# Patient Record
Sex: Male | Born: 1950
Health system: Southern US, Community
[De-identification: ages and names within clinical notes are randomized; demographics above are authoritative.]

## PROBLEM LIST (undated history)

## (undated) DIAGNOSIS — C7931 Secondary malignant neoplasm of brain: Secondary | ICD-10-CM

## (undated) DIAGNOSIS — I10 Essential (primary) hypertension: Secondary | ICD-10-CM

## (undated) DIAGNOSIS — G454 Transient global amnesia: Secondary | ICD-10-CM

## (undated) DIAGNOSIS — C3490 Malignant neoplasm of unspecified part of unspecified bronchus or lung: Secondary | ICD-10-CM

## (undated) DIAGNOSIS — R030 Elevated blood-pressure reading, without diagnosis of hypertension: Secondary | ICD-10-CM

## (undated) DIAGNOSIS — N529 Male erectile dysfunction, unspecified: Secondary | ICD-10-CM

## (undated) DIAGNOSIS — H43812 Vitreous degeneration, left eye: Secondary | ICD-10-CM

## (undated) DIAGNOSIS — G43909 Migraine, unspecified, not intractable, without status migrainosus: Secondary | ICD-10-CM

## (undated) DIAGNOSIS — I82409 Acute embolism and thrombosis of unspecified deep veins of unspecified lower extremity: Secondary | ICD-10-CM

## (undated) DIAGNOSIS — Z961 Presence of intraocular lens: Secondary | ICD-10-CM

## (undated) DIAGNOSIS — E785 Hyperlipidemia, unspecified: Secondary | ICD-10-CM

## (undated) DIAGNOSIS — H27112 Subluxation of lens, left eye: Secondary | ICD-10-CM

## (undated) DIAGNOSIS — R0600 Dyspnea, unspecified: Secondary | ICD-10-CM

## (undated) HISTORY — DX: Hyperlipidemia, unspecified: E78.5

## (undated) HISTORY — DX: Vitreous degeneration, left eye: H43.812

## (undated) HISTORY — DX: Male erectile dysfunction, unspecified: N52.9

## (undated) HISTORY — PX: OTHER SURGICAL HISTORY: SHX169

## (undated) HISTORY — DX: Presence of intraocular lens: Z96.1

## (undated) HISTORY — DX: Subluxation of lens, left eye: H27.112

## (undated) HISTORY — PX: LUMBAR LAMINECTOMY: SHX95

## (undated) HISTORY — DX: Transient global amnesia: G45.4

## (undated) HISTORY — PX: CATARACT EXTRACTION, BILATERAL: SHX1313

## (undated) HISTORY — DX: Elevated blood-pressure reading, without diagnosis of hypertension: R03.0

## (undated) HISTORY — PX: HERNIA REPAIR: SHX51

## (undated) HISTORY — PX: FEMORAL HERNIA REPAIR: SHX632

## (undated) HISTORY — DX: Migraine, unspecified, not intractable, without status migrainosus: G43.909

---

## 2014-08-24 HISTORY — PX: COLONOSCOPY: SHX174

## 2015-08-25 DIAGNOSIS — G454 Transient global amnesia: Secondary | ICD-10-CM

## 2015-08-25 HISTORY — DX: Transient global amnesia: G45.4

## 2015-10-17 ENCOUNTER — Encounter: Payer: Self-pay | Admitting: Cardiology

## 2015-10-18 ENCOUNTER — Encounter: Payer: Self-pay | Admitting: Cardiology

## 2016-08-24 HISTORY — PX: OTHER SURGICAL HISTORY: SHX169

## 2016-09-15 DIAGNOSIS — Z961 Presence of intraocular lens: Secondary | ICD-10-CM | POA: Diagnosis not present

## 2016-09-15 DIAGNOSIS — H43812 Vitreous degeneration, left eye: Secondary | ICD-10-CM | POA: Diagnosis not present

## 2016-09-15 DIAGNOSIS — H04123 Dry eye syndrome of bilateral lacrimal glands: Secondary | ICD-10-CM | POA: Diagnosis not present

## 2016-10-08 ENCOUNTER — Encounter: Payer: Self-pay | Admitting: Cardiology

## 2016-10-08 DIAGNOSIS — G454 Transient global amnesia: Secondary | ICD-10-CM | POA: Diagnosis not present

## 2016-10-08 DIAGNOSIS — Z136 Encounter for screening for cardiovascular disorders: Secondary | ICD-10-CM | POA: Diagnosis not present

## 2016-10-08 DIAGNOSIS — E785 Hyperlipidemia, unspecified: Secondary | ICD-10-CM | POA: Diagnosis not present

## 2016-10-08 DIAGNOSIS — R030 Elevated blood-pressure reading, without diagnosis of hypertension: Secondary | ICD-10-CM | POA: Diagnosis not present

## 2016-10-31 ENCOUNTER — Inpatient Hospital Stay
Admit: 2016-10-31 | Discharge: 2016-10-31 | Disposition: A | Discharge status: Home / Self Care | Attending: Emergency Medicine

## 2016-10-31 DIAGNOSIS — H539 Unspecified visual disturbance: Secondary | ICD-10-CM | POA: Diagnosis not present

## 2016-10-31 DIAGNOSIS — H538 Other visual disturbances: Secondary | ICD-10-CM | POA: Diagnosis not present

## 2016-10-31 NOTE — Discharge Instructions (Signed)
-   As discussed, take an aspirin when you get home

## 2016-10-31 NOTE — ED Provider Notes (Signed)
[facility]  eMERGENCY dEPARTMENT eNCOUnter   Physician Attestation    Pt Name: Donald Bruce  MRN: [id number]  Birthdate 1951/04/30  Date of evaluation: 10/31/16        Physician Note:    I have personally performed and/or participated in the history, exam and medical decision making and agree with all pertinent clinical information.  I have also reviewed and agree with the past medical, family and social history unless otherwise noted.    I have personally performed a face to face diagnostic evaluation on this patient. I have reviewed the mid-level's findings and agree.      History: Patient presents with painless sudden impaired vision in the right eye.  Remote history of intraocular lens placement.  Also remote history of retinal detachment with surgical repair.    Physical Exam   Constitutional: He is oriented to person, place, and time. He appears well-developed and well-nourished.   HENT:   Head: Normocephalic and atraumatic.   Right Ear: External ear normal.   Left Ear: External ear normal.   Eyes: Conjunctivae are normal. Right eye exhibits no discharge. Left eye exhibits no discharge. No scleral icterus.   Patient has a mobile vacillating right iris.  He does not have a Page Spiro.  In the dilated state he does have some component of anisocoria with the left pupil being about 6 mm on the right at 4.  Visual acuity is 20/200 in the right and 20/13 in the left.  I am able to visualize the fundi.  I do not see evidence of central retinal artery occlusion or large central retinal vein occlusion.  This is done on an on dilated exam.  I also did a bedside ultrasound.  The intraocular lens appears to be intact.  I did not see any obvious signs of a retinal detachment.   Neck: Normal range of motion. No tracheal deviation present.   Pulmonary/Chest: Effort normal. No stridor. No respiratory distress.   Musculoskeletal: Normal range of motion.   Neurological: He is alert and oriented to person, place,  and time. He has normal strength. No cranial nerve deficit or sensory deficit. Coordination and gait normal. GCS eye subscore is 4. GCS verbal subscore is 5. GCS motor subscore is 6.   Skin: Skin is warm and dry. He is not diaphoretic.   Psychiatric: He has a normal mood and affect. His behavior is normal.   Nursing note and vitals reviewed.    Case discussed with Dr.Arar of ophthalmology.  He felt comfortable starting the patient on some aspirin and he will see the patient tomorrow at 12 noon.    1. Visual disturbance of one eye    2. Blurred vision, right eye          DISPOSITION/PLAN  PATIENT REFERRED TO:  Hisham Kenney Houseman, MD  [address]  [place] [address]  [phone]    Go in 1 day  Dr Lajuana Ripple will call you to arrange for an appointment tomorrow, around noon sometime.    [facility]  [address] [place] [address]  [phone]    If symptoms worsen    DISCHARGE MEDICATIONS:  There are no discharge medications for this patient.        Synetta Fail, MD          Synetta Fail, MD  10/31/16 [phone]

## 2016-10-31 NOTE — ED Provider Notes (Signed)
[facility] EMERGENCY DEPT  eMERGENCY dEPARTMENT eNCOUnter        Pt Name: Donald Bruce  MRN: [id number]  Birthdate 09-10-1950  Date of evaluation: 10/31/2016  Provider: Burman Freestone, PA  [facility]P: [facility]  ED Attending: Dr. Hazle Nordmann    CHIEF COMPLAINT       Chief Complaint   Patient presents with   . Loss of Vision     right eye x 1 hour. hx of cataracts. states it feels "like the lense slipped out". no injury.        HISTORY OF PRESENT ILLNESS   (Location/Symptom, Timing/Onset, Context/Setting, Quality, Duration, Modifying Factors, Severity)  Note limiting factors.     Donald Bruce is a 66 y.o. male who presents to the ED today with complaints of loss of vision in his right eye. He reports that it happened rather suddenly, about an hours ago. He states its not a total loss, but loss of the "sharpness". He can still see lights, shapes, colors and movement. It Does not hurt.  He states that he feels almost like his cataract lens has slipped out of place.  He denies any injuries, falls, or traumas.  He states he has had multiple procedures done to the side.  He states he normally follows with an ophthalmologist in [place], as he is here on business and does not reside in [place].  His last appointment with the ophthalmologist was about a month ago.  He has no further complaints at this time.    Nursing Notes were all reviewed and agreed with or any disagreements were addressed  in the HPI.    REVIEW OF SYSTEMS    (2-9 systems for level 4, 10 or more for level 5)     Review of Systems   Constitutional: Negative for chills and fever.   Eyes: Positive for visual disturbance (right eye, blurred). Negative for photophobia, pain, discharge and redness.   Respiratory: Negative for cough and shortness of breath.    Cardiovascular: Negative for chest pain and palpitations.   Gastrointestinal: Negative for nausea and vomiting.   Neurological: Negative for dizziness, light-headedness and headaches.       Positives  and Pertinent negatives as per HPI.  Except as noted above in the ROS, all other systems were reviewed and negative.       PAST MEDICAL HISTORY     Past Medical History:   Diagnosis Date   . Cataracts, bilateral          SURGICAL HISTORY     History reviewed. No pertinent surgical history.      CURRENT MEDICATIONS       There are no discharge medications for this patient.        ALLERGIES     Patient has no known allergies.    FAMILY HISTORY     History reviewed. No pertinent family history.       SOCIAL HISTORY       Social History     Social History   . Marital status: Married     Spouse name: N/A   . Number of children: N/A   . Years of education: N/A     Social History Main Topics   . Smoking status: Never Smoker   . Smokeless tobacco: Never Used   . Alcohol use No   . Drug use: No   . Sexual activity: Not Asked     Other Topics Concern   . None  Social History Narrative   . None       SCREENINGS             PHYSICAL EXAM    (up to 7 for level 4, 8 or more for level 5)     ED Triage Vitals [10/31/16 1337]   BP Temp Temp Source Pulse Resp SpO2 Height Weight   (!) 160/84 97.1 F (36.2 C) Oral 82 14 99 % 6\' 1"  (1.854 m) 200 lb 2.8 oz (90.8 kg)       Physical Exam   Constitutional: He is oriented to person, place, and time. He appears well-developed and well-nourished. He is cooperative.  Non-toxic appearance. He does not appear ill. No distress.   HENT:   Head: Normocephalic and atraumatic.   Right Ear: External ear normal.   Left Ear: External ear normal.   Mouth/Throat: Oropharynx is clear and moist.   Eyes: Conjunctivae and EOM are normal. Pupils are equal, round, and reactive to light. Right eye exhibits no discharge. Left eye exhibits no discharge. Right conjunctiva has no hemorrhage. Left conjunctiva has no hemorrhage. Right eye exhibits normal extraocular motion and no nystagmus. Left eye exhibits normal extraocular motion and no nystagmus.   Fundoscopic exam:       The right eye shows no hemorrhage. The  right eye shows red reflex.        The left eye shows no hemorrhage. The left eye shows red reflex.   Eye US performed by Dr. Pricilla Holm. Right eye with shimmer with movement, appears different from classic shimmer associated with cataracts.   Pulmonary/Chest: Effort normal. No respiratory distress.   Neurological: He is alert and oriented to person, place, and time. He has normal strength. No cranial nerve deficit. Coordination and gait normal. GCS eye subscore is 4. GCS verbal subscore is 5. GCS motor subscore is 6.   Skin: Skin is warm and dry. He is not diaphoretic.   Psychiatric: He has a normal mood and affect. His behavior is normal. Thought content normal.   Nursing note and vitals reviewed.      DIAGNOSTIC RESULTS   LABS:    Labs Reviewed - No data to display    All other labs were within normal range or not returned as of this dictation.    EKG: All EKG's are interpreted by the Emergency Department Physician who either signs or Co-signs this chart in the absence of a cardiologist.  Please see their note for interpretation of EKG.      RADIOLOGY:   Non-plain film images such as CT, Ultrasound and MRI are read by the radiologist. Plain radiographic images are visualized and preliminarily interpreted by the  ED Provider with the below findings:    Interpretation per the Radiologist below, if available at the time of this note:    No orders to display     No results found.      PROCEDURES   Unless otherwise noted below, none     Procedures    CRITICAL CARE TIME   N/A    CONSULTS:  IP CONSULT TO OPHTHALMOLOGY      EMERGENCY DEPARTMENT COURSE and DIFFERENTIAL DIAGNOSIS/MDM:   Vitals:    Vitals:    10/31/16 1337   BP: (!) 160/84   Pulse: 82   Resp: 14   Temp: 97.1 F (36.2 C)   TempSrc: Oral   SpO2: 99%   Weight: 200 lb 2.8 oz (90.8 kg)   Height: 6\' 1"  (1.854 m)  Patient was given the following medications:  Medications - No data to display    The patient remained stable during his stay in the emergency  department.  We have low suspicion for neurological deficit, as the patient has no focal deficits, and the right eye is blurred.  Dr. Pricilla Holm discussed the case with the on-call ophthalmologist at [facility] (Dr Lajuana Ripple), who will call the patient and arrange to see him tomorrow around noon.  He advised the patient to take an aspirin, I did offer To give him an aspirin in the emergency department, but the patient did refuse, and said that he would take the aspirin at home.  He was given very strict return precautions.  He verbalized understanding, agreed with plan, was discharged home in stable condition.     The patient tolerated their visit well.  They were seen and evaluated by the attending physician who agreed with the assessment and plan.  The patient and / or the family were informed of the results of any tests, a time was given to answer questions, a plan was proposed and they agreed with plan.        FINAL IMPRESSION      1. Visual disturbance of one eye    2. Blurred vision, right eye          DISPOSITION/PLAN   DISPOSITION Decision To Discharge 10/31/2016 03:12:49 PM      PATIENT REFERRED TO:  Cecilio Asper, MD  [address]  [place] [address]  [phone]    Go in 1 day  Dr Lajuana Ripple will call you to arrange for an appointment tomorrow, around noon sometime.    [facility]  [address] [place] [address]  [phone]    If symptoms worsen      DISCHARGE MEDICATIONS:  There are no discharge medications for this patient.      DISCONTINUED MEDICATIONS:  There are no discharge medications for this patient.             (Please note that portions of this note were completed with a voice recognition program.  Efforts were made to edit the dictations but occasionally words are mis-transcribed.)    Burman Freestone, PA (electronically signed)            Burman Freestone, [place]  11/01/16 0025

## 2016-11-01 DIAGNOSIS — H27111 Subluxation of lens, right eye: Secondary | ICD-10-CM | POA: Diagnosis not present

## 2016-11-05 DIAGNOSIS — T8522XA Displacement of intraocular lens, initial encounter: Secondary | ICD-10-CM | POA: Diagnosis not present

## 2016-11-18 DIAGNOSIS — T8522XA Displacement of intraocular lens, initial encounter: Secondary | ICD-10-CM | POA: Diagnosis not present

## 2016-11-18 DIAGNOSIS — H33011 Retinal detachment with single break, right eye: Secondary | ICD-10-CM | POA: Diagnosis not present

## 2016-11-20 DIAGNOSIS — H27131 Posterior dislocation of lens, right eye: Secondary | ICD-10-CM | POA: Diagnosis not present

## 2016-11-27 DIAGNOSIS — Z125 Encounter for screening for malignant neoplasm of prostate: Secondary | ICD-10-CM | POA: Diagnosis not present

## 2016-12-14 DIAGNOSIS — Z9889 Other specified postprocedural states: Secondary | ICD-10-CM | POA: Diagnosis not present

## 2016-12-14 DIAGNOSIS — T8522XA Displacement of intraocular lens, initial encounter: Secondary | ICD-10-CM | POA: Diagnosis not present

## 2016-12-14 DIAGNOSIS — H21531 Iridodialysis, right eye: Secondary | ICD-10-CM | POA: Diagnosis not present

## 2016-12-14 DIAGNOSIS — H27131 Posterior dislocation of lens, right eye: Secondary | ICD-10-CM | POA: Diagnosis not present

## 2016-12-14 DIAGNOSIS — H4311 Vitreous hemorrhage, right eye: Secondary | ICD-10-CM | POA: Diagnosis not present

## 2016-12-15 DIAGNOSIS — H4311 Vitreous hemorrhage, right eye: Secondary | ICD-10-CM | POA: Diagnosis not present

## 2017-02-19 DIAGNOSIS — Z6826 Body mass index (BMI) 26.0-26.9, adult: Secondary | ICD-10-CM | POA: Diagnosis not present

## 2017-02-19 DIAGNOSIS — H9211 Otorrhea, right ear: Secondary | ICD-10-CM | POA: Diagnosis not present

## 2017-03-08 DIAGNOSIS — Z203 Contact with and (suspected) exposure to rabies: Secondary | ICD-10-CM | POA: Diagnosis not present

## 2017-03-08 DIAGNOSIS — Z2914 Encounter for prophylactic rabies immune globin: Secondary | ICD-10-CM | POA: Diagnosis not present

## 2017-03-08 DIAGNOSIS — W5581XA Bitten by other mammals, initial encounter: Secondary | ICD-10-CM | POA: Diagnosis not present

## 2017-03-08 DIAGNOSIS — T148XXA Other injury of unspecified body region, initial encounter: Secondary | ICD-10-CM | POA: Diagnosis not present

## 2017-03-08 DIAGNOSIS — S61052A Open bite of left thumb without damage to nail, initial encounter: Secondary | ICD-10-CM | POA: Diagnosis not present

## 2017-03-08 DIAGNOSIS — Z6827 Body mass index (BMI) 27.0-27.9, adult: Secondary | ICD-10-CM | POA: Diagnosis not present

## 2017-03-08 DIAGNOSIS — Z139 Encounter for screening, unspecified: Secondary | ICD-10-CM | POA: Diagnosis not present

## 2017-03-11 DIAGNOSIS — Z23 Encounter for immunization: Secondary | ICD-10-CM | POA: Diagnosis not present

## 2017-03-11 DIAGNOSIS — Z203 Contact with and (suspected) exposure to rabies: Secondary | ICD-10-CM | POA: Diagnosis not present

## 2017-03-15 DIAGNOSIS — Z203 Contact with and (suspected) exposure to rabies: Secondary | ICD-10-CM | POA: Diagnosis not present

## 2017-03-15 DIAGNOSIS — Z23 Encounter for immunization: Secondary | ICD-10-CM | POA: Diagnosis not present

## 2017-03-22 DIAGNOSIS — Z23 Encounter for immunization: Secondary | ICD-10-CM | POA: Diagnosis not present

## 2017-03-22 DIAGNOSIS — Z203 Contact with and (suspected) exposure to rabies: Secondary | ICD-10-CM | POA: Diagnosis not present

## 2017-05-05 DIAGNOSIS — Z23 Encounter for immunization: Secondary | ICD-10-CM | POA: Diagnosis not present

## 2017-05-17 DIAGNOSIS — R05 Cough: Secondary | ICD-10-CM | POA: Diagnosis not present

## 2017-05-24 DIAGNOSIS — R030 Elevated blood-pressure reading, without diagnosis of hypertension: Secondary | ICD-10-CM | POA: Diagnosis not present

## 2017-05-24 DIAGNOSIS — E782 Mixed hyperlipidemia: Secondary | ICD-10-CM | POA: Diagnosis not present

## 2017-05-31 DIAGNOSIS — E782 Mixed hyperlipidemia: Secondary | ICD-10-CM | POA: Diagnosis not present

## 2017-05-31 DIAGNOSIS — G43909 Migraine, unspecified, not intractable, without status migrainosus: Secondary | ICD-10-CM | POA: Diagnosis not present

## 2017-05-31 DIAGNOSIS — Z125 Encounter for screening for malignant neoplasm of prostate: Secondary | ICD-10-CM | POA: Diagnosis not present

## 2017-05-31 DIAGNOSIS — R030 Elevated blood-pressure reading, without diagnosis of hypertension: Secondary | ICD-10-CM | POA: Diagnosis not present

## 2017-07-28 DIAGNOSIS — H33011 Retinal detachment with single break, right eye: Secondary | ICD-10-CM | POA: Diagnosis not present

## 2017-07-28 DIAGNOSIS — Z961 Presence of intraocular lens: Secondary | ICD-10-CM | POA: Diagnosis not present

## 2017-07-28 DIAGNOSIS — H35373 Puckering of macula, bilateral: Secondary | ICD-10-CM | POA: Diagnosis not present

## 2017-07-28 DIAGNOSIS — H4423 Degenerative myopia, bilateral: Secondary | ICD-10-CM | POA: Diagnosis not present

## 2017-07-28 DIAGNOSIS — H4311 Vitreous hemorrhage, right eye: Secondary | ICD-10-CM | POA: Diagnosis not present

## 2017-07-28 DIAGNOSIS — H27131 Posterior dislocation of lens, right eye: Secondary | ICD-10-CM | POA: Diagnosis not present

## 2017-09-21 DIAGNOSIS — Z961 Presence of intraocular lens: Secondary | ICD-10-CM | POA: Diagnosis not present

## 2017-09-21 DIAGNOSIS — H43812 Vitreous degeneration, left eye: Secondary | ICD-10-CM | POA: Diagnosis not present

## 2017-09-21 DIAGNOSIS — Z8669 Personal history of other diseases of the nervous system and sense organs: Secondary | ICD-10-CM | POA: Diagnosis not present

## 2017-09-21 DIAGNOSIS — H04123 Dry eye syndrome of bilateral lacrimal glands: Secondary | ICD-10-CM | POA: Diagnosis not present

## 2017-09-24 HISTORY — PX: OTHER SURGICAL HISTORY: SHX169

## 2017-10-07 ENCOUNTER — Encounter: Payer: Self-pay | Admitting: Cardiology

## 2017-10-07 DIAGNOSIS — E785 Hyperlipidemia, unspecified: Secondary | ICD-10-CM | POA: Diagnosis not present

## 2017-10-07 DIAGNOSIS — G454 Transient global amnesia: Secondary | ICD-10-CM | POA: Diagnosis not present

## 2017-10-07 DIAGNOSIS — R030 Elevated blood-pressure reading, without diagnosis of hypertension: Secondary | ICD-10-CM | POA: Diagnosis not present

## 2017-10-07 DIAGNOSIS — Z136 Encounter for screening for cardiovascular disorders: Secondary | ICD-10-CM | POA: Diagnosis not present

## 2017-11-02 ENCOUNTER — Encounter: Payer: Self-pay | Admitting: Cardiology

## 2017-11-02 ENCOUNTER — Ambulatory Visit (INDEPENDENT_AMBULATORY_CARE_PROVIDER_SITE_OTHER): Payer: Medicare Other | Admitting: Cardiology

## 2017-11-02 DIAGNOSIS — R030 Elevated blood-pressure reading, without diagnosis of hypertension: Secondary | ICD-10-CM | POA: Diagnosis not present

## 2017-11-02 NOTE — Progress Notes (Signed)
PCP: Patient, No Pcp Per  Clinic Note: Chief Complaint  Patient presents with  . New Patient (Initial Visit)    Pt states no Sx.     HPI: Ethan Brown is a 67 y.o. male who is being seen today for the establishment of Cardiology Care in [place] at the request of Ethan Brown, Ethan Hong, MD. --Ethan Brown in [place].  Ethan Brown has been followed by Ethan Brown in [place], [place] for cardiology follow-up annually after he had an episode of Transient Global Amnesia he is not really sure what all was done.  He thinks he probably had an echocardiogram and may be some other studies done but (TGA) --has not really had cardiac complaints.  Recent Hospitalizations: No hospitalizations, but he has had multiple eye procedures   Studies Personally Reviewed - (if available, images/films reviewed: From Epic Chart or Care Everywhere)  none available  Interval History: Ethan Brown presents here today overall doing fairly well.  He says that he has been very active and doing very well with exercise but over the last 2-3 years he acknowledges that he is gotten out of his routine.  He has not been watching what he should be eating and not getting out and exercising.  He now has moved down to the [place] area  (essentially retired) and is hoping to try to get back into doing exercise.  He is started doing an exercise regimen and is trying to work on his diet. He indicates he is not had a history of hyperlipidemia (although his pressure today is high.  He says at home it is usually in the 323F systolic.  He does indicate that his pressure does go up and down depending on what he is doing.  Cardiac review of symptoms: Essentially negative No chest pain or shortness of breath with rest or exertion. No PND, orthopnea or edema. No palpitations, lightheadedness, dizziness, weakness or syncope/near syncope. No TIA/amaurosis fugax symptoms. No melena, hematochezia, hematuria, or epstaxis. No claudication.  ROS:  A comprehensive was performed. Review of Systems  Constitutional: Positive for malaise/fatigue (Mostly because he is not very active). Negative for weight loss.  HENT: Negative for congestion and nosebleeds.   Respiratory: Negative for cough, shortness of breath and wheezing.   Gastrointestinal: Positive for heartburn. Negative for abdominal pain, nausea and vomiting.  Genitourinary: Negative for dysuria.  Musculoskeletal: Positive for joint pain (Mild arthritis). Negative for myalgias.  Neurological: Negative for dizziness.  Psychiatric/Behavioral: Negative for depression and memory loss. The patient is not nervous/anxious and does not have insomnia.   All other systems reviewed and are negative.    I have reviewed and (if needed) personally updated the patient's problem list, medications, allergies, past medical and surgical history, social and family history.   Past Medical History:  Diagnosis Date  . Borderline hypertension   . Migraine headache    takes PRN Imitrex  . Pseudophakia   . TGA (transient global amnesia) 2017    Past Surgical History:  Procedure Laterality Date  . Eye surgeries     , Lens attachment.  Vitrectomy  . FEMORAL HERNIA REPAIR      No outpatient medications have been marked as taking for the 11/02/17 encounter (Office Visit) with Ethan Man, MD.  --He does not take any medications.  No Known Allergies  Social History   Tobacco Use  . Smoking status: Never Smoker  . Smokeless tobacco: Never Used  Substance Use Topics  . Alcohol use: Yes  Alcohol/week: 0.6 oz    Types: 1 Glasses of wine per week  . Drug use: No   Social History   Social History Narrative   Married father of 3, grandfather 7.  Lives with his wife, [name] they recently moved to [place] after living in [place].      He is a retired Engineer, maintenance (IT)   He just restarted an exercise plan at [employer] 3 days a week for 90 minutes at a time.    family history  includes Alcohol abuse in his mother; Heart attack (age of onset: 80) in his maternal grandfather.  Wt Readings from Last 3 Encounters:  11/02/17 200 lb 3.2 oz (90.8 kg)    PHYSICAL EXAM BP (!) 149/87 (BP Location: Right Arm)   Pulse 62   Ht 6' (1.829 m)   Wt 200 lb 3.2 oz (90.8 kg)   BMI 27.15 kg/m  Physical Exam  Constitutional: He is oriented to person, place, and time. He appears well-developed and well-nourished. No distress.  HENT:  Head: Normocephalic and atraumatic.  Mouth/Throat: No oropharyngeal exudate.  Eyes: Conjunctivae and EOM are normal. Pupils are equal, round, and reactive to light.  Neck: Normal range of motion. Neck supple. No hepatojugular reflux and no JVD present. Carotid bruit is not present. No tracheal deviation present. No thyromegaly present.  Cardiovascular: Normal rate, regular rhythm, normal heart sounds and intact distal pulses.  No extrasystoles are present. PMI is not displaced. Exam reveals no gallop and no friction rub.  No murmur heard. Pulmonary/Chest: Effort normal and breath sounds normal. No respiratory distress. He has no wheezes. He has no rales.  Abdominal: Soft. Bowel sounds are normal. He exhibits no distension. There is no tenderness. There is no rebound.  Musculoskeletal: Normal range of motion. He exhibits no edema.  Neurological: He is alert and oriented to person, place, and time. No cranial nerve deficit.  Skin: Skin is warm and dry. No erythema.  Psychiatric: He has a normal mood and affect. His behavior is normal. Judgment and thought content normal.  Nursing note and vitals reviewed.    Adult ECG Report  Rate: 765 ;  Rhythm: normal sinus rhythm, premature ventricular contractions (PVC) and LAFB (-66).  Otherwise normal intervals and durations.;   Narrative Interpretation: Otherwise normal EKG.   Other studies Reviewed: Additional studies/ records that were reviewed today include:  Recent Labs: None   ASSESSMENT /  PLAN:  I am really unsure as to why Ethan Brown has been following up with a cardiologist -- I will need to get his records from Ethan Brown in order to better understand.  Depending on what his records show, I think he can probably just see him annually.  He does not seem to be having that much the way of any cardiac symptoms or cardiac risk factors. Honestly if there is no clear indication for him to follow-up with cardiology after next visit, he can probably just see his PCP for general care.  Problem List Items Addressed This Visit    Borderline hypertension    His blood pressure is quite high today, but he tells me that usually at home and is much better than this.  We can continue to follow. He is not on any medications.         Current medicines are reviewed at length with the patient today. (+/- concerns) none The following changes have been made: None  Patient Instructions  NO CHANGE WITH CURRENT MEDICATIONS   WILL  OBTAIN RECORDS FROM PREVIOUS DR [facility],    Your physician wants you to follow-up in [place].You will receive a reminder letter in the mail two months in advance. If you don't receive a letter, please call our office to schedule the follow-up appointment.       Studies Ordered:   No orders of the defined types were placed in this encounter.     Glenetta Hew, M.D., M.S. Interventional Cardiologist   Pager # [phone] Phone # [phone] [address] [place], [place] [address]   Thank you for choosing [facility] at [facility]!!

## 2017-11-02 NOTE — Patient Instructions (Addendum)
NO CHANGE WITH CURRENT MEDICATIONS   WILL OBTAIN RECORDS FROM [facility]VIOUS DR [facility],    Your physician wants you to follow-up in [place].You will receive a reminder letter in the mail two months in advance. If you don't receive a letter, please call our office to schedule the follow-up appointment.

## 2017-11-04 ENCOUNTER — Encounter: Payer: Self-pay | Admitting: Cardiology

## 2017-11-04 DIAGNOSIS — R030 Elevated blood-pressure reading, without diagnosis of hypertension: Secondary | ICD-10-CM | POA: Insufficient documentation

## 2017-11-04 NOTE — Assessment & Plan Note (Signed)
His blood pressure is quite high today, but he tells me that usually at home and is much better than this.  We can continue to follow. He is not on any medications.

## 2017-12-13 ENCOUNTER — Encounter: Payer: Self-pay | Admitting: Cardiology

## 2018-04-12 DIAGNOSIS — Z0000 Encounter for general adult medical examination without abnormal findings: Secondary | ICD-10-CM | POA: Diagnosis not present

## 2018-05-21 DIAGNOSIS — Z23 Encounter for immunization: Secondary | ICD-10-CM | POA: Diagnosis not present

## 2018-06-14 ENCOUNTER — Telehealth: Payer: Self-pay | Admitting: Family Medicine

## 2018-06-14 ENCOUNTER — Encounter: Payer: Self-pay | Admitting: Family Medicine

## 2018-06-14 ENCOUNTER — Ambulatory Visit (INDEPENDENT_AMBULATORY_CARE_PROVIDER_SITE_OTHER): Payer: Medicare Other | Admitting: Family Medicine

## 2018-06-14 VITALS — BP 140/82 | HR 70 | Temp 98.8°F | Ht 72.0 in | Wt 200.2 lb

## 2018-06-14 DIAGNOSIS — R030 Elevated blood-pressure reading, without diagnosis of hypertension: Secondary | ICD-10-CM

## 2018-06-14 DIAGNOSIS — H33302 Unspecified retinal break, left eye: Secondary | ICD-10-CM

## 2018-06-14 DIAGNOSIS — G43909 Migraine, unspecified, not intractable, without status migrainosus: Secondary | ICD-10-CM | POA: Insufficient documentation

## 2018-06-14 MED ORDER — SUMATRIPTAN SUCCINATE 50 MG PO TABS: 50.0000 mg | ORAL_TABLET | Freq: Once | ORAL | 11 refills | Status: DC

## 2018-06-14 NOTE — Telephone Encounter (Signed)
Dr. Sarajane Jews please advise of refill of the medication.  Thanks

## 2018-06-14 NOTE — Telephone Encounter (Signed)
Copied from [place] [id number]. Topic: Quick Communication - Rx Refill/Question >> Jun 14, 2018  2:10 PM Margot Ables wrote: Medication: sildenafil 20mg  tablets - pt takes 1 prn Pt had from previous doctor and forgot to ask today - what he has is expired Has the patient contacted their pharmacy? No - needs new RX - new to area Preferred Pharmacy (with phone number or street name): [id number] - [place], [place]. AT [address] [phone] (Phone) [phone] (Fax)

## 2018-06-14 NOTE — Progress Notes (Signed)
   Subjective:    Patient ID: Ethan Brown, male    DOB: October 26, 1950, 67 y.o.   MRN: [id number]  HPI Here to establish with [place] for primary care after moving to [place] from [place] earlier this year. He has established with Dr. Glenetta Hew for Cardiology care. He had an episode of transient global amnesia in 2016 and no etiology was found. He has felt fine ever since. He had a normal cardiac stress test and ECHO in 2018.  He saw Dr. Ellyn Hack in March. He does have occasional migraines and he gets prompt relief with Sumatriptan tablets. He has a hx of borderline high BP's but he has never been given medications for this. He does exercise at his gym 3 days a week. He sees his Ophthalmologist frequently for a number of eye issues. He has had vitrectomy repair and a retinal detachment repair on the left, and he has had a lens replacement (after a dislocation) on the right. He still has significant vision loss in the left eye but he seems to deal with it well. His lipid panels have been stable over the years, and he has always had normal glucose readings (despite a family hx of diabetes). His last complete well exam was in October 2018. He had a [employer] screening done on 04-12-18 which was normal except for showing some mild plaque in both carotid arteries. 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    Review of Systems  Constitutional: Negative.   Eyes: Positive for visual disturbance.  Respiratory: Negative.   Cardiovascular: Negative.   Neurological: Positive for headaches. Negative for dizziness, tremors, seizures, syncope, facial asymmetry, speech difficulty, weakness, light-headedness and numbness.       Objective:   Physical Exam  Constitutional: He is oriented to person, place, and time. He appears well-developed and well-nourished.  Neck: No thyromegaly present.  Cardiovascular: Normal rate, regular rhythm, normal heart sounds and intact distal  pulses.  Occasional ectopy   Pulmonary/Chest: Effort normal and breath sounds normal. No stridor. No respiratory distress. He has no wheezes. He has no rales.  Lymphadenopathy:    He has no cervical adenopathy.  Neurological: He is alert and oriented to person, place, and time.          Assessment & Plan:  Intro visit to establish with [place]. He seems to be doing well. He has already had both pneumococcal vaccines and a flu shot for this year. He will return in a few weeks for a well exam and fasting labs. Alysia Penna, MD

## 2018-06-15 MED ORDER — SILDENAFIL CITRATE 20 MG PO TABS: ORAL_TABLET | ORAL | 11 refills | Status: DC

## 2018-06-15 NOTE — Telephone Encounter (Signed)
rx has been sent to the pharmacy and pt is aware

## 2018-06-15 NOTE — Telephone Encounter (Signed)
Call in Sildenafil 20 mg to take prn, #10 with 11 rf

## 2018-07-04 ENCOUNTER — Telehealth: Payer: Self-pay | Admitting: *Deleted

## 2018-07-04 NOTE — Telephone Encounter (Signed)
Prior auth for Sildenafil Citrate 20mg  sent to [email or web address]-key [id number].

## 2018-07-06 NOTE — Telephone Encounter (Signed)
Fax received from [facility] stating the request was denied and this was given to Dr Barbie Banner asst.

## 2018-07-06 NOTE — Telephone Encounter (Signed)
This fax has been placed in the green folder for review.  Please advise. thanks

## 2018-07-08 NOTE — Telephone Encounter (Signed)
He will just have to pay cash for it

## 2018-07-11 NOTE — Telephone Encounter (Signed)
Called and spoke with pt and he stated that he used the good rx to cover the cost of the medication.

## 2018-08-30 ENCOUNTER — Ambulatory Visit (INDEPENDENT_AMBULATORY_CARE_PROVIDER_SITE_OTHER): Payer: Medicare Other | Admitting: Family Medicine

## 2018-08-30 ENCOUNTER — Encounter: Payer: Self-pay | Admitting: Family Medicine

## 2018-08-30 VITALS — BP 122/82 | HR 72 | Temp 97.7°F | Ht 72.0 in | Wt 202.1 lb

## 2018-08-30 DIAGNOSIS — R972 Elevated prostate specific antigen [PSA]: Secondary | ICD-10-CM | POA: Diagnosis not present

## 2018-08-30 DIAGNOSIS — G629 Polyneuropathy, unspecified: Secondary | ICD-10-CM | POA: Diagnosis not present

## 2018-08-30 DIAGNOSIS — N401 Enlarged prostate with lower urinary tract symptoms: Secondary | ICD-10-CM | POA: Diagnosis not present

## 2018-08-30 DIAGNOSIS — H539 Unspecified visual disturbance: Secondary | ICD-10-CM | POA: Diagnosis not present

## 2018-08-30 DIAGNOSIS — N138 Other obstructive and reflux uropathy: Secondary | ICD-10-CM | POA: Diagnosis not present

## 2018-08-30 DIAGNOSIS — E785 Hyperlipidemia, unspecified: Secondary | ICD-10-CM | POA: Diagnosis not present

## 2018-08-30 DIAGNOSIS — R030 Elevated blood-pressure reading, without diagnosis of hypertension: Secondary | ICD-10-CM

## 2018-08-30 DIAGNOSIS — R05 Cough: Secondary | ICD-10-CM

## 2018-08-30 DIAGNOSIS — R059 Cough, unspecified: Secondary | ICD-10-CM

## 2018-08-30 LAB — BASIC METABOLIC PANEL
BUN: 18 mg/dL (ref 6–23)
CHLORIDE: 105 meq/L (ref 96–112)
CO2: 30 meq/L (ref 19–32)
CREATININE: 1.1 mg/dL (ref 0.40–1.50)
Calcium: 9.4 mg/dL (ref 8.4–10.5)
GFR: 70.54 mL/min (ref >60.00)
Glucose, Bld: 87 mg/dL (ref 70–99)
Potassium: 5 mEq/L (ref 3.5–5.1)
Sodium: 139 mEq/L (ref 135–145)

## 2018-08-30 LAB — POC URINALSYSI DIPSTICK (AUTOMATED)
Bilirubin, UA: NEGATIVE
GLUCOSE UA: NEGATIVE
Ketones, UA: NEGATIVE
Leukocytes, UA: NEGATIVE
NITRITE UA: NEGATIVE
PH UA: 6 (ref 5.0–8.0)
Protein, UA: NEGATIVE
RBC UA: NEGATIVE
SPEC GRAV UA: 1.02 (ref 1.010–1.025)
UROBILINOGEN UA: 0.2 U/dL

## 2018-08-30 LAB — CBC WITH DIFFERENTIAL/PLATELET
BASOS ABS: 0 10*3/uL (ref 0.0–0.1)
BASOS PCT: 0.6 % (ref 0.0–3.0)
EOS ABS: 0.1 10*3/uL (ref 0.0–0.7)
Eosinophils Relative: 1.7 % (ref 0.0–5.0)
HEMATOCRIT: 45.5 % (ref 39.0–52.0)
HEMOGLOBIN: 15.7 g/dL (ref 13.0–17.0)
LYMPHS PCT: 27.9 % (ref 12.0–46.0)
Lymphs Abs: 1.3 10*3/uL (ref 0.7–4.0)
MCHC: 34.4 g/dL (ref 30.0–36.0)
MCV: 94.3 fl (ref 78.0–100.0)
MONO ABS: 0.3 10*3/uL (ref 0.1–1.0)
Monocytes Relative: 6.5 % (ref 3.0–12.0)
Neutro Abs: 3 10*3/uL (ref 1.4–7.7)
Neutrophils Relative %: 63.3 % (ref 43.0–77.0)
PLATELETS: 205 10*3/uL (ref 150.0–400.0)
RBC: 4.82 Mil/uL (ref 4.22–5.81)
RDW: 13.2 % (ref 11.5–15.5)
WBC: 4.7 10*3/uL (ref 4.0–10.5)

## 2018-08-30 LAB — HEPATIC FUNCTION PANEL
ALBUMIN: 4.3 g/dL (ref 3.5–5.2)
ALT: 20 U/L (ref 0–53)
AST: 16 U/L (ref 0–37)
Alkaline Phosphatase: 50 U/L (ref 39–117)
BILIRUBIN TOTAL: 0.5 mg/dL (ref 0.2–1.2)
Bilirubin, Direct: 0.1 mg/dL (ref 0.0–0.3)
TOTAL PROTEIN: 6.3 g/dL (ref 6.0–8.3)

## 2018-08-30 LAB — LIPID PANEL
CHOL/HDL RATIO: 4
Cholesterol: 169 mg/dL (ref 0–200)
HDL: 41.4 mg/dL (ref >39.00)
LDL Cholesterol: 104 mg/dL — ABNORMAL HIGH (ref 0–99)
NonHDL: 127.32
Triglycerides: 115 mg/dL (ref 0.0–149.0)
VLDL: 23 mg/dL (ref 0.0–40.0)

## 2018-08-30 LAB — VITAMIN B12: VITAMIN B 12: 168 pg/mL — AB (ref 211–911)

## 2018-08-30 LAB — PSA: PSA: 4.52 ng/mL — AB (ref 0.10–4.00)

## 2018-08-30 LAB — TSH: TSH: 1.12 u[IU]/mL (ref 0.35–4.50)

## 2018-08-30 MED ORDER — HYDROCODONE-HOMATROPINE 5-1.5 MG/5ML PO SYRP: 5.0000 mL | ORAL_SOLUTION | ORAL | 0 refills | Status: DC | PRN

## 2018-08-30 NOTE — Progress Notes (Signed)
Subjective:    Patient ID: Ethan Brown, male    DOB: 1951/05/15, 68 y.o.   MRN: [id number]  HPI Here to discuss several issues. He has been watching his BP and it has been stable. He mentions a dry cough that started about 6 weeks ago. He does not feel sick at all. No fever. He is using Robitussin and Nyquil and Mucinex and Xyzal with no relief. He says that he gets these long spells of coughing several times a year. His last CXR was a few years ago and was normal. He has a number of eye problems and he needs to establish with an ophthalmologist [place]. He sees Dr. Ellyn Hack regularly.    Review of Systems  Constitutional: Negative.   HENT: Negative.   Eyes: Negative.   Respiratory: Positive for cough.   Cardiovascular: Negative.   Gastrointestinal: Negative.   Genitourinary: Negative.   Musculoskeletal: Negative.   Skin: Negative.   Neurological: Negative.   Psychiatric/Behavioral: Negative.        Objective:   Physical Exam Constitutional:      General: He is not in acute distress.    Appearance: He is well-developed. He is not diaphoretic.  HENT:     Head: Normocephalic and atraumatic.     Right Ear: External ear normal.     Left Ear: External ear normal.     Nose: Nose normal.     Mouth/Throat:     Pharynx: No oropharyngeal exudate.  Eyes:     General: No scleral icterus.       Right eye: No discharge.        Left eye: No discharge.     Conjunctiva/sclera: Conjunctivae normal.     Pupils: Pupils are equal, round, and reactive to light.  Neck:     Musculoskeletal: Neck supple.     Thyroid: No thyromegaly.     Vascular: No JVD.     Trachea: No tracheal deviation.  Cardiovascular:     Rate and Rhythm: Normal rate and regular rhythm.     Heart sounds: Normal heart sounds. No murmur. No friction rub. No gallop.   Pulmonary:     Effort: Pulmonary effort is normal. No respiratory distress.     Breath sounds: Normal breath sounds. No wheezing or rales.    Chest:     Chest wall: No tenderness.  Abdominal:     General: Bowel sounds are normal. There is no distension.     Palpations: Abdomen is soft. There is no mass.     Tenderness: There is no abdominal tenderness. There is no guarding or rebound.  Genitourinary:    Penis: Normal. No tenderness.      Prostate: Normal.     Rectum: Normal. Guaiac result negative.  Musculoskeletal: Normal range of motion.        General: No tenderness.  Lymphadenopathy:     Cervical: No cervical adenopathy.  Skin:    General: Skin is warm and dry.     Coloration: Skin is not pale.     Findings: No erythema or rash.  Neurological:     Mental Status: He is alert and oriented to person, place, and time.     Cranial Nerves: No cranial nerve deficit.     Motor: No abnormal muscle tone.     Coordination: Coordination normal.     Deep Tendon Reflexes: Reflexes are normal and symmetric. Reflexes normal.  Psychiatric:        Behavior: Behavior normal.  Thought Content: Thought content normal.        Judgment: Judgment normal.           Assessment & Plan:  His BP is stable. We discussed diet and exercise and I advised him to lose a few pounds. We will refer him to Ophthalmology. Get fasting labs today. His cough may be duet to silent GERD so he will try taking Omeprazole daily for a few weeks.  Alysia Penna, MD

## 2018-09-01 NOTE — Addendum Note (Signed)
Addended by: Alysia Penna A on: 09/01/2018 01:52 PM   Modules accepted: Orders

## 2018-09-08 ENCOUNTER — Telehealth: Payer: Self-pay | Admitting: Family Medicine

## 2018-09-08 ENCOUNTER — Ambulatory Visit (INDEPENDENT_AMBULATORY_CARE_PROVIDER_SITE_OTHER): Payer: Medicare Other | Admitting: *Deleted

## 2018-09-08 DIAGNOSIS — E538 Deficiency of other specified B group vitamins: Secondary | ICD-10-CM | POA: Diagnosis not present

## 2018-09-08 MED ORDER — CYANOCOBALAMIN 1000 MCG/ML IJ SOLN
1000.0000 ug | Freq: Once | INTRAMUSCULAR | Status: AC
  Administered 2018-09-08: 1000 ug via INTRAMUSCULAR

## 2018-09-08 NOTE — Telephone Encounter (Signed)
Most of the time, a low B12 results from the intestinal tract not absorbing B12 properly. This can occur even if adequate amounts are present in the diet

## 2018-09-08 NOTE — Telephone Encounter (Addendum)
Pt came in today for B12 injections and was asking what is causing his low B12 levels. Is it something chemical within his body or is it something that he is causing by not eating properly? Pt states that he and his wife have eaten limited to no meats over the last year and have not been eating adequate portions of all the food groups -- is his B12 deficiency possibly from him not consuming the right things?  Please advise thanks.

## 2018-09-08 NOTE — Progress Notes (Signed)
Per orders of Dr. Sarajane Jews, injection of Vit B12 given by Nyoka Cowden, ASHTYN M. Patient tolerated injection well.

## 2018-09-08 NOTE — Telephone Encounter (Signed)
Called and spoke with pt and he is aware of recs  per Dr. Sarajane Jews

## 2018-09-15 ENCOUNTER — Ambulatory Visit (INDEPENDENT_AMBULATORY_CARE_PROVIDER_SITE_OTHER): Payer: Medicare Other

## 2018-09-15 DIAGNOSIS — E538 Deficiency of other specified B group vitamins: Secondary | ICD-10-CM

## 2018-09-15 MED ORDER — CYANOCOBALAMIN 1000 MCG/ML IJ SOLN
1000.0000 ug | Freq: Once | INTRAMUSCULAR | Status: AC
  Administered 2018-09-15: 1000 ug via INTRAMUSCULAR

## 2018-09-15 NOTE — Progress Notes (Signed)
Per orders of Dr. Fry, injection of B12 given by Kajol Crispen. Patient tolerated injection well.  

## 2018-09-20 DIAGNOSIS — H43392 Other vitreous opacities, left eye: Secondary | ICD-10-CM | POA: Diagnosis not present

## 2018-09-20 DIAGNOSIS — Z961 Presence of intraocular lens: Secondary | ICD-10-CM | POA: Diagnosis not present

## 2018-09-20 DIAGNOSIS — H43393 Other vitreous opacities, bilateral: Secondary | ICD-10-CM | POA: Diagnosis not present

## 2018-09-20 DIAGNOSIS — Z8669 Personal history of other diseases of the nervous system and sense organs: Secondary | ICD-10-CM | POA: Diagnosis not present

## 2018-09-22 ENCOUNTER — Ambulatory Visit (INDEPENDENT_AMBULATORY_CARE_PROVIDER_SITE_OTHER): Payer: Medicare Other | Admitting: *Deleted

## 2018-09-22 DIAGNOSIS — E538 Deficiency of other specified B group vitamins: Secondary | ICD-10-CM

## 2018-09-22 MED ORDER — CYANOCOBALAMIN 1000 MCG/ML IJ SOLN: 1000.0000 ug | Freq: Once | INTRAMUSCULAR | Status: DC

## 2018-09-22 MED ORDER — CYANOCOBALAMIN 1000 MCG/ML IJ SOLN
1000.0000 ug | Freq: Once | INTRAMUSCULAR | Status: AC
  Administered 2018-09-22: 1000 ug via INTRAMUSCULAR

## 2018-09-22 NOTE — Progress Notes (Signed)
Per orders of Dr. Sarajane Jews, injection of B 12 given by Zacarias Pontes. Patient tolerated injection well.

## 2018-09-29 ENCOUNTER — Ambulatory Visit (INDEPENDENT_AMBULATORY_CARE_PROVIDER_SITE_OTHER): Payer: Medicare Other

## 2018-09-29 DIAGNOSIS — E538 Deficiency of other specified B group vitamins: Secondary | ICD-10-CM | POA: Diagnosis not present

## 2018-09-29 MED ORDER — CYANOCOBALAMIN 1000 MCG/ML IJ SOLN
1000.0000 ug | Freq: Once | INTRAMUSCULAR | Status: AC
  Administered 2018-09-29: 1000 ug via INTRAMUSCULAR

## 2018-09-29 NOTE — Progress Notes (Signed)
Per orders of Dr. Fry, injection of B12 given by Nelly Scriven. Patient tolerated injection well.  

## 2018-10-06 ENCOUNTER — Ambulatory Visit (INDEPENDENT_AMBULATORY_CARE_PROVIDER_SITE_OTHER): Payer: Medicare Other | Admitting: *Deleted

## 2018-10-06 DIAGNOSIS — E538 Deficiency of other specified B group vitamins: Secondary | ICD-10-CM | POA: Diagnosis not present

## 2018-10-06 MED ORDER — CYANOCOBALAMIN 1000 MCG/ML IJ SOLN
1000.0000 ug | Freq: Once | INTRAMUSCULAR | Status: AC
  Administered 2018-10-06: 1000 ug via INTRAMUSCULAR

## 2018-10-06 NOTE — Progress Notes (Signed)
.  Per orders of Dr. Sarajane Jews, injection of Vit b12 given by Virl Cagey. Patient tolerated injection well.

## 2018-10-13 ENCOUNTER — Ambulatory Visit (INDEPENDENT_AMBULATORY_CARE_PROVIDER_SITE_OTHER): Payer: Medicare Other | Admitting: *Deleted

## 2018-10-13 DIAGNOSIS — E538 Deficiency of other specified B group vitamins: Secondary | ICD-10-CM | POA: Diagnosis not present

## 2018-10-13 MED ORDER — CYANOCOBALAMIN 1000 MCG/ML IJ SOLN
1000.0000 ug | Freq: Once | INTRAMUSCULAR | Status: AC
  Administered 2018-10-13: 1000 ug via INTRAMUSCULAR

## 2018-10-13 NOTE — Progress Notes (Signed)
Per orders of Dr. Sarajane Jews, injection of Vit B12 given by Nyoka Cowden, Tamitha Norell M. Patient tolerated injection well.

## 2018-10-20 ENCOUNTER — Ambulatory Visit (INDEPENDENT_AMBULATORY_CARE_PROVIDER_SITE_OTHER): Payer: Medicare Other | Admitting: *Deleted

## 2018-10-20 DIAGNOSIS — E538 Deficiency of other specified B group vitamins: Secondary | ICD-10-CM | POA: Diagnosis not present

## 2018-10-20 MED ORDER — CYANOCOBALAMIN 1000 MCG/ML IJ SOLN
1000.0000 ug | Freq: Once | INTRAMUSCULAR | Status: AC
  Administered 2018-10-20: 1000 ug via INTRAMUSCULAR

## 2018-10-20 NOTE — Progress Notes (Signed)
Per orders of Dr. Sarajane Jews, injection of Vit b12 given by Virl Cagey. Patient tolerated injection well.

## 2018-10-26 DIAGNOSIS — N401 Enlarged prostate with lower urinary tract symptoms: Secondary | ICD-10-CM | POA: Diagnosis not present

## 2018-10-26 DIAGNOSIS — R972 Elevated prostate specific antigen [PSA]: Secondary | ICD-10-CM | POA: Diagnosis not present

## 2018-10-26 DIAGNOSIS — R3912 Poor urinary stream: Secondary | ICD-10-CM | POA: Diagnosis not present

## 2018-10-27 ENCOUNTER — Ambulatory Visit (INDEPENDENT_AMBULATORY_CARE_PROVIDER_SITE_OTHER): Payer: Medicare Other | Admitting: *Deleted

## 2018-10-27 DIAGNOSIS — E538 Deficiency of other specified B group vitamins: Secondary | ICD-10-CM | POA: Diagnosis not present

## 2018-10-27 MED ORDER — CYANOCOBALAMIN 1000 MCG/ML IJ SOLN
1000.0000 ug | Freq: Once | INTRAMUSCULAR | Status: AC
  Administered 2018-10-27: 1000 ug via INTRAMUSCULAR

## 2018-10-27 NOTE — Progress Notes (Signed)
Patient here for B-12 injection. B-12 administered and tolerated well.

## 2018-11-03 ENCOUNTER — Other Ambulatory Visit: Payer: Self-pay

## 2018-11-03 ENCOUNTER — Telehealth: Payer: Self-pay | Admitting: *Deleted

## 2018-11-03 ENCOUNTER — Ambulatory Visit (INDEPENDENT_AMBULATORY_CARE_PROVIDER_SITE_OTHER): Payer: Medicare Other | Admitting: *Deleted

## 2018-11-03 DIAGNOSIS — E538 Deficiency of other specified B group vitamins: Secondary | ICD-10-CM | POA: Diagnosis not present

## 2018-11-03 MED ORDER — CYANOCOBALAMIN 1000 MCG/ML IJ SOLN
1000.0000 ug | Freq: Once | INTRAMUSCULAR | Status: AC
  Administered 2018-11-03: 1000 ug via INTRAMUSCULAR

## 2018-11-03 NOTE — Progress Notes (Signed)
Patient in for B-12 injection. Patient tolerated well.

## 2018-11-03 NOTE — Telephone Encounter (Signed)
Patient asked if he can start taking the B-12 injection at home. His wife is a Therapist, sports and is willing to give him the injection so he does not have to come into the office d/t the coronavirus outbreak.

## 2018-11-04 MED ORDER — CYANOCOBALAMIN 1000 MCG/ML IJ SOLN: 1000.0000 ug | Freq: Once | INTRAMUSCULAR | 5 refills | Status: DC

## 2018-11-04 NOTE — Telephone Encounter (Signed)
Yes please call in 90 day supplies of B12 and syringes with 3 rf

## 2018-11-10 ENCOUNTER — Ambulatory Visit: Payer: Medicare Other

## 2018-11-14 DIAGNOSIS — E538 Deficiency of other specified B group vitamins: Secondary | ICD-10-CM | POA: Diagnosis not present

## 2018-11-14 DIAGNOSIS — M5416 Radiculopathy, lumbar region: Secondary | ICD-10-CM | POA: Diagnosis not present

## 2018-11-14 DIAGNOSIS — G609 Hereditary and idiopathic neuropathy, unspecified: Secondary | ICD-10-CM | POA: Diagnosis not present

## 2018-11-17 ENCOUNTER — Ambulatory Visit: Payer: Medicare Other

## 2018-11-18 ENCOUNTER — Ambulatory Visit: Payer: Medicare Other | Admitting: Cardiology

## 2018-11-18 DIAGNOSIS — E538 Deficiency of other specified B group vitamins: Secondary | ICD-10-CM | POA: Diagnosis not present

## 2018-11-23 DIAGNOSIS — E538 Deficiency of other specified B group vitamins: Secondary | ICD-10-CM | POA: Diagnosis not present

## 2018-11-24 ENCOUNTER — Ambulatory Visit (INDEPENDENT_AMBULATORY_CARE_PROVIDER_SITE_OTHER): Payer: Medicare Other | Admitting: Family Medicine

## 2018-11-24 ENCOUNTER — Ambulatory Visit: Payer: Medicare Other

## 2018-11-24 ENCOUNTER — Encounter: Payer: Self-pay | Admitting: Family Medicine

## 2018-11-24 ENCOUNTER — Other Ambulatory Visit: Payer: Self-pay

## 2018-11-24 DIAGNOSIS — G588 Other specified mononeuropathies: Secondary | ICD-10-CM

## 2018-11-24 DIAGNOSIS — E538 Deficiency of other specified B group vitamins: Secondary | ICD-10-CM | POA: Insufficient documentation

## 2018-11-24 DIAGNOSIS — G629 Polyneuropathy, unspecified: Secondary | ICD-10-CM | POA: Insufficient documentation

## 2018-11-24 NOTE — Progress Notes (Signed)
Subjective:    Patient ID: Ethan Brown, male    DOB: [birth year], 67 y.o.   MRN: [id number]  HPI Virtual Visit via Video Note  I connected with the patient on 11/24/18 at  9:30 AM EDT by a video enabled telemedicine application and verified that I am speaking with the correct person using two identifiers.  Location patient: home Location provider:work or home office Persons participating in the virtual visit: patient, provider  I discussed the limitations of evaluation and management by telemedicine and the availability of in person appointments. The patient expressed understanding and agreed to proceed.   HPI: We are following up on a B12 deficiency. We spoke in January and at that time he was having some generalized fatigue and tingling in the hands and feet. Labs revealed a low B12 level at 168. He started taking weekly B12 shots, and he reports that he feels much better. He has more energy and the tingling has totally resolved. He had a level drawn at his brother's office (he is a neurologist in [place]) on 11-18-18 and this is up to 597.    ROS: See pertinent positives and negatives per HPI.  Past Medical History:  Diagnosis Date  . Borderline hypertension   . Borderline systolic hypertension   . Detached vitreous humor, left   . ED (erectile dysfunction) of organic origin   . Hyperlipidemia   . Lens subluxation, left   . Migraine   . Migraine headache    takes PRN Imitrex  . Pseudophakia   . TGA (transient global amnesia) 2017  . Transient global amnesia     Past Surgical History:  Procedure Laterality Date  . CAROTID DOPPLERS  09/2017   Mild non-obstructive disease  . CATARACT EXTRACTION, BILATERAL    . cataract, left  2018  . COLONOSCOPY  2016   clear, repeat in 10 yrs   . Eye surgeries     , Lens attachment.  Vitrectomy  . FEMORAL HERNIA REPAIR    . HERNIA REPAIR    . LUMBAR LAMINECTOMY    . retinal attachment, right    . sclearl buckle,  right    . victrectomy,right      Family History  Problem Relation Age of Onset  . Alcohol abuse Mother   . Diabetes Father   . Hypertension Father   . Prostate cancer Father   . Cancer Father   . Heart attack Maternal Grandfather 78     Current Outpatient Medications:  .  Ascorbic Acid (VITAMIN C) 100 MG tablet, Take by mouth., Disp: , Rfl:  .  B Complex Vitamins (B-COMPLEX/B-12) LIQD, Place under the tongue., Disp: , Rfl:  .  HYDROcodone-homatropine (HYDROMET) 5-1.5 MG/5ML syrup, Take 5 mLs by mouth every 4 (four) hours as needed., Disp: 240 mL, Rfl: 0 .  Multiple Vitamin (MULTIVITAMIN) capsule, Take by mouth., Disp: , Rfl:  .  sildenafil (REVATIO) 20 MG tablet, Take as directed, Disp: 10 tablet, Rfl: 11 .  SUMAtriptan (IMITREX) 50 MG tablet, Take 1 tablet (50 mg total) by mouth once for 1 dose., Disp: 9 tablet, Rfl: 11  EXAM:  VITALS per patient if applicable:  GENERAL: alert, oriented, appears well and in no acute distress  HEENT: atraumatic, conjunttiva clear, no obvious abnormalities on inspection of external nose and ears  NECK: normal movements of the head and neck  LUNGS: on inspection no signs of respiratory distress, breathing rate appears normal, no obvious gross SOB, gasping or wheezing  CV:  no obvious cyanosis  MS: moves all visible extremities without noticeable abnormality  PSYCH/NEURO: pleasant and cooperative, no obvious depression or anxiety, speech and thought processing grossly intact  ASSESSMENT AND PLAN: B12 deficiency, now resolved. We will decrease the B12 shots to once a month, and we will check another level in 6 months.  Alysia Penna, MD  Discussed the following assessment and plan:  No diagnosis found.     I discussed the assessment and treatment plan with the patient. The patient was provided an opportunity to ask questions and all were answered. The patient agreed with the plan and demonstrated an understanding of the instructions.    The patient was advised to call back or seek an in-person evaluation if the symptoms worsen or if the condition fails to improve as anticipated.     Review of Systems     Objective:   Physical Exam        Assessment & Plan:

## 2018-11-28 ENCOUNTER — Telehealth: Payer: Self-pay | Admitting: *Deleted

## 2018-11-28 NOTE — Telephone Encounter (Signed)
Copied from [place] [id number]. Topic: Referral - Status >> Nov 24, 2018 11:12 AM Berneta Levins wrote: Reason for CRM:   Pt called and just wanted PCP to know that he has postponed his referral to [facility], Dr. Gloriann Loan until mid-summer because of COVID concerns.   FYI

## 2018-12-01 ENCOUNTER — Ambulatory Visit: Payer: Medicare Other

## 2018-12-08 ENCOUNTER — Ambulatory Visit: Payer: Medicare Other

## 2018-12-14 HISTORY — PX: TRANSTHORACIC ECHOCARDIOGRAM: SHX275

## 2018-12-15 ENCOUNTER — Ambulatory Visit: Payer: Medicare Other

## 2018-12-22 ENCOUNTER — Ambulatory Visit: Payer: Medicare Other

## 2018-12-29 ENCOUNTER — Ambulatory Visit: Payer: Medicare Other

## 2019-01-12 DIAGNOSIS — E538 Deficiency of other specified B group vitamins: Secondary | ICD-10-CM | POA: Diagnosis not present

## 2019-01-17 ENCOUNTER — Encounter: Payer: Self-pay | Admitting: Family Medicine

## 2019-01-17 ENCOUNTER — Ambulatory Visit (INDEPENDENT_AMBULATORY_CARE_PROVIDER_SITE_OTHER): Payer: Medicare Other | Admitting: Family Medicine

## 2019-01-17 ENCOUNTER — Other Ambulatory Visit: Payer: Self-pay

## 2019-01-17 DIAGNOSIS — R05 Cough: Secondary | ICD-10-CM

## 2019-01-17 DIAGNOSIS — R053 Chronic cough: Secondary | ICD-10-CM

## 2019-01-17 MED ORDER — ALBUTEROL SULFATE HFA 108 (90 BASE) MCG/ACT IN AERS: 2.0000 | INHALATION_SPRAY | RESPIRATORY_TRACT | 2 refills | Status: DC | PRN

## 2019-01-17 NOTE — Progress Notes (Signed)
Subjective:    Patient ID: Ethan Brown, male    DOB: [birth year], 68 y.o.   MRN: [id number]  HPI Virtual Visit via Video Note  I connected with the patient on 01/18/19 at  3:30 PM EDT by a video enabled telemedicine application and verified that I am speaking with the correct person using two identifiers.  Location patient: home Location provider:work or home office Persons participating in the virtual visit: patient, provider  I discussed the limitations of evaluation and management by telemedicine and the availability of in person appointments. The patient expressed understanding and agreed to proceed.   HPI: Here to discuss a dry cough that started 4 months ago. Some days are better than others. He does feel mildly SOB at times but this has not affected any of his activities. One day (on 10-31-18) he experienced some mild sharp pains in the chest when he took deep breaths, but this quickly resolved. No chest pains with activity. He has never taken an ACE inhibitor. He very rarely has heartburn or indigestion. He is not exposed to dust or chemicals. He rarely has any hay fever symptoms like itchy eyes or runny nose or PND. No hx of asthma. He has tried Benadryl with no relief. He does have wheezing with the cough at times. Otherwise no fever, body aches, NVD, ot lack of taste or smell.    ROS: See pertinent positives and negatives per HPI.  Past Medical History:  Diagnosis Date  . Borderline hypertension   . Borderline systolic hypertension   . Detached vitreous humor, left   . ED (erectile dysfunction) of organic origin   . Hyperlipidemia   . Lens subluxation, left   . Migraine   . Migraine headache    takes PRN Imitrex  . Pseudophakia   . TGA (transient global amnesia) 2017  . Transient global amnesia     Past Surgical History:  Procedure Laterality Date  . CAROTID DOPPLERS  09/2017   Mild non-obstructive disease  . CATARACT EXTRACTION, BILATERAL    . cataract,  left  2018  . COLONOSCOPY  2016   clear, repeat in 10 yrs   . Eye surgeries     , Lens attachment.  Vitrectomy  . FEMORAL HERNIA REPAIR    . HERNIA REPAIR    . LUMBAR LAMINECTOMY    . retinal attachment, right    . sclearl buckle, right    . victrectomy,right      Family History  Problem Relation Age of Onset  . Alcohol abuse Mother   . Diabetes Father   . Hypertension Father   . Prostate cancer Father   . Cancer Father   . Heart attack Maternal Grandfather 78     Current Outpatient Medications:  .  Ascorbic Acid (VITAMIN C) 100 MG tablet, Take by mouth., Disp: , Rfl:  .  B Complex Vitamins (B-COMPLEX/B-12) LIQD, Place under the tongue., Disp: , Rfl:  .  HYDROcodone-homatropine (HYDROMET) 5-1.5 MG/5ML syrup, Take 5 mLs by mouth every 4 (four) hours as needed., Disp: 240 mL, Rfl: 0 .  Multiple Vitamin (MULTIVITAMIN) capsule, Take by mouth., Disp: , Rfl:  .  sildenafil (REVATIO) 20 MG tablet, Take as directed, Disp: 10 tablet, Rfl: 11 .  albuterol (VENTOLIN HFA) 108 (90 Base) MCG/ACT inhaler, Inhale 2 puffs into the lungs every 4 (four) hours as needed for wheezing or shortness of breath., Disp: 1 Inhaler, Rfl: 2 .  SUMAtriptan (IMITREX) 50 MG tablet, Take 1 tablet (50  mg total) by mouth once for 1 dose., Disp: 9 tablet, Rfl: 11  EXAM:  VITALS per patient if applicable:  GENERAL: alert, oriented, appears well and in no acute distress  HEENT: atraumatic, conjunttiva clear, no obvious abnormalities on inspection of external nose and ears  NECK: normal movements of the head and neck  LUNGS: on inspection no signs of respiratory distress, breathing rate appears normal, no obvious gross SOB, gasping or wheezing  CV: no obvious cyanosis  MS: moves all visible extremities without noticeable abnormality  PSYCH/NEURO: pleasant and cooperative, no obvious depression or anxiety, speech and thought processing grossly intact  ASSESSMENT AND PLAN: Chronic cough. This sounds like  reactive airway disease. He will try a Ventolin inhaler to use prn. Set up a CXR soon. Recheck in 2 weeks.  Alysia Penna, MD  Discussed the following assessment and plan:  Chronic cough - Plan: DG Chest 2 View     I discussed the assessment and treatment plan with the patient. The patient was provided an opportunity to ask questions and all were answered. The patient agreed with the plan and demonstrated an understanding of the instructions.   The patient was advised to call back or seek an in-person evaluation if the symptoms worsen or if the condition fails to improve as anticipated.     Review of Systems     Objective:   Physical Exam        Assessment & Plan:

## 2019-01-18 ENCOUNTER — Ambulatory Visit (INDEPENDENT_AMBULATORY_CARE_PROVIDER_SITE_OTHER): Payer: Medicare Other

## 2019-01-18 DIAGNOSIS — R053 Chronic cough: Secondary | ICD-10-CM

## 2019-01-18 DIAGNOSIS — R05 Cough: Secondary | ICD-10-CM

## 2019-01-20 ENCOUNTER — Telehealth: Payer: Self-pay | Admitting: *Deleted

## 2019-01-20 NOTE — Telephone Encounter (Signed)
Copied from [place] [id number]. Topic: General - Inquiry >> Jan 20, 2019  9:51 AM Richardo Priest, NT wrote: Reason for CRM: Patient is calling back and requesting a nurse or Dr.Fry call him back and go over his x-ray with him as he does not understand them. Call back is [phone].  Called pt and scheduled him for visit on Monday

## 2019-01-23 ENCOUNTER — Ambulatory Visit (INDEPENDENT_AMBULATORY_CARE_PROVIDER_SITE_OTHER): Payer: Medicare Other | Admitting: Family Medicine

## 2019-01-23 ENCOUNTER — Encounter: Payer: Self-pay | Admitting: Family Medicine

## 2019-01-23 ENCOUNTER — Other Ambulatory Visit: Payer: Self-pay

## 2019-01-23 VITALS — BP 148/80 | HR 85 | Temp 98.0°F | Wt 199.2 lb

## 2019-01-23 DIAGNOSIS — C342 Malignant neoplasm of middle lobe, bronchus or lung: Secondary | ICD-10-CM | POA: Diagnosis not present

## 2019-01-23 DIAGNOSIS — D649 Anemia, unspecified: Secondary | ICD-10-CM

## 2019-01-23 DIAGNOSIS — J90 Pleural effusion, not elsewhere classified: Secondary | ICD-10-CM | POA: Diagnosis not present

## 2019-01-23 LAB — IBC + FERRITIN
Ferritin: 36.2 ng/mL (ref 22.0–322.0)
Iron: 140 ug/dL (ref 42–165)
Saturation Ratios: 45 % (ref 20.0–50.0)
Transferrin: 222 mg/dL (ref 212.0–360.0)

## 2019-01-23 LAB — BASIC METABOLIC PANEL
BUN: 17 mg/dL (ref 6–23)
CO2: 26 mEq/L (ref 19–32)
Calcium: 8.8 mg/dL (ref 8.4–10.5)
Chloride: 104 mEq/L (ref 96–112)
Creatinine, Ser: 0.93 mg/dL (ref 0.40–1.50)
GFR: 80.47 mL/min (ref >60.00)
Glucose, Bld: 81 mg/dL (ref 70–99)
Potassium: 4.8 mEq/L (ref 3.5–5.1)
Sodium: 139 mEq/L (ref 135–145)

## 2019-01-23 NOTE — Progress Notes (Signed)
   Subjective:    Patient ID: Sheriff Rodenberg, male    DOB: [birth year], 68 y.o.   MRN: [id number]  HPI Here to follow up a 4 month hx of a non-productive cough with occasional wheezing and SOB. He had a CXR last week which showed a small right pleural effusion, and this is likely the source of the cough. He had some sharp pains in the chest with deep breathing on 10-31-18 but there has been no pain ever since. He notes that the cough and SOB are more pronounced when he lies on his right side, so he has been lying flat or on the left side when he sleeps.    Review of Systems  Constitutional: Negative.   Respiratory: Positive for cough, shortness of breath and wheezing. Negative for choking.   Cardiovascular: Negative.   Neurological: Negative.        Objective:   Physical Exam Constitutional:      General: He is not in acute distress.    Appearance: Normal appearance.  Cardiovascular:     Rate and Rhythm: Normal rate and regular rhythm.     Pulses: Normal pulses.     Heart sounds: Normal heart sounds.  Pulmonary:     Effort: Pulmonary effort is normal. No respiratory distress.     Breath sounds: No wheezing, rhonchi or rales.     Comments: BS are muffled at the right posterior base  Chest:     Chest wall: No tenderness.  Neurological:     Mental Status: He is alert.           Assessment & Plan:  Chronic cough with a right pleural effusion. We will set up a contrasted chest CT asap.  Alysia Penna, MD

## 2019-01-25 ENCOUNTER — Telehealth: Payer: Self-pay | Admitting: Family Medicine

## 2019-01-25 MED ORDER — HYDROCODONE-HOMATROPINE 5-1.5 MG/5ML PO SYRP: 5.0000 mL | ORAL_SOLUTION | ORAL | 0 refills | Status: DC | PRN

## 2019-01-25 NOTE — Telephone Encounter (Signed)
Done

## 2019-01-25 NOTE — Telephone Encounter (Signed)
Copied from [place] [id number]. Topic: Quick Communication - Rx Refill/Question >> Jan 25, 2019  1:19 PM Sheran Luz wrote: Medication:HYDROcodone-homatropine (HYDROMET) 5-1.5 MG/5ML syrup    Patient is requesting refill of this medication. Patient states his cough has been an ongoing issue and has spoken with Dr. Sarajane Jews about it. Patient was last seen on 6/1.   Preferred Pharmacy (with phone number or street name):[id number] - [place], [place]. [address] [phone] (Phone) [phone] (Fax

## 2019-01-31 ENCOUNTER — Encounter: Payer: Self-pay | Admitting: *Deleted

## 2019-02-09 ENCOUNTER — Ambulatory Visit
Admission: RE | Admit: 2019-02-09 | Discharge: 2019-02-09 | Disposition: A | Discharge status: Home / Self Care | Payer: Medicare Other | Source: Ambulatory Visit | Attending: Family Medicine | Admitting: Family Medicine

## 2019-02-09 ENCOUNTER — Other Ambulatory Visit: Payer: Self-pay

## 2019-02-09 DIAGNOSIS — J90 Pleural effusion, not elsewhere classified: Secondary | ICD-10-CM | POA: Diagnosis not present

## 2019-02-09 DIAGNOSIS — R918 Other nonspecific abnormal finding of lung field: Secondary | ICD-10-CM | POA: Diagnosis not present

## 2019-02-09 DIAGNOSIS — J9811 Atelectasis: Secondary | ICD-10-CM | POA: Diagnosis not present

## 2019-02-09 MED ORDER — IOPAMIDOL (ISOVUE-300) INJECTION 61%
75.0000 mL | Freq: Once | INTRAVENOUS | Status: AC | PRN
  Administered 2019-02-09: 13:00:00 75 mL via INTRAVENOUS

## 2019-02-10 ENCOUNTER — Telehealth: Payer: Self-pay | Admitting: *Deleted

## 2019-02-10 NOTE — Telephone Encounter (Signed)
Ethan Brown calling report- report from CT scan is in chart- verified and will notify PCP

## 2019-02-10 NOTE — Addendum Note (Signed)
Addended by: Alysia Penna A on: 02/10/2019 12:53 PM   Modules accepted: Orders

## 2019-02-10 NOTE — Telephone Encounter (Signed)
Ct scan report in the pts chart.  They called for a stat due to the results of the Ct.

## 2019-02-10 NOTE — Telephone Encounter (Signed)
I went over the result with the patient and have referred him urgently to Pulmonary

## 2019-02-14 ENCOUNTER — Telehealth: Payer: Self-pay

## 2019-02-14 NOTE — Telephone Encounter (Signed)
Virtual Visit Pre-Appointment Phone Call  Ethan Brown, I am calling you today to discuss your upcoming appointment. We are currently trying to limit exposure to the virus that causes COVID-19 by seeing patients at home rather than in the office."  1. "What is the BEST phone number to call the day of the visit?" - include this in appointment notes  2. "Do you have or have access to (through a family member/friend) a smartphone with video capability that we can use for your visit?" a. If yes - list this number in appt notes as "cell" (if different from BEST phone #) and list the appointment type as a VIDEO visit in appointment notes b. If no - list the appointment type as a PHONE visit in appointment notes  3. Confirm consent - "In the setting of the current Covid19 crisis, you are scheduled for a (phone or video) visit with your provider on (date) at (time).  Just as we do with many in-office visits, in order for you to participate in this visit, we must obtain consent.  If you'd like, I can send this to your mychart (if signed up) or email for you to review.  Otherwise, I can obtain your verbal consent now.  All virtual visits are billed to your insurance company just like a normal visit would be.  By agreeing to a virtual visit, we'd like you to understand that the technology does not allow for your provider to perform an examination, and thus may limit your provider's ability to fully assess your condition. If your provider identifies any concerns that need to be evaluated in person, we will make arrangements to do so.  Finally, though the technology is pretty good, we cannot assure that it will always work on either your or our end, and in the setting of a video visit, we may have to convert it to a phone-only visit.  In either situation, we cannot ensure that we have a secure connection.  Are you willing to proceed?" Pt provided verbal consent. 4. Advise patient to be prepared - "Two hours prior  to your appointment, go ahead and check your blood pressure, pulse, oxygen saturation, and your weight (if you have the equipment to check those) and write them all down. When your visit starts, your provider will ask you for this information. If you have an Apple Watch or Kardia device, please plan to have heart rate information ready on the day of your appointment. Please have a pen and paper handy nearby the day of the visit as well."  5. Give patient instructions for MyChart download to smartphone OR Doximity/Doxy.me as below if video visit (depending on what platform provider is using)  6. Inform patient they will receive a phone call 15 minutes prior to their appointment time (may be from unknown caller ID) so they should be prepared to answer    TELEPHONE CALL NOTE  Ethan Brown has been deemed a candidate for a follow-up tele-health visit to limit community exposure during the Covid-19 pandemic. I spoke with the patient via phone to ensure availability of phone/video source, confirm preferred email & phone number, and discuss instructions and expectations.  I reminded Ethan Brown to be prepared with any vital sign and/or heart rhythm information that could potentially be obtained via home monitoring, at the time of his visit. I reminded Ethan Brown to expect a phone call prior to his visit.  Mitzie Na, CMA 02/14/2019 1:42 PM  INSTRUCTIONS FOR DOWNLOADING THE MYCHART APP TO SMARTPHONE  - The patient must first make sure to have activated MyChart and know their login information - If Apple, go to [employer] and type in MyChart in the search bar and download the app. If Android, ask patient to go to [employer] and type in [place] in the search bar and download the app. The app is free but as with any other app downloads, their phone may require them to verify saved payment information or Apple/Android password.  - The patient will need to then log  into the app with their MyChart username and password, and select [facility] as their healthcare provider to link the account. When it is time for your visit, go to the MyChart app, find appointments, and click Begin Video Visit. Be sure to Select Allow for your device to access the Microphone and Camera for your visit. You will then be connected, and your provider will be with you shortly.  **If they have any issues connecting, or need assistance please contact MyChart service desk [phone])**  **If using a computer, in order to ensure the best quality for their visit they will need to use either of the following Internet Browsers: [employer], or Google Chrome**  IF USING DOXIMITY or DOXY.ME - The patient will receive a link just prior to their visit by text.     FULL LENGTH CONSENT FOR TELE-HEALTH VISIT   I hereby voluntarily request, consent and authorize [facility] and its employed or contracted physicians, physician assistants, nurse practitioners or other licensed health care professionals (the Practitioner), to provide me with telemedicine health care services (the "Services") as deemed necessary by the treating Practitioner. I acknowledge and consent to receive the Services by the Practitioner via telemedicine. I understand that the telemedicine visit will involve communicating with the Practitioner through live audiovisual communication technology and the disclosure of certain medical information by electronic transmission. I acknowledge that I have been given the opportunity to request an in-person assessment or other available alternative prior to the telemedicine visit and am voluntarily participating in the telemedicine visit.  I understand that I have the right to withhold or withdraw my consent to the use of telemedicine in the course of my care at any time, without affecting my right to future care or treatment, and that the Practitioner or I may terminate  the telemedicine visit at any time. I understand that I have the right to inspect all information obtained and/or recorded in the course of the telemedicine visit and may receive copies of available information for a reasonable fee.  I understand that some of the potential risks of receiving the Services via telemedicine include:  Marland Kitchen Delay or interruption in medical evaluation due to technological equipment failure or disruption; . Information transmitted may not be sufficient (e.g. poor resolution of images) to allow for appropriate medical decision making by the Practitioner; and/or  . In rare instances, security protocols could fail, causing a breach of personal health information.  Furthermore, I acknowledge that it is my responsibility to provide information about my medical history, conditions and care that is complete and accurate to the best of my ability. I acknowledge that Practitioner's advice, recommendations, and/or decision may be based on factors not within their control, such as incomplete or inaccurate data provided by me or distortions of diagnostic images or specimens that may result from electronic transmissions. I understand that the practice of medicine is not an exact science  and that Practitioner makes no warranties or guarantees regarding treatment outcomes. I acknowledge that I will receive a copy of this consent concurrently upon execution via email to the email address I last provided but may also request a printed copy by calling the office of [facility].    I understand that my insurance will be billed for this visit.   I have read or had this consent read to me. . I understand the contents of this consent, which adequately explains the benefits and risks of the Services being provided via telemedicine.  . I have been provided ample opportunity to ask questions regarding this consent and the Services and have had my questions answered to my satisfaction. . I give my  informed consent for the services to be provided through the use of telemedicine in my medical care  By participating in this telemedicine visit I agree to the above.

## 2019-02-15 NOTE — Telephone Encounter (Signed)
Called Pt to change appointment into a office visit. Communicated with the Pt that face covering is required before entering lobby, also communicated with pt that no one is allowed in the lobby area or exam room besides the patient being seen that day.

## 2019-02-16 ENCOUNTER — Ambulatory Visit (INDEPENDENT_AMBULATORY_CARE_PROVIDER_SITE_OTHER): Payer: Medicare Other | Admitting: Emergency Medicine

## 2019-02-16 ENCOUNTER — Other Ambulatory Visit: Payer: Self-pay | Admitting: Emergency Medicine

## 2019-02-16 ENCOUNTER — Other Ambulatory Visit: Payer: Self-pay

## 2019-02-16 ENCOUNTER — Encounter: Payer: Self-pay | Admitting: Emergency Medicine

## 2019-02-16 VITALS — BP 158/102 | HR 86 | Ht 71.0 in | Wt 198.0 lb

## 2019-02-16 DIAGNOSIS — R05 Cough: Secondary | ICD-10-CM | POA: Diagnosis not present

## 2019-02-16 DIAGNOSIS — R911 Solitary pulmonary nodule: Secondary | ICD-10-CM | POA: Diagnosis not present

## 2019-02-16 DIAGNOSIS — J90 Pleural effusion, not elsewhere classified: Secondary | ICD-10-CM

## 2019-02-16 DIAGNOSIS — R053 Chronic cough: Secondary | ICD-10-CM

## 2019-02-16 NOTE — Assessment & Plan Note (Signed)
Suspect that pulmonary nodule and evolving pleural effusion are the biggest contributor to his cough.  Based on discussion suspect there may also be a component of chronic rhinitis, allergies.  We will try to treat this to address this as an underlying contributor.

## 2019-02-16 NOTE — Progress Notes (Signed)
Subjective:    Patient ID: Ethan Brown, male    DOB: [birth year], 68 y.o.   MRN: [id number]   HPI 68 year old never smoker with borderline hypertension, hyperlipidemia.  He had been under evaluation for chronic cough.  Chest x-ray done 12/29/2018 revealed a small right effusion and associated volume loss.  This prompted a CT scan of his chest done 02/10/2019 which I reviewed, shows multiple enlarged mediastinal and hilar nodes, a moderate right pleural effusion with associated compressive atelectasis.  An oval mass with irregular margins and some spiculation was noted in the medial aspect of the right middle lobe extending into the upper lobe, 3.4 cm. The cough is non-productive, worsening frequency. He has had nasal drainage and mucous. He was tried empirically on albuterol, unclear whether it helps but may help him clear mucous. Hydrocodone syrup, ? Helps at night. Some evolving exertional SOB. He still exercises. He has some wheeze, chest tightness.   Of note he is scheduled to have a screening prostate biopsy this week for a PSA 4.0.    Review of Systems  Constitutional: Negative for fever and unexpected weight change.  HENT: Negative for congestion, dental problem, ear pain, nosebleeds, postnasal drip, rhinorrhea, sinus pressure, sneezing, sore throat and trouble swallowing.   Eyes: Negative for redness and itching.  Respiratory: Positive for cough. Negative for chest tightness, shortness of breath and wheezing.   Cardiovascular: Negative for palpitations and leg swelling.  Gastrointestinal: Negative for nausea and vomiting.  Genitourinary: Negative for dysuria.  Musculoskeletal: Negative for joint swelling.  Skin: Negative for rash.  Neurological: Negative for headaches.  Hematological: Does not bruise/bleed easily.  Psychiatric/Behavioral: Negative for dysphoric mood. The patient is not nervous/anxious.     Past Medical History:  Diagnosis Date  . Borderline hypertension    . Borderline systolic hypertension   . Detached vitreous humor, left   . ED (erectile dysfunction) of organic origin   . Hyperlipidemia   . Lens subluxation, left   . Migraine   . Migraine headache    takes PRN Imitrex  . Pseudophakia   . TGA (transient global amnesia) 2017  . Transient global amnesia      Family History  Problem Relation Age of Onset  . Alcohol abuse Mother   . Diabetes Father   . Hypertension Father   . Prostate cancer Father   . Cancer Father   . Heart attack Maternal Grandfather 78     Social History   Socioeconomic History  . Marital status: Married    Spouse name: [name]  . Number of children: 3  . Years of education: Not on file  . Highest education level: Master's degree (e.g., MA, MS, MEng, MEd, MSW, MBA)  Occupational History  . Occupation: retired Mining engineer  . Financial resource strain: Not on file  . Food insecurity    Worry: Not on file    Inability: Not on file  . Transportation needs    Medical: Not on file    Non-medical: Not on file  Tobacco Use  . Smoking status: Never Smoker  . Smokeless tobacco: Never Used  Substance and Sexual Activity  . Alcohol use: Yes    Alcohol/week: 1.0 standard drinks    Types: 1 Glasses of wine per week  . Drug use: No  . Sexual activity: Not on file  Lifestyle  . Physical activity    Days per week: Not on file    Minutes per session: Not on file  .  Stress: Not on file  Relationships  . Social Herbalist on phone: Not on file    Gets together: Not on file    Attends religious service: Not on file    Active member of club or organization: Not on file    Attends meetings of clubs or organizations: Not on file    Relationship status: Not on file  . Intimate partner violence    Fear of current or ex partner: Not on file    Emotionally abused: Not on file    Physically abused: Not on file    Forced sexual activity: Not on file  Other Topics Concern  . Not on file   Social History Narrative   Married father of 17, grandfather 54.  Lives with his wife, [name] they recently moved to [place] after living in [place].      He is a retired Engineer, maintenance (IT)   He just restarted an exercise plan at [employer] 3 days a week for 90 minutes at a time.  has worked as a Engineer, maintenance (IT), retired.   No Known Allergies   Outpatient Medications Prior to Visit  Medication Sig Dispense Refill  . albuterol (VENTOLIN HFA) 108 (90 Base) MCG/ACT inhaler Inhale 2 puffs into the lungs every 4 (four) hours as needed for wheezing or shortness of breath. 1 Inhaler 2  . Ascorbic Acid (VITAMIN C) 100 MG tablet Take by mouth.    . B Complex Vitamins (B-COMPLEX/B-12) LIQD Place under the tongue.    Marland Kitchen HYDROcodone-homatropine (HYDROMET) 5-1.5 MG/5ML syrup Take 5 mLs by mouth every 4 (four) hours as needed. 240 mL 0  . Multiple Vitamin (MULTIVITAMIN) capsule Take by mouth.    . sildenafil (REVATIO) 20 MG tablet Take as directed 10 tablet 11  . SUMAtriptan (IMITREX) 50 MG tablet Take 1 tablet (50 mg total) by mouth once for 1 dose. 9 tablet 11   No facility-administered medications prior to visit.         Objective:   Physical Exam  Vitals:   02/16/19 0945  BP: (!) 158/102  Pulse: 86  SpO2: 97%  Weight: 198 lb (89.8 kg)  Height: 5\' 11"  (1.803 m)   Gen: Pleasant, well-nourished, in no distress,  normal affect  ENT: No lesions,  mouth clear,  oropharynx clear, no postnasal drip  Neck: No JVD, no stridor  Lungs: No use of accessory muscles, no crackles or wheezing on normal respiration, no wheeze on forced expiration  Cardiovascular: RRR, heart sounds normal, no murmur or gallops, no peripheral edema  Musculoskeletal: No deformities, no cyanosis or clubbing  Neuro: alert, awake, non focal  Skin: Warm, no lesions or rash     Assessment & Plan:  Pulmonary nodule, right Right upper lobe nodule with associated mediastinal lymphadenopathy and a pleural effusion and a never  smoker.  Suspect that this is a large component of his cough.  Very concerning for primary lung malignancy, probably adenocarcinoma.  He needs an expedited diagnostic work-up.  First step should be thoracentesis of his right pleural effusion with cytology.  If this is negative then I will arrange for navigational bronchoscopy with endobronchial ultrasound and biopsies.  Note that he has a slightly elevated PSA and this is being worked up as well.  The findings on CT chest are not characteristic of metastatic disease but suppose that this is possible.  Chronic cough Suspect that pulmonary nodule and evolving pleural effusion are the biggest contributor to his cough.  Based  on discussion suspect there may also be a component of chronic rhinitis, allergies.  We will try to treat this to address this as an underlying contributor.  Baltazar Apo, MD, PhD 02/16/2019, 3:44 PM [facility] [phone] or if no answer [phone]

## 2019-02-16 NOTE — Patient Instructions (Addendum)
We will arrange for a right thoracentesis in interventional radiology  Depending on the results of the thoracentesis we may decide to arrange for bronchoscopy under general anesthesia.  Follow with Dr Ellyn Hack as planned.  Start loratadine 10mg  daily until next visit.  If you continue to have nasal and throat congestion on the above, please try starting Flonase 2 sprays each nostril daily  Follow with Dr Lamonte Sakai in 1 month or next available.

## 2019-02-16 NOTE — Assessment & Plan Note (Signed)
Right upper lobe nodule with associated mediastinal lymphadenopathy and a pleural effusion and a never smoker.  Suspect that this is a large component of his cough.  Very concerning for primary lung malignancy, probably adenocarcinoma.  He needs an expedited diagnostic work-up.  First step should be thoracentesis of his right pleural effusion with cytology.  If this is negative then I will arrange for navigational bronchoscopy with endobronchial ultrasound and biopsies.  Note that he has a slightly elevated PSA and this is being worked up as well.  The findings on CT chest are not characteristic of metastatic disease but suppose that this is possible.

## 2019-02-17 ENCOUNTER — Telehealth: Payer: Self-pay | Admitting: Cardiology

## 2019-02-17 DIAGNOSIS — C61 Malignant neoplasm of prostate: Secondary | ICD-10-CM | POA: Diagnosis not present

## 2019-02-17 DIAGNOSIS — R972 Elevated prostate specific antigen [PSA]: Secondary | ICD-10-CM | POA: Diagnosis not present

## 2019-02-17 DIAGNOSIS — D075 Carcinoma in situ of prostate: Secondary | ICD-10-CM | POA: Diagnosis not present

## 2019-02-17 NOTE — Telephone Encounter (Signed)
I called pt to confirm appt with Dr Ellyn Hack on 02-20-19.

## 2019-02-18 ENCOUNTER — Other Ambulatory Visit (HOSPITAL_COMMUNITY)
Admission: RE | Admit: 2019-02-18 | Discharge: 2019-02-18 | Disposition: A | Discharge status: Home / Self Care | Payer: Medicare Other | Source: Ambulatory Visit | Attending: Emergency Medicine | Admitting: Emergency Medicine

## 2019-02-18 DIAGNOSIS — Z1159 Encounter for screening for other viral diseases: Secondary | ICD-10-CM | POA: Diagnosis not present

## 2019-02-19 LAB — SARS CORONAVIRUS 2 (TAT 6-24 HRS): SARS Coronavirus 2: NEGATIVE

## 2019-02-20 ENCOUNTER — Encounter: Payer: Self-pay | Admitting: Cardiology

## 2019-02-20 ENCOUNTER — Other Ambulatory Visit: Payer: Self-pay

## 2019-02-20 ENCOUNTER — Ambulatory Visit (INDEPENDENT_AMBULATORY_CARE_PROVIDER_SITE_OTHER): Payer: Medicare Other | Admitting: Cardiology

## 2019-02-20 VITALS — BP 145/93 | HR 77 | Ht 71.0 in | Wt 198.4 lb

## 2019-02-20 DIAGNOSIS — Z01810 Encounter for preprocedural cardiovascular examination: Secondary | ICD-10-CM

## 2019-02-20 DIAGNOSIS — R030 Elevated blood-pressure reading, without diagnosis of hypertension: Secondary | ICD-10-CM

## 2019-02-20 DIAGNOSIS — I10 Essential (primary) hypertension: Secondary | ICD-10-CM

## 2019-02-20 DIAGNOSIS — J90 Pleural effusion, not elsewhere classified: Secondary | ICD-10-CM | POA: Diagnosis not present

## 2019-02-20 DIAGNOSIS — R9431 Abnormal electrocardiogram [ECG] [EKG]: Secondary | ICD-10-CM | POA: Diagnosis not present

## 2019-02-20 NOTE — Patient Instructions (Addendum)
Medication Instructions:  Not needed  If you need a refill on your cardiac medications before your next appointment, please call your pharmacy.   Lab work: Not needed   Testing/Procedures: Will be schedule at [facility] has requested that you have an echocardiogram. Echocardiography is a painless test that uses sound waves to create images of your heart. It provides your doctor with information about the size and shape of your heart and how well your heart's chambers and valves are working. This procedure takes approximately one hour. There are no restrictions for this procedure.    Follow-Up: At [facility], you and your health needs are our priority.  As part of our continuing mission to provide you with exceptional heart care, we have created designated Provider Care Teams.  These Care Teams include your primary Cardiologist (physician) and Advanced Practice Providers (APPs -  Physician Assistants and Nurse Practitioners) who all work together to provide you with the care you need, when you need it. . You will need a follow up appointment in  12 months June 2021- ( unless echo is abnormal) .  Please call our office 2 months in advance to schedule this appointment.  You may see Glenetta Hew, MD or one of the following Advanced Practice Providers on your designated Care Team:   . Rosaria Ferries, PA-C . Jory Sims, DNP, ANP  Any Other Special Instructions Will Be Listed Below (If Applicable).

## 2019-02-20 NOTE — Progress Notes (Signed)
PCP: Laurey Morale, MD  Clinic Note: Chief Complaint  Patient presents with  . Follow-up  . Pre-op Exam    HPI: Ethan Brown is a 68 y.o. male with a PMH (notable for Transient Global Amnesia) & new Dx of Lung Ca ho presents today for delayed 1 yr f/u.  Ethan Brown was last seen in March 2019 - doing well , no C/O  Recent Hospitalizations: none  Studies Personally Reviewed - (if available, images/films reviewed: From Epic Chart or Care Everywhere)  none  Interval History: Ethan Brown is presenting here today really for preoperative for likely bronchoscopy millimeters further potential surgical procedures for his most likely diagnosis of lung cancer/adenocarcinoma.  He had a recent chest x-ray evaluation for cough that then led to a CT scan showing pleural effusion and a mass.  Plan for now is a thoracentesis this week on Wednesday 7/1.  Depending on the results of that, he may very well undergo bronchoscopy. Besides the cough he really is not noticing shortness of breath.  He still is able to exercise 30 to 40 minutes at a time riding his bike relatively vigorously.  He then also is able to walk a couple miles at a good pace without any issues.  Usually 6 days a week he walks.   He says occasionally he may feel some palpitations, but nothing prolonged.  There was reported history of mitral prolapse and unfortunate I do not have the previous echo.  He denies any PND orthopnea or edema.  No chest pain or pressure with rest or exertion.  No further transient amnesia, TIA or emesis fugax symptoms.  No syncope or near syncope.  No claudication.  He has also had a recent screening biopsy of his prostate for elevated PSA.  ROS: A comprehensive was performed. Review of Systems  Constitutional: Negative for chills, fever, malaise/fatigue and weight loss.  HENT: Negative for nosebleeds.   Respiratory: Positive for cough. Negative for hemoptysis and shortness of breath.      Not real orthopnea, but he does have dyspnea if he lays on his side.  Cardiovascular: Negative.   Gastrointestinal: Negative for blood in stool, heartburn, melena, nausea and vomiting.  Genitourinary: Negative for hematuria.  Musculoskeletal: Negative for back pain and joint pain.  Neurological: Negative for dizziness and headaches.  Psychiatric/Behavioral: Negative.   All other systems reviewed and are negative.  I have reviewed and (if needed) personally updated the patient's problem list, medications, allergies, past medical and surgical history, social and family history.   Past Medical History:  Diagnosis Date  . Borderline hypertension   . Borderline systolic hypertension   . Detached vitreous humor, left   . ED (erectile dysfunction) of organic origin   . Hyperlipidemia   . Lens subluxation, left   . Migraine   . Migraine headache    takes PRN Imitrex  . Pseudophakia   . TGA (transient global amnesia) 2017  . Transient global amnesia     Past Surgical History:  Procedure Laterality Date  . CAROTID DOPPLERS  09/2017   Mild non-obstructive disease  . CATARACT EXTRACTION, BILATERAL    . cataract, left  2018  . COLONOSCOPY  2016   clear, repeat in 10 yrs   . Eye surgeries     , Lens attachment.  Vitrectomy  . FEMORAL HERNIA REPAIR    . HERNIA REPAIR    . LUMBAR LAMINECTOMY    . retinal attachment, right    . sclearl  buckle, right    . victrectomy,right      Current Meds  Medication Sig  . albuterol (VENTOLIN HFA) 108 (90 Base) MCG/ACT inhaler Inhale 2 puffs into the lungs every 4 (four) hours as needed for wheezing or shortness of breath.  . Ascorbic Acid (VITAMIN C) 100 MG tablet Take by mouth.  . B Complex Vitamins (B-COMPLEX/B-12) LIQD Place under the tongue.  Marland Kitchen HYDROcodone-homatropine (HYDROMET) 5-1.5 MG/5ML syrup Take 5 mLs by mouth every 4 (four) hours as needed.  . Multiple Vitamin (MULTIVITAMIN) capsule Take by mouth.  . sildenafil (REVATIO) 20 MG  tablet Take as directed    No Known Allergies  Social History   Tobacco Use  . Smoking status: Never Smoker  . Smokeless tobacco: Never Used  Substance Use Topics  . Alcohol use: Yes    Alcohol/week: 1.0 standard drinks    Types: 1 Glasses of wine per week  . Drug use: No   Social History   Social History Narrative   Married father of 3, grandfather 3.  Lives with his wife, Ethan Brown they recently moved to [place] after living in [place].      He is a retired Engineer, maintenance (IT)   Was previously on an exercise regimen at [employer] 3 days a week for 90 minutes at a time --> he is now hoping to get back into that plan, but is currently now riding his bike at least 30 to 40 minutes a day for 5 days a week and then doing longer walks about 6 days a week.    family history includes Alcohol abuse in his mother; Cancer in his father; Diabetes in his father; Heart attack (age of onset: 31) in his maternal grandfather; Hypertension in his father; Prostate cancer in his father.  Wt Readings from Last 3 Encounters:  02/20/19 198 lb 6.4 oz (90 kg)  02/16/19 198 lb (89.8 kg)  01/23/19 199 lb 4 oz (90.4 kg)    PHYSICAL EXAM BP (!) 145/93   Pulse 77   Ht 5\' 11"  (1.803 m)   Wt 198 lb 6.4 oz (90 kg)   BMI 27.67 kg/m  Physical Exam  Constitutional: He is oriented to person, place, and time. He appears well-developed and well-nourished. No distress.  Healthy-appearing.  Well-groomed.  HENT:  Head: Normocephalic and atraumatic.  Eyes: EOM are normal.  Neck: Normal range of motion. Neck supple. No hepatojugular reflux and no JVD present. Carotid bruit is not present.  Cardiovascular: Normal rate, regular rhythm, S1 normal, S2 normal and intact distal pulses.  No extrasystoles are present. PMI is not displaced. Exam reveals distant heart sounds. Exam reveals no gallop and no friction rub.  No murmur heard. Pulmonary/Chest: Effort normal. No respiratory distress. He has no wheezes. He has  no rales.  Diminished breath sounds in the lower one third of the right base with dullness to percussion.  Abdominal: Soft. Bowel sounds are normal. He exhibits no distension. There is no abdominal tenderness. There is no rebound.  Musculoskeletal: Normal range of motion.        General: No edema (Trivial).  Neurological: He is alert and oriented to person, place, and time.  Skin: Skin is warm and dry.  Psychiatric: He has a normal mood and affect. His behavior is normal. Judgment and thought content normal.     Adult ECG Report  Rate: 77 ;  Rhythm: normal sinus rhythm and LAFB (axis -61);, IVCD.;   Narrative Interpretation: Stable EKG   Other studies  Reviewed: Additional studies/ records that were reviewed today include:  Recent Labs:   Lab Results  Component Value Date   CREATININE 0.93 01/23/2019   BUN 17 01/23/2019   NA 139 01/23/2019   K 4.8 01/23/2019   CL 104 01/23/2019   CO2 26 01/23/2019   Lab Results  Component Value Date   CHOL 169 08/30/2018   HDL 41.40 08/30/2018   LDLCALC 104 (H) 08/30/2018   TRIG 115.0 08/30/2018   CHOLHDL 4 08/30/2018    ASSESSMENT / PLAN: Problem List Items Addressed This Visit    Preop cardiovascular exam    Mr. Usman is able to vigorously exercise both on the bike and with walking with no active heart failure or angina type symptoms.  He has normal renal function,, and is nondiabetic.  There is no history of a stroke.  Based on the [place], HE WILL BE CONSIDERED TO BE A LOW RISK PATIENT FOR LOW RISK PROCEDURE.  I am checking a 2D echocardiogram simply because of concern for possible occult pericardial effusion and the borderline EKG.  However I would not recommend any further cardiac evaluation unless the echocardiogram is grossly abnormal.       Relevant Orders   ECHOCARDIOGRAM COMPLETE   Pleural effusion    With concern for this being a malignant effusion, need to exclude the possibility of pericardial  effusion. Plan: Check 2D echocardiogram.      Relevant Orders   ECHOCARDIOGRAM COMPLETE   Nonspecific abnormal electrocardiogram (ECG) (EKG)    He clearly is not having any cardiac symptoms, but does have interventricular conduction delay and other minor abnormalities in his EKG perfect extubation.  I would like to exclude any significant structural or wall motion abnormality. Plan: Check 2D echo.      Relevant Orders   ECHOCARDIOGRAM COMPLETE   Borderline hypertension (Chronic)    His blood pressures been a little bit higher of late, partly because of the stress from his new diagnosis.  He is not on a blood pressure medication and with relatively stable blood pressures, I would not treat based on this currently today.  Monitor.      Relevant Orders   ECHOCARDIOGRAM COMPLETE    Other Visit Diagnoses    Essential hypertension    -  Primary   Relevant Orders   EKG 12-Lead (Completed)       I spent a total of 68minutes with the patient and chart review. >  50% of the time was spent in direct patient consultation.   Current medicines are reviewed at length with the patient today.  (+/- concerns) none The following changes have been made:  None  Patient Instructions  Medication Instructions:  Not needed  If you need a refill on your cardiac medications before your next appointment, please call your pharmacy.   Lab work: Not needed   Testing/Procedures: Will be schedule at [place] has requested that you have an echocardiogram. Echocardiography is a painless test that uses sound waves to create images of your heart. It provides your doctor with information about the size and shape of your heart and how well your heart's chambers and valves are working. This procedure takes approximately one hour. There are no restrictions for this procedure.    Follow-Up: At [facility], you and your health needs are our priority.  As part of our  continuing mission to provide you with exceptional heart care, we have created designated  Provider Care Teams.  These Care Teams include your primary Cardiologist (physician) and Advanced Practice Providers (APPs -  Physician Assistants and Nurse Practitioners) who all work together to provide you with the care you need, when you need it. . You will need a follow up appointment in  12 months June 2021- ( unless echo is abnormal) .  Please call our office 2 months in advance to schedule this appointment.  You may see Glenetta Hew, MD or one of the following Advanced Practice Providers on your designated Care Team:   . Rosaria Ferries, PA-C . Jory Sims, DNP, ANP  Any Other Special Instructions Will Be Listed Below (If Applicable).    Studies Ordered:   Orders Placed This Encounter  Procedures  . EKG 12-Lead  . ECHOCARDIOGRAM COMPLETE      Glenetta Hew, M.D., M.S. Interventional Cardiologist   Pager # [phone] Phone # [phone] [address] [place], [place] [address]   Thank you for choosing Heart[facility] at [facility]!!

## 2019-02-21 ENCOUNTER — Encounter: Payer: Self-pay | Admitting: Cardiology

## 2019-02-21 NOTE — Assessment & Plan Note (Signed)
He clearly is not having any cardiac symptoms, but does have interventricular conduction delay and other minor abnormalities in his EKG perfect extubation.  I would like to exclude any significant structural or wall motion abnormality. Plan: Check 2D echo.

## 2019-02-21 NOTE — Assessment & Plan Note (Signed)
With concern for this being a malignant effusion, need to exclude the possibility of pericardial effusion. Plan: Check 2D echocardiogram.

## 2019-02-21 NOTE — Assessment & Plan Note (Signed)
Mr. Perine is able to vigorously exercise both on the bike and with walking with no active heart failure or angina type symptoms.  He has normal renal function,, and is nondiabetic.  There is no history of a stroke.  Based on the [place], HE WILL BE CONSIDERED TO BE A LOW RISK PATIENT FOR LOW RISK PROCEDURE.  I am checking a 2D echocardiogram simply because of concern for possible occult pericardial effusion and the borderline EKG.  However I would not recommend any further cardiac evaluation unless the echocardiogram is grossly abnormal.

## 2019-02-21 NOTE — Assessment & Plan Note (Signed)
His blood pressures been a little bit higher of late, partly because of the stress from his new diagnosis.  He is not on a blood pressure medication and with relatively stable blood pressures, I would not treat based on this currently today.  Monitor.

## 2019-02-22 ENCOUNTER — Other Ambulatory Visit (HOSPITAL_COMMUNITY): Payer: Self-pay | Admitting: Student

## 2019-02-22 ENCOUNTER — Ambulatory Visit (HOSPITAL_COMMUNITY)
Admission: RE | Admit: 2019-02-22 | Discharge: 2019-02-22 | Disposition: A | Discharge status: Home / Self Care | Payer: Medicare Other | Source: Ambulatory Visit | Attending: Emergency Medicine | Admitting: Emergency Medicine

## 2019-02-22 ENCOUNTER — Other Ambulatory Visit: Payer: Self-pay

## 2019-02-22 ENCOUNTER — Ambulatory Visit (HOSPITAL_COMMUNITY)
Admission: RE | Admit: 2019-02-22 | Discharge: 2019-02-22 | Disposition: A | Discharge status: Home / Self Care | Payer: Medicare Other | Source: Ambulatory Visit | Attending: Student | Admitting: Student

## 2019-02-22 ENCOUNTER — Encounter (HOSPITAL_COMMUNITY): Payer: Self-pay | Admitting: Student

## 2019-02-22 DIAGNOSIS — J90 Pleural effusion, not elsewhere classified: Secondary | ICD-10-CM

## 2019-02-22 DIAGNOSIS — J910 Malignant pleural effusion: Secondary | ICD-10-CM | POA: Insufficient documentation

## 2019-02-22 DIAGNOSIS — C384 Malignant neoplasm of pleura: Secondary | ICD-10-CM | POA: Insufficient documentation

## 2019-02-22 HISTORY — PX: IR THORACENTESIS ASP PLEURAL SPACE W/IMG GUIDE: IMG5380

## 2019-02-22 LAB — PROTEIN, PLEURAL OR PERITONEAL FLUID: Total protein, fluid: 4.8 g/dL

## 2019-02-22 LAB — LACTATE DEHYDROGENASE, PLEURAL OR PERITONEAL FLUID: LD, Fluid: 360 U/L — ABNORMAL HIGH (ref 3–23)

## 2019-02-22 MED ORDER — LIDOCAINE HCL 1 % IJ SOLN
INTRAMUSCULAR | Status: AC
  Filled 2019-02-22: qty 20

## 2019-02-22 NOTE — Procedures (Signed)
PROCEDURE SUMMARY:  Patient presented for thoracentesis today.  He underwent pre-procedure testing and was Covid negative on 6/27.  Remains asymptomatic and and afebrile today.  Mild cough due to right pleural effusion.  Successful US guided diagnostic and therapeutic right thoracentesis. Yielded 65 mL of yellow fluid. Pt became lightheaded and stated 'I'm going to pass out' after withdrawal of 1 syringe of fluid.  He was unable to proceed with further removal of fluid.  Procedure stopped.  BP remained appropriate SBP ranged from 111-145.  No immediate complications.  Specimen was sent for labs. CXR ordered.  EBL < 5 mL  Docia Barrier PA-C 02/22/2019 10:47 AM

## 2019-03-01 ENCOUNTER — Other Ambulatory Visit: Payer: Self-pay

## 2019-03-01 ENCOUNTER — Ambulatory Visit (HOSPITAL_COMMUNITY): Payer: Medicare Other | Attending: Cardiovascular Disease

## 2019-03-01 DIAGNOSIS — R030 Elevated blood-pressure reading, without diagnosis of hypertension: Secondary | ICD-10-CM

## 2019-03-01 DIAGNOSIS — Z01810 Encounter for preprocedural cardiovascular examination: Secondary | ICD-10-CM | POA: Diagnosis not present

## 2019-03-01 DIAGNOSIS — J90 Pleural effusion, not elsewhere classified: Secondary | ICD-10-CM

## 2019-03-01 DIAGNOSIS — R9431 Abnormal electrocardiogram [ECG] [EKG]: Secondary | ICD-10-CM

## 2019-03-02 ENCOUNTER — Other Ambulatory Visit: Payer: Self-pay | Admitting: *Deleted

## 2019-03-02 NOTE — Progress Notes (Signed)
The proposed treatment discussed at cancer conference on 03/02/2019 is for discussion purpose only and is not a binding recommendation.  Patient was not physically examined nor present for their treatment options.  Therefore, final treatment plans cannot be decided.   Dr. Lamonte Sakai updated on cancer conference discussion.

## 2019-03-06 DIAGNOSIS — C3491 Malignant neoplasm of unspecified part of right bronchus or lung: Secondary | ICD-10-CM

## 2019-03-06 DIAGNOSIS — J90 Pleural effusion, not elsewhere classified: Secondary | ICD-10-CM

## 2019-03-06 NOTE — Telephone Encounter (Signed)
Patient sent 3 consecutive emails today:  On July 1 I underwent a thoracentesis to remove fluid buildup from a pleural effusion. About 60 ml was extracted, adequate for chemical and pathological testing, I was told.    Will I see the pathology results on MyChart, and if so when can I expect to see it?     Or this information to be held until my Aug 3 scheduled appointment with Dr Lamonte Sakai for discussion at that point?    Please advise.    Ethan Brown ---------------------------------------- Thank you for the pathology report.     Is there yet any indication as to the stage of this cancer?   Is it known if the cancer has spread?   Is there yet a determination of how the cancer should be treated?     Thanks   Ethan Brown    E-mail routed to Dr Lamonte Sakai with patient pathology/staging questions

## 2019-03-06 NOTE — Telephone Encounter (Signed)
See other today 03/06/19 e-mail encounter (patient sent 3)

## 2019-03-07 NOTE — Telephone Encounter (Signed)
Cytology results are now available, I will call the patient

## 2019-03-07 NOTE — Telephone Encounter (Signed)
Pathology available - I will call the patient

## 2019-03-07 NOTE — Telephone Encounter (Signed)
I discussed the pathology results with the patient.  Discussed the staging, stage IV adenocarcinoma based on positive pleural fluid cytology.  He would like to be referred to the [facility] at [facility] and I will arrange.  He also is interested in a repeat therapeutic thoracentesis.  He had a syncopal episode at his first procedure and they only were able to draw out 60 cc.  I will order this as well.

## 2019-03-08 ENCOUNTER — Other Ambulatory Visit: Payer: Self-pay | Admitting: Emergency Medicine

## 2019-03-08 ENCOUNTER — Encounter: Payer: Self-pay | Admitting: *Deleted

## 2019-03-08 ENCOUNTER — Telehealth: Payer: Self-pay | Admitting: *Deleted

## 2019-03-08 DIAGNOSIS — R3912 Poor urinary stream: Secondary | ICD-10-CM | POA: Diagnosis not present

## 2019-03-08 DIAGNOSIS — N401 Enlarged prostate with lower urinary tract symptoms: Secondary | ICD-10-CM | POA: Diagnosis not present

## 2019-03-08 DIAGNOSIS — C61 Malignant neoplasm of prostate: Secondary | ICD-10-CM | POA: Diagnosis not present

## 2019-03-08 DIAGNOSIS — J90 Pleural effusion, not elsewhere classified: Secondary | ICD-10-CM

## 2019-03-08 NOTE — Telephone Encounter (Signed)
Oncology Nurse Navigator Documentation  Oncology Nurse Navigator Flowsheets 03/08/2019  Navigator Location [facility]  Referral Date to RadOnc/MedOnc 03/08/2019  Navigator Encounter Type Letter/Fax/Email;Telephone/I received referred on Mr. [place]ig[place] today.  I updated Dr. Julien Nordmann on referral.  I called patient and updated on appt time and place.   Telephone Outgoing Call  Treatment Phase Pre-Tx/Tx Discussion  Barriers/Navigation Needs Coordination of Care;Education  Education Other  Interventions Coordination of Care;Education  Coordination of Care Appts  Education Method Verbal  Acuity Level 3  Time Spent with Patient 30

## 2019-03-09 ENCOUNTER — Other Ambulatory Visit: Payer: Self-pay | Admitting: Family Medicine

## 2019-03-10 ENCOUNTER — Other Ambulatory Visit (HOSPITAL_COMMUNITY)
Admission: RE | Admit: 2019-03-10 | Discharge: 2019-03-10 | Disposition: A | Discharge status: Home / Self Care | Payer: Medicare Other | Source: Ambulatory Visit | Attending: Emergency Medicine | Admitting: Emergency Medicine

## 2019-03-10 DIAGNOSIS — Z1159 Encounter for screening for other viral diseases: Secondary | ICD-10-CM | POA: Insufficient documentation

## 2019-03-10 LAB — SARS CORONAVIRUS 2 (TAT 6-24 HRS): SARS Coronavirus 2: NEGATIVE

## 2019-03-13 ENCOUNTER — Other Ambulatory Visit: Payer: Self-pay

## 2019-03-13 ENCOUNTER — Ambulatory Visit (HOSPITAL_COMMUNITY)
Admission: RE | Admit: 2019-03-13 | Discharge: 2019-03-13 | Disposition: A | Discharge status: Home / Self Care | Payer: Medicare Other | Source: Ambulatory Visit | Attending: Emergency Medicine | Admitting: Emergency Medicine

## 2019-03-13 ENCOUNTER — Other Ambulatory Visit (HOSPITAL_COMMUNITY): Payer: Self-pay | Admitting: Student

## 2019-03-13 ENCOUNTER — Ambulatory Visit (HOSPITAL_COMMUNITY)
Admission: RE | Admit: 2019-03-13 | Discharge: 2019-03-13 | Disposition: A | Discharge status: Home / Self Care | Payer: Medicare Other | Source: Ambulatory Visit | Attending: Student | Admitting: Student

## 2019-03-13 ENCOUNTER — Encounter (HOSPITAL_COMMUNITY): Payer: Self-pay | Admitting: Student

## 2019-03-13 DIAGNOSIS — C3491 Malignant neoplasm of unspecified part of right bronchus or lung: Secondary | ICD-10-CM | POA: Diagnosis not present

## 2019-03-13 DIAGNOSIS — J90 Pleural effusion, not elsewhere classified: Secondary | ICD-10-CM

## 2019-03-13 DIAGNOSIS — J910 Malignant pleural effusion: Secondary | ICD-10-CM | POA: Diagnosis not present

## 2019-03-13 HISTORY — PX: IR THORACENTESIS ASP PLEURAL SPACE W/IMG GUIDE: IMG5380

## 2019-03-13 MED ORDER — LIDOCAINE HCL 1 % IJ SOLN
INTRAMUSCULAR | Status: AC
  Filled 2019-03-13: qty 20

## 2019-03-13 MED ORDER — LIDOCAINE HCL (PF) 1 % IJ SOLN
INTRAMUSCULAR | Status: DC | PRN
  Administered 2019-03-13: 10 mL

## 2019-03-13 NOTE — Procedures (Signed)
PROCEDURE SUMMARY:  Successful US guided therapeutic right thoracentesis. Yielded 1.9 liters of amber fluid. Pt tolerated procedure well. No immediate complications.  Specimen was not sent for labs. CXR ordered.  EBL < 5 mL  Docia Barrier PA-C 03/13/2019 10:53 AM

## 2019-03-15 ENCOUNTER — Encounter: Payer: Self-pay | Admitting: Internal Medicine

## 2019-03-15 ENCOUNTER — Inpatient Hospital Stay (HOSPITAL_BASED_OUTPATIENT_CLINIC_OR_DEPARTMENT_OTHER): Payer: Medicare Other | Admitting: Internal Medicine

## 2019-03-15 ENCOUNTER — Other Ambulatory Visit: Payer: Self-pay | Admitting: Medical Oncology

## 2019-03-15 ENCOUNTER — Inpatient Hospital Stay: Payer: Medicare Other | Attending: Internal Medicine

## 2019-03-15 ENCOUNTER — Other Ambulatory Visit: Payer: Self-pay

## 2019-03-15 VITALS — BP 139/92 | HR 88 | Temp 98.2°F | Resp 18 | Ht 71.0 in | Wt 194.2 lb

## 2019-03-15 DIAGNOSIS — J910 Malignant pleural effusion: Secondary | ICD-10-CM | POA: Diagnosis not present

## 2019-03-15 DIAGNOSIS — C3491 Malignant neoplasm of unspecified part of right bronchus or lung: Secondary | ICD-10-CM | POA: Insufficient documentation

## 2019-03-15 DIAGNOSIS — Z5111 Encounter for antineoplastic chemotherapy: Secondary | ICD-10-CM | POA: Insufficient documentation

## 2019-03-15 DIAGNOSIS — K769 Liver disease, unspecified: Secondary | ICD-10-CM

## 2019-03-15 DIAGNOSIS — C342 Malignant neoplasm of middle lobe, bronchus or lung: Secondary | ICD-10-CM | POA: Insufficient documentation

## 2019-03-15 DIAGNOSIS — J90 Pleural effusion, not elsewhere classified: Secondary | ICD-10-CM

## 2019-03-15 DIAGNOSIS — R590 Localized enlarged lymph nodes: Secondary | ICD-10-CM

## 2019-03-15 DIAGNOSIS — C3490 Malignant neoplasm of unspecified part of unspecified bronchus or lung: Secondary | ICD-10-CM

## 2019-03-15 DIAGNOSIS — Z5112 Encounter for antineoplastic immunotherapy: Secondary | ICD-10-CM | POA: Insufficient documentation

## 2019-03-15 DIAGNOSIS — Z7189 Other specified counseling: Secondary | ICD-10-CM | POA: Insufficient documentation

## 2019-03-15 LAB — CMP (CANCER CENTER ONLY)
ALT: 14 U/L (ref 0–44)
AST: 16 U/L (ref 15–41)
Albumin: 3.2 g/dL — ABNORMAL LOW (ref 3.5–5.0)
Alkaline Phosphatase: 67 U/L (ref 38–126)
Anion gap: 7 (ref 5–15)
BUN: 19 mg/dL (ref 8–23)
CO2: 25 mmol/L (ref 22–32)
Calcium: 8.7 mg/dL — ABNORMAL LOW (ref 8.9–10.3)
Chloride: 109 mmol/L (ref 98–111)
Creatinine: 1.1 mg/dL (ref 0.61–1.24)
GFR, Est AFR Am: >60 mL/min (ref >60)
GFR, Estimated: >60 mL/min (ref >60)
Glucose, Bld: 113 mg/dL — ABNORMAL HIGH (ref 70–99)
Potassium: 4.3 mmol/L (ref 3.5–5.1)
Sodium: 141 mmol/L (ref 135–145)
Total Bilirubin: 0.3 mg/dL (ref 0.3–1.2)
Total Protein: 6 g/dL — ABNORMAL LOW (ref 6.5–8.1)

## 2019-03-15 LAB — CBC WITH DIFFERENTIAL (CANCER CENTER ONLY)
Abs Immature Granulocytes: 0.02 10*3/uL (ref 0.00–0.07)
Basophils Absolute: 0 10*3/uL (ref 0.0–0.1)
Basophils Relative: 1 %
Eosinophils Absolute: 0.1 10*3/uL (ref 0.0–0.5)
Eosinophils Relative: 2 %
HCT: 41.9 % (ref 39.0–52.0)
Hemoglobin: 13.9 g/dL (ref 13.0–17.0)
Immature Granulocytes: 0 %
Lymphocytes Relative: 21 %
Lymphs Abs: 1.1 10*3/uL (ref 0.7–4.0)
MCH: 31.4 pg (ref 26.0–34.0)
MCHC: 33.2 g/dL (ref 30.0–36.0)
MCV: 94.8 fL (ref 80.0–100.0)
Monocytes Absolute: 0.4 10*3/uL (ref 0.1–1.0)
Monocytes Relative: 8 %
Neutro Abs: 3.6 10*3/uL (ref 1.7–7.7)
Neutrophils Relative %: 68 %
Platelet Count: 201 10*3/uL (ref 150–400)
RBC: 4.42 MIL/uL (ref 4.22–5.81)
RDW: 12.2 % (ref 11.5–15.5)
WBC Count: 5.3 10*3/uL (ref 4.0–10.5)
nRBC: 0 % (ref 0.0–0.2)

## 2019-03-15 NOTE — Progress Notes (Signed)
[place] [phone]   [phone]) [phone]  CONSULT NOTE  REFERRING PHYSICIAN: Dr. Alysia Brown  REASON FOR CONSULTATION:  68 years old white male recently diagnosed with lung cancer.  HPI Ethan Brown is a 67 y.o. male a never smoker with past medical history significant for hypertension, migraine, hernia repair as well as lumbar laminectomy and right retinal detachment.  The patient mentions that he has been complaining of cough since January 2020.  He has similar episodes every 2 years since his childhood.  He was seen for routine annual examination in January 2020 and was complaining of the cough episodes.  His cough episode was getting worse and was not resolved as before.  He was seen again by his primary care physician Dr. Sarajane Brown and chest x-ray on 01/18/2019 showed small right pleural effusion.  This was followed by CT scan of the chest on 02/09/2019 and that showed 3.4 x 2.4 cm mass in the medial aspect of the right middle lobe and extending into the right upper lobe compatible with a primary lung carcinoma.  There was also multiple pleural-based nodules in the right lung compatible with pleural metastatic disease.  The scan also showed multiple enlarged mediastinal and right hilar lymph nodes including a precarinal lymph node with short axis of 1.9 cm and a right hilar lymph node with short axis of 1.6 cm.  There was confluent soft tissue thickening in the medial aspect of the right lower lobe abutting the pericardium and in the adjacent right lower lobe parenchyma suspicious for metastatic disease.  The scan also showed moderate size right pleural effusion that increased in size in addition to right lower lobe compressive atelectasis and a small number of tiny left pleural-based nodules.  There was also a 1.2 cm right lobe liver mass suspicious for metastasis.  He underwent ultrasound-guided right thoracentesis on 02/22/2019.  The final pathology ([id number])  showed malignant cells with features favoring adenocarcinoma. Immunohistochemistry on the cell block shows positivity with cytokeratin 7, MOC-31, Napsin A, and thyroid transcription factor-1. The tumor cells are negative with CDX2, cytokeratin 20, cytokeratin 5/6, prostein, prostate specific antigen, PAX-8, S100 and WT-1. The immunophenotype is consistent with primary lung adenocarcinoma.  He was getting more short of breath and the patient had another ultrasound-guided thoracentesis on 03/13/2019 with drainage of 1.9 L of pleural fluid. The patient was referred to me today for evaluation and recommendation regarding treatment of his condition. When seen today he is feeling fine except for dry cough and shortness of breath with exertion but this improved significantly after the second thoracentesis.  He denied having any chest pain or hemoptysis.  He denied having any nausea, vomiting, diarrhea or constipation.  He denied having any headache or visual changes.  He lost around 7 pounds since January 2020. Family history significant for mother died from alcoholic complications.  Father had prostate cancer, high blood pressure and diabetes mellitus.  No family history of other malignancy. The patient is married and has 3 children.  Is currently retired and used to work as a Engineer, maintenance (IT).  He was accompanied by his wife Ethan Brown on the phone during the visit.  He has no history of smoking and drinks 1 to 2 glasses of wine every week.  He has no history of drug abuse.  HPI  Past Medical History:  Diagnosis Date  . Borderline hypertension   . Borderline systolic hypertension   . Detached vitreous humor, left   . ED (erectile  dysfunction) of organic origin   . Hyperlipidemia   . Lens subluxation, left   . Migraine   . Migraine headache    takes PRN Imitrex  . Pseudophakia   . TGA (transient global amnesia) 2017  . Transient global amnesia     Past Surgical History:  Procedure Laterality Date  . CAROTID  DOPPLERS  09/2017   Mild non-obstructive disease  . CATARACT EXTRACTION, BILATERAL    . cataract, left  2018  . COLONOSCOPY  2016   clear, repeat in 10 yrs   . Eye surgeries     , Lens attachment.  Vitrectomy  . FEMORAL HERNIA REPAIR    . HERNIA REPAIR    . IR THORACENTESIS ASP PLEURAL SPACE W/IMG GUIDE  02/22/2019  . IR THORACENTESIS ASP PLEURAL SPACE W/IMG GUIDE  03/13/2019  . LUMBAR LAMINECTOMY    . retinal attachment, right    . sclearl buckle, right    . victrectomy,right      Family History  Problem Relation Age of Onset  . Alcohol abuse Mother   . Diabetes Father   . Hypertension Father   . Prostate cancer Father   . Cancer Father   . Heart attack Maternal Grandfather 78    Social History Social History   Tobacco Use  . Smoking status: Never Smoker  . Smokeless tobacco: Never Used  Substance Use Topics  . Alcohol use: Yes    Alcohol/week: 1.0 standard drinks    Types: 1 Glasses of wine per week  . Drug use: No    No Known Allergies  Current Outpatient Medications  Medication Sig Dispense Refill  . albuterol (VENTOLIN HFA) 108 (90 Base) MCG/ACT inhaler Inhale 2 puffs into the lungs every 4 (four) hours as needed for wheezing or shortness of breath. 1 Inhaler 2  . Ascorbic Acid (VITAMIN C) 100 MG tablet Take by mouth.    . B Complex Vitamins (B-COMPLEX/B-12) LIQD Place under the tongue.    Marland Kitchen HYDROcodone-homatropine (HYDROMET) 5-1.5 MG/5ML syrup Take 5 mLs by mouth every 4 (four) hours as needed. 240 mL 0  . Multiple Vitamin (MULTIVITAMIN) capsule Take by mouth.    . sildenafil (REVATIO) 20 MG tablet Take as directed 10 tablet 11  . SUMAtriptan (IMITREX) 50 MG tablet Take 1 tablet by mouth once.     No current facility-administered medications for this visit.     Review of Systems  Constitutional: negative Eyes: negative Ears, nose, mouth, throat, and face: negative Respiratory: positive for cough and dyspnea on exertion Cardiovascular:  negative Gastrointestinal: negative Genitourinary:negative Integument/breast: negative Hematologic/lymphatic: negative Musculoskeletal:negative Neurological: negative Behavioral/Psych: negative Endocrine: negative Allergic/Immunologic: negative  Physical Exam  UEA:VWUJW, healthy, no distress, well nourished and well developed SKIN: skin color, texture, turgor are normal, no rashes or significant lesions HEAD: Normocephalic, No masses, lesions, tenderness or abnormalities EYES: normal, PERRLA, Conjunctiva are pink and non-injected EARS: External ears normal, Canals clear OROPHARYNX:no exudate, no erythema and lips, buccal mucosa, and tongue normal  NECK: supple, no adenopathy, no JVD LYMPH:  no palpable lymphadenopathy, no hepatosplenomegaly LUNGS: decreased breath sounds HEART: regular rate & rhythm, no murmurs and no gallops ABDOMEN:abdomen soft, non-tender, normal bowel sounds and no masses or organomegaly BACK: No CVA tenderness, Range of motion is normal EXTREMITIES:no joint deformities, effusion, or inflammation, no edema  NEURO: alert & oriented x 3 with fluent speech, no focal motor/sensory deficits  PERFORMANCE STATUS: ECOG 1  LABORATORY DATA: Lab Results  Component Value Date   WBC 5.3  03/15/2019   HGB 13.9 03/15/2019   HCT 41.9 03/15/2019   MCV 94.8 03/15/2019   PLT 201 03/15/2019      Chemistry      Component Value Date/Time   NA 139 01/23/2019 1030   K 4.8 01/23/2019 1030   CL 104 01/23/2019 1030   CO2 26 01/23/2019 1030   BUN 17 01/23/2019 1030   CREATININE 0.93 01/23/2019 1030      Component Value Date/Time   CALCIUM 8.8 01/23/2019 1030   ALKPHOS 50 08/30/2018 1049   AST 16 08/30/2018 1049   ALT 20 08/30/2018 1049   BILITOT 0.5 08/30/2018 1049       RADIOGRAPHIC STUDIES: Dg Chest 1 View  Result Date: 03/13/2019 CLINICAL DATA:  Post right thoracentesis EXAM: CHEST  1 VIEW COMPARISON:  02/22/2019 FINDINGS: Decreasing right effusion and right  base atelectasis. No pneumothorax following thoracentesis. Left lung clear. Heart is normal size. No acute bony abnormality. IMPRESSION: Small right effusion and right base atelectasis, both improving since prior study. No pneumothorax following thoracentesis. Electronically Signed   By: Rolm Baptise M.D.   On: 03/13/2019 11:06   Dg Chest 1 View  Result Date: 02/22/2019 CLINICAL DATA:  Status post right-sided thoracentesis. EXAM: CHEST  1 VIEW COMPARISON:  Chest CT 02/09/2019 FINDINGS: Interval decrease in size of the right pleural effusion with moderate overlying right lower lobe atelectasis. No postprocedural pneumothorax is identified. The left lung remains clear. IMPRESSION: Interval decrease in size of the right pleural effusion. Moderate overlying atelectasis. The left lung remains clear. Electronically Signed   By: Marijo Sanes M.D.   On: 02/22/2019 11:05   Ir Thoracentesis Asp Pleural Space W/img Guide  Result Date: 03/13/2019 INDICATION: Patient with history of lung adenocarcinoma, pleural effusion. Request is made for therapeutic right thoracentesis. EXAM: ULTRASOUND GUIDED THERAPEUTIC RIGHT THORACENTESIS MEDICATIONS: 10 mL 1% lidocaine COMPLICATIONS: None immediate. PROCEDURE: An ultrasound guided thoracentesis was thoroughly discussed with the patient and questions answered. The benefits, risks, alternatives and complications were also discussed. The patient understands and wishes to proceed with the procedure. Written consent was obtained. Ultrasound was performed to localize and mark an adequate pocket of fluid in the right chest. The area was then prepped and draped in the normal sterile fashion. 1% Lidocaine was used for local anesthesia. Under ultrasound guidance a 6 Fr Safe-T-Centesis catheter was introduced. Thoracentesis was performed. The catheter was removed and a dressing applied. FINDINGS: A total of approximately 1.9 liters of amber fluid was removed. Samples were sent to the  laboratory as requested by the clinical team. IMPRESSION: Successful ultrasound guided therapeutic right thoracentesis yielding 1.9 L of pleural fluid. Read by: Brynda Greathouse PA-C Electronically Signed   By: Aletta Edouard M.D.   On: 03/13/2019 11:10   Ir Thoracentesis Asp Pleural Space W/img Guide  Result Date: 02/22/2019 INDICATION: Patient with right pleural effusion. Request is made for right diagnostic and therapeutic thoracentesis. EXAM: ULTRASOUND GUIDED DIAGNOSTIC AND THERAPEUTIC RIGHT THORACENTESIS MEDICATIONS: 10 mL 1% lidocaine COMPLICATIONS: None immediate. PROCEDURE: An ultrasound guided thoracentesis was thoroughly discussed with the patient and questions answered. The benefits, risks, alternatives and complications were also discussed. The patient understands and wishes to proceed with the procedure. Written consent was obtained. Ultrasound was performed to localize and mark an adequate pocket of fluid in the right chest. The area was then prepped and draped in the normal sterile fashion. 1% Lidocaine was used for local anesthesia. Under ultrasound guidance a 6 Fr Safe-T-Centesis catheter was introduced. Thoracentesis  was performed. The catheter was removed and a dressing applied. FINDINGS: A total of approximately 60 mL of amber fluid was removed. Samples were sent to the laboratory as requested by the clinical team. IMPRESSION: Successful ultrasound guided diagnostic and therapeutic right thoracentesis yielding 60 mL of pleural fluid. Read by: Brynda Greathouse PA-C No pneumothorax on follow-up radiograph. Electronically Signed   By: Lucrezia Europe M.D.   On: 02/22/2019 11:16    ASSESSMENT: This is a very pleasant non-smoker 68 years old white male recently diagnosed with a stage IV (T2b, N2, M1c) presented with right middle lobe lung mass in addition to mediastinal lymphadenopathy as well as malignant right pleural effusion with pleural-based nodules and suspicious right hepatic lesion diagnosed  in July 2020.   PLAN: I had a lengthy discussion with the patient and his wife today about his current disease stage, prognosis and treatment options. I personally and independently reviewed the scan images and discussed the result and showed the images to the patient and his wife. I recommended for the patient to complete the staging work-up by ordering a PET scan as well as MRI of the brain to rule out any other metastatic disease. I will send the patient to the lab today for Guardant 360 molecular study by blood. I will also refer the patient to Dr. Roxan Hockey for consideration of right Pleurx catheter placement and drainage of pleural fluid and also to have enough tissue for molecular studies if needed by tissue. I discussed with the patient his treatment options and he understand that he has incurable condition and all the treatment will be of palliative nature. I discussed with him and his wife the prognosis with the different options.  He was given the option of palliative care versus palliative systemic chemotherapy with carboplatin, Alimta in addition to immunotherapy with Keytruda every 3 weeks if the molecular studies are negative for molecular aberrations versus treatment with targeted therapy if the patient has a positive molecular study which is likely in this patient with never smoking history but need to be confirmed. I will arrange for the patient to come back for follow-up visit in less than 2 weeks for evaluation and more detailed discussion of his treatment options based on the staging work-up and molecular studies. He was also advised to call if he has any concerning symptoms in the interval. The patient voices understanding of current disease status and treatment options and is in agreement with the current care plan.  All questions were answered. The patient knows to call the clinic with any problems, questions or concerns. We can certainly see the patient much sooner if  necessary.  Thank you so much for allowing me to participate in the care of Ethan Brown. I will continue to follow up the patient with you and assist in his care.  I spent 55 minutes counseling the patient face to face. The total time spent in the appointment was 80 minutes.  Disclaimer: This note was dictated with voice recognition software. Similar sounding words can inadvertently be transcribed and may not be corrected upon review.   Eilleen Kempf March 15, 2019, 1:44 PM

## 2019-03-16 ENCOUNTER — Encounter: Payer: Self-pay | Admitting: *Deleted

## 2019-03-16 NOTE — Progress Notes (Signed)
Oncology Nurse Navigator Documentation  Oncology Nurse Navigator Flowsheets 03/16/2019  Navigator Location [facility]  Referral Date to RadOnc/MedOnc -  Navigator Encounter Type Other/I followed up on Dr. Worthy Flank orders for Ethan Brown.  Patient needs to see Dr. Roxan Hockey at [facility] for pleur x cath placement.  I updated his office on referral.   Telephone -  Treatment Phase Pre-Tx/Tx Discussion  Barriers/Navigation Needs Coordination of Care  Education -  Interventions Coordination of Care  Coordination of Care Other  Education Method -  Acuity Level 1  Time Spent with Patient 15

## 2019-03-17 ENCOUNTER — Encounter: Payer: Self-pay | Admitting: *Deleted

## 2019-03-17 DIAGNOSIS — C3491 Malignant neoplasm of unspecified part of right bronchus or lung: Secondary | ICD-10-CM

## 2019-03-20 ENCOUNTER — Other Ambulatory Visit: Payer: Self-pay | Admitting: Thoracic Surgery (Cardiothoracic Vascular Surgery)

## 2019-03-20 ENCOUNTER — Encounter: Payer: Self-pay | Admitting: *Deleted

## 2019-03-20 DIAGNOSIS — C3491 Malignant neoplasm of unspecified part of right bronchus or lung: Secondary | ICD-10-CM

## 2019-03-20 NOTE — Progress Notes (Signed)
Oncology Nurse Navigator Documentation  Oncology Nurse Navigator Flowsheets 03/20/2019  Diagnosis Status Treatment Based on Clinical Diagnosis  Navigator Follow Up Date: 03/22/2019  Navigator Follow Up Reason: Review Note/I followed up with [facility] on if they have received any material for testing, they have not.  I contacted pathology and there is not enough material to send from thoracentesis on 7/1.  I was also updated that most recent thoracentesis which drained 1.9 L was discarded and not analysed.  I updated Dr. Julien Nordmann.  He would like Dr. Roxan Hockey to obtain more tissue.  I did contact Dr. Roxan Hockey with an update on Dr. Worthy Flank request.   Navigator Location [facility]  Referral Date to RadOnc/MedOnc -  Navigator Encounter Type Other  Telephone -  Abnormal Finding Date 01/18/2019  Confirmed Diagnosis Date 02/22/2019  Treatment Phase Pre-Tx/Tx Discussion  Barriers/Navigation Needs Coordination of Care  Education -  Interventions Coordination of Care  Coordination of Care Other  Education Method -  Acuity Level 3  Time Spent with Patient 35

## 2019-03-21 ENCOUNTER — Other Ambulatory Visit: Payer: Self-pay

## 2019-03-21 ENCOUNTER — Institutional Professional Consult (permissible substitution) (INDEPENDENT_AMBULATORY_CARE_PROVIDER_SITE_OTHER): Payer: Medicare Other | Admitting: Thoracic Surgery (Cardiothoracic Vascular Surgery)

## 2019-03-21 ENCOUNTER — Encounter: Payer: Self-pay | Admitting: Thoracic Surgery (Cardiothoracic Vascular Surgery)

## 2019-03-21 ENCOUNTER — Telehealth: Payer: Self-pay | Admitting: Medical Oncology

## 2019-03-21 ENCOUNTER — Ambulatory Visit
Admission: RE | Admit: 2019-03-21 | Discharge: 2019-03-21 | Disposition: A | Discharge status: Home / Self Care | Payer: Medicare Other | Source: Ambulatory Visit | Attending: Thoracic Surgery (Cardiothoracic Vascular Surgery) | Admitting: Thoracic Surgery (Cardiothoracic Vascular Surgery)

## 2019-03-21 VITALS — BP 141/94 | HR 87 | Temp 97.9°F | Resp 16 | Ht 71.0 in | Wt 194.0 lb

## 2019-03-21 DIAGNOSIS — J90 Pleural effusion, not elsewhere classified: Secondary | ICD-10-CM | POA: Diagnosis not present

## 2019-03-21 DIAGNOSIS — J910 Malignant pleural effusion: Secondary | ICD-10-CM

## 2019-03-21 DIAGNOSIS — C3491 Malignant neoplasm of unspecified part of right bronchus or lung: Secondary | ICD-10-CM | POA: Diagnosis not present

## 2019-03-21 DIAGNOSIS — R0602 Shortness of breath: Secondary | ICD-10-CM | POA: Diagnosis not present

## 2019-03-21 NOTE — Telephone Encounter (Signed)
I provided missing information for form submitted recently .

## 2019-03-21 NOTE — Progress Notes (Signed)
PCP is Laurey Morale, MD Referring Provider is Curt Bears, MD  Chief Complaint  Patient presents with  . Pleural Effusion    R Malignant...eval for R pleurX.Marland KitchenMarland KitchenRMLobe mass/Media/Hilar Adenopathy, thoracentesis 7/1, 03/13/19    HPI: Ethan Brown is sent for consultation regarding a malignant right pleural effusion.  Ethan Brown is a 68 year old non-smoker with a past medical history significant for borderline hypertension, hyperlipidemia, migraines, and transient global amnesia.  He has smoked an occasional cigar (3-4 times a year) but never has smoked cigarettes.  He was in his usual state of health until this past winter.  He developed a cough, which in itself was not unusual.  In March he had an episode of pleuritic chest pain but did not think much of it.  His cough persisted and he finally sought medical attention in June.  He had a chest x-ray which led to a CT of the chest.  CT of the chest showed a 3.4 x 2.4 cm right middle lobe mass extending into the right upper lobe.  There were multiple pleural densities and a moderate right pleural effusion.  He had a thoracentesis.  He passed out during the procedure and only 60 mL of fluid was drained.  Cytology was positive for metastatic adenocarcinoma, but there was not sufficient tissue for molecular testing.  He had a second thoracentesis on 03/13/2019 and 1.9 L of pleural fluid was drained.  Unfortunately that was not sent for cytology.  He saw Dr. Julien Nordmann.  Blood tests were done for molecular studies.  He felt better for couple of days after the thoracentesis.  However, he is starting to feel like the fluid may be building back up.   Past Medical History:  Diagnosis Date  . Borderline hypertension   . Borderline systolic hypertension   . Detached vitreous humor, left   . ED (erectile dysfunction) of organic origin   . Hyperlipidemia   . Lens subluxation, left   . Migraine   . Migraine headache    takes PRN Imitrex  .  Pseudophakia   . TGA (transient global amnesia) 2017  . Transient global amnesia     Past Surgical History:  Procedure Laterality Date  . CAROTID DOPPLERS  09/2017   Mild non-obstructive disease  . CATARACT EXTRACTION, BILATERAL    . cataract, left  2018  . COLONOSCOPY  2016   clear, repeat in 10 yrs   . Eye surgeries     , Lens attachment.  Vitrectomy  . FEMORAL HERNIA REPAIR    . HERNIA REPAIR    . IR THORACENTESIS ASP PLEURAL SPACE W/IMG GUIDE  02/22/2019  . IR THORACENTESIS ASP PLEURAL SPACE W/IMG GUIDE  03/13/2019  . LUMBAR LAMINECTOMY    . retinal attachment, right    . sclearl buckle, right    . victrectomy,right      Family History  Problem Relation Age of Onset  . Alcohol abuse Mother   . Diabetes Father   . Hypertension Father   . Prostate cancer Father   . Cancer Father   . Heart attack Maternal Grandfather 78    Social History Social History   Tobacco Use  . Smoking status: Never Smoker  . Smokeless tobacco: Never Used  Substance Use Topics  . Alcohol use: Yes    Alcohol/week: 1.0 standard drinks    Types: 1 Glasses of wine per week  . Drug use: No    Current Outpatient Medications  Medication Sig Dispense Refill  . Ascorbic  Acid (VITAMIN C) 100 MG tablet Take by mouth.    . B Complex Vitamins (B-COMPLEX/B-12) LIQD Place under the tongue.    . Multiple Vitamin (MULTIVITAMIN) capsule Take by mouth.    . sildenafil (REVATIO) 20 MG tablet Take as directed 10 tablet 11  . SUMAtriptan (IMITREX) 50 MG tablet Take 1 tablet by mouth once.    Marland Kitchen albuterol (VENTOLIN HFA) 108 (90 Base) MCG/ACT inhaler Inhale 2 puffs into the lungs every 4 (four) hours as needed for wheezing or shortness of breath. (Patient not taking: Reported on 03/15/2019) 1 Inhaler 2  . HYDROcodone-homatropine (HYDROMET) 5-1.5 MG/5ML syrup Take 5 mLs by mouth every 4 (four) hours as needed. (Patient not taking: Reported on 03/15/2019) 240 mL 0   No current facility-administered medications for  this visit.     No Known Allergies  Review of Systems  Constitutional: Positive for fatigue. Negative for activity change, appetite change and unexpected weight change.  HENT: Negative for trouble swallowing and voice change.   Respiratory: Positive for cough and shortness of breath.   Cardiovascular: Positive for chest pain (Right parasternal). Negative for leg swelling.  Gastrointestinal: Positive for abdominal pain (Reflux).  Genitourinary: Negative for difficulty urinating and dysuria.  Musculoskeletal: Negative for arthralgias and back pain.  Neurological: Negative for seizures and syncope.  Hematological: Negative for adenopathy. Does not bruise/bleed easily.  All other systems reviewed and are negative.   BP (!) 141/94 (BP Location: Right Arm, Patient Position: Sitting, Cuff Size: Normal)   Pulse 87   Temp 97.9 F (36.6 C) Comment: thermal  Resp 16   Ht 5\' 11"  (1.803 m)   Wt 194 lb (88 kg)   SpO2 95% Comment: RA  BMI 27.06 kg/m  Physical Exam Vitals signs reviewed.  Constitutional:      General: He is not in acute distress.    Appearance: Normal appearance.  HENT:     Head: Normocephalic and atraumatic.  Eyes:     General: No scleral icterus.    Extraocular Movements: Extraocular movements intact.     Conjunctiva/sclera: Conjunctivae normal.  Neck:     Musculoskeletal: Neck supple.  Cardiovascular:     Rate and Rhythm: Normal rate and regular rhythm.     Heart sounds: No murmur. No friction rub. No gallop.   Pulmonary:     Effort: Pulmonary effort is normal. No respiratory distress.     Breath sounds: No wheezing or rales.     Comments: Decreased breath sounds right base Abdominal:     General: There is no distension.     Palpations: Abdomen is soft.     Tenderness: There is no abdominal tenderness.  Musculoskeletal:        General: No swelling.  Lymphadenopathy:     Cervical: No cervical adenopathy.  Skin:    General: Skin is warm and dry.   Neurological:     General: No focal deficit present.     Mental Status: He is alert and oriented to person, place, and time.     Cranial Nerves: No cranial nerve deficit.     Motor: No weakness.     Coordination: Coordination normal.     Gait: Gait normal.    Diagnostic Tests: CT CHEST WITH CONTRAST  TECHNIQUE: Multidetector CT imaging of the chest was performed during intravenous contrast administration.  CONTRAST:  50mL ISOVUE-300 IOPAMIDOL (ISOVUE-300) INJECTION 61%  COMPARISON:  Chest radiographs dated 01/18/2019.  FINDINGS: Cardiovascular: No significant vascular findings. Normal heart size. No pericardial  effusion.  Mediastinum/Nodes: Multiple enlarged mediastinal and right hilar lymph nodes. These include a precarinal node with a short axis diameter of 19 mm on image number 64 series 2 and a right hilar node with a short axis diameter of 16 mm on image number 70 series 2. No enlarged axillary or left hilar nodes are seen. A 3 mm left lobe thyroid nodule is also noted.  Lungs/Pleura: A moderate sized right pleural effusion is demonstrated, increased. There is associated compressive atelectasis in the right lower lobe. An oval mass with mildly irregular margins and some spiculations is demonstrated in the medial aspect of the right middle lobe and extending into the upper lobe, measuring 3.4 x 2.4 cm on image number 78 of series 3. There are multiple pleural-based nodules in the right lung, including multiple nodules along the fissures measuring up to 8 mm in maximum diameter. There is also confluent soft tissue thickening in the medial aspect of the right lower lobe, abutting the pericardium, measuring 4.2 x 1.7 cm on image number 103 series 2 with similar changes extending into the adjacent lung. There are a small number of tiny pleural based nodules on the left. No left pleural fluid is seen.  Upper Abdomen: 1.2 cm oval, low density mass in the periphery  of the right lobe of the liver, measuring 40 Hounsfield units in density on image number 145 series 2. No other masses are seen in the included portions of the liver. Normal appearing adrenal glands. The remainder of the upper abdomen is unremarkable.  Musculoskeletal: Thoracic and lower cervical spine degenerative changes. No evidence of bony metastatic disease.  IMPRESSION: 1. 3.4 x 2.4 cm mass in the medial aspect of the right middle lobe and extending into the right upper lobe, compatible with a primary lung carcinoma. 2. Multiple pleural-based nodules in the right lung, compatible with pleural metastatic disease. 3. Confluent soft tissue thickening in the medial aspect of the right lower lobe, abutting the pericardium and in the adjacent right lower lobe parenchyma, suspicious for metastatic disease. 4. Moderate-sized right pleural effusion, increased. 5. Right lower lobe compressive atelectasis. 6. Small number of tiny left pleural based nodules. 7. 1.2 cm right lobe liver mass. This could represent a metastasis, primary liver neoplasm or complicated cyst.  These results will be called to the ordering clinician or representative by the Radiologist Assistant, and communication documented in the PACS or zVision Dashboard.   Electronically Signed   By: Claudie Revering M.D.   On: 02/10/2019 08:26 CHEST - 2 VIEW  COMPARISON:  March 13, 2019  FINDINGS: There is persistent pleural effusion on the right which may be slightly increased compared to recent study. There is patchy atelectasis in the right middle and lower lobe regions. The lungs elsewhere are clear. Heart size and pulmonary vascularity are normal. No adenopathy. No bone lesions. No pneumothorax.  IMPRESSION: Persistent right pleural effusion, marginally larger than on recent study. Atelectatic change in the right middle and lower lobes inferiorly. Lungs elsewhere clear. Stable cardiac  silhouette.   Electronically Signed   By: Lowella Grip III M.D.   On: 03/21/2019 15:47 I personally reviewed the CT and chest x-ray images and concur with the findings noted above  Impression: Ethan Brown is a 68 year old man with minimal history of tobacco abuse who was recently diagnosed with stage IV adenocarcinoma of the lung with a malignant right pleural effusion.  He has had persistent cough and shortness of breath for several months now.  He  had a thoracentesis about 8 days ago which drained 1.9 L of fluid.  His chest x-ray now shows some reaccumulation of that effusion.  There are 2 issues at hand currently.  One is to have adequate tissue for molecular testing.  Unfortunately there was not adequate tissue from his initial diagnostic specimen.  "Liquid biopsies" have been sent but Dr. Julien Nordmann would like additional tissue for further testing.  There are 2 ways to do that one would be to send additional pleural fluid cell block, but a more reliable way would be to do pleural biopsies to ensure there is adequate tissue for all testing that needs to be done.  Second issue is dealing with the malignant pleural effusion.  He has had some significant reaccumulation in 8days since his thoracentesis.  He did have symptomatic relief with a thoracentesis.  This is likely to continue to recur.  That certainly could be dealt with with repeated thoracenteses but usually that needs to be done about every 2 to 3 weeks.  Talk pleurodesis would be an option and also a pleural catheter would be an option.  Of those I favor the pleural catheter.  I recommended that we do a right VATS to drain his pleural effusion, do pleural biopsies, and place a pleural drainage catheter.  We would do this in the operating room under general anesthesia.  I would plan to keep him overnight afterwards.  I informed him of the general nature of the procedure including the need for general anesthesia, the incision to be  used, the use of an indwelling catheter for long-term management of the effusion, the expected overnight stay, and the overall recovery.  I informed him of the indications, risk, benefits, and alternatives.  He understands the risk include those associated with general anesthesia.  He understands the risk include, but not limited to death, MI, blood clots, infection, catheter occlusion, bleeding, as well as the possibility of other unforeseeable complications.  An alternative would be to place a pleural catheter under local anesthesia and send the fluid for additional cytology.  That can be done on an outpatient basis.  He wishes to talk to his wife who is an Therapist, sports before making any decisions.  Plan: Ethan Brown will call tomorrow after he has talked to his wife and decides how he would like to proceed.  [name], MD [facility] [phone]

## 2019-03-21 NOTE — H&P (View-Only) (Signed)
PCP is Laurey Morale, MD Referring Provider is Curt Bears, MD  Chief Complaint  Patient presents with  . Pleural Effusion    R Malignant...eval for R pleurX.Marland KitchenMarland KitchenRMLobe mass/Media/Hilar Adenopathy, thoracentesis 7/1, 03/13/19    HPI: [name] is sent for consultation regarding a malignant right pleural effusion.  Ethan Brown is a 68 year old non-smoker with a past medical history significant for borderline hypertension, hyperlipidemia, migraines, and transient global amnesia.  He has smoked an occasional cigar (3-4 times a year) but never has smoked cigarettes.  He was in his usual state of health until this past winter.  He developed a cough, which in itself was not unusual.  In March he had an episode of pleuritic chest pain but did not think much of it.  His cough persisted and he finally sought medical attention in June.  He had a chest x-ray which led to a CT of the chest.  CT of the chest showed a 3.4 x 2.4 cm right middle lobe mass extending into the right upper lobe.  There were multiple pleural densities and a moderate right pleural effusion.  He had a thoracentesis.  He passed out during the procedure and only 60 mL of fluid was drained.  Cytology was positive for metastatic adenocarcinoma, but there was not sufficient tissue for molecular testing.  He had a second thoracentesis on 03/13/2019 and 1.9 L of pleural fluid was drained.  Unfortunately that was not sent for cytology.  He saw Dr. Julien Nordmann.  Blood tests were done for molecular studies.  He felt better for couple of days after the thoracentesis.  However, he is starting to feel like the fluid may be building back up.   Past Medical History:  Diagnosis Date  . Borderline hypertension   . Borderline systolic hypertension   . Detached vitreous humor, left   . ED (erectile dysfunction) of organic origin   . Hyperlipidemia   . Lens subluxation, left   . Migraine   . Migraine headache    takes PRN Imitrex  .  Pseudophakia   . TGA (transient global amnesia) 2017  . Transient global amnesia     Past Surgical History:  Procedure Laterality Date  . CAROTID DOPPLERS  09/2017   Mild non-obstructive disease  . CATARACT EXTRACTION, BILATERAL    . cataract, left  2018  . COLONOSCOPY  2016   clear, repeat in 10 yrs   . Eye surgeries     , Lens attachment.  Vitrectomy  . FEMORAL HERNIA REPAIR    . HERNIA REPAIR    . IR THORACENTESIS ASP PLEURAL SPACE W/IMG GUIDE  02/22/2019  . IR THORACENTESIS ASP PLEURAL SPACE W/IMG GUIDE  03/13/2019  . LUMBAR LAMINECTOMY    . retinal attachment, right    . sclearl buckle, right    . victrectomy,right      Family History  Problem Relation Age of Onset  . Alcohol abuse Mother   . Diabetes Father   . Hypertension Father   . Prostate cancer Father   . Cancer Father   . Heart attack Maternal Grandfather 78    Social History Social History   Tobacco Use  . Smoking status: Never Smoker  . Smokeless tobacco: Never Used  Substance Use Topics  . Alcohol use: Yes    Alcohol/week: 1.0 standard drinks    Types: 1 Glasses of wine per week  . Drug use: No    Current Outpatient Medications  Medication Sig Dispense Refill  . Ascorbic  Acid (VITAMIN C) 100 MG tablet Take by mouth.    . B Complex Vitamins (B-COMPLEX/B-12) LIQD Place under the tongue.    . Multiple Vitamin (MULTIVITAMIN) capsule Take by mouth.    . sildenafil (REVATIO) 20 MG tablet Take as directed 10 tablet 11  . SUMAtriptan (IMITREX) 50 MG tablet Take 1 tablet by mouth once.    Marland Kitchen albuterol (VENTOLIN HFA) 108 (90 Base) MCG/ACT inhaler Inhale 2 puffs into the lungs every 4 (four) hours as needed for wheezing or shortness of breath. (Patient not taking: Reported on 03/15/2019) 1 Inhaler 2  . HYDROcodone-homatropine (HYDROMET) 5-1.5 MG/5ML syrup Take 5 mLs by mouth every 4 (four) hours as needed. (Patient not taking: Reported on 03/15/2019) 240 mL 0   No current facility-administered medications for  this visit.     No Known Allergies  Review of Systems  Constitutional: Positive for fatigue. Negative for activity change, appetite change and unexpected weight change.  HENT: Negative for trouble swallowing and voice change.   Respiratory: Positive for cough and shortness of breath.   Cardiovascular: Positive for chest pain (Right parasternal). Negative for leg swelling.  Gastrointestinal: Positive for abdominal pain (Reflux).  Genitourinary: Negative for difficulty urinating and dysuria.  Musculoskeletal: Negative for arthralgias and back pain.  Neurological: Negative for seizures and syncope.  Hematological: Negative for adenopathy. Does not bruise/bleed easily.  All other systems reviewed and are negative.   BP (!) 141/94 (BP Location: Right Arm, Patient Position: Sitting, Cuff Size: Normal)   Pulse 87   Temp 97.9 F (36.6 C) Comment: thermal  Resp 16   Ht 5\' 11"  (1.803 m)   Wt 194 lb (88 kg)   SpO2 95% Comment: RA  BMI 27.06 kg/m  Physical Exam Vitals signs reviewed.  Constitutional:      General: He is not in acute distress.    Appearance: Normal appearance.  HENT:     Head: Normocephalic and atraumatic.  Eyes:     General: No scleral icterus.    Extraocular Movements: Extraocular movements intact.     Conjunctiva/sclera: Conjunctivae normal.  Neck:     Musculoskeletal: Neck supple.  Cardiovascular:     Rate and Rhythm: Normal rate and regular rhythm.     Heart sounds: No murmur. No friction rub. No gallop.   Pulmonary:     Effort: Pulmonary effort is normal. No respiratory distress.     Breath sounds: No wheezing or rales.     Comments: Decreased breath sounds right base Abdominal:     General: There is no distension.     Palpations: Abdomen is soft.     Tenderness: There is no abdominal tenderness.  Musculoskeletal:        General: No swelling.  Lymphadenopathy:     Cervical: No cervical adenopathy.  Skin:    General: Skin is warm and dry.   Neurological:     General: No focal deficit present.     Mental Status: He is alert and oriented to person, place, and time.     Cranial Nerves: No cranial nerve deficit.     Motor: No weakness.     Coordination: Coordination normal.     Gait: Gait normal.    Diagnostic Tests: CT CHEST WITH CONTRAST  TECHNIQUE: Multidetector CT imaging of the chest was performed during intravenous contrast administration.  CONTRAST:  74mL ISOVUE-300 IOPAMIDOL (ISOVUE-300) INJECTION 61%  COMPARISON:  Chest radiographs dated 01/18/2019.  FINDINGS: Cardiovascular: No significant vascular findings. Normal heart size. No pericardial  effusion.  Mediastinum/Nodes: Multiple enlarged mediastinal and right hilar lymph nodes. These include a precarinal node with a short axis diameter of 19 mm on image number 64 series 2 and a right hilar node with a short axis diameter of 16 mm on image number 70 series 2. No enlarged axillary or left hilar nodes are seen. A 3 mm left lobe thyroid nodule is also noted.  Lungs/Pleura: A moderate sized right pleural effusion is demonstrated, increased. There is associated compressive atelectasis in the right lower lobe. An oval mass with mildly irregular margins and some spiculations is demonstrated in the medial aspect of the right middle lobe and extending into the upper lobe, measuring 3.4 x 2.4 cm on image number 78 of series 3. There are multiple pleural-based nodules in the right lung, including multiple nodules along the fissures measuring up to 8 mm in maximum diameter. There is also confluent soft tissue thickening in the medial aspect of the right lower lobe, abutting the pericardium, measuring 4.2 x 1.7 cm on image number 103 series 2 with similar changes extending into the adjacent lung. There are a small number of tiny pleural based nodules on the left. No left pleural fluid is seen.  Upper Abdomen: 1.2 cm oval, low density mass in the periphery  of the right lobe of the liver, measuring 40 Hounsfield units in density on image number 145 series 2. No other masses are seen in the included portions of the liver. Normal appearing adrenal glands. The remainder of the upper abdomen is unremarkable.  Musculoskeletal: Thoracic and lower cervical spine degenerative changes. No evidence of bony metastatic disease.  IMPRESSION: 1. 3.4 x 2.4 cm mass in the medial aspect of the right middle lobe and extending into the right upper lobe, compatible with a primary lung carcinoma. 2. Multiple pleural-based nodules in the right lung, compatible with pleural metastatic disease. 3. Confluent soft tissue thickening in the medial aspect of the right lower lobe, abutting the pericardium and in the adjacent right lower lobe parenchyma, suspicious for metastatic disease. 4. Moderate-sized right pleural effusion, increased. 5. Right lower lobe compressive atelectasis. 6. Small number of tiny left pleural based nodules. 7. 1.2 cm right lobe liver mass. This could represent a metastasis, primary liver neoplasm or complicated cyst.  These results will be called to the ordering clinician or representative by the Radiologist Assistant, and communication documented in the PACS or zVision Dashboard.   Electronically Signed   By: Claudie Revering M.D.   On: 02/10/2019 08:26 CHEST - 2 VIEW  COMPARISON:  March 13, 2019  FINDINGS: There is persistent pleural effusion on the right which may be slightly increased compared to recent study. There is patchy atelectasis in the right middle and lower lobe regions. The lungs elsewhere are clear. Heart size and pulmonary vascularity are normal. No adenopathy. No bone lesions. No pneumothorax.  IMPRESSION: Persistent right pleural effusion, marginally larger than on recent study. Atelectatic change in the right middle and lower lobes inferiorly. Lungs elsewhere clear. Stable cardiac  silhouette.   Electronically Signed   By: Lowella Grip III M.D.   On: 03/21/2019 15:47 I personally reviewed the CT and chest x-ray images and concur with the findings noted above  Impression: Ethan Brown is a 68 year old man with minimal history of tobacco abuse who was recently diagnosed with stage IV adenocarcinoma of the lung with a malignant right pleural effusion.  He has had persistent cough and shortness of breath for several months now.  He  had a thoracentesis about 8 days ago which drained 1.9 L of fluid.  His chest x-ray now shows some reaccumulation of that effusion.  There are 2 issues at hand currently.  One is to have adequate tissue for molecular testing.  Unfortunately there was not adequate tissue from his initial diagnostic specimen.  "Liquid biopsies" have been sent but Dr. Julien Nordmann would like additional tissue for further testing.  There are 2 ways to do that one would be to send additional pleural fluid cell block, but a more reliable way would be to do pleural biopsies to ensure there is adequate tissue for all testing that needs to be done.  Second issue is dealing with the malignant pleural effusion.  He has had some significant reaccumulation in 8days since his thoracentesis.  He did have symptomatic relief with a thoracentesis.  This is likely to continue to recur.  That certainly could be dealt with with repeated thoracenteses but usually that needs to be done about every 2 to 3 weeks.  Talk pleurodesis would be an option and also a pleural catheter would be an option.  Of those I favor the pleural catheter.  I recommended that we do a right VATS to drain his pleural effusion, do pleural biopsies, and place a pleural drainage catheter.  We would do this in the operating room under general anesthesia.  I would plan to keep him overnight afterwards.  I informed him of the general nature of the procedure including the need for general anesthesia, the incision to be  used, the use of an indwelling catheter for long-term management of the effusion, the expected overnight stay, and the overall recovery.  I informed him of the indications, risk, benefits, and alternatives.  He understands the risk include those associated with general anesthesia.  He understands the risk include, but not limited to death, MI, blood clots, infection, catheter occlusion, bleeding, as well as the possibility of other unforeseeable complications.  An alternative would be to place a pleural catheter under local anesthesia and send the fluid for additional cytology.  That can be done on an outpatient basis.  He wishes to talk to his wife who is an Therapist, sports before making any decisions.  Plan: Mr. Griep will call tomorrow after he has talked to his wife and decides how he would like to proceed.  [name], MD [facility] [phone]

## 2019-03-22 ENCOUNTER — Other Ambulatory Visit: Payer: Self-pay | Admitting: *Deleted

## 2019-03-22 ENCOUNTER — Encounter: Payer: Self-pay | Admitting: *Deleted

## 2019-03-22 DIAGNOSIS — J90 Pleural effusion, not elsewhere classified: Secondary | ICD-10-CM

## 2019-03-22 NOTE — Progress Notes (Signed)
Oncology Nurse Navigator Documentation  Oncology Nurse Navigator Flowsheets 03/22/2019  Diagnosis Status -  Navigator Follow Up Date: -  Navigator Follow Up Reason: -  Navigator Location [facility]  Referral Date to RadOnc/MedOnc -  Navigator Encounter Type Other/I contacted [facility] to get an estimated time for results.  I was told completed results should be completed by 7/31.  Patient is set up to see Dr. Julien Nordmann on 8/4 with MRI brain and PET on 8/3.  I did update Dr. Julien Nordmann on result time.  No new orders at this time.   Telephone -  Abnormal Finding Date -  Confirmed Diagnosis Date -  Treatment Phase Pre-Tx/Tx Discussion  Barriers/Navigation Needs Coordination of Care  Education -  Interventions Coordination of Care  Coordination of Care Other  Education Method -  Acuity Level 2  Time Spent with Patient 30

## 2019-03-27 ENCOUNTER — Other Ambulatory Visit (HOSPITAL_COMMUNITY)
Admission: RE | Admit: 2019-03-27 | Discharge: 2019-03-27 | Disposition: A | Discharge status: Home / Self Care | Payer: Medicare Other | Source: Ambulatory Visit | Attending: Thoracic Surgery (Cardiothoracic Vascular Surgery) | Admitting: Thoracic Surgery (Cardiothoracic Vascular Surgery)

## 2019-03-27 ENCOUNTER — Other Ambulatory Visit: Payer: Self-pay

## 2019-03-27 ENCOUNTER — Ambulatory Visit: Payer: Medicare Other | Admitting: Emergency Medicine

## 2019-03-27 ENCOUNTER — Encounter: Payer: Self-pay | Admitting: *Deleted

## 2019-03-27 ENCOUNTER — Inpatient Hospital Stay (HOSPITAL_COMMUNITY)
Admission: RE | Admit: 2019-03-27 | Discharge: 2019-03-27 | Disposition: A | Discharge status: Home / Self Care | Payer: Medicare Other | Source: Ambulatory Visit | Attending: Physician Assistant | Admitting: Physician Assistant

## 2019-03-27 DIAGNOSIS — C782 Secondary malignant neoplasm of pleura: Secondary | ICD-10-CM | POA: Diagnosis not present

## 2019-03-27 DIAGNOSIS — C3490 Malignant neoplasm of unspecified part of unspecified bronchus or lung: Secondary | ICD-10-CM

## 2019-03-27 DIAGNOSIS — C779 Secondary and unspecified malignant neoplasm of lymph node, unspecified: Secondary | ICD-10-CM | POA: Diagnosis not present

## 2019-03-27 DIAGNOSIS — C713 Malignant neoplasm of parietal lobe: Secondary | ICD-10-CM | POA: Diagnosis not present

## 2019-03-27 DIAGNOSIS — Z20822 Contact with and (suspected) exposure to covid-19: Secondary | ICD-10-CM

## 2019-03-27 LAB — GLUCOSE, CAPILLARY: Glucose-Capillary: 98 mg/dL (ref 70–99)

## 2019-03-27 LAB — SARS CORONAVIRUS 2 (TAT 6-24 HRS): SARS Coronavirus 2: NEGATIVE

## 2019-03-27 MED ORDER — FLUDEOXYGLUCOSE F - 18 (FDG) INJECTION
9.5700 | Freq: Once | INTRAVENOUS | Status: AC | PRN
  Administered 2019-03-27: 9.57 via INTRAVENOUS

## 2019-03-27 MED ORDER — GADOBUTROL 1 MMOL/ML IV SOLN
10.0000 mL | Freq: Once | INTRAVENOUS | Status: AC | PRN
  Administered 2019-03-27: 11:00:00 7.5 mL via INTRAVENOUS

## 2019-03-27 NOTE — Progress Notes (Signed)
[id number] - [place], [place] - [address] AT [address] [place] [address] Phone: [phone] Fax: [phone]      Your procedure is scheduled on August 5th.  Report to [facility] Main Entrance "A" at 10:45 A.M., and check in at the Admitting office.  Call this number if you have problems the morning of surgery:  [phone]  Call [phone] if you have any questions prior to your surgery date Monday-Friday 8am-4pm    Remember:  Do not eat or drink after midnight the night before your surgery     Take these medicines the morning of surgery with A SIP OF WATER   Albuterol Inhaler - if needed  7 days prior to surgery STOP taking any Aspirin (unless otherwise instructed by your surgeon), Aleve, Naproxen, Ibuprofen, Motrin, Advil, Goody's, BC's, all herbal medications, fish oil, and all vitamins.    The Morning of Surgery  Do not wear jewelry.  Do not wear lotions, powders, colognes, or deodorant  Men may shave face and neck.  Do not bring valuables to the hospital.  [facility] is not responsible for any belongings or valuables.  If you are a smoker, DO NOT Smoke 24 hours prior to surgery IF you wear a CPAP at night please bring your mask, tubing, and machine the morning of surgery   Remember that you must have someone to transport you home after your surgery, and remain with you for 24 hours if you are discharged the same day.   Contacts, glasses, hearing aids, dentures or bridgework may not be worn into surgery.    Leave your suitcase in the car.  After surgery it may be brought to your room.  For patients admitted to the hospital, discharge time will be determined by your treatment team.  Patients discharged the day of surgery will not be allowed to drive home.    Special instructions:   Agenda- Preparing For Surgery  Before surgery, you can play an important role. Because skin is not  sterile, your skin needs to be as free of germs as possible. You can reduce the number of germs on your skin by washing with CHG (chlorahexidine gluconate) Soap before surgery.  CHG is an antiseptic cleaner which kills germs and bonds with the skin to continue killing germs even after washing.    Oral Hygiene is also important to reduce your risk of infection.  Remember - BRUSH YOUR TEETH THE MORNING OF SURGERY WITH YOUR REGULAR TOOTHPASTE  Please do not use if you have an allergy to CHG or antibacterial soaps. If your skin becomes reddened/irritated stop using the CHG.  Do not shave (including legs and underarms) for at least 48 hours prior to first CHG shower. It is OK to shave your face.  Please follow these instructions carefully.   1. Shower the NIGHT BEFORE SURGERY and the MORNING OF SURGERY with CHG Soap.   2. If you chose to wash your hair, wash your hair first as usual with your normal shampoo.  3. After you shampoo, rinse your hair and body thoroughly to remove the shampoo.  4. Use CHG as you would any other liquid soap. You can apply CHG directly to the skin and wash gently with a scrungie or a clean washcloth.   5. Apply the CHG Soap to your body ONLY FROM THE NECK DOWN.  Do not use on open wounds or open sores. Avoid contact with your eyes, ears,  mouth and genitals (private parts). Wash Face and genitals (private parts)  with your normal soap.   6. Wash thoroughly, paying special attention to the area where your surgery will be performed.  7. Thoroughly rinse your body with warm water from the neck down.  8. DO NOT shower/wash with your normal soap after using and rinsing off the CHG Soap.  9. Pat yourself dry with a CLEAN TOWEL.  10. Wear CLEAN PAJAMAS to bed the night before surgery, wear comfortable clothes the morning of surgery  11. Place CLEAN SHEETS on your bed the night of your first shower and DO NOT SLEEP WITH PETS.    Day of Surgery:  Do not apply any  deodorants/lotions. Please shower the morning of surgery with the CHG soap  Please wear clean clothes to the hospital/surgery center.   Remember to brush your teeth WITH YOUR REGULAR TOOTHPASTE.   Please read over the following fact sheets that you were given.

## 2019-03-27 NOTE — Progress Notes (Signed)
Oncology Nurse Navigator Documentation  Oncology Nurse Navigator Flowsheets 03/27/2019  Diagnosis Status -  Navigator Follow Up Date: -  Navigator Follow Up Reason: -  Navigator Location [facility]  Referral Date to RadOnc/MedOnc -  Navigator Encounter Type Other/I updated Dr. Julien Nordmann on [facility] results. Patient has a follow up with Dr. Julien Nordmann tomorrow.   Telephone -  Abnormal Finding Date -  Confirmed Diagnosis Date -  Treatment Phase Pre-Tx/Tx Discussion  Barriers/Navigation Needs Coordination of Care  Education -  Interventions Coordination of Care  Coordination of Care Other  Education Method -  Acuity Level 2  Time Spent with Patient 30

## 2019-03-28 ENCOUNTER — Other Ambulatory Visit: Payer: Self-pay | Admitting: Radiation Therapy

## 2019-03-28 ENCOUNTER — Encounter: Payer: Self-pay | Admitting: *Deleted

## 2019-03-28 ENCOUNTER — Other Ambulatory Visit: Payer: Self-pay | Admitting: *Deleted

## 2019-03-28 ENCOUNTER — Other Ambulatory Visit: Payer: Self-pay

## 2019-03-28 ENCOUNTER — Encounter: Payer: Self-pay | Admitting: Radiation Oncology

## 2019-03-28 ENCOUNTER — Inpatient Hospital Stay: Payer: Medicare Other | Attending: Internal Medicine | Admitting: Internal Medicine

## 2019-03-28 ENCOUNTER — Telehealth: Payer: Self-pay | Admitting: Internal Medicine

## 2019-03-28 ENCOUNTER — Encounter (HOSPITAL_COMMUNITY)
Admission: RE | Admit: 2019-03-28 | Discharge: 2019-03-28 | Disposition: A | Discharge status: Home / Self Care | Payer: Medicare Other | Source: Ambulatory Visit | Attending: Thoracic Surgery (Cardiothoracic Vascular Surgery) | Admitting: Thoracic Surgery (Cardiothoracic Vascular Surgery)

## 2019-03-28 ENCOUNTER — Ambulatory Visit
Admission: RE | Admit: 2019-03-28 | Discharge: 2019-03-28 | Disposition: A | Discharge status: Home / Self Care | Payer: Medicare Other | Source: Ambulatory Visit | Attending: Radiation Oncology | Admitting: Radiation Oncology

## 2019-03-28 ENCOUNTER — Encounter (HOSPITAL_COMMUNITY): Payer: Self-pay

## 2019-03-28 VITALS — Ht 71.0 in | Wt 196.0 lb

## 2019-03-28 VITALS — BP 128/89 | HR 87 | Temp 98.3°F | Resp 18 | Ht 71.0 in | Wt 197.7 lb

## 2019-03-28 DIAGNOSIS — Z79899 Other long term (current) drug therapy: Secondary | ICD-10-CM | POA: Insufficient documentation

## 2019-03-28 DIAGNOSIS — C3491 Malignant neoplasm of unspecified part of right bronchus or lung: Secondary | ICD-10-CM

## 2019-03-28 DIAGNOSIS — J910 Malignant pleural effusion: Secondary | ICD-10-CM | POA: Diagnosis not present

## 2019-03-28 DIAGNOSIS — Z7189 Other specified counseling: Secondary | ICD-10-CM | POA: Diagnosis not present

## 2019-03-28 DIAGNOSIS — C7931 Secondary malignant neoplasm of brain: Secondary | ICD-10-CM | POA: Insufficient documentation

## 2019-03-28 DIAGNOSIS — C3481 Malignant neoplasm of overlapping sites of right bronchus and lung: Secondary | ICD-10-CM | POA: Diagnosis not present

## 2019-03-28 DIAGNOSIS — C782 Secondary malignant neoplasm of pleura: Secondary | ICD-10-CM | POA: Insufficient documentation

## 2019-03-28 DIAGNOSIS — C3490 Malignant neoplasm of unspecified part of unspecified bronchus or lung: Secondary | ICD-10-CM | POA: Insufficient documentation

## 2019-03-28 DIAGNOSIS — Z5111 Encounter for antineoplastic chemotherapy: Secondary | ICD-10-CM | POA: Diagnosis not present

## 2019-03-28 DIAGNOSIS — C799 Secondary malignant neoplasm of unspecified site: Secondary | ICD-10-CM | POA: Diagnosis not present

## 2019-03-28 DIAGNOSIS — J90 Pleural effusion, not elsewhere classified: Secondary | ICD-10-CM

## 2019-03-28 DIAGNOSIS — C7949 Secondary malignant neoplasm of other parts of nervous system: Secondary | ICD-10-CM

## 2019-03-28 DIAGNOSIS — C3411 Malignant neoplasm of upper lobe, right bronchus or lung: Secondary | ICD-10-CM | POA: Insufficient documentation

## 2019-03-28 DIAGNOSIS — G43909 Migraine, unspecified, not intractable, without status migrainosus: Secondary | ICD-10-CM | POA: Diagnosis not present

## 2019-03-28 DIAGNOSIS — C787 Secondary malignant neoplasm of liver and intrahepatic bile duct: Secondary | ICD-10-CM | POA: Insufficient documentation

## 2019-03-28 DIAGNOSIS — Z20828 Contact with and (suspected) exposure to other viral communicable diseases: Secondary | ICD-10-CM | POA: Diagnosis not present

## 2019-03-28 DIAGNOSIS — Z5112 Encounter for antineoplastic immunotherapy: Secondary | ICD-10-CM

## 2019-03-28 DIAGNOSIS — Z8042 Family history of malignant neoplasm of prostate: Secondary | ICD-10-CM | POA: Diagnosis not present

## 2019-03-28 DIAGNOSIS — R5382 Chronic fatigue, unspecified: Secondary | ICD-10-CM | POA: Diagnosis not present

## 2019-03-28 DIAGNOSIS — Z720 Tobacco use: Secondary | ICD-10-CM | POA: Diagnosis not present

## 2019-03-28 DIAGNOSIS — N528 Other male erectile dysfunction: Secondary | ICD-10-CM | POA: Diagnosis not present

## 2019-03-28 HISTORY — DX: Malignant neoplasm of unspecified part of unspecified bronchus or lung: C34.90

## 2019-03-28 HISTORY — DX: Dyspnea, unspecified: R06.00

## 2019-03-28 LAB — COMPREHENSIVE METABOLIC PANEL
ALT: 21 U/L (ref 0–44)
AST: 25 U/L (ref 15–41)
Albumin: 3.6 g/dL (ref 3.5–5.0)
Alkaline Phosphatase: 55 U/L (ref 38–126)
Anion gap: 11 (ref 5–15)
BUN: 20 mg/dL (ref 8–23)
CO2: 18 mmol/L — ABNORMAL LOW (ref 22–32)
Calcium: 9 mg/dL (ref 8.9–10.3)
Chloride: 110 mmol/L (ref 98–111)
Creatinine, Ser: 0.9 mg/dL (ref 0.61–1.24)
GFR calc Af Amer: >60 mL/min (ref >60)
GFR calc non Af Amer: >60 mL/min (ref >60)
Glucose, Bld: 110 mg/dL — ABNORMAL HIGH (ref 70–99)
Potassium: 4.5 mmol/L (ref 3.5–5.1)
Sodium: 139 mmol/L (ref 135–145)
Total Bilirubin: 0.5 mg/dL (ref 0.3–1.2)
Total Protein: 6.5 g/dL (ref 6.5–8.1)

## 2019-03-28 LAB — PROTIME-INR
INR: 1 (ref 0.8–1.2)
Prothrombin Time: 13.5 seconds (ref 11.4–15.2)

## 2019-03-28 LAB — URINALYSIS, ROUTINE W REFLEX MICROSCOPIC
Bacteria, UA: NONE SEEN
Bilirubin Urine: NEGATIVE
Glucose, UA: NEGATIVE mg/dL
Ketones, ur: NEGATIVE mg/dL
Leukocytes,Ua: NEGATIVE
Nitrite: NEGATIVE
Protein, ur: NEGATIVE mg/dL
Specific Gravity, Urine: 1.028 (ref 1.005–1.030)
pH: 5 (ref 5.0–8.0)

## 2019-03-28 LAB — TYPE AND SCREEN
ABO/RH(D): A POS
Antibody Screen: NEGATIVE

## 2019-03-28 LAB — CBC
HCT: 48.3 % (ref 39.0–52.0)
Hemoglobin: 16.3 g/dL (ref 13.0–17.0)
MCH: 31.7 pg (ref 26.0–34.0)
MCHC: 33.7 g/dL (ref 30.0–36.0)
MCV: 93.8 fL (ref 80.0–100.0)
Platelets: 264 10*3/uL (ref 150–400)
RBC: 5.15 MIL/uL (ref 4.22–5.81)
RDW: 12 % (ref 11.5–15.5)
WBC: 7.5 10*3/uL (ref 4.0–10.5)
nRBC: 0 % (ref 0.0–0.2)

## 2019-03-28 LAB — APTT: aPTT: 28 seconds (ref 24–36)

## 2019-03-28 LAB — BLOOD GAS, ARTERIAL
Acid-base deficit: 1.7 mmol/L (ref 0.0–2.0)
Bicarbonate: 22.3 mmol/L (ref 20.0–28.0)
FIO2: 21
O2 Saturation: 96.7 %
Patient temperature: 98.6
pCO2 arterial: 35.8 mmHg (ref 32.0–48.0)
pH, Arterial: 7.411 (ref 7.350–7.450)
pO2, Arterial: 89.5 mmHg (ref 83.0–108.0)

## 2019-03-28 LAB — ABO/RH: ABO/RH(D): A POS

## 2019-03-28 LAB — SURGICAL PCR SCREEN
MRSA, PCR: NEGATIVE
Staphylococcus aureus: NEGATIVE

## 2019-03-28 MED ORDER — FOLIC ACID 1 MG PO TABS: 1.0000 mg | ORAL_TABLET | Freq: Every day | ORAL | 4 refills | Status: DC

## 2019-03-28 MED ORDER — LIDOCAINE-PRILOCAINE 2.5-2.5 % EX CREA: TOPICAL_CREAM | CUTANEOUS | 0 refills | Status: DC

## 2019-03-28 MED ORDER — PROCHLORPERAZINE MALEATE 10 MG PO TABS: 10.0000 mg | ORAL_TABLET | Freq: Four times a day (QID) | ORAL | 0 refills | Status: DC | PRN

## 2019-03-28 NOTE — Progress Notes (Signed)
[place] [phone]   [phone]  OFFICE PROGRESS NOTE  Laurey Morale, MD [place] [address]  DIAGNOSIS: Stage IV (T2b, N2, M1c) presented with right middle lobe lung mass in addition to mediastinal lymphadenopathy as well as malignant right pleural effusion with pleural-based nodules and suspicious right hepatic lesion and the brain metastasis diagnosed in July 2020.  Molecular Biomarkers: [id number] (Exon 20 insertion) 0.2% Dacomitinib,Neratinib,Osimertinib  [id number] 0.1% None    PRIOR THERAPY: None  CURRENT THERAPY: Systemic chemotherapy with carboplatin for AUC of 5, Alimta 500 mg/M2 and Keytruda 200 mg IV every 3 weeks.  First dose April 04, 2019.  INTERVAL HISTORY: Ethan Brown 68 y.o. male returns to the clinic today for follow-up visit.  The patient is feeling fine today with no concerning complaints except for mild shortness of breath with exertion and mild cough.  He denied having any current chest pain or hemoptysis.  He denied having any recent weight loss or night sweats.  He has no nausea, vomiting, diarrhea or constipation.  He denied having any headache or visual changes.  He has no fever or chills.  He is expected to have Pleurx catheter placement tomorrow by Dr. Roxan Hockey.  The patient had several studies performed recently including a PET scan as well as MRI of the brain as well as molecular studies by [employer].  He is here today for evaluation and discussion of his treatment options based on the recent staging work-up and molecular studies.  MEDICAL HISTORY: Past Medical History:  Diagnosis Date  . Borderline hypertension   . Borderline systolic hypertension   . Detached vitreous humor, left   . ED (erectile dysfunction) of organic origin   . Hyperlipidemia   . Lens subluxation, left   . Migraine   . Migraine headache    takes PRN Imitrex  . Pseudophakia   . TGA  (transient global amnesia) 2017  . Transient global amnesia     ALLERGIES:  has No Known Allergies.  MEDICATIONS:  Current Outpatient Medications  Medication Sig Dispense Refill  . albuterol (VENTOLIN HFA) 108 (90 Base) MCG/ACT inhaler Inhale 2 puffs into the lungs every 4 (four) hours as needed for wheezing or shortness of breath. 1 Inhaler 2  . Cyanocobalamin (B-12) 1000 MCG/ML KIT Inject 1,000 mcg as directed every 30 (thirty) days.    . SUMAtriptan (IMITREX) 50 MG tablet Take 50 mg by mouth every 2 (two) hours as needed for migraine.      No current facility-administered medications for this visit.     SURGICAL HISTORY:  Past Surgical History:  Procedure Laterality Date  . CAROTID DOPPLERS  09/2017   Mild non-obstructive disease  . CATARACT EXTRACTION, BILATERAL    . cataract, left  2018  . COLONOSCOPY  2016   clear, repeat in 10 yrs   . Eye surgeries     , Lens attachment.  Vitrectomy  . FEMORAL HERNIA REPAIR    . HERNIA REPAIR    . IR THORACENTESIS ASP PLEURAL SPACE W/IMG GUIDE  02/22/2019  . IR THORACENTESIS ASP PLEURAL SPACE W/IMG GUIDE  03/13/2019  . LUMBAR LAMINECTOMY    . retinal attachment, right    . sclearl buckle, right    . victrectomy,right      REVIEW OF SYSTEMS:  Constitutional: positive for fatigue Eyes: negative Ears, nose, mouth, throat, and face: negative Respiratory: positive for cough and dyspnea on exertion Cardiovascular: negative Gastrointestinal:  negative Genitourinary:negative Integument/breast: negative Hematologic/lymphatic: negative Musculoskeletal:negative Neurological: negative Behavioral/Psych: negative Endocrine: negative Allergic/Immunologic: negative   PHYSICAL EXAMINATION: General appearance: alert, cooperative, fatigued and no distress Head: Normocephalic, without obvious abnormality, atraumatic Neck: no adenopathy, no JVD, supple, symmetrical, trachea midline and thyroid not enlarged, symmetric, no  tenderness/mass/nodules Lymph nodes: Cervical, supraclavicular, and axillary nodes normal. Resp: clear to auscultation bilaterally Back: symmetric, no curvature. ROM normal. No CVA tenderness. Cardio: regular rate and rhythm, S1, S2 normal, no murmur, click, rub or gallop GI: soft, non-tender; bowel sounds normal; no masses,  no organomegaly Extremities: extremities normal, atraumatic, no cyanosis or edema Neurologic: Alert and oriented X 3, normal strength and tone. Normal symmetric reflexes. Normal coordination and gait  ECOG PERFORMANCE STATUS: 1 - Symptomatic but completely ambulatory  Blood pressure 128/89, pulse 87, temperature 98.3 F (36.8 C), temperature source Oral, resp. rate 18, height '5\' 11"'  (1.803 m), weight 197 lb 11.2 oz (89.7 kg).  LABORATORY DATA: Lab Results  Component Value Date   WBC 5.3 03/15/2019   HGB 13.9 03/15/2019   HCT 41.9 03/15/2019   MCV 94.8 03/15/2019   PLT 201 03/15/2019      Chemistry      Component Value Date/Time   NA 141 03/15/2019 1327   K 4.3 03/15/2019 1327   CL 109 03/15/2019 1327   CO2 25 03/15/2019 1327   BUN 19 03/15/2019 1327   CREATININE 1.10 03/15/2019 1327      Component Value Date/Time   CALCIUM 8.7 (L) 03/15/2019 1327   ALKPHOS 67 03/15/2019 1327   AST 16 03/15/2019 1327   ALT 14 03/15/2019 1327   BILITOT 0.3 03/15/2019 1327       RADIOGRAPHIC STUDIES: Dg Chest 1 View  Result Date: 03/13/2019 CLINICAL DATA:  Post right thoracentesis EXAM: CHEST  1 VIEW COMPARISON:  02/22/2019 FINDINGS: Decreasing right effusion and right base atelectasis. No pneumothorax following thoracentesis. Left lung clear. Heart is normal size. No acute bony abnormality. IMPRESSION: Small right effusion and right base atelectasis, both improving since prior study. No pneumothorax following thoracentesis. Electronically Signed   By: Rolm Baptise M.D.   On: 03/13/2019 11:06   Dg Chest 2 View  Result Date: 03/21/2019 CLINICAL DATA:  Shortness of  breath and pleural effusion. History of lung carcinoma EXAM: CHEST - 2 VIEW COMPARISON:  March 13, 2019 FINDINGS: There is persistent pleural effusion on the right which may be slightly increased compared to recent study. There is patchy atelectasis in the right middle and lower lobe regions. The lungs elsewhere are clear. Heart size and pulmonary vascularity are normal. No adenopathy. No bone lesions. No pneumothorax. IMPRESSION: Persistent right pleural effusion, marginally larger than on recent study. Atelectatic change in the right middle and lower lobes inferiorly. Lungs elsewhere clear. Stable cardiac silhouette. Electronically Signed   By: Lowella Grip III M.D.   On: 03/21/2019 15:47   Mr Jeri Cos WU Contrast  Result Date: 03/27/2019 CLINICAL DATA:  68 year old male initial staging, lung cancer with malignant pleural effusion. EXAM: MRI HEAD WITHOUT AND WITH CONTRAST TECHNIQUE: Multiplanar, multiecho pulse sequences of the brain and surrounding structures were obtained without and with intravenous contrast. CONTRAST:  7.5 milliliters Gadavist COMPARISON:  PET-CT today reported separately. FINDINGS: Brain: There is a round 6-7 millimeter enhancing lesion at the right sensory strip on series 11, image 77 and series 13, image 6. No associated edema or mass effect. There is faint hemosiderin within the lesion on series 7, image 24. No other abnormal enhancement  identified.  No dural thickening. No restricted diffusion to suggest acute infarction. No midline shift, mass effect, ventriculomegaly, extra-axial collection or acute intracranial hemorrhage. Cervicomedullary junction and pituitary are within normal limits. Underlying largely normal for age gray and white matter signal throughout the brain. No cortical encephalomalacia, although there is a chronic microhemorrhage in the left parietal lobe on series 7, image 21. No other chronic blood products identified. Vascular: Major intracranial vascular flow  voids are preserved. The major dural venous sinuses are enhancing and appear to be patent. Skull and upper cervical spine: Negative visible cervical spine. Negative visible spinal cord. Visualized bone marrow signal is within normal limits. Sinuses/Orbits: Postoperative changes to both globes, otherwise negative orbits. Trace paranasal sinus mucosal thickening. Other: Mastoids are clear. Visible internal auditory structures appear normal. Scalp and face soft tissues appear within normal limits. IMPRESSION: 1. Positive for solitary right parietal lobe brain metastasis, 6-7 mm and located at the sensory strip. Faint hemosiderin within the lesion, but no associated edema or mass effect. 2. Negative for age MRI appearance of the brain otherwise; tiny nonspecific chronic microhemorrhage in the left parietal lobe. Electronically Signed   By: Genevie Ann M.D.   On: 03/27/2019 11:59   Nm Pet Image Initial (pi) Skull Base To Thigh  Result Date: 03/27/2019 CLINICAL DATA:  Initial treatment strategy for lung cancer. Malignant pleural effusion. EXAM: NUCLEAR MEDICINE PET SKULL BASE TO THIGH TECHNIQUE: 9.57 mCi F-18 FDG was injected intravenously. Full-ring PET imaging was performed from the skull base to thigh after the radiotracer. CT data was obtained and used for attenuation correction and anatomic localization. Fasting blood glucose: 98 mg/dl COMPARISON:  Chest CT 02/09/2019 FINDINGS: Mediastinal blood pool activity: SUV max 2.58 Liver activity: SUV max NA NECK: No hypermetabolic lymph nodes in the neck. Incidental CT findings: none CHEST: Area of marked hypermetabolism and a somewhat rounded masslike lesion in the medial aspect of the right upper lobe possibly reflecting the patient's primary lung neoplasm. SUV max is 22.12. This lesion measures approximately 2.5 cm on image number 78. Second area of fairly significant hypermetabolism noted in the right lower lobe and possibly partly in the right middle lobe. This has an  SUV max of 15.18. Extensive hypermetabolic pleural metastasis involving the right hemithorax. Pleural lesion anteriorly on image number 80 measures 10 mm and SUV max is 17.2. There are also numerous small pleural nodules along the major and minor fissures. No obvious pulmonary nodules. Extensive mediastinal and hilar lymphadenopathy. 18 mm pretracheal nodal lesion on image number 81 has an SUV max of 18.74. Subcarinal lesion measures 11 mm on image number 89 and has an SUV max of 18.68. Incidental CT findings: Persistent large right pleural effusion. ABDOMEN/PELVIS: There is a vague low-attenuation lesion in the liver measuring 2 cm. This is in segment 7 and is markedly hypermetabolic. The SUV max is 19.14. I am not sure if this is an intraparenchymal metastasis or a peritoneal surface metastasis. No other liver lesions are identified. Right-sided retroperitoneal lymph node just posterior to the IVC on image number 379 is hypermetabolic with SUV max of 02.40. Small lymph node anterior to the IVC on image number 156 measures 5 mm and SUV max is 3.43. No adrenal gland lesions. Incidental CT findings: Small left-sided bladder diverticulum. Enlarged prostate gland with median lobe hypertrophy impressing on the base of the bladder. SKELETON: No findings to suggest osseous metastatic disease. Incidental CT findings: none IMPRESSION: 1. Probable right upper lobe and right lower lobe/middle lobe  lung cancer with extensive metastatic pleural disease and mediastinal and hilar disease. There is also a large malignant right pleural effusion. 2. Evidence of disease below the diaphragm with a right hepatic lobe lesion and metastatic adenopathy in the abdomen. 3. No findings suspicious for osseous metastatic disease or pulmonary metastatic disease. Electronically Signed   By: Marijo Sanes M.D.   On: 03/27/2019 10:09   Ir Thoracentesis Asp Pleural Space W/img Guide  Result Date: 03/13/2019 INDICATION: Patient with history of  lung adenocarcinoma, pleural effusion. Request is made for therapeutic right thoracentesis. EXAM: ULTRASOUND GUIDED THERAPEUTIC RIGHT THORACENTESIS MEDICATIONS: 10 mL 1% lidocaine COMPLICATIONS: None immediate. PROCEDURE: An ultrasound guided thoracentesis was thoroughly discussed with the patient and questions answered. The benefits, risks, alternatives and complications were also discussed. The patient understands and wishes to proceed with the procedure. Written consent was obtained. Ultrasound was performed to localize and mark an adequate pocket of fluid in the right chest. The area was then prepped and draped in the normal sterile fashion. 1% Lidocaine was used for local anesthesia. Under ultrasound guidance a 6 Fr Safe-T-Centesis catheter was introduced. Thoracentesis was performed. The catheter was removed and a dressing applied. FINDINGS: A total of approximately 1.9 liters of amber fluid was removed. Samples were sent to the laboratory as requested by the clinical team. IMPRESSION: Successful ultrasound guided therapeutic right thoracentesis yielding 1.9 L of pleural fluid. Read by: Brynda Greathouse PA-C Electronically Signed   By: Aletta Edouard M.D.   On: 03/13/2019 11:10    ASSESSMENT AND PLAN: This is a very pleasant 68 years old white male recently diagnosed with a stage IV (T2b, and 2, M1C) non-small cell lung cancer, adenocarcinoma with positive EGFR mutation in exon 20 (resistant mutation) diagnosed in July 2020 and presented with extensive disease involving the right upper lobe as well as the right middle and lower lobe with extensive pleural based metastasis as well as mediastinal and hilar disease with malignant pleural effusion as well as liver and brain metastasis. Unfortunately the patient has no actionable mutations based on the molecular studies by [employer]ant [employer].  He is scheduled to have Pleurx catheter placement tomorrow and we will send the tissue block for molecular studies by  foundation 1 as well as PDL 1 expression. I discussed the scan results including the PET scan and MRI brain with the patient today. I will refer the patient to radiation oncology for palliative radiotherapy to the solitary brain metastasis. I discussed with the patient his treatment options and he understands that he has incurable condition and all the treatment will be of palliative nature. I gave the patient the option of palliative care versus palliative systemic chemotherapy with carboplatin for AUC of 5, Alimta 500 mg/M2 and Keytruda 200 mg IV every 3 weeks. The patient is interested in treatment.  I discussed with him the adverse effect of this treatment including but not limited to alopecia, myelosuppression, nausea and vomiting, peripheral neuropathy, liver or renal dysfunction as well as immunotherapy adverse effects. He is expected to start the first cycle of this treatment next week. He is on monthly vitamin B12 injection by his primary care physician.  He will not receive the vitamin B12 injection in the clinic. I will call his pharmacy with prescription for folic acid 1 mg p.o. daily in addition to Compazine 10 mg p.o. every 6 hours as needed for nausea and EMLA cream to be applied to the Port-A-Cath site before treatment. I will discuss with Dr. Roxan Hockey the  possibility of placing a Port-A-Cath for the patient during the procedure tomorrow but if not done tomorrow may proceed with IV chemo through peripheral IV for the first cycle. I will see the patient back for follow-up visit in 2 weeks for reevaluation and management of any adverse effect of his treatment. He was advised to call immediately if he has any other concerning symptoms in the interval. The patient voices understanding of current disease status and treatment options and is in agreement with the current care plan.  All questions were answered. The patient knows to call the clinic with any problems, questions or concerns.  We can certainly see the patient much sooner if necessary.  I spent 20 minutes counseling the patient face to face. The total time spent in the appointment was 30 minutes.  Disclaimer: This note was dictated with voice recognition software. Similar sounding words can inadvertently be transcribed and may not be corrected upon review.

## 2019-03-28 NOTE — Progress Notes (Signed)
See progress note under physician encounter. 

## 2019-03-28 NOTE — Progress Notes (Signed)
Oncology Nurse Navigator Documentation  Oncology Nurse Navigator Flowsheets 03/28/2019  Diagnosis Status Confirmed Diagnosis Complete  Planned Course of Treatment Chemotherapy  Navigator Follow Up Date: 04/04/2019  Navigator Follow Up Reason: Review Note  Navigator Location [facility]  Referral Date to RadOnc/MedOnc -  Navigator Encounter Type Follow-up Appt/Patient had follow up with Dr. Julien Nordmann today.  His work up for lung cancer is completed and treatment plan was discussed with patient.  I gave and explained information on diagnosis, treatment, and next steps.   Per Dr. Julien Nordmann, I contacted Dr. Leonarda Salon office to update them on need for port placement.   I contacted financial advocate, Stefanie Libel regarding patient's concerns on how to pay for treatment.    Telephone -  Abnormal Finding Date -  Confirmed Diagnosis Date -  Patient Visit Type MedOnc  Treatment Phase Pre-Tx/Tx Discussion  Barriers/Navigation Needs Coordination of Care;Education  Education Newly Diagnosed Cancer Education;Other  Interventions Coordination of Care;Education  Coordination of Care Other  Education Method Verbal;Written  Support Groups/Services Other  Acuity Level 3  Time Spent with Patient 60

## 2019-03-28 NOTE — Progress Notes (Signed)
Radiation Oncology         [phone]) [phone] ________________________________  Initial outpatient Consultation  Name: Ethan Brown MRN: [id number]  Date of Service: 03/28/2019 DOB: [birth year]  CC:Fry, Ishmael Holter, MD  Curt Bears, MD   REFERRING PHYSICIAN: Curt Bears, MD  DIAGNOSIS: 68 y/o male with newly diagnosed Stage IV (T2b, N2, M1c) NSCLC, adenocarcinoma of the right lung with a solitary brain metastasis.     ICD-10-CM   1. Secondary malignant neoplasm of brain and spinal cord (HCC)  C79.31    C79.49   2. Primary malignant neoplasm of lung with metastasis to brain [facility])  C34.90    C79.31     HISTORY OF PRESENT ILLNESS: Ethan Brown is a 68 y.o. male seen at the request of Dr. Julien Nordmann.  He reports having a chronic, intermittent dry cough on and off for many years but this had become more persistent since January 2020.  He recently relocated to [facility] from [place] approximately 1-1/2 years ago.  More recently, his coughing episodes were getting worse and not resolving as they had previously prompting him to present to his PCP for further evaluation.  He had a chest x-ray on 01/18/2019 which showed a small right pleural effusion.  This was further evaluated with CT chest on 02/09/2019 which showed a 3.4 x 2.4 cm mass in the medial right middle lobe, extending into the right upper lobe with associated moderate size right pleural effusion, compatible with a primary lung carcinoma.  There were also multiple pleural-based nodules in the right lung as well as a confluent soft tissue thickening in the medial aspect of the right lower lobe and multiple enlarged mediastinal and right hilar lymph nodes, suspicious for metastatic disease.  Additionally noted was an indeterminate  1.2 cm right lobe liver mass.  He underwent ultrasound-guided right thoracentesis on 02/22/2019 for both therapeutic and diagnostic purposes.  The final pathology revealed malignant cells  with features favoring adenocarcinoma.  Due to progressive shortness of breath, he required a repeat ultrasound-guided thoracentesis on 03/13/2019 with drainage of approximately 2 L of pleural fluid.  A follow-up chest x-ray on 03/21/2019 revealed rapid reaccumulation of the right pleural effusion, marginally larger than on prior study from 03/13/2019.  He is scheduled for Pleurx catheter placement with Dr. Gorden Harms on 03/29/2019.  He underwent PET imaging and brain MRI on 03/27/2019 to complete his disease staging.  The PET scan confirms hypermetabolic lesions in the right upper lobe and right lower lobe/middle lobe lung with extensive metastatic pleural disease and mediastinal and hilar disease as well as a large malignant right pleural effusion.  There was also evidence of disease below the diaphragm with a hypermetabolic right hepatic lobe lesion and metastatic adenopathy in the abdomen but no suspicious findings for osseous metastatic disease.  MRI of the brain revealed a solitary, 6-7  mm right parietal lobe brain metastasis located at the sensory strip with faint hemosiderin within the lesion but no associated edema or mass-effect.  He denies any related neurologic symptoms such as headache, decrease in visual or auditory acuity, tinnitus, imbalance, dizziness, difficulty with speech, focal weakness or seizure activity.  PREVIOUS RADIATION THERAPY: No  PAST MEDICAL HISTORY:  Past Medical History:  Diagnosis Date  . Borderline hypertension   . Borderline systolic hypertension   . Detached vitreous humor, left   . Dyspnea   . ED (erectile dysfunction) of organic origin   . Hyperlipidemia   . Lens subluxation, left   .  Lung cancer ([place])   . Migraine   . Migraine headache    takes PRN Imitrex  . Pseudophakia   . TGA (transient global amnesia) 2017  . Transient global amnesia       PAST SURGICAL HISTORY: Past Surgical History:  Procedure Laterality Date  . CAROTID DOPPLERS  09/2017   Mild  non-obstructive disease  . CATARACT EXTRACTION, BILATERAL    . cataract, left  2018  . COLONOSCOPY  2016   clear, repeat in 10 yrs   . Eye surgeries     , Lens attachment.  Vitrectomy  . FEMORAL HERNIA REPAIR    . HERNIA REPAIR    . IR THORACENTESIS ASP PLEURAL SPACE W/IMG GUIDE  02/22/2019  . IR THORACENTESIS ASP PLEURAL SPACE W/IMG GUIDE  03/13/2019  . LUMBAR LAMINECTOMY    . retinal attachment, right    . sclearl buckle, right    . victrectomy,right      FAMILY HISTORY:  Family History  Problem Relation Age of Onset  . Alcohol abuse Mother   . Diabetes Father   . Hypertension Father   . Prostate cancer Father   . Cancer Father   . Heart attack Maternal Grandfather 78    SOCIAL HISTORY:  Social History   Socioeconomic History  . Marital status: Married    Spouse name: [name]  . Number of children: 3  . Years of education: Not on file  . Highest education level: Master's degree (e.g., MA, MS, MEng, MEd, MSW, MBA)  Occupational History  . Occupation: retired Mining engineer  . Financial resource strain: Not on file  . Food insecurity    Worry: Not on file    Inability: Not on file  . Transportation needs    Medical: Not on file    Non-medical: Not on file  Tobacco Use  . Smoking status: Never Smoker  . Smokeless tobacco: Never Used  Substance and Sexual Activity  . Alcohol use: Yes    Alcohol/week: 1.0 standard drinks    Types: 1 Glasses of wine per week  . Drug use: No  . Sexual activity: Not Currently  Lifestyle  . Physical activity    Days per week: Not on file    Minutes per session: Not on file  . Stress: Not on file  Relationships  . Social Herbalist on phone: Not on file    Gets together: Not on file    Attends religious service: Not on file    Active member of club or organization: Not on file    Attends meetings of clubs or organizations: Not on file    Relationship status: Not on file  . Intimate partner violence     Fear of current or ex partner: Not on file    Emotionally abused: Not on file    Physically abused: Not on file    Forced sexual activity: Not on file  Other Topics Concern  . Not on file  Social History Narrative   Married father of 84, grandfather 50.  Lives with his wife, [name] they recently moved to [place] after living in [place].      He is a retired Engineer, maintenance (IT)   Was previously on an exercise regimen at [employer] 3 days a week for 90 minutes at a time --> he is now hoping to get back into that plan, but is currently now riding his bike at least 30 to 40 minutes a day for  5 days a week and then doing longer walks about 6 days a week.    ALLERGIES: Patient has no known allergies.  MEDICATIONS:  No current facility-administered medications for this encounter.    No current outpatient medications on file.   Facility-Administered Medications Ordered in Other Encounters  Medication Dose Route Frequency Provider Last Rate Last Dose  . ceFAZolin (ANCEF) 2-4 GM/100ML-% IVPB           . ceFAZolin (ANCEF) IVPB 2g/100 mL premix  2 g Intravenous 30 min Pre-Op [name], MD      . lactated ringers infusion   Intravenous Continuous Belinda Block, MD 10 mL/hr at 03/29/19 1124      REVIEW OF SYSTEMS:  On review of systems, the patient reports that he is doing well overall.  He denies any chest pain, increased shortness of breath, hemoptysis, fevers, chills, night sweats, or unintended weight changes.  He continues with a persistent, hacking, dry cough.  He denies any bowel or bladder disturbances, and denies abdominal pain, nausea or vomiting.  He denies any new musculoskeletal or joint aches or pains. A complete review of systems is obtained and is otherwise negative.  PHYSICAL EXAM:  Wt Readings from Last 3 Encounters:  03/29/19 196 lb (88.9 kg)  03/28/19 196 lb (88.9 kg)  03/28/19 196 lb 11.2 oz (89.2 kg)   Temp Readings from Last 3 Encounters:  03/29/19 98.5 F  (36.9 C) (Oral)  03/28/19 98.6 F (37 C)  03/28/19 98.3 F (36.8 C) (Oral)   BP Readings from Last 3 Encounters:  03/29/19 132/70  03/28/19 125/85  03/28/19 128/89   Pulse Readings from Last 3 Encounters:  03/29/19 80  03/28/19 88  03/28/19 87   Pain Assessment Pain Score: 0-No pain/10  In general this is a well appearing Caucasian male in no acute distress.  He's alert and oriented x4 and appropriate throughout the examination. Cardiopulmonary assessment is negative for acute distress and he exhibits normal effort.   KPS = 90  100 - Normal; no complaints; no evidence of disease. 90   - Able to carry on normal activity; minor signs or symptoms of disease. 80   - Normal activity with effort; some signs or symptoms of disease. 40   - Cares for self; unable to carry on normal activity or to do active work. 60   - Requires occasional assistance, but is able to care for most of his personal needs. 50   - Requires considerable assistance and frequent medical care. 63   - Disabled; requires special care and assistance. 72   - Severely disabled; hospital admission is indicated although death not imminent. 74   - Very sick; hospital admission necessary; active supportive treatment necessary. 10   - Moribund; fatal processes progressing rapidly. 0     - Dead  Karnofsky DA, Abelmann Geraldine, Craver LS and Burchenal [facility] [phone]) The use of the nitrogen mustards in the palliative treatment of carcinoma: with particular reference to bronchogenic carcinoma Cancer 1 634-56  LABORATORY DATA:  Lab Results  Component Value Date   WBC 7.5 03/28/2019   HGB 16.3 03/28/2019   HCT 48.3 03/28/2019   MCV 93.8 03/28/2019   PLT 264 03/28/2019   Lab Results  Component Value Date   NA 139 03/28/2019   K 4.5 03/28/2019   CL 110 03/28/2019   CO2 18 (L) 03/28/2019   Lab Results  Component Value Date   ALT 21 03/28/2019   AST 25 03/28/2019  ALKPHOS 55 03/28/2019   BILITOT 0.5 03/28/2019      RADIOGRAPHY: Dg Chest 1 View  Result Date: 03/13/2019 CLINICAL DATA:  Post right thoracentesis EXAM: CHEST  1 VIEW COMPARISON:  02/22/2019 FINDINGS: Decreasing right effusion and right base atelectasis. No pneumothorax following thoracentesis. Left lung clear. Heart is normal size. No acute bony abnormality. IMPRESSION: Small right effusion and right base atelectasis, both improving since prior study. No pneumothorax following thoracentesis. Electronically Signed   By: Rolm Baptise M.D.   On: 03/13/2019 11:06   Dg Chest 2 View  Result Date: 03/28/2019 CLINICAL DATA:  Pre-admission for right VATS and pleural biopsy EXAM: CHEST - 2 VIEW COMPARISON:  None. FINDINGS: Masslike widening of the right paratracheal stripe and nodular opacities in the right hilum and right lung compatible with masses seen on prior CT. Persistent right pleural effusion with fluid tracking in the minor fissure. Left lung is largely clear. Remaining aerated portions of the right lung are clear. No acute osseous or soft tissue abnormality. Right heart border is largely obscured by the right effusion. The left heart border is unremarkable. IMPRESSION: 1. Redemonstrated nodular masses in the right lung, mediastinum and hilum 2. Persistent right pleural effusion with fluid tracking in the minor fissure. Electronically Signed   By: Lovena Le M.D.   On: 03/28/2019 15:52   Dg Chest 2 View  Result Date: 03/21/2019 CLINICAL DATA:  Shortness of breath and pleural effusion. History of lung carcinoma EXAM: CHEST - 2 VIEW COMPARISON:  March 13, 2019 FINDINGS: There is persistent pleural effusion on the right which may be slightly increased compared to recent study. There is patchy atelectasis in the right middle and lower lobe regions. The lungs elsewhere are clear. Heart size and pulmonary vascularity are normal. No adenopathy. No bone lesions. No pneumothorax. IMPRESSION: Persistent right pleural effusion, marginally larger than on recent  study. Atelectatic change in the right middle and lower lobes inferiorly. Lungs elsewhere clear. Stable cardiac silhouette. Electronically Signed   By: Lowella Grip III M.D.   On: 03/21/2019 15:47   Mr Jeri Cos KP Contrast  Result Date: 03/27/2019 CLINICAL DATA:  68 year old male initial staging, lung cancer with malignant pleural effusion. EXAM: MRI HEAD WITHOUT AND WITH CONTRAST TECHNIQUE: Multiplanar, multiecho pulse sequences of the brain and surrounding structures were obtained without and with intravenous contrast. CONTRAST:  7.5 milliliters Gadavist COMPARISON:  PET-CT today reported separately. FINDINGS: Brain: There is a round 6-7 millimeter enhancing lesion at the right sensory strip on series 11, image 77 and series 13, image 6. No associated edema or mass effect. There is faint hemosiderin within the lesion on series 7, image 24. No other abnormal enhancement identified.  No dural thickening. No restricted diffusion to suggest acute infarction. No midline shift, mass effect, ventriculomegaly, extra-axial collection or acute intracranial hemorrhage. Cervicomedullary junction and pituitary are within normal limits. Underlying largely normal for age gray and white matter signal throughout the brain. No cortical encephalomalacia, although there is a chronic microhemorrhage in the left parietal lobe on series 7, image 21. No other chronic blood products identified. Vascular: Major intracranial vascular flow voids are preserved. The major dural venous sinuses are enhancing and appear to be patent. Skull and upper cervical spine: Negative visible cervical spine. Negative visible spinal cord. Visualized bone marrow signal is within normal limits. Sinuses/Orbits: Postoperative changes to both globes, otherwise negative orbits. Trace paranasal sinus mucosal thickening. Other: Mastoids are clear. Visible internal auditory structures appear normal. Scalp and face soft  tissues appear within normal limits.  IMPRESSION: 1. Positive for solitary right parietal lobe brain metastasis, 6-7 mm and located at the sensory strip. Faint hemosiderin within the lesion, but no associated edema or mass effect. 2. Negative for age MRI appearance of the brain otherwise; tiny nonspecific chronic microhemorrhage in the left parietal lobe. Electronically Signed   By: Genevie Ann M.D.   On: 03/27/2019 11:59   Nm Pet Image Initial (pi) Skull Base To Thigh  Result Date: 03/27/2019 CLINICAL DATA:  Initial treatment strategy for lung cancer. Malignant pleural effusion. EXAM: NUCLEAR MEDICINE PET SKULL BASE TO THIGH TECHNIQUE: 9.57 mCi F-18 FDG was injected intravenously. Full-ring PET imaging was performed from the skull base to thigh after the radiotracer. CT data was obtained and used for attenuation correction and anatomic localization. Fasting blood glucose: 98 mg/dl COMPARISON:  Chest CT 02/09/2019 FINDINGS: Mediastinal blood pool activity: SUV max 2.58 Liver activity: SUV max NA NECK: No hypermetabolic lymph nodes in the neck. Incidental CT findings: none CHEST: Area of marked hypermetabolism and a somewhat rounded masslike lesion in the medial aspect of the right upper lobe possibly reflecting the patient's primary lung neoplasm. SUV max is 22.12. This lesion measures approximately 2.5 cm on image number 78. Second area of fairly significant hypermetabolism noted in the right lower lobe and possibly partly in the right middle lobe. This has an SUV max of 15.18. Extensive hypermetabolic pleural metastasis involving the right hemithorax. Pleural lesion anteriorly on image number 80 measures 10 mm and SUV max is 17.2. There are also numerous small pleural nodules along the major and minor fissures. No obvious pulmonary nodules. Extensive mediastinal and hilar lymphadenopathy. 18 mm pretracheal nodal lesion on image number 81 has an SUV max of 18.74. Subcarinal lesion measures 11 mm on image number 89 and has an SUV max of 18.68. Incidental  CT findings: Persistent large right pleural effusion. ABDOMEN/PELVIS: There is a vague low-attenuation lesion in the liver measuring 2 cm. This is in segment 7 and is markedly hypermetabolic. The SUV max is 19.14. I am not sure if this is an intraparenchymal metastasis or a peritoneal surface metastasis. No other liver lesions are identified. Right-sided retroperitoneal lymph node just posterior to the IVC on image number 130 is hypermetabolic with SUV max of 86.57. Small lymph node anterior to the IVC on image number 156 measures 5 mm and SUV max is 3.43. No adrenal gland lesions. Incidental CT findings: Small left-sided bladder diverticulum. Enlarged prostate gland with median lobe hypertrophy impressing on the base of the bladder. SKELETON: No findings to suggest osseous metastatic disease. Incidental CT findings: none IMPRESSION: 1. Probable right upper lobe and right lower lobe/middle lobe lung cancer with extensive metastatic pleural disease and mediastinal and hilar disease. There is also a large malignant right pleural effusion. 2. Evidence of disease below the diaphragm with a right hepatic lobe lesion and metastatic adenopathy in the abdomen. 3. No findings suspicious for osseous metastatic disease or pulmonary metastatic disease. Electronically Signed   By: Marijo Sanes M.D.   On: 03/27/2019 10:09   Ir Thoracentesis Asp Pleural Space W/img Guide  Result Date: 03/13/2019 INDICATION: Patient with history of lung adenocarcinoma, pleural effusion. Request is made for therapeutic right thoracentesis. EXAM: ULTRASOUND GUIDED THERAPEUTIC RIGHT THORACENTESIS MEDICATIONS: 10 mL 1% lidocaine COMPLICATIONS: None immediate. PROCEDURE: An ultrasound guided thoracentesis was thoroughly discussed with the patient and questions answered. The benefits, risks, alternatives and complications were also discussed. The patient understands and wishes to proceed  with the procedure. Written consent was obtained. Ultrasound  was performed to localize and mark an adequate pocket of fluid in the right chest. The area was then prepped and draped in the normal sterile fashion. 1% Lidocaine was used for local anesthesia. Under ultrasound guidance a 6 Fr Safe-T-Centesis catheter was introduced. Thoracentesis was performed. The catheter was removed and a dressing applied. FINDINGS: A total of approximately 1.9 liters of amber fluid was removed. Samples were sent to the laboratory as requested by the clinical team. IMPRESSION: Successful ultrasound guided therapeutic right thoracentesis yielding 1.9 L of pleural fluid. Read by: Brynda Greathouse PA-C Electronically Signed   By: Aletta Edouard M.D.   On: 03/13/2019 11:10      IMPRESSION: 1. 68 y.o. with newly diagnosed Stage IV (T2b, N2, M1c) NSCLC, adenocarcinoma of the right lung with a solitary brain metastasis. Today, we talked to the patient about the findings and workup thus far. We discussed the natural history of metastatic lung cancer and general treatment, highlighting the role of radiotherapy in the management of metastatic disease to the brain. The options include whole brain irradiation versus stereotactic radiosurgery. There are pros and cons associated with each of these potential treatment options. Whole brain radiotherapy would treat the known metastatic deposits and help provide some reduction of risk for future brain metastases. However, whole brain radiotherapy carries potential risks including hair loss, subacute somnolence, and neurocognitive changes including a possible reduction in short-term memory. Whole brain radiotherapy also may carry a lower likelihood of tumor control at the treatment sites because of the low-dose used. Stereotactic radiosurgery carries a higher likelihood for local tumor control at the targeted sites with lower associated risk for neurocognitive changes such as memory loss. However, the use of stereotactic radiosurgery in this setting may leave  the patient at increased risk for new brain metastases elsewhere in the brain as high as 50-60%. Accordingly, patients who receive stereotactic radiosurgery in this setting should undergo ongoing surveillance imaging with brain MRI more frequently in order to identify and treat new small brain metastases before they become symptomatic. Stereotactic radiosurgery does carry some different risks, including a risk of radionecrosis. The patient was encouraged to ask questions that were answered to his stated satisfaction.  PLAN: We discussed the dilemma regarding whole brain radiotherapy versus stereotactic radiosurgery. We discussed the pros and cons of each. We also discussed the logistics and delivery of each. We reviewed the results associated with each of the treatments described above. The patient seems to understand the treatment options and would like to proceed with stereotactic radiosurgery.  He has given verbal consent to proceed and will sign formal written consent at the time of his CT Simulation which is scheduled for Monday 04/03/19 at 8am.  He is scheduled for a 3T SRS protocol MRI brain for treatment planning on 03/31/19 and his treatment will be coordinated with neurosurgery, under the care and direction of Dr. Kary Kos whom he will meet in consultation on 04/06/19.  He is tentatively scheduled for a single fraction SRS  treatment on 04/07/19.  He is tentatively scheduled to start palliative systemic chemotherapy with carboplatin/Alimta/Keytruda on 04/04/2019 under the care and direction of Dr. Julien Nordmann.  He appears to have a good understanding of his disease and our treatment recommendations and is comfortable with and in agreement with the stated plan.  We spent 60 minutes minutes face to face with the patient and more than 50% of that time was spent in counseling and/or coordination  of care.     Nicholos Johns, PA-C    Tyler Pita, MD  [facility] Direct Dial:  [phone]  Fax: [phone] [email or web address]  Skype  [email or web address]

## 2019-03-28 NOTE — Progress Notes (Signed)
Location/Histology of Brain Tumor: Stage IV (T2b, N2, M1c)presented with right middle lobe lung mass in addition to mediastinal lymphadenopathy as well as malignant right pleural effusion with pleural-based nodules and suspicious right hepatic lesion and the brain metastasis diagnosed in July 2020.  Patient presented with symptoms of:  Patient in usual state of health until this past winter when he developed a cough. In March he had an episode of pleuritic pain. Cough persisted and he sought medical attention in June. Chest xray lead to CT. Work up to in PET and brain MRI revealed solitary right parietal brain met.   Past or anticipated interventions, if any, per neurosurgery: no  Past or anticipated interventions, if any, per medical oncology: Systemic chemotherapy with carboplatin for AUC of 5, Alimta 500 mg/M2 and Keytruda 200 mg IV every 3 weeks.  First dose April 04, 2019.   Scheduled for pleurx catheter and porta cath placement tomorrow with Dr. Roxan Hockey.   Dose of Decadron, if applicable: no  Recent neurologic symptoms, if any:   Seizures: no  Headaches: Prone to headaches in cycles. Denies any change in intensity or nature.   Nausea: no  Dizziness/ataxia: no  Difficulty with hand coordination: no  Focal numbness/weakness: no  Visual deficits/changes: no  Confusion/Memory deficits: no  Painful bone metastases at present, if any: no  SAFETY ISSUES:  Prior radiation? no  Pacemaker/ICD? no  Possible current pregnancy? no, male patient  Is the patient on methotrexate? no  Additional Complaints / other details: 68 year old male. 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   Reports SOB with exertion. Reports a mild cough. Denies chest pain or hemoptysis. Denies weight loss or night sweats.   Next B12 injection due August 13th. Chemotherapy education scheduled:04/03/2019.

## 2019-03-28 NOTE — Progress Notes (Signed)
Dr. Leonarda Salon office updated me that they will get the port scheduled for Mr. Mobley.

## 2019-03-28 NOTE — Anesthesia Preprocedure Evaluation (Addendum)
Anesthesia Evaluation  Patient identified by MRN, date of birth, ID band Patient awake    Reviewed: Allergy & Precautions, NPO status , Patient's Chart, lab work & pertinent test results  Airway Mallampati: II  TM Distance: >3 FB     Dental   Pulmonary    breath sounds clear to auscultation       Cardiovascular hypertension,  Rhythm:Regular Rate:Normal     Neuro/Psych    GI/Hepatic negative GI ROS, Neg liver ROS,   Endo/Other    Renal/GU negative Renal ROS     Musculoskeletal   Abdominal   Peds  Hematology negative hematology ROS (+)   Anesthesia Other Findings   Reproductive/Obstetrics                            Anesthesia Physical Anesthesia Plan  ASA: III  Anesthesia Plan: General   Post-op Pain Management:    Induction: Intravenous  PONV Risk Score and Plan: Ondansetron, Dexamethasone and Midazolam  Airway Management Planned: Oral ETT  Additional Equipment:   Intra-op Plan:   Post-operative Plan: Possible Post-op intubation/ventilation  Informed Consent: I have reviewed the patients History and Physical, chart, labs and discussed the procedure including the risks, benefits and alternatives for the proposed anesthesia with the patient or authorized representative who has indicated his/her understanding and acceptance.     Dental advisory given  Plan Discussed with: CRNA and Anesthesiologist  Anesthesia Plan Comments: (Preop cardiovascular exam by Dr. Ellyn Hack 02/20/19 "Mr. Innis is able to vigorously exercise both on the bike and with walking with no active heart failure or angina type symptoms.  He has normal renal function,, and is nondiabetic.  There is no history of a stroke. Based on the [place], HE WILL BE CONSIDERED TO BE A LOW RISK PATIENT FOR LOW RISK PROCEDURE. I am checking a 2D echocardiogram simply because of concern for possible occult  pericardial effusion and the borderline EKG.  However I would not recommend any further cardiac evaluation unless the echocardiogram is grossly abnormal."  TTE 03/01/19:  1. The left ventricle has normal systolic function, with an ejection fraction of 55-60%. The cavity size was normal. Left ventricular diastolic Doppler parameters are consistent with impaired relaxation. No evidence of left ventricular regional wall  motion abnormalities.  2. The right ventricle has normal systolic function. The cavity was normal. There is no increase in right ventricular wall thickness. Right ventricular systolic pressure could not be assessed.     )       Anesthesia Quick Evaluation

## 2019-03-28 NOTE — Telephone Encounter (Signed)
Gave avs and calendar ° °

## 2019-03-28 NOTE — Progress Notes (Signed)
Rescheduled MRI to be done at GI because they could get him in sooner.

## 2019-03-28 NOTE — Progress Notes (Signed)
START ON PATHWAY REGIMEN - Non-Small Cell Lung     A cycle is every 21 days:     Pembrolizumab      Pemetrexed      Carboplatin   **Always confirm dose/schedule in your pharmacy ordering system**  Patient Characteristics: Stage IV Metastatic, Nonsquamous, Initial Chemotherapy/Immunotherapy, PS = 0, 1, ALK Rearrangement Negative and EGFR Mutation Negative/Non-Sensitizing, PD-L1 Expression Positive 1-49% (TPS) / Negative / Not Tested / Awaiting Test Results and  Immunotherapy Candidate AJCC T Category: T2b Current Disease Status: Distant Metastases AJCC N Category: N2 AJCC M Category: M1c AJCC 8 Stage Grouping: IVB Histology: Nonsquamous Cell ROS1 Rearrangement Status: Negative T790M Mutation Status: Not Applicable - EGFR Mutation Negative/Unknown Other Mutations/Biomarkers: No Other Actionable Mutations NTRK Gene Fusion Status: Negative PD-L1 Expression Status: Quantity Not Sufficient Chemotherapy/Immunotherapy LOT: Initial Chemotherapy/Immunotherapy Molecular Targeted Therapy: Not Appropriate ALK Rearrangement Status: Negative EGFR Mutation Status: Negative/Wild Type BRAF V600E Mutation Status: Negative ECOG Performance Status: 1 Immunotherapy Candidate Status: Candidate for Immunotherapy Intent of Therapy: Non-Curative / Palliative Intent, Discussed with Patient 

## 2019-03-28 NOTE — Progress Notes (Addendum)
PCP - Alysia Penna Cardiologist - [place]  Chest x-ray - 03-28-19 EKG - 03-28-19  ECHO - 03-01-19   Anesthesia review: yes, EKG  Patient denies fever, and chest pain at PAT appointment Pt has SOB & cough - not a new finding, due to diagnosis of lung cancer   Patient verbalized understanding of instructions that were given to them at the PAT appointment. Patient was also instructed that they will need to review over the PAT instructions again at home before surgery.

## 2019-03-29 ENCOUNTER — Inpatient Hospital Stay (HOSPITAL_COMMUNITY): Payer: Medicare Other

## 2019-03-29 ENCOUNTER — Inpatient Hospital Stay (HOSPITAL_COMMUNITY)
Admission: RE | Admit: 2019-03-29 | Discharge: 2019-03-30 | DRG: 167 | Disposition: A | Discharge status: Home Health | Payer: Medicare Other | Attending: Thoracic Surgery (Cardiothoracic Vascular Surgery) | Admitting: Thoracic Surgery (Cardiothoracic Vascular Surgery)

## 2019-03-29 ENCOUNTER — Encounter (HOSPITAL_COMMUNITY): Payer: Self-pay

## 2019-03-29 ENCOUNTER — Other Ambulatory Visit: Payer: Self-pay

## 2019-03-29 ENCOUNTER — Inpatient Hospital Stay (HOSPITAL_COMMUNITY): Payer: Medicare Other | Admitting: Physician Assistant

## 2019-03-29 ENCOUNTER — Encounter (HOSPITAL_COMMUNITY)
Admission: RE | Disposition: A | Discharge status: Home / Self Care | Payer: Self-pay | Source: Home / Self Care | Attending: Thoracic Surgery (Cardiothoracic Vascular Surgery)

## 2019-03-29 DIAGNOSIS — I10 Essential (primary) hypertension: Secondary | ICD-10-CM | POA: Diagnosis not present

## 2019-03-29 DIAGNOSIS — J939 Pneumothorax, unspecified: Secondary | ICD-10-CM

## 2019-03-29 DIAGNOSIS — J910 Malignant pleural effusion: Secondary | ICD-10-CM | POA: Diagnosis present

## 2019-03-29 DIAGNOSIS — G43909 Migraine, unspecified, not intractable, without status migrainosus: Secondary | ICD-10-CM | POA: Diagnosis present

## 2019-03-29 DIAGNOSIS — N528 Other male erectile dysfunction: Secondary | ICD-10-CM | POA: Diagnosis present

## 2019-03-29 DIAGNOSIS — C3491 Malignant neoplasm of unspecified part of right bronchus or lung: Secondary | ICD-10-CM | POA: Diagnosis present

## 2019-03-29 DIAGNOSIS — Z720 Tobacco use: Secondary | ICD-10-CM

## 2019-03-29 DIAGNOSIS — Z9911 Dependence on respirator [ventilator] status: Secondary | ICD-10-CM | POA: Diagnosis not present

## 2019-03-29 DIAGNOSIS — J189 Pneumonia, unspecified organism: Secondary | ICD-10-CM | POA: Diagnosis not present

## 2019-03-29 DIAGNOSIS — J90 Pleural effusion, not elsewhere classified: Secondary | ICD-10-CM | POA: Diagnosis not present

## 2019-03-29 DIAGNOSIS — Z8042 Family history of malignant neoplasm of prostate: Secondary | ICD-10-CM | POA: Diagnosis not present

## 2019-03-29 DIAGNOSIS — C799 Secondary malignant neoplasm of unspecified site: Secondary | ICD-10-CM | POA: Diagnosis present

## 2019-03-29 DIAGNOSIS — Z20828 Contact with and (suspected) exposure to other viral communicable diseases: Secondary | ICD-10-CM | POA: Diagnosis present

## 2019-03-29 DIAGNOSIS — C384 Malignant neoplasm of pleura: Secondary | ICD-10-CM | POA: Diagnosis not present

## 2019-03-29 DIAGNOSIS — C3490 Malignant neoplasm of unspecified part of unspecified bronchus or lung: Secondary | ICD-10-CM | POA: Diagnosis not present

## 2019-03-29 DIAGNOSIS — C7931 Secondary malignant neoplasm of brain: Secondary | ICD-10-CM | POA: Diagnosis not present

## 2019-03-29 DIAGNOSIS — Z419 Encounter for procedure for purposes other than remedying health state, unspecified: Secondary | ICD-10-CM

## 2019-03-29 DIAGNOSIS — Z79899 Other long term (current) drug therapy: Secondary | ICD-10-CM

## 2019-03-29 HISTORY — PX: CHEST TUBE INSERTION: SHX231

## 2019-03-29 HISTORY — PX: PORTACATH PLACEMENT: SHX2246

## 2019-03-29 HISTORY — PX: VIDEO ASSISTED THORACOSCOPY: SHX5073

## 2019-03-29 HISTORY — PX: PLEURAL BIOPSY: SHX5082

## 2019-03-29 LAB — GLUCOSE, CAPILLARY
Glucose-Capillary: 98 mg/dL (ref 70–99)
Glucose-Capillary: 104 mg/dL — ABNORMAL HIGH (ref 70–99)

## 2019-03-29 SURGERY — VIDEO ASSISTED THORACOSCOPY
Anesthesia: General | Site: Chest | Laterality: Right

## 2019-03-29 MED ORDER — HEPARIN SODIUM (PORCINE) 1000 UNIT/ML IJ SOLN
INTRAMUSCULAR | Status: AC
  Filled 2019-03-29: qty 1

## 2019-03-29 MED ORDER — HEPARIN SODIUM (PORCINE) 1000 UNIT/ML IJ SOLN
INTRAMUSCULAR | Status: DC | PRN
  Administered 2019-03-29: 2200 [IU]

## 2019-03-29 MED ORDER — ONDANSETRON HCL 4 MG/2ML IJ SOLN
INTRAMUSCULAR | Status: DC | PRN
  Administered 2019-03-29: 4 mg via INTRAVENOUS

## 2019-03-29 MED ORDER — SODIUM CHLORIDE 0.9 % IV SOLN
INTRAVENOUS | Status: DC | PRN
  Administered 2019-03-29: 60 ug/min via INTRAVENOUS

## 2019-03-29 MED ORDER — CEFAZOLIN SODIUM-DEXTROSE 2-4 GM/100ML-% IV SOLN
INTRAVENOUS | Status: AC
  Filled 2019-03-29: qty 100

## 2019-03-29 MED ORDER — BUPIVACAINE LIPOSOME 1.3 % IJ SUSP: INTRAMUSCULAR | Status: DC | PRN

## 2019-03-29 MED ORDER — DEXAMETHASONE SODIUM PHOSPHATE 10 MG/ML IJ SOLN
INTRAMUSCULAR | Status: DC | PRN
  Administered 2019-03-29: 10 mg via INTRAVENOUS

## 2019-03-29 MED ORDER — ACETAMINOPHEN 160 MG/5ML PO SOLN: 1000.0000 mg | Freq: Four times a day (QID) | ORAL | Status: DC

## 2019-03-29 MED ORDER — BUPIVACAINE LIPOSOME 1.3 % IJ SUSP
20.0000 mL | Freq: Once | INTRAMUSCULAR | Status: DC
  Filled 2019-03-29: qty 20

## 2019-03-29 MED ORDER — FENTANYL CITRATE (PF) 100 MCG/2ML IJ SOLN: 25.0000 ug | INTRAMUSCULAR | Status: DC | PRN

## 2019-03-29 MED ORDER — ROCURONIUM BROMIDE 50 MG/5ML IV SOSY
PREFILLED_SYRINGE | INTRAVENOUS | Status: DC | PRN
  Administered 2019-03-29: 50 mg via INTRAVENOUS
  Administered 2019-03-29: 20 mg via INTRAVENOUS

## 2019-03-29 MED ORDER — LIDOCAINE 2% (20 MG/ML) 5 ML SYRINGE
INTRAMUSCULAR | Status: AC
  Filled 2019-03-29: qty 5

## 2019-03-29 MED ORDER — ACETAMINOPHEN 160 MG/5ML PO SOLN: 325.0000 mg | Freq: Once | ORAL | Status: DC | PRN

## 2019-03-29 MED ORDER — FOLIC ACID 1 MG PO TABS
1.0000 mg | ORAL_TABLET | Freq: Every day | ORAL | Status: DC
  Administered 2019-03-30: 1 mg via ORAL
  Filled 2019-03-29: qty 1

## 2019-03-29 MED ORDER — MIDAZOLAM HCL 2 MG/2ML IJ SOLN
INTRAMUSCULAR | Status: AC
  Filled 2019-03-29: qty 2

## 2019-03-29 MED ORDER — TRAMADOL HCL 50 MG PO TABS
50.0000 mg | ORAL_TABLET | Freq: Four times a day (QID) | ORAL | Status: DC | PRN
  Administered 2019-03-29: 50 mg via ORAL
  Filled 2019-03-29: qty 1

## 2019-03-29 MED ORDER — ACETAMINOPHEN 10 MG/ML IV SOLN: 1000.0000 mg | Freq: Once | INTRAVENOUS | Status: DC

## 2019-03-29 MED ORDER — ACETAMINOPHEN 10 MG/ML IV SOLN
1000.0000 mg | Freq: Once | INTRAVENOUS | Status: DC | PRN
  Administered 2019-03-29: 1000 mg via INTRAVENOUS

## 2019-03-29 MED ORDER — SODIUM CHLORIDE 0.9 % IV SOLN
INTRAVENOUS | Status: AC
  Filled 2019-03-29: qty 1.2

## 2019-03-29 MED ORDER — ONDANSETRON HCL 4 MG/2ML IJ SOLN
INTRAMUSCULAR | Status: AC
  Filled 2019-03-29: qty 2

## 2019-03-29 MED ORDER — BUPIVACAINE HCL (PF) 0.5 % IJ SOLN
INTRAMUSCULAR | Status: AC
  Filled 2019-03-29: qty 30

## 2019-03-29 MED ORDER — FENTANYL CITRATE (PF) 100 MCG/2ML IJ SOLN
INTRAMUSCULAR | Status: AC
  Administered 2019-03-29: 75 ug via INTRAVENOUS
  Filled 2019-03-29: qty 2

## 2019-03-29 MED ORDER — ACETAMINOPHEN 325 MG PO TABS: 325.0000 mg | ORAL_TABLET | Freq: Once | ORAL | Status: DC | PRN

## 2019-03-29 MED ORDER — INSULIN ASPART 100 UNIT/ML ~~LOC~~ SOLN: 0.0000 [IU] | Freq: Four times a day (QID) | SUBCUTANEOUS | Status: DC

## 2019-03-29 MED ORDER — SUGAMMADEX SODIUM 200 MG/2ML IV SOLN
INTRAVENOUS | Status: DC | PRN
  Administered 2019-03-29: 200 mg via INTRAVENOUS

## 2019-03-29 MED ORDER — SODIUM CHLORIDE (PF) 0.9 % IJ SOLN: INTRAMUSCULAR | Status: DC | PRN

## 2019-03-29 MED ORDER — CEFAZOLIN SODIUM-DEXTROSE 2-4 GM/100ML-% IV SOLN
2.0000 g | Freq: Three times a day (TID) | INTRAVENOUS | Status: AC
  Administered 2019-03-30 (×2): 2 g via INTRAVENOUS
  Filled 2019-03-29 (×2): qty 100

## 2019-03-29 MED ORDER — ACETAMINOPHEN 10 MG/ML IV SOLN
INTRAVENOUS | Status: AC
  Filled 2019-03-29: qty 100

## 2019-03-29 MED ORDER — SUFENTANIL CITRATE 50 MCG/ML IV SOLN
INTRAVENOUS | Status: DC | PRN
  Administered 2019-03-29 (×2): 5 ug via INTRAVENOUS
  Administered 2019-03-29: 25 ug via INTRAVENOUS

## 2019-03-29 MED ORDER — LABETALOL HCL 5 MG/ML IV SOLN
INTRAVENOUS | Status: DC | PRN
  Administered 2019-03-29 (×2): 10 mg via INTRAVENOUS

## 2019-03-29 MED ORDER — MIDAZOLAM HCL 2 MG/2ML IJ SOLN
INTRAMUSCULAR | Status: AC
  Administered 2019-03-29: 2 mg via INTRAVENOUS
  Filled 2019-03-29: qty 2

## 2019-03-29 MED ORDER — SUCCINYLCHOLINE CHLORIDE 200 MG/10ML IV SOSY
PREFILLED_SYRINGE | INTRAVENOUS | Status: AC
  Filled 2019-03-29: qty 10

## 2019-03-29 MED ORDER — MEPERIDINE HCL 25 MG/ML IJ SOLN: 6.2500 mg | INTRAMUSCULAR | Status: DC | PRN

## 2019-03-29 MED ORDER — PROPOFOL 10 MG/ML IV BOLUS
INTRAVENOUS | Status: DC | PRN
  Administered 2019-03-29: 150 mg via INTRAVENOUS

## 2019-03-29 MED ORDER — PROMETHAZINE HCL 25 MG/ML IJ SOLN: 6.2500 mg | INTRAMUSCULAR | Status: DC | PRN

## 2019-03-29 MED ORDER — LIDOCAINE 2% (20 MG/ML) 5 ML SYRINGE
INTRAMUSCULAR | Status: DC | PRN
  Administered 2019-03-29: 30 mg via INTRAVENOUS
  Administered 2019-03-29: 60 mg via INTRAVENOUS

## 2019-03-29 MED ORDER — 0.9 % SODIUM CHLORIDE (POUR BTL) OPTIME
TOPICAL | Status: DC | PRN
  Administered 2019-03-29: 2000 mL

## 2019-03-29 MED ORDER — FENTANYL CITRATE (PF) 100 MCG/2ML IJ SOLN
75.0000 ug | Freq: Once | INTRAMUSCULAR | Status: AC
  Administered 2019-03-29: 13:00:00 75 ug via INTRAVENOUS

## 2019-03-29 MED ORDER — SENNOSIDES-DOCUSATE SODIUM 8.6-50 MG PO TABS
1.0000 | ORAL_TABLET | Freq: Every day | ORAL | Status: DC
  Filled 2019-03-29: qty 1

## 2019-03-29 MED ORDER — SODIUM CHLORIDE 0.9 % IV SOLN
INTRAVENOUS | Status: DC | PRN
  Administered 2019-03-29: 500 mL

## 2019-03-29 MED ORDER — ROCURONIUM BROMIDE 10 MG/ML (PF) SYRINGE
PREFILLED_SYRINGE | INTRAVENOUS | Status: AC
  Filled 2019-03-29: qty 10

## 2019-03-29 MED ORDER — SUCCINYLCHOLINE CHLORIDE 200 MG/10ML IV SOSY
PREFILLED_SYRINGE | INTRAVENOUS | Status: DC | PRN
  Administered 2019-03-29: 120 mg via INTRAVENOUS

## 2019-03-29 MED ORDER — ACETAMINOPHEN 500 MG PO TABS
1000.0000 mg | ORAL_TABLET | Freq: Four times a day (QID) | ORAL | Status: DC
  Administered 2019-03-29 – 2019-03-30 (×3): 1000 mg via ORAL
  Filled 2019-03-29 (×3): qty 2

## 2019-03-29 MED ORDER — SUFENTANIL CITRATE 50 MCG/ML IV SOLN
INTRAVENOUS | Status: AC
  Filled 2019-03-29: qty 1

## 2019-03-29 MED ORDER — STERILE WATER FOR IRRIGATION IR SOLN
Status: DC | PRN
  Administered 2019-03-29: 2000 mL

## 2019-03-29 MED ORDER — LACTATED RINGERS IV SOLN
INTRAVENOUS | Status: DC | PRN
  Administered 2019-03-29: 15:00:00 via INTRAVENOUS

## 2019-03-29 MED ORDER — PHENYLEPHRINE 40 MCG/ML (10ML) SYRINGE FOR IV PUSH (FOR BLOOD PRESSURE SUPPORT)
PREFILLED_SYRINGE | INTRAVENOUS | Status: AC
  Filled 2019-03-29: qty 10

## 2019-03-29 MED ORDER — LACTATED RINGERS IV SOLN: INTRAVENOUS | Status: DC

## 2019-03-29 MED ORDER — ONDANSETRON HCL 4 MG/2ML IJ SOLN: 4.0000 mg | Freq: Four times a day (QID) | INTRAMUSCULAR | Status: DC | PRN

## 2019-03-29 MED ORDER — PROPOFOL 10 MG/ML IV BOLUS
INTRAVENOUS | Status: AC
  Filled 2019-03-29: qty 20

## 2019-03-29 MED ORDER — DEXTROSE-NACL 5-0.9 % IV SOLN
INTRAVENOUS | Status: DC
  Administered 2019-03-29: 23:00:00 via INTRAVENOUS

## 2019-03-29 MED ORDER — MIDAZOLAM HCL 2 MG/2ML IJ SOLN
2.0000 mg | Freq: Once | INTRAMUSCULAR | Status: AC
  Administered 2019-03-29: 2 mg via INTRAVENOUS

## 2019-03-29 MED ORDER — BUPIVACAINE HCL (PF) 0.5 % IJ SOLN
INTRAMUSCULAR | Status: DC | PRN
  Administered 2019-03-29: 30 mL

## 2019-03-29 MED ORDER — CEFAZOLIN SODIUM-DEXTROSE 2-4 GM/100ML-% IV SOLN
2.0000 g | INTRAVENOUS | Status: AC
  Administered 2019-03-29: 2 g via INTRAVENOUS

## 2019-03-29 MED ORDER — PHENYLEPHRINE 40 MCG/ML (10ML) SYRINGE FOR IV PUSH (FOR BLOOD PRESSURE SUPPORT)
PREFILLED_SYRINGE | INTRAVENOUS | Status: DC | PRN
  Administered 2019-03-29: 80 ug via INTRAVENOUS
  Administered 2019-03-29: 120 ug via INTRAVENOUS

## 2019-03-29 MED ORDER — LACTATED RINGERS IV SOLN
INTRAVENOUS | Status: DC
  Administered 2019-03-29: 11:00:00 via INTRAVENOUS

## 2019-03-29 MED ORDER — MIDAZOLAM HCL 5 MG/5ML IJ SOLN
INTRAMUSCULAR | Status: DC | PRN
  Administered 2019-03-29: 2 mg via INTRAVENOUS

## 2019-03-29 MED ORDER — DEXAMETHASONE SODIUM PHOSPHATE 10 MG/ML IJ SOLN
INTRAMUSCULAR | Status: AC
  Filled 2019-03-29: qty 1

## 2019-03-29 SURGICAL SUPPLY — 102 items
APPLICATOR COTTON TIP 6 STRL (MISCELLANEOUS) IMPLANT
APPLICATOR COTTON TIP 6IN STRL (MISCELLANEOUS)
APPLIER CLIP ROT 10 11.4 M/L (STAPLE)
BAG DECANTER FOR FLEXI CONT (MISCELLANEOUS) ×8 IMPLANT
CANISTER SUCT 3000ML PPV (MISCELLANEOUS) ×8 IMPLANT
CATH THORACIC 28FR RT ANG (CATHETERS) IMPLANT
CATH THORACIC 36FR (CATHETERS) IMPLANT
CATH THORACIC 36FR RT ANG (CATHETERS) IMPLANT
CLIP APPLIE ROT 10 11.4 M/L (STAPLE) IMPLANT
CLIP VESOCCLUDE MED 6/CT (CLIP) IMPLANT
CONN Y 3/8X3/8X3/8  BEN (MISCELLANEOUS)
CONN Y 3/8X3/8X3/8 BEN (MISCELLANEOUS) IMPLANT
CONT SPEC 4OZ CLIKSEAL STRL BL (MISCELLANEOUS) ×12 IMPLANT
COVER SURGICAL LIGHT HANDLE (MISCELLANEOUS) ×4 IMPLANT
COVER WAND RF STERILE (DRAPES) IMPLANT
DERMABOND ADVANCED (GAUZE/BANDAGES/DRESSINGS) ×2
DERMABOND ADVANCED .7 DNX12 (GAUZE/BANDAGES/DRESSINGS) ×6 IMPLANT
DRAIN CHANNEL 28F RND 3/8 FF (WOUND CARE) IMPLANT
DRAIN CHANNEL 32F RND 10.7 FF (WOUND CARE) IMPLANT
DRAPE C-ARM 42X72 X-RAY (DRAPES) ×4 IMPLANT
DRAPE CHEST BREAST 15X10 FENES (DRAPES) ×4 IMPLANT
DRAPE LAPAROSCOPIC ABDOMINAL (DRAPES) ×4 IMPLANT
DRAPE SLUSH/WARMER DISC (DRAPES) ×4 IMPLANT
ELECT REM PT RETURN 9FT ADLT (ELECTROSURGICAL) ×8
ELECTRODE REM PT RTRN 9FT ADLT (ELECTROSURGICAL) ×6 IMPLANT
GAUZE SPONGE 4X4 12PLY STRL (GAUZE/BANDAGES/DRESSINGS) ×4 IMPLANT
GAUZE SPONGE 4X4 12PLY STRL LF (GAUZE/BANDAGES/DRESSINGS) ×4 IMPLANT
GLOVE BIO SURGEON STRL SZ 6 (GLOVE) ×4 IMPLANT
GLOVE BIO SURGEON STRL SZ 6.5 (GLOVE) ×12 IMPLANT
GLOVE BIOGEL PI IND STRL 6.5 (GLOVE) ×3 IMPLANT
GLOVE BIOGEL PI INDICATOR 6.5 (GLOVE) ×1
GLOVE SURG SIGNA 7.5 PF LTX (GLOVE) ×8 IMPLANT
GOWN STRL REUS W/ TWL LRG LVL3 (GOWN DISPOSABLE) ×12 IMPLANT
GOWN STRL REUS W/ TWL XL LVL3 (GOWN DISPOSABLE) ×6 IMPLANT
GOWN STRL REUS W/TWL LRG LVL3 (GOWN DISPOSABLE) ×4
GOWN STRL REUS W/TWL XL LVL3 (GOWN DISPOSABLE) ×2
GUIDEWIRE UNCOATED ST S 7038 (WIRE) IMPLANT
HEMOSTAT SURGICEL 2X14 (HEMOSTASIS) IMPLANT
INTRODUCER 13FR (INTRODUCER) IMPLANT
INTRODUCER COOK 11FR (CATHETERS) IMPLANT
IV CATH 18G X1.75 CATHLON (IV SOLUTION) ×4 IMPLANT
IV CATH 22GX1 FEP (IV SOLUTION) IMPLANT
KIT BASIN OR (CUSTOM PROCEDURE TRAY) ×4 IMPLANT
KIT PLEURX DRAIN CATH 1000ML (MISCELLANEOUS) IMPLANT
KIT PLEURX DRAIN CATH 15.5FR (DRAIN) ×4 IMPLANT
KIT PORT POWER 9.6FR MRI PREA (Stent) ×4 IMPLANT
KIT SUCTION CATH 14FR (SUCTIONS) ×4 IMPLANT
KIT TURNOVER KIT B (KITS) ×4 IMPLANT
NEEDLE SPNL 18GX3.5 QUINCKE PK (NEEDLE) ×4 IMPLANT
NS IRRIG 1000ML POUR BTL (IV SOLUTION) ×8 IMPLANT
PACK CHEST (CUSTOM PROCEDURE TRAY) ×4 IMPLANT
PACK GENERAL/GYN (CUSTOM PROCEDURE TRAY) ×4 IMPLANT
PAD ARMBOARD 7.5X6 YLW CONV (MISCELLANEOUS) ×8 IMPLANT
PENCIL BUTTON HOLSTER BLD 10FT (ELECTRODE) ×4 IMPLANT
POUCH ENDO CATCH II 15MM (MISCELLANEOUS) ×4 IMPLANT
POUCH SPECIMEN RETRIEVAL 10MM (ENDOMECHANICALS) IMPLANT
SEALANT PROGEL (MISCELLANEOUS) IMPLANT
SEALANT SURG COSEAL 4ML (VASCULAR PRODUCTS) IMPLANT
SEALANT SURG COSEAL 8ML (VASCULAR PRODUCTS) IMPLANT
SET DRAINAGE LINE (MISCELLANEOUS) IMPLANT
SET SHEATH INTRODUCER 10FR (MISCELLANEOUS) IMPLANT
SOLUTION ANTI FOG 6CC (MISCELLANEOUS) ×4 IMPLANT
SPECIMEN JAR MEDIUM (MISCELLANEOUS) ×4 IMPLANT
SPONGE INTESTINAL PEANUT (DISPOSABLE) ×20 IMPLANT
SPONGE TONSIL TAPE 1 RFD (DISPOSABLE) ×4 IMPLANT
SUT ETHILON 3 0 FSL (SUTURE) ×8 IMPLANT
SUT MNCRL AB 4-0 PS2 18 (SUTURE) IMPLANT
SUT PROLENE 4 0 RB 1 (SUTURE)
SUT PROLENE 4-0 RB1 .5 CRCL 36 (SUTURE) IMPLANT
SUT SILK  1 MH (SUTURE) ×2
SUT SILK 1 MH (SUTURE) ×6 IMPLANT
SUT SILK 2 0SH CR/8 30 (SUTURE) ×4 IMPLANT
SUT SILK 3 0SH CR/8 30 (SUTURE) IMPLANT
SUT VIC AB 1 CTX 36 (SUTURE)
SUT VIC AB 1 CTX36XBRD ANBCTR (SUTURE) IMPLANT
SUT VIC AB 2-0 CT1 27 (SUTURE) ×1
SUT VIC AB 2-0 CT1 TAPERPNT 27 (SUTURE) ×3 IMPLANT
SUT VIC AB 2-0 CTX 36 (SUTURE) IMPLANT
SUT VIC AB 2-0 UR6 27 (SUTURE) IMPLANT
SUT VIC AB 3-0 MH 27 (SUTURE) IMPLANT
SUT VIC AB 3-0 SH 27 (SUTURE) ×2
SUT VIC AB 3-0 SH 27X BRD (SUTURE) ×6 IMPLANT
SUT VIC AB 3-0 X1 27 (SUTURE) ×8 IMPLANT
SUT VIC AB 4-0 PS2 27 (SUTURE) ×4 IMPLANT
SUT VICRYL 2 TP 1 (SUTURE) IMPLANT
SUT VICRYL 4-0 PS2 18IN ABS (SUTURE) ×4 IMPLANT
SYR 10ML LL (SYRINGE) ×4 IMPLANT
SYR 20ML LL LF (SYRINGE) ×4 IMPLANT
SYR 5ML LL (SYRINGE) ×4 IMPLANT
SYR CONTROL 10ML LL (SYRINGE) IMPLANT
SYSTEM SAHARA CHEST DRAIN ATS (WOUND CARE) ×4 IMPLANT
TAPE CLOTH 4X10 WHT NS (GAUZE/BANDAGES/DRESSINGS) ×4 IMPLANT
TAPE CLOTH SURG 4X10 WHT LF (GAUZE/BANDAGES/DRESSINGS) ×4 IMPLANT
TIP APPLICATOR SPRAY EXTEND 16 (VASCULAR PRODUCTS) IMPLANT
TOWEL GREEN STERILE (TOWEL DISPOSABLE) ×4 IMPLANT
TOWEL GREEN STERILE FF (TOWEL DISPOSABLE) ×8 IMPLANT
TRAY FOLEY MTR SLVR 16FR STAT (SET/KITS/TRAYS/PACK) IMPLANT
TROCAR XCEL BLADELESS 5X75MML (TROCAR) ×4 IMPLANT
TROCAR XCEL NON-BLD 5MMX100MML (ENDOMECHANICALS) IMPLANT
VALVE REPLACEMENT CAP (MISCELLANEOUS) IMPLANT
WATER STERILE IRR 1000ML POUR (IV SOLUTION) ×8 IMPLANT
WIRE EMERALD 3MM-J .035X150CM (WIRE) IMPLANT

## 2019-03-29 NOTE — Interval H&P Note (Signed)
History and Physical Interval Note:  Asked by Dr. Julien Nordmann to place portacath at time of pleural biopsy. Informed patient of risks and benefits and he agrees to placement  03/29/2019 1:57 PM  Ethan Brown  has presented today for surgery, with the diagnosis of MALIGNANT RIGHT PLEURAL EFFUSION.  The various methods of treatment have been discussed with the patient and family. After consideration of risks, benefits and other options for treatment, the patient has consented to  Procedure(s): VIDEO ASSISTED THORACOSCOPY (Right) PLEURAL BIOPSY (Right) INSERTION PLEURAL DRAINAGE CATHETER (Right) INSERTION PORT-A-CATH (N/A) as a surgical intervention.  The patient's history has been reviewed, patient examined, no change in status, stable for surgery.  I have reviewed the patient's chart and labs.  Questions were answered to the patient's satisfaction.     [name]

## 2019-03-29 NOTE — Anesthesia Procedure Notes (Signed)
Arterial Line Insertion Start/End8/12/2018 11:50 AM, 03/29/2019 12:00 PM Performed by: Belinda Block, MD, Eligha Bridegroom, CRNA, anesthesiologist  Preanesthetic checklist: patient identified, IV checked, site marked, risks and benefits discussed, surgical consent, monitors and equipment checked and pre-op evaluation Lidocaine 1% used for infiltration radial was placed Catheter size: 20 G Hand hygiene performed , maximum sterile barriers used  and Seldinger technique used Allen's test indicative of satisfactory collateral circulation Attempts: 1 Procedure performed without using ultrasound guided technique. Following insertion, dressing applied and Biopatch. Post procedure assessment: normal  Patient tolerated the procedure well with no immediate complications.

## 2019-03-29 NOTE — Transfer of Care (Signed)
Immediate Anesthesia Transfer of Care Note  Patient: Ethan Brown  Procedure(s) Performed: VIDEO ASSISTED THORACOSCOPY (Right Chest) PLEURAL BIOPSY (Right ) INSERTION PLEURAL DRAINAGE CATHETER (Right ) INSERTION PORT-A-CATH (N/A )  Patient Location: PACU  Anesthesia Type:General  Level of Consciousness: awake and alert   Airway & Oxygen Therapy: Patient Spontanous Breathing and Patient connected to nasal cannula oxygen  Post-op Assessment: Report given to RN and Post -op Vital signs reviewed and stable  Post vital signs: Reviewed and stable  Last Vitals:  Vitals Value Taken Time  BP    Temp    Pulse 89 03/29/19 1709  Resp    SpO2 92 % 03/29/19 1709  Vitals shown include unvalidated device data.  Last Pain:  Vitals:   03/29/19 1310  TempSrc:   PainSc: 0-No pain      Patients Stated Pain Goal: 0 (41/74/08 1448)  Complications: No apparent anesthesia complications

## 2019-03-29 NOTE — Anesthesia Procedure Notes (Signed)
Procedure Name: Intubation Date/Time: 03/29/2019 3:03 PM Performed by: Eligha Bridegroom, CRNA Pre-anesthesia Checklist: Patient identified, Emergency Drugs available, Suction available and Patient being monitored Patient Re-evaluated:Patient Re-evaluated prior to induction Oxygen Delivery Method: Circle System Utilized Preoxygenation: Pre-oxygenation with 100% oxygen Induction Type: IV induction Ventilation: Mask ventilation without difficulty and Oral airway inserted - appropriate to patient size Laryngoscope Size: Mac and 3 Grade View: Grade I Endobronchial tube: Left and Double lumen EBT and 39 Fr Number of attempts: 1 Airway Equipment and Method: Stylet and Oral airway Placement Confirmation: ETT inserted through vocal cords under direct vision,  positive ETCO2 and breath sounds checked- equal and bilateral Secured at: 31 cm Tube secured with: Tape Dental Injury: Teeth and Oropharynx as per pre-operative assessment

## 2019-03-29 NOTE — Anesthesia Procedure Notes (Signed)
Central Venous Catheter Insertion Performed by: Belinda Block, MD, anesthesiologist Start/End8/12/2018 1:15 PM, 03/29/2019 1:30 PM Patient location: Pre-op. Preanesthetic checklist: patient identified, IV checked, site marked, risks and benefits discussed, surgical consent, monitors and equipment checked, pre-op evaluation and timeout performed Position: Trendelenburg Lidocaine 1% used for infiltration and patient sedated Hand hygiene performed , maximum sterile barriers used  and Seldinger technique used Central line was placed.Double lumen Procedure performed using ultrasound guided technique. Ultrasound Notes:anatomy identified and image(s) printed for medical record Attempts: 1 Following insertion, line sutured, dressing applied and Biopatch. Post procedure assessment: blood return through all ports  Patient tolerated the procedure well with no immediate complications.

## 2019-03-29 NOTE — Brief Op Note (Addendum)
03/29/2019  4:49 PM  PATIENT:  Ethan Brown  68 y.o. male  PRE-OPERATIVE DIAGNOSIS:  1. STAGE IV ADENOCARCINOMA 2.MALIGNANT RIGHT PLEURAL EFFUSION  POST-OPERATIVE DIAGNOSIS:  1. STAGE IV ADENOCARCINOMA 2.MALIGNANT RIGHT PLEURAL EFFUSION  PROCEDURE:   INSERTION PORT-A-CATH,  RIGHT VIDEO ASSISTED THORACOSCOPY,  PLEURAL BIOPSY, INSERTION PLEURAL DRAINAGE CATHETER   SURGEON:  Surgeon(s) and Role:    [name], MD - Primary  PHYSICIAN ASSISTANT: Lars Pinks PA-C  ANESTHESIA:   general  EBL:  10 mL  2300 of right pleural fluid removed  BLOOD ADMINISTERED:none  DRAINS: Right Pleur X catheter in the right pleural space   SPECIMEN:  Source of Specimen:  Right pleural biopsies and right pleural fluid DISPOSITION OF SPECIMEN:  PATHOLOGY  COUNTS CORRECT:  YES  DICTATION: done  PLAN OF CARE: Admit for overnight observation  PATIENT DISPOSITION:  PACU - hemodynamically stable.   Delay start of Pharmacological VTE agent (>24hrs) due to surgical blood loss or risk of bleeding: yes

## 2019-03-30 ENCOUNTER — Inpatient Hospital Stay (HOSPITAL_COMMUNITY): Payer: Medicare Other

## 2019-03-30 LAB — GLUCOSE, CAPILLARY
Glucose-Capillary: 116 mg/dL — ABNORMAL HIGH (ref 70–99)
Glucose-Capillary: 119 mg/dL — ABNORMAL HIGH (ref 70–99)
Glucose-Capillary: 142 mg/dL — ABNORMAL HIGH (ref 70–99)

## 2019-03-30 MED ORDER — TRAMADOL HCL 50 MG PO TABS: 50.0000 mg | ORAL_TABLET | Freq: Four times a day (QID) | ORAL | 0 refills | Status: DC | PRN

## 2019-03-30 NOTE — TOC Transition Note (Addendum)
Transition of Care [facility]) - CM/SW Discharge Note Marvetta Gibbons RN, BSN Transitions of Care Unit 4E- RN Case Manager [phone]   Patient Details  Name: Ethan Brown MRN: [id number] Date of Birth: 1950/11/12  Transition of Care [facility]) CM/SW Contact:  Dawayne Patricia, RN Phone Number: 03/30/2019, 1:29 PM   Clinical Narrative:    Pt stable for transition home, orders placed for [facility] and pleurx cath needs- have requested box of 10 drainage kits for home to be sent to room. Cm also spoke with pt at bedside regarding [place] needs- per pt his wife is a Marine scientist and will be able to assist with drain- list provided Per CMS guidelines from medicare.gov website with star ratings (copy placed in shadow chart)- per pt he is not familiar with any of the [facility] and asks CM to "find one that will be good for the drain" call made to [facility] however they are unable to accept referral- call made to [facility]- and per [facility] referral has been accepted for [facility] pleurX cath needs ok.  PleurX forms have been signed by MD and filled out by CM- forms faxed to [facility] for further needs- pt provided copy of phone # for [facility] to be mailed.    Final next level of care: [place] Barriers to Discharge: No Barriers Identified   Patient Goals and CMS Choice Patient states their goals for this hospitalization and ongoing recovery are:: go home CMS Medicare.gov Compare Post Acute Care list provided to:: Patient Choice offered to / list presented to : Patient  Discharge Placement  Home with [facility]                     Discharge Plan and Services   Discharge Planning Services: CM Consult Post Acute Care Choice: Home Health, Durable Medical Equipment          DME Arranged: Other see comment DME Agency: ([facility]- faxed order)       HH Arranged: RN [place] Agency: [place] Date [facility] Agency Contacted: 03/30/19 Time [place]: 66 Representative spoke with at  [place]: [place] Interventions     Readmission Risk Interventions Readmission Risk Prevention Plan 03/30/2019  Post Dischage Appt Complete  Medication Screening Complete  Transportation Screening Complete  Some recent data might be hidden

## 2019-03-30 NOTE — Discharge Instructions (Signed)
Daily Pleurix drainage to start 03/31/2019: If <150 ml /drainage session x3 consecutive occasions - QOD drainage If <150 ml /drainage session x3 consecutive occasions on  QOD drainage schedule- call [facility] office  [phone]) for evaluation and possible removal Please keep a record of drainage and bring to office appointment  Implanted Port Insertion Implanted port insertion is a procedure to put in a port and catheter. The port is a device with an injectable disk that can be accessed by your health care provider. The port is connected to a vein in the chest or neck by a small flexible tube (catheter). There are different types of ports. The implanted port may be used as a long-term IV access for:  Medicines, such as chemotherapy.  Fluids.  Liquid nutrition, such as total parenteral nutrition (TPN). When you have a port, this means that your health care provider will not need to use the veins in your arms for these procedures. Tell a health care provider about:  Any allergies you have.  All medicines you are taking, especially blood thinners, as well as any vitamins, herbs, eye drops, creams, over-the-counter medicines, and steroids.  Any problems you or family members have had with anesthetic medicines.  Any blood disorders you have.  Any surgeries you have had.  Any medical conditions you have or have had, including diabetes or kidney problems.  Whether you are pregnant or may be pregnant. What are the risks? Generally, this is a safe procedure. However, problems may occur, including:  Allergic reactions to medicines or dyes.  Damage to other structures or organs.  Infection.  Damage to the blood vessel, bruising, or bleeding at the puncture site.  Blood clot.  Breakdown of the skin over the port.  A collection of air in the chest that can cause one of the lungs to collapse (pneumothorax). This is rare. What happens before the procedure? Medicines  Ask your health  care provider about: ? Changing or stopping your regular medicines. This is especially important if you are taking diabetes medicines or blood thinners. ? Taking medicines such as aspirin and ibuprofen. These medicines can thin your blood. Do not take these medicines unless your health care provider tells you to take them. ? Taking over-the-counter medicines, vitamins, herbs, and supplements. Staying hydrated Follow instructions from your health care provider about hydration, which may include:  Up to 2 hours before the procedure - you may continue to drink clear liquids, such as water, clear fruit juice, black coffee, and plain tea.  Eating and drinking restrictions  Follow instructions from your health care provider about eating and drinking, which may include: ? 8 hours before the procedure - stop eating heavy meals or foods, such as meat, fried foods, or fatty foods. ? 6 hours before the procedure - stop eating light meals or foods, such as toast or cereal. ? 6 hours before the procedure - stop drinking milk or drinks that contain milk. ? 2 hours before the procedure - stop drinking clear liquids. General instructions  Plan to have someone take you home from the hospital or clinic.  If you will be going home right after the procedure, plan to have someone with you for 24 hours.  You may have blood tests.  Do not use any products that contain nicotine or tobacco for at least 4-6 weeks before the procedure. These products include cigarettes, e-cigarettes, and chewing tobacco. If you need help quitting, ask your health care provider.  Ask your health care  provider what steps will be taken to help prevent infection. These may include: ? Removing hair at the surgery site. ? Washing skin with a germ-killing soap. ? Taking antibiotic medicine. What happens during the procedure?   An IV will be inserted into one of your veins.  You will be given one or more of the following: ? A  medicine to help you relax (sedative). ? A medicine to numb the area (local anesthetic).  Two small incisions will be made to insert the port. ? One smaller incision will be made in your neck to get access to the vein where the catheter will lie. ? The other incision will be made in the upper chest. This is where the port will lie.  The procedure may be done using continuous X-ray (fluoroscopy) or other imaging tools for guidance.  The port and catheter will be placed. There may be a small, raised area where the port is.  The port will be flushed with a salt solution (saline), and blood will be drawn to make sure that it is working correctly.  The incisions will be closed.  Bandages (dressings) may be placed over the incisions. The procedure may vary among health care providers and hospitals. What happens after the procedure?  Your blood pressure, heart rate, breathing rate, and blood oxygen level will be monitored until you leave the hospital or clinic.  Do not drive for 24 hours if you were given a sedative during your procedure.  You will be given a manufacturer's information card for the type of port that you have. Keep this with you.  Your port will need to be flushed and checked as told by your health care provider, usually every few weeks.  A chest X-ray will be done to: ? Check the placement of the port. ? Make sure there is no injury to your lung. Summary  Implanted port insertion is a procedure to put in a port and catheter.  The implanted port is used as a long-term IV access.  The port will need to be flushed and checked as told by your health care provider, usually every few weeks.  Keep your manufacturer's information card with you at all times. This information is not intended to replace advice given to you by your health care provider. Make sure you discuss any questions you have with your health care provider. Document Released: 05/31/2013 Document Revised:  12/02/2018 Document Reviewed: 03/08/2018 Elsevier Patient Education  [place].

## 2019-03-30 NOTE — Anesthesia Postprocedure Evaluation (Signed)
Anesthesia Post Note  Patient: Ethan Brown  Procedure(s) Performed: VIDEO ASSISTED THORACOSCOPY (Right Chest) PLEURAL BIOPSY (Right ) INSERTION PLEURAL DRAINAGE CATHETER (Right ) INSERTION PORT-A-CATH (N/A )     Patient location during evaluation: PACU Anesthesia Type: General Level of consciousness: awake and alert Pain management: pain level controlled Vital Signs Assessment: post-procedure vital signs reviewed and stable Respiratory status: spontaneous breathing, nonlabored ventilation, respiratory function stable and patient connected to nasal cannula oxygen Cardiovascular status: blood pressure returned to baseline and stable Postop Assessment: no apparent nausea or vomiting Anesthetic complications: no    Last Vitals:  Vitals:   03/30/19 0017 03/30/19 0403  BP: 119/78 114/69  Pulse: 88 79  Resp: 20 (!) 23  Temp: 37.2 C (!) 36.4 C  SpO2: 96% 97%    Last Pain:  Vitals:   03/30/19 0403  TempSrc: Oral  PainSc:                  Effie Berkshire

## 2019-03-30 NOTE — Op Note (Signed)
NAME: Ethan Brown, Ethan Brown MEDICAL RECORD UY:[id number] ACCOUNT [id number] DATE OF BIRTH:1951-02-28 FACILITY: [facility] LOCATION: [facility] PHYSICIAN:Ethan Beauregard Chaya Jan, MD  OPERATIVE REPORT  DATE OF PROCEDURE:  03/29/2019  PREOPERATIVE DIAGNOSIS:  Stage IV lung cancer with a malignant right pleural effusion.  POSTOPERATIVE DIAGNOSIS:  Stage IV lung cancer with a malignant right pleural effusion.  PROCEDURES:   1. Port-A-Cath placement left subclavian vein, 2. Right video-assisted thoracoscopy, drainage of pleural effusion, pleural biopsy, pleural catheter placement.  SURGEON:  Ethan Charon, MD  ASSISTANT:  Ethan Pinks, PA-C  ANESTHESIA:  General.  FINDINGS:  2.3 liters of serous fluid evacuated from the right chest.  Extensive tumor studding on the pleural surface.  CLINICAL NOTE:  [name] is a 68 year old nonsmoker, recently diagnosed with stage IV lung cancer with a malignant right pleural effusion.  There was not sufficient tissue for molecular testing from his original diagnostic specimen and he was sent for  biopsy to obtain tissue for that, as well as management of his pleural effusion and to establish intravenous access for chemotherapy.  The indications, risks, benefits, and alternatives were discussed in detail with the patient.  He understood and  accepted the risks and agreed to proceed.  DESCRIPTION OF PROCEDURE:  The patient was brought to the operating room on 03/29/2019.  He had induction of general anesthesia and was intubated with a double lumen endotracheal tube.  Intravenous antibiotics were administered.  Sequential compression  devices were placed on the calves for DVT prophylaxis with the patient in supine position, the chest and neck were prepped and draped in the usual sterile fashion.  A timeout was performed confirming correct site, procedure, and patient and appropriate  timing of antibiotics.  The patient was placed in Trendelenburg  position.  The right subclavian vein was accessed using the Seldinger technique.  The wire advanced easily.  Fluoroscopy confirmed position of the wire within the right atrium.  A 9.5 [place]  PowerPort catheter was cut to length at 15 cm and pre-flushed with heparinized saline.  An incision was made incorporating the wire and a pocket was created.  The Peel-Away sheath introducer was advanced over the wire.  The inner cannula was removed as  was the wire.  The Port-A-Cath then was advanced through the Peel-Away sheath introducer, which was removed.  Fluoroscopy confirmed the tip of the catheter in the superior vena cava just above the right atrium.  There was excellent return through the  port.  The port was flushed with heparinized saline.  The port was tacked to the pectoralis fascia with 3-0 Vicryl sutures.  The port then was flushed with 2.4 mL of concentrated heparin.  Meticulous hemostasis was achieved.  The incision was closed with  a running 3-0 Vicryl subcutaneous suture and a 4-0 Vicryl subcuticular suture.  Dermabond was applied.    A roll then was placed under the patient's right side, elevating his right chest, and the right chest and abdomen were prepped and draped in the usual sterile fashion.  The timeout was repeated.  An incision was made in the 8th interspace in the  anterior axillary line.  The chest was bluntly entered using a hemostat.  A sucker was advanced into the chest and 2.3 liters of serous fluid was evacuated.  The fluid was sent for cytology.  A port was inserted and the 5 mm thoracoscope was advanced  into the chest.  The pleural effusion was nearly completely resolved.  There were extensive pleural implants on the parietal  pleural surface.  A small incision was made in the 6th interspace laterally and a grasping instrument was used to obtain multiple  biopsies of the parietal pleura.  These were all sent for permanent pathology and with plans to send to [facility] for  molecular testing.  There was some minor bleeding with the pleural biopsies.  The scope then was repositioned to the 6th interspace  incision.  A small stab incision was made on the anterior abdominal wall for the exit site for the pleural catheter.  The wire for the pleural catheter was advanced through the original incision in the 8th interspace.  The catheter was tunneled from the exit  site to the posterior incision.  A Peel-Away sheath introducer was advanced over the wire.  The catheter then was advanced through the introducer, which was removed.  The cuff of the catheter was left just under the skin at the exit site.  Visualization  with the scope showed good positioning of the pleural catheter.  The catheter was secured at the exit site with a 3-0 nylon suture.  The scope was removed.  The 2 remaining port incisions were closed with 3-0 Vicryl subcuticular sutures.  Dermabond was  applied.  The pleural catheter was placed to straight drainage on waterseal.   The patient was extubated in the operating room and taken to the [facility] in good condition.  AN/NUANCE  D:03/29/2019 T:03/30/2019 JOB:[id number]

## 2019-03-30 NOTE — Addendum Note (Signed)
Addendum  created 03/30/19 1545 by Eligha Bridegroom, CRNA   Intraprocedure Event edited

## 2019-03-30 NOTE — Progress Notes (Addendum)
      [address]       [place] [address]             [phone]       1 Day Post-Op Procedure(s) (LRB): VIDEO ASSISTED THORACOSCOPY (Right) PLEURAL BIOPSY (Right) INSERTION PLEURAL DRAINAGE CATHETER (Right) INSERTION PORT-A-CATH (N/A)  Subjective: Patient without complaints this am. He hopes go go home.  Objective: Vital signs in last 24 hours: Temp:  [97.3 F (36.3 C)-99 F (37.2 C)] 97.5 F (36.4 C) (08/06 0403) Pulse Rate:  [68-89] 79 (08/06 0403) Cardiac Rhythm: Normal sinus rhythm (08/05 2000) Resp:  [0-23] 23 (08/06 0403) BP: (114-159)/(69-92) 114/69 (08/06 0403) SpO2:  [92 %-100 %] 97 % (08/06 0403) Arterial Line BP: (130-157)/(66-79) 138/69 (08/05 1810) Weight:  [88.9 kg] 88.9 kg (08/05 1114)     Intake/Output from previous day: 08/05 0701 - 08/06 0700 In: 3446.7 [P.O.:280; I.V.:2200; IV Piggyback:966.7] Out: 3550 [Urine:780; Blood:10; Chest Tube:460]   Physical Exam:  Cardiovascular: RRR Pulmonary: Clear to auscultation on the left and slightly diminished right base. Wounds: Port  A cath wound is clean and dry.  No erythema or signs of infection. Right pleur X dressing is clean and dry Right pleur X catheter is to Pleura Vac, sero sanguinous drainage  Lab Results: CBC: Recent Labs    03/28/19 1134  WBC 7.5  HGB 16.3  HCT 48.3  PLT 264   BMET:  Recent Labs    03/28/19 1134  NA 139  K 4.5  CL 110  CO2 18*  GLUCOSE 110*  BUN 20  CREATININE 0.90  CALCIUM 9.0    PT/INR:  Recent Labs    03/28/19 1134  LABPROT 13.5  INR 1.0   ABG:  INR: Will add last result for INR, ABG once components are confirmed Will add last 4 CBG results once components are confirmed  Assessment/Plan:  1. CV - SR in the 70-80's. 2.  Pulmonary - On room air . Right pleur X catheter with 460 of output. CXR this am appears stable. 3. HH will be arranged to drain Pleur X catheter. Patient will be discharged once CXR officially  interpreted  Konrad Penta 03/30/2019,7:43 AM [phone] Patient seen and examined, agree with above Home today Will arrange [facility] for pleural catheter drainage- will start draining every day initially  Remo Lipps C. Roxan Hockey, MD [facility] [phone]

## 2019-03-30 NOTE — Discharge Summary (Signed)
Physician Discharge Summary       [address]       [place] [address]             [phone]    Patient ID: Ethan Brown MRN: [id number] DOB/AGE: [birth year] 68 y.o.  Admit date: 03/29/2019 Discharge date: 03/30/2019  Admission Diagnoses: 1. Stage IV lung cancer 2. Malignant right pleural effusion  Discharge Diagnoses:  1. S/p infusaport, right Pleur X catheter, right pleural biopsies 2. History of borderline hypertension 3. History of Hyperlipidemia 4. History of TGA (transient global amnesia) 5. History of detached vitreous humor, left 6. History of migraine headache 7. History of ED (erectile dysfunction) of organic origin   Procedure (s):  Port-A-Cath placement, left subclavian vein, right video-assisted thoracoscopy, drainage of pleural effusion, pleural biopsy, pleural catheter placement by Dr.Hendrickson on 03/29/2019.  History of Presenting Illness: Durrel Mcnee is a 68 year old non-smoker with a past medical history significant for borderline hypertension, hyperlipidemia, migraines, and transient global amnesia.  He has smoked an occasional cigar (3-4 times a year) but never has smoked cigarettes.  He was in his usual state of health until this past winter.  He developed a cough, which in itself was not unusual.  In March he had an episode of pleuritic chest pain but did not think much of it.  His cough persisted and he finally sought medical attention in June.  He had a chest x-ray which led to a CT of the chest.  CT of the chest showed a 3.4 x 2.4 cm right middle lobe mass extending into the right upper lobe.  There were multiple pleural densities and a moderate right pleural effusion.  He had a thoracentesis.  He passed out during the procedure and only 60 mL of fluid was drained.  Cytology was positive for metastatic adenocarcinoma, but there was not sufficient tissue for molecular testing.  He had a second thoracentesis on 03/13/2019 and  1.9 L of pleural fluid was drained.  Unfortunately that was not sent for cytology.  He saw Dr. Julien Nordmann.  Blood tests were done for molecular studies.  He felt better for couple of days after the thoracentesis.  However, he is starting to feel like the fluid may be building back up.  Dr. Roxan Hockey recommended that we do a right VATS to drain his pleural effusion, do pleural biopsies, and place a pleural drainage catheter.  We would do this in the operating room under general anesthesia.  Dr. Roxan Hockey would plan to keep him overnight afterwards.  Dr. Roxan Hockey informed him of the general nature of the procedure including the need for general anesthesia, the incision to be used, the use of an indwelling catheter for long-term management of the effusion, the expected overnight stay, and the overall recovery.  Patient presented to [facility] in order to undergo an infusaport, right Pleur x catheter, and right pleural biopsies.  Brief Hospital Course:  Patient was observed over night. He is afebrile, vital signs are stable. He is tolerating a diet. Right pleur X cathter is to Pleura Vac, has 460 cc sero sanguinous output since surgery. Chest x ray this am is stable. HH to be arranged to drain Pleur X catheter. Office will contact patient with follow up appointment. As discussed with Dr. Roxan Hockey, patient is stable for discharge.    Latest Vital Signs: Blood pressure 114/69, pulse 79, temperature (!) 97.5 F (36.4 C), temperature source Oral, resp. rate (!) 23, height 5\' 11"  (1.803 m),  weight 88.9 kg, SpO2 97 %.  Physical Exam: Cardiovascular: RRR Pulmonary: Clear to auscultation on the left and slightly diminished right base. Wounds: Port  A cath wound is clean and dry.  No erythema or signs of infection. Right pleur X dressing is clean and dry Right pleur X catheter is to Pleura Vac, sero sanguinous drainage   Discharge Condition:Stable and discharged to home.  Recent  laboratory studies:  Lab Results  Component Value Date   WBC 7.5 03/28/2019   HGB 16.3 03/28/2019   HCT 48.3 03/28/2019   MCV 93.8 03/28/2019   PLT 264 03/28/2019   Lab Results  Component Value Date   NA 139 03/28/2019   K 4.5 03/28/2019   CL 110 03/28/2019   CO2 18 (L) 03/28/2019   CREATININE 0.90 03/28/2019   GLUCOSE 110 (H) 03/28/2019      Diagnostic Studies: Dg Chest 1 View  Result Date: 03/13/2019 CLINICAL DATA:  Post right thoracentesis EXAM: CHEST  1 VIEW COMPARISON:  02/22/2019 FINDINGS: Decreasing right effusion and right base atelectasis. No pneumothorax following thoracentesis. Left lung clear. Heart is normal size. No acute bony abnormality. IMPRESSION: Small right effusion and right base atelectasis, both improving since prior study. No pneumothorax following thoracentesis. Electronically Signed   By: Rolm Baptise M.D.   On: 03/13/2019 11:06   Dg Chest 2 View  Result Date: 03/28/2019 CLINICAL DATA:  Pre-admission for right VATS and pleural biopsy EXAM: CHEST - 2 VIEW COMPARISON:  None. FINDINGS: Masslike widening of the right paratracheal stripe and nodular opacities in the right hilum and right lung compatible with masses seen on prior CT. Persistent right pleural effusion with fluid tracking in the minor fissure. Left lung is largely clear. Remaining aerated portions of the right lung are clear. No acute osseous or soft tissue abnormality. Right heart border is largely obscured by the right effusion. The left heart border is unremarkable. IMPRESSION: 1. Redemonstrated nodular masses in the right lung, mediastinum and hilum 2. Persistent right pleural effusion with fluid tracking in the minor fissure. Electronically Signed   By: Lovena Le M.D.   On: 03/28/2019 15:52   Dg Chest 2 View  Result Date: 03/21/2019 CLINICAL DATA:  Shortness of breath and pleural effusion. History of lung carcinoma EXAM: CHEST - 2 VIEW COMPARISON:  March 13, 2019 FINDINGS: There is persistent  pleural effusion on the right which may be slightly increased compared to recent study. There is patchy atelectasis in the right middle and lower lobe regions. The lungs elsewhere are clear. Heart size and pulmonary vascularity are normal. No adenopathy. No bone lesions. No pneumothorax. IMPRESSION: Persistent right pleural effusion, marginally larger than on recent study. Atelectatic change in the right middle and lower lobes inferiorly. Lungs elsewhere clear. Stable cardiac silhouette. Electronically Signed   By: Lowella Grip III M.D.   On: 03/21/2019 15:47   Mr Jeri Cos SL Contrast  Result Date: 03/27/2019 CLINICAL DATA:  68 year old male initial staging, lung cancer with malignant pleural effusion. EXAM: MRI HEAD WITHOUT AND WITH CONTRAST TECHNIQUE: Multiplanar, multiecho pulse sequences of the brain and surrounding structures were obtained without and with intravenous contrast. CONTRAST:  7.5 milliliters Gadavist COMPARISON:  PET-CT today reported separately. FINDINGS: Brain: There is a round 6-7 millimeter enhancing lesion at the right sensory strip on series 11, image 77 and series 13, image 6. No associated edema or mass effect. There is faint hemosiderin within the lesion on series 7, image 24. No other abnormal enhancement identified.  No dural thickening. No restricted diffusion to suggest acute infarction. No midline shift, mass effect, ventriculomegaly, extra-axial collection or acute intracranial hemorrhage. Cervicomedullary junction and pituitary are within normal limits. Underlying largely normal for age gray and white matter signal throughout the brain. No cortical encephalomalacia, although there is a chronic microhemorrhage in the left parietal lobe on series 7, image 21. No other chronic blood products identified. Vascular: Major intracranial vascular flow voids are preserved. The major dural venous sinuses are enhancing and appear to be patent. Skull and upper cervical spine: Negative  visible cervical spine. Negative visible spinal cord. Visualized bone marrow signal is within normal limits. Sinuses/Orbits: Postoperative changes to both globes, otherwise negative orbits. Trace paranasal sinus mucosal thickening. Other: Mastoids are clear. Visible internal auditory structures appear normal. Scalp and face soft tissues appear within normal limits. IMPRESSION: 1. Positive for solitary right parietal lobe brain metastasis, 6-7 mm and located at the sensory strip. Faint hemosiderin within the lesion, but no associated edema or mass effect. 2. Negative for age MRI appearance of the brain otherwise; tiny nonspecific chronic microhemorrhage in the left parietal lobe. Electronically Signed   By: Genevie Ann M.D.   On: 03/27/2019 11:59   Nm Pet Image Initial (pi) Skull Base To Thigh  Result Date: 03/27/2019 CLINICAL DATA:  Initial treatment strategy for lung cancer. Malignant pleural effusion. EXAM: NUCLEAR MEDICINE PET SKULL BASE TO THIGH TECHNIQUE: 9.57 mCi F-18 FDG was injected intravenously. Full-ring PET imaging was performed from the skull base to thigh after the radiotracer. CT data was obtained and used for attenuation correction and anatomic localization. Fasting blood glucose: 98 mg/dl COMPARISON:  Chest CT 02/09/2019 FINDINGS: Mediastinal blood pool activity: SUV max 2.58 Liver activity: SUV max NA NECK: No hypermetabolic lymph nodes in the neck. Incidental CT findings: none CHEST: Area of marked hypermetabolism and a somewhat rounded masslike lesion in the medial aspect of the right upper lobe possibly reflecting the patient's primary lung neoplasm. SUV max is 22.12. This lesion measures approximately 2.5 cm on image number 78. Second area of fairly significant hypermetabolism noted in the right lower lobe and possibly partly in the right middle lobe. This has an SUV max of 15.18. Extensive hypermetabolic pleural metastasis involving the right hemithorax. Pleural lesion anteriorly on image  number 80 measures 10 mm and SUV max is 17.2. There are also numerous small pleural nodules along the major and minor fissures. No obvious pulmonary nodules. Extensive mediastinal and hilar lymphadenopathy. 18 mm pretracheal nodal lesion on image number 81 has an SUV max of 18.74. Subcarinal lesion measures 11 mm on image number 89 and has an SUV max of 18.68. Incidental CT findings: Persistent large right pleural effusion. ABDOMEN/PELVIS: There is a vague low-attenuation lesion in the liver measuring 2 cm. This is in segment 7 and is markedly hypermetabolic. The SUV max is 19.14. I am not sure if this is an intraparenchymal metastasis or a peritoneal surface metastasis. No other liver lesions are identified. Right-sided retroperitoneal lymph node just posterior to the IVC on image number 324 is hypermetabolic with SUV max of 40.10. Small lymph node anterior to the IVC on image number 156 measures 5 mm and SUV max is 3.43. No adrenal gland lesions. Incidental CT findings: Small left-sided bladder diverticulum. Enlarged prostate gland with median lobe hypertrophy impressing on the base of the bladder. SKELETON: No findings to suggest osseous metastatic disease. Incidental CT findings: none IMPRESSION: 1. Probable right upper lobe and right lower lobe/middle lobe lung cancer  with extensive metastatic pleural disease and mediastinal and hilar disease. There is also a large malignant right pleural effusion. 2. Evidence of disease below the diaphragm with a right hepatic lobe lesion and metastatic adenopathy in the abdomen. 3. No findings suspicious for osseous metastatic disease or pulmonary metastatic disease. Electronically Signed   By: Marijo Sanes M.D.   On: 03/27/2019 10:09   Dg Chest Port 1 View  Result Date: 03/30/2019 CLINICAL DATA:  68 year old male lung cancer with malignant pleural effusion. Postoperative day 1 right video-assisted thoracoscopy. Port-A-Cath placement, pleural biopsy and pleural drainage.  EXAM: PORTABLE CHEST 1 VIEW COMPARISON:  03/29/2019 and earlier. FINDINGS: Portable AP upright view at 0632 hours. Stable right IJ central line. Stable right subclavian Port-A-Cath. Right chest tube remains in place. No pneumothorax. Stable lung volumes with mildly improved right lung base ventilation. Patchy residual infrahilar opacity. Stable cardiac size and mediastinal contours. Allowing for portable technique the left lung remains clear. Negative visible bowel gas pattern. IMPRESSION: 1. Stable lines and tubes including right chest tube. No pneumothorax. 2. Mildly improved right lung base ventilation. Patchy residual infrahilar opacity. 3. No new cardiopulmonary abnormality. Electronically Signed   By: Genevie Ann M.D.   On: 03/30/2019 09:08   Dg Chest Port 1 View  Result Date: 03/29/2019 CLINICAL DATA:  Status post VATS. Status post porta catheter placement. EXAM: PORTABLE CHEST 1 VIEW COMPARISON:  03/28/2019. FINDINGS: Interval right subclavian porta catheter with its tip in the proximal superior vena cava. Interval right jugular catheter with its tip also in the proximal superior vena cava. No pneumothorax. Normal sized heart. Decreased right pleural fluid and right basilar atelectasis. Interval mild patchy density in the right lower lung zone. Clear left lung. Unremarkable bones. IMPRESSION: 1. Right jugular catheter tip in the proximal superior vena cava without pneumothorax. 2. Right subclavian porta catheter tip in the proximal superior vena cava. 3. Decreased right pleural fluid and right basilar atelectasis. 4. Interval mild patchy atelectasis or pneumonia in the right lower lung zone. Electronically Signed   By: Claudie Revering M.D.   On: 03/29/2019 19:17   Dg Fluoro Guide Cv Line-no Report  Result Date: 03/29/2019 Fluoroscopy was utilized by the requesting physician.  No radiographic interpretation.   Ir Thoracentesis Asp Pleural Space W/img Guide  Result Date: 03/13/2019 INDICATION: Patient with  history of lung adenocarcinoma, pleural effusion. Request is made for therapeutic right thoracentesis. EXAM: ULTRASOUND GUIDED THERAPEUTIC RIGHT THORACENTESIS MEDICATIONS: 10 mL 1% lidocaine COMPLICATIONS: None immediate. PROCEDURE: An ultrasound guided thoracentesis was thoroughly discussed with the patient and questions answered. The benefits, risks, alternatives and complications were also discussed. The patient understands and wishes to proceed with the procedure. Written consent was obtained. Ultrasound was performed to localize and mark an adequate pocket of fluid in the right chest. The area was then prepped and draped in the normal sterile fashion. 1% Lidocaine was used for local anesthesia. Under ultrasound guidance a 6 Fr Safe-T-Centesis catheter was introduced. Thoracentesis was performed. The catheter was removed and a dressing applied. FINDINGS: A total of approximately 1.9 liters of amber fluid was removed. Samples were sent to the laboratory as requested by the clinical team. IMPRESSION: Successful ultrasound guided therapeutic right thoracentesis yielding 1.9 L of pleural fluid. Read by: Brynda Greathouse PA-C Electronically Signed   By: Aletta Edouard M.D.   On: 03/13/2019 11:10         Discharge Medications: Allergies as of 03/30/2019   No Known Allergies     Medication  List    TAKE these medications   cyanocobalamin 1000 MCG/ML injection Commonly known as: (VITAMIN B-12) Wife is a Marine scientist and administers injection. Next injection due 03/18/2034.   folic acid 1 MG tablet Commonly known as: FOLVITE Take 1 tablet (1 mg total) by mouth daily.   lidocaine-prilocaine cream Commonly known as: EMLA Apply to the Port-A-Cath site 30-60 minutes before treatment   prochlorperazine 10 MG tablet Commonly known as: COMPAZINE Take 1 tablet (10 mg total) by mouth every 6 (six) hours as needed for nausea or vomiting.   SUMAtriptan 50 MG tablet Commonly known as: IMITREX Take 50 mg by  mouth every 2 (two) hours as needed for migraine.   traMADol 50 MG tablet Commonly known as: ULTRAM Take 1 tablet (50 mg total) by mouth every 6 (six) hours as needed for severe pain.       Follow Up Appointments: Follow-up Information    [name], MD Follow up.   Specialty: Cardiothoracic Surgery Why: PA/LAT CXR to be taken (at [facility] which is in the same building as Dr. Leonarda Salon office) one hour prior to office appointment. Office will call with appointment date and time Contact information: [place] [address] [place] Follow up.   Why: Please drain right Pleur X catheter daily and record output; If <150 ml /drainage session x3 consecutive occasions - QOD drainage If <150 ml /drainage session x3 consecutive occasions on  QOD drainage schedule- call [facility] office  [phone]) for evaluati          Signed: Leeam Cedrone M ZimmermanPA-C 03/30/2019, 9:20 AM

## 2019-03-30 NOTE — Progress Notes (Signed)
Has armband been applied?  Yes.    Does patient have an allergy to IV contrast dye?: No.   Has patient ever received premedication for IV contrast dye?: No.   Does patient take metformin?: No.  If patient does take metformin when was the last dose: n/a  Date of lab work: 03/28/2019 BUN: 20 CR: 0.90  IV site: subclavian right, condition no redness  Has IV site been added to flowsheet?  Yes.    BP (!) 141/89 (Patient Position: Sitting)   Pulse 96   Temp 97.8 F (36.6 C) (Oral)   Resp 20   Ht 5\' 11"  (1.803 m)   Wt 192 lb 6.4 oz (87.3 kg)   SpO2 99%   BMI 26.83 kg/m

## 2019-03-31 ENCOUNTER — Encounter (HOSPITAL_COMMUNITY): Payer: Self-pay | Admitting: Thoracic Surgery (Cardiothoracic Vascular Surgery)

## 2019-03-31 ENCOUNTER — Ambulatory Visit
Admission: RE | Admit: 2019-03-31 | Discharge: 2019-03-31 | Disposition: A | Discharge status: Home / Self Care | Payer: Medicare Other | Source: Ambulatory Visit | Attending: Radiation Oncology | Admitting: Radiation Oncology

## 2019-03-31 ENCOUNTER — Telehealth: Payer: Self-pay | Admitting: Family Medicine

## 2019-03-31 DIAGNOSIS — C7931 Secondary malignant neoplasm of brain: Secondary | ICD-10-CM

## 2019-03-31 DIAGNOSIS — R030 Elevated blood-pressure reading, without diagnosis of hypertension: Secondary | ICD-10-CM | POA: Diagnosis not present

## 2019-03-31 DIAGNOSIS — G43909 Migraine, unspecified, not intractable, without status migrainosus: Secondary | ICD-10-CM | POA: Diagnosis not present

## 2019-03-31 DIAGNOSIS — I619 Nontraumatic intracerebral hemorrhage, unspecified: Secondary | ICD-10-CM | POA: Diagnosis not present

## 2019-03-31 DIAGNOSIS — C3411 Malignant neoplasm of upper lobe, right bronchus or lung: Secondary | ICD-10-CM | POA: Diagnosis not present

## 2019-03-31 DIAGNOSIS — Z1159 Encounter for screening for other viral diseases: Secondary | ICD-10-CM | POA: Diagnosis not present

## 2019-03-31 DIAGNOSIS — H33302 Unspecified retinal break, left eye: Secondary | ICD-10-CM | POA: Diagnosis not present

## 2019-03-31 DIAGNOSIS — Z79891 Long term (current) use of opiate analgesic: Secondary | ICD-10-CM | POA: Diagnosis not present

## 2019-03-31 DIAGNOSIS — Z48813 Encounter for surgical aftercare following surgery on the respiratory system: Secondary | ICD-10-CM | POA: Diagnosis not present

## 2019-03-31 DIAGNOSIS — J910 Malignant pleural effusion: Secondary | ICD-10-CM | POA: Diagnosis not present

## 2019-03-31 DIAGNOSIS — N323 Diverticulum of bladder: Secondary | ICD-10-CM | POA: Diagnosis not present

## 2019-03-31 DIAGNOSIS — G454 Transient global amnesia: Secondary | ICD-10-CM | POA: Diagnosis not present

## 2019-03-31 DIAGNOSIS — Z9181 History of falling: Secondary | ICD-10-CM | POA: Diagnosis not present

## 2019-03-31 DIAGNOSIS — H43812 Vitreous degeneration, left eye: Secondary | ICD-10-CM | POA: Diagnosis not present

## 2019-03-31 DIAGNOSIS — E538 Deficiency of other specified B group vitamins: Secondary | ICD-10-CM | POA: Diagnosis not present

## 2019-03-31 DIAGNOSIS — G629 Polyneuropathy, unspecified: Secondary | ICD-10-CM | POA: Diagnosis not present

## 2019-03-31 DIAGNOSIS — N529 Male erectile dysfunction, unspecified: Secondary | ICD-10-CM | POA: Diagnosis not present

## 2019-03-31 DIAGNOSIS — N400 Enlarged prostate without lower urinary tract symptoms: Secondary | ICD-10-CM | POA: Diagnosis not present

## 2019-03-31 DIAGNOSIS — E785 Hyperlipidemia, unspecified: Secondary | ICD-10-CM | POA: Diagnosis not present

## 2019-03-31 DIAGNOSIS — F17210 Nicotine dependence, cigarettes, uncomplicated: Secondary | ICD-10-CM | POA: Diagnosis not present

## 2019-03-31 DIAGNOSIS — C7949 Secondary malignant neoplasm of other parts of nervous system: Secondary | ICD-10-CM

## 2019-03-31 DIAGNOSIS — J9811 Atelectasis: Secondary | ICD-10-CM | POA: Diagnosis not present

## 2019-03-31 MED ORDER — GADOBENATE DIMEGLUMINE 529 MG/ML IV SOLN
18.0000 mL | Freq: Once | INTRAVENOUS | Status: AC | PRN
  Administered 2019-03-31: 18 mL via INTRAVENOUS

## 2019-03-31 NOTE — Telephone Encounter (Signed)
Yes that is okay

## 2019-03-31 NOTE — Telephone Encounter (Signed)
See note

## 2019-03-31 NOTE — Telephone Encounter (Signed)
Caller- Ethan Brown with Ethan Brown  Requesting Social Work  Freq: eval  Requesting nursing  Freq: Draining every day or every other day- [facility]

## 2019-03-31 NOTE — Telephone Encounter (Signed)
Ok for orders? 

## 2019-03-31 NOTE — Telephone Encounter (Signed)
Denise from [facility] called  Would like to know if orders are ok for her to see pt on Saturday - needs drainage   [phone]

## 2019-03-31 NOTE — Telephone Encounter (Signed)
Orders have been given

## 2019-03-31 NOTE — Telephone Encounter (Signed)
Message has been sent to Dr. Sarajane Jews. Awaiting a response.

## 2019-04-01 NOTE — Progress Notes (Signed)
  Radiation Oncology         [phone] ________________________________  Name: Ethan Brown MRN: [id number]  Date: 04/03/2019  DOB: 11-18-1950  SIMULATION AND TREATMENT PLANNING NOTE    ICD-10-CM   1. Brain metastasis ([place])  C79.31     DIAGNOSIS:  68 yo man with a solitary 6.2 mm right parietal brain metastasis from Stage IV (T2b, N2, M1c) adenocarcinoma of the right middle lobe of the lung.  NARRATIVE:  The patient was brought to the [facility].  Identity was confirmed.  All relevant records and images related to the planned course of therapy were reviewed.  The patient freely provided informed written consent to proceed with treatment after reviewing the details related to the planned course of therapy. The consent form was witnessed and verified by the simulation staff. Intravenous access was established for contrast administration. Then, the patient was set-up in a stable reproducible supine position for radiation therapy.  A relocatable thermoplastic stereotactic head frame was fabricated for precise immobilization.  CT images were obtained.  Surface markings were placed.  The CT images were loaded into the planning software and fused with the patient's targeting MRI scan.  Then the target and avoidance structures were contoured.  Treatment planning then occurred.  The radiation prescription was entered and confirmed.  I have requested 3D planning  I have requested a DVH of the following structures: Brain stem, brain, left eye, right eye, lenses, optic chiasm, target volumes, uninvolved brain, and normal tissue.    SPECIAL TREATMENT PROCEDURE:  The planned course of therapy using radiation constitutes a special treatment procedure. Special care is required in the management of this patient for the following reasons. This treatment constitutes a Special Treatment Procedure for the following reason: High dose per fraction requiring special monitoring for increased  toxicities of treatment including daily imaging.  The special nature of the planned course of radiotherapy will require increased physician supervision and oversight to ensure patient's safety with optimal treatment outcomes.  PLAN:  The patient will receive 20 Gy in 1 fraction.  ________________________________  Sheral Apley Tammi Klippel, M.D.

## 2019-04-02 ENCOUNTER — Telehealth: Payer: Self-pay

## 2019-04-02 ENCOUNTER — Encounter (HOSPITAL_COMMUNITY): Payer: Self-pay | Admitting: Thoracic Surgery (Cardiothoracic Vascular Surgery)

## 2019-04-02 DIAGNOSIS — C7931 Secondary malignant neoplasm of brain: Secondary | ICD-10-CM | POA: Diagnosis not present

## 2019-04-02 DIAGNOSIS — J910 Malignant pleural effusion: Secondary | ICD-10-CM | POA: Diagnosis not present

## 2019-04-02 DIAGNOSIS — C3411 Malignant neoplasm of upper lobe, right bronchus or lung: Secondary | ICD-10-CM | POA: Diagnosis not present

## 2019-04-02 DIAGNOSIS — N400 Enlarged prostate without lower urinary tract symptoms: Secondary | ICD-10-CM | POA: Diagnosis not present

## 2019-04-02 DIAGNOSIS — G43909 Migraine, unspecified, not intractable, without status migrainosus: Secondary | ICD-10-CM | POA: Diagnosis not present

## 2019-04-02 DIAGNOSIS — Z48813 Encounter for surgical aftercare following surgery on the respiratory system: Secondary | ICD-10-CM | POA: Diagnosis not present

## 2019-04-02 NOTE — Telephone Encounter (Signed)
Instructed pt during encounter with the [facility], their may have been exposure to an unknown GI illness. Informed that it is suspected to be a food borne illness that can be transmitted person to person, but informed we are still investigating. Pt advised if they develop nausea, vomiting, or diarrhea in the next day or two to call [phone] from 7 am to 7 pm. Pt advised if they develop severe symptoms, please seek medical attention. Pt advised they are not at high risk of getting this illness and informed pt that testing is not recommended for asymptomatic patients. Pt is asymptomatic. 

## 2019-04-03 ENCOUNTER — Inpatient Hospital Stay: Payer: Medicare Other

## 2019-04-03 ENCOUNTER — Ambulatory Visit
Admission: RE | Admit: 2019-04-03 | Discharge: 2019-04-03 | Disposition: A | Discharge status: Home / Self Care | Payer: Medicare Other | Source: Ambulatory Visit | Attending: Radiation Oncology | Admitting: Radiation Oncology

## 2019-04-03 ENCOUNTER — Other Ambulatory Visit: Payer: Self-pay

## 2019-04-03 ENCOUNTER — Encounter (HOSPITAL_COMMUNITY): Payer: Self-pay | Admitting: Thoracic Surgery (Cardiothoracic Vascular Surgery)

## 2019-04-03 VITALS — BP 141/89 | HR 96 | Temp 97.8°F | Resp 20 | Ht 71.0 in | Wt 192.4 lb

## 2019-04-03 VITALS — BP 114/89 | HR 96 | Temp 97.8°F | Resp 20 | Ht 71.0 in | Wt 192.2 lb

## 2019-04-03 DIAGNOSIS — R0602 Shortness of breath: Secondary | ICD-10-CM | POA: Insufficient documentation

## 2019-04-03 DIAGNOSIS — Z510 Encounter for antineoplastic radiation therapy: Secondary | ICD-10-CM | POA: Insufficient documentation

## 2019-04-03 DIAGNOSIS — G454 Transient global amnesia: Secondary | ICD-10-CM | POA: Diagnosis not present

## 2019-04-03 DIAGNOSIS — C7949 Secondary malignant neoplasm of other parts of nervous system: Secondary | ICD-10-CM

## 2019-04-03 DIAGNOSIS — J910 Malignant pleural effusion: Secondary | ICD-10-CM | POA: Diagnosis not present

## 2019-04-03 DIAGNOSIS — C3490 Malignant neoplasm of unspecified part of unspecified bronchus or lung: Secondary | ICD-10-CM | POA: Insufficient documentation

## 2019-04-03 DIAGNOSIS — G43909 Migraine, unspecified, not intractable, without status migrainosus: Secondary | ICD-10-CM | POA: Diagnosis not present

## 2019-04-03 DIAGNOSIS — C3411 Malignant neoplasm of upper lobe, right bronchus or lung: Secondary | ICD-10-CM | POA: Diagnosis not present

## 2019-04-03 DIAGNOSIS — N529 Male erectile dysfunction, unspecified: Secondary | ICD-10-CM | POA: Diagnosis not present

## 2019-04-03 DIAGNOSIS — Z8042 Family history of malignant neoplasm of prostate: Secondary | ICD-10-CM | POA: Diagnosis not present

## 2019-04-03 DIAGNOSIS — C7931 Secondary malignant neoplasm of brain: Secondary | ICD-10-CM | POA: Insufficient documentation

## 2019-04-03 DIAGNOSIS — N400 Enlarged prostate without lower urinary tract symptoms: Secondary | ICD-10-CM | POA: Diagnosis not present

## 2019-04-03 DIAGNOSIS — Z48813 Encounter for surgical aftercare following surgery on the respiratory system: Secondary | ICD-10-CM | POA: Diagnosis not present

## 2019-04-03 MED ORDER — SODIUM CHLORIDE 0.9% FLUSH
10.0000 mL | INTRAVENOUS | Status: AC
  Administered 2019-04-03: 09:00:00 10 mL via INTRAVENOUS

## 2019-04-03 MED ORDER — HEPARIN SOD (PORK) LOCK FLUSH 100 UNIT/ML IV SOLN
500.0000 [IU] | Freq: Once | INTRAVENOUS | Status: AC
  Administered 2019-04-03: 09:00:00 500 [IU] via INTRAVENOUS

## 2019-04-03 NOTE — Progress Notes (Signed)
CT simulation complete. Flushed port per protocol. Removed access needle. Needle intact upon removal. Applied a bandaid to old access site. Patient tolerated this well.

## 2019-04-04 ENCOUNTER — Inpatient Hospital Stay (HOSPITAL_BASED_OUTPATIENT_CLINIC_OR_DEPARTMENT_OTHER): Payer: Medicare Other | Admitting: Medical

## 2019-04-04 ENCOUNTER — Inpatient Hospital Stay: Payer: Medicare Other

## 2019-04-04 ENCOUNTER — Other Ambulatory Visit: Payer: Self-pay

## 2019-04-04 VITALS — BP 122/74 | HR 79 | Temp 97.8°F | Resp 18

## 2019-04-04 DIAGNOSIS — R0602 Shortness of breath: Secondary | ICD-10-CM | POA: Diagnosis not present

## 2019-04-04 DIAGNOSIS — Z95828 Presence of other vascular implants and grafts: Secondary | ICD-10-CM

## 2019-04-04 DIAGNOSIS — J90 Pleural effusion, not elsewhere classified: Secondary | ICD-10-CM

## 2019-04-04 DIAGNOSIS — C3491 Malignant neoplasm of unspecified part of right bronchus or lung: Secondary | ICD-10-CM

## 2019-04-04 DIAGNOSIS — J910 Malignant pleural effusion: Secondary | ICD-10-CM | POA: Diagnosis not present

## 2019-04-04 DIAGNOSIS — Z5111 Encounter for antineoplastic chemotherapy: Secondary | ICD-10-CM | POA: Diagnosis not present

## 2019-04-04 DIAGNOSIS — C782 Secondary malignant neoplasm of pleura: Secondary | ICD-10-CM | POA: Diagnosis not present

## 2019-04-04 DIAGNOSIS — C3411 Malignant neoplasm of upper lobe, right bronchus or lung: Secondary | ICD-10-CM | POA: Diagnosis not present

## 2019-04-04 DIAGNOSIS — C3490 Malignant neoplasm of unspecified part of unspecified bronchus or lung: Secondary | ICD-10-CM | POA: Diagnosis not present

## 2019-04-04 DIAGNOSIS — R5382 Chronic fatigue, unspecified: Secondary | ICD-10-CM

## 2019-04-04 DIAGNOSIS — N400 Enlarged prostate without lower urinary tract symptoms: Secondary | ICD-10-CM | POA: Diagnosis not present

## 2019-04-04 DIAGNOSIS — Z79899 Other long term (current) drug therapy: Secondary | ICD-10-CM | POA: Diagnosis not present

## 2019-04-04 DIAGNOSIS — C7931 Secondary malignant neoplasm of brain: Secondary | ICD-10-CM | POA: Diagnosis not present

## 2019-04-04 DIAGNOSIS — C787 Secondary malignant neoplasm of liver and intrahepatic bile duct: Secondary | ICD-10-CM | POA: Diagnosis not present

## 2019-04-04 DIAGNOSIS — G43909 Migraine, unspecified, not intractable, without status migrainosus: Secondary | ICD-10-CM | POA: Diagnosis not present

## 2019-04-04 DIAGNOSIS — Z510 Encounter for antineoplastic radiation therapy: Secondary | ICD-10-CM | POA: Diagnosis not present

## 2019-04-04 DIAGNOSIS — N529 Male erectile dysfunction, unspecified: Secondary | ICD-10-CM | POA: Diagnosis not present

## 2019-04-04 DIAGNOSIS — Z48813 Encounter for surgical aftercare following surgery on the respiratory system: Secondary | ICD-10-CM | POA: Diagnosis not present

## 2019-04-04 DIAGNOSIS — G454 Transient global amnesia: Secondary | ICD-10-CM | POA: Diagnosis not present

## 2019-04-04 LAB — CMP (CANCER CENTER ONLY)
ALT: 19 U/L (ref 0–44)
AST: 17 U/L (ref 15–41)
Albumin: 3.1 g/dL — ABNORMAL LOW (ref 3.5–5.0)
Alkaline Phosphatase: 54 U/L (ref 38–126)
Anion gap: 8 (ref 5–15)
BUN: 23 mg/dL (ref 8–23)
CO2: 26 mmol/L (ref 22–32)
Calcium: 8.6 mg/dL — ABNORMAL LOW (ref 8.9–10.3)
Chloride: 106 mmol/L (ref 98–111)
Creatinine: 0.95 mg/dL (ref 0.61–1.24)
GFR, Est AFR Am: >60 mL/min (ref >60)
GFR, Estimated: >60 mL/min (ref >60)
Glucose, Bld: 98 mg/dL (ref 70–99)
Potassium: 4 mmol/L (ref 3.5–5.1)
Sodium: 140 mmol/L (ref 135–145)
Total Bilirubin: 0.6 mg/dL (ref 0.3–1.2)
Total Protein: 6.6 g/dL (ref 6.5–8.1)

## 2019-04-04 LAB — CBC WITH DIFFERENTIAL (CANCER CENTER ONLY)
Abs Immature Granulocytes: 0.02 10*3/uL (ref 0.00–0.07)
Basophils Absolute: 0 10*3/uL (ref 0.0–0.1)
Basophils Relative: 1 %
Eosinophils Absolute: 0.1 10*3/uL (ref 0.0–0.5)
Eosinophils Relative: 2 %
HCT: 42.8 % (ref 39.0–52.0)
Hemoglobin: 14.3 g/dL (ref 13.0–17.0)
Immature Granulocytes: 0 %
Lymphocytes Relative: 16 %
Lymphs Abs: 1 10*3/uL (ref 0.7–4.0)
MCH: 31.5 pg (ref 26.0–34.0)
MCHC: 33.4 g/dL (ref 30.0–36.0)
MCV: 94.3 fL (ref 80.0–100.0)
Monocytes Absolute: 0.4 10*3/uL (ref 0.1–1.0)
Monocytes Relative: 7 %
Neutro Abs: 4.4 10*3/uL (ref 1.7–7.7)
Neutrophils Relative %: 74 %
Platelet Count: 239 10*3/uL (ref 150–400)
RBC: 4.54 MIL/uL (ref 4.22–5.81)
RDW: 12.1 % (ref 11.5–15.5)
WBC Count: 5.9 10*3/uL (ref 4.0–10.5)
nRBC: 0 % (ref 0.0–0.2)

## 2019-04-04 LAB — TSH: TSH: 0.873 u[IU]/mL (ref 0.320–4.118)

## 2019-04-04 MED ORDER — SODIUM CHLORIDE 0.9% FLUSH
10.0000 mL | INTRAVENOUS | Status: DC | PRN
  Administered 2019-04-04: 10 mL
  Filled 2019-04-04: qty 10

## 2019-04-04 MED ORDER — SODIUM CHLORIDE 0.9 % IV SOLN
475.0000 mg/m2 | Freq: Once | INTRAVENOUS | Status: AC
  Administered 2019-04-04: 1000 mg via INTRAVENOUS
  Filled 2019-04-04: qty 40

## 2019-04-04 MED ORDER — SODIUM CHLORIDE 0.9% FLUSH
10.0000 mL | INTRAVENOUS | Status: DC | PRN
  Administered 2019-04-04: 10 mL via INTRAVENOUS
  Filled 2019-04-04: qty 10

## 2019-04-04 MED ORDER — SODIUM CHLORIDE 0.9 % IV SOLN
Freq: Once | INTRAVENOUS | Status: AC
  Administered 2019-04-04: 10:00:00 via INTRAVENOUS
  Filled 2019-04-04: qty 250

## 2019-04-04 MED ORDER — SODIUM CHLORIDE 0.9 % IV SOLN
527.0000 mg | Freq: Once | INTRAVENOUS | Status: AC
  Administered 2019-04-04: 530 mg via INTRAVENOUS
  Filled 2019-04-04: qty 53

## 2019-04-04 MED ORDER — HEPARIN SOD (PORK) LOCK FLUSH 100 UNIT/ML IV SOLN
500.0000 [IU] | Freq: Once | INTRAVENOUS | Status: AC | PRN
  Administered 2019-04-04: 500 [IU]
  Filled 2019-04-04: qty 5

## 2019-04-04 MED ORDER — PALONOSETRON HCL INJECTION 0.25 MG/5ML
0.2500 mg | Freq: Once | INTRAVENOUS | Status: AC
  Administered 2019-04-04: 0.25 mg via INTRAVENOUS

## 2019-04-04 MED ORDER — PALONOSETRON HCL INJECTION 0.25 MG/5ML
INTRAVENOUS | Status: AC
  Filled 2019-04-04: qty 5

## 2019-04-04 MED ORDER — SODIUM CHLORIDE 0.9 % IV SOLN
Freq: Once | INTRAVENOUS | Status: AC
  Administered 2019-04-04: 10:00:00 via INTRAVENOUS
  Filled 2019-04-04: qty 5

## 2019-04-04 MED ORDER — SODIUM CHLORIDE 0.9 % IV SOLN
200.0000 mg | Freq: Once | INTRAVENOUS | Status: AC
  Administered 2019-04-04: 200 mg via INTRAVENOUS
  Filled 2019-04-04: qty 8

## 2019-04-04 NOTE — Patient Instructions (Signed)

## 2019-04-04 NOTE — Progress Notes (Signed)
The patient was seen in infusion today as he was receiving his first cycle of Keytruda, Alimta, and carboplatin.  He reports having some episodic pain at site of his Pleurx catheter.  He reports that he has had pain at that site which is usually been around the time that he has drained his pleural effusion.  He denies any other issues of concern today.  He simply stated that he wanted to bring this to someone's attention.  No intervention was indicated.  Sandi Mealy, MHS, PA-C Physician Assistant

## 2019-04-04 NOTE — Patient Instructions (Signed)
[facility] Discharge Instructions for Patients Receiving Chemotherapy  Today you received the following chemotherapy agents Pembrolizumab (KEYTRUDA), Pemetrexed (ALIMTA) & Carboplatin (PARAPLATIN).  To help prevent nausea and vomiting after your treatment, we encourage you to take your nausea medication as prescribed.   If you develop nausea and vomiting that is not controlled by your nausea medication, call the clinic.   BELOW ARE SYMPTOMS THAT SHOULD BE REPORTED IMMEDIATELY:  *FEVER GREATER THAN 100.5 F  *CHILLS WITH OR WITHOUT FEVER  NAUSEA AND VOMITING THAT IS NOT CONTROLLED WITH YOUR NAUSEA MEDICATION  *UNUSUAL SHORTNESS OF BREATH  *UNUSUAL BRUISING OR BLEEDING  TENDERNESS IN MOUTH AND THROAT WITH OR WITHOUT PRESENCE OF ULCERS  *URINARY PROBLEMS  *BOWEL PROBLEMS  UNUSUAL RASH Items with * indicate a potential emergency and should be followed up as soon as possible.  Feel free to call the clinic should you have any questions or concerns. The clinic phone number is [phone].  Please show the [place] at check-in to the Emergency Department and triage nurse.  Pembrolizumab injection What is this medicine? PEMBROLIZUMAB (pem broe liz ue mab) is a monoclonal antibody. It is used to treat bladder cancer, cervical cancer, endometrial cancer, esophageal cancer, head and neck cancer, hepatocellular cancer, Hodgkin lymphoma, kidney cancer, lymphoma, melanoma, Merkel cell carcinoma, lung cancer, stomach cancer, urothelial cancer, and cancers that have a certain genetic condition. This medicine may be used for other purposes; ask your health care provider or pharmacist if you have questions. COMMON BRAND NAME(S): Keytruda What should I tell my health care provider before I take this medicine? They need to know if you have any of these conditions:  diabetes  immune system problems  inflammatory bowel disease  liver disease  lung or breathing  disease  lupus  received or scheduled to receive an organ transplant or a stem-cell transplant that uses donor stem cells  an unusual or allergic reaction to pembrolizumab, other medicines, foods, dyes, or preservatives  pregnant or trying to get pregnant  breast-feeding How should I use this medicine? This medicine is for infusion into a vein. It is given by a health care professional in a hospital or clinic setting. A special MedGuide will be given to you before each treatment. Be sure to read this information carefully each time. Talk to your pediatrician regarding the use of this medicine in children. While this drug may be prescribed for selected conditions, precautions do apply. Overdosage: If you think you have taken too much of this medicine contact a poison control center or emergency room at once. NOTE: This medicine is only for you. Do not share this medicine with others. What if I miss a dose? It is important not to miss your dose. Call your doctor or health care professional if you are unable to keep an appointment. What may interact with this medicine? Interactions have not been studied. Give your health care provider a list of all the medicines, herbs, non-prescription drugs, or dietary supplements you use. Also tell them if you smoke, drink alcohol, or use illegal drugs. Some items may interact with your medicine. This list may not describe all possible interactions. Give your health care provider a list of all the medicines, herbs, non-prescription drugs, or dietary supplements you use. Also tell them if you smoke, drink alcohol, or use illegal drugs. Some items may interact with your medicine. What should I watch for while using this medicine? Your condition will be monitored carefully while you are  receiving this medicine. You may need blood work done while you are taking this medicine. Do not become pregnant while taking this medicine or for 4 months after stopping it.  Women should inform their doctor if they wish to become pregnant or think they might be pregnant. There is a potential for serious side effects to an unborn child. Talk to your health care professional or pharmacist for more information. Do not breast-feed an infant while taking this medicine or for 4 months after the last dose. What side effects may I notice from receiving this medicine? Side effects that you should report to your doctor or health care professional as soon as possible:  allergic reactions like skin rash, itching or hives, swelling of the face, lips, or tongue  bloody or black, tarry  breathing problems  changes in vision  chest pain  chills  confusion  constipation  cough  diarrhea  dizziness or feeling faint or lightheaded  fast or irregular heartbeat  fever  flushing  hair loss  joint pain  low blood counts - this medicine may decrease the number of white blood cells, red blood cells and platelets. You may be at increased risk for infections and bleeding.  muscle pain  muscle weakness  persistent headache  redness, blistering, peeling or loosening of the skin, including inside the mouth  signs and symptoms of high blood sugar such as dizziness; dry mouth; dry skin; fruity breath; nausea; stomach pain; increased hunger or thirst; increased urination  signs and symptoms of kidney injury like trouble passing urine or change in the amount of urine  signs and symptoms of liver injury like dark urine, light-colored stools, loss of appetite, nausea, right upper belly pain, yellowing of the eyes or skin  sweating  swollen lymph nodes  weight loss Side effects that usually do not require medical attention (report to your doctor or health care professional if they continue or are bothersome):  decreased appetite  muscle pain  tiredness This list may not describe all possible side effects. Call your doctor for medical advice about side effects.  You may report side effects to FDA at 1-800-FDA-1088. Where should I keep my medicine? This drug is given in a hospital or clinic and will not be stored at home. NOTE: This sheet is a summary. It may not cover all possible information. If you have questions about this medicine, talk to your doctor, pharmacist, or health care provider.  2020 Elsevier/Gold Standard (2018-09-06 13:46:58)  Pemetrexed injection What is this medicine? PEMETREXED (PEM e TREX ed) is a chemotherapy drug used to treat lung cancers like non-small cell lung cancer and mesothelioma. It may also be used to treat other cancers. This medicine may be used for other purposes; ask your health care provider or pharmacist if you have questions. COMMON BRAND NAME(S): Alimta What should I tell my health care provider before I take this medicine? They need to know if you have any of these conditions:  infection (especially a virus infection such as chickenpox, cold sores, or herpes)  kidney disease  low blood counts, like low white cell, platelet, or red cell counts  lung or breathing disease, like asthma  radiation therapy  an unusual or allergic reaction to pemetrexed, other medicines, foods, dyes, or preservative  pregnant or trying to get pregnant  breast-feeding How should I use this medicine? This drug is given as an infusion into a vein. It is administered in a hospital or clinic by a specially trained health  care professional. Talk to your pediatrician regarding the use of this medicine in children. Special care may be needed. Overdosage: If you think you have taken too much of this medicine contact a poison control center or emergency room at once. NOTE: This medicine is only for you. Do not share this medicine with others. What if I miss a dose? It is important not to miss your dose. Call your doctor or health care professional if you are unable to keep an appointment. What may interact with this  medicine? This medicine may interact with the following medications:  Ibuprofen This list may not describe all possible interactions. Give your health care provider a list of all the medicines, herbs, non-prescription drugs, or dietary supplements you use. Also tell them if you smoke, drink alcohol, or use illegal drugs. Some items may interact with your medicine. What should I watch for while using this medicine? Visit your doctor for checks on your progress. This drug may make you feel generally unwell. This is not uncommon, as chemotherapy can affect healthy cells as well as cancer cells. Report any side effects. Continue your course of treatment even though you feel ill unless your doctor tells you to stop. In some cases, you may be given additional medicines to help with side effects. Follow all directions for their use. Call your doctor or health care professional for advice if you get a fever, chills or sore throat, or other symptoms of a cold or flu. Do not treat yourself. This drug decreases your body's ability to fight infections. Try to avoid being around people who are sick. This medicine may increase your risk to bruise or bleed. Call your doctor or health care professional if you notice any unusual bleeding. Be careful brushing and flossing your teeth or using a toothpick because you may get an infection or bleed more easily. If you have any dental work done, tell your dentist you are receiving this medicine. Avoid taking products that contain aspirin, acetaminophen, ibuprofen, naproxen, or ketoprofen unless instructed by your doctor. These medicines may hide a fever. Call your doctor or health care professional if you get diarrhea or mouth sores. Do not treat yourself. To protect your kidneys, drink water or other fluids as directed while you are taking this medicine. Do not become pregnant while taking this medicine or for 6 months after stopping it. Women should inform their doctor if  they wish to become pregnant or think they might be pregnant. Men should not father a child while taking this medicine and for 3 months after stopping it. This may interfere with the ability to father a child. You should talk to your doctor or health care professional if you are concerned about your fertility. There is a potential for serious side effects to an unborn child. Talk to your health care professional or pharmacist for more information. Do not breast-feed an infant while taking this medicine or for 1 week after stopping it. What side effects may I notice from receiving this medicine? Side effects that you should report to your doctor or health care professional as soon as possible:  allergic reactions like skin rash, itching or hives, swelling of the face, lips, or tongue  breathing problems  redness, blistering, peeling or loosening of the skin, including inside the mouth  signs and symptoms of bleeding such as bloody or black, tarry stools; red or dark-brown urine; spitting up blood or brown material that looks like coffee grounds; red spots on the skin;  unusual bruising or bleeding from the eye, gums, or nose  signs and symptoms of infection like fever or chills; cough; sore throat; pain or trouble passing urine  signs and symptoms of kidney injury like trouble passing urine or change in the amount of urine  signs and symptoms of liver injury like dark yellow or brown urine; general ill feeling or flu-like symptoms; light-colored stools; loss of appetite; nausea; right upper belly pain; unusually weak or tired; yellowing of the eyes or skin Side effects that usually do not require medical attention (report to your doctor or health care professional if they continue or are bothersome):  constipation  mouth sores  nausea, vomiting  unusually weak or tired This list may not describe all possible side effects. Call your doctor for medical advice about side effects. You may report  side effects to FDA at 1-800-FDA-1088. Where should I keep my medicine? This drug is given in a hospital or clinic and will not be stored at home. NOTE: This sheet is a summary. It may not cover all possible information. If you have questions about this medicine, talk to your doctor, pharmacist, or health care provider.  2020 Elsevier/Gold Standard (2017-09-29 16:11:33)  Carboplatin injection What is this medicine? CARBOPLATIN (KAR boe pla tin) is a chemotherapy drug. It targets fast dividing cells, like cancer cells, and causes these cells to die. This medicine is used to treat ovarian cancer and many other cancers. This medicine may be used for other purposes; ask your health care provider or pharmacist if you have questions. COMMON BRAND NAME(S): Paraplatin What should I tell my health care provider before I take this medicine? They need to know if you have any of these conditions:  blood disorders  hearing problems  kidney disease  recent or ongoing radiation therapy  an unusual or allergic reaction to carboplatin, cisplatin, other chemotherapy, other medicines, foods, dyes, or preservatives  pregnant or trying to get pregnant  breast-feeding How should I use this medicine? This drug is usually given as an infusion into a vein. It is administered in a hospital or clinic by a specially trained health care professional. Talk to your pediatrician regarding the use of this medicine in children. Special care may be needed. Overdosage: If you think you have taken too much of this medicine contact a poison control center or emergency room at once. NOTE: This medicine is only for you. Do not share this medicine with others. What if I miss a dose? It is important not to miss a dose. Call your doctor or health care professional if you are unable to keep an appointment. What may interact with this medicine?  medicines for seizures  medicines to increase blood counts like filgrastim,  pegfilgrastim, sargramostim  some antibiotics like amikacin, gentamicin, neomycin, streptomycin, tobramycin  vaccines Talk to your doctor or health care professional before taking any of these medicines:  acetaminophen  aspirin  ibuprofen  ketoprofen  naproxen This list may not describe all possible interactions. Give your health care provider a list of all the medicines, herbs, non-prescription drugs, or dietary supplements you use. Also tell them if you smoke, drink alcohol, or use illegal drugs. Some items may interact with your medicine. What should I watch for while using this medicine? Your condition will be monitored carefully while you are receiving this medicine. You will need important blood work done while you are taking this medicine. This drug may make you feel generally unwell. This is not uncommon, as chemotherapy  can affect healthy cells as well as cancer cells. Report any side effects. Continue your course of treatment even though you feel ill unless your doctor tells you to stop. In some cases, you may be given additional medicines to help with side effects. Follow all directions for their use. Call your doctor or health care professional for advice if you get a fever, chills or sore throat, or other symptoms of a cold or flu. Do not treat yourself. This drug decreases your body's ability to fight infections. Try to avoid being around people who are sick. This medicine may increase your risk to bruise or bleed. Call your doctor or health care professional if you notice any unusual bleeding. Be careful brushing and flossing your teeth or using a toothpick because you may get an infection or bleed more easily. If you have any dental work done, tell your dentist you are receiving this medicine. Avoid taking products that contain aspirin, acetaminophen, ibuprofen, naproxen, or ketoprofen unless instructed by your doctor. These medicines may hide a fever. Do not become pregnant  while taking this medicine. Women should inform their doctor if they wish to become pregnant or think they might be pregnant. There is a potential for serious side effects to an unborn child. Talk to your health care professional or pharmacist for more information. Do not breast-feed an infant while taking this medicine. What side effects may I notice from receiving this medicine? Side effects that you should report to your doctor or health care professional as soon as possible:  allergic reactions like skin rash, itching or hives, swelling of the face, lips, or tongue  signs of infection - fever or chills, cough, sore throat, pain or difficulty passing urine  signs of decreased platelets or bleeding - bruising, pinpoint red spots on the skin, black, tarry stools, nosebleeds  signs of decreased red blood cells - unusually weak or tired, fainting spells, lightheadedness  breathing problems  changes in hearing  changes in vision  chest pain  high blood pressure  low blood counts - This drug may decrease the number of white blood cells, red blood cells and platelets. You may be at increased risk for infections and bleeding.  nausea and vomiting  pain, swelling, redness or irritation at the injection site  pain, tingling, numbness in the hands or feet  problems with balance, talking, walking  trouble passing urine or change in the amount of urine Side effects that usually do not require medical attention (report to your doctor or health care professional if they continue or are bothersome):  hair loss  loss of appetite  metallic taste in the mouth or changes in taste This list may not describe all possible side effects. Call your doctor for medical advice about side effects. You may report side effects to FDA at 1-800-FDA-1088. Where should I keep my medicine? This drug is given in a hospital or clinic and will not be stored at home. NOTE: This sheet is a summary. It may not  cover all possible information. If you have questions about this medicine, talk to your doctor, pharmacist, or health care provider.  2020 Elsevier/Gold Standard (2007-11-15 14:38:05)  Coronavirus (COVID-19) Are you at risk?  Are you at risk for the Coronavirus (COVID-19)?  To be considered HIGH RISK for Coronavirus (COVID-19), you have to meet the following criteria:  . Traveled to [place], [place], [place], [place] or [place]; or in the [place] to [place], [place], [place], or [place];  and have fever, cough, and shortness of breath within the last 2 weeks of travel OR . Been in close contact with a person diagnosed with COVID-19 within the last 2 weeks and have fever, cough, and shortness of breath . IF YOU DO NOT MEET THESE CRITERIA, YOU ARE CONSIDERED LOW RISK FOR COVID-19.  What to do if you are HIGH RISK for COVID-19?  Marland Kitchen If you are having a medical emergency, call 911. . Seek medical care right away. Before you go to a doctor's office, urgent care or emergency department, call ahead and tell them about your recent travel, contact with someone diagnosed with COVID-19, and your symptoms. You should receive instructions from your physician's office regarding next steps of care.  . When you arrive at healthcare provider, tell the healthcare staff immediately you have returned from visiting [place], [place], [place], [place] or [place]; or traveled in the [place] to [place], [place], [place], or [place]; in the last two weeks or you have been in close contact with a person diagnosed with COVID-19 in the last 2 weeks.   . Tell the health care staff about your symptoms: fever, cough and shortness of breath. . After you have been seen by a medical provider, you will be either: o Tested for (COVID-19) and discharged home on quarantine except to seek medical care if symptoms worsen, and asked to  - Stay home and avoid contact with others until you get your results (4-5 days)   - Avoid travel on public transportation if possible (such as bus, train, or airplane) or o Sent to the Emergency Department by EMS for evaluation, COVID-19 testing, and possible admission depending on your condition and test results.  What to do if you are LOW RISK for COVID-19?  Reduce your risk of any infection by using the same precautions used for avoiding the common cold or flu:  Marland Kitchen Wash your hands often with soap and warm water for at least 20 seconds.  If soap and water are not readily available, use an alcohol-based hand sanitizer with at least 60% alcohol.  . If coughing or sneezing, cover your mouth and nose by coughing or sneezing into the elbow areas of your shirt or coat, into a tissue or into your sleeve (not your hands). . Avoid shaking hands with others and consider head nods or verbal greetings only. . Avoid touching your eyes, nose, or mouth with unwashed hands.  . Avoid close contact with people who are sick. . Avoid places or events with large numbers of people in one location, like concerts or sporting events. . Carefully consider travel plans you have or are making. . If you are planning any travel outside or inside the [place], visit the CDC's Travelers' Health webpage for the latest health notices. . If you have some symptoms but not all symptoms, continue to monitor at home and seek medical attention if your symptoms worsen. . If you are having a medical emergency, call 911.   [place] / e-Visit: [email or web address]         [facility]: [facility]: [phone]                   [facility]: [phone]

## 2019-04-05 ENCOUNTER — Telehealth: Payer: Self-pay | Admitting: *Deleted

## 2019-04-05 ENCOUNTER — Encounter: Payer: Self-pay | Admitting: Internal Medicine

## 2019-04-05 NOTE — Progress Notes (Signed)
Called patient to introduce myself as Arboriculturist and to offer available resources. He was given my card at his recent visit and had planning on contacting me later.  Discussed the one-time $36 [place] and qualifications to assist with personal expenses while going through treatment. Based on guidelines, he is over income.  His main concern was OOP amount for treatment. Advised him with having two insurances, there is nothing available until his insurance have paid their portions. Advised if there is an amount remaining for him to pay and he has concerns, to reach out to me at that time to discuss what may be available.He verbalized understanding and was very appreciative of the call.   He has my card for any additional financial questions or concerns.

## 2019-04-06 DIAGNOSIS — Z48813 Encounter for surgical aftercare following surgery on the respiratory system: Secondary | ICD-10-CM | POA: Diagnosis not present

## 2019-04-06 DIAGNOSIS — N400 Enlarged prostate without lower urinary tract symptoms: Secondary | ICD-10-CM | POA: Diagnosis not present

## 2019-04-06 DIAGNOSIS — C7931 Secondary malignant neoplasm of brain: Secondary | ICD-10-CM | POA: Diagnosis not present

## 2019-04-06 DIAGNOSIS — J910 Malignant pleural effusion: Secondary | ICD-10-CM | POA: Diagnosis not present

## 2019-04-06 DIAGNOSIS — G43909 Migraine, unspecified, not intractable, without status migrainosus: Secondary | ICD-10-CM | POA: Diagnosis not present

## 2019-04-06 DIAGNOSIS — R030 Elevated blood-pressure reading, without diagnosis of hypertension: Secondary | ICD-10-CM | POA: Diagnosis not present

## 2019-04-06 DIAGNOSIS — Z6827 Body mass index (BMI) 27.0-27.9, adult: Secondary | ICD-10-CM | POA: Diagnosis not present

## 2019-04-06 DIAGNOSIS — C3411 Malignant neoplasm of upper lobe, right bronchus or lung: Secondary | ICD-10-CM | POA: Diagnosis not present

## 2019-04-07 ENCOUNTER — Ambulatory Visit
Admission: RE | Admit: 2019-04-07 | Discharge: 2019-04-07 | Disposition: A | Discharge status: Home / Self Care | Payer: Medicare Other | Source: Ambulatory Visit | Attending: Radiation Oncology | Admitting: Radiation Oncology

## 2019-04-07 ENCOUNTER — Other Ambulatory Visit: Payer: Self-pay

## 2019-04-07 ENCOUNTER — Encounter: Payer: Self-pay | Admitting: Radiation Oncology

## 2019-04-07 VITALS — BP 121/81 | HR 83 | Temp 98.5°F | Resp 18

## 2019-04-07 DIAGNOSIS — C7931 Secondary malignant neoplasm of brain: Secondary | ICD-10-CM | POA: Insufficient documentation

## 2019-04-07 DIAGNOSIS — G454 Transient global amnesia: Secondary | ICD-10-CM | POA: Diagnosis not present

## 2019-04-07 DIAGNOSIS — R0602 Shortness of breath: Secondary | ICD-10-CM | POA: Diagnosis not present

## 2019-04-07 DIAGNOSIS — C3490 Malignant neoplasm of unspecified part of unspecified bronchus or lung: Secondary | ICD-10-CM | POA: Diagnosis not present

## 2019-04-07 DIAGNOSIS — Z510 Encounter for antineoplastic radiation therapy: Secondary | ICD-10-CM | POA: Diagnosis not present

## 2019-04-07 DIAGNOSIS — N529 Male erectile dysfunction, unspecified: Secondary | ICD-10-CM | POA: Diagnosis not present

## 2019-04-07 NOTE — Addendum Note (Signed)
Addended by: Pincus Large on: 04/07/2019 11:29 AM   Modules accepted: Orders

## 2019-04-07 NOTE — Progress Notes (Signed)
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 Patient alert and oriented x 3. Patient denies pain. Patient denies any new neurologic symptoms. Instructed patient to avoid strenuous activity for the next 24 hours. Instructed patient to phone [phone] and request the radiation oncologist on call with any needs related to today's treatment. Patient verbalized understanding of all reviewed. Patient discharged home and walked to lobby at 1425. Steady gait noted. No distress noted at discharge.   Temperature: 98.5 Pulse: 83 Respirations: 18 Blood pressure: 121/81 Oxygen saturation: 99%

## 2019-04-08 ENCOUNTER — Ambulatory Visit (HOSPITAL_COMMUNITY): Payer: Medicare Other

## 2019-04-09 NOTE — Progress Notes (Signed)
  Radiation Oncology         (336) [phone] ________________________________  Stereotactic Treatment Procedure Note  Name: Ethan Brown MRN: [id number]  Date: 04/07/2019  DOB: Jun 28, 1951  SPECIAL TREATMENT PROCEDURE    ICD-10-CM   1. Brain metastasis ([place])  C79.31     3D TREATMENT PLANNING AND DOSIMETRY:  The patient's radiation plan was reviewed and approved by neurosurgery and radiation oncology prior to treatment.  It showed 3-dimensional radiation distributions overlaid onto the planning CT/MRI image set.  The [facility] for the target structures as well as the organs at risk were reviewed. The documentation of the 3D plan and dosimetry are filed in the radiation oncology EMR.  NARRATIVE:  Ethan Brown was brought to the TrueBeam stereotactic radiation treatment machine and placed supine on the CT couch. The head frame was applied, and the patient was set up for stereotactic radiosurgery.  Neurosurgery was present for the set-up and delivery  SIMULATION VERIFICATION:  In the couch zero-angle position, the patient underwent Exactrac imaging using the Brainlab system with orthogonal KV images.  These were carefully aligned and repeated to confirm treatment position for each of the isocenters.  The Exactrac snap film verification was repeated at each couch angle.  PROCEDURE: Ethan Brown received stereotactic radiosurgery to the following targets: Right parietal 6 mm target was treated using 3 Dynamic Conformal Arcs to a prescription dose of 20 Gy.  ExacTrac registration was performed for each couch angle.  The 100% isodose line was prescribed.  6 MV X-rays were delivered in the flattening filter free beam mode.   STEREOTACTIC TREATMENT MANAGEMENT:  Following delivery, the patient was transported to nursing in stable condition and monitored for possible acute effects.  Vital signs were recorded BP 121/81   Pulse 83   Temp 98.5 F (36.9 C)   Resp 18 . The patient  tolerated treatment without significant acute effects, and was discharged to home in stable condition.    PLAN: Follow-up in one month.  ________________________________  Sheral Apley. Tammi Klippel, M.D.

## 2019-04-10 DIAGNOSIS — C3411 Malignant neoplasm of upper lobe, right bronchus or lung: Secondary | ICD-10-CM | POA: Diagnosis not present

## 2019-04-10 DIAGNOSIS — G43909 Migraine, unspecified, not intractable, without status migrainosus: Secondary | ICD-10-CM | POA: Diagnosis not present

## 2019-04-10 DIAGNOSIS — C7931 Secondary malignant neoplasm of brain: Secondary | ICD-10-CM | POA: Diagnosis not present

## 2019-04-10 DIAGNOSIS — J910 Malignant pleural effusion: Secondary | ICD-10-CM | POA: Diagnosis not present

## 2019-04-10 DIAGNOSIS — N400 Enlarged prostate without lower urinary tract symptoms: Secondary | ICD-10-CM | POA: Diagnosis not present

## 2019-04-10 DIAGNOSIS — Z48813 Encounter for surgical aftercare following surgery on the respiratory system: Secondary | ICD-10-CM | POA: Diagnosis not present

## 2019-04-11 ENCOUNTER — Inpatient Hospital Stay: Payer: Medicare Other

## 2019-04-11 ENCOUNTER — Other Ambulatory Visit: Payer: Self-pay

## 2019-04-11 ENCOUNTER — Encounter: Payer: Self-pay | Admitting: *Deleted

## 2019-04-11 ENCOUNTER — Telehealth: Payer: Self-pay | Admitting: Physician Assistant

## 2019-04-11 ENCOUNTER — Encounter: Payer: Self-pay | Admitting: Physician Assistant

## 2019-04-11 ENCOUNTER — Inpatient Hospital Stay (HOSPITAL_BASED_OUTPATIENT_CLINIC_OR_DEPARTMENT_OTHER): Payer: Medicare Other | Admitting: Physician Assistant

## 2019-04-11 VITALS — BP 114/96 | HR 88 | Temp 98.5°F | Resp 18 | Ht 71.0 in | Wt 192.9 lb

## 2019-04-11 DIAGNOSIS — J910 Malignant pleural effusion: Secondary | ICD-10-CM | POA: Diagnosis not present

## 2019-04-11 DIAGNOSIS — C3491 Malignant neoplasm of unspecified part of right bronchus or lung: Secondary | ICD-10-CM

## 2019-04-11 DIAGNOSIS — C7931 Secondary malignant neoplasm of brain: Secondary | ICD-10-CM | POA: Diagnosis not present

## 2019-04-11 DIAGNOSIS — Z5111 Encounter for antineoplastic chemotherapy: Secondary | ICD-10-CM

## 2019-04-11 DIAGNOSIS — C782 Secondary malignant neoplasm of pleura: Secondary | ICD-10-CM | POA: Diagnosis not present

## 2019-04-11 DIAGNOSIS — Z5112 Encounter for antineoplastic immunotherapy: Secondary | ICD-10-CM

## 2019-04-11 DIAGNOSIS — C787 Secondary malignant neoplasm of liver and intrahepatic bile duct: Secondary | ICD-10-CM | POA: Diagnosis not present

## 2019-04-11 DIAGNOSIS — C3411 Malignant neoplasm of upper lobe, right bronchus or lung: Secondary | ICD-10-CM | POA: Diagnosis not present

## 2019-04-11 LAB — CBC WITH DIFFERENTIAL (CANCER CENTER ONLY)
Abs Immature Granulocytes: 0 10*3/uL (ref 0.00–0.07)
Basophils Absolute: 0 10*3/uL (ref 0.0–0.1)
Basophils Relative: 1 %
Eosinophils Absolute: 0.1 10*3/uL (ref 0.0–0.5)
Eosinophils Relative: 2 %
HCT: 41.7 % (ref 39.0–52.0)
Hemoglobin: 14 g/dL (ref 13.0–17.0)
Immature Granulocytes: 0 %
Lymphocytes Relative: 29 %
Lymphs Abs: 0.8 10*3/uL (ref 0.7–4.0)
MCH: 31.7 pg (ref 26.0–34.0)
MCHC: 33.6 g/dL (ref 30.0–36.0)
MCV: 94.3 fL (ref 80.0–100.0)
Monocytes Absolute: 0.4 10*3/uL (ref 0.1–1.0)
Monocytes Relative: 12 %
Neutro Abs: 1.6 10*3/uL — ABNORMAL LOW (ref 1.7–7.7)
Neutrophils Relative %: 56 %
Platelet Count: 210 10*3/uL (ref 150–400)
RBC: 4.42 MIL/uL (ref 4.22–5.81)
RDW: 11.9 % (ref 11.5–15.5)
WBC Count: 2.9 10*3/uL — ABNORMAL LOW (ref 4.0–10.5)
nRBC: 0 % (ref 0.0–0.2)

## 2019-04-11 LAB — CMP (CANCER CENTER ONLY)
ALT: 21 U/L (ref 0–44)
AST: 18 U/L (ref 15–41)
Albumin: 3 g/dL — ABNORMAL LOW (ref 3.5–5.0)
Alkaline Phosphatase: 61 U/L (ref 38–126)
Anion gap: 10 (ref 5–15)
BUN: 28 mg/dL — ABNORMAL HIGH (ref 8–23)
CO2: 26 mmol/L (ref 22–32)
Calcium: 8.9 mg/dL (ref 8.9–10.3)
Chloride: 105 mmol/L (ref 98–111)
Creatinine: 0.98 mg/dL (ref 0.61–1.24)
GFR, Est AFR Am: >60 mL/min (ref >60)
GFR, Estimated: >60 mL/min (ref >60)
Glucose, Bld: 100 mg/dL — ABNORMAL HIGH (ref 70–99)
Potassium: 5 mmol/L (ref 3.5–5.1)
Sodium: 141 mmol/L (ref 135–145)
Total Bilirubin: <0.2 mg/dL — ABNORMAL LOW (ref 0.3–1.2)
Total Protein: 6.6 g/dL (ref 6.5–8.1)

## 2019-04-11 NOTE — Progress Notes (Signed)
Oncology Nurse Navigator Documentation  Oncology Nurse Navigator Flowsheets 04/11/2019  Diagnosis Status -  Planned Course of Treatment -  Navigator Follow Up Date: -  Navigator Follow Up Reason: -  Navigator Location [facility]  Referral Date to RadOnc/MedOnc -  Navigator Encounter Type Other/per Dr. Julien Nordmann, I updated pathology dept on his request for foundation one and PDL 1 on recent cytology 04/04/2019.    Telephone -  Abnormal Finding Date -  Confirmed Diagnosis Date -  Treatment Initiated Date 04/03/2019  Patient Visit Type -  Treatment Phase Treatment  Barriers/Navigation Needs Coordination of Care  Education -  Interventions Coordination of Care  Coordination of Care Other  Education Method -  Support Groups/Services -  Acuity Level 2  Time Spent with Patient 15

## 2019-04-11 NOTE — Telephone Encounter (Signed)
Scheduled appt per 8/18 los - pt is aware of appts added for labs

## 2019-04-11 NOTE — Progress Notes (Signed)
Cytology sent Foundation one and PDL 1 form 03/29/2019.

## 2019-04-11 NOTE — Progress Notes (Signed)
[facility] OFFICE PROGRESS NOTE  Laurey Morale, MD [place] [address]  DIAGNOSIS: Stage IV (T2b, N2, M1c)presented with right middle lobe lung mass in addition to mediastinal lymphadenopathy as well as malignant right pleural effusion with pleural-based nodules and suspicious right hepatic lesion and the brain metastasis diagnosed in July 2020.  Molecular Biomarkers: [id number] (Exon 20 insertion) 0.2% Dacomitinib,Neratinib,Osimertinib  [id number] 0.1% None      PRIOR THERAPY: SRS treatment to the metastatic disease to the brain under the care of Dr. Tammi Klippel. Completed on 04/07/2019.  CURRENT THERAPY: Systemic chemotherapy with carboplatin for AUC of 5, Alimta 500 mg/M2 and Keytruda 200 mg IV every 3 weeks.  First dose April 04, 2019. Status post 1 cycle.   INTERVAL HISTORY: Kye Hedden 68 y.o. male returns to the clinic for a follow-up visit. The patient is feeling well today without any concerning complaints. The patient recently completed his [facility] treatment to the metastatic disease to the brain under the care of Dr. Tammi Klippel.  He tolerated the treatment well without any adverse side effects.  He also had a pleurx catheter placed under the care of Dr. Roxan Hockey for his recurrent malignant pleural effusions. The patient states that he is draining about 350 mL every other day.  He has a follow-up appointment with Dr. Roxan Hockey on 04/21/2019.  Additionally, the patient mentions that he had some pain and shortness of breath with draining the fluid in the lung. He has tried to modify how much fluid he drains at a given time. He has been trying to drain less fluid and states that he no longer is experiencing pain and shortness of breath.   The patient is being seen today for a one-week follow-up status post his first cycle of systemic chemotherapy.  The patient tolerated it well without any concerning adverse side effects.  He denies any  fever, chills, night sweats, or weight loss.  He denies any shortness of breath, cough, or hemoptysis.  He denies any nausea, vomiting, diarrhea, or constipation but endorses one episode of stomach "uneasiness". Denies any headache or visual changes.  He denies any rashes or skin changes.  The patient states that he has been feeling fairly well and is planning to start exercising again.  For exercise, the patient states that he walks and does yoga. He is here today for a one-week follow-up and to manage any adverse side effects of treatment.   MEDICAL HISTORY: Past Medical History:  Diagnosis Date  . Borderline hypertension   . Borderline systolic hypertension   . Detached vitreous humor, left   . Dyspnea   . ED (erectile dysfunction) of organic origin   . Hyperlipidemia   . Lens subluxation, left   . Lung cancer ([facility])   . Migraine   . Migraine headache    takes PRN Imitrex  . Pseudophakia   . TGA (transient global amnesia) 2017  . Transient global amnesia     ALLERGIES:  has No Known Allergies.  MEDICATIONS:  Current Outpatient Medications  Medication Sig Dispense Refill  . cyanocobalamin (,VITAMIN B-12,) 1000 MCG/ML injection Wife is a Marine scientist and administers injection. Next injection due 04/06/2019.    . folic acid (FOLVITE) 1 MG tablet Take 1 tablet (1 mg total) by mouth daily. 30 tablet 4  . prochlorperazine (COMPAZINE) 10 MG tablet Take 1 tablet (10 mg total) by mouth every 6 (six) hours as needed for nausea or vomiting. 30 tablet 0  . lidocaine-prilocaine (  EMLA) cream Apply to the Port-A-Cath site 30-60 minutes before treatment (Patient not taking: Reported on 04/11/2019) 30 g 0  . SUMAtriptan (IMITREX) 50 MG tablet Take 50 mg by mouth every 2 (two) hours as needed for migraine.     . traMADol (ULTRAM) 50 MG tablet Take 1 tablet (50 mg total) by mouth every 6 (six) hours as needed for severe pain. (Patient not taking: Reported on 04/11/2019) 20 tablet 0   No current  facility-administered medications for this visit.     SURGICAL HISTORY:  Past Surgical History:  Procedure Laterality Date  . CAROTID DOPPLERS  09/2017   Mild non-obstructive disease  . CATARACT EXTRACTION, BILATERAL    . cataract, left  2018  . CHEST TUBE INSERTION Right 03/29/2019   Procedure: INSERTION PLEURAL DRAINAGE CATHETER;  Surgeon: [name], MD;  Location: [facility];  Service: Thoracic;  Laterality: Right;  . COLONOSCOPY  2016   clear, repeat in 10 yrs   . Eye surgeries     , Lens attachment.  Vitrectomy  . FEMORAL HERNIA REPAIR    . HERNIA REPAIR    . IR THORACENTESIS ASP PLEURAL SPACE W/IMG GUIDE  02/22/2019  . IR THORACENTESIS ASP PLEURAL SPACE W/IMG GUIDE  03/13/2019  . LUMBAR LAMINECTOMY    . PLEURAL BIOPSY Right 03/29/2019   Procedure: PLEURAL BIOPSY;  Surgeon: [name], MD;  Location: [facility];  Service: Thoracic;  Laterality: Right;  . PORTACATH PLACEMENT N/A 03/29/2019   Procedure: INSERTION PORT-A-CATH;  Surgeon: [name], MD;  Location: [facility];  Service: Thoracic;  Laterality: N/A;  . retinal attachment, right    . sclearl buckle, right    . victrectomy,right    . VIDEO ASSISTED THORACOSCOPY Right 03/29/2019   Procedure: VIDEO ASSISTED THORACOSCOPY;  Surgeon: [name], MD;  Location: [facility] OR;  Service: Thoracic;  Laterality: Right;    REVIEW OF SYSTEMS:   Review of Systems  Constitutional: Negative for appetite change, chills, fatigue, fever and unexpected weight change.  HENT: Negative for mouth sores, nosebleeds, sore throat and trouble swallowing.   Eyes: Negative for eye problems and icterus.  Respiratory: Positive for shortness of breath with drainage of his pleural effusion (improved from prior). Negative for cough, hemoptysis, and wheezing.   Cardiovascular: Positive for chest pain with drainage of pleural fluid (improved from prior). Negative for leg swelling.  Gastrointestinal: Negative for abdominal pain,  constipation, diarrhea, nausea and vomiting.  Genitourinary: Negative for bladder incontinence, difficulty urinating, dysuria, frequency and hematuria.   Musculoskeletal: Negative for back pain, gait problem, neck pain and neck stiffness.  Skin: Negative for itching and rash.  Neurological: Negative for dizziness, extremity weakness, gait problem, headaches, light-headedness and seizures.  Hematological: Negative for adenopathy. Does not bruise/bleed easily.  Psychiatric/Behavioral: Negative for confusion, depression and sleep disturbance. The patient is not nervous/anxious.     PHYSICAL EXAMINATION:  Blood pressure (!) 114/96, pulse 88, temperature 98.5 F (36.9 C), temperature source Oral, resp. rate 18, height 5' 11" (1.803 m), weight 192 lb 14.4 oz (87.5 kg), SpO2 98 %.  ECOG PERFORMANCE STATUS: 1 - Symptomatic but completely ambulatory  Physical Exam  Constitutional: Oriented to person, place, and time and well-developed, well-nourished, and in no distress. Marland Kitchen  HENT:  Head: Normocephalic and atraumatic.  Mouth/Throat: Oropharynx is clear and moist. No oropharyngeal exudate.  Eyes: Conjunctivae are normal. Right eye exhibits no discharge. Left eye exhibits no discharge. No scleral icterus.  Neck: Normal range of motion. Neck  supple.  Cardiovascular: Normal rate, regular rhythm, normal heart sounds and intact distal pulses.   Pulmonary/Chest: Effort normal. Decreased breath sounds right lower lung field. No respiratory distress. No wheezes. No rales.  Abdominal: Soft. Bowel sounds are normal. Exhibits no distension and no mass. There is no tenderness.  Musculoskeletal: Normal range of motion. Exhibits no edema.  Lymphadenopathy:    No cervical adenopathy.  Neurological: Alert and oriented to person, place, and time. Exhibits normal muscle tone. Gait normal. Coordination normal.  Skin: Skin is warm and dry. No rash noted. Not diaphoretic. No erythema. No pallor.  Psychiatric: Mood,  memory and judgment normal.  Vitals reviewed.  LABORATORY DATA: Lab Results  Component Value Date   WBC 2.9 (L) 04/11/2019   HGB 14.0 04/11/2019   HCT 41.7 04/11/2019   MCV 94.3 04/11/2019   PLT 210 04/11/2019      Chemistry      Component Value Date/Time   NA 140 04/04/2019 0830   K 4.0 04/04/2019 0830   CL 106 04/04/2019 0830   CO2 26 04/04/2019 0830   BUN 23 04/04/2019 0830   CREATININE 0.95 04/04/2019 0830      Component Value Date/Time   CALCIUM 8.6 (L) 04/04/2019 0830   ALKPHOS 54 04/04/2019 0830   AST 17 04/04/2019 0830   ALT 19 04/04/2019 0830   BILITOT 0.6 04/04/2019 0830       RADIOGRAPHIC STUDIES:  Dg Chest 1 View  Result Date: 03/13/2019 CLINICAL DATA:  Post right thoracentesis EXAM: CHEST  1 VIEW COMPARISON:  02/22/2019 FINDINGS: Decreasing right effusion and right base atelectasis. No pneumothorax following thoracentesis. Left lung clear. Heart is normal size. No acute bony abnormality. IMPRESSION: Small right effusion and right base atelectasis, both improving since prior study. No pneumothorax following thoracentesis. Electronically Signed   By: Rolm Baptise M.D.   On: 03/13/2019 11:06   Dg Chest 2 View  Result Date: 03/28/2019 CLINICAL DATA:  Pre-admission for right VATS and pleural biopsy EXAM: CHEST - 2 VIEW COMPARISON:  None. FINDINGS: Masslike widening of the right paratracheal stripe and nodular opacities in the right hilum and right lung compatible with masses seen on prior CT. Persistent right pleural effusion with fluid tracking in the minor fissure. Left lung is largely clear. Remaining aerated portions of the right lung are clear. No acute osseous or soft tissue abnormality. Right heart border is largely obscured by the right effusion. The left heart border is unremarkable. IMPRESSION: 1. Redemonstrated nodular masses in the right lung, mediastinum and hilum 2. Persistent right pleural effusion with fluid tracking in the minor fissure. Electronically  Signed   By: Lovena Le M.D.   On: 03/28/2019 15:52   Dg Chest 2 View  Result Date: 03/21/2019 CLINICAL DATA:  Shortness of breath and pleural effusion. History of lung carcinoma EXAM: CHEST - 2 VIEW COMPARISON:  March 13, 2019 FINDINGS: There is persistent pleural effusion on the right which may be slightly increased compared to recent study. There is patchy atelectasis in the right middle and lower lobe regions. The lungs elsewhere are clear. Heart size and pulmonary vascularity are normal. No adenopathy. No bone lesions. No pneumothorax. IMPRESSION: Persistent right pleural effusion, marginally larger than on recent study. Atelectatic change in the right middle and lower lobes inferiorly. Lungs elsewhere clear. Stable cardiac silhouette. Electronically Signed   By: Lowella Grip III M.D.   On: 03/21/2019 15:47   Mr Jeri Cos HY Contrast  Result Date: 03/31/2019 CLINICAL DATA:  Metastatic  lung cancer. Stereotactic radio surgery targeting EXAM: MRI HEAD WITHOUT AND WITH CONTRAST TECHNIQUE: Multiplanar, multiecho pulse sequences of the brain and surrounding structures were obtained without and with intravenous contrast. CONTRAST:  18m MULTIHANCE GADOBENATE DIMEGLUMINE 529 MG/ML IV SOLN COMPARISON:  MRI brain 03/27/2019 FINDINGS: Brain: SRS protocol using 3 Tesla MRI. Solitary metastatic deposit right parietal lobe measuring 6.2 mm appears unchanged from the prior study. Mild amount of internal hemorrhage in the lesion. No surrounding edema. No other enhancing lesions identified. Negative for acute infarct. Several small white matter hyperintensities bilaterally most compatible with chronic microvascular ischemia. Ventricle size and cerebral volume normal. No midline shift. Vascular: Normal arterial flow voids. Skull and upper cervical spine: Negative Sinuses/Orbits: Paranasal sinuses clear.  Bilateral cataract surgery Other: None IMPRESSION: 6.2 mm solitary metastatic deposit right parietal lobe with  prior hemorrhage. No significant edema. No second lesion identified. Electronically Signed   By: CFranchot GalloM.D.   On: 03/31/2019 14:41   Mr BJeri CosWDDContrast  Result Date: 03/27/2019 CLINICAL DATA:  68year old male initial staging, lung cancer with malignant pleural effusion. EXAM: MRI HEAD WITHOUT AND WITH CONTRAST TECHNIQUE: Multiplanar, multiecho pulse sequences of the brain and surrounding structures were obtained without and with intravenous contrast. CONTRAST:  7.5 milliliters Gadavist COMPARISON:  PET-CT today reported separately. FINDINGS: Brain: There is a round 6-7 millimeter enhancing lesion at the right sensory strip on series 11, image 77 and series 13, image 6. No associated edema or mass effect. There is faint hemosiderin within the lesion on series 7, image 24. No other abnormal enhancement identified.  No dural thickening. No restricted diffusion to suggest acute infarction. No midline shift, mass effect, ventriculomegaly, extra-axial collection or acute intracranial hemorrhage. Cervicomedullary junction and pituitary are within normal limits. Underlying largely normal for age gray and white matter signal throughout the brain. No cortical encephalomalacia, although there is a chronic microhemorrhage in the left parietal lobe on series 7, image 21. No other chronic blood products identified. Vascular: Major intracranial vascular flow voids are preserved. The major dural venous sinuses are enhancing and appear to be patent. Skull and upper cervical spine: Negative visible cervical spine. Negative visible spinal cord. Visualized bone marrow signal is within normal limits. Sinuses/Orbits: Postoperative changes to both globes, otherwise negative orbits. Trace paranasal sinus mucosal thickening. Other: Mastoids are clear. Visible internal auditory structures appear normal. Scalp and face soft tissues appear within normal limits. IMPRESSION: 1. Positive for solitary right parietal lobe brain  metastasis, 6-7 mm and located at the sensory strip. Faint hemosiderin within the lesion, but no associated edema or mass effect. 2. Negative for age MRI appearance of the brain otherwise; tiny nonspecific chronic microhemorrhage in the left parietal lobe. Electronically Signed   By: HGenevie AnnM.D.   On: 03/27/2019 11:59   Nm Pet Image Initial (pi) Skull Base To Thigh  Result Date: 03/27/2019 CLINICAL DATA:  Initial treatment strategy for lung cancer. Malignant pleural effusion. EXAM: NUCLEAR MEDICINE PET SKULL BASE TO THIGH TECHNIQUE: 9.57 mCi F-18 FDG was injected intravenously. Full-ring PET imaging was performed from the skull base to thigh after the radiotracer. CT data was obtained and used for attenuation correction and anatomic localization. Fasting blood glucose: 98 mg/dl COMPARISON:  Chest CT 02/09/2019 FINDINGS: Mediastinal blood pool activity: SUV max 2.58 Liver activity: SUV max NA NECK: No hypermetabolic lymph nodes in the neck. Incidental CT findings: none CHEST: Area of marked hypermetabolism and a somewhat rounded masslike lesion in the medial aspect of  the right upper lobe possibly reflecting the patient's primary lung neoplasm. SUV max is 22.12. This lesion measures approximately 2.5 cm on image number 78. Second area of fairly significant hypermetabolism noted in the right lower lobe and possibly partly in the right middle lobe. This has an SUV max of 15.18. Extensive hypermetabolic pleural metastasis involving the right hemithorax. Pleural lesion anteriorly on image number 80 measures 10 mm and SUV max is 17.2. There are also numerous small pleural nodules along the major and minor fissures. No obvious pulmonary nodules. Extensive mediastinal and hilar lymphadenopathy. 18 mm pretracheal nodal lesion on image number 81 has an SUV max of 18.74. Subcarinal lesion measures 11 mm on image number 89 and has an SUV max of 18.68. Incidental CT findings: Persistent large right pleural effusion.  ABDOMEN/PELVIS: There is a vague low-attenuation lesion in the liver measuring 2 cm. This is in segment 7 and is markedly hypermetabolic. The SUV max is 19.14. I am not sure if this is an intraparenchymal metastasis or a peritoneal surface metastasis. No other liver lesions are identified. Right-sided retroperitoneal lymph node just posterior to the IVC on image number 159 is hypermetabolic with SUV max of 45.85. Small lymph node anterior to the IVC on image number 156 measures 5 mm and SUV max is 3.43. No adrenal gland lesions. Incidental CT findings: Small left-sided bladder diverticulum. Enlarged prostate gland with median lobe hypertrophy impressing on the base of the bladder. SKELETON: No findings to suggest osseous metastatic disease. Incidental CT findings: none IMPRESSION: 1. Probable right upper lobe and right lower lobe/middle lobe lung cancer with extensive metastatic pleural disease and mediastinal and hilar disease. There is also a large malignant right pleural effusion. 2. Evidence of disease below the diaphragm with a right hepatic lobe lesion and metastatic adenopathy in the abdomen. 3. No findings suspicious for osseous metastatic disease or pulmonary metastatic disease. Electronically Signed   By: Marijo Sanes M.D.   On: 03/27/2019 10:09   Dg Chest Port 1 View  Result Date: 03/30/2019 CLINICAL DATA:  68 year old male lung cancer with malignant pleural effusion. Postoperative day 1 right video-assisted thoracoscopy. Port-A-Cath placement, pleural biopsy and pleural drainage. EXAM: PORTABLE CHEST 1 VIEW COMPARISON:  03/29/2019 and earlier. FINDINGS: Portable AP upright view at 0632 hours. Stable right IJ central line. Stable right subclavian Port-A-Cath. Right chest tube remains in place. No pneumothorax. Stable lung volumes with mildly improved right lung base ventilation. Patchy residual infrahilar opacity. Stable cardiac size and mediastinal contours. Allowing for portable technique the left  lung remains clear. Negative visible bowel gas pattern. IMPRESSION: 1. Stable lines and tubes including right chest tube. No pneumothorax. 2. Mildly improved right lung base ventilation. Patchy residual infrahilar opacity. 3. No new cardiopulmonary abnormality. Electronically Signed   By: Genevie Ann M.D.   On: 03/30/2019 09:08   Dg Chest Port 1 View  Result Date: 03/29/2019 CLINICAL DATA:  Status post VATS. Status post porta catheter placement. EXAM: PORTABLE CHEST 1 VIEW COMPARISON:  03/28/2019. FINDINGS: Interval right subclavian porta catheter with its tip in the proximal superior vena cava. Interval right jugular catheter with its tip also in the proximal superior vena cava. No pneumothorax. Normal sized heart. Decreased right pleural fluid and right basilar atelectasis. Interval mild patchy density in the right lower lung zone. Clear left lung. Unremarkable bones. IMPRESSION: 1. Right jugular catheter tip in the proximal superior vena cava without pneumothorax. 2. Right subclavian porta catheter tip in the proximal superior vena cava. 3. Decreased  right pleural fluid and right basilar atelectasis. 4. Interval mild patchy atelectasis or pneumonia in the right lower lung zone. Electronically Signed   By: Claudie Revering M.D.   On: 03/29/2019 19:17   Dg Fluoro Guide Cv Line-no Report  Result Date: 03/29/2019 Fluoroscopy was utilized by the requesting physician.  No radiographic interpretation.   Ir Thoracentesis Asp Pleural Space W/img Guide  Result Date: 03/13/2019 INDICATION: Patient with history of lung adenocarcinoma, pleural effusion. Request is made for therapeutic right thoracentesis. EXAM: ULTRASOUND GUIDED THERAPEUTIC RIGHT THORACENTESIS MEDICATIONS: 10 mL 1% lidocaine COMPLICATIONS: None immediate. PROCEDURE: An ultrasound guided thoracentesis was thoroughly discussed with the patient and questions answered. The benefits, risks, alternatives and complications were also discussed. The patient  understands and wishes to proceed with the procedure. Written consent was obtained. Ultrasound was performed to localize and mark an adequate pocket of fluid in the right chest. The area was then prepped and draped in the normal sterile fashion. 1% Lidocaine was used for local anesthesia. Under ultrasound guidance a 6 Fr Safe-T-Centesis catheter was introduced. Thoracentesis was performed. The catheter was removed and a dressing applied. FINDINGS: A total of approximately 1.9 liters of amber fluid was removed. Samples were sent to the laboratory as requested by the clinical team. IMPRESSION: Successful ultrasound guided therapeutic right thoracentesis yielding 1.9 L of pleural fluid. Read by: Brynda Greathouse PA-C Electronically Signed   By: Aletta Edouard M.D.   On: 03/13/2019 11:10     ASSESSMENT/PLAN:  This is a very pleasant 68 year old Caucasian male who was recently diagnosed with stage IV non-small cell lung cancer, adenocarcinoma with a positive EGFR resistant mutation in exon 20.  He presented with extensive disease involving the right upper lobe, right middle lobe, and right lower lobe with extensive pleural-based metastases.  He also has mediastinal and hilar adenopathy and a right malignant pleural effusion.  He also has metastatic disease to the brain and a suspicious liver lesion.  He recently completed SRS treatment to the metastatic disease to the brain under care of Dr. Tammi Klippel.  He also recently had a Pleurx catheter placed by Dr. Roxan Hockey.  He has a follow up appointment with him on 04-21-2019.  He is currently undergoing systemic chemotherapy with carboplatin AUC of 5, Alimta 500 mg/m, and Keytruda 200 mg IV every 3 weeks.  He is status post his first cycle of treatment and he tolerated it well without any adverse side effects.  The patient was seen with Dr. Julien Nordmann today.  Labs were reviewed with the patient.    We will see him back for follow-up visit in 2 weeks for evaluation  before starting cycle #2.  He will continue taking Compazine 10 mg p.o. every 6 hours as needed for nausea and vomiting.  He will continue to take 1 mg p.o. daily folic acid.  Our office will reach out to pathology to see if patient's fluid from his thoracentesis can be sent off for foundation 1 testing.   The patient was advised to call immediately if he has any concerning symptoms in the interval. The patient voices understanding of current disease status and treatment options and is in agreement with the current care plan. All questions were answered. The patient knows to call the clinic with any problems, questions or concerns. We can certainly see the patient much sooner if necessary.   No orders of the defined types were placed in this encounter.    Cassandra L Heilingoetter, PA-C 04/11/19  ADDENDUM: Hematology/Oncology Attending:  I had a face-to-face encounter with the patient today.  I recommended his care plan.  This is a very pleasant 68 years old white male with stage IV non-small cell lung cancer, adenocarcinoma with positive EGFR mutation but in exon 20 (resistant mutation).  The patient is currently undergoing systemic chemotherapy with carboplatin, Alimta and Keytruda status post 1 cycle started last week. He tolerated the first week of his treatment fairly well.  The patient was also seen recently by Dr. Roxan Hockey for right Pleurx catheter placement as well as Port-A-Cath placement.  We will send the tissue block to foundation 1 for molecular studies and PDL 1 expression. I recommended for the patient to continue his current systemic chemotherapy for now. He will come back for follow-up visit in 2 weeks for evaluation before the next cycle of his treatment. He was advised to call immediately if he has any concerning symptoms in the interval.  Disclaimer: This note was dictated with voice recognition software. Similar sounding words can inadvertently be transcribed and may be  missed upon review. Eilleen Kempf, MD 04/11/19

## 2019-04-14 DIAGNOSIS — N400 Enlarged prostate without lower urinary tract symptoms: Secondary | ICD-10-CM | POA: Diagnosis not present

## 2019-04-14 DIAGNOSIS — Z48813 Encounter for surgical aftercare following surgery on the respiratory system: Secondary | ICD-10-CM | POA: Diagnosis not present

## 2019-04-14 DIAGNOSIS — C7931 Secondary malignant neoplasm of brain: Secondary | ICD-10-CM | POA: Diagnosis not present

## 2019-04-14 DIAGNOSIS — J910 Malignant pleural effusion: Secondary | ICD-10-CM | POA: Diagnosis not present

## 2019-04-14 DIAGNOSIS — C3411 Malignant neoplasm of upper lobe, right bronchus or lung: Secondary | ICD-10-CM | POA: Diagnosis not present

## 2019-04-14 DIAGNOSIS — G43909 Migraine, unspecified, not intractable, without status migrainosus: Secondary | ICD-10-CM | POA: Diagnosis not present

## 2019-04-16 NOTE — Progress Notes (Signed)
  Radiation Oncology         [phone]) [phone] ________________________________  Name: Ethan Brown MRN: [id number]  Date: 04/07/2019  DOB: [birth year]  End of Treatment Note  Diagnosis:   68 yo man with a solitary 6.2 mm right parietal brain metastasis from Stage IV (T2b, N2, M1c) adenocarcinoma of the right middle lobe of the lung.     Indication for treatment:  Palliation       Radiation treatment dates:   04/07/19  Site/dose/beams/energy:   Ethan Brown received stereotactic radiosurgery to the following targets: Right parietal 6 mm target was treated using 3 Dynamic Conformal Arcs to a prescription dose of 20 Gy.  ExacTrac registration was performed for each couch angle.  The 100% isodose line was prescribed.  6 MV X-rays were delivered in the flattening filter free beam mode.  Narrative: The patient tolerated radiation treatment relatively well.   No acute effects were noted.  Plan: The patient has completed radiation treatment. The patient will return to radiation oncology clinic for routine followup in one month. I advised him to call or return sooner if he has any questions or concerns related to his recovery or treatment. ________________________________  Sheral Apley. Tammi Klippel, M.D.

## 2019-04-17 DIAGNOSIS — G454 Transient global amnesia: Secondary | ICD-10-CM

## 2019-04-17 DIAGNOSIS — Z48813 Encounter for surgical aftercare following surgery on the respiratory system: Secondary | ICD-10-CM | POA: Diagnosis not present

## 2019-04-17 DIAGNOSIS — N323 Diverticulum of bladder: Secondary | ICD-10-CM

## 2019-04-17 DIAGNOSIS — E538 Deficiency of other specified B group vitamins: Secondary | ICD-10-CM

## 2019-04-17 DIAGNOSIS — R030 Elevated blood-pressure reading, without diagnosis of hypertension: Secondary | ICD-10-CM

## 2019-04-17 DIAGNOSIS — Z1159 Encounter for screening for other viral diseases: Secondary | ICD-10-CM

## 2019-04-17 DIAGNOSIS — J9811 Atelectasis: Secondary | ICD-10-CM

## 2019-04-17 DIAGNOSIS — F17210 Nicotine dependence, cigarettes, uncomplicated: Secondary | ICD-10-CM

## 2019-04-17 DIAGNOSIS — C3411 Malignant neoplasm of upper lobe, right bronchus or lung: Secondary | ICD-10-CM | POA: Diagnosis not present

## 2019-04-17 DIAGNOSIS — C7931 Secondary malignant neoplasm of brain: Secondary | ICD-10-CM | POA: Diagnosis not present

## 2019-04-17 DIAGNOSIS — H33302 Unspecified retinal break, left eye: Secondary | ICD-10-CM

## 2019-04-17 DIAGNOSIS — N400 Enlarged prostate without lower urinary tract symptoms: Secondary | ICD-10-CM

## 2019-04-17 DIAGNOSIS — Z9181 History of falling: Secondary | ICD-10-CM

## 2019-04-17 DIAGNOSIS — H43812 Vitreous degeneration, left eye: Secondary | ICD-10-CM

## 2019-04-17 DIAGNOSIS — G43909 Migraine, unspecified, not intractable, without status migrainosus: Secondary | ICD-10-CM

## 2019-04-17 DIAGNOSIS — G629 Polyneuropathy, unspecified: Secondary | ICD-10-CM

## 2019-04-17 DIAGNOSIS — E785 Hyperlipidemia, unspecified: Secondary | ICD-10-CM

## 2019-04-17 DIAGNOSIS — J910 Malignant pleural effusion: Secondary | ICD-10-CM | POA: Diagnosis not present

## 2019-04-17 DIAGNOSIS — N529 Male erectile dysfunction, unspecified: Secondary | ICD-10-CM

## 2019-04-17 DIAGNOSIS — Z79891 Long term (current) use of opiate analgesic: Secondary | ICD-10-CM

## 2019-04-18 ENCOUNTER — Encounter: Payer: Self-pay | Admitting: *Deleted

## 2019-04-18 ENCOUNTER — Inpatient Hospital Stay: Payer: Medicare Other

## 2019-04-18 ENCOUNTER — Encounter (HOSPITAL_COMMUNITY): Payer: Self-pay | Admitting: Internal Medicine

## 2019-04-18 ENCOUNTER — Other Ambulatory Visit: Payer: Self-pay

## 2019-04-18 DIAGNOSIS — C3411 Malignant neoplasm of upper lobe, right bronchus or lung: Secondary | ICD-10-CM | POA: Diagnosis not present

## 2019-04-18 DIAGNOSIS — N400 Enlarged prostate without lower urinary tract symptoms: Secondary | ICD-10-CM | POA: Diagnosis not present

## 2019-04-18 DIAGNOSIS — C782 Secondary malignant neoplasm of pleura: Secondary | ICD-10-CM | POA: Diagnosis not present

## 2019-04-18 DIAGNOSIS — J910 Malignant pleural effusion: Secondary | ICD-10-CM | POA: Diagnosis not present

## 2019-04-18 DIAGNOSIS — C3491 Malignant neoplasm of unspecified part of right bronchus or lung: Secondary | ICD-10-CM

## 2019-04-18 DIAGNOSIS — C7931 Secondary malignant neoplasm of brain: Secondary | ICD-10-CM | POA: Diagnosis not present

## 2019-04-18 DIAGNOSIS — G43909 Migraine, unspecified, not intractable, without status migrainosus: Secondary | ICD-10-CM | POA: Diagnosis not present

## 2019-04-18 DIAGNOSIS — Z48813 Encounter for surgical aftercare following surgery on the respiratory system: Secondary | ICD-10-CM | POA: Diagnosis not present

## 2019-04-18 DIAGNOSIS — Z5111 Encounter for antineoplastic chemotherapy: Secondary | ICD-10-CM | POA: Diagnosis not present

## 2019-04-18 DIAGNOSIS — C787 Secondary malignant neoplasm of liver and intrahepatic bile duct: Secondary | ICD-10-CM | POA: Diagnosis not present

## 2019-04-18 LAB — CBC WITH DIFFERENTIAL (CANCER CENTER ONLY)
Abs Immature Granulocytes: 0.02 10*3/uL (ref 0.00–0.07)
Basophils Absolute: 0 10*3/uL (ref 0.0–0.1)
Basophils Relative: 0 %
Eosinophils Absolute: 0.1 10*3/uL (ref 0.0–0.5)
Eosinophils Relative: 2 %
HCT: 39 % (ref 39.0–52.0)
Hemoglobin: 13 g/dL (ref 13.0–17.0)
Immature Granulocytes: 0 %
Lymphocytes Relative: 17 %
Lymphs Abs: 0.8 10*3/uL (ref 0.7–4.0)
MCH: 31.6 pg (ref 26.0–34.0)
MCHC: 33.3 g/dL (ref 30.0–36.0)
MCV: 94.9 fL (ref 80.0–100.0)
Monocytes Absolute: 0.4 10*3/uL (ref 0.1–1.0)
Monocytes Relative: 8 %
Neutro Abs: 3.4 10*3/uL (ref 1.7–7.7)
Neutrophils Relative %: 73 %
Platelet Count: 166 10*3/uL (ref 150–400)
RBC: 4.11 MIL/uL — ABNORMAL LOW (ref 4.22–5.81)
RDW: 12.3 % (ref 11.5–15.5)
WBC Count: 4.8 10*3/uL (ref 4.0–10.5)
nRBC: 0 % (ref 0.0–0.2)

## 2019-04-18 LAB — CMP (CANCER CENTER ONLY)
ALT: 24 U/L (ref 0–44)
AST: 18 U/L (ref 15–41)
Albumin: 2.8 g/dL — ABNORMAL LOW (ref 3.5–5.0)
Alkaline Phosphatase: 70 U/L (ref 38–126)
Anion gap: 6 (ref 5–15)
BUN: 26 mg/dL — ABNORMAL HIGH (ref 8–23)
CO2: 26 mmol/L (ref 22–32)
Calcium: 8.5 mg/dL — ABNORMAL LOW (ref 8.9–10.3)
Chloride: 107 mmol/L (ref 98–111)
Creatinine: 1.12 mg/dL (ref 0.61–1.24)
GFR, Est AFR Am: >60 mL/min (ref >60)
GFR, Estimated: >60 mL/min (ref >60)
Glucose, Bld: 106 mg/dL — ABNORMAL HIGH (ref 70–99)
Potassium: 4.2 mmol/L (ref 3.5–5.1)
Sodium: 139 mmol/L (ref 135–145)
Total Bilirubin: <0.2 mg/dL — ABNORMAL LOW (ref 0.3–1.2)
Total Protein: 5.9 g/dL — ABNORMAL LOW (ref 6.5–8.1)

## 2019-04-18 NOTE — Progress Notes (Signed)
Oncology Nurse Navigator Documentation  Oncology Nurse Navigator Flowsheets 04/18/2019  Diagnosis Status -  Planned Course of Treatment -  Navigator Follow Up Date: -  Navigator Follow Up Reason: -  Navigator Location [facility]  Referral Date to RadOnc/MedOnc -  Navigator Encounter Type Other/I followed up on [facility] and noticed there is a hold on tissue being tested.  I called [facility] and was told the test is on hold due to possible suboptimal tissue sample.  I updated Dr. Julien Nordmann and he asked for them to complete the test. I updated [facility] on this.  They will proceed with test.   Telephone -  Abnormal Finding Date -  Confirmed Diagnosis Date -  Treatment Initiated Date -  Patient Visit Type -  Treatment Phase Treatment  Barriers/Navigation Needs Coordination of Care  Education -  Interventions Coordination of Care  Coordination of Care Other  Education Method -  Support Groups/Services -  Acuity Level 2  Time Spent with Patient 30

## 2019-04-20 ENCOUNTER — Other Ambulatory Visit: Payer: Self-pay | Admitting: Thoracic Surgery (Cardiothoracic Vascular Surgery)

## 2019-04-20 DIAGNOSIS — C3411 Malignant neoplasm of upper lobe, right bronchus or lung: Secondary | ICD-10-CM | POA: Diagnosis not present

## 2019-04-20 DIAGNOSIS — J910 Malignant pleural effusion: Secondary | ICD-10-CM | POA: Diagnosis not present

## 2019-04-20 DIAGNOSIS — J90 Pleural effusion, not elsewhere classified: Secondary | ICD-10-CM

## 2019-04-20 DIAGNOSIS — N400 Enlarged prostate without lower urinary tract symptoms: Secondary | ICD-10-CM | POA: Diagnosis not present

## 2019-04-20 DIAGNOSIS — Z48813 Encounter for surgical aftercare following surgery on the respiratory system: Secondary | ICD-10-CM | POA: Diagnosis not present

## 2019-04-20 DIAGNOSIS — C7931 Secondary malignant neoplasm of brain: Secondary | ICD-10-CM | POA: Diagnosis not present

## 2019-04-20 DIAGNOSIS — G43909 Migraine, unspecified, not intractable, without status migrainosus: Secondary | ICD-10-CM | POA: Diagnosis not present

## 2019-04-20 NOTE — Progress Notes (Signed)
xcxr

## 2019-04-21 ENCOUNTER — Other Ambulatory Visit: Payer: Self-pay

## 2019-04-21 ENCOUNTER — Encounter: Payer: Self-pay | Admitting: Thoracic Surgery (Cardiothoracic Vascular Surgery)

## 2019-04-21 ENCOUNTER — Ambulatory Visit
Admission: RE | Admit: 2019-04-21 | Discharge: 2019-04-21 | Disposition: A | Discharge status: Home / Self Care | Payer: Medicare Other | Source: Ambulatory Visit | Attending: Thoracic Surgery (Cardiothoracic Vascular Surgery) | Admitting: Thoracic Surgery (Cardiothoracic Vascular Surgery)

## 2019-04-21 ENCOUNTER — Ambulatory Visit (INDEPENDENT_AMBULATORY_CARE_PROVIDER_SITE_OTHER): Payer: Self-pay | Admitting: Thoracic Surgery (Cardiothoracic Vascular Surgery)

## 2019-04-21 VITALS — BP 130/87 | HR 81 | Temp 97.7°F | Resp 18 | Ht 71.0 in | Wt 198.0 lb

## 2019-04-21 DIAGNOSIS — J90 Pleural effusion, not elsewhere classified: Secondary | ICD-10-CM | POA: Diagnosis not present

## 2019-04-21 DIAGNOSIS — C3491 Malignant neoplasm of unspecified part of right bronchus or lung: Secondary | ICD-10-CM

## 2019-04-21 NOTE — Progress Notes (Signed)
[address]       [place], [address]             [phone]       HPI: Mr. Mott returns for a scheduled follow-up visit   Ayron Fillinger is a 68 year old man who is a lifelong non-smoker who presented initially with a cough and pleuritic chest pain.  He ultimately was found to have stage IV adenocarcinoma of the lung with malignant pleural effusion.  Thoracentesis was diagnostic but there was not sufficient tissue for molecular testing.  I did a right VATS for pleural biopsy and placement of a pleural catheter on 03/30/2019.  He did well postoperatively and went home the following day.  He had a lot of pain initially but that is improved significantly.  He drained 1 time about 600 mL and had pain for 24 hours afterwards.  He is currently draining every other day and drained about 450 mL yesterday.  He stopped the drainage when he started having some discomfort.  His respiratory status is stable.  He has completed radiation and has now started chemotherapy with carboplatin, Alimta, and Keytruda.  Past Medical History:  Diagnosis Date  . Borderline hypertension   . Borderline systolic hypertension   . Detached vitreous humor, left   . Dyspnea   . ED (erectile dysfunction) of organic origin   . Hyperlipidemia   . Lens subluxation, left   . Lung cancer ([place])   . Migraine   . Migraine headache    takes PRN Imitrex  . Pseudophakia   . TGA (transient global amnesia) 2017  . Transient global amnesia     Current Outpatient Medications  Medication Sig Dispense Refill  . cyanocobalamin (,VITAMIN B-12,) 1000 MCG/ML injection Wife is a Marine scientist and administers injection. Next injection due 04/06/2019.    . folic acid (FOLVITE) 1 MG tablet Take 1 tablet (1 mg total) by mouth daily. 30 tablet 4  . lidocaine-prilocaine (EMLA) cream Apply to the Port-A-Cath site 30-60 minutes before treatment 30 g 0  . prochlorperazine (COMPAZINE) 10 MG tablet Take 1 tablet (10 mg total)  by mouth every 6 (six) hours as needed for nausea or vomiting. 30 tablet 0  . SUMAtriptan (IMITREX) 50 MG tablet Take 50 mg by mouth every 2 (two) hours as needed for migraine.     . traMADol (ULTRAM) 50 MG tablet Take 1 tablet (50 mg total) by mouth every 6 (six) hours as needed for severe pain. 20 tablet 0   No current facility-administered medications for this visit.     Physical Exam BP 130/87 (BP Location: Right Arm, Patient Position: Sitting, Cuff Size: Normal)   Pulse 81   Temp 97.7 F (36.5 C)   Resp 18   Ht 5\' 11"  (1.803 m)   Wt 198 lb (89.8 kg)   SpO2 96% Comment: RA  BMI 27.65 kg/m  68 year old man in no acute distress Alert and oriented x3 with no focal deficits Incisions well-healed Catheter dressed Equal breath sounds bilaterally  Diagnostic Tests: CHEST - 2 VIEW  COMPARISON:  Radiographs of March 30, 2019.  FINDINGS: The heart size and mediastinal contours are within normal limits. Right subclavian Port-A-Cath is unchanged in position. No pneumothorax is noted. Left lung is clear. Right-sided chest tube is unchanged in position. Increased right basilar opacity is noted concerning for worsening atelectasis and associated pleural effusion. Stable right midlung scarring or subsegmental atelectasis is noted. The visualized skeletal structures are unremarkable.  IMPRESSION: Stable position of right-sided chest tube without pneumothorax. Increased right basilar opacity is noted concerning for worsening atelectasis and associated mild pleural effusion.   Electronically Signed   By: Marijo Conception M.D.   On: 04/21/2019 10:56 I personally reviewed the chest x-ray images.  There is a small right effusion.  Significantly improved from early August.  Impression: Mr. Butler is a 68 year old gentleman with stage IV lung cancer with a malignant right pleural effusion.  He had a pleural biopsy and pleural catheter placement on 04/03/2019.  He did well  postoperatively.  He has completed radiation and is currently undergoing chemotherapy with Dr. Julien Nordmann.  He is currently draining his pleural catheter every other day.  He got about 450 mL out yesterday.  There is some variation from day-to-day.  I recommended that he continue with every other day drainage.  If he has less than 150 mL 2 consecutive times he will decrease the drainage frequency.  Initially we will go to every third day then once weekly.  He should avoid submerging in water such as a swimming pool or hot tub.  Other than that there are no restrictions on his activities from my standpoint.  Plan: Return in 3 weeks with PA and lateral chest x-ray  [name], MD [facility] [phone]

## 2019-04-24 ENCOUNTER — Encounter (HOSPITAL_COMMUNITY): Payer: Self-pay | Admitting: Internal Medicine

## 2019-04-25 ENCOUNTER — Inpatient Hospital Stay: Payer: Medicare Other

## 2019-04-25 ENCOUNTER — Inpatient Hospital Stay: Payer: Medicare Other | Attending: Internal Medicine | Admitting: Internal Medicine

## 2019-04-25 ENCOUNTER — Encounter: Payer: Self-pay | Admitting: Internal Medicine

## 2019-04-25 ENCOUNTER — Other Ambulatory Visit: Payer: Self-pay

## 2019-04-25 VITALS — BP 127/78 | HR 75 | Temp 98.7°F | Resp 18 | Ht 71.0 in | Wt 199.2 lb

## 2019-04-25 DIAGNOSIS — C3491 Malignant neoplasm of unspecified part of right bronchus or lung: Secondary | ICD-10-CM

## 2019-04-25 DIAGNOSIS — Z95828 Presence of other vascular implants and grafts: Secondary | ICD-10-CM | POA: Insufficient documentation

## 2019-04-25 DIAGNOSIS — C787 Secondary malignant neoplasm of liver and intrahepatic bile duct: Secondary | ICD-10-CM | POA: Diagnosis not present

## 2019-04-25 DIAGNOSIS — Z5112 Encounter for antineoplastic immunotherapy: Secondary | ICD-10-CM

## 2019-04-25 DIAGNOSIS — J910 Malignant pleural effusion: Secondary | ICD-10-CM | POA: Diagnosis not present

## 2019-04-25 DIAGNOSIS — R5382 Chronic fatigue, unspecified: Secondary | ICD-10-CM

## 2019-04-25 DIAGNOSIS — Z5111 Encounter for antineoplastic chemotherapy: Secondary | ICD-10-CM | POA: Diagnosis not present

## 2019-04-25 DIAGNOSIS — C7931 Secondary malignant neoplasm of brain: Secondary | ICD-10-CM

## 2019-04-25 DIAGNOSIS — C782 Secondary malignant neoplasm of pleura: Secondary | ICD-10-CM | POA: Diagnosis not present

## 2019-04-25 DIAGNOSIS — C3411 Malignant neoplasm of upper lobe, right bronchus or lung: Secondary | ICD-10-CM | POA: Diagnosis not present

## 2019-04-25 DIAGNOSIS — Z79899 Other long term (current) drug therapy: Secondary | ICD-10-CM | POA: Insufficient documentation

## 2019-04-25 LAB — CBC WITH DIFFERENTIAL (CANCER CENTER ONLY)
Abs Immature Granulocytes: 0.02 10*3/uL (ref 0.00–0.07)
Basophils Absolute: 0 10*3/uL (ref 0.0–0.1)
Basophils Relative: 1 %
Eosinophils Absolute: 0.1 10*3/uL (ref 0.0–0.5)
Eosinophils Relative: 1 %
HCT: 38.1 % — ABNORMAL LOW (ref 39.0–52.0)
Hemoglobin: 12.9 g/dL — ABNORMAL LOW (ref 13.0–17.0)
Immature Granulocytes: 1 %
Lymphocytes Relative: 20 %
Lymphs Abs: 0.9 10*3/uL (ref 0.7–4.0)
MCH: 31.7 pg (ref 26.0–34.0)
MCHC: 33.9 g/dL (ref 30.0–36.0)
MCV: 93.6 fL (ref 80.0–100.0)
Monocytes Absolute: 0.4 10*3/uL (ref 0.1–1.0)
Monocytes Relative: 9 %
Neutro Abs: 3 10*3/uL (ref 1.7–7.7)
Neutrophils Relative %: 68 %
Platelet Count: 233 10*3/uL (ref 150–400)
RBC: 4.07 MIL/uL — ABNORMAL LOW (ref 4.22–5.81)
RDW: 12.5 % (ref 11.5–15.5)
WBC Count: 4.4 10*3/uL (ref 4.0–10.5)
nRBC: 0 % (ref 0.0–0.2)

## 2019-04-25 LAB — CMP (CANCER CENTER ONLY)
ALT: 23 U/L (ref 0–44)
AST: 17 U/L (ref 15–41)
Albumin: 2.8 g/dL — ABNORMAL LOW (ref 3.5–5.0)
Alkaline Phosphatase: 76 U/L (ref 38–126)
Anion gap: 8 (ref 5–15)
BUN: 21 mg/dL (ref 8–23)
CO2: 25 mmol/L (ref 22–32)
Calcium: 8.4 mg/dL — ABNORMAL LOW (ref 8.9–10.3)
Chloride: 108 mmol/L (ref 98–111)
Creatinine: 1.03 mg/dL (ref 0.61–1.24)
GFR, Est AFR Am: >60 mL/min (ref >60)
GFR, Estimated: >60 mL/min (ref >60)
Glucose, Bld: 95 mg/dL (ref 70–99)
Potassium: 4.2 mmol/L (ref 3.5–5.1)
Sodium: 141 mmol/L (ref 135–145)
Total Bilirubin: 0.3 mg/dL (ref 0.3–1.2)
Total Protein: 6 g/dL — ABNORMAL LOW (ref 6.5–8.1)

## 2019-04-25 LAB — TSH: TSH: 0.914 u[IU]/mL (ref 0.320–4.118)

## 2019-04-25 MED ORDER — PALONOSETRON HCL INJECTION 0.25 MG/5ML
0.2500 mg | Freq: Once | INTRAVENOUS | Status: AC
  Administered 2019-04-25: 0.25 mg via INTRAVENOUS

## 2019-04-25 MED ORDER — SODIUM CHLORIDE 0.9 % IV SOLN
Freq: Once | INTRAVENOUS | Status: AC
  Administered 2019-04-25: 09:00:00 via INTRAVENOUS
  Filled 2019-04-25: qty 250

## 2019-04-25 MED ORDER — PALONOSETRON HCL INJECTION 0.25 MG/5ML
INTRAVENOUS | Status: AC
  Filled 2019-04-25: qty 5

## 2019-04-25 MED ORDER — SODIUM CHLORIDE 0.9 % IV SOLN
200.0000 mg | Freq: Once | INTRAVENOUS | Status: AC
  Administered 2019-04-25: 200 mg via INTRAVENOUS
  Filled 2019-04-25: qty 8

## 2019-04-25 MED ORDER — SODIUM CHLORIDE 0.9% FLUSH
10.0000 mL | INTRAVENOUS | Status: DC | PRN
  Administered 2019-04-25: 10 mL
  Filled 2019-04-25: qty 10

## 2019-04-25 MED ORDER — SODIUM CHLORIDE 0.9 % IV SOLN
Freq: Once | INTRAVENOUS | Status: AC
  Administered 2019-04-25: 10:00:00 via INTRAVENOUS
  Filled 2019-04-25: qty 5

## 2019-04-25 MED ORDER — SODIUM CHLORIDE 0.9 % IV SOLN
475.0000 mg/m2 | Freq: Once | INTRAVENOUS | Status: AC
  Administered 2019-04-25: 11:00:00 1000 mg via INTRAVENOUS
  Filled 2019-04-25: qty 40

## 2019-04-25 MED ORDER — HEPARIN SOD (PORK) LOCK FLUSH 100 UNIT/ML IV SOLN
500.0000 [IU] | Freq: Once | INTRAVENOUS | Status: AC | PRN
  Administered 2019-04-25: 12:00:00 500 [IU]
  Filled 2019-04-25: qty 5

## 2019-04-25 MED ORDER — SODIUM CHLORIDE 0.9 % IV SOLN
520.0000 mg | Freq: Once | INTRAVENOUS | Status: AC
  Administered 2019-04-25: 12:00:00 520 mg via INTRAVENOUS
  Filled 2019-04-25: qty 52

## 2019-04-25 NOTE — Progress Notes (Signed)
[facility] [phone]   [phone]  OFFICE PROGRESS NOTE  Laurey Morale, MD [place] [address]  DIAGNOSIS: Stage IV (T2b, N2, M1c) presented with right middle lobe lung mass in addition to mediastinal lymphadenopathy as well as malignant right pleural effusion with pleural-based nodules and suspicious right hepatic lesion and the brain metastasis diagnosed in July 2020.  Molecular Biomarkers: [id number] (Exon 20 insertion) 0.2% Dacomitinib,Neratinib,Osimertinib  [id number] 0.1% None    PRIOR THERAPY: None  CURRENT THERAPY: Systemic chemotherapy with carboplatin for AUC of 5, Alimta 500 mg/M2 and Keytruda 200 mg IV every 3 weeks.  First dose April 04, 2019.  Status post 1 cycle.  INTERVAL HISTORY: Ethan Brown 68 y.o. male returns to the clinic today for follow-up visit.  The patient is feeling fine today with no concerning complaints.  He tolerated the first cycle of his treatment fairly well with no concerning adverse effects.  He denied having any nausea, vomiting, diarrhea or constipation.  He denied having any fever or chills.  He has no weight loss or night sweats.  He has no chest pain, shortness of breath but continues to have intermittent cough with no hemoptysis.  He continues to drain around 300-400 ml of pleural fluid every other day.  He is here today for evaluation before starting cycle #2.  MEDICAL HISTORY: Past Medical History:  Diagnosis Date   Borderline hypertension    Borderline systolic hypertension    Detached vitreous humor, left    Dyspnea    ED (erectile dysfunction) of organic origin    Hyperlipidemia    Lens subluxation, left    Lung cancer (HCC)    Migraine    Migraine headache    takes PRN Imitrex   Pseudophakia    TGA (transient global amnesia) 2017   Transient global amnesia     ALLERGIES:  has No Known Allergies.  MEDICATIONS:  Current  Outpatient Medications  Medication Sig Dispense Refill   cyanocobalamin (,VITAMIN B-12,) 1000 MCG/ML injection Wife is a Marine scientist and administers injection. Next injection due 7/51/0258.     folic acid (FOLVITE) 1 MG tablet Take 1 tablet (1 mg total) by mouth daily. 30 tablet 4   lidocaine-prilocaine (EMLA) cream Apply to the Port-A-Cath site 30-60 minutes before treatment 30 g 0   prochlorperazine (COMPAZINE) 10 MG tablet Take 1 tablet (10 mg total) by mouth every 6 (six) hours as needed for nausea or vomiting. 30 tablet 0   SUMAtriptan (IMITREX) 50 MG tablet Take 50 mg by mouth every 2 (two) hours as needed for migraine.      traMADol (ULTRAM) 50 MG tablet Take 1 tablet (50 mg total) by mouth every 6 (six) hours as needed for severe pain. 20 tablet 0   No current facility-administered medications for this visit.    Facility-Administered Medications Ordered in Other Visits  Medication Dose Route Frequency Provider Last Rate Last Dose   sodium chloride flush (NS) 0.9 % injection 10 mL  10 mL Intracatheter PRN Curt Bears, MD   10 mL at 04/25/19 0817    SURGICAL HISTORY:  Past Surgical History:  Procedure Laterality Date   CAROTID DOPPLERS  09/2017   Mild non-obstructive disease   CATARACT EXTRACTION, BILATERAL     cataract, left  2018   CHEST TUBE INSERTION Right 03/29/2019   Procedure: INSERTION PLEURAL DRAINAGE CATHETER;  Surgeon: [name], MD;  Location: [facility];  Service:  Thoracic;  Laterality: Right;   COLONOSCOPY  2016   clear, repeat in 10 yrs    Eye surgeries     , Lens attachment.  Vitrectomy   FEMORAL HERNIA REPAIR     HERNIA REPAIR     IR THORACENTESIS ASP PLEURAL SPACE W/IMG GUIDE  02/22/2019   IR THORACENTESIS ASP PLEURAL SPACE W/IMG GUIDE  03/13/2019   LUMBAR LAMINECTOMY     PLEURAL BIOPSY Right 03/29/2019   Procedure: PLEURAL BIOPSY;  Surgeon: [name], MD;  Location: [facility];  Service: Thoracic;  Laterality: Right;   PORTACATH  PLACEMENT N/A 03/29/2019   Procedure: INSERTION PORT-A-CATH;  Surgeon: [name], MD;  Location: [facility];  Service: Thoracic;  Laterality: N/A;   retinal attachment, right     sclearl buckle, right     victrectomy,right     VIDEO ASSISTED THORACOSCOPY Right 03/29/2019   Procedure: VIDEO ASSISTED THORACOSCOPY;  Surgeon: [name], MD;  Location: [facility];  Service: Thoracic;  Laterality: Right;    REVIEW OF SYSTEMS:  A comprehensive review of systems was negative except for: Respiratory: positive for cough   PHYSICAL EXAMINATION: General appearance: alert, cooperative and no distress Head: Normocephalic, without obvious abnormality, atraumatic Neck: no adenopathy, no JVD, supple, symmetrical, trachea midline and thyroid not enlarged, symmetric, no tenderness/mass/nodules Lymph nodes: Cervical, supraclavicular, and axillary nodes normal. Resp: clear to auscultation bilaterally Back: symmetric, no curvature. ROM normal. No CVA tenderness. Cardio: regular rate and rhythm, S1, S2 normal, no murmur, click, rub or gallop GI: soft, non-tender; bowel sounds normal; no masses,  no organomegaly Extremities: extremities normal, atraumatic, no cyanosis or edema  ECOG PERFORMANCE STATUS: 1 - Symptomatic but completely ambulatory  Blood pressure 127/78, pulse 75, temperature 98.7 F (37.1 C), temperature source Oral, resp. rate 18, height 5' 11" (1.803 m), weight 199 lb 3.2 oz (90.4 kg), SpO2 98 %.  LABORATORY DATA: Lab Results  Component Value Date   WBC 4.8 04/18/2019   HGB 13.0 04/18/2019   HCT 39.0 04/18/2019   MCV 94.9 04/18/2019   PLT 166 04/18/2019      Chemistry      Component Value Date/Time   NA 139 04/18/2019 1500   K 4.2 04/18/2019 1500   CL 107 04/18/2019 1500   CO2 26 04/18/2019 1500   BUN 26 (H) 04/18/2019 1500   CREATININE 1.12 04/18/2019 1500      Component Value Date/Time   CALCIUM 8.5 (L) 04/18/2019 1500   ALKPHOS 70 04/18/2019 1500   AST 18  04/18/2019 1500   ALT 24 04/18/2019 1500   BILITOT <0.2 (L) 04/18/2019 1500       RADIOGRAPHIC STUDIES: Dg Chest 2 View  Result Date: 04/21/2019 CLINICAL DATA:  Pleural effusion.  History of lung cancer. EXAM: CHEST - 2 VIEW COMPARISON:  Radiographs of March 30, 2019. FINDINGS: The heart size and mediastinal contours are within normal limits. Right subclavian Port-A-Cath is unchanged in position. No pneumothorax is noted. Left lung is clear. Right-sided chest tube is unchanged in position. Increased right basilar opacity is noted concerning for worsening atelectasis and associated pleural effusion. Stable right midlung scarring or subsegmental atelectasis is noted. The visualized skeletal structures are unremarkable. IMPRESSION: Stable position of right-sided chest tube without pneumothorax. Increased right basilar opacity is noted concerning for worsening atelectasis and associated mild pleural effusion. Electronically Signed   By: Marijo Conception M.D.   On: 04/21/2019 10:56   Dg Chest 2 View  Result Date: 03/28/2019 CLINICAL  DATA:  Pre-admission for right VATS and pleural biopsy EXAM: CHEST - 2 VIEW COMPARISON:  None. FINDINGS: Masslike widening of the right paratracheal stripe and nodular opacities in the right hilum and right lung compatible with masses seen on prior CT. Persistent right pleural effusion with fluid tracking in the minor fissure. Left lung is largely clear. Remaining aerated portions of the right lung are clear. No acute osseous or soft tissue abnormality. Right heart border is largely obscured by the right effusion. The left heart border is unremarkable. IMPRESSION: 1. Redemonstrated nodular masses in the right lung, mediastinum and hilum 2. Persistent right pleural effusion with fluid tracking in the minor fissure. Electronically Signed   By: Lovena Le M.D.   On: 03/28/2019 15:52   Mr Jeri Cos BP Contrast  Result Date: 03/31/2019 CLINICAL DATA:  Metastatic lung cancer.  Stereotactic radio surgery targeting EXAM: MRI HEAD WITHOUT AND WITH CONTRAST TECHNIQUE: Multiplanar, multiecho pulse sequences of the brain and surrounding structures were obtained without and with intravenous contrast. CONTRAST:  67m MULTIHANCE GADOBENATE DIMEGLUMINE 529 MG/ML IV SOLN COMPARISON:  MRI brain 03/27/2019 FINDINGS: Brain: SRS protocol using 3 Tesla MRI. Solitary metastatic deposit right parietal lobe measuring 6.2 mm appears unchanged from the prior study. Mild amount of internal hemorrhage in the lesion. No surrounding edema. No other enhancing lesions identified. Negative for acute infarct. Several small white matter hyperintensities bilaterally most compatible with chronic microvascular ischemia. Ventricle size and cerebral volume normal. No midline shift. Vascular: Normal arterial flow voids. Skull and upper cervical spine: Negative Sinuses/Orbits: Paranasal sinuses clear.  Bilateral cataract surgery Other: None IMPRESSION: 6.2 mm solitary metastatic deposit right parietal lobe with prior hemorrhage. No significant edema. No second lesion identified. Electronically Signed   By: CFranchot GalloM.D.   On: 03/31/2019 14:41   Mr BJeri CosWZWContrast  Result Date: 03/27/2019 CLINICAL DATA:  68year old male initial staging, lung cancer with malignant pleural effusion. EXAM: MRI HEAD WITHOUT AND WITH CONTRAST TECHNIQUE: Multiplanar, multiecho pulse sequences of the brain and surrounding structures were obtained without and with intravenous contrast. CONTRAST:  7.5 milliliters Gadavist COMPARISON:  PET-CT today reported separately. FINDINGS: Brain: There is a round 6-7 millimeter enhancing lesion at the right sensory strip on series 11, image 77 and series 13, image 6. No associated edema or mass effect. There is faint hemosiderin within the lesion on series 7, image 24. No other abnormal enhancement identified.  No dural thickening. No restricted diffusion to suggest acute infarction. No midline  shift, mass effect, ventriculomegaly, extra-axial collection or acute intracranial hemorrhage. Cervicomedullary junction and pituitary are within normal limits. Underlying largely normal for age gray and white matter signal throughout the brain. No cortical encephalomalacia, although there is a chronic microhemorrhage in the left parietal lobe on series 7, image 21. No other chronic blood products identified. Vascular: Major intracranial vascular flow voids are preserved. The major dural venous sinuses are enhancing and appear to be patent. Skull and upper cervical spine: Negative visible cervical spine. Negative visible spinal cord. Visualized bone marrow signal is within normal limits. Sinuses/Orbits: Postoperative changes to both globes, otherwise negative orbits. Trace paranasal sinus mucosal thickening. Other: Mastoids are clear. Visible internal auditory structures appear normal. Scalp and face soft tissues appear within normal limits. IMPRESSION: 1. Positive for solitary right parietal lobe brain metastasis, 6-7 mm and located at the sensory strip. Faint hemosiderin within the lesion, but no associated edema or mass effect. 2. Negative for age MRI appearance of the brain otherwise;  tiny nonspecific chronic microhemorrhage in the left parietal lobe. Electronically Signed   By: Genevie Ann M.D.   On: 03/27/2019 11:59   Nm Pet Image Initial (pi) Skull Base To Thigh  Result Date: 03/27/2019 CLINICAL DATA:  Initial treatment strategy for lung cancer. Malignant pleural effusion. EXAM: NUCLEAR MEDICINE PET SKULL BASE TO THIGH TECHNIQUE: 9.57 mCi F-18 FDG was injected intravenously. Full-ring PET imaging was performed from the skull base to thigh after the radiotracer. CT data was obtained and used for attenuation correction and anatomic localization. Fasting blood glucose: 98 mg/dl COMPARISON:  Chest CT 02/09/2019 FINDINGS: Mediastinal blood pool activity: SUV max 2.58 Liver activity: SUV max NA NECK: No  hypermetabolic lymph nodes in the neck. Incidental CT findings: none CHEST: Area of marked hypermetabolism and a somewhat rounded masslike lesion in the medial aspect of the right upper lobe possibly reflecting the patient's primary lung neoplasm. SUV max is 22.12. This lesion measures approximately 2.5 cm on image number 78. Second area of fairly significant hypermetabolism noted in the right lower lobe and possibly partly in the right middle lobe. This has an SUV max of 15.18. Extensive hypermetabolic pleural metastasis involving the right hemithorax. Pleural lesion anteriorly on image number 80 measures 10 mm and SUV max is 17.2. There are also numerous small pleural nodules along the major and minor fissures. No obvious pulmonary nodules. Extensive mediastinal and hilar lymphadenopathy. 18 mm pretracheal nodal lesion on image number 81 has an SUV max of 18.74. Subcarinal lesion measures 11 mm on image number 89 and has an SUV max of 18.68. Incidental CT findings: Persistent large right pleural effusion. ABDOMEN/PELVIS: There is a vague low-attenuation lesion in the liver measuring 2 cm. This is in segment 7 and is markedly hypermetabolic. The SUV max is 19.14. I am not sure if this is an intraparenchymal metastasis or a peritoneal surface metastasis. No other liver lesions are identified. Right-sided retroperitoneal lymph node just posterior to the IVC on image number 233 is hypermetabolic with SUV max of 00.76. Small lymph node anterior to the IVC on image number 156 measures 5 mm and SUV max is 3.43. No adrenal gland lesions. Incidental CT findings: Small left-sided bladder diverticulum. Enlarged prostate gland with median lobe hypertrophy impressing on the base of the bladder. SKELETON: No findings to suggest osseous metastatic disease. Incidental CT findings: none IMPRESSION: 1. Probable right upper lobe and right lower lobe/middle lobe lung cancer with extensive metastatic pleural disease and mediastinal  and hilar disease. There is also a large malignant right pleural effusion. 2. Evidence of disease below the diaphragm with a right hepatic lobe lesion and metastatic adenopathy in the abdomen. 3. No findings suspicious for osseous metastatic disease or pulmonary metastatic disease. Electronically Signed   By: Marijo Sanes M.D.   On: 03/27/2019 10:09   Dg Chest Port 1 View  Result Date: 03/30/2019 CLINICAL DATA:  68 year old male lung cancer with malignant pleural effusion. Postoperative day 1 right video-assisted thoracoscopy. Port-A-Cath placement, pleural biopsy and pleural drainage. EXAM: PORTABLE CHEST 1 VIEW COMPARISON:  03/29/2019 and earlier. FINDINGS: Portable AP upright view at 0632 hours. Stable right IJ central line. Stable right subclavian Port-A-Cath. Right chest tube remains in place. No pneumothorax. Stable lung volumes with mildly improved right lung base ventilation. Patchy residual infrahilar opacity. Stable cardiac size and mediastinal contours. Allowing for portable technique the left lung remains clear. Negative visible bowel gas pattern. IMPRESSION: 1. Stable lines and tubes including right chest tube. No pneumothorax. 2. Mildly  improved right lung base ventilation. Patchy residual infrahilar opacity. 3. No new cardiopulmonary abnormality. Electronically Signed   By: Genevie Ann M.D.   On: 03/30/2019 09:08   Dg Chest Port 1 View  Result Date: 03/29/2019 CLINICAL DATA:  Status post VATS. Status post porta catheter placement. EXAM: PORTABLE CHEST 1 VIEW COMPARISON:  03/28/2019. FINDINGS: Interval right subclavian porta catheter with its tip in the proximal superior vena cava. Interval right jugular catheter with its tip also in the proximal superior vena cava. No pneumothorax. Normal sized heart. Decreased right pleural fluid and right basilar atelectasis. Interval mild patchy density in the right lower lung zone. Clear left lung. Unremarkable bones. IMPRESSION: 1. Right jugular catheter tip  in the proximal superior vena cava without pneumothorax. 2. Right subclavian porta catheter tip in the proximal superior vena cava. 3. Decreased right pleural fluid and right basilar atelectasis. 4. Interval mild patchy atelectasis or pneumonia in the right lower lung zone. Electronically Signed   By: Claudie Revering M.D.   On: 03/29/2019 19:17   Dg Fluoro Guide Cv Line-no Report  Result Date: 03/29/2019 Fluoroscopy was utilized by the requesting physician.  No radiographic interpretation.    ASSESSMENT AND PLAN: This is a very pleasant 68 years old white male recently diagnosed with a stage IV (T2b, and 2, M1C) non-small cell lung cancer, adenocarcinoma with positive EGFR mutation in exon 20 (resistant mutation) diagnosed in July 2020 and presented with extensive disease involving the right upper lobe as well as the right middle and lower lobe with extensive pleural based metastasis as well as mediastinal and hilar disease with malignant pleural effusion as well as liver and brain metastasis. Unfortunately the patient has no actionable mutations based on the molecular studies by [employer].   The patient is currently undergoing systemic chemotherapy with carboplatin for AUC of 5, Alimta 500 mg/M2 and Keytruda 200 mg IV every 3 weeks status post 1 cycle. He tolerated the first cycle of his treatment well with no concerning adverse effects. I recommended for the patient to proceed with cycle #2 today as planned. He will come back for follow-up visit in 3 weeks for evaluation before starting cycle #3. For the recurrent right pleural effusion, the patient will continue with the drainage of the pleural fluid via the Pleurx catheter. He was advised to call immediately if he has any concerning symptoms in the interval. The patient voices understanding of current disease status and treatment options and is in agreement with the current care plan. All questions were answered. The patient knows to call the  clinic with any problems, questions or concerns. We can certainly see the patient much sooner if necessary.  I spent 10 minutes counseling the patient face to face. The total time spent in the appointment was 15 minutes.  Disclaimer: This note was dictated with voice recognition software. Similar sounding words can inadvertently be transcribed and may not be corrected upon review.

## 2019-04-25 NOTE — Patient Instructions (Signed)
[facility] Discharge Instructions for Patients Receiving Chemotherapy  Today you received the following chemotherapy agents: Keytruda, Alimta, Carboplatin   To help prevent nausea and vomiting after your treatment, we encourage you to take your nausea medication as directed.    If you develop nausea and vomiting that is not controlled by your nausea medication, call the clinic.   BELOW ARE SYMPTOMS THAT SHOULD BE REPORTED IMMEDIATELY:  *FEVER GREATER THAN 100.5 F  *CHILLS WITH OR WITHOUT FEVER  NAUSEA AND VOMITING THAT IS NOT CONTROLLED WITH YOUR NAUSEA MEDICATION  *UNUSUAL SHORTNESS OF BREATH  *UNUSUAL BRUISING OR BLEEDING  TENDERNESS IN MOUTH AND THROAT WITH OR WITHOUT PRESENCE OF ULCERS  *URINARY PROBLEMS  *BOWEL PROBLEMS  UNUSUAL RASH Items with * indicate a potential emergency and should be followed up as soon as possible.  Feel free to call the clinic should you have any questions or concerns. The clinic phone number is [phone].  Please show the [facility] at check-in to the Emergency Department and triage nurse.

## 2019-04-26 DIAGNOSIS — Z48813 Encounter for surgical aftercare following surgery on the respiratory system: Secondary | ICD-10-CM | POA: Diagnosis not present

## 2019-04-26 DIAGNOSIS — N400 Enlarged prostate without lower urinary tract symptoms: Secondary | ICD-10-CM | POA: Diagnosis not present

## 2019-04-26 DIAGNOSIS — G43909 Migraine, unspecified, not intractable, without status migrainosus: Secondary | ICD-10-CM | POA: Diagnosis not present

## 2019-04-26 DIAGNOSIS — J910 Malignant pleural effusion: Secondary | ICD-10-CM | POA: Diagnosis not present

## 2019-04-26 DIAGNOSIS — C7931 Secondary malignant neoplasm of brain: Secondary | ICD-10-CM | POA: Diagnosis not present

## 2019-04-26 DIAGNOSIS — C3411 Malignant neoplasm of upper lobe, right bronchus or lung: Secondary | ICD-10-CM | POA: Diagnosis not present

## 2019-04-27 ENCOUNTER — Telehealth: Payer: Self-pay | Admitting: Internal Medicine

## 2019-04-27 NOTE — Telephone Encounter (Signed)
Scheduled appt per 9/1 los - pt to get an updated schedule next visit. - appts added to cycles already on schedule.

## 2019-04-28 NOTE — Addendum Note (Signed)
Encounter addended by: Kary Kos, MD on: 04/28/2019 2:09 PM  Actions taken: Clinical Note Signed

## 2019-04-28 NOTE — Procedures (Signed)
  Name: Daron Stutz  MRN: [id number]  Date: 04/07/2019   DOB: [birth year]  Stereotactic Radiosurgery Operative Note  PRE-OPERATIVE DIAGNOSIS:  Solitary Brain Metastasis  POST-OPERATIVE DIAGNOSIS:  Solitary Brain Metastasis  PROCEDURE:  Stereotactic Radiosurgery  SURGEON:  Ziyanna Tolin P, MD  NARRATIVE: The patient underwent a radiation treatment planning session in the radiation oncology simulation suite under the care of the radiation oncology physician and physicist.  I participated closely in the radiation treatment planning afterwards. The patient underwent planning CT which was fused to 3T high resolution MRI with 1 mm axial slices.  These images were fused on the planning system.  We contoured the gross target volumes and subsequently expanded this to yield the Planning Target Volume. I actively participated in the planning process.  I helped to define and review the target contours and also the contours of the optic pathway, eyes, brainstem and selected nearby organs at risk.  All the dose constraints for critical structures were reviewed and compared to AAPM Task Group 101.  The prescription dose conformity was reviewed.  I approved the plan electronically.    Accordingly, [name] was brought to the TrueBeam stereotactic radiation treatment linac and placed in the custom immobilization mask.  The patient was aligned according to the IR fiducial markers with BrainLab Exactrac, then orthogonal x-rays were used in ExacTrac with the 6DOF robotic table and the shifts were made to align the patient  [name] received stereotactic radiosurgery uneventfully.    The detailed description of the procedure is recorded in the radiation oncology procedure note.  I was present for the duration of the procedure.  DISPOSITION:  Following delivery, the patient was transported to nursing in stable condition and monitored for possible acute effects to be discharged to home in  stable condition with follow-up in one month.  Irean Kendricks P, MD 04/28/2019 2:08 PM

## 2019-04-30 DIAGNOSIS — H43812 Vitreous degeneration, left eye: Secondary | ICD-10-CM | POA: Diagnosis not present

## 2019-04-30 DIAGNOSIS — N400 Enlarged prostate without lower urinary tract symptoms: Secondary | ICD-10-CM | POA: Diagnosis not present

## 2019-04-30 DIAGNOSIS — G454 Transient global amnesia: Secondary | ICD-10-CM | POA: Diagnosis not present

## 2019-04-30 DIAGNOSIS — C7931 Secondary malignant neoplasm of brain: Secondary | ICD-10-CM | POA: Diagnosis not present

## 2019-04-30 DIAGNOSIS — E538 Deficiency of other specified B group vitamins: Secondary | ICD-10-CM | POA: Diagnosis not present

## 2019-04-30 DIAGNOSIS — G629 Polyneuropathy, unspecified: Secondary | ICD-10-CM | POA: Diagnosis not present

## 2019-04-30 DIAGNOSIS — E785 Hyperlipidemia, unspecified: Secondary | ICD-10-CM | POA: Diagnosis not present

## 2019-04-30 DIAGNOSIS — G43909 Migraine, unspecified, not intractable, without status migrainosus: Secondary | ICD-10-CM | POA: Diagnosis not present

## 2019-04-30 DIAGNOSIS — J910 Malignant pleural effusion: Secondary | ICD-10-CM | POA: Diagnosis not present

## 2019-04-30 DIAGNOSIS — Z9181 History of falling: Secondary | ICD-10-CM | POA: Diagnosis not present

## 2019-04-30 DIAGNOSIS — N529 Male erectile dysfunction, unspecified: Secondary | ICD-10-CM | POA: Diagnosis not present

## 2019-04-30 DIAGNOSIS — Z79891 Long term (current) use of opiate analgesic: Secondary | ICD-10-CM | POA: Diagnosis not present

## 2019-04-30 DIAGNOSIS — Z1159 Encounter for screening for other viral diseases: Secondary | ICD-10-CM | POA: Diagnosis not present

## 2019-04-30 DIAGNOSIS — J9811 Atelectasis: Secondary | ICD-10-CM | POA: Diagnosis not present

## 2019-04-30 DIAGNOSIS — Z48813 Encounter for surgical aftercare following surgery on the respiratory system: Secondary | ICD-10-CM | POA: Diagnosis not present

## 2019-04-30 DIAGNOSIS — H33302 Unspecified retinal break, left eye: Secondary | ICD-10-CM | POA: Diagnosis not present

## 2019-04-30 DIAGNOSIS — R030 Elevated blood-pressure reading, without diagnosis of hypertension: Secondary | ICD-10-CM | POA: Diagnosis not present

## 2019-04-30 DIAGNOSIS — C3411 Malignant neoplasm of upper lobe, right bronchus or lung: Secondary | ICD-10-CM | POA: Diagnosis not present

## 2019-04-30 DIAGNOSIS — N323 Diverticulum of bladder: Secondary | ICD-10-CM | POA: Diagnosis not present

## 2019-04-30 DIAGNOSIS — F17210 Nicotine dependence, cigarettes, uncomplicated: Secondary | ICD-10-CM | POA: Diagnosis not present

## 2019-05-02 ENCOUNTER — Other Ambulatory Visit: Payer: Self-pay

## 2019-05-02 ENCOUNTER — Inpatient Hospital Stay: Payer: Medicare Other

## 2019-05-02 DIAGNOSIS — Z5111 Encounter for antineoplastic chemotherapy: Secondary | ICD-10-CM | POA: Diagnosis not present

## 2019-05-02 DIAGNOSIS — C787 Secondary malignant neoplasm of liver and intrahepatic bile duct: Secondary | ICD-10-CM | POA: Diagnosis not present

## 2019-05-02 DIAGNOSIS — C3491 Malignant neoplasm of unspecified part of right bronchus or lung: Secondary | ICD-10-CM

## 2019-05-02 DIAGNOSIS — C3411 Malignant neoplasm of upper lobe, right bronchus or lung: Secondary | ICD-10-CM | POA: Diagnosis not present

## 2019-05-02 DIAGNOSIS — C7931 Secondary malignant neoplasm of brain: Secondary | ICD-10-CM | POA: Diagnosis not present

## 2019-05-02 DIAGNOSIS — J910 Malignant pleural effusion: Secondary | ICD-10-CM | POA: Diagnosis not present

## 2019-05-02 DIAGNOSIS — C782 Secondary malignant neoplasm of pleura: Secondary | ICD-10-CM | POA: Diagnosis not present

## 2019-05-02 LAB — CMP (CANCER CENTER ONLY)
ALT: 38 U/L (ref 0–44)
AST: 26 U/L (ref 15–41)
Albumin: 3 g/dL — ABNORMAL LOW (ref 3.5–5.0)
Alkaline Phosphatase: 80 U/L (ref 38–126)
Anion gap: 7 (ref 5–15)
BUN: 23 mg/dL (ref 8–23)
CO2: 27 mmol/L (ref 22–32)
Calcium: 8.9 mg/dL (ref 8.9–10.3)
Chloride: 105 mmol/L (ref 98–111)
Creatinine: 1.13 mg/dL (ref 0.61–1.24)
GFR, Est AFR Am: >60 mL/min (ref >60)
GFR, Estimated: >60 mL/min (ref >60)
Glucose, Bld: 92 mg/dL (ref 70–99)
Potassium: 5.2 mmol/L — ABNORMAL HIGH (ref 3.5–5.1)
Sodium: 139 mmol/L (ref 135–145)
Total Bilirubin: 0.2 mg/dL — ABNORMAL LOW (ref 0.3–1.2)
Total Protein: 6.3 g/dL — ABNORMAL LOW (ref 6.5–8.1)

## 2019-05-02 LAB — CBC WITH DIFFERENTIAL (CANCER CENTER ONLY)
Abs Immature Granulocytes: 0.01 10*3/uL (ref 0.00–0.07)
Basophils Absolute: 0 10*3/uL (ref 0.0–0.1)
Basophils Relative: 2 %
Eosinophils Absolute: 0 10*3/uL (ref 0.0–0.5)
Eosinophils Relative: 2 %
HCT: 37.7 % — ABNORMAL LOW (ref 39.0–52.0)
Hemoglobin: 12.6 g/dL — ABNORMAL LOW (ref 13.0–17.0)
Immature Granulocytes: 1 %
Lymphocytes Relative: 37 %
Lymphs Abs: 0.7 10*3/uL (ref 0.7–4.0)
MCH: 31.7 pg (ref 26.0–34.0)
MCHC: 33.4 g/dL (ref 30.0–36.0)
MCV: 94.7 fL (ref 80.0–100.0)
Monocytes Absolute: 0.3 10*3/uL (ref 0.1–1.0)
Monocytes Relative: 17 %
Neutro Abs: 0.7 10*3/uL — ABNORMAL LOW (ref 1.7–7.7)
Neutrophils Relative %: 41 %
Platelet Count: 163 10*3/uL (ref 150–400)
RBC: 3.98 MIL/uL — ABNORMAL LOW (ref 4.22–5.81)
RDW: 12.5 % (ref 11.5–15.5)
WBC Count: 1.8 10*3/uL — ABNORMAL LOW (ref 4.0–10.5)
nRBC: 0 % (ref 0.0–0.2)

## 2019-05-04 DIAGNOSIS — C3411 Malignant neoplasm of upper lobe, right bronchus or lung: Secondary | ICD-10-CM | POA: Diagnosis not present

## 2019-05-04 DIAGNOSIS — Z23 Encounter for immunization: Secondary | ICD-10-CM | POA: Diagnosis not present

## 2019-05-04 DIAGNOSIS — N400 Enlarged prostate without lower urinary tract symptoms: Secondary | ICD-10-CM | POA: Diagnosis not present

## 2019-05-04 DIAGNOSIS — J910 Malignant pleural effusion: Secondary | ICD-10-CM | POA: Diagnosis not present

## 2019-05-04 DIAGNOSIS — C7931 Secondary malignant neoplasm of brain: Secondary | ICD-10-CM | POA: Diagnosis not present

## 2019-05-04 DIAGNOSIS — G43909 Migraine, unspecified, not intractable, without status migrainosus: Secondary | ICD-10-CM | POA: Diagnosis not present

## 2019-05-04 DIAGNOSIS — Z48813 Encounter for surgical aftercare following surgery on the respiratory system: Secondary | ICD-10-CM | POA: Diagnosis not present

## 2019-05-09 ENCOUNTER — Other Ambulatory Visit: Payer: Self-pay

## 2019-05-09 ENCOUNTER — Encounter: Payer: Self-pay | Admitting: Internal Medicine

## 2019-05-09 ENCOUNTER — Inpatient Hospital Stay: Payer: Medicare Other

## 2019-05-09 DIAGNOSIS — C787 Secondary malignant neoplasm of liver and intrahepatic bile duct: Secondary | ICD-10-CM | POA: Diagnosis not present

## 2019-05-09 DIAGNOSIS — C782 Secondary malignant neoplasm of pleura: Secondary | ICD-10-CM | POA: Diagnosis not present

## 2019-05-09 DIAGNOSIS — C3491 Malignant neoplasm of unspecified part of right bronchus or lung: Secondary | ICD-10-CM

## 2019-05-09 DIAGNOSIS — C7931 Secondary malignant neoplasm of brain: Secondary | ICD-10-CM | POA: Diagnosis not present

## 2019-05-09 DIAGNOSIS — C3411 Malignant neoplasm of upper lobe, right bronchus or lung: Secondary | ICD-10-CM | POA: Diagnosis not present

## 2019-05-09 DIAGNOSIS — J910 Malignant pleural effusion: Secondary | ICD-10-CM | POA: Diagnosis not present

## 2019-05-09 DIAGNOSIS — Z5111 Encounter for antineoplastic chemotherapy: Secondary | ICD-10-CM | POA: Diagnosis not present

## 2019-05-09 LAB — CBC WITH DIFFERENTIAL (CANCER CENTER ONLY)
Abs Immature Granulocytes: 0.01 10*3/uL (ref 0.00–0.07)
Basophils Absolute: 0 10*3/uL (ref 0.0–0.1)
Basophils Relative: 0 %
Eosinophils Absolute: 0.1 10*3/uL (ref 0.0–0.5)
Eosinophils Relative: 1 %
HCT: 36.6 % — ABNORMAL LOW (ref 39.0–52.0)
Hemoglobin: 12.1 g/dL — ABNORMAL LOW (ref 13.0–17.0)
Immature Granulocytes: 0 %
Lymphocytes Relative: 20 %
Lymphs Abs: 0.7 10*3/uL (ref 0.7–4.0)
MCH: 31.6 pg (ref 26.0–34.0)
MCHC: 33.1 g/dL (ref 30.0–36.0)
MCV: 95.6 fL (ref 80.0–100.0)
Monocytes Absolute: 0.5 10*3/uL (ref 0.1–1.0)
Monocytes Relative: 12 %
Neutro Abs: 2.4 10*3/uL (ref 1.7–7.7)
Neutrophils Relative %: 67 %
Platelet Count: 160 10*3/uL (ref 150–400)
RBC: 3.83 MIL/uL — ABNORMAL LOW (ref 4.22–5.81)
RDW: 13.1 % (ref 11.5–15.5)
WBC Count: 3.7 10*3/uL — ABNORMAL LOW (ref 4.0–10.5)
nRBC: 0 % (ref 0.0–0.2)

## 2019-05-09 LAB — CMP (CANCER CENTER ONLY)
ALT: 27 U/L (ref 0–44)
AST: 21 U/L (ref 15–41)
Albumin: 3.1 g/dL — ABNORMAL LOW (ref 3.5–5.0)
Alkaline Phosphatase: 82 U/L (ref 38–126)
Anion gap: 6 (ref 5–15)
BUN: 20 mg/dL (ref 8–23)
CO2: 29 mmol/L (ref 22–32)
Calcium: 8.6 mg/dL — ABNORMAL LOW (ref 8.9–10.3)
Chloride: 106 mmol/L (ref 98–111)
Creatinine: 1.14 mg/dL (ref 0.61–1.24)
GFR, Est AFR Am: >60 mL/min (ref >60)
GFR, Estimated: >60 mL/min (ref >60)
Glucose, Bld: 92 mg/dL (ref 70–99)
Potassium: 4.3 mmol/L (ref 3.5–5.1)
Sodium: 141 mmol/L (ref 135–145)
Total Bilirubin: <0.2 mg/dL — ABNORMAL LOW (ref 0.3–1.2)
Total Protein: 6.3 g/dL — ABNORMAL LOW (ref 6.5–8.1)

## 2019-05-10 DIAGNOSIS — C7931 Secondary malignant neoplasm of brain: Secondary | ICD-10-CM | POA: Diagnosis not present

## 2019-05-10 DIAGNOSIS — Z48813 Encounter for surgical aftercare following surgery on the respiratory system: Secondary | ICD-10-CM | POA: Diagnosis not present

## 2019-05-10 DIAGNOSIS — G43909 Migraine, unspecified, not intractable, without status migrainosus: Secondary | ICD-10-CM | POA: Diagnosis not present

## 2019-05-10 DIAGNOSIS — J910 Malignant pleural effusion: Secondary | ICD-10-CM | POA: Diagnosis not present

## 2019-05-10 DIAGNOSIS — C3411 Malignant neoplasm of upper lobe, right bronchus or lung: Secondary | ICD-10-CM | POA: Diagnosis not present

## 2019-05-10 DIAGNOSIS — N400 Enlarged prostate without lower urinary tract symptoms: Secondary | ICD-10-CM | POA: Diagnosis not present

## 2019-05-11 ENCOUNTER — Encounter (HOSPITAL_COMMUNITY): Payer: Self-pay | Admitting: Internal Medicine

## 2019-05-16 ENCOUNTER — Inpatient Hospital Stay: Payer: Medicare Other

## 2019-05-16 ENCOUNTER — Other Ambulatory Visit: Payer: Self-pay

## 2019-05-16 ENCOUNTER — Other Ambulatory Visit: Payer: Self-pay | Admitting: Internal Medicine

## 2019-05-16 ENCOUNTER — Inpatient Hospital Stay (HOSPITAL_BASED_OUTPATIENT_CLINIC_OR_DEPARTMENT_OTHER): Payer: Medicare Other | Admitting: Internal Medicine

## 2019-05-16 ENCOUNTER — Encounter: Payer: Self-pay | Admitting: Internal Medicine

## 2019-05-16 VITALS — BP 147/89 | HR 87 | Temp 98.3°F | Resp 16 | Ht 71.0 in | Wt 201.5 lb

## 2019-05-16 DIAGNOSIS — C7931 Secondary malignant neoplasm of brain: Secondary | ICD-10-CM

## 2019-05-16 DIAGNOSIS — C3491 Malignant neoplasm of unspecified part of right bronchus or lung: Secondary | ICD-10-CM

## 2019-05-16 DIAGNOSIS — Z95828 Presence of other vascular implants and grafts: Secondary | ICD-10-CM

## 2019-05-16 DIAGNOSIS — Z5111 Encounter for antineoplastic chemotherapy: Secondary | ICD-10-CM

## 2019-05-16 DIAGNOSIS — C782 Secondary malignant neoplasm of pleura: Secondary | ICD-10-CM | POA: Diagnosis not present

## 2019-05-16 DIAGNOSIS — C787 Secondary malignant neoplasm of liver and intrahepatic bile duct: Secondary | ICD-10-CM | POA: Diagnosis not present

## 2019-05-16 DIAGNOSIS — R5382 Chronic fatigue, unspecified: Secondary | ICD-10-CM

## 2019-05-16 DIAGNOSIS — J910 Malignant pleural effusion: Secondary | ICD-10-CM | POA: Diagnosis not present

## 2019-05-16 DIAGNOSIS — Z5112 Encounter for antineoplastic immunotherapy: Secondary | ICD-10-CM | POA: Diagnosis not present

## 2019-05-16 DIAGNOSIS — C3411 Malignant neoplasm of upper lobe, right bronchus or lung: Secondary | ICD-10-CM | POA: Diagnosis not present

## 2019-05-16 DIAGNOSIS — C3490 Malignant neoplasm of unspecified part of unspecified bronchus or lung: Secondary | ICD-10-CM | POA: Diagnosis not present

## 2019-05-16 LAB — CBC WITH DIFFERENTIAL (CANCER CENTER ONLY)
Abs Immature Granulocytes: 0.01 10*3/uL (ref 0.00–0.07)
Basophils Absolute: 0 10*3/uL (ref 0.0–0.1)
Basophils Relative: 1 %
Eosinophils Absolute: 0 10*3/uL (ref 0.0–0.5)
Eosinophils Relative: 1 %
HCT: 34.9 % — ABNORMAL LOW (ref 39.0–52.0)
Hemoglobin: 11.8 g/dL — ABNORMAL LOW (ref 13.0–17.0)
Immature Granulocytes: 0 %
Lymphocytes Relative: 19 %
Lymphs Abs: 0.7 10*3/uL (ref 0.7–4.0)
MCH: 31.9 pg (ref 26.0–34.0)
MCHC: 33.8 g/dL (ref 30.0–36.0)
MCV: 94.3 fL (ref 80.0–100.0)
Monocytes Absolute: 0.4 10*3/uL (ref 0.1–1.0)
Monocytes Relative: 10 %
Neutro Abs: 2.7 10*3/uL (ref 1.7–7.7)
Neutrophils Relative %: 69 %
Platelet Count: 269 10*3/uL (ref 150–400)
RBC: 3.7 MIL/uL — ABNORMAL LOW (ref 4.22–5.81)
RDW: 14 % (ref 11.5–15.5)
WBC Count: 3.9 10*3/uL — ABNORMAL LOW (ref 4.0–10.5)
nRBC: 0 % (ref 0.0–0.2)

## 2019-05-16 LAB — CMP (CANCER CENTER ONLY)
ALT: 24 U/L (ref 0–44)
AST: 26 U/L (ref 15–41)
Albumin: 3.1 g/dL — ABNORMAL LOW (ref 3.5–5.0)
Alkaline Phosphatase: 81 U/L (ref 38–126)
Anion gap: 7 (ref 5–15)
BUN: 15 mg/dL (ref 8–23)
CO2: 26 mmol/L (ref 22–32)
Calcium: 8.7 mg/dL — ABNORMAL LOW (ref 8.9–10.3)
Chloride: 107 mmol/L (ref 98–111)
Creatinine: 0.93 mg/dL (ref 0.61–1.24)
GFR, Est AFR Am: >60 mL/min (ref >60)
GFR, Estimated: >60 mL/min (ref >60)
Glucose, Bld: 98 mg/dL (ref 70–99)
Potassium: 4.2 mmol/L (ref 3.5–5.1)
Sodium: 140 mmol/L (ref 135–145)
Total Bilirubin: 0.3 mg/dL (ref 0.3–1.2)
Total Protein: 6.3 g/dL — ABNORMAL LOW (ref 6.5–8.1)

## 2019-05-16 LAB — TSH: TSH: 0.627 u[IU]/mL (ref 0.320–4.118)

## 2019-05-16 MED ORDER — SODIUM CHLORIDE 0.9 % IV SOLN
Freq: Once | INTRAVENOUS | Status: AC
  Administered 2019-05-16: 11:00:00 via INTRAVENOUS
  Filled 2019-05-16: qty 250

## 2019-05-16 MED ORDER — BENZONATATE 200 MG PO CAPS: 200.0000 mg | ORAL_CAPSULE | Freq: Three times a day (TID) | ORAL | 0 refills | Status: DC | PRN

## 2019-05-16 MED ORDER — SODIUM CHLORIDE 0.9 % IV SOLN
200.0000 mg | Freq: Once | INTRAVENOUS | Status: AC
  Administered 2019-05-16: 200 mg via INTRAVENOUS
  Filled 2019-05-16: qty 8

## 2019-05-16 MED ORDER — SODIUM CHLORIDE 0.9 % IV SOLN
567.5000 mg | Freq: Once | INTRAVENOUS | Status: AC
  Administered 2019-05-16: 570 mg via INTRAVENOUS
  Filled 2019-05-16: qty 57

## 2019-05-16 MED ORDER — PALONOSETRON HCL INJECTION 0.25 MG/5ML
INTRAVENOUS | Status: AC
  Filled 2019-05-16: qty 5

## 2019-05-16 MED ORDER — HEPARIN SOD (PORK) LOCK FLUSH 100 UNIT/ML IV SOLN
500.0000 [IU] | Freq: Once | INTRAVENOUS | Status: AC | PRN
  Administered 2019-05-16: 500 [IU]
  Filled 2019-05-16: qty 5

## 2019-05-16 MED ORDER — SODIUM CHLORIDE 0.9 % IV SOLN
475.0000 mg/m2 | Freq: Once | INTRAVENOUS | Status: AC
  Administered 2019-05-16: 1000 mg via INTRAVENOUS
  Filled 2019-05-16: qty 40

## 2019-05-16 MED ORDER — SODIUM CHLORIDE 0.9 % IV SOLN
Freq: Once | INTRAVENOUS | Status: AC
  Administered 2019-05-16: 12:00:00 via INTRAVENOUS
  Filled 2019-05-16: qty 5

## 2019-05-16 MED ORDER — PALONOSETRON HCL INJECTION 0.25 MG/5ML
0.2500 mg | Freq: Once | INTRAVENOUS | Status: AC
  Administered 2019-05-16: 12:00:00 0.25 mg via INTRAVENOUS

## 2019-05-16 MED ORDER — SODIUM CHLORIDE 0.9% FLUSH
10.0000 mL | INTRAVENOUS | Status: DC | PRN
  Administered 2019-05-16: 10 mL
  Filled 2019-05-16: qty 10

## 2019-05-16 MED ORDER — SODIUM CHLORIDE 0.9% FLUSH
10.0000 mL | INTRAVENOUS | Status: DC | PRN
  Administered 2019-05-16: 10:00:00 10 mL
  Filled 2019-05-16: qty 10

## 2019-05-16 NOTE — Patient Instructions (Signed)
[facility] Discharge Instructions for Patients Receiving Chemotherapy  Today you received the following chemotherapy agents:  Pembrolizumab, pemetrexed, carboplatin  To help prevent nausea and vomiting after your treatment, we encourage you to take your nausea medication as prescribed.   If you develop nausea and vomiting that is not controlled by your nausea medication, call the clinic.   BELOW ARE SYMPTOMS THAT SHOULD BE REPORTED IMMEDIATELY:  *FEVER GREATER THAN 100.5 F  *CHILLS WITH OR WITHOUT FEVER  NAUSEA AND VOMITING THAT IS NOT CONTROLLED WITH YOUR NAUSEA MEDICATION  *UNUSUAL SHORTNESS OF BREATH  *UNUSUAL BRUISING OR BLEEDING  TENDERNESS IN MOUTH AND THROAT WITH OR WITHOUT PRESENCE OF ULCERS  *URINARY PROBLEMS  *BOWEL PROBLEMS  UNUSUAL RASH Items with * indicate a potential emergency and should be followed up as soon as possible.  Feel free to call the clinic should you have any questions or concerns. The clinic phone number is [phone].  Please show the [facility] at check-in to the Emergency Department and triage nurse.

## 2019-05-16 NOTE — Progress Notes (Signed)
[place] [phone]   [phone]  OFFICE PROGRESS NOTE  Laurey Morale, MD [place] [address]  DIAGNOSIS: Stage IV (T2b, N2, M1c) presented with right middle lobe lung mass in addition to mediastinal lymphadenopathy as well as malignant right pleural effusion with pleural-based nodules and suspicious right hepatic lesion and the brain metastasis diagnosed in July 2020.  Molecular Biomarkers: [id number] (Exon 20 insertion) 0.2% Dacomitinib,Neratinib,Osimertinib  [id number] 0.1% None    PRIOR THERAPY: None  CURRENT THERAPY: Systemic chemotherapy with carboplatin for AUC of 5, Alimta 500 mg/M2 and Keytruda 200 mg IV every 3 weeks.  First dose April 04, 2019.  Status post 2 cycles .  INTERVAL HISTORY: Ethan Brown 68 y.o. male returns to the clinic today for follow-up visit.  The patient is feeling fine today with no concerning complaints except for the persistent cough.  He continues to have drainage from the Pleurx catheter around 250 mL every 2 days.  It is bloody at times.  He denied having any current chest pain, shortness of breath or hemoptysis.  He denied having any fever or chills.  He has no nausea, vomiting, diarrhea or constipation.  He continues to tolerate his treatment fairly well.  He is here today for evaluation before starting cycle #3.  MEDICAL HISTORY: Past Medical History:  Diagnosis Date  . Borderline hypertension   . Borderline systolic hypertension   . Detached vitreous humor, left   . Dyspnea   . ED (erectile dysfunction) of organic origin   . Hyperlipidemia   . Lens subluxation, left   . Lung cancer ([place])   . Migraine   . Migraine headache    takes PRN Imitrex  . Pseudophakia   . TGA (transient global amnesia) 2017  . Transient global amnesia     ALLERGIES:  has No Known Allergies.  MEDICATIONS:  Current Outpatient Medications  Medication Sig Dispense Refill  .  cyanocobalamin (,VITAMIN B-12,) 1000 MCG/ML injection Wife is a Marine scientist and administers injection. Next injection due 04/06/2019.    . folic acid (FOLVITE) 1 MG tablet Take 1 tablet (1 mg total) by mouth daily. 30 tablet 4  . lidocaine-prilocaine (EMLA) cream Apply to the Port-A-Cath site 30-60 minutes before treatment 30 g 0  . prochlorperazine (COMPAZINE) 10 MG tablet Take 1 tablet (10 mg total) by mouth every 6 (six) hours as needed for nausea or vomiting. 30 tablet 0  . SUMAtriptan (IMITREX) 50 MG tablet Take 50 mg by mouth every 2 (two) hours as needed for migraine.     . traMADol (ULTRAM) 50 MG tablet Take 1 tablet (50 mg total) by mouth every 6 (six) hours as needed for severe pain. 20 tablet 0   No current facility-administered medications for this visit.    Facility-Administered Medications Ordered in Other Visits  Medication Dose Route Frequency Provider Last Rate Last Dose  . sodium chloride flush (NS) 0.9 % injection 10 mL  10 mL Intracatheter PRN Curt Bears, MD   10 mL at 05/16/19 1017    SURGICAL HISTORY:  Past Surgical History:  Procedure Laterality Date  . CAROTID DOPPLERS  09/2017   Mild non-obstructive disease  . CATARACT EXTRACTION, BILATERAL    . cataract, left  2018  . CHEST TUBE INSERTION Right 03/29/2019   Procedure: INSERTION PLEURAL DRAINAGE CATHETER;  Surgeon: [name], MD;  Location: [facility];  Service: Thoracic;  Laterality: Right;  . COLONOSCOPY  2016  clear, repeat in 10 yrs   . Eye surgeries     , Lens attachment.  Vitrectomy  . FEMORAL HERNIA REPAIR    . HERNIA REPAIR    . IR THORACENTESIS ASP PLEURAL SPACE W/IMG GUIDE  02/22/2019  . IR THORACENTESIS ASP PLEURAL SPACE W/IMG GUIDE  03/13/2019  . LUMBAR LAMINECTOMY    . PLEURAL BIOPSY Right 03/29/2019   Procedure: PLEURAL BIOPSY;  Surgeon: [name], MD;  Location: [facility];  Service: Thoracic;  Laterality: Right;  . PORTACATH PLACEMENT N/A 03/29/2019   Procedure: INSERTION PORT-A-CATH;   Surgeon: [name], MD;  Location: [facility];  Service: Thoracic;  Laterality: N/A;  . retinal attachment, right    . sclearl buckle, right    . victrectomy,right    . VIDEO ASSISTED THORACOSCOPY Right 03/29/2019   Procedure: VIDEO ASSISTED THORACOSCOPY;  Surgeon: [name], MD;  Location: [facility] OR;  Service: Thoracic;  Laterality: Right;    REVIEW OF SYSTEMS:  A comprehensive review of systems was negative except for: Respiratory: positive for cough   PHYSICAL EXAMINATION: General appearance: alert, cooperative and no distress Head: Normocephalic, without obvious abnormality, atraumatic Neck: no adenopathy, no JVD, supple, symmetrical, trachea midline and thyroid not enlarged, symmetric, no tenderness/mass/nodules Lymph nodes: Cervical, supraclavicular, and axillary nodes normal. Resp: clear to auscultation bilaterally Back: symmetric, no curvature. ROM normal. No CVA tenderness. Cardio: regular rate and rhythm, S1, S2 normal, no murmur, click, rub or gallop GI: soft, non-tender; bowel sounds normal; no masses,  no organomegaly Extremities: extremities normal, atraumatic, no cyanosis or edema  ECOG PERFORMANCE STATUS: 1 - Symptomatic but completely ambulatory  Blood pressure (!) 147/89, pulse 87, temperature 98.3 F (36.8 C), temperature source Temporal, resp. rate 16, height _0  (1.803 m), weight 201 lb 8 oz (91.4 kg), SpO2 98 %.  LABORATORY DATA: Lab Results  Component Value Date   WBC 3.7 (L) 05/09/2019   HGB 12.1 (L) 05/09/2019   HCT 36.6 (L) 05/09/2019   MCV 95.6 05/09/2019   PLT 160 05/09/2019      Chemistry      Component Value Date/Time   NA 141 05/09/2019 1522   K 4.3 05/09/2019 1522   CL 106 05/09/2019 1522   CO2 29 05/09/2019 1522   BUN 20 05/09/2019 1522   CREATININE 1.14 05/09/2019 1522      Component Value Date/Time   CALCIUM 8.6 (L) 05/09/2019 1522   ALKPHOS 82 05/09/2019 1522   AST 21 05/09/2019 1522   ALT 27 05/09/2019 1522    BILITOT <0.2 (L) 05/09/2019 1522       RADIOGRAPHIC STUDIES: Dg Chest 2 View  Result Date: 04/21/2019 CLINICAL DATA:  Pleural effusion.  History of lung cancer. EXAM: CHEST - 2 VIEW COMPARISON:  Radiographs of March 30, 2019. FINDINGS: The heart size and mediastinal contours are within normal limits. Right subclavian Port-A-Cath is unchanged in position. No pneumothorax is noted. Left lung is clear. Right-sided chest tube is unchanged in position. Increased right basilar opacity is noted concerning for worsening atelectasis and associated pleural effusion. Stable right midlung scarring or subsegmental atelectasis is noted. The visualized skeletal structures are unremarkable. IMPRESSION: Stable position of right-sided chest tube without pneumothorax. Increased right basilar opacity is noted concerning for worsening atelectasis and associated mild pleural effusion. Electronically Signed   By: Marijo Conception M.D.   On: 04/21/2019 10:56    ASSESSMENT AND PLAN: This is a very pleasant 68 years old white male recently diagnosed with  a stage IV (T2b, and 2, M1C) non-small cell lung cancer, adenocarcinoma with positive EGFR mutation in exon 20 (resistant mutation) diagnosed in July 2020 and presented with extensive disease involving the right upper lobe as well as the right middle and lower lobe with extensive pleural based metastasis as well as mediastinal and hilar disease with malignant pleural effusion as well as liver and brain metastasis. Unfortunately the patient has no actionable mutations based on the molecular studies by [facility] [employer].   The patient is currently undergoing systemic chemotherapy with carboplatin for AUC of 5, Alimta 500 mg/M2 and Keytruda 200 mg IV every 3 weeks status post 2 cycles. He continues to tolerate this treatment well with no concerning adverse effects. I recommended for the patient to proceed with cycle #3 today as planned. I will see him back for follow-up visit in 3  weeks for evaluation with repeat CT scan of the chest, abdomen pelvis for restaging of his disease. For the recurrent right pleural effusion, the patient will continue with the drainage of the pleural fluid via the Pleurx catheter. For the dry cough, I will give the patient prescription for Tessalon capsules to be used 3 times a day as needed. He was advised to call immediately if he has any concerning symptoms in the interval. The patient voices understanding of current disease status and treatment options and is in agreement with the current care plan. All questions were answered. The patient knows to call the clinic with any problems, questions or concerns. We can certainly see the patient much sooner if necessary.  I spent 10 minutes counseling the patient face to face. The total time spent in the appointment was 15 minutes.  Disclaimer: This note was dictated with voice recognition software. Similar sounding words can inadvertently be transcribed and may not be corrected upon review.

## 2019-05-17 ENCOUNTER — Telehealth: Payer: Self-pay | Admitting: Urology

## 2019-05-17 NOTE — Telephone Encounter (Signed)
Radiation Oncology         [phone] ________________________________  Name: Ethan Brown MRN: [id number]  Date: 05/17/2019  DOB: 06/10/1951  Post Treatment Note  CC: Laurey Morale, MD  No ref. provider found  Diagnosis:   68 yo man with a solitary 6.2 mm right parietal brain metastasis from Stage IV (T2b, N2, M1c) adenocarcinoma of the right middle lobe of the lung.     Interval Since Last Radiation:  6 weeks   04/07/19: SRS to a 6 mm  Right parietal target treated to a prescription dose of 20 Gy in a single fraction.    Narrative:  I spoke with the patient to conduct his routine scheduled 1 month follow up visit via telephone to spare the patient unnecessary potential exposure in the healthcare setting during the current COVID-19 pandemic.  The patient was notified in advance and gave permission to proceed with this visit format.   He tolerated radiation treatment relatively well without any adverse side effects.                          On review of systems, the patient states that he is doing very well overall.  He is currently without complaints and specifically denies headaches, changes in auditory or visual acuity, nausea, vomiting, dizziness, imbalance, tremors or seizure activity.  He has not had recent fevers, chills or night sweats.  ALLERGIES:  has No Known Allergies.  Meds: Current Outpatient Medications  Medication Sig Dispense Refill  . benzonatate (TESSALON) 200 MG capsule Take 1 capsule (200 mg total) by mouth 3 (three) times daily as needed for cough. 30 capsule 0  . cyanocobalamin (,VITAMIN B-12,) 1000 MCG/ML injection Wife is a Marine scientist and administers injection. Next injection due 04/06/2019.    . folic acid (FOLVITE) 1 MG tablet Take 1 tablet (1 mg total) by mouth daily. 30 tablet 4  . lidocaine-prilocaine (EMLA) cream Apply to the Port-A-Cath site 30-60 minutes before treatment 30 g 0  . prochlorperazine (COMPAZINE) 10 MG tablet Take 1 tablet (10 mg total)  by mouth every 6 (six) hours as needed for nausea or vomiting. 30 tablet 0  . SUMAtriptan (IMITREX) 50 MG tablet Take 50 mg by mouth every 2 (two) hours as needed for migraine.     . traMADol (ULTRAM) 50 MG tablet Take 1 tablet (50 mg total) by mouth every 6 (six) hours as needed for severe pain. 20 tablet 0   No current facility-administered medications for this visit.     Physical Findings: Unable to assess due to telephone follow-up visit format.  Lab Findings: Lab Results  Component Value Date   WBC 3.9 (L) 05/16/2019   HGB 11.8 (L) 05/16/2019   HCT 34.9 (L) 05/16/2019   MCV 94.3 05/16/2019   PLT 269 05/16/2019     Radiographic Findings: Dg Chest 2 View  Result Date: 04/21/2019 CLINICAL DATA:  Pleural effusion.  History of lung cancer. EXAM: CHEST - 2 VIEW COMPARISON:  Radiographs of March 30, 2019. FINDINGS: The heart size and mediastinal contours are within normal limits. Right subclavian Port-A-Cath is unchanged in position. No pneumothorax is noted. Left lung is clear. Right-sided chest tube is unchanged in position. Increased right basilar opacity is noted concerning for worsening atelectasis and associated pleural effusion. Stable right midlung scarring or subsegmental atelectasis is noted. The visualized skeletal structures are unremarkable. IMPRESSION: Stable position of right-sided chest tube without pneumothorax. Increased right basilar opacity  is noted concerning for worsening atelectasis and associated mild pleural effusion. Electronically Signed   By: Marijo Conception M.D.   On: 04/21/2019 10:56    Impression/Plan: 1. 68 yo man with a solitary 6.2 mm right parietal brain metastasis from Stage IV (T2b, N2, M1c) adenocarcinoma of the right middle lobe of the lung.    The patient appears to be recovering well from the effects of his recent [facility] treatment.  He is currently without complaints.  We discussed the plan to obtain a follow-up MRI brain scan in approximately 2 months  to assess his response to treatment.  His images will be reviewed in the multidisciplinary brain and spine conference and we will plan to follow-up by phone thereafter to review results and recommendations.  If this scan is stable, we will proceed with serial MRI brain scans every 3 months going forward for the first 2 years and then on a six-month basis thereafter.  He appears to have a good understanding and is comfortable and in agreement with the stated plan.  He knows to call at anytime in the interim with any questions or concerns related to his previous radiotherapy.    Nicholos Johns, PA-C

## 2019-05-22 ENCOUNTER — Other Ambulatory Visit: Payer: Self-pay | Admitting: Thoracic Surgery (Cardiothoracic Vascular Surgery)

## 2019-05-22 DIAGNOSIS — J90 Pleural effusion, not elsewhere classified: Secondary | ICD-10-CM

## 2019-05-23 ENCOUNTER — Ambulatory Visit (INDEPENDENT_AMBULATORY_CARE_PROVIDER_SITE_OTHER): Payer: Medicare Other | Admitting: Thoracic Surgery (Cardiothoracic Vascular Surgery)

## 2019-05-23 ENCOUNTER — Other Ambulatory Visit: Payer: Self-pay | Admitting: Family Medicine

## 2019-05-23 ENCOUNTER — Inpatient Hospital Stay: Payer: Medicare Other

## 2019-05-23 ENCOUNTER — Telehealth: Payer: Self-pay | Admitting: Internal Medicine

## 2019-05-23 ENCOUNTER — Encounter: Payer: Self-pay | Admitting: Thoracic Surgery (Cardiothoracic Vascular Surgery)

## 2019-05-23 ENCOUNTER — Ambulatory Visit
Admission: RE | Admit: 2019-05-23 | Discharge: 2019-05-23 | Disposition: A | Discharge status: Home / Self Care | Payer: Medicare Other | Source: Ambulatory Visit | Attending: Thoracic Surgery (Cardiothoracic Vascular Surgery) | Admitting: Thoracic Surgery (Cardiothoracic Vascular Surgery)

## 2019-05-23 ENCOUNTER — Other Ambulatory Visit: Payer: Self-pay

## 2019-05-23 VITALS — BP 160/88 | HR 89 | Temp 97.8°F | Resp 20 | Ht 71.0 in | Wt 197.0 lb

## 2019-05-23 DIAGNOSIS — J90 Pleural effusion, not elsewhere classified: Secondary | ICD-10-CM | POA: Diagnosis not present

## 2019-05-23 DIAGNOSIS — C3491 Malignant neoplasm of unspecified part of right bronchus or lung: Secondary | ICD-10-CM | POA: Diagnosis not present

## 2019-05-23 NOTE — Telephone Encounter (Signed)
Returned patient's phone call regarding rescheduling, missed 09/29 appointment, per patient's request appointment has moved to 10/01.

## 2019-05-23 NOTE — Progress Notes (Signed)
[facility]       [place] [address]             [phone]     HPI: Ethan Brown returns for follow-up of his right pleural effusion  Ethan Brown is a 68 year old gentleman who is a lifelong non-smoker who was diagnosed with stage IV adenocarcinoma of the lung with a malignant right pleural effusion.  I did right VATS for pleural biopsy and placement of a pleural catheter on 03/30/2019.  He started out draining around 450 to 600 mL of fluid with each drainage.  I saw him back in the office on 04/21/2019.  We continued with every other day drainage.  He is on chemotherapy with carboplatin, Alimta, and Keytruda.  Past Medical History:  Diagnosis Date  . Borderline hypertension   . Borderline systolic hypertension   . Detached vitreous humor, left   . Dyspnea   . ED (erectile dysfunction) of organic origin   . Hyperlipidemia   . Lens subluxation, left   . Lung cancer (Ethan Brown)   . Migraine   . Migraine headache    takes PRN Imitrex  . Pseudophakia   . TGA (transient global amnesia) 2017  . Transient global amnesia     Current Outpatient Medications  Medication Sig Dispense Refill  . benzonatate (TESSALON) 200 MG capsule Take 1 capsule (200 mg total) by mouth 3 (three) times daily as needed for cough. 30 capsule 0  . cyanocobalamin (,VITAMIN B-12,) 1000 MCG/ML injection Wife is a Marine scientist and administers injection. Next injection due 04/06/2019.    . folic acid (FOLVITE) 1 MG tablet Take 1 tablet (1 mg total) by mouth daily. 30 tablet 4  . lidocaine-prilocaine (EMLA) cream Apply to the Port-A-Cath site 30-60 minutes before treatment 30 g 0  . prochlorperazine (COMPAZINE) 10 MG tablet Take 1 tablet (10 mg total) by mouth every 6 (six) hours as needed for nausea or vomiting. 30 tablet 0  . SUMAtriptan (IMITREX) 50 MG tablet Take 50 mg by mouth every 2 (two) hours as needed for migraine.     . traMADol (ULTRAM) 50 MG tablet Take 1 tablet (50 mg total) by mouth  every 6 (six) hours as needed for severe pain. 20 tablet 0   No current facility-administered medications for this visit.     Physical Exam BP (!) 160/88   Pulse 89   Temp 97.8 F (36.6 C) (Skin)   Resp 20   Ht 5\' 11"  (1.803 m)   Wt 197 lb (89.4 kg)   SpO2 95% Comment: RA  BMI 27.57 kg/m  68 year old man in no acute distress Alert and oriented x3 with no focal deficits Lungs diminished at right base, otherwise clear Cardiac regular rate and rhythm No peripheral edema  Diagnostic Tests: CHEST - 2 VIEW  COMPARISON:  Chest x-ray 04/21/2019.  FINDINGS: Right-sided chest tube in position located laterally with tip in the upper right hemithorax, similar to the prior study. No appreciable pneumothorax. Moderate right pleural effusion appears unchanged. Opacities in the right mid to lower lung, favored to reflect areas of atelectasis and/or consolidation. Left lung is clear. No left pleural effusion. No evidence of pulmonary edema. Heart size is normal. Upper mediastinal contours are within normal limits. Right subclavian single-lumen porta cath with tip terminating in the mid superior vena cava.  IMPRESSION: 1. Support apparatus, as above. 2. Moderate right pleural effusion with areas of atelectasis and/or consolidation throughout the right mid to lower  lung, similar to the prior examination.   Electronically Signed   By: Ethan Brown M.D.   On: 05/23/2019 09:50 I personally reviewed the chest x-ray images and concur with the findings noted above  Impression: Ethan Brown is a 68 year old non-smoker who has a malignant right pleural effusion secondary to stage IV adenocarcinoma of the lung.  I did a pleural biopsy and placement of a pleural catheter on 03/30/2019.  He has been draining his catheter every other day.  Initially he was draining between 450 and 600 mL with each drainage.  Over the past 2 weeks that drainage has tapered off and the maximum he has had  is about 250 and he has had less than 200 mL on a couple of occasions.  His chest x-ray shows stable small residual right pleural effusion.  At this point I think he can go to draining the catheter every third day.  If he notes 500 mL of drainage she will go back to every other day.  If he has 150 or less 3 times in a row he will go to once weekly.  Plan: Drain catheter every third day.  Algorithm for increasing or decreasing drainage discussed with patient.  Return in 3 weeks with PA and lateral chest x-ray.  Ethan Nakayama, MD [facility] [phone]

## 2019-05-24 DIAGNOSIS — C3411 Malignant neoplasm of upper lobe, right bronchus or lung: Secondary | ICD-10-CM | POA: Diagnosis not present

## 2019-05-24 DIAGNOSIS — C7931 Secondary malignant neoplasm of brain: Secondary | ICD-10-CM | POA: Diagnosis not present

## 2019-05-24 DIAGNOSIS — Z48813 Encounter for surgical aftercare following surgery on the respiratory system: Secondary | ICD-10-CM | POA: Diagnosis not present

## 2019-05-24 DIAGNOSIS — N400 Enlarged prostate without lower urinary tract symptoms: Secondary | ICD-10-CM | POA: Diagnosis not present

## 2019-05-24 DIAGNOSIS — G43909 Migraine, unspecified, not intractable, without status migrainosus: Secondary | ICD-10-CM | POA: Diagnosis not present

## 2019-05-24 DIAGNOSIS — J910 Malignant pleural effusion: Secondary | ICD-10-CM | POA: Diagnosis not present

## 2019-05-25 ENCOUNTER — Inpatient Hospital Stay: Payer: Medicare Other | Attending: Internal Medicine

## 2019-05-25 ENCOUNTER — Other Ambulatory Visit: Payer: Self-pay

## 2019-05-25 DIAGNOSIS — C7931 Secondary malignant neoplasm of brain: Secondary | ICD-10-CM | POA: Diagnosis not present

## 2019-05-25 DIAGNOSIS — Z79899 Other long term (current) drug therapy: Secondary | ICD-10-CM | POA: Insufficient documentation

## 2019-05-25 DIAGNOSIS — J910 Malignant pleural effusion: Secondary | ICD-10-CM | POA: Insufficient documentation

## 2019-05-25 DIAGNOSIS — Z5111 Encounter for antineoplastic chemotherapy: Secondary | ICD-10-CM | POA: Insufficient documentation

## 2019-05-25 DIAGNOSIS — C3411 Malignant neoplasm of upper lobe, right bronchus or lung: Secondary | ICD-10-CM | POA: Insufficient documentation

## 2019-05-25 DIAGNOSIS — R05 Cough: Secondary | ICD-10-CM | POA: Insufficient documentation

## 2019-05-25 DIAGNOSIS — C787 Secondary malignant neoplasm of liver and intrahepatic bile duct: Secondary | ICD-10-CM | POA: Insufficient documentation

## 2019-05-25 DIAGNOSIS — C3491 Malignant neoplasm of unspecified part of right bronchus or lung: Secondary | ICD-10-CM

## 2019-05-25 LAB — CMP (CANCER CENTER ONLY)
ALT: 36 U/L (ref 0–44)
AST: 32 U/L (ref 15–41)
Albumin: 3.2 g/dL — ABNORMAL LOW (ref 3.5–5.0)
Alkaline Phosphatase: 82 U/L (ref 38–126)
Anion gap: 8 (ref 5–15)
BUN: 26 mg/dL — ABNORMAL HIGH (ref 8–23)
CO2: 26 mmol/L (ref 22–32)
Calcium: 8.9 mg/dL (ref 8.9–10.3)
Chloride: 104 mmol/L (ref 98–111)
Creatinine: 0.93 mg/dL (ref 0.61–1.24)
GFR, Est AFR Am: >60 mL/min (ref >60)
GFR, Estimated: >60 mL/min (ref >60)
Glucose, Bld: 100 mg/dL — ABNORMAL HIGH (ref 70–99)
Potassium: 4.2 mmol/L (ref 3.5–5.1)
Sodium: 138 mmol/L (ref 135–145)
Total Bilirubin: 0.3 mg/dL (ref 0.3–1.2)
Total Protein: 6.7 g/dL (ref 6.5–8.1)

## 2019-05-25 LAB — CBC WITH DIFFERENTIAL (CANCER CENTER ONLY)
Abs Immature Granulocytes: 0 10*3/uL (ref 0.00–0.07)
Basophils Absolute: 0 10*3/uL (ref 0.0–0.1)
Basophils Relative: 1 %
Eosinophils Absolute: 0 10*3/uL (ref 0.0–0.5)
Eosinophils Relative: 1 %
HCT: 34.5 % — ABNORMAL LOW (ref 39.0–52.0)
Hemoglobin: 11.8 g/dL — ABNORMAL LOW (ref 13.0–17.0)
Immature Granulocytes: 0 %
Lymphocytes Relative: 28 %
Lymphs Abs: 0.6 10*3/uL — ABNORMAL LOW (ref 0.7–4.0)
MCH: 32.3 pg (ref 26.0–34.0)
MCHC: 34.2 g/dL (ref 30.0–36.0)
MCV: 94.5 fL (ref 80.0–100.0)
Monocytes Absolute: 0.4 10*3/uL (ref 0.1–1.0)
Monocytes Relative: 18 %
Neutro Abs: 1.2 10*3/uL — ABNORMAL LOW (ref 1.7–7.7)
Neutrophils Relative %: 52 %
Platelet Count: 156 10*3/uL (ref 150–400)
RBC: 3.65 MIL/uL — ABNORMAL LOW (ref 4.22–5.81)
RDW: 13.3 % (ref 11.5–15.5)
WBC Count: 2.2 10*3/uL — ABNORMAL LOW (ref 4.0–10.5)
nRBC: 0 % (ref 0.0–0.2)

## 2019-05-30 ENCOUNTER — Other Ambulatory Visit: Payer: Self-pay

## 2019-05-30 ENCOUNTER — Inpatient Hospital Stay: Payer: Medicare Other

## 2019-05-30 DIAGNOSIS — R05 Cough: Secondary | ICD-10-CM | POA: Diagnosis not present

## 2019-05-30 DIAGNOSIS — Z5111 Encounter for antineoplastic chemotherapy: Secondary | ICD-10-CM | POA: Diagnosis not present

## 2019-05-30 DIAGNOSIS — C3411 Malignant neoplasm of upper lobe, right bronchus or lung: Secondary | ICD-10-CM | POA: Diagnosis not present

## 2019-05-30 DIAGNOSIS — C787 Secondary malignant neoplasm of liver and intrahepatic bile duct: Secondary | ICD-10-CM | POA: Diagnosis not present

## 2019-05-30 DIAGNOSIS — C7931 Secondary malignant neoplasm of brain: Secondary | ICD-10-CM | POA: Diagnosis not present

## 2019-05-30 DIAGNOSIS — J910 Malignant pleural effusion: Secondary | ICD-10-CM | POA: Diagnosis not present

## 2019-05-30 DIAGNOSIS — C3491 Malignant neoplasm of unspecified part of right bronchus or lung: Secondary | ICD-10-CM

## 2019-05-30 LAB — CBC WITH DIFFERENTIAL (CANCER CENTER ONLY)
Abs Immature Granulocytes: 0 10*3/uL (ref 0.00–0.07)
Basophils Absolute: 0 10*3/uL (ref 0.0–0.1)
Basophils Relative: 0 %
Eosinophils Absolute: 0 10*3/uL (ref 0.0–0.5)
Eosinophils Relative: 1 %
HCT: 32.4 % — ABNORMAL LOW (ref 39.0–52.0)
Hemoglobin: 10.9 g/dL — ABNORMAL LOW (ref 13.0–17.0)
Immature Granulocytes: 0 %
Lymphocytes Relative: 24 %
Lymphs Abs: 0.7 10*3/uL (ref 0.7–4.0)
MCH: 32 pg (ref 26.0–34.0)
MCHC: 33.6 g/dL (ref 30.0–36.0)
MCV: 95 fL (ref 80.0–100.0)
Monocytes Absolute: 0.4 10*3/uL (ref 0.1–1.0)
Monocytes Relative: 13 %
Neutro Abs: 1.8 10*3/uL (ref 1.7–7.7)
Neutrophils Relative %: 62 %
Platelet Count: 139 10*3/uL — ABNORMAL LOW (ref 150–400)
RBC: 3.41 MIL/uL — ABNORMAL LOW (ref 4.22–5.81)
RDW: 14.6 % (ref 11.5–15.5)
WBC Count: 2.9 10*3/uL — ABNORMAL LOW (ref 4.0–10.5)
nRBC: 0 % (ref 0.0–0.2)

## 2019-05-30 LAB — CMP (CANCER CENTER ONLY)
ALT: 32 U/L (ref 0–44)
AST: 26 U/L (ref 15–41)
Albumin: 3.1 g/dL — ABNORMAL LOW (ref 3.5–5.0)
Alkaline Phosphatase: 90 U/L (ref 38–126)
Anion gap: 8 (ref 5–15)
BUN: 20 mg/dL (ref 8–23)
CO2: 26 mmol/L (ref 22–32)
Calcium: 8.7 mg/dL — ABNORMAL LOW (ref 8.9–10.3)
Chloride: 106 mmol/L (ref 98–111)
Creatinine: 1.02 mg/dL (ref 0.61–1.24)
GFR, Est AFR Am: >60 mL/min (ref >60)
GFR, Estimated: >60 mL/min (ref >60)
Glucose, Bld: 100 mg/dL — ABNORMAL HIGH (ref 70–99)
Potassium: 4.3 mmol/L (ref 3.5–5.1)
Sodium: 140 mmol/L (ref 135–145)
Total Bilirubin: <0.2 mg/dL — ABNORMAL LOW (ref 0.3–1.2)
Total Protein: 6.4 g/dL — ABNORMAL LOW (ref 6.5–8.1)

## 2019-06-02 LAB — GUARDANT 360

## 2019-06-04 ENCOUNTER — Other Ambulatory Visit: Payer: Self-pay | Admitting: Internal Medicine

## 2019-06-05 ENCOUNTER — Ambulatory Visit (HOSPITAL_COMMUNITY)
Admission: RE | Admit: 2019-06-05 | Discharge: 2019-06-05 | Disposition: A | Discharge status: Home / Self Care | Payer: Medicare Other | Source: Ambulatory Visit | Attending: Internal Medicine | Admitting: Internal Medicine

## 2019-06-05 ENCOUNTER — Other Ambulatory Visit: Payer: Self-pay

## 2019-06-05 ENCOUNTER — Encounter (HOSPITAL_COMMUNITY): Payer: Self-pay

## 2019-06-05 DIAGNOSIS — C3490 Malignant neoplasm of unspecified part of unspecified bronchus or lung: Secondary | ICD-10-CM | POA: Diagnosis not present

## 2019-06-05 HISTORY — DX: Essential (primary) hypertension: I10

## 2019-06-05 HISTORY — DX: Secondary malignant neoplasm of brain: C79.31

## 2019-06-05 MED ORDER — IOHEXOL 300 MG/ML  SOLN
100.0000 mL | Freq: Once | INTRAMUSCULAR | Status: AC | PRN
  Administered 2019-06-05: 100 mL via INTRAVENOUS

## 2019-06-05 MED ORDER — SODIUM CHLORIDE (PF) 0.9 % IJ SOLN
INTRAMUSCULAR | Status: AC
  Filled 2019-06-05: qty 50

## 2019-06-05 MED ORDER — HEPARIN SOD (PORK) LOCK FLUSH 100 UNIT/ML IV SOLN
INTRAVENOUS | Status: AC
  Filled 2019-06-05: qty 5

## 2019-06-06 ENCOUNTER — Other Ambulatory Visit: Payer: Self-pay | Admitting: Internal Medicine

## 2019-06-06 ENCOUNTER — Encounter: Payer: Self-pay | Admitting: Internal Medicine

## 2019-06-06 ENCOUNTER — Inpatient Hospital Stay: Payer: Medicare Other

## 2019-06-06 ENCOUNTER — Other Ambulatory Visit: Payer: Self-pay

## 2019-06-06 ENCOUNTER — Inpatient Hospital Stay (HOSPITAL_BASED_OUTPATIENT_CLINIC_OR_DEPARTMENT_OTHER): Payer: Medicare Other | Admitting: Internal Medicine

## 2019-06-06 VITALS — BP 139/87 | HR 78 | Temp 98.3°F | Resp 18 | Ht 71.0 in | Wt 201.6 lb

## 2019-06-06 DIAGNOSIS — R5382 Chronic fatigue, unspecified: Secondary | ICD-10-CM

## 2019-06-06 DIAGNOSIS — J90 Pleural effusion, not elsewhere classified: Secondary | ICD-10-CM

## 2019-06-06 DIAGNOSIS — Z5111 Encounter for antineoplastic chemotherapy: Secondary | ICD-10-CM | POA: Diagnosis not present

## 2019-06-06 DIAGNOSIS — J910 Malignant pleural effusion: Secondary | ICD-10-CM | POA: Diagnosis not present

## 2019-06-06 DIAGNOSIS — R05 Cough: Secondary | ICD-10-CM | POA: Diagnosis not present

## 2019-06-06 DIAGNOSIS — Z5112 Encounter for antineoplastic immunotherapy: Secondary | ICD-10-CM | POA: Diagnosis not present

## 2019-06-06 DIAGNOSIS — C7931 Secondary malignant neoplasm of brain: Secondary | ICD-10-CM | POA: Diagnosis not present

## 2019-06-06 DIAGNOSIS — C3491 Malignant neoplasm of unspecified part of right bronchus or lung: Secondary | ICD-10-CM | POA: Diagnosis not present

## 2019-06-06 DIAGNOSIS — C3411 Malignant neoplasm of upper lobe, right bronchus or lung: Secondary | ICD-10-CM | POA: Diagnosis not present

## 2019-06-06 DIAGNOSIS — C787 Secondary malignant neoplasm of liver and intrahepatic bile duct: Secondary | ICD-10-CM | POA: Diagnosis not present

## 2019-06-06 DIAGNOSIS — C3490 Malignant neoplasm of unspecified part of unspecified bronchus or lung: Secondary | ICD-10-CM | POA: Diagnosis not present

## 2019-06-06 DIAGNOSIS — Z95828 Presence of other vascular implants and grafts: Secondary | ICD-10-CM

## 2019-06-06 LAB — CMP (CANCER CENTER ONLY)
ALT: 27 U/L (ref 0–44)
AST: 23 U/L (ref 15–41)
Albumin: 3.1 g/dL — ABNORMAL LOW (ref 3.5–5.0)
Alkaline Phosphatase: 91 U/L (ref 38–126)
Anion gap: 7 (ref 5–15)
BUN: 14 mg/dL (ref 8–23)
CO2: 26 mmol/L (ref 22–32)
Calcium: 8.6 mg/dL — ABNORMAL LOW (ref 8.9–10.3)
Chloride: 108 mmol/L (ref 98–111)
Creatinine: 1.01 mg/dL (ref 0.61–1.24)
GFR, Est AFR Am: >60 mL/min (ref >60)
GFR, Estimated: >60 mL/min (ref >60)
Glucose, Bld: 103 mg/dL — ABNORMAL HIGH (ref 70–99)
Potassium: 4.3 mmol/L (ref 3.5–5.1)
Sodium: 141 mmol/L (ref 135–145)
Total Bilirubin: <0.2 mg/dL — ABNORMAL LOW (ref 0.3–1.2)
Total Protein: 6.5 g/dL (ref 6.5–8.1)

## 2019-06-06 LAB — CBC WITH DIFFERENTIAL (CANCER CENTER ONLY)
Abs Immature Granulocytes: 0.02 10*3/uL (ref 0.00–0.07)
Basophils Absolute: 0 10*3/uL (ref 0.0–0.1)
Basophils Relative: 1 %
Eosinophils Absolute: 0 10*3/uL (ref 0.0–0.5)
Eosinophils Relative: 1 %
HCT: 33.7 % — ABNORMAL LOW (ref 39.0–52.0)
Hemoglobin: 11.3 g/dL — ABNORMAL LOW (ref 13.0–17.0)
Immature Granulocytes: 1 %
Lymphocytes Relative: 22 %
Lymphs Abs: 0.8 10*3/uL (ref 0.7–4.0)
MCH: 32.2 pg (ref 26.0–34.0)
MCHC: 33.5 g/dL (ref 30.0–36.0)
MCV: 96 fL (ref 80.0–100.0)
Monocytes Absolute: 0.4 10*3/uL (ref 0.1–1.0)
Monocytes Relative: 12 %
Neutro Abs: 2.3 10*3/uL (ref 1.7–7.7)
Neutrophils Relative %: 63 %
Platelet Count: 267 10*3/uL (ref 150–400)
RBC: 3.51 MIL/uL — ABNORMAL LOW (ref 4.22–5.81)
RDW: 15.9 % — ABNORMAL HIGH (ref 11.5–15.5)
WBC Count: 3.5 10*3/uL — ABNORMAL LOW (ref 4.0–10.5)
nRBC: 0 % (ref 0.0–0.2)

## 2019-06-06 LAB — TSH: TSH: 1.517 u[IU]/mL (ref 0.320–4.118)

## 2019-06-06 MED ORDER — SODIUM CHLORIDE 0.9 % IV SOLN
563.0000 mg | Freq: Once | INTRAVENOUS | Status: AC
  Administered 2019-06-06: 560 mg via INTRAVENOUS
  Filled 2019-06-06: qty 56

## 2019-06-06 MED ORDER — SODIUM CHLORIDE 0.9 % IV SOLN
Freq: Once | INTRAVENOUS | Status: AC
  Administered 2019-06-06: 11:00:00 via INTRAVENOUS
  Filled 2019-06-06: qty 250

## 2019-06-06 MED ORDER — SODIUM CHLORIDE 0.9 % IV SOLN
200.0000 mg | Freq: Once | INTRAVENOUS | Status: AC
  Administered 2019-06-06: 200 mg via INTRAVENOUS
  Filled 2019-06-06: qty 8

## 2019-06-06 MED ORDER — PALONOSETRON HCL INJECTION 0.25 MG/5ML
0.2500 mg | Freq: Once | INTRAVENOUS | Status: AC
  Administered 2019-06-06: 0.25 mg via INTRAVENOUS

## 2019-06-06 MED ORDER — HEPARIN SOD (PORK) LOCK FLUSH 100 UNIT/ML IV SOLN
500.0000 [IU] | Freq: Once | INTRAVENOUS | Status: DC | PRN
  Filled 2019-06-06: qty 5

## 2019-06-06 MED ORDER — PALONOSETRON HCL INJECTION 0.25 MG/5ML
INTRAVENOUS | Status: AC
  Filled 2019-06-06: qty 5

## 2019-06-06 MED ORDER — BENZONATATE 200 MG PO CAPS: 200.0000 mg | ORAL_CAPSULE | Freq: Three times a day (TID) | ORAL | 0 refills | Status: DC | PRN

## 2019-06-06 MED ORDER — SODIUM CHLORIDE 0.9 % IV SOLN
475.0000 mg/m2 | Freq: Once | INTRAVENOUS | Status: AC
  Administered 2019-06-06: 1000 mg via INTRAVENOUS
  Filled 2019-06-06: qty 40

## 2019-06-06 MED ORDER — SODIUM CHLORIDE 0.9 % IV SOLN
Freq: Once | INTRAVENOUS | Status: AC
  Administered 2019-06-06: 12:00:00 via INTRAVENOUS
  Filled 2019-06-06: qty 5

## 2019-06-06 MED ORDER — SODIUM CHLORIDE 0.9% FLUSH
10.0000 mL | INTRAVENOUS | Status: DC | PRN
  Administered 2019-06-06: 10:00:00 10 mL
  Filled 2019-06-06: qty 10

## 2019-06-06 MED ORDER — SODIUM CHLORIDE 0.9% FLUSH
10.0000 mL | INTRAVENOUS | Status: DC | PRN
  Filled 2019-06-06: qty 10

## 2019-06-06 NOTE — Telephone Encounter (Signed)
Refill request

## 2019-06-06 NOTE — Progress Notes (Signed)
[facility] Telephone:([phone]) [phone]   [phone]  OFFICE PROGRESS NOTE  Laurey Morale, MD [place] [address]  DIAGNOSIS: Stage IV (T2b, N2, M1c) presented with right middle lobe lung mass in addition to mediastinal lymphadenopathy as well as malignant right pleural effusion with pleural-based nodules and suspicious right hepatic lesion and the brain metastasis diagnosed in July 2020.  Molecular Biomarkers: [id number] (Exon 20 insertion) 0.2% Dacomitinib,Neratinib,Osimertinib  [id number] 0.1% None    PRIOR THERAPY: None  CURRENT THERAPY: Systemic chemotherapy with carboplatin for AUC of 5, Alimta 500 mg/M2 and Keytruda 200 mg IV every 3 weeks.  First dose April 04, 2019.  Status post 3 cycles .  INTERVAL HISTORY: Ethan Brown 68 y.o. male returns to the clinic today for follow-up visit.  The patient is feeling fine today with no concerning complaints except for mild cough.  He is requesting refill of Tessalon Perles.  He denied having any current chest pain, shortness of breath or hemoptysis.  He denied having any recent weight loss or night sweats.  He has no nausea, vomiting, diarrhea or constipation.  He has no significant weight loss or night sweats.  He has no headache or visual changes.  He continues to tolerate his treatment with carboplatin, Alimta and Keytruda fairly well.  He has repeat CT scan of the chest, abdomen pelvis performed recently and is here for evaluation and discussion of his discuss results.  MEDICAL HISTORY: Past Medical History:  Diagnosis Date   Borderline hypertension    Borderline systolic hypertension    Brain metastasis ([place]) dx'd 01/2019   Detached vitreous humor, left    Dyspnea    ED (erectile dysfunction) of organic origin    Hyperlipidemia    Hypertension    Lens subluxation, left    Lung cancer ([place]) dx'd 01/2019   Migraine    Migraine headache    takes PRN  Imitrex   Pseudophakia    TGA (transient global amnesia) 2017   Transient global amnesia     ALLERGIES:  has No Known Allergies.  MEDICATIONS:  Current Outpatient Medications  Medication Sig Dispense Refill   benzonatate (TESSALON) 200 MG capsule Take 1 capsule (200 mg total) by mouth 3 (three) times daily as needed for cough. 30 capsule 0   cyanocobalamin (,VITAMIN B-12,) 1000 MCG/ML injection INJECT 1 ML (1,000 MCG TOTAL) INTO THE MUSCLE ONCE FOR 1 DOSE. 5 mL 1   folic acid (FOLVITE) 1 MG tablet Take 1 tablet (1 mg total) by mouth daily. 30 tablet 4   lidocaine-prilocaine (EMLA) cream Apply to the Port-A-Cath site 30-60 minutes before treatment 30 g 0   prochlorperazine (COMPAZINE) 10 MG tablet Take 1 tablet (10 mg total) by mouth every 6 (six) hours as needed for nausea or vomiting. 30 tablet 0   SUMAtriptan (IMITREX) 50 MG tablet Take 50 mg by mouth every 2 (two) hours as needed for migraine.      traMADol (ULTRAM) 50 MG tablet Take 1 tablet (50 mg total) by mouth every 6 (six) hours as needed for severe pain. 20 tablet 0   No current facility-administered medications for this visit.     SURGICAL HISTORY:  Past Surgical History:  Procedure Laterality Date   CAROTID DOPPLERS  09/2017   Mild non-obstructive disease   CATARACT EXTRACTION, BILATERAL     cataract, left  2018   CHEST TUBE INSERTION Right 03/29/2019   Procedure: INSERTION PLEURAL DRAINAGE  CATHETER;  Surgeon: [name], MD;  Location: [facility] OR;  Service: Thoracic;  Laterality: Right;   COLONOSCOPY  2016   clear, repeat in 10 yrs    Eye surgeries     , Lens attachment.  Vitrectomy   FEMORAL HERNIA REPAIR     HERNIA REPAIR     IR THORACENTESIS ASP PLEURAL SPACE W/IMG GUIDE  02/22/2019   IR THORACENTESIS ASP PLEURAL SPACE W/IMG GUIDE  03/13/2019   LUMBAR LAMINECTOMY     PLEURAL BIOPSY Right 03/29/2019   Procedure: PLEURAL BIOPSY;  Surgeon: [name], MD;  Location: [facility] OR;   Service: Thoracic;  Laterality: Right;   PORTACATH PLACEMENT N/A 03/29/2019   Procedure: INSERTION PORT-A-CATH;  Surgeon: [name], MD;  Location: [facility];  Service: Thoracic;  Laterality: N/A;   retinal attachment, right     sclearl buckle, right     victrectomy,right     VIDEO ASSISTED THORACOSCOPY Right 03/29/2019   Procedure: VIDEO ASSISTED THORACOSCOPY;  Surgeon: [name], MD;  Location: [facility];  Service: Thoracic;  Laterality: Right;    REVIEW OF SYSTEMS:  Constitutional: negative Eyes: negative Ears, nose, mouth, throat, and face: negative Respiratory: positive for cough Cardiovascular: negative Gastrointestinal: negative Genitourinary:negative Integument/breast: negative Hematologic/lymphatic: negative Musculoskeletal:negative Neurological: negative Behavioral/Psych: negative Endocrine: negative Allergic/Immunologic: negative   PHYSICAL EXAMINATION: General appearance: alert, cooperative and no distress Head: Normocephalic, without obvious abnormality, atraumatic Neck: no adenopathy, no JVD, supple, symmetrical, trachea midline and thyroid not enlarged, symmetric, no tenderness/mass/nodules Lymph nodes: Cervical, supraclavicular, and axillary nodes normal. Resp: clear to auscultation bilaterally Back: symmetric, no curvature. ROM normal. No CVA tenderness. Cardio: regular rate and rhythm, S1, S2 normal, no murmur, click, rub or gallop GI: soft, non-tender; bowel sounds normal; no masses,  no organomegaly Extremities: extremities normal, atraumatic, no cyanosis or edema Neurologic: Alert and oriented X 3, normal strength and tone. Normal symmetric reflexes. Normal coordination and gait  ECOG PERFORMANCE STATUS: 1 - Symptomatic but completely ambulatory  Blood pressure 139/87, pulse 78, temperature 98.3 F (36.8 C), temperature source Temporal, resp. rate 18, height 5' 11" (1.803 m), weight 201 lb 9.6 oz (91.4 kg), SpO2 98 %.  LABORATORY  DATA: Lab Results  Component Value Date   WBC 3.5 (L) 06/06/2019   HGB 11.3 (L) 06/06/2019   HCT 33.7 (L) 06/06/2019   MCV 96.0 06/06/2019   PLT 267 06/06/2019      Chemistry      Component Value Date/Time   NA 140 05/30/2019 1525   K 4.3 05/30/2019 1525   CL 106 05/30/2019 1525   CO2 26 05/30/2019 1525   BUN 20 05/30/2019 1525   CREATININE 1.02 05/30/2019 1525      Component Value Date/Time   CALCIUM 8.7 (L) 05/30/2019 1525   ALKPHOS 90 05/30/2019 1525   AST 26 05/30/2019 1525   ALT 32 05/30/2019 1525   BILITOT <0.2 (L) 05/30/2019 1525       RADIOGRAPHIC STUDIES: Dg Chest 2 View  Result Date: 05/23/2019 CLINICAL DATA:  68 year old male with history of lung cancer status post right VATS procedure 03/29/2019. EXAM: CHEST - 2 VIEW COMPARISON:  Chest x-ray 04/21/2019. FINDINGS: Right-sided chest tube in position located laterally with tip in the upper right hemithorax, similar to the prior study. No appreciable pneumothorax. Moderate right pleural effusion appears unchanged. Opacities in the right mid to lower lung, favored to reflect areas of atelectasis and/or consolidation. Left lung is clear. No left pleural effusion. No evidence of  pulmonary edema. Heart size is normal. Upper mediastinal contours are within normal limits. Right subclavian single-lumen porta cath with tip terminating in the mid superior vena cava. IMPRESSION: 1. Support apparatus, as above. 2. Moderate right pleural effusion with areas of atelectasis and/or consolidation throughout the right mid to lower lung, similar to the prior examination. Electronically Signed   By: Vinnie Langton M.D.   On: 05/23/2019 09:50   Ct Chest W Contrast  Result Date: 06/05/2019 CLINICAL DATA:  Non-small-cell lung cancer.  Restaging. EXAM: CT CHEST, ABDOMEN, AND PELVIS WITH CONTRAST TECHNIQUE: Multidetector CT imaging of the chest, abdomen and pelvis was performed following the standard protocol during bolus administration of  intravenous contrast. CONTRAST:  156m OMNIPAQUE IOHEXOL 300 MG/ML  SOLN COMPARISON:  PET-CT 03/27/2019 FINDINGS: CT CHEST FINDINGS Cardiovascular: The heart size is normal. No substantial pericardial effusion. No thoracic aortic aneurysm. Right Port-A-Cath tip is positioned in the mid SVC. Mediastinum/Nodes: 15 mm short axis precarinal node was 18 mm previously. 7 mm short axis para-aortic node (31/2) was 9 mm previously. Lymphoid tissue noted in both hilar regions without lymphadenopathy. There is no axillary lymphadenopathy. Lungs/Pleura: Large right pleural effusion seen previously has decreased substantially in the interval with some residual loculated fluid noted in the right major fissure. Nodularity is seen in the major and minor fissures of the right hemithorax. Right middle lobe mass measures 1.8 x 2.3 cm today decreased from 2.5 x 3.0 cm when I remeasure on the 02/09/2019 exam. This lesion was partially obscured on the PET-CT of 03/27/2019. There is some residual collapse/consolidative change in the right lower lobe. Tiny subpleural nodule left lower lobe (132/6) stable since 02/09/2019. Similar tiny left lower lobe subpleural nodule on 01/20 2/6 is also stable. Right pleural drain visualized in situ. Areas of pleural nodularity are seen in the right hemithorax. Musculoskeletal: No worrisome lytic or sclerotic osseous abnormality. CT ABDOMEN PELVIS FINDINGS Hepatobiliary: The segment VII lesion seen on PET-CT and measuring 2 cm is decreased in the interval measuring 9 mm today (image 59/2). The 5 mm retrocaval node seen on previous PET-CT is stable on 74/2 today. There is no evidence for gallstones, gallbladder wall thickening, or pericholecystic fluid. No intrahepatic or extrahepatic biliary dilation. Pancreas: No focal mass lesion. No dilatation of the main duct. No intraparenchymal cyst. No peripancreatic edema. Spleen: No splenomegaly. No focal mass lesion. Adrenals/Urinary Tract: No adrenal nodule or  mass. Kidneys unremarkable. No evidence for hydroureter. The urinary bladder appears normal for the degree of distention. Stomach/Bowel: Stomach is unremarkable. No gastric wall thickening. No evidence of outlet obstruction. Duodenum is normally positioned as is the ligament of Treitz. No small bowel wall thickening. No small bowel dilatation. The terminal ileum is normal. The appendix is normal. No gross colonic mass. No colonic wall thickening. Diverticular changes are noted in the left colon without evidence of diverticulitis. Vascular/Lymphatic: No abdominal aortic aneurysm. No abdominal aortic atherosclerotic calcification. There is no gastrohepatic or hepatoduodenal ligament lymphadenopathy. No intraperitoneal or retroperitoneal lymphadenopathy. No pelvic sidewall lymphadenopathy. Reproductive: Prostate gland is enlarged. Other: No intraperitoneal free fluid. Musculoskeletal: No worrisome lytic or sclerotic osseous abnormality. IMPRESSION: 1. Interval decrease in right pleural effusion with mild decrease in mediastinal and right hilar lymphadenopathy. 2. Right middle lobe pulmonary lesion has decreased in size since 02/09/2019 and was partially obscured on the intervening PET-CT. 3. Residual nodularity is seen in the major and minor fissures of the right lung with associated pleural nodularity, consistent with metastatic disease. 4. The hypermetabolic lesion  seen in segment VII of the liver on previous PET-CT has decreased substantially in size in the interval, now measuring 9 mm. The 5 mm hypermetabolic retrocaval lymph node seen on PET-CT is stable in size. No new or progressive findings in the abdomen/pelvis. Electronically Signed   By: Misty Stanley M.D.   On: 06/05/2019 11:53   Ct Abdomen Pelvis W Contrast  Result Date: 06/05/2019 CLINICAL DATA:  Non-small-cell lung cancer.  Restaging. EXAM: CT CHEST, ABDOMEN, AND PELVIS WITH CONTRAST TECHNIQUE: Multidetector CT imaging of the chest, abdomen and  pelvis was performed following the standard protocol during bolus administration of intravenous contrast. CONTRAST:  177m OMNIPAQUE IOHEXOL 300 MG/ML  SOLN COMPARISON:  PET-CT 03/27/2019 FINDINGS: CT CHEST FINDINGS Cardiovascular: The heart size is normal. No substantial pericardial effusion. No thoracic aortic aneurysm. Right Port-A-Cath tip is positioned in the mid SVC. Mediastinum/Nodes: 15 mm short axis precarinal node was 18 mm previously. 7 mm short axis para-aortic node (31/2) was 9 mm previously. Lymphoid tissue noted in both hilar regions without lymphadenopathy. There is no axillary lymphadenopathy. Lungs/Pleura: Large right pleural effusion seen previously has decreased substantially in the interval with some residual loculated fluid noted in the right major fissure. Nodularity is seen in the major and minor fissures of the right hemithorax. Right middle lobe mass measures 1.8 x 2.3 cm today decreased from 2.5 x 3.0 cm when I remeasure on the 02/09/2019 exam. This lesion was partially obscured on the PET-CT of 03/27/2019. There is some residual collapse/consolidative change in the right lower lobe. Tiny subpleural nodule left lower lobe (132/6) stable since 02/09/2019. Similar tiny left lower lobe subpleural nodule on 01/20 2/6 is also stable. Right pleural drain visualized in situ. Areas of pleural nodularity are seen in the right hemithorax. Musculoskeletal: No worrisome lytic or sclerotic osseous abnormality. CT ABDOMEN PELVIS FINDINGS Hepatobiliary: The segment VII lesion seen on PET-CT and measuring 2 cm is decreased in the interval measuring 9 mm today (image 59/2). The 5 mm retrocaval node seen on previous PET-CT is stable on 74/2 today. There is no evidence for gallstones, gallbladder wall thickening, or pericholecystic fluid. No intrahepatic or extrahepatic biliary dilation. Pancreas: No focal mass lesion. No dilatation of the main duct. No intraparenchymal cyst. No peripancreatic edema. Spleen:  No splenomegaly. No focal mass lesion. Adrenals/Urinary Tract: No adrenal nodule or mass. Kidneys unremarkable. No evidence for hydroureter. The urinary bladder appears normal for the degree of distention. Stomach/Bowel: Stomach is unremarkable. No gastric wall thickening. No evidence of outlet obstruction. Duodenum is normally positioned as is the ligament of Treitz. No small bowel wall thickening. No small bowel dilatation. The terminal ileum is normal. The appendix is normal. No gross colonic mass. No colonic wall thickening. Diverticular changes are noted in the left colon without evidence of diverticulitis. Vascular/Lymphatic: No abdominal aortic aneurysm. No abdominal aortic atherosclerotic calcification. There is no gastrohepatic or hepatoduodenal ligament lymphadenopathy. No intraperitoneal or retroperitoneal lymphadenopathy. No pelvic sidewall lymphadenopathy. Reproductive: Prostate gland is enlarged. Other: No intraperitoneal free fluid. Musculoskeletal: No worrisome lytic or sclerotic osseous abnormality. IMPRESSION: 1. Interval decrease in right pleural effusion with mild decrease in mediastinal and right hilar lymphadenopathy. 2. Right middle lobe pulmonary lesion has decreased in size since 02/09/2019 and was partially obscured on the intervening PET-CT. 3. Residual nodularity is seen in the major and minor fissures of the right lung with associated pleural nodularity, consistent with metastatic disease. 4. The hypermetabolic lesion seen in segment VII of the liver on previous PET-CT has  decreased substantially in size in the interval, now measuring 9 mm. The 5 mm hypermetabolic retrocaval lymph node seen on PET-CT is stable in size. No new or progressive findings in the abdomen/pelvis. Electronically Signed   By: Misty Stanley M.D.   On: 06/05/2019 11:53    ASSESSMENT AND PLAN: This is a very pleasant 68 years old white male recently diagnosed with a stage IV (T2b, N2, M1C) non-small cell lung  cancer, adenocarcinoma with positive EGFR mutation in exon 20 (resistant mutation) diagnosed in July 2020 and presented with extensive disease involving the right upper lobe as well as the right middle and lower lobe with extensive pleural based metastasis as well as mediastinal and hilar disease with malignant pleural effusion as well as liver and brain metastasis. Unfortunately the patient has no actionable mutations based on the molecular studies by [facility].   The patient is currently undergoing systemic chemotherapy with carboplatin for AUC of 5, Alimta 500 mg/M2 and Keytruda 200 mg IV every 3 weeks status post 3 cycles. The patient has been tolerating this treatment well with no concerning adverse effects. He had repeat CT scan of the chest, abdomen pelvis performed recently.  I personally and independently reviewed the scans and discussed the results with the patient today. His a scan showed improvement of his disease. I recommended for the patient to continue his current treatment with carboplatin, Alimta and Keytruda for cycle #4.  Starting from cycle #5 he will be treated with maintenance treatment with Alimta and Keytruda every 3 weeks. For the recurrent right pleural effusion, the patient will continue with the drainage of the pleural fluid via the Pleurx catheter. For the dry cough, he will continue on Tessalon capsules to be used 3 times a day as needed.  I will give him refill of his medication today. The patient will come back for follow-up visit in 3 weeks for evaluation before the next cycle of his treatment. He was advised to call immediately if he has any concerning symptoms in the interval. The patient voices understanding of current disease status and treatment options and is in agreement with the current care plan. All questions were answered. The patient knows to call the clinic with any problems, questions or concerns. We can certainly see the patient much sooner if  necessary.  Disclaimer: This note was dictated with voice recognition software. Similar sounding words can inadvertently be transcribed and may not be corrected upon review.

## 2019-06-06 NOTE — Progress Notes (Signed)
Pt. States he had a B12 shot 06/01/19.

## 2019-06-06 NOTE — Patient Instructions (Signed)
[facility] Discharge Instructions for Patients Receiving Chemotherapy  Today you received the following chemotherapy agents:  Alimta, Carboplatin and Immunotherapy: Pembrolizumab.  To help prevent nausea and vomiting after your treatment, we encourage you to take your nausea medication as directed by your MD.   If you develop nausea and vomiting that is not controlled by your nausea medication, call the clinic.   BELOW ARE SYMPTOMS THAT SHOULD BE REPORTED IMMEDIATELY:  *FEVER GREATER THAN 100.5 F  *CHILLS WITH OR WITHOUT FEVER  NAUSEA AND VOMITING THAT IS NOT CONTROLLED WITH YOUR NAUSEA MEDICATION  *UNUSUAL SHORTNESS OF BREATH  *UNUSUAL BRUISING OR BLEEDING  TENDERNESS IN MOUTH AND THROAT WITH OR WITHOUT PRESENCE OF ULCERS  *URINARY PROBLEMS  *BOWEL PROBLEMS  UNUSUAL RASH Items with * indicate a potential emergency and should be followed up as soon as possible.  Feel free to call the clinic should you have any questions or concerns. The clinic phone number is [phone].  Please show the [facility] at check-in to the Emergency Department and triage nurse. Coronavirus (COVID-19) Are you at risk?  Are you at risk for the Coronavirus (COVID-19)?  To be considered HIGH RISK for Coronavirus (COVID-19), you have to meet the following criteria:  . Traveled to [place], [place], [place], [place] or [place]; or in the [place] to [place], [place], [place], or [place]; and have fever, cough, and shortness of breath within the last 2 weeks of travel OR . Been in close contact with a person diagnosed with COVID-19 within the last 2 weeks and have fever, cough, and shortness of breath . IF YOU DO NOT MEET THESE CRITERIA, YOU ARE CONSIDERED LOW RISK FOR COVID-19.  What to do if you are HIGH RISK for COVID-19?  Marland Kitchen If you are having a medical emergency, call 911. . Seek medical care right away. Before you go to a doctor's office, urgent care or  emergency department, call ahead and tell them about your recent travel, contact with someone diagnosed with COVID-19, and your symptoms. You should receive instructions from your physician's office regarding next steps of care.  . When you arrive at healthcare provider, tell the healthcare staff immediately you have returned from visiting [place], [place], [place], [place] or [place]; or traveled in the [place] to [place], [place], [place], or [place]; in the last two weeks or you have been in close contact with a person diagnosed with COVID-19 in the last 2 weeks.   . Tell the health care staff about your symptoms: fever, cough and shortness of breath. . After you have been seen by a medical provider, you will be either: o Tested for (COVID-19) and discharged home on quarantine except to seek medical care if symptoms worsen, and asked to  - Stay home and avoid contact with others until you get your results (4-5 days)  - Avoid travel on public transportation if possible (such as bus, train, or airplane) or o Sent to the Emergency Department by EMS for evaluation, COVID-19 testing, and possible admission depending on your condition and test results.  What to do if you are LOW RISK for COVID-19?  Reduce your risk of any infection by using the same precautions used for avoiding the common cold or flu:  Marland Kitchen Wash your hands often with soap and warm water for at least 20 seconds.  If soap and water are not readily available, use an alcohol-based hand sanitizer with at least 60%  alcohol.  . If coughing or sneezing, cover your mouth and nose by coughing or sneezing into the elbow areas of your shirt or coat, into a tissue or into your sleeve (not your hands). . Avoid shaking hands with others and consider head nods or verbal greetings only. . Avoid touching your eyes, nose, or mouth with unwashed hands.  . Avoid close contact with people who are sick. . Avoid places or events with large numbers  of people in one location, like concerts or sporting events. . Carefully consider travel plans you have or are making. . If you are planning any travel outside or inside the [place], visit the CDC's Travelers' Health webpage for the latest health notices. . If you have some symptoms but not all symptoms, continue to monitor at home and seek medical attention if your symptoms worsen. . If you are having a medical emergency, call 911.   [facility] / e-Visit: [email or web address]         [facility]: [facility]: [phone]                   [facility]: [phone]

## 2019-06-07 ENCOUNTER — Telehealth: Payer: Self-pay | Admitting: Internal Medicine

## 2019-06-07 NOTE — Telephone Encounter (Signed)
Scheduled appt per 10/13 los - pt to get an updated schedule next visit.

## 2019-06-12 ENCOUNTER — Other Ambulatory Visit: Payer: Self-pay | Admitting: Internal Medicine

## 2019-06-13 ENCOUNTER — Inpatient Hospital Stay: Payer: Medicare Other

## 2019-06-13 ENCOUNTER — Ambulatory Visit: Payer: Medicare Other | Admitting: Thoracic Surgery (Cardiothoracic Vascular Surgery)

## 2019-06-13 ENCOUNTER — Other Ambulatory Visit: Payer: Self-pay

## 2019-06-13 DIAGNOSIS — R05 Cough: Secondary | ICD-10-CM | POA: Diagnosis not present

## 2019-06-13 DIAGNOSIS — C3491 Malignant neoplasm of unspecified part of right bronchus or lung: Secondary | ICD-10-CM

## 2019-06-13 DIAGNOSIS — J910 Malignant pleural effusion: Secondary | ICD-10-CM | POA: Diagnosis not present

## 2019-06-13 DIAGNOSIS — C3411 Malignant neoplasm of upper lobe, right bronchus or lung: Secondary | ICD-10-CM | POA: Diagnosis not present

## 2019-06-13 DIAGNOSIS — C787 Secondary malignant neoplasm of liver and intrahepatic bile duct: Secondary | ICD-10-CM | POA: Diagnosis not present

## 2019-06-13 DIAGNOSIS — C7931 Secondary malignant neoplasm of brain: Secondary | ICD-10-CM | POA: Diagnosis not present

## 2019-06-13 DIAGNOSIS — Z5111 Encounter for antineoplastic chemotherapy: Secondary | ICD-10-CM | POA: Diagnosis not present

## 2019-06-13 LAB — CBC WITH DIFFERENTIAL (CANCER CENTER ONLY)
Abs Immature Granulocytes: 0.01 10*3/uL (ref 0.00–0.07)
Basophils Absolute: 0 10*3/uL (ref 0.0–0.1)
Basophils Relative: 1 %
Eosinophils Absolute: 0 10*3/uL (ref 0.0–0.5)
Eosinophils Relative: 1 %
HCT: 31.7 % — ABNORMAL LOW (ref 39.0–52.0)
Hemoglobin: 10.7 g/dL — ABNORMAL LOW (ref 13.0–17.0)
Immature Granulocytes: 1 %
Lymphocytes Relative: 36 %
Lymphs Abs: 0.6 10*3/uL — ABNORMAL LOW (ref 0.7–4.0)
MCH: 31.8 pg (ref 26.0–34.0)
MCHC: 33.8 g/dL (ref 30.0–36.0)
MCV: 94.1 fL (ref 80.0–100.0)
Monocytes Absolute: 0.4 10*3/uL (ref 0.1–1.0)
Monocytes Relative: 24 %
Neutro Abs: 0.6 10*3/uL — ABNORMAL LOW (ref 1.7–7.7)
Neutrophils Relative %: 37 %
Platelet Count: 210 10*3/uL (ref 150–400)
RBC: 3.37 MIL/uL — ABNORMAL LOW (ref 4.22–5.81)
RDW: 15 % (ref 11.5–15.5)
WBC Count: 1.7 10*3/uL — ABNORMAL LOW (ref 4.0–10.5)
nRBC: 0 % (ref 0.0–0.2)

## 2019-06-13 LAB — CMP (CANCER CENTER ONLY)
ALT: 31 U/L (ref 0–44)
AST: 27 U/L (ref 15–41)
Albumin: 3.3 g/dL — ABNORMAL LOW (ref 3.5–5.0)
Alkaline Phosphatase: 94 U/L (ref 38–126)
Anion gap: 11 (ref 5–15)
BUN: 17 mg/dL (ref 8–23)
CO2: 24 mmol/L (ref 22–32)
Calcium: 8.8 mg/dL — ABNORMAL LOW (ref 8.9–10.3)
Chloride: 105 mmol/L (ref 98–111)
Creatinine: 0.83 mg/dL (ref 0.61–1.24)
GFR, Est AFR Am: >60 mL/min (ref >60)
GFR, Estimated: >60 mL/min (ref >60)
Glucose, Bld: 85 mg/dL (ref 70–99)
Potassium: 4.2 mmol/L (ref 3.5–5.1)
Sodium: 140 mmol/L (ref 135–145)
Total Bilirubin: 0.3 mg/dL (ref 0.3–1.2)
Total Protein: 6.5 g/dL (ref 6.5–8.1)

## 2019-06-15 ENCOUNTER — Other Ambulatory Visit: Payer: Self-pay | Admitting: Radiation Therapy

## 2019-06-15 DIAGNOSIS — C7931 Secondary malignant neoplasm of brain: Secondary | ICD-10-CM

## 2019-06-15 DIAGNOSIS — C7949 Secondary malignant neoplasm of other parts of nervous system: Secondary | ICD-10-CM

## 2019-06-19 ENCOUNTER — Telehealth: Payer: Self-pay | Admitting: Radiation Therapy

## 2019-06-19 ENCOUNTER — Other Ambulatory Visit: Payer: Self-pay | Admitting: Internal Medicine

## 2019-06-19 ENCOUNTER — Other Ambulatory Visit: Payer: Self-pay | Admitting: Radiation Therapy

## 2019-06-19 ENCOUNTER — Other Ambulatory Visit: Payer: Self-pay | Admitting: Thoracic Surgery (Cardiothoracic Vascular Surgery)

## 2019-06-19 DIAGNOSIS — C3491 Malignant neoplasm of unspecified part of right bronchus or lung: Secondary | ICD-10-CM

## 2019-06-19 MED ORDER — PROCHLORPERAZINE MALEATE 10 MG PO TABS: 10.0000 mg | ORAL_TABLET | Freq: Four times a day (QID) | ORAL | 0 refills | Status: DC | PRN

## 2019-06-19 NOTE — Telephone Encounter (Signed)
Spoke with pt about his upcoming brain MRI and virtual follow-up with Ashlyn to review the results.   Mont Dutton R.T.(R)(T)

## 2019-06-20 ENCOUNTER — Encounter: Payer: Self-pay | Admitting: Thoracic Surgery (Cardiothoracic Vascular Surgery)

## 2019-06-20 ENCOUNTER — Ambulatory Visit
Admission: RE | Admit: 2019-06-20 | Discharge: 2019-06-20 | Disposition: A | Discharge status: Home / Self Care | Payer: Medicare Other | Source: Ambulatory Visit | Attending: Thoracic Surgery (Cardiothoracic Vascular Surgery) | Admitting: Thoracic Surgery (Cardiothoracic Vascular Surgery)

## 2019-06-20 ENCOUNTER — Ambulatory Visit (INDEPENDENT_AMBULATORY_CARE_PROVIDER_SITE_OTHER): Payer: Medicare Other | Admitting: Thoracic Surgery (Cardiothoracic Vascular Surgery)

## 2019-06-20 ENCOUNTER — Other Ambulatory Visit: Payer: Self-pay

## 2019-06-20 ENCOUNTER — Inpatient Hospital Stay: Payer: Medicare Other

## 2019-06-20 VITALS — BP 150/94 | HR 85 | Temp 97.9°F | Resp 16 | Ht 71.0 in | Wt 201.6 lb

## 2019-06-20 DIAGNOSIS — C3491 Malignant neoplasm of unspecified part of right bronchus or lung: Secondary | ICD-10-CM

## 2019-06-20 DIAGNOSIS — J90 Pleural effusion, not elsewhere classified: Secondary | ICD-10-CM | POA: Diagnosis not present

## 2019-06-20 DIAGNOSIS — J910 Malignant pleural effusion: Secondary | ICD-10-CM | POA: Diagnosis not present

## 2019-06-20 DIAGNOSIS — C787 Secondary malignant neoplasm of liver and intrahepatic bile duct: Secondary | ICD-10-CM | POA: Diagnosis not present

## 2019-06-20 DIAGNOSIS — C3411 Malignant neoplasm of upper lobe, right bronchus or lung: Secondary | ICD-10-CM | POA: Diagnosis not present

## 2019-06-20 DIAGNOSIS — Z9689 Presence of other specified functional implants: Secondary | ICD-10-CM | POA: Diagnosis not present

## 2019-06-20 DIAGNOSIS — R05 Cough: Secondary | ICD-10-CM | POA: Diagnosis not present

## 2019-06-20 DIAGNOSIS — C3490 Malignant neoplasm of unspecified part of unspecified bronchus or lung: Secondary | ICD-10-CM | POA: Diagnosis not present

## 2019-06-20 DIAGNOSIS — C7931 Secondary malignant neoplasm of brain: Secondary | ICD-10-CM | POA: Diagnosis not present

## 2019-06-20 DIAGNOSIS — Z5111 Encounter for antineoplastic chemotherapy: Secondary | ICD-10-CM | POA: Diagnosis not present

## 2019-06-20 LAB — CMP (CANCER CENTER ONLY)
ALT: 24 U/L (ref 0–44)
AST: 23 U/L (ref 15–41)
Albumin: 3.2 g/dL — ABNORMAL LOW (ref 3.5–5.0)
Alkaline Phosphatase: 101 U/L (ref 38–126)
Anion gap: 11 (ref 5–15)
BUN: 23 mg/dL (ref 8–23)
CO2: 24 mmol/L (ref 22–32)
Calcium: 8.9 mg/dL (ref 8.9–10.3)
Chloride: 106 mmol/L (ref 98–111)
Creatinine: 1.12 mg/dL (ref 0.61–1.24)
GFR, Est AFR Am: >60 mL/min (ref >60)
GFR, Estimated: >60 mL/min (ref >60)
Glucose, Bld: 110 mg/dL — ABNORMAL HIGH (ref 70–99)
Potassium: 4 mmol/L (ref 3.5–5.1)
Sodium: 141 mmol/L (ref 135–145)
Total Bilirubin: 0.2 mg/dL — ABNORMAL LOW (ref 0.3–1.2)
Total Protein: 6.7 g/dL (ref 6.5–8.1)

## 2019-06-20 LAB — CBC WITH DIFFERENTIAL (CANCER CENTER ONLY)
Abs Immature Granulocytes: 0.01 10*3/uL (ref 0.00–0.07)
Basophils Absolute: 0 10*3/uL (ref 0.0–0.1)
Basophils Relative: 0 %
Eosinophils Absolute: 0 10*3/uL (ref 0.0–0.5)
Eosinophils Relative: 1 %
HCT: 31.7 % — ABNORMAL LOW (ref 39.0–52.0)
Hemoglobin: 10.8 g/dL — ABNORMAL LOW (ref 13.0–17.0)
Immature Granulocytes: 0 %
Lymphocytes Relative: 18 %
Lymphs Abs: 0.7 10*3/uL (ref 0.7–4.0)
MCH: 32.8 pg (ref 26.0–34.0)
MCHC: 34.1 g/dL (ref 30.0–36.0)
MCV: 96.4 fL (ref 80.0–100.0)
Monocytes Absolute: 0.4 10*3/uL (ref 0.1–1.0)
Monocytes Relative: 11 %
Neutro Abs: 2.6 10*3/uL (ref 1.7–7.7)
Neutrophils Relative %: 70 %
Platelet Count: 120 10*3/uL — ABNORMAL LOW (ref 150–400)
RBC: 3.29 MIL/uL — ABNORMAL LOW (ref 4.22–5.81)
RDW: 15.5 % (ref 11.5–15.5)
WBC Count: 3.7 10*3/uL — ABNORMAL LOW (ref 4.0–10.5)
nRBC: 0 % (ref 0.0–0.2)

## 2019-06-20 NOTE — Progress Notes (Signed)
Ethan Brown       Owosso,Ethan Brown             [phone]     HPI: Ethan Brown returns for follow-up regarding him malignant pleural effusion  Ethan Brown is a 68 year old man who was diagnosed with stage IV adenocarcinoma of the lung with a malignant right pleural effusion this summer.  He had right VATS for pleural biopsy and placement of a pleural catheter on 03/30/2019.  Molecular testing was positive for an EGFR mutation.  Unfortunately he did not have an actionable mutation.  He was treated with carboplatin, Alimta, and Keytruda.  He is now finished with the carboplatin but will continue with Alimta and Keytruda.  Initially after placement of the pleural cath, he was draining from 300 to 600 mL a day.  When I saw him on 05/23/2019 is drainage was dropping off and we went to every third day.  He since transition to once a week and has had minimal output with the last 4 drainages.  He says that he gets short of breath with exertion.  He can walk up stairs without problem but if he carries something up the stairs he gets very short of breath.  Past Medical History:  Diagnosis Date  . Borderline hypertension   . Borderline systolic hypertension   . Brain metastasis ([place]) dx'd 01/2019  . Detached vitreous humor, left   . Dyspnea   . ED (erectile dysfunction) of organic origin   . Hyperlipidemia   . Hypertension   . Lens subluxation, left   . Lung cancer ([place]) dx'd 01/2019  . Migraine   . Migraine headache    takes PRN Imitrex  . Pseudophakia   . TGA (transient global amnesia) 2017  . Transient global amnesia     Current Outpatient Medications  Medication Sig Dispense Refill  . benzonatate (TESSALON) 200 MG capsule Take 1 capsule (200 mg total) by mouth 3 (three) times daily as needed for cough. 30 capsule 0  . cyanocobalamin (,VITAMIN B-12,) 1000 MCG/ML injection INJECT 1 ML (1,000 MCG TOTAL) INTO THE MUSCLE ONCE FOR 1 DOSE. 5 mL 1  . folic acid  (FOLVITE) 1 MG tablet TAKE 1 TABLET BY MOUTH EVERY DAY 90 tablet 1  . lidocaine-prilocaine (EMLA) cream Apply to the Port-A-Cath site 30-60 minutes before treatment 30 g 0  . prochlorperazine (COMPAZINE) 10 MG tablet Take 1 tablet (10 mg total) by mouth every 6 (six) hours as needed for nausea or vomiting. 30 tablet 0  . SUMAtriptan (IMITREX) 50 MG tablet Take 50 mg by mouth every 2 (two) hours as needed for migraine.     . traMADol (ULTRAM) 50 MG tablet Take 1 tablet (50 mg total) by mouth every 6 (six) hours as needed for severe pain. 20 tablet 0   No current facility-administered medications for this visit.     Physical Exam BP (!) 150/94 (BP Location: Left Arm, Patient Position: Sitting, Cuff Size: Normal)   Pulse 85   Temp 97.9 F (36.6 C)   Resp 16   Ht 5' 11" (1.803 m)   Wt 201 lb 9.6 oz (91.4 kg)   SpO2 95% Comment: RA  BMI 28.15 kg/m  68 year old man in no acute distress Alert and oriented x3 with no focal deficits Lungs slightly diminished at right base but otherwise clear Cardiac regular rate and rhythm  Diagnostic Tests: CHEST - 2 VIEW  COMPARISON:  CT 06/05/2019.  FINDINGS:  PowerPort noted with tip over superior vena cava. Right pleural catheter drainage catheter again noted in stable position heart size normal. Right base atelectasis/infiltrate with small right pleural effusion. No pneumothorax.  IMPRESSION: 1. PowerPort and right pleural drainage catheter in stable position. No pneumothorax.  2. Persistent mild right base atelectasis/infiltrate and small right pleural effusion.   Electronically Signed   By: Thomas  Register   On: 06/20/2019 12:54 I personally reviewed the CT images and concur with the findings noted above  Impression: Ethan Brown is a 68-year-old non-smoker with stage IV adenocarcinoma of the lung with a malignant pleural effusion.  He is undergoing systemic therapy with Dr. Mohamed.  He is now finished with cisplatin but  continues on Alimta and Keytruda.  His pleural catheters had minimal drainage over the past 3 weeks.  I recommended that we cath the catheter for 3 weeks.  He will return to the office with a chest x-ray and will drain the catheter.  If there is no drainage at that time and no change in his chest x-ray will remove the catheter.  If he develops worsening shortness of breath he may try draining the catheter.  If there is no fluid then he will need to have further evaluation.  Plan: Return in 3 weeks with PA and lateral chest x-ray   C , MD [facility] [phone]    

## 2019-06-26 ENCOUNTER — Other Ambulatory Visit: Payer: Self-pay | Admitting: *Deleted

## 2019-06-26 DIAGNOSIS — C3491 Malignant neoplasm of unspecified part of right bronchus or lung: Secondary | ICD-10-CM

## 2019-06-27 ENCOUNTER — Inpatient Hospital Stay: Payer: Medicare Other

## 2019-06-27 ENCOUNTER — Inpatient Hospital Stay (HOSPITAL_BASED_OUTPATIENT_CLINIC_OR_DEPARTMENT_OTHER): Payer: Medicare Other | Admitting: Internal Medicine

## 2019-06-27 ENCOUNTER — Inpatient Hospital Stay: Payer: Medicare Other | Attending: Internal Medicine

## 2019-06-27 ENCOUNTER — Encounter: Payer: Self-pay | Admitting: Internal Medicine

## 2019-06-27 ENCOUNTER — Other Ambulatory Visit: Payer: Self-pay

## 2019-06-27 VITALS — BP 157/84 | HR 82 | Temp 98.2°F | Resp 18 | Ht 71.0 in | Wt 201.0 lb

## 2019-06-27 DIAGNOSIS — J984 Other disorders of lung: Secondary | ICD-10-CM | POA: Insufficient documentation

## 2019-06-27 DIAGNOSIS — C787 Secondary malignant neoplasm of liver and intrahepatic bile duct: Secondary | ICD-10-CM | POA: Insufficient documentation

## 2019-06-27 DIAGNOSIS — Z5111 Encounter for antineoplastic chemotherapy: Secondary | ICD-10-CM | POA: Insufficient documentation

## 2019-06-27 DIAGNOSIS — Z95828 Presence of other vascular implants and grafts: Secondary | ICD-10-CM

## 2019-06-27 DIAGNOSIS — R5382 Chronic fatigue, unspecified: Secondary | ICD-10-CM

## 2019-06-27 DIAGNOSIS — I10 Essential (primary) hypertension: Secondary | ICD-10-CM | POA: Insufficient documentation

## 2019-06-27 DIAGNOSIS — R05 Cough: Secondary | ICD-10-CM | POA: Insufficient documentation

## 2019-06-27 DIAGNOSIS — Z79899 Other long term (current) drug therapy: Secondary | ICD-10-CM | POA: Diagnosis not present

## 2019-06-27 DIAGNOSIS — C7931 Secondary malignant neoplasm of brain: Secondary | ICD-10-CM | POA: Insufficient documentation

## 2019-06-27 DIAGNOSIS — C3411 Malignant neoplasm of upper lobe, right bronchus or lung: Secondary | ICD-10-CM | POA: Diagnosis not present

## 2019-06-27 DIAGNOSIS — C3491 Malignant neoplasm of unspecified part of right bronchus or lung: Secondary | ICD-10-CM | POA: Diagnosis not present

## 2019-06-27 DIAGNOSIS — J910 Malignant pleural effusion: Secondary | ICD-10-CM | POA: Insufficient documentation

## 2019-06-27 DIAGNOSIS — E785 Hyperlipidemia, unspecified: Secondary | ICD-10-CM | POA: Insufficient documentation

## 2019-06-27 DIAGNOSIS — J90 Pleural effusion, not elsewhere classified: Secondary | ICD-10-CM | POA: Diagnosis not present

## 2019-06-27 DIAGNOSIS — Z5112 Encounter for antineoplastic immunotherapy: Secondary | ICD-10-CM | POA: Diagnosis not present

## 2019-06-27 LAB — CMP (CANCER CENTER ONLY)
ALT: 22 U/L (ref 0–44)
AST: 24 U/L (ref 15–41)
Albumin: 3.2 g/dL — ABNORMAL LOW (ref 3.5–5.0)
Alkaline Phosphatase: 92 U/L (ref 38–126)
Anion gap: 9 (ref 5–15)
BUN: 18 mg/dL (ref 8–23)
CO2: 25 mmol/L (ref 22–32)
Calcium: 8.9 mg/dL (ref 8.9–10.3)
Chloride: 106 mmol/L (ref 98–111)
Creatinine: 1.04 mg/dL (ref 0.61–1.24)
GFR, Est AFR Am: >60 mL/min (ref >60)
GFR, Estimated: >60 mL/min (ref >60)
Glucose, Bld: 90 mg/dL (ref 70–99)
Potassium: 4.2 mmol/L (ref 3.5–5.1)
Sodium: 140 mmol/L (ref 135–145)
Total Bilirubin: 0.3 mg/dL (ref 0.3–1.2)
Total Protein: 6.7 g/dL (ref 6.5–8.1)

## 2019-06-27 LAB — CBC WITH DIFFERENTIAL (CANCER CENTER ONLY)
Abs Immature Granulocytes: 0.01 10*3/uL (ref 0.00–0.07)
Basophils Absolute: 0 10*3/uL (ref 0.0–0.1)
Basophils Relative: 0 %
Eosinophils Absolute: 0 10*3/uL (ref 0.0–0.5)
Eosinophils Relative: 1 %
HCT: 32.6 % — ABNORMAL LOW (ref 39.0–52.0)
Hemoglobin: 11 g/dL — ABNORMAL LOW (ref 13.0–17.0)
Immature Granulocytes: 0 %
Lymphocytes Relative: 21 %
Lymphs Abs: 0.8 10*3/uL (ref 0.7–4.0)
MCH: 32.8 pg (ref 26.0–34.0)
MCHC: 33.7 g/dL (ref 30.0–36.0)
MCV: 97.3 fL (ref 80.0–100.0)
Monocytes Absolute: 0.4 10*3/uL (ref 0.1–1.0)
Monocytes Relative: 12 %
Neutro Abs: 2.4 10*3/uL (ref 1.7–7.7)
Neutrophils Relative %: 66 %
Platelet Count: 232 10*3/uL (ref 150–400)
RBC: 3.35 MIL/uL — ABNORMAL LOW (ref 4.22–5.81)
RDW: 16 % — ABNORMAL HIGH (ref 11.5–15.5)
WBC Count: 3.6 10*3/uL — ABNORMAL LOW (ref 4.0–10.5)
nRBC: 0 % (ref 0.0–0.2)

## 2019-06-27 LAB — TSH: TSH: 0.618 u[IU]/mL (ref 0.320–4.118)

## 2019-06-27 MED ORDER — SODIUM CHLORIDE 0.9 % IV SOLN
475.0000 mg/m2 | Freq: Once | INTRAVENOUS | Status: AC
  Administered 2019-06-27: 1000 mg via INTRAVENOUS
  Filled 2019-06-27: qty 40

## 2019-06-27 MED ORDER — SODIUM CHLORIDE 0.9% FLUSH
10.0000 mL | INTRAVENOUS | Status: DC | PRN
  Administered 2019-06-27: 10 mL
  Filled 2019-06-27: qty 10

## 2019-06-27 MED ORDER — SODIUM CHLORIDE 0.9 % IV SOLN
200.0000 mg | Freq: Once | INTRAVENOUS | Status: AC
  Administered 2019-06-27: 200 mg via INTRAVENOUS
  Filled 2019-06-27: qty 8

## 2019-06-27 MED ORDER — HEPARIN SOD (PORK) LOCK FLUSH 100 UNIT/ML IV SOLN
500.0000 [IU] | Freq: Once | INTRAVENOUS | Status: AC | PRN
  Administered 2019-06-27: 500 [IU]
  Filled 2019-06-27: qty 5

## 2019-06-27 MED ORDER — PROCHLORPERAZINE MALEATE 10 MG PO TABS
ORAL_TABLET | ORAL | Status: AC
  Filled 2019-06-27: qty 1

## 2019-06-27 MED ORDER — PROCHLORPERAZINE MALEATE 10 MG PO TABS
10.0000 mg | ORAL_TABLET | Freq: Once | ORAL | Status: AC
  Administered 2019-06-27: 10 mg via ORAL

## 2019-06-27 MED ORDER — SODIUM CHLORIDE 0.9 % IV SOLN
Freq: Once | INTRAVENOUS | Status: AC
  Administered 2019-06-27: 12:00:00 via INTRAVENOUS
  Filled 2019-06-27: qty 250

## 2019-06-27 NOTE — Progress Notes (Signed)
[facility] Telephone:(336) [phone]   Fax:(336) [phone]  OFFICE PROGRESS NOTE  Laurey Morale, MD [place] [address]  DIAGNOSIS: Stage IV (T2b, N2, M1c) presented with right middle lobe lung mass in addition to mediastinal lymphadenopathy as well as malignant right pleural effusion with pleural-based nodules and suspicious right hepatic lesion and the brain metastasis diagnosed in July 2020.  Molecular Biomarkers: [id number] (Exon 20 insertion) 0.2% Dacomitinib,Neratinib,Osimertinib  [id number] 0.1% None    PRIOR THERAPY: None  CURRENT THERAPY: Systemic chemotherapy with carboplatin for AUC of 5, Alimta 500 mg/M2 and Keytruda 200 mg IV every 3 weeks.  First dose April 04, 2019.  Status post 4 cycles .  INTERVAL HISTORY: Ethan Brown 68 y.o. male returns to the clinic today for follow-up visit.  The patient is feeling fine today with no concerning complaints except for fatigue and occasional shortness of breath with exertion.  He will try to exercise at regular basis.  He denied having any current chest pain, cough or hemoptysis.  He denied having any fever or chills.  He has no nausea, vomiting, diarrhea or constipation.  He denied having any headache or visual changes.  He continues to tolerate his treatment fairly well.  He is here today for evaluation before starting cycle #5 of his treatment.  MEDICAL HISTORY: Past Medical History:  Diagnosis Date   Borderline hypertension    Borderline systolic hypertension    Brain metastasis ([place]) dx'd 01/2019   Detached vitreous humor, left    Dyspnea    ED (erectile dysfunction) of organic origin    Hyperlipidemia    Hypertension    Lens subluxation, left    Lung cancer ([place]) dx'd 01/2019   Migraine    Migraine headache    takes PRN Imitrex   Pseudophakia    TGA (transient global amnesia) 2017   Transient global amnesia     ALLERGIES:  has No Known  Allergies.  MEDICATIONS:  Current Outpatient Medications  Medication Sig Dispense Refill   benzonatate (TESSALON) 200 MG capsule Take 1 capsule (200 mg total) by mouth 3 (three) times daily as needed for cough. 30 capsule 0   cyanocobalamin (,VITAMIN B-12,) 1000 MCG/ML injection INJECT 1 ML (1,000 MCG TOTAL) INTO THE MUSCLE ONCE FOR 1 DOSE. 5 mL 1   folic acid (FOLVITE) 1 MG tablet TAKE 1 TABLET BY MOUTH EVERY DAY 90 tablet 1   lidocaine-prilocaine (EMLA) cream Apply to the Port-A-Cath site 30-60 minutes before treatment 30 g 0   prochlorperazine (COMPAZINE) 10 MG tablet Take 1 tablet (10 mg total) by mouth every 6 (six) hours as needed for nausea or vomiting. 30 tablet 0   SUMAtriptan (IMITREX) 50 MG tablet Take 50 mg by mouth every 2 (two) hours as needed for migraine.      traMADol (ULTRAM) 50 MG tablet Take 1 tablet (50 mg total) by mouth every 6 (six) hours as needed for severe pain. 20 tablet 0   No current facility-administered medications for this visit.     SURGICAL HISTORY:  Past Surgical History:  Procedure Laterality Date   CAROTID DOPPLERS  09/2017   Mild non-obstructive disease   CATARACT EXTRACTION, BILATERAL     cataract, left  2018   CHEST TUBE INSERTION Right 03/29/2019   Procedure: INSERTION PLEURAL DRAINAGE CATHETER;  Surgeon: [name], MD;  Location: [facility];  Service: Thoracic;  Laterality: Right;   COLONOSCOPY  2016   clear,  repeat in 10 yrs    Eye surgeries     , Lens attachment.  Vitrectomy   FEMORAL HERNIA REPAIR     HERNIA REPAIR     IR THORACENTESIS ASP PLEURAL SPACE W/IMG GUIDE  02/22/2019   IR THORACENTESIS ASP PLEURAL SPACE W/IMG GUIDE  03/13/2019   LUMBAR LAMINECTOMY     PLEURAL BIOPSY Right 03/29/2019   Procedure: PLEURAL BIOPSY;  Surgeon: [name], MD;  Location: [facility];  Service: Thoracic;  Laterality: Right;   PORTACATH PLACEMENT N/A 03/29/2019   Procedure: INSERTION PORT-A-CATH;  Surgeon: [name], MD;  Location: [facility];  Service: Thoracic;  Laterality: N/A;   retinal attachment, right     sclearl buckle, right     victrectomy,right     VIDEO ASSISTED THORACOSCOPY Right 03/29/2019   Procedure: VIDEO ASSISTED THORACOSCOPY;  Surgeon: [name], MD;  Location: [facility];  Service: Thoracic;  Laterality: Right;    REVIEW OF SYSTEMS:  Constitutional: positive for fatigue Eyes: negative Ears, nose, mouth, throat, and face: negative Respiratory: positive for cough Cardiovascular: negative Gastrointestinal: negative Genitourinary:negative Integument/breast: negative Hematologic/lymphatic: negative Musculoskeletal:negative Neurological: negative Behavioral/Psych: negative Endocrine: negative Allergic/Immunologic: negative   PHYSICAL EXAMINATION: General appearance: alert, cooperative, fatigued and no distress Head: Normocephalic, without obvious abnormality, atraumatic Neck: no adenopathy, no JVD, supple, symmetrical, trachea midline and thyroid not enlarged, symmetric, no tenderness/mass/nodules Lymph nodes: Cervical, supraclavicular, and axillary nodes normal. Resp: clear to auscultation bilaterally Back: symmetric, no curvature. ROM normal. No CVA tenderness. Cardio: regular rate and rhythm, S1, S2 normal, no murmur, click, rub or gallop GI: soft, non-tender; bowel sounds normal; no masses,  no organomegaly Extremities: extremities normal, atraumatic, no cyanosis or edema Neurologic: Alert and oriented X 3, normal strength and tone. Normal symmetric reflexes. Normal coordination and gait  ECOG PERFORMANCE STATUS: 1 - Symptomatic but completely ambulatory  Blood pressure (!) 157/84, pulse 82, temperature 98.2 F (36.8 C), temperature source Temporal, resp. rate 18, height '5\' 11"'  (1.803 m), weight 201 lb (91.2 kg), SpO2 99 %.  LABORATORY DATA: Lab Results  Component Value Date   WBC 3.6 (L) 06/27/2019   HGB 11.0 (L) 06/27/2019   HCT 32.6 (L) 06/27/2019   MCV  97.3 06/27/2019   PLT 232 06/27/2019      Chemistry      Component Value Date/Time   NA 141 06/20/2019 1531   K 4.0 06/20/2019 1531   CL 106 06/20/2019 1531   CO2 24 06/20/2019 1531   BUN 23 06/20/2019 1531   CREATININE 1.12 06/20/2019 1531      Component Value Date/Time   CALCIUM 8.9 06/20/2019 1531   ALKPHOS 101 06/20/2019 1531   AST 23 06/20/2019 1531   ALT 24 06/20/2019 1531   BILITOT 0.2 (L) 06/20/2019 1531       RADIOGRAPHIC STUDIES: Dg Chest 2 View  Result Date: 06/20/2019 CLINICAL DATA:  Lung cancer. EXAM: CHEST - 2 VIEW COMPARISON:  CT 06/05/2019. FINDINGS: PowerPort noted with tip over superior vena cava. Right pleural catheter drainage catheter again noted in stable position heart size normal. Right base atelectasis/infiltrate with small right pleural effusion. No pneumothorax. IMPRESSION: 1. PowerPort and right pleural drainage catheter in stable position. No pneumothorax. 2. Persistent mild right base atelectasis/infiltrate and small right pleural effusion. Electronically Signed   By: Marcello Moores  Register   On: 06/20/2019 12:54   Ct Chest W Contrast  Result Date: 06/05/2019 CLINICAL DATA:  Non-small-cell lung cancer.  Restaging. EXAM: CT CHEST, ABDOMEN, AND  PELVIS WITH CONTRAST TECHNIQUE: Multidetector CT imaging of the chest, abdomen and pelvis was performed following the standard protocol during bolus administration of intravenous contrast. CONTRAST:  185m OMNIPAQUE IOHEXOL 300 MG/ML  SOLN COMPARISON:  PET-CT 03/27/2019 FINDINGS: CT CHEST FINDINGS Cardiovascular: The heart size is normal. No substantial pericardial effusion. No thoracic aortic aneurysm. Right Port-A-Cath tip is positioned in the mid SVC. Mediastinum/Nodes: 15 mm short axis precarinal node was 18 mm previously. 7 mm short axis para-aortic node (31/2) was 9 mm previously. Lymphoid tissue noted in both hilar regions without lymphadenopathy. There is no axillary lymphadenopathy. Lungs/Pleura: Large right  pleural effusion seen previously has decreased substantially in the interval with some residual loculated fluid noted in the right major fissure. Nodularity is seen in the major and minor fissures of the right hemithorax. Right middle lobe mass measures 1.8 x 2.3 cm today decreased from 2.5 x 3.0 cm when I remeasure on the 02/09/2019 exam. This lesion was partially obscured on the PET-CT of 03/27/2019. There is some residual collapse/consolidative change in the right lower lobe. Tiny subpleural nodule left lower lobe (132/6) stable since 02/09/2019. Similar tiny left lower lobe subpleural nodule on 01/20 2/6 is also stable. Right pleural drain visualized in situ. Areas of pleural nodularity are seen in the right hemithorax. Musculoskeletal: No worrisome lytic or sclerotic osseous abnormality. CT ABDOMEN PELVIS FINDINGS Hepatobiliary: The segment VII lesion seen on PET-CT and measuring 2 cm is decreased in the interval measuring 9 mm today (image 59/2). The 5 mm retrocaval node seen on previous PET-CT is stable on 74/2 today. There is no evidence for gallstones, gallbladder wall thickening, or pericholecystic fluid. No intrahepatic or extrahepatic biliary dilation. Pancreas: No focal mass lesion. No dilatation of the main duct. No intraparenchymal cyst. No peripancreatic edema. Spleen: No splenomegaly. No focal mass lesion. Adrenals/Urinary Tract: No adrenal nodule or mass. Kidneys unremarkable. No evidence for hydroureter. The urinary bladder appears normal for the degree of distention. Stomach/Bowel: Stomach is unremarkable. No gastric wall thickening. No evidence of outlet obstruction. Duodenum is normally positioned as is the ligament of Treitz. No small bowel wall thickening. No small bowel dilatation. The terminal ileum is normal. The appendix is normal. No gross colonic mass. No colonic wall thickening. Diverticular changes are noted in the left colon without evidence of diverticulitis. Vascular/Lymphatic: No  abdominal aortic aneurysm. No abdominal aortic atherosclerotic calcification. There is no gastrohepatic or hepatoduodenal ligament lymphadenopathy. No intraperitoneal or retroperitoneal lymphadenopathy. No pelvic sidewall lymphadenopathy. Reproductive: Prostate gland is enlarged. Other: No intraperitoneal free fluid. Musculoskeletal: No worrisome lytic or sclerotic osseous abnormality. IMPRESSION: 1. Interval decrease in right pleural effusion with mild decrease in mediastinal and right hilar lymphadenopathy. 2. Right middle lobe pulmonary lesion has decreased in size since 02/09/2019 and was partially obscured on the intervening PET-CT. 3. Residual nodularity is seen in the major and minor fissures of the right lung with associated pleural nodularity, consistent with metastatic disease. 4. The hypermetabolic lesion seen in segment VII of the liver on previous PET-CT has decreased substantially in size in the interval, now measuring 9 mm. The 5 mm hypermetabolic retrocaval lymph node seen on PET-CT is stable in size. No new or progressive findings in the abdomen/pelvis. Electronically Signed   By: EMisty StanleyM.D.   On: 06/05/2019 11:53   Ct Abdomen Pelvis W Contrast  Result Date: 06/05/2019 CLINICAL DATA:  Non-small-cell lung cancer.  Restaging. EXAM: CT CHEST, ABDOMEN, AND PELVIS WITH CONTRAST TECHNIQUE: Multidetector CT imaging of the chest, abdomen  and pelvis was performed following the standard protocol during bolus administration of intravenous contrast. CONTRAST:  167m OMNIPAQUE IOHEXOL 300 MG/ML  SOLN COMPARISON:  PET-CT 03/27/2019 FINDINGS: CT CHEST FINDINGS Cardiovascular: The heart size is normal. No substantial pericardial effusion. No thoracic aortic aneurysm. Right Port-A-Cath tip is positioned in the mid SVC. Mediastinum/Nodes: 15 mm short axis precarinal node was 18 mm previously. 7 mm short axis para-aortic node (31/2) was 9 mm previously. Lymphoid tissue noted in both hilar regions without  lymphadenopathy. There is no axillary lymphadenopathy. Lungs/Pleura: Large right pleural effusion seen previously has decreased substantially in the interval with some residual loculated fluid noted in the right major fissure. Nodularity is seen in the major and minor fissures of the right hemithorax. Right middle lobe mass measures 1.8 x 2.3 cm today decreased from 2.5 x 3.0 cm when I remeasure on the 02/09/2019 exam. This lesion was partially obscured on the PET-CT of 03/27/2019. There is some residual collapse/consolidative change in the right lower lobe. Tiny subpleural nodule left lower lobe (132/6) stable since 02/09/2019. Similar tiny left lower lobe subpleural nodule on 01/20 2/6 is also stable. Right pleural drain visualized in situ. Areas of pleural nodularity are seen in the right hemithorax. Musculoskeletal: No worrisome lytic or sclerotic osseous abnormality. CT ABDOMEN PELVIS FINDINGS Hepatobiliary: The segment VII lesion seen on PET-CT and measuring 2 cm is decreased in the interval measuring 9 mm today (image 59/2). The 5 mm retrocaval node seen on previous PET-CT is stable on 74/2 today. There is no evidence for gallstones, gallbladder wall thickening, or pericholecystic fluid. No intrahepatic or extrahepatic biliary dilation. Pancreas: No focal mass lesion. No dilatation of the main duct. No intraparenchymal cyst. No peripancreatic edema. Spleen: No splenomegaly. No focal mass lesion. Adrenals/Urinary Tract: No adrenal nodule or mass. Kidneys unremarkable. No evidence for hydroureter. The urinary bladder appears normal for the degree of distention. Stomach/Bowel: Stomach is unremarkable. No gastric wall thickening. No evidence of outlet obstruction. Duodenum is normally positioned as is the ligament of Treitz. No small bowel wall thickening. No small bowel dilatation. The terminal ileum is normal. The appendix is normal. No gross colonic mass. No colonic wall thickening. Diverticular changes are  noted in the left colon without evidence of diverticulitis. Vascular/Lymphatic: No abdominal aortic aneurysm. No abdominal aortic atherosclerotic calcification. There is no gastrohepatic or hepatoduodenal ligament lymphadenopathy. No intraperitoneal or retroperitoneal lymphadenopathy. No pelvic sidewall lymphadenopathy. Reproductive: Prostate gland is enlarged. Other: No intraperitoneal free fluid. Musculoskeletal: No worrisome lytic or sclerotic osseous abnormality. IMPRESSION: 1. Interval decrease in right pleural effusion with mild decrease in mediastinal and right hilar lymphadenopathy. 2. Right middle lobe pulmonary lesion has decreased in size since 02/09/2019 and was partially obscured on the intervening PET-CT. 3. Residual nodularity is seen in the major and minor fissures of the right lung with associated pleural nodularity, consistent with metastatic disease. 4. The hypermetabolic lesion seen in segment VII of the liver on previous PET-CT has decreased substantially in size in the interval, now measuring 9 mm. The 5 mm hypermetabolic retrocaval lymph node seen on PET-CT is stable in size. No new or progressive findings in the abdomen/pelvis. Electronically Signed   By: EMisty StanleyM.D.   On: 06/05/2019 11:53    ASSESSMENT AND PLAN: This is a very pleasant 68years old white male recently diagnosed with a stage IV (T2b, N2, M1C) non-small cell lung cancer, adenocarcinoma with positive EGFR mutation in exon 20 (resistant mutation) diagnosed in July 2020 and presented with  extensive disease involving the right upper lobe as well as the right middle and lower lobe with extensive pleural based metastasis as well as mediastinal and hilar disease with malignant pleural effusion as well as liver and brain metastasis. Unfortunately the patient has no actionable mutations based on the molecular studies by [facility] [employer].   The patient is currently undergoing systemic chemotherapy with carboplatin for AUC of 5,  Alimta 500 mg/M2 and Keytruda 200 mg IV every 3 weeks status post 4 cycles. Starting from cycle #5 the patient will be on treatment with maintenance Alimta and Keytruda. He tolerated the last cycle of his treatment fairly well with no concerning adverse effects. I recommended for him to proceed with cycle #5 today as planned. The patient had several questions again about his prognosis as well as exercise and sexual activity and I answered them completely to his satisfaction. For the dry cough, he will continue on Tessalon capsules to be used 3 times a day as needed.  I will give him refill of his medication today. He will come back for follow-up visit in 3 weeks for evaluation before the next cycle of his treatment. The patient was advised to call immediately if he has any concerning symptoms in the interval. The patient voices understanding of current disease status and treatment options and is in agreement with the current care plan. All questions were answered. The patient knows to call the clinic with any problems, questions or concerns. We can certainly see the patient much sooner if necessary. I spent 15 minutes counseling the patient face to face. The total time spent in the appointment was 25 minutes.  Disclaimer: This note was dictated with voice recognition software. Similar sounding words can inadvertently be transcribed and may not be corrected upon review.

## 2019-06-27 NOTE — Progress Notes (Signed)
Confirmed that pt still gets B12 inj at his PCP office. B12 orders removed from tx plan.  Kennith Center, Pharm.D., CPP 06/27/2019@12 :09 PM

## 2019-06-27 NOTE — Patient Instructions (Signed)
[facility] Discharge Instructions for Patients Receiving Chemotherapy  Today you received the following chemotherapy agents Keytruda, Alimta  To help prevent nausea and vomiting after your treatment, we encourage you to take your nausea medication.   If you develop nausea and vomiting that is not controlled by your nausea medication, call the clinic.   BELOW ARE SYMPTOMS THAT SHOULD BE REPORTED IMMEDIATELY:  *FEVER GREATER THAN 100.5 F  *CHILLS WITH OR WITHOUT FEVER  NAUSEA AND VOMITING THAT IS NOT CONTROLLED WITH YOUR NAUSEA MEDICATION  *UNUSUAL SHORTNESS OF BREATH  *UNUSUAL BRUISING OR BLEEDING  TENDERNESS IN MOUTH AND THROAT WITH OR WITHOUT PRESENCE OF ULCERS  *URINARY PROBLEMS  *BOWEL PROBLEMS  UNUSUAL RASH Items with * indicate a potential emergency and should be followed up as soon as possible.  Feel free to call the clinic should you have any questions or concerns. The clinic phone number is [phone].  Please show the [facility] at check-in to the Emergency Department and triage nurse.

## 2019-06-28 ENCOUNTER — Telehealth: Payer: Self-pay | Admitting: Internal Medicine

## 2019-06-28 NOTE — Telephone Encounter (Signed)
Scheduled per los. Called and left msg. Mailed printout  °

## 2019-07-06 ENCOUNTER — Other Ambulatory Visit: Payer: Self-pay

## 2019-07-06 ENCOUNTER — Ambulatory Visit
Admission: RE | Admit: 2019-07-06 | Discharge: 2019-07-06 | Disposition: A | Discharge status: Home / Self Care | Payer: Medicare Other | Source: Ambulatory Visit | Attending: Radiation Oncology | Admitting: Radiation Oncology

## 2019-07-06 DIAGNOSIS — C7931 Secondary malignant neoplasm of brain: Secondary | ICD-10-CM

## 2019-07-06 DIAGNOSIS — C7800 Secondary malignant neoplasm of unspecified lung: Secondary | ICD-10-CM | POA: Diagnosis not present

## 2019-07-06 DIAGNOSIS — C7949 Secondary malignant neoplasm of other parts of nervous system: Secondary | ICD-10-CM

## 2019-07-06 DIAGNOSIS — C801 Malignant (primary) neoplasm, unspecified: Secondary | ICD-10-CM | POA: Diagnosis not present

## 2019-07-06 MED ORDER — GADOBENATE DIMEGLUMINE 529 MG/ML IV SOLN
19.0000 mL | Freq: Once | INTRAVENOUS | Status: AC | PRN
  Administered 2019-07-06: 10:00:00 19 mL via INTRAVENOUS

## 2019-07-07 ENCOUNTER — Other Ambulatory Visit: Payer: Self-pay | Admitting: Thoracic Surgery (Cardiothoracic Vascular Surgery)

## 2019-07-07 DIAGNOSIS — J90 Pleural effusion, not elsewhere classified: Secondary | ICD-10-CM

## 2019-07-11 ENCOUNTER — Other Ambulatory Visit: Payer: Self-pay

## 2019-07-11 ENCOUNTER — Ambulatory Visit
Admission: RE | Admit: 2019-07-11 | Discharge: 2019-07-11 | Disposition: A | Discharge status: Home / Self Care | Payer: Medicare Other | Source: Ambulatory Visit | Attending: Thoracic Surgery (Cardiothoracic Vascular Surgery) | Admitting: Thoracic Surgery (Cardiothoracic Vascular Surgery)

## 2019-07-11 ENCOUNTER — Encounter: Payer: Self-pay | Admitting: Thoracic Surgery (Cardiothoracic Vascular Surgery)

## 2019-07-11 ENCOUNTER — Ambulatory Visit (INDEPENDENT_AMBULATORY_CARE_PROVIDER_SITE_OTHER): Payer: Medicare Other | Admitting: Thoracic Surgery (Cardiothoracic Vascular Surgery)

## 2019-07-11 VITALS — BP 157/92 | HR 75 | Temp 97.5°F | Resp 16 | Ht 71.0 in | Wt 198.2 lb

## 2019-07-11 DIAGNOSIS — C3491 Malignant neoplasm of unspecified part of right bronchus or lung: Secondary | ICD-10-CM | POA: Diagnosis not present

## 2019-07-11 DIAGNOSIS — Z9689 Presence of other specified functional implants: Secondary | ICD-10-CM | POA: Diagnosis not present

## 2019-07-11 DIAGNOSIS — J910 Malignant pleural effusion: Secondary | ICD-10-CM

## 2019-07-11 DIAGNOSIS — C3490 Malignant neoplasm of unspecified part of unspecified bronchus or lung: Secondary | ICD-10-CM | POA: Diagnosis not present

## 2019-07-11 DIAGNOSIS — J90 Pleural effusion, not elsewhere classified: Secondary | ICD-10-CM

## 2019-07-11 NOTE — Progress Notes (Signed)
[address]       [place] [address]             [phone]       HPI: Mr. Ishmael returns for a scheduled follow-up visit  Usman Millett is a 68 year old man diagnosed with stage IV adenocarcinoma of the right lung with a malignant right pleural effusion this past summer.  He had a right VATS with pleural biopsy and placement of a pleural catheter in August.  Molecular testing was positive for EGFR.  He was treated initially with carboplatin, Alimta and Keytruda.  He currently is on Alimta and Keytruda.  Early on after placing the pleural catheter he was draining between 300 and 600 mL a day.  By late September the drainage had dropped off significantly.  I saw him 3 weeks ago and he was having minimal drainage.  He has not drain the catheter since then.  His respiratory status has remained unchanged.  Past Medical History:  Diagnosis Date  . Borderline hypertension   . Borderline systolic hypertension   . Brain metastasis ([place]) dx'd 01/2019  . Detached vitreous humor, left   . Dyspnea   . ED (erectile dysfunction) of organic origin   . Hyperlipidemia   . Hypertension   . Lens subluxation, left   . Lung cancer ([place]) dx'd 01/2019  . Migraine   . Migraine headache    takes PRN Imitrex  . Pseudophakia   . TGA (transient global amnesia) 2017  . Transient global amnesia     Current Outpatient Medications  Medication Sig Dispense Refill  . benzonatate (TESSALON) 200 MG capsule Take 1 capsule (200 mg total) by mouth 3 (three) times daily as needed for cough. 30 capsule 0  . cyanocobalamin (,VITAMIN B-12,) 1000 MCG/ML injection INJECT 1 ML (1,000 MCG TOTAL) INTO THE MUSCLE ONCE FOR 1 DOSE. 5 mL 1  . folic acid (FOLVITE) 1 MG tablet TAKE 1 TABLET BY MOUTH EVERY DAY 90 tablet 1  . lidocaine-prilocaine (EMLA) cream Apply to the Port-A-Cath site 30-60 minutes before treatment 30 g 0  . prochlorperazine (COMPAZINE) 10 MG tablet Take 1 tablet (10 mg total) by mouth  every 6 (six) hours as needed for nausea or vomiting. 30 tablet 0  . SUMAtriptan (IMITREX) 50 MG tablet Take 50 mg by mouth every 2 (two) hours as needed for migraine.     . traMADol (ULTRAM) 50 MG tablet Take 1 tablet (50 mg total) by mouth every 6 (six) hours as needed for severe pain. 20 tablet 0   No current facility-administered medications for this visit.     Physical Exam BP (!) 157/92 (BP Location: Right Arm, Patient Position: Sitting, Cuff Size: Normal)   Pulse 75   Temp (!) 97.5 F (36.4 C)   Resp 16   Ht 5' 11" (1.803 m)   Wt 198 lb 3.2 oz (89.9 kg)   SpO2 98% Comment: RA  BMI 27.49 kg/m  68 year old man in no acute distress Alert and oriented x3 with no focal deficits Lungs slightly diminished at right base Pleural catheter site clean dry and intact  Diagnostic Tests: CHEST - 2 VIEW  COMPARISON:  06/20/2019  FINDINGS: Small right pleural effusion unchanged. PleurX catheter on the right unchanged. No pneumothorax. Right lower lobe airspace disease unchanged.  Port-A-Cath tip SVC. Left lung remains clear. Heart size and vascularity normal.  IMPRESSION: No interval change in small right effusion and PleurX catheter. Right lower lobe airspace  disease unchanged.   Electronically Signed   By: Franchot Gallo M.D.   On: 07/11/2019 10:29 I personally reviewed the chest x-ray images and concur with the findings noted above  Impression: Reino Lybbert is a 68 year old gentleman with stage IV adenocarcinoma of the lung with a malignant right pleural effusion.  He has had a pleural catheter in for about 3 months now.  He has had essentially no drainage over the past 6 weeks and his chest x-ray has remained unchanged.  We will remove the catheter today.  Procedure Sterile technique utilized.  Local anesthesia with 1% lidocaine.  Pleural catheter removed intact without difficulty.  Patient tolerated well.  Plan: Return in 1 month with PA and lateral chest x-ray   [name], MD [facility] [phone]

## 2019-07-12 ENCOUNTER — Encounter: Payer: Self-pay | Admitting: Urology

## 2019-07-12 ENCOUNTER — Ambulatory Visit
Admission: RE | Admit: 2019-07-12 | Discharge: 2019-07-12 | Disposition: A | Discharge status: Home / Self Care | Payer: Medicare Other | Source: Ambulatory Visit | Attending: Urology | Admitting: Urology

## 2019-07-12 ENCOUNTER — Other Ambulatory Visit: Payer: Self-pay

## 2019-07-12 DIAGNOSIS — Z08 Encounter for follow-up examination after completed treatment for malignant neoplasm: Secondary | ICD-10-CM | POA: Diagnosis not present

## 2019-07-12 DIAGNOSIS — C7931 Secondary malignant neoplasm of brain: Secondary | ICD-10-CM | POA: Diagnosis not present

## 2019-07-12 DIAGNOSIS — C342 Malignant neoplasm of middle lobe, bronchus or lung: Secondary | ICD-10-CM | POA: Diagnosis not present

## 2019-07-12 DIAGNOSIS — C3490 Malignant neoplasm of unspecified part of unspecified bronchus or lung: Secondary | ICD-10-CM

## 2019-07-12 NOTE — Progress Notes (Addendum)
Radiation Oncology         [phone] ________________________________  Name: Ethan Brown MRN: [id number]  Date: 07/12/2019  DOB: Jan 25, 1951  Post Treatment Note  CC: Laurey Morale, MD  Laurey Morale, MD  Diagnosis:   68 yo man with a solitary 6.2 mm right parietal brain metastasis from Stage IV (T2b, N2, M1c) adenocarcinoma of the right middle lobe of the lung.     Interval Since Last Radiation:  3 months   04/07/19: SRS to a 6 mm  right parietal target treated to a prescription dose of 20 Gy in a single fraction.    Narrative:  I spoke with the patient to conduct his routine scheduled 3 month follow up visit via MyChart video enabled format to spare the patient unnecessary potential exposure in the healthcare setting during the current COVID-19 pandemic.  The patient was notified in advance and gave permission to proceed with this visit format.   He tolerated radiation treatment relatively well without any adverse side effects and remains without complaints.  His visit today is to discuss the results from his follow-up MRI brain scan performed on 07/06/2019 which shows decrease in size of the treated right parietal lesion and no evidence of new metastases.  He has been on systemic chemotherapy with carboplatin, Alimta and Keytruda every 3 weeks under the care and direction of Dr. Earlie Server and completed 4 cycles.  His recent follow-up systemic imaging from 06/05/2019 showed a good response to treatment with a decrease in the right pleural effusion, decreased mediastinal and right hilar lymphadenopathy, and improvement in the liver lesion and right middle lobe pulmonary lesion.  He was recently started on maintenance therapy with Alimta and Keytruda, beginning with cycle 5 of his treatment on 06/27/2019 which he tolerates well.                 On review of systems, the patient states that he is doing very well overall.  He is currently without complaints and specifically denies  headaches, changes in auditory or visual acuity, nausea, vomiting, dizziness, imbalance, tremors or seizure activity.  He has not had recent fevers, chills or night sweats.  He is tolerating his systemic therapy quite well and is overall very pleased with his progress to date.  ALLERGIES:  has No Known Allergies.  Meds: Current Outpatient Medications  Medication Sig Dispense Refill  . cyanocobalamin (,VITAMIN B-12,) 1000 MCG/ML injection INJECT 1 ML (1,000 MCG TOTAL) INTO THE MUSCLE ONCE FOR 1 DOSE. 5 mL 1  . folic acid (FOLVITE) 1 MG tablet TAKE 1 TABLET BY MOUTH EVERY DAY 90 tablet 1  . lidocaine-prilocaine (EMLA) cream Apply to the Port-A-Cath site 30-60 minutes before treatment 30 g 0  . prochlorperazine (COMPAZINE) 10 MG tablet Take 1 tablet (10 mg total) by mouth every 6 (six) hours as needed for nausea or vomiting. 30 tablet 0  . benzonatate (TESSALON) 200 MG capsule Take 1 capsule (200 mg total) by mouth 3 (three) times daily as needed for cough. (Patient not taking: Reported on 07/12/2019) 30 capsule 0  . SUMAtriptan (IMITREX) 50 MG tablet Take 50 mg by mouth every 2 (two) hours as needed for migraine.     . traMADol (ULTRAM) 50 MG tablet Take 1 tablet (50 mg total) by mouth every 6 (six) hours as needed for severe pain. (Patient not taking: Reported on 07/12/2019) 20 tablet 0   No current facility-administered medications for this encounter.     Physical Findings:  Unable to assess due to telephone follow-up visit format.  Lab Findings: Lab Results  Component Value Date   WBC 3.6 (L) 06/27/2019   HGB 11.0 (L) 06/27/2019   HCT 32.6 (L) 06/27/2019   MCV 97.3 06/27/2019   PLT 232 06/27/2019     Radiographic Findings: Dg Chest 2 View  Result Date: 07/11/2019 CLINICAL DATA:  Lung cancer.  Follow-up pleural effusion. EXAM: CHEST - 2 VIEW COMPARISON:  06/20/2019 FINDINGS: Small right pleural effusion unchanged. PleurX catheter on the right unchanged. No pneumothorax. Right lower  lobe airspace disease unchanged. Port-A-Cath tip SVC. Left lung remains clear. Heart size and vascularity normal. IMPRESSION: No interval change in small right effusion and PleurX catheter. Right lower lobe airspace disease unchanged. Electronically Signed   By: Franchot Gallo M.D.   On: 07/11/2019 10:29   Dg Chest 2 View  Result Date: 06/20/2019 CLINICAL DATA:  Lung cancer. EXAM: CHEST - 2 VIEW COMPARISON:  CT 06/05/2019. FINDINGS: PowerPort noted with tip over superior vena cava. Right pleural catheter drainage catheter again noted in stable position heart size normal. Right base atelectasis/infiltrate with small right pleural effusion. No pneumothorax. IMPRESSION: 1. PowerPort and right pleural drainage catheter in stable position. No pneumothorax. 2. Persistent mild right base atelectasis/infiltrate and small right pleural effusion. Electronically Signed   By: Marcello Moores  Register   On: 06/20/2019 12:54   Mr Brain W Wo Contrast  Result Date: 07/06/2019 CLINICAL DATA:  Metastatic lung cancer post SRS, follow-up EXAM: MRI HEAD WITHOUT AND WITH CONTRAST TECHNIQUE: Multiplanar, multiecho pulse sequences of the brain and surrounding structures were obtained without and with intravenous contrast. CONTRAST:  35mL MULTIHANCE GADOBENATE DIMEGLUMINE 529 MG/ML IV SOLN COMPARISON:  March 31, 2019 FINDINGS: Brain: Decrease in size of right postcentral gyrus lesion now measuring 3 mm (previously 6 mm). Adjacent T2 FLAIR hyperintensity has resolved. As before, there is associated susceptibility likely reflecting chronic blood products. There is no new mass or abnormal enhancement. There is no acute infarction. Two small foci of susceptibility are again identified in the left parietal lobe compatible with chronic microhemorrhages or mineralization. Patchy small foci of T2 hyperintensity in the supratentorial white matter are nonspecific but may reflect minor chronic microvascular ischemic changes. There is no  hydrocephalus or extra-axial fluid collection. Vascular: Major vessel flow voids at the skull base are preserved. Skull and upper cervical spine: Normal marrow signal is preserved. Sinuses/Orbits: Minor paranasal sinus mucosal thickening. Orbits are unremarkable. Other: Sella is unremarkable.  Mastoid air cells are clear. IMPRESSION: Decrease in size of treated right parietal lesion. No new metastasis. Electronically Signed   By: Macy Mis M.D.   On: 07/06/2019 11:22    Impression/Plan: 58. 68 yo man with a solitary 6.2 mm right parietal brain metastasis from Stage IV (T2b, N2, M1c) adenocarcinoma of the right middle lobe of the lung.    The patient appears to have recovered well from the effects of his recent [facility] treatment.  He is currently without complaints.  We discussed the plan to proceed with serial MRI brain scans every 3 months going forward for the first 2 years and then on a six-month basis thereafter.  His case and imaging will be reviewed in the multidisciplinary brain tumor board conferences and we will follow-up thereafter to review results and recommendations.  He appears to have a good understanding and is comfortable and in agreement with the stated plan.  He knows to call at anytime in the interim with any questions or concerns related  to his previous radiotherapy.  Given current concerns for patient exposure during the COVID-19 pandemic, this encounter was conducted via [place] virtual meeting to allow for face to face communication. The patient was notified in advance and has given verbal consent for this type of encounter. The time spent during this encounter was 20 minutes with 50% of that time spent in counseling and/or coordination of care. The attendants for this meeting include Nicol Herbig PA-C and patient, Anthone Prieur. During the encounter, Naol Ontiveros PA-C, was located at [facility] Radiation Oncology Department.  Patient, Ethan Brown was  located at home.   Nicholos Johns, PA-C

## 2019-07-17 ENCOUNTER — Other Ambulatory Visit: Payer: Self-pay | Admitting: *Deleted

## 2019-07-17 DIAGNOSIS — C3491 Malignant neoplasm of unspecified part of right bronchus or lung: Secondary | ICD-10-CM

## 2019-07-18 ENCOUNTER — Other Ambulatory Visit: Payer: Self-pay

## 2019-07-18 ENCOUNTER — Inpatient Hospital Stay: Payer: Medicare Other

## 2019-07-18 ENCOUNTER — Inpatient Hospital Stay (HOSPITAL_BASED_OUTPATIENT_CLINIC_OR_DEPARTMENT_OTHER): Payer: Medicare Other | Admitting: Internal Medicine

## 2019-07-18 ENCOUNTER — Encounter: Payer: Self-pay | Admitting: Internal Medicine

## 2019-07-18 VITALS — BP 136/84 | HR 76 | Temp 98.3°F | Resp 18 | Ht 71.0 in | Wt 196.7 lb

## 2019-07-18 DIAGNOSIS — C3490 Malignant neoplasm of unspecified part of unspecified bronchus or lung: Secondary | ICD-10-CM

## 2019-07-18 DIAGNOSIS — C3491 Malignant neoplasm of unspecified part of right bronchus or lung: Secondary | ICD-10-CM

## 2019-07-18 DIAGNOSIS — C7931 Secondary malignant neoplasm of brain: Secondary | ICD-10-CM

## 2019-07-18 DIAGNOSIS — Z95828 Presence of other vascular implants and grafts: Secondary | ICD-10-CM

## 2019-07-18 DIAGNOSIS — C787 Secondary malignant neoplasm of liver and intrahepatic bile duct: Secondary | ICD-10-CM | POA: Diagnosis not present

## 2019-07-18 DIAGNOSIS — J984 Other disorders of lung: Secondary | ICD-10-CM | POA: Diagnosis not present

## 2019-07-18 DIAGNOSIS — C3411 Malignant neoplasm of upper lobe, right bronchus or lung: Secondary | ICD-10-CM | POA: Diagnosis not present

## 2019-07-18 DIAGNOSIS — Z5112 Encounter for antineoplastic immunotherapy: Secondary | ICD-10-CM | POA: Diagnosis not present

## 2019-07-18 DIAGNOSIS — R5382 Chronic fatigue, unspecified: Secondary | ICD-10-CM

## 2019-07-18 DIAGNOSIS — Z5111 Encounter for antineoplastic chemotherapy: Secondary | ICD-10-CM

## 2019-07-18 DIAGNOSIS — J910 Malignant pleural effusion: Secondary | ICD-10-CM | POA: Diagnosis not present

## 2019-07-18 LAB — CBC WITH DIFFERENTIAL (CANCER CENTER ONLY)
Abs Immature Granulocytes: 0.01 10*3/uL (ref 0.00–0.07)
Basophils Absolute: 0 10*3/uL (ref 0.0–0.1)
Basophils Relative: 1 %
Eosinophils Absolute: 0 10*3/uL (ref 0.0–0.5)
Eosinophils Relative: 1 %
HCT: 35.1 % — ABNORMAL LOW (ref 39.0–52.0)
Hemoglobin: 11.5 g/dL — ABNORMAL LOW (ref 13.0–17.0)
Immature Granulocytes: 0 %
Lymphocytes Relative: 17 %
Lymphs Abs: 0.8 10*3/uL (ref 0.7–4.0)
MCH: 32.7 pg (ref 26.0–34.0)
MCHC: 32.8 g/dL (ref 30.0–36.0)
MCV: 99.7 fL (ref 80.0–100.0)
Monocytes Absolute: 0.4 10*3/uL (ref 0.1–1.0)
Monocytes Relative: 9 %
Neutro Abs: 3.2 10*3/uL (ref 1.7–7.7)
Neutrophils Relative %: 72 %
Platelet Count: 302 10*3/uL (ref 150–400)
RBC: 3.52 MIL/uL — ABNORMAL LOW (ref 4.22–5.81)
RDW: 14.8 % (ref 11.5–15.5)
WBC Count: 4.5 10*3/uL (ref 4.0–10.5)
nRBC: 0 % (ref 0.0–0.2)

## 2019-07-18 LAB — CMP (CANCER CENTER ONLY)
ALT: 23 U/L (ref 0–44)
AST: 28 U/L (ref 15–41)
Albumin: 3.4 g/dL — ABNORMAL LOW (ref 3.5–5.0)
Alkaline Phosphatase: 92 U/L (ref 38–126)
Anion gap: 8 (ref 5–15)
BUN: 16 mg/dL (ref 8–23)
CO2: 26 mmol/L (ref 22–32)
Calcium: 8.8 mg/dL — ABNORMAL LOW (ref 8.9–10.3)
Chloride: 106 mmol/L (ref 98–111)
Creatinine: 0.98 mg/dL (ref 0.61–1.24)
GFR, Est AFR Am: >60 mL/min (ref >60)
GFR, Estimated: >60 mL/min (ref >60)
Glucose, Bld: 95 mg/dL (ref 70–99)
Potassium: 4.2 mmol/L (ref 3.5–5.1)
Sodium: 140 mmol/L (ref 135–145)
Total Bilirubin: 0.3 mg/dL (ref 0.3–1.2)
Total Protein: 6.7 g/dL (ref 6.5–8.1)

## 2019-07-18 LAB — TSH: TSH: 1.015 u[IU]/mL (ref 0.320–4.118)

## 2019-07-18 MED ORDER — SODIUM CHLORIDE 0.9 % IV SOLN
200.0000 mg | Freq: Once | INTRAVENOUS | Status: AC
  Administered 2019-07-18: 200 mg via INTRAVENOUS
  Filled 2019-07-18: qty 8

## 2019-07-18 MED ORDER — HEPARIN SOD (PORK) LOCK FLUSH 100 UNIT/ML IV SOLN
500.0000 [IU] | Freq: Once | INTRAVENOUS | Status: AC | PRN
  Administered 2019-07-18: 500 [IU]
  Filled 2019-07-18: qty 5

## 2019-07-18 MED ORDER — PROCHLORPERAZINE MALEATE 10 MG PO TABS
ORAL_TABLET | ORAL | Status: AC
  Filled 2019-07-18: qty 1

## 2019-07-18 MED ORDER — SODIUM CHLORIDE 0.9% FLUSH
10.0000 mL | INTRAVENOUS | Status: DC | PRN
  Administered 2019-07-18: 10 mL
  Filled 2019-07-18: qty 10

## 2019-07-18 MED ORDER — PROCHLORPERAZINE MALEATE 10 MG PO TABS: 10.0000 mg | ORAL_TABLET | Freq: Once | ORAL | Status: DC

## 2019-07-18 MED ORDER — SODIUM CHLORIDE 0.9 % IV SOLN
Freq: Once | INTRAVENOUS | Status: AC
  Administered 2019-07-18: 13:00:00 via INTRAVENOUS
  Filled 2019-07-18: qty 250

## 2019-07-18 MED ORDER — SODIUM CHLORIDE 0.9 % IV SOLN
475.0000 mg/m2 | Freq: Once | INTRAVENOUS | Status: AC
  Administered 2019-07-18: 1000 mg via INTRAVENOUS
  Filled 2019-07-18: qty 40

## 2019-07-18 NOTE — Patient Instructions (Signed)
[facility] Discharge Instructions for Patients Receiving Chemotherapy  Today you received the following chemotherapy agents Keytruda, Alimta  To help prevent nausea and vomiting after your treatment, we encourage you to take your nausea medication.   If you develop nausea and vomiting that is not controlled by your nausea medication, call the clinic.   BELOW ARE SYMPTOMS THAT SHOULD BE REPORTED IMMEDIATELY:  *FEVER GREATER THAN 100.5 F  *CHILLS WITH OR WITHOUT FEVER  NAUSEA AND VOMITING THAT IS NOT CONTROLLED WITH YOUR NAUSEA MEDICATION  *UNUSUAL SHORTNESS OF BREATH  *UNUSUAL BRUISING OR BLEEDING  TENDERNESS IN MOUTH AND THROAT WITH OR WITHOUT PRESENCE OF ULCERS  *URINARY PROBLEMS  *BOWEL PROBLEMS  UNUSUAL RASH Items with * indicate a potential emergency and should be followed up as soon as possible.  Feel free to call the clinic should you have any questions or concerns. The clinic phone number is [phone].  Please show the [facility] at check-in to the Emergency Department and triage nurse.

## 2019-07-18 NOTE — Patient Instructions (Signed)

## 2019-07-18 NOTE — Progress Notes (Signed)
[facility] Telephone:([phone]   [phone]  OFFICE PROGRESS NOTE  Laurey Morale, MD [place] [address]  DIAGNOSIS: Stage IV (T2b, N2, M1c) presented with right middle lobe lung mass in addition to mediastinal lymphadenopathy as well as malignant right pleural effusion with pleural-based nodules and suspicious right hepatic lesion and the brain metastasis diagnosed in July 2020.  Molecular Biomarkers: [id number] (Exon 20 insertion) 0.2% Dacomitinib,Neratinib,Osimertinib  [id number] 0.1% None    PRIOR THERAPY: None  CURRENT THERAPY: Systemic chemotherapy with carboplatin for AUC of 5, Alimta 500 mg/M2 and Keytruda 200 mg IV every 3 weeks.  First dose April 04, 2019.  Status post 5 cycles .  Starting from cycle #5 the patient is on maintenance treatment with Alimta and Keytruda every 3 weeks.  INTERVAL HISTORY: Ethan Brown 68 y.o. male returns to the clinic today for follow-up visit.  The patient is feeling fine today with no concerning complaints except for very mild fatigue.  He denied having any current chest pain, shortness of breath, cough or hemoptysis.  He denied having any fever or chills.  He has no nausea, vomiting, diarrhea or constipation.  He is tolerating the maintenance therapy with Alimta and Keytruda fairly well.  He is here today for evaluation before starting cycle #6.  MEDICAL HISTORY: Past Medical History:  Diagnosis Date  . Borderline hypertension   . Borderline systolic hypertension   . Brain metastasis ([place]) dx'd 01/2019  . Detached vitreous humor, left   . Dyspnea   . ED (erectile dysfunction) of organic origin   . Hyperlipidemia   . Hypertension   . Lens subluxation, left   . Lung cancer ([place]) dx'd 01/2019  . Migraine   . Migraine headache    takes PRN Imitrex  . Pseudophakia   . TGA (transient global amnesia) 2017  . Transient global amnesia     ALLERGIES:  has No Known  Allergies.  MEDICATIONS:  Current Outpatient Medications  Medication Sig Dispense Refill  . benzonatate (TESSALON) 200 MG capsule Take 1 capsule (200 mg total) by mouth 3 (three) times daily as needed for cough. (Patient not taking: Reported on 07/12/2019) 30 capsule 0  . cyanocobalamin (,VITAMIN B-12,) 1000 MCG/ML injection INJECT 1 ML (1,000 MCG TOTAL) INTO THE MUSCLE ONCE FOR 1 DOSE. 5 mL 1  . folic acid (FOLVITE) 1 MG tablet TAKE 1 TABLET BY MOUTH EVERY DAY 90 tablet 1  . lidocaine-prilocaine (EMLA) cream Apply to the Port-A-Cath site 30-60 minutes before treatment 30 g 0  . prochlorperazine (COMPAZINE) 10 MG tablet Take 1 tablet (10 mg total) by mouth every 6 (six) hours as needed for nausea or vomiting. 30 tablet 0  . SUMAtriptan (IMITREX) 50 MG tablet Take 50 mg by mouth every 2 (two) hours as needed for migraine.     . traMADol (ULTRAM) 50 MG tablet Take 1 tablet (50 mg total) by mouth every 6 (six) hours as needed for severe pain. (Patient not taking: Reported on 07/12/2019) 20 tablet 0   No current facility-administered medications for this visit.     SURGICAL HISTORY:  Past Surgical History:  Procedure Laterality Date  . CAROTID DOPPLERS  09/2017   Mild non-obstructive disease  . CATARACT EXTRACTION, BILATERAL    . cataract, left  2018  . CHEST TUBE INSERTION Right 03/29/2019   Procedure: INSERTION PLEURAL DRAINAGE CATHETER;  Surgeon: [name], MD;  Location: [facility];  Service: Thoracic;  Laterality: Right;  . COLONOSCOPY  2016   clear, repeat in 10 yrs   . Eye surgeries     , Lens attachment.  Vitrectomy  . FEMORAL HERNIA REPAIR    . HERNIA REPAIR    . IR THORACENTESIS ASP PLEURAL SPACE W/IMG GUIDE  02/22/2019  . IR THORACENTESIS ASP PLEURAL SPACE W/IMG GUIDE  03/13/2019  . LUMBAR LAMINECTOMY    . PLEURAL BIOPSY Right 03/29/2019   Procedure: PLEURAL BIOPSY;  Surgeon: [name], MD;  Location: [facility];  Service: Thoracic;  Laterality: Right;  . PORTACATH  PLACEMENT N/A 03/29/2019   Procedure: INSERTION PORT-A-CATH;  Surgeon: [name], MD;  Location: [facility];  Service: Thoracic;  Laterality: N/A;  . retinal attachment, right    . sclearl buckle, right    . victrectomy,right    . VIDEO ASSISTED THORACOSCOPY Right 03/29/2019   Procedure: VIDEO ASSISTED THORACOSCOPY;  Surgeon: [name], MD;  Location: [facility] OR;  Service: Thoracic;  Laterality: Right;    REVIEW OF SYSTEMS:  A comprehensive review of systems was negative except for: Constitutional: positive for fatigue   PHYSICAL EXAMINATION: General appearance: alert, cooperative, fatigued and no distress Head: Normocephalic, without obvious abnormality, atraumatic Neck: no adenopathy, no JVD, supple, symmetrical, trachea midline and thyroid not enlarged, symmetric, no tenderness/mass/nodules Lymph nodes: Cervical, supraclavicular, and axillary nodes normal. Resp: clear to auscultation bilaterally Back: symmetric, no curvature. ROM normal. No CVA tenderness. Cardio: regular rate and rhythm, S1, S2 normal, no murmur, click, rub or gallop GI: soft, non-tender; bowel sounds normal; no masses,  no organomegaly Extremities: extremities normal, atraumatic, no cyanosis or edema  ECOG PERFORMANCE STATUS: 1 - Symptomatic but completely ambulatory  Blood pressure 136/84, pulse 76, temperature 98.3 F (36.8 C), temperature source Temporal, resp. rate 18, height '5\' 11"'  (1.803 m), weight 196 lb 11.2 oz (89.2 kg), SpO2 100 %.  LABORATORY DATA: Lab Results  Component Value Date   WBC 4.5 07/18/2019   HGB 11.5 (L) 07/18/2019   HCT 35.1 (L) 07/18/2019   MCV 99.7 07/18/2019   PLT 302 07/18/2019      Chemistry      Component Value Date/Time   NA 140 06/27/2019 1049   K 4.2 06/27/2019 1049   CL 106 06/27/2019 1049   CO2 25 06/27/2019 1049   BUN 18 06/27/2019 1049   CREATININE 1.04 06/27/2019 1049      Component Value Date/Time   CALCIUM 8.9 06/27/2019 1049   ALKPHOS 92  06/27/2019 1049   AST 24 06/27/2019 1049   ALT 22 06/27/2019 1049   BILITOT 0.3 06/27/2019 1049       RADIOGRAPHIC STUDIES: Dg Chest 2 View  Result Date: 07/11/2019 CLINICAL DATA:  Lung cancer.  Follow-up pleural effusion. EXAM: CHEST - 2 VIEW COMPARISON:  06/20/2019 FINDINGS: Small right pleural effusion unchanged. PleurX catheter on the right unchanged. No pneumothorax. Right lower lobe airspace disease unchanged. Port-A-Cath tip SVC. Left lung remains clear. Heart size and vascularity normal. IMPRESSION: No interval change in small right effusion and PleurX catheter. Right lower lobe airspace disease unchanged. Electronically Signed   By: Franchot Gallo M.D.   On: 07/11/2019 10:29   Dg Chest 2 View  Result Date: 06/20/2019 CLINICAL DATA:  Lung cancer. EXAM: CHEST - 2 VIEW COMPARISON:  CT 06/05/2019. FINDINGS: PowerPort noted with tip over superior vena cava. Right pleural catheter drainage catheter again noted in stable position heart size normal. Right base atelectasis/infiltrate with small right pleural effusion. No pneumothorax.  IMPRESSION: 1. PowerPort and right pleural drainage catheter in stable position. No pneumothorax. 2. Persistent mild right base atelectasis/infiltrate and small right pleural effusion. Electronically Signed   By: Marcello Moores  Register   On: 06/20/2019 12:54   Mr Brain W Wo Contrast  Result Date: 07/06/2019 CLINICAL DATA:  Metastatic lung cancer post SRS, follow-up EXAM: MRI HEAD WITHOUT AND WITH CONTRAST TECHNIQUE: Multiplanar, multiecho pulse sequences of the brain and surrounding structures were obtained without and with intravenous contrast. CONTRAST:  68m MULTIHANCE GADOBENATE DIMEGLUMINE 529 MG/ML IV SOLN COMPARISON:  March 31, 2019 FINDINGS: Brain: Decrease in size of right postcentral gyrus lesion now measuring 3 mm (previously 6 mm). Adjacent T2 FLAIR hyperintensity has resolved. As before, there is associated susceptibility likely reflecting chronic blood  products. There is no new mass or abnormal enhancement. There is no acute infarction. Two small foci of susceptibility are again identified in the left parietal lobe compatible with chronic microhemorrhages or mineralization. Patchy small foci of T2 hyperintensity in the supratentorial white matter are nonspecific but may reflect minor chronic microvascular ischemic changes. There is no hydrocephalus or extra-axial fluid collection. Vascular: Major vessel flow voids at the skull base are preserved. Skull and upper cervical spine: Normal marrow signal is preserved. Sinuses/Orbits: Minor paranasal sinus mucosal thickening. Orbits are unremarkable. Other: Sella is unremarkable.  Mastoid air cells are clear. IMPRESSION: Decrease in size of treated right parietal lesion. No new metastasis. Electronically Signed   By: PMacy MisM.D.   On: 07/06/2019 11:22    ASSESSMENT AND PLAN: This is a very pleasant 68years old white male recently diagnosed with a stage IV (T2b, N2, M1C) non-small cell lung cancer, adenocarcinoma with positive EGFR mutation in exon 20 (resistant mutation) diagnosed in July 2020 and presented with extensive disease involving the right upper lobe as well as the right middle and lower lobe with extensive pleural based metastasis as well as mediastinal and hilar disease with malignant pleural effusion as well as liver and brain metastasis. Unfortunately the patient has no actionable mutations based on the molecular studies by [employer].   The patient is currently undergoing systemic chemotherapy with carboplatin for AUC of 5, Alimta 500 mg/M2 and Keytruda 200 mg IV every 3 weeks status post 5 cycles. Starting from cycle #5 the patient will be on treatment with maintenance Alimta and Keytruda. He continues to tolerate this treatment well with no concerning adverse effects. I recommended for him to proceed with cycle #6 today as planned. I will see him back for follow-up visit in 3 weeks  for evaluation before the next cycle of his treatment with repeat CT scan of the chest, abdomen pelvis for restaging of his disease. The patient was advised to call immediately if he has any concerning symptoms in the interval. The patient voices understanding of current disease status and treatment options and is in agreement with the current care plan. All questions were answered. The patient knows to call the clinic with any problems, questions or concerns. We can certainly see the patient much sooner if necessary. I spent 10 minutes counseling the patient face to face. The total time spent in the appointment was 15 minutes.  Disclaimer: This note was dictated with voice recognition software. Similar sounding words can inadvertently be transcribed and may not be corrected upon review.

## 2019-08-02 ENCOUNTER — Encounter: Payer: Self-pay | Admitting: *Deleted

## 2019-08-07 ENCOUNTER — Other Ambulatory Visit: Payer: Self-pay

## 2019-08-07 ENCOUNTER — Ambulatory Visit (HOSPITAL_COMMUNITY)
Admission: RE | Admit: 2019-08-07 | Discharge: 2019-08-07 | Disposition: A | Discharge status: Home / Self Care | Payer: Medicare Other | Source: Ambulatory Visit | Attending: Internal Medicine | Admitting: Internal Medicine

## 2019-08-07 DIAGNOSIS — E079 Disorder of thyroid, unspecified: Secondary | ICD-10-CM

## 2019-08-07 DIAGNOSIS — C3490 Malignant neoplasm of unspecified part of unspecified bronchus or lung: Secondary | ICD-10-CM | POA: Diagnosis present

## 2019-08-07 DIAGNOSIS — C3491 Malignant neoplasm of unspecified part of right bronchus or lung: Secondary | ICD-10-CM

## 2019-08-07 MED ORDER — IOHEXOL 300 MG/ML  SOLN
100.0000 mL | Freq: Once | INTRAMUSCULAR | Status: AC | PRN
  Administered 2019-08-07: 09:00:00 100 mL via INTRAVENOUS

## 2019-08-07 MED ORDER — SODIUM CHLORIDE (PF) 0.9 % IJ SOLN
INTRAMUSCULAR | Status: AC
  Filled 2019-08-07: qty 50

## 2019-08-08 ENCOUNTER — Inpatient Hospital Stay: Payer: Medicare Other

## 2019-08-08 ENCOUNTER — Other Ambulatory Visit: Payer: Self-pay

## 2019-08-08 ENCOUNTER — Encounter: Payer: Self-pay | Admitting: Internal Medicine

## 2019-08-08 ENCOUNTER — Encounter: Payer: Self-pay | Admitting: *Deleted

## 2019-08-08 ENCOUNTER — Inpatient Hospital Stay: Payer: Medicare Other | Attending: Internal Medicine | Admitting: Internal Medicine

## 2019-08-08 VITALS — BP 138/82 | HR 67 | Temp 98.3°F | Resp 18 | Ht 71.0 in | Wt 197.9 lb

## 2019-08-08 DIAGNOSIS — Z5112 Encounter for antineoplastic immunotherapy: Secondary | ICD-10-CM

## 2019-08-08 DIAGNOSIS — C61 Malignant neoplasm of prostate: Secondary | ICD-10-CM | POA: Insufficient documentation

## 2019-08-08 DIAGNOSIS — C3491 Malignant neoplasm of unspecified part of right bronchus or lung: Secondary | ICD-10-CM

## 2019-08-08 DIAGNOSIS — Z95828 Presence of other vascular implants and grafts: Secondary | ICD-10-CM

## 2019-08-08 DIAGNOSIS — Z79899 Other long term (current) drug therapy: Secondary | ICD-10-CM | POA: Insufficient documentation

## 2019-08-08 DIAGNOSIS — C7931 Secondary malignant neoplasm of brain: Secondary | ICD-10-CM | POA: Insufficient documentation

## 2019-08-08 DIAGNOSIS — J910 Malignant pleural effusion: Secondary | ICD-10-CM | POA: Diagnosis not present

## 2019-08-08 DIAGNOSIS — C3411 Malignant neoplasm of upper lobe, right bronchus or lung: Secondary | ICD-10-CM | POA: Diagnosis present

## 2019-08-08 DIAGNOSIS — J984 Other disorders of lung: Secondary | ICD-10-CM | POA: Insufficient documentation

## 2019-08-08 DIAGNOSIS — C787 Secondary malignant neoplasm of liver and intrahepatic bile duct: Secondary | ICD-10-CM | POA: Diagnosis not present

## 2019-08-08 DIAGNOSIS — Z5111 Encounter for antineoplastic chemotherapy: Secondary | ICD-10-CM | POA: Diagnosis present

## 2019-08-08 DIAGNOSIS — E079 Disorder of thyroid, unspecified: Secondary | ICD-10-CM

## 2019-08-08 LAB — CBC WITH DIFFERENTIAL (CANCER CENTER ONLY)
Abs Immature Granulocytes: 0.01 10*3/uL (ref 0.00–0.07)
Basophils Absolute: 0 10*3/uL (ref 0.0–0.1)
Basophils Relative: 0 %
Eosinophils Absolute: 0.1 10*3/uL (ref 0.0–0.5)
Eosinophils Relative: 2 %
HCT: 37.2 % — ABNORMAL LOW (ref 39.0–52.0)
Hemoglobin: 12.3 g/dL — ABNORMAL LOW (ref 13.0–17.0)
Immature Granulocytes: 0 %
Lymphocytes Relative: 16 %
Lymphs Abs: 0.8 10*3/uL (ref 0.7–4.0)
MCH: 32.8 pg (ref 26.0–34.0)
MCHC: 33.1 g/dL (ref 30.0–36.0)
MCV: 99.2 fL (ref 80.0–100.0)
Monocytes Absolute: 0.4 10*3/uL (ref 0.1–1.0)
Monocytes Relative: 9 %
Neutro Abs: 3.5 10*3/uL (ref 1.7–7.7)
Neutrophils Relative %: 73 %
Platelet Count: 235 10*3/uL (ref 150–400)
RBC: 3.75 MIL/uL — ABNORMAL LOW (ref 4.22–5.81)
RDW: 13.5 % (ref 11.5–15.5)
WBC Count: 4.8 10*3/uL (ref 4.0–10.5)
nRBC: 0 % (ref 0.0–0.2)

## 2019-08-08 LAB — CMP (CANCER CENTER ONLY)
ALT: 38 U/L (ref 0–44)
AST: 37 U/L (ref 15–41)
Albumin: 3.5 g/dL (ref 3.5–5.0)
Alkaline Phosphatase: 85 U/L (ref 38–126)
Anion gap: 9 (ref 5–15)
BUN: 15 mg/dL (ref 8–23)
CO2: 26 mmol/L (ref 22–32)
Calcium: 8.9 mg/dL (ref 8.9–10.3)
Chloride: 105 mmol/L (ref 98–111)
Creatinine: 0.99 mg/dL (ref 0.61–1.24)
GFR, Est AFR Am: >60 mL/min (ref >60)
GFR, Estimated: >60 mL/min (ref >60)
Glucose, Bld: 91 mg/dL (ref 70–99)
Potassium: 4.5 mmol/L (ref 3.5–5.1)
Sodium: 140 mmol/L (ref 135–145)
Total Bilirubin: 0.3 mg/dL (ref 0.3–1.2)
Total Protein: 6.8 g/dL (ref 6.5–8.1)

## 2019-08-08 LAB — TSH: TSH: 1.315 u[IU]/mL (ref 0.320–4.118)

## 2019-08-08 MED ORDER — SODIUM CHLORIDE 0.9 % IV SOLN
200.0000 mg | Freq: Once | INTRAVENOUS | Status: AC
  Administered 2019-08-08: 200 mg via INTRAVENOUS
  Filled 2019-08-08: qty 8

## 2019-08-08 MED ORDER — SODIUM CHLORIDE 0.9% FLUSH
10.0000 mL | INTRAVENOUS | Status: DC | PRN
  Administered 2019-08-08: 10 mL
  Filled 2019-08-08: qty 10

## 2019-08-08 MED ORDER — SODIUM CHLORIDE 0.9 % IV SOLN
Freq: Once | INTRAVENOUS | Status: AC
  Filled 2019-08-08: qty 250

## 2019-08-08 MED ORDER — SODIUM CHLORIDE 0.9 % IV SOLN
475.0000 mg/m2 | Freq: Once | INTRAVENOUS | Status: AC
  Administered 2019-08-08: 1000 mg via INTRAVENOUS
  Filled 2019-08-08: qty 40

## 2019-08-08 MED ORDER — HEPARIN SOD (PORK) LOCK FLUSH 100 UNIT/ML IV SOLN
500.0000 [IU] | Freq: Once | INTRAVENOUS | Status: AC | PRN
  Administered 2019-08-08: 500 [IU]
  Filled 2019-08-08: qty 5

## 2019-08-08 MED ORDER — PROCHLORPERAZINE MALEATE 10 MG PO TABS: 10.0000 mg | ORAL_TABLET | Freq: Once | ORAL | Status: DC

## 2019-08-08 NOTE — Progress Notes (Signed)
Oncology Nurse Navigator Documentation  Oncology Nurse Navigator Flowsheets 08/08/2019  Abnormal Finding Date -  Confirmed Diagnosis Date -  Diagnosis Status -  Navigator Follow Up Date: -  Navigator Follow Up Reason: -  Navigation Complete Date: -  Reason Not Navigating Patient: -  Navigator Location [facility]  Referral Date to RadOnc/MedOnc -  Navigator Encounter Type Clinic/MDC/I spoke with patient today with Mr. Morrical at clinic.  He is doing well without complaints.  I offered support and encouragement with his ongoing treatment. Mr. Lassen is a very nice man and I enjoy joking with him.  No barriers identified at this time.   Telephone -  Treatment Initiated Date -  Patient Visit Type -  Treatment Phase Treatment  Barriers/Navigation Needs -  Education -  Interventions Psycho-Social Support  Acuity Level 1-No Barriers  Coordination of Care -  Education Method -  Support Groups/Services -  Time Spent with Patient 15

## 2019-08-08 NOTE — Progress Notes (Signed)
Ethan [phone]   [phone]  OFFICE PROGRESS NOTE  Laurey Morale, MD [place] [address]  DIAGNOSIS: Stage IV (T2b, N2, M1c) presented with right middle lobe lung mass in addition to mediastinal lymphadenopathy as well as malignant right pleural effusion with pleural-based nodules and suspicious right hepatic lesion and the brain metastasis diagnosed in July 2020.  Molecular Biomarkers: [id number] (Exon 20 insertion) 0.2% Dacomitinib,Neratinib,Osimertinib  [id number] 0.1% None    PRIOR THERAPY: None  CURRENT THERAPY: Systemic chemotherapy with carboplatin for AUC of 5, Alimta 500 mg/M2 and Keytruda 200 mg IV every 3 weeks.  First dose April 04, 2019.  Status post 5 cycles  Starting from cycle #5 the patient is on maintenance treatment with Alimta and Keytruda every 3 weeks.  INTERVAL HISTORY: Ethan Brown 68 y.o. male returns to the clinic today for follow-up visit.  The patient is feeling fine today with no concerning complaints.  He has been tolerating his treatment with maintenance Alimta and Keytruda fairly well.  He denied having any current chest pain, shortness of breath, cough or hemoptysis.  He denied having any fever or chills.  He has no nausea, vomiting, diarrhea or constipation.  He has no headache or visual changes.  He had repeat CT scan of the chest, abdomen pelvis performed recently and is here for evaluation and discussion of his discuss results.  MEDICAL HISTORY: Past Medical History:  Diagnosis Date  . Borderline hypertension   . Borderline systolic hypertension   . Brain metastasis ([place]) dx'd 01/2019  . Detached vitreous humor, left   . Dyspnea   . ED (erectile dysfunction) of organic origin   . Hyperlipidemia   . Hypertension   . Lens subluxation, left   . Lung cancer ([place]) dx'd 01/2019  . Migraine   . Migraine headache    takes PRN Imitrex  . Pseudophakia   . TGA  (transient global amnesia) 2017  . Transient global amnesia     ALLERGIES:  has No Known Allergies.  MEDICATIONS:  Current Outpatient Medications  Medication Sig Dispense Refill  . cyanocobalamin (,VITAMIN B-12,) 1000 MCG/ML injection INJECT 1 ML (1,000 MCG TOTAL) INTO THE MUSCLE ONCE FOR 1 DOSE. 5 mL 1  . folic acid (FOLVITE) 1 MG tablet TAKE 1 TABLET BY MOUTH EVERY DAY 90 tablet 1  . lidocaine-prilocaine (EMLA) cream Apply to the Port-A-Cath site 30-60 minutes before treatment 30 g 0  . prochlorperazine (COMPAZINE) 10 MG tablet Take 1 tablet (10 mg total) by mouth every 6 (six) hours as needed for nausea or vomiting. 30 tablet 0  . SUMAtriptan (IMITREX) 50 MG tablet Take 50 mg by mouth every 2 (two) hours as needed for migraine.     . benzonatate (TESSALON) 200 MG capsule Take 1 capsule (200 mg total) by mouth 3 (three) times daily as needed for cough. (Patient not taking: Reported on 07/12/2019) 30 capsule 0  . traMADol (ULTRAM) 50 MG tablet Take 1 tablet (50 mg total) by mouth every 6 (six) hours as needed for severe pain. (Patient not taking: Reported on 07/12/2019) 20 tablet 0   No current facility-administered medications for this visit.    SURGICAL HISTORY:  Past Surgical History:  Procedure Laterality Date  . CAROTID DOPPLERS  09/2017   Mild non-obstructive disease  . CATARACT EXTRACTION, BILATERAL    . cataract, left  2018  . CHEST TUBE INSERTION Right 03/29/2019   Procedure: INSERTION PLEURAL  DRAINAGE CATHETER;  Surgeon: [name], MD;  Location: [facility];  Service: Thoracic;  Laterality: Right;  . COLONOSCOPY  2016   clear, repeat in 10 yrs   . Eye surgeries     , Lens attachment.  Vitrectomy  . FEMORAL HERNIA REPAIR    . HERNIA REPAIR    . IR THORACENTESIS ASP PLEURAL SPACE W/IMG GUIDE  02/22/2019  . IR THORACENTESIS ASP PLEURAL SPACE W/IMG GUIDE  03/13/2019  . LUMBAR LAMINECTOMY    . PLEURAL BIOPSY Right 03/29/2019   Procedure: PLEURAL BIOPSY;  Surgeon:  [name], MD;  Location: [facility];  Service: Thoracic;  Laterality: Right;  . PORTACATH PLACEMENT N/A 03/29/2019   Procedure: INSERTION PORT-A-CATH;  Surgeon: [name], MD;  Location: [facility];  Service: Thoracic;  Laterality: N/A;  . retinal attachment, right    . sclearl buckle, right    . victrectomy,right    . VIDEO ASSISTED THORACOSCOPY Right 03/29/2019   Procedure: VIDEO ASSISTED THORACOSCOPY;  Surgeon: [name], MD;  Location: [facility];  Service: Thoracic;  Laterality: Right;    REVIEW OF SYSTEMS:  Constitutional: negative Eyes: negative Ears, nose, mouth, throat, and face: negative Respiratory: negative Cardiovascular: negative Gastrointestinal: negative Genitourinary:negative Integument/breast: negative Hematologic/lymphatic: negative Musculoskeletal:negative Neurological: negative Behavioral/Psych: negative Endocrine: negative Allergic/Immunologic: negative   PHYSICAL EXAMINATION: General appearance: alert, cooperative, fatigued and no distress Head: Normocephalic, without obvious abnormality, atraumatic Neck: no adenopathy, no JVD, supple, symmetrical, trachea midline and thyroid not enlarged, symmetric, no tenderness/mass/nodules Lymph nodes: Cervical, supraclavicular, and axillary nodes normal. Resp: clear to auscultation bilaterally Back: symmetric, no curvature. ROM normal. No CVA tenderness. Cardio: regular rate and rhythm, S1, S2 normal, no murmur, click, rub or gallop GI: soft, non-tender; bowel sounds normal; no masses,  no organomegaly Extremities: extremities normal, atraumatic, no cyanosis or edema Neurologic: Alert and oriented X 3, normal strength and tone. Normal symmetric reflexes. Normal coordination and gait  ECOG PERFORMANCE STATUS: 1 - Symptomatic but completely ambulatory  Blood pressure 138/82, pulse 67, temperature 98.3 F (36.8 C), temperature source Temporal, resp. rate 18, height 5' 11" (1.803 m), weight 197 lb 14.4  oz (89.8 kg), SpO2 99 %.  LABORATORY DATA: Lab Results  Component Value Date   WBC 4.8 08/08/2019   HGB 12.3 (L) 08/08/2019   HCT 37.2 (L) 08/08/2019   MCV 99.2 08/08/2019   PLT 235 08/08/2019      Chemistry      Component Value Date/Time   NA 140 07/18/2019 1114   K 4.2 07/18/2019 1114   CL 106 07/18/2019 1114   CO2 26 07/18/2019 1114   BUN 16 07/18/2019 1114   CREATININE 0.98 07/18/2019 1114      Component Value Date/Time   CALCIUM 8.8 (L) 07/18/2019 1114   ALKPHOS 92 07/18/2019 1114   AST 28 07/18/2019 1114   ALT 23 07/18/2019 1114   BILITOT 0.3 07/18/2019 1114       RADIOGRAPHIC STUDIES: DG Chest 2 View  Result Date: 07/11/2019 CLINICAL DATA:  Lung cancer.  Follow-up pleural effusion. EXAM: CHEST - 2 VIEW COMPARISON:  06/20/2019 FINDINGS: Small right pleural effusion unchanged. PleurX catheter on the right unchanged. No pneumothorax. Right lower lobe airspace disease unchanged. Port-A-Cath tip SVC. Left lung remains clear. Heart size and vascularity normal. IMPRESSION: No interval change in small right effusion and PleurX catheter. Right lower lobe airspace disease unchanged. Electronically Signed   By: Franchot Gallo M.D.   On: 07/11/2019 10:29   CT Chest  W Contrast  Addendum Date: 08/07/2019   ADDENDUM REPORT: 08/07/2019 11:00 ADDENDUM: Note that the abnormality seen in the right hemi liver on the previous exam along the margin of the right hepatic lobe is no longer visible. Also small retrocaval lymph node referenced on previous exam and on a previous PET evaluation is stable to slightly smaller at approximately 5 mm. Electronically Signed   By: Zetta Bills M.D.   On: 08/07/2019 11:00   Result Date: 08/07/2019 CLINICAL DATA:  Non-small cell lung cancer staging. EXAM: CT CHEST, ABDOMEN, AND PELVIS WITH CONTRAST TECHNIQUE: Multidetector CT imaging of the chest, abdomen and pelvis was performed following the standard protocol during bolus administration of  intravenous contrast. CONTRAST:  131m OMNIPAQUE IOHEXOL 300 MG/ML  SOLN COMPARISON:  08/03/2019 FINDINGS: CT CHEST FINDINGS Cardiovascular: Heart size is stable. Aortic caliber is normal. Right-sided Port-A-Cath terminates in the distal superior vena cava. Mediastinum/Nodes: Right paratracheal/precarinal lymph node (image 27, series 2 13 mm short axis dimension as compared to 16 mm. Mild fullness of right hilar nodal tissue without frank adenopathy or mass. No signs of left hilar adenopathy. No signs of supraclavicular adenopathy. No axillary adenopathy. Lungs/Pleura: Signs of pleural nodularity in the right chest with similar appearance. Right juxta hilar mass seen along the minor fissure, in the right middle lobe (image 79, series 4) 1.7 by select 1.7 x 1.7 x 1.4 cm previously approximately 1.9 by 1.6 cm when measured in a similar fashion. Pleural nodularity is similar to the previous exam. Stable appearance of right-sided pleural effusion with pleural thickening. Anterior right middle lobe nodule measuring 1.3 cm in greatest axial dimension (image 118, series 4) previously 1.5 cm. Musculoskeletal: No sign of chest wall mass or destructive bone process. CT ABDOMEN PELVIS FINDINGS Hepatobiliary: No signs of focal, suspicious hepatic lesion. Biliary tree is normal. Pancreas: Unremarkable. No pancreatic ductal dilatation or surrounding inflammatory changes. Spleen: Normal in size without focal abnormality. Adrenals/Urinary Tract: The adrenal glands are normal. The kidneys enhance symmetrically without focal, suspicious lesion or hydronephrosis. Urinary bladder is unremarkable. Stomach/Bowel: No signs of acute gastrointestinal process appendix seen in right lower quadrant and is normal. Vascular/Lymphatic: No signs of acute vascular process. No signs of upper abdominal or retroperitoneal adenopathy. No signs of pelvic lymphadenopathy. Reproductive: Heterogeneous prostate. No sign abnormality on CT. Other: No  abdominal wall hernia or abnormality. No abdominopelvic ascites. Musculoskeletal: No acute or destructive bone process. IMPRESSION: 1. Signs of pleural nodularity in the right chest with similar appearance to the previous exam. 2. Slight interval decrease in size of dominant right lung nodules and mediastinal adenopathy. 3. Stable right-sided pleural effusion with pleural thickening. 4. No evidence for metastatic disease within the abdomen or pelvis. 5. Stable heterogeneous prostate gland. 6. Aortic atherosclerosis. Aortic Atherosclerosis (ICD10-I70.0). Electronically Signed: By: GZetta BillsM.D. On: 08/07/2019 09:43   CT Abdomen Pelvis W Contrast  Addendum Date: 08/07/2019   ADDENDUM REPORT: 08/07/2019 11:00 ADDENDUM: Note that the abnormality seen in the right hemi liver on the previous exam along the margin of the right hepatic lobe is no longer visible. Also small retrocaval lymph node referenced on previous exam and on a previous PET evaluation is stable to slightly smaller at approximately 5 mm. Electronically Signed   By: GZetta BillsM.D.   On: 08/07/2019 11:00   Result Date: 08/07/2019 CLINICAL DATA:  Non-small cell lung cancer staging. EXAM: CT CHEST, ABDOMEN, AND PELVIS WITH CONTRAST TECHNIQUE: Multidetector CT imaging of the chest, abdomen and pelvis was performed  following the standard protocol during bolus administration of intravenous contrast. CONTRAST:  126m OMNIPAQUE IOHEXOL 300 MG/ML  SOLN COMPARISON:  08/03/2019 FINDINGS: CT CHEST FINDINGS Cardiovascular: Heart size is stable. Aortic caliber is normal. Right-sided Port-A-Cath terminates in the distal superior vena cava. Mediastinum/Nodes: Right paratracheal/precarinal lymph node (image 27, series 2 13 mm short axis dimension as compared to 16 mm. Mild fullness of right hilar nodal tissue without frank adenopathy or mass. No signs of left hilar adenopathy. No signs of supraclavicular adenopathy. No axillary adenopathy. Lungs/Pleura:  Signs of pleural nodularity in the right chest with similar appearance. Right juxta hilar mass seen along the minor fissure, in the right middle lobe (image 79, series 4) 1.7 by select 1.7 x 1.7 x 1.4 cm previously approximately 1.9 by 1.6 cm when measured in a similar fashion. Pleural nodularity is similar to the previous exam. Stable appearance of right-sided pleural effusion with pleural thickening. Anterior right middle lobe nodule measuring 1.3 cm in greatest axial dimension (image 118, series 4) previously 1.5 cm. Musculoskeletal: No sign of chest wall mass or destructive bone process. CT ABDOMEN PELVIS FINDINGS Hepatobiliary: No signs of focal, suspicious hepatic lesion. Biliary tree is normal. Pancreas: Unremarkable. No pancreatic ductal dilatation or surrounding inflammatory changes. Spleen: Normal in size without focal abnormality. Adrenals/Urinary Tract: The adrenal glands are normal. The kidneys enhance symmetrically without focal, suspicious lesion or hydronephrosis. Urinary bladder is unremarkable. Stomach/Bowel: No signs of acute gastrointestinal process appendix seen in right lower quadrant and is normal. Vascular/Lymphatic: No signs of acute vascular process. No signs of upper abdominal or retroperitoneal adenopathy. No signs of pelvic lymphadenopathy. Reproductive: Heterogeneous prostate. No sign abnormality on CT. Other: No abdominal wall hernia or abnormality. No abdominopelvic ascites. Musculoskeletal: No acute or destructive bone process. IMPRESSION: 1. Signs of pleural nodularity in the right chest with similar appearance to the previous exam. 2. Slight interval decrease in size of dominant right lung nodules and mediastinal adenopathy. 3. Stable right-sided pleural effusion with pleural thickening. 4. No evidence for metastatic disease within the abdomen or pelvis. 5. Stable heterogeneous prostate gland. 6. Aortic atherosclerosis. Aortic Atherosclerosis (ICD10-I70.0). Electronically Signed:  By: GZetta BillsM.D. On: 08/07/2019 09:43    ASSESSMENT AND PLAN: This is a very pleasant 68years old white male recently diagnosed with a stage IV (T2b, N2, M1C) non-small cell lung cancer, adenocarcinoma with positive EGFR mutation in exon 20 (resistant mutation) diagnosed in July 2020 and presented with extensive disease involving the right upper lobe as well as the right middle and lower lobe with extensive pleural based metastasis as well as mediastinal and hilar disease with malignant pleural effusion as well as liver and brain metastasis. Unfortunately the patient has no actionable mutations based on the molecular studies by [employer].   The patient is currently undergoing systemic chemotherapy with carboplatin for AUC of 5, Alimta 500 mg/M2 and Keytruda 200 mg IV every 3 weeks status post 6 cycles. Starting from cycle #5 the patient will be on treatment with maintenance Alimta and Keytruda. The patient has been tolerating this treatment well with no concerning adverse effects. He had repeat CT scan of the chest, abdomen pelvis performed recently.  I personally and independently reviewed the scans and discussed the results with the patient today. Has a scan showed no concerning findings for disease progression. I recommended for him to continue his current treatment with maintenance Alimta and Keytruda. I will see him back for follow-up visit in 3 weeks for evaluation before the  next cycle of his treatment. For the prostate cancer, the patient will continue his routine follow-up visit by his urologist. He was advised to call immediately if he has any concerning symptoms in the interval. The patient voices understanding of current disease status and treatment options and is in agreement with the current care plan. All questions were answered. The patient knows to call the clinic with any problems, questions or concerns. We can certainly see the patient much sooner if necessary.   Disclaimer: This note was dictated with voice recognition software. Similar sounding words can inadvertently be transcribed and may not be corrected upon review.

## 2019-08-08 NOTE — Patient Instructions (Signed)
[facility] Discharge Instructions for Patients Receiving Chemotherapy  Today you received the following chemotherapy agents Keytruda, Alimta  To help prevent nausea and vomiting after your treatment, we encourage you to take your nausea medication.   If you develop nausea and vomiting that is not controlled by your nausea medication, call the clinic.   BELOW ARE SYMPTOMS THAT SHOULD BE REPORTED IMMEDIATELY:  *FEVER GREATER THAN 100.5 F  *CHILLS WITH OR WITHOUT FEVER  NAUSEA AND VOMITING THAT IS NOT CONTROLLED WITH YOUR NAUSEA MEDICATION  *UNUSUAL SHORTNESS OF BREATH  *UNUSUAL BRUISING OR BLEEDING  TENDERNESS IN MOUTH AND THROAT WITH OR WITHOUT PRESENCE OF ULCERS  *URINARY PROBLEMS  *BOWEL PROBLEMS  UNUSUAL RASH Items with * indicate a potential emergency and should be followed up as soon as possible.  Feel free to call the clinic should you have any questions or concerns. The clinic phone number is [phone].  Please show the [place] at check-in to the Emergency Department and triage nurse.

## 2019-08-09 ENCOUNTER — Other Ambulatory Visit: Payer: Self-pay | Admitting: Thoracic Surgery (Cardiothoracic Vascular Surgery)

## 2019-08-09 ENCOUNTER — Telehealth: Payer: Self-pay | Admitting: Internal Medicine

## 2019-08-09 DIAGNOSIS — C3491 Malignant neoplasm of unspecified part of right bronchus or lung: Secondary | ICD-10-CM

## 2019-08-09 DIAGNOSIS — J90 Pleural effusion, not elsewhere classified: Secondary | ICD-10-CM

## 2019-08-09 NOTE — Telephone Encounter (Signed)
Scheduled per los. Called and left msg. Mailed printout  °

## 2019-08-12 ENCOUNTER — Other Ambulatory Visit: Payer: Self-pay | Admitting: Family Medicine

## 2019-08-15 ENCOUNTER — Ambulatory Visit: Payer: Medicare Other | Admitting: Thoracic Surgery (Cardiothoracic Vascular Surgery)

## 2019-08-15 ENCOUNTER — Other Ambulatory Visit: Payer: Self-pay | Admitting: Family Medicine

## 2019-08-15 NOTE — Telephone Encounter (Signed)
Please advise Rx is not on current med list.

## 2019-08-16 ENCOUNTER — Ambulatory Visit
Admission: RE | Admit: 2019-08-16 | Discharge: 2019-08-16 | Disposition: A | Discharge status: Home / Self Care | Payer: Medicare Other | Source: Ambulatory Visit | Attending: Thoracic Surgery (Cardiothoracic Vascular Surgery) | Admitting: Thoracic Surgery (Cardiothoracic Vascular Surgery)

## 2019-08-16 ENCOUNTER — Ambulatory Visit (INDEPENDENT_AMBULATORY_CARE_PROVIDER_SITE_OTHER): Payer: Medicare Other | Admitting: Thoracic Surgery (Cardiothoracic Vascular Surgery)

## 2019-08-16 ENCOUNTER — Other Ambulatory Visit: Payer: Self-pay

## 2019-08-16 VITALS — BP 161/79 | HR 77 | Temp 97.9°F | Resp 20 | Wt 198.0 lb

## 2019-08-16 DIAGNOSIS — C3491 Malignant neoplasm of unspecified part of right bronchus or lung: Secondary | ICD-10-CM

## 2019-08-16 DIAGNOSIS — C7801 Secondary malignant neoplasm of right lung: Secondary | ICD-10-CM

## 2019-08-16 DIAGNOSIS — J910 Malignant pleural effusion: Secondary | ICD-10-CM | POA: Diagnosis not present

## 2019-08-16 NOTE — Progress Notes (Signed)
[address]       [place] [address]             [phone]     HPI: [name] returns for a scheduled follow-up visit  Ethan Brown is a 68 year old gentleman diagnosed earlier this year with stage IV lung cancer with a malignant right pleural effusion.  I did a pleural biopsy and placement of a pleural catheter in August 2020.  He currently is being treated with Alimta and Keytruda.  I removed his pleural catheter's office visit and November.  He now returns for 1 month follow-up.  He feels well.  He is tolerating his treatment well.  He is not having any shortness of breath.  He is walking several miles a day.  He does not have any residual pain at the insertion site.  Past Medical History:  Diagnosis Date  . Borderline hypertension   . Borderline systolic hypertension   . Brain metastasis ([place]) dx'd 01/2019  . Detached vitreous humor, left   . Dyspnea   . ED (erectile dysfunction) of organic origin   . Hyperlipidemia   . Hypertension   . Lens subluxation, left   . Lung cancer ([place]) dx'd 01/2019  . Migraine   . Migraine headache    takes PRN Imitrex  . Pseudophakia   . TGA (transient global amnesia) 2017  . Transient global amnesia     Current Outpatient Medications  Medication Sig Dispense Refill  . benzonatate (TESSALON) 200 MG capsule Take 1 capsule (200 mg total) by mouth 3 (three) times daily as needed for cough. 30 capsule 0  . cyanocobalamin (,VITAMIN B-12,) 1000 MCG/ML injection INJECT 1 ML (1,000 MCG TOTAL) INTO THE MUSCLE ONCE FOR 1 DOSE. 5 mL 1  . folic acid (FOLVITE) 1 MG tablet TAKE 1 TABLET BY MOUTH EVERY DAY 90 tablet 1  . lidocaine-prilocaine (EMLA) cream Apply to the Port-A-Cath site 30-60 minutes before treatment 30 g 0  . prochlorperazine (COMPAZINE) 10 MG tablet Take 1 tablet (10 mg total) by mouth every 6 (six) hours as needed for nausea or vomiting. 30 tablet 0  . sildenafil (REVATIO) 20 MG tablet TAKE AS DIRECTED 10 tablet  11  . SUMAtriptan (IMITREX) 50 MG tablet Take 50 mg by mouth every 2 (two) hours as needed for migraine.     . traMADol (ULTRAM) 50 MG tablet Take 1 tablet (50 mg total) by mouth every 6 (six) hours as needed for severe pain. 20 tablet 0   No current facility-administered medications for this visit.    Physical Exam BP (!) 161/79 (BP Location: Right Arm)   Pulse 77   Temp 97.9 F (36.6 C) (Skin)   Resp 20   Wt 198 lb (89.8 kg)   SpO2 96% Comment: RA  BMI 27.44 kg/m  68 year old man in no acute distress Alert and oriented x3 with no focal deficits Lungs slightly diminished at right base but otherwise clear Port incision and site well-healed  Diagnostic Tests: CHEST - 2 VIEW  COMPARISON:  07/11/2019.  FINDINGS: Interval removal of right PleurX catheter. Ethan Brown catheter in stable position. Mediastinum and hilar structures normal. Heart size normal. Mild right base infiltrate and right-sided pleural effusion unchanged. No pneumothorax no acute bony abnormality.  IMPRESSION: 1. Interval removal of right PleurX catheter. Port-A-Cath in stable position.  2. Stable mild right base infiltrate and right-sided pleural effusion.   Electronically Signed   By: Marcello Moores  Register   On:  08/16/2019 15:14 I personally reviewed the chest x-ray images and concur with the findings noted above  Impression: Ethan Brown is a 68 year old man with stage IV lung cancer had a malignant right pleural effusion.  I placed a pleural catheter back in August.  He had high outputs initially but it tapered off rapidly.  We remove the catheter about a month ago.  He has had no recurrence of his effusion.  Symptomatically he is doing well.  He is tolerating his treatments very well also.  Plan: Follow-up as scheduled with Dr. Julien Nordmann I will be happy to see Mr. Lassen back anytime in the future if I can be of any further assistance with his care  [name], MD [facility] [phone]

## 2019-08-23 ENCOUNTER — Other Ambulatory Visit: Payer: Self-pay | Admitting: *Deleted

## 2019-08-23 ENCOUNTER — Encounter: Payer: Self-pay | Admitting: *Deleted

## 2019-08-23 DIAGNOSIS — C7931 Secondary malignant neoplasm of brain: Secondary | ICD-10-CM

## 2019-08-28 ENCOUNTER — Encounter: Payer: Self-pay | Admitting: *Deleted

## 2019-08-28 ENCOUNTER — Other Ambulatory Visit: Payer: Self-pay | Admitting: Internal Medicine

## 2019-08-29 ENCOUNTER — Encounter: Payer: Self-pay | Admitting: Internal Medicine

## 2019-08-29 ENCOUNTER — Inpatient Hospital Stay: Payer: Medicare Other | Attending: Internal Medicine | Admitting: Internal Medicine

## 2019-08-29 ENCOUNTER — Other Ambulatory Visit: Payer: Self-pay

## 2019-08-29 ENCOUNTER — Inpatient Hospital Stay: Payer: Medicare Other

## 2019-08-29 VITALS — BP 128/79 | HR 73 | Temp 97.1°F | Resp 18 | Ht 71.0 in | Wt 199.8 lb

## 2019-08-29 DIAGNOSIS — Z5112 Encounter for antineoplastic immunotherapy: Secondary | ICD-10-CM

## 2019-08-29 DIAGNOSIS — C61 Malignant neoplasm of prostate: Secondary | ICD-10-CM | POA: Diagnosis not present

## 2019-08-29 DIAGNOSIS — C3491 Malignant neoplasm of unspecified part of right bronchus or lung: Secondary | ICD-10-CM | POA: Diagnosis not present

## 2019-08-29 DIAGNOSIS — E079 Disorder of thyroid, unspecified: Secondary | ICD-10-CM

## 2019-08-29 DIAGNOSIS — Z79899 Other long term (current) drug therapy: Secondary | ICD-10-CM | POA: Diagnosis not present

## 2019-08-29 DIAGNOSIS — C7931 Secondary malignant neoplasm of brain: Secondary | ICD-10-CM | POA: Diagnosis not present

## 2019-08-29 DIAGNOSIS — C3411 Malignant neoplasm of upper lobe, right bronchus or lung: Secondary | ICD-10-CM | POA: Insufficient documentation

## 2019-08-29 DIAGNOSIS — J984 Other disorders of lung: Secondary | ICD-10-CM | POA: Diagnosis not present

## 2019-08-29 DIAGNOSIS — J910 Malignant pleural effusion: Secondary | ICD-10-CM | POA: Diagnosis not present

## 2019-08-29 DIAGNOSIS — C787 Secondary malignant neoplasm of liver and intrahepatic bile duct: Secondary | ICD-10-CM | POA: Insufficient documentation

## 2019-08-29 DIAGNOSIS — Z95828 Presence of other vascular implants and grafts: Secondary | ICD-10-CM

## 2019-08-29 DIAGNOSIS — Z5111 Encounter for antineoplastic chemotherapy: Secondary | ICD-10-CM | POA: Diagnosis not present

## 2019-08-29 LAB — CBC WITH DIFFERENTIAL (CANCER CENTER ONLY)
Abs Immature Granulocytes: 0.02 10*3/uL (ref 0.00–0.07)
Basophils Absolute: 0 10*3/uL (ref 0.0–0.1)
Basophils Relative: 1 %
Eosinophils Absolute: 0.1 10*3/uL (ref 0.0–0.5)
Eosinophils Relative: 1 %
HCT: 38.4 % — ABNORMAL LOW (ref 39.0–52.0)
Hemoglobin: 12.9 g/dL — ABNORMAL LOW (ref 13.0–17.0)
Immature Granulocytes: 0 %
Lymphocytes Relative: 16 %
Lymphs Abs: 0.8 10*3/uL (ref 0.7–4.0)
MCH: 33.3 pg (ref 26.0–34.0)
MCHC: 33.6 g/dL (ref 30.0–36.0)
MCV: 99.2 fL (ref 80.0–100.0)
Monocytes Absolute: 0.4 10*3/uL (ref 0.1–1.0)
Monocytes Relative: 7 %
Neutro Abs: 4 10*3/uL (ref 1.7–7.7)
Neutrophils Relative %: 75 %
Platelet Count: 232 10*3/uL (ref 150–400)
RBC: 3.87 MIL/uL — ABNORMAL LOW (ref 4.22–5.81)
RDW: 13.1 % (ref 11.5–15.5)
WBC Count: 5.3 10*3/uL (ref 4.0–10.5)
nRBC: 0 % (ref 0.0–0.2)

## 2019-08-29 LAB — CMP (CANCER CENTER ONLY)
ALT: 28 U/L (ref 0–44)
AST: 21 U/L (ref 15–41)
Albumin: 3.1 g/dL — ABNORMAL LOW (ref 3.5–5.0)
Alkaline Phosphatase: 65 U/L (ref 38–126)
Anion gap: 6 (ref 5–15)
BUN: 28 mg/dL — ABNORMAL HIGH (ref 8–23)
CO2: 26 mmol/L (ref 22–32)
Calcium: 9.2 mg/dL (ref 8.9–10.3)
Chloride: 109 mmol/L (ref 98–111)
Creatinine: 0.91 mg/dL (ref 0.61–1.24)
GFR, Est AFR Am: >60 mL/min (ref >60)
GFR, Estimated: >60 mL/min (ref >60)
Glucose, Bld: 102 mg/dL — ABNORMAL HIGH (ref 70–99)
Potassium: 4.1 mmol/L (ref 3.5–5.1)
Sodium: 141 mmol/L (ref 135–145)
Total Bilirubin: 0.5 mg/dL (ref 0.3–1.2)
Total Protein: 6.5 g/dL (ref 6.5–8.1)

## 2019-08-29 LAB — TSH: TSH: 1.052 u[IU]/mL (ref 0.320–4.118)

## 2019-08-29 MED ORDER — SODIUM CHLORIDE 0.9% FLUSH
10.0000 mL | INTRAVENOUS | Status: DC | PRN
  Administered 2019-08-29: 10 mL
  Filled 2019-08-29: qty 10

## 2019-08-29 MED ORDER — SODIUM CHLORIDE 0.9 % IV SOLN
200.0000 mg | Freq: Once | INTRAVENOUS | Status: AC
  Administered 2019-08-29: 200 mg via INTRAVENOUS
  Filled 2019-08-29: qty 8

## 2019-08-29 MED ORDER — HEPARIN SOD (PORK) LOCK FLUSH 100 UNIT/ML IV SOLN
500.0000 [IU] | Freq: Once | INTRAVENOUS | Status: AC | PRN
  Administered 2019-08-29: 500 [IU]
  Filled 2019-08-29: qty 5

## 2019-08-29 MED ORDER — SODIUM CHLORIDE 0.9 % IV SOLN
Freq: Once | INTRAVENOUS | Status: DC
  Filled 2019-08-29: qty 250

## 2019-08-29 MED ORDER — SODIUM CHLORIDE 0.9 % IV SOLN
Freq: Once | INTRAVENOUS | Status: AC
  Filled 2019-08-29: qty 250

## 2019-08-29 MED ORDER — PROCHLORPERAZINE MALEATE 10 MG PO TABS: 10.0000 mg | ORAL_TABLET | Freq: Once | ORAL | Status: DC

## 2019-08-29 MED ORDER — SODIUM CHLORIDE 0.9 % IV SOLN
475.0000 mg/m2 | Freq: Once | INTRAVENOUS | Status: AC
  Administered 2019-08-29: 1000 mg via INTRAVENOUS
  Filled 2019-08-29: qty 40

## 2019-08-29 MED ORDER — PROCHLORPERAZINE MALEATE 10 MG PO TABS: 10.0000 mg | ORAL_TABLET | Freq: Four times a day (QID) | ORAL | 0 refills | Status: DC | PRN

## 2019-08-29 NOTE — Patient Instructions (Signed)
[facility] Discharge Instructions for Patients Receiving Chemotherapy  Today you received the following chemotherapy agents Keytruda, Alimta  To help prevent nausea and vomiting after your treatment, we encourage you to take your nausea medication.   If you develop nausea and vomiting that is not controlled by your nausea medication, call the clinic.   BELOW ARE SYMPTOMS THAT SHOULD BE REPORTED IMMEDIATELY:  *FEVER GREATER THAN 100.5 F  *CHILLS WITH OR WITHOUT FEVER  NAUSEA AND VOMITING THAT IS NOT CONTROLLED WITH YOUR NAUSEA MEDICATION  *UNUSUAL SHORTNESS OF BREATH  *UNUSUAL BRUISING OR BLEEDING  TENDERNESS IN MOUTH AND THROAT WITH OR WITHOUT PRESENCE OF ULCERS  *URINARY PROBLEMS  *BOWEL PROBLEMS  UNUSUAL RASH Items with * indicate a potential emergency and should be followed up as soon as possible.  Feel free to call the clinic should you have any questions or concerns. The clinic phone number is [phone].  Please show the [place] at check-in to the Emergency Department and triage nurse.

## 2019-08-29 NOTE — Progress Notes (Signed)
[place] [phone]   [phone]  OFFICE PROGRESS NOTE  Laurey Morale, MD [place] [address]  DIAGNOSIS: Stage IV (T2b, N2, M1c) presented with right middle lobe lung mass in addition to mediastinal lymphadenopathy as well as malignant right pleural effusion with pleural-based nodules and suspicious right hepatic lesion and the brain metastasis diagnosed in July 2020.  Molecular Biomarkers: [id number] (Exon 20 insertion) 0.2% Dacomitinib,Neratinib,Osimertinib  [id number] 0.1% None    PRIOR THERAPY: None  CURRENT THERAPY: Systemic chemotherapy with carboplatin for AUC of 5, Alimta 500 mg/M2 and Keytruda 200 mg IV every 3 weeks.  First dose April 04, 2019.  Status post 7 cycles  Starting from cycle #5 the patient is on maintenance treatment with Alimta and Keytruda every 3 weeks.  INTERVAL HISTORY: Kaedin Hicklin 69 y.o. male returns to the clinic today for follow-up visit.  The patient is feeling fine today with no concerning complaints.  He denied having any current chest pain, shortness of breath, cough or hemoptysis.  He denied having any fever or chills.  He has no nausea, vomiting, diarrhea or constipation.  He has no headache or visual changes.  He continues to tolerate his maintenance treatment with Alimta and Keytruda fairly well.  He is here today for evaluation before starting cycle #8.   MEDICAL HISTORY: Past Medical History:  Diagnosis Date  . Borderline hypertension   . Borderline systolic hypertension   . Brain metastasis ([facility]) dx'd 01/2019  . Detached vitreous humor, left   . Dyspnea   . ED (erectile dysfunction) of organic origin   . Hyperlipidemia   . Hypertension   . Lens subluxation, left   . Lung cancer ([facility]) dx'd 01/2019  . Migraine   . Migraine headache    takes PRN Imitrex  . Pseudophakia   . TGA (transient global amnesia) 2017  . Transient global amnesia     ALLERGIES:   has No Known Allergies.  MEDICATIONS:  Current Outpatient Medications  Medication Sig Dispense Refill  . benzonatate (TESSALON) 200 MG capsule Take 1 capsule (200 mg total) by mouth 3 (three) times daily as needed for cough. 30 capsule 0  . cyanocobalamin (,VITAMIN B-12,) 1000 MCG/ML injection INJECT 1 ML (1,000 MCG TOTAL) INTO THE MUSCLE ONCE FOR 1 DOSE. 5 mL 1  . folic acid (FOLVITE) 1 MG tablet TAKE 1 TABLET BY MOUTH EVERY DAY 90 tablet 1  . lidocaine-prilocaine (EMLA) cream Apply to the Port-A-Cath site 30-60 minutes before treatment 30 g 0  . prochlorperazine (COMPAZINE) 10 MG tablet Take 1 tablet (10 mg total) by mouth every 6 (six) hours as needed for nausea or vomiting. 30 tablet 0  . sildenafil (REVATIO) 20 MG tablet TAKE AS DIRECTED 10 tablet 11  . SUMAtriptan (IMITREX) 50 MG tablet Take 50 mg by mouth every 2 (two) hours as needed for migraine.     . traMADol (ULTRAM) 50 MG tablet Take 1 tablet (50 mg total) by mouth every 6 (six) hours as needed for severe pain. 20 tablet 0   No current facility-administered medications for this visit.    SURGICAL HISTORY:  Past Surgical History:  Procedure Laterality Date  . CAROTID DOPPLERS  09/2017   Mild non-obstructive disease  . CATARACT EXTRACTION, BILATERAL    . cataract, left  2018  . CHEST TUBE INSERTION Right 03/29/2019   Procedure: INSERTION PLEURAL DRAINAGE CATHETER;  Surgeon: [name], MD;  [facility]  OR;  Service: Thoracic;  Laterality: Right;  . COLONOSCOPY  2016   clear, repeat in 10 yrs   . Eye surgeries     , Lens attachment.  Vitrectomy  . FEMORAL HERNIA REPAIR    . HERNIA REPAIR    . IR THORACENTESIS ASP PLEURAL SPACE W/IMG GUIDE  02/22/2019  . IR THORACENTESIS ASP PLEURAL SPACE W/IMG GUIDE  03/13/2019  . LUMBAR LAMINECTOMY    . PLEURAL BIOPSY Right 03/29/2019   Procedure: PLEURAL BIOPSY;  Surgeon: [name], MD;  Location: [place];  Service: Thoracic;  Laterality: Right;  . PORTACATH PLACEMENT  N/A 03/29/2019   Procedure: INSERTION PORT-A-CATH;  Surgeon: [name], MD;  Location: [place];  Service: Thoracic;  Laterality: N/A;  . retinal attachment, right    . sclearl buckle, right    . victrectomy,right    . VIDEO ASSISTED THORACOSCOPY Right 03/29/2019   Procedure: VIDEO ASSISTED THORACOSCOPY;  Surgeon: [name], MD;  Location: [facility] OR;  Service: Thoracic;  Laterality: Right;    REVIEW OF SYSTEMS:  A comprehensive review of systems was negative.   PHYSICAL EXAMINATION: General appearance: alert, cooperative and no distress Head: Normocephalic, without obvious abnormality, atraumatic Neck: no adenopathy, no JVD, supple, symmetrical, trachea midline and thyroid not enlarged, symmetric, no tenderness/mass/nodules Lymph nodes: Cervical, supraclavicular, and axillary nodes normal. Resp: clear to auscultation bilaterally Back: symmetric, no curvature. ROM normal. No CVA tenderness. Cardio: regular rate and rhythm, S1, S2 normal, no murmur, click, rub or gallop GI: soft, non-tender; bowel sounds normal; no masses,  no organomegaly Extremities: extremities normal, atraumatic, no cyanosis or edema  ECOG PERFORMANCE STATUS: 1 - Symptomatic but completely ambulatory  Blood pressure 128/79, pulse 73, temperature (!) 97.1 F (36.2 C), temperature source Temporal, resp. rate 18, height '5\' 11"'$  (1.803 m), weight 199 lb 12.8 oz (90.6 kg), SpO2 99 %.  LABORATORY DATA: Lab Results  Component Value Date   WBC 5.3 08/29/2019   HGB 12.9 (L) 08/29/2019   HCT 38.4 (L) 08/29/2019   MCV 99.2 08/29/2019   PLT 232 08/29/2019      Chemistry      Component Value Date/Time   NA 140 08/08/2019 1058   K 4.5 08/08/2019 1058   CL 105 08/08/2019 1058   CO2 26 08/08/2019 1058   BUN 15 08/08/2019 1058   CREATININE 0.99 08/08/2019 1058      Component Value Date/Time   CALCIUM 8.9 08/08/2019 1058   ALKPHOS 85 08/08/2019 1058   AST 37 08/08/2019 1058   ALT 38 08/08/2019 1058    BILITOT 0.3 08/08/2019 1058       RADIOGRAPHIC STUDIES: DG Chest 2 View  Result Date: 08/16/2019 CLINICAL DATA:  Lung cancer. EXAM: CHEST - 2 VIEW COMPARISON:  07/11/2019. FINDINGS: Interval removal of right PleurX catheter. PowerPort catheter in stable position. Mediastinum and hilar structures normal. Heart size normal. Mild right base infiltrate and right-sided pleural effusion unchanged. No pneumothorax no acute bony abnormality. IMPRESSION: 1. Interval removal of right PleurX catheter. Port-A-Cath in stable position. 2. Stable mild right base infiltrate and right-sided pleural effusion. Electronically Signed   By: Marcello Moores  Register   On: 08/16/2019 15:14   CT Chest W Contrast  Addendum Date: 08/07/2019   ADDENDUM REPORT: 08/07/2019 11:00 ADDENDUM: Note that the abnormality seen in the right hemi liver on the previous exam along the margin of the right hepatic lobe is no longer visible. Also small retrocaval lymph node referenced on previous exam and on  a previous PET evaluation is stable to slightly smaller at approximately 5 mm. Electronically Signed   By: Zetta Bills M.D.   On: 08/07/2019 11:00   Result Date: 08/07/2019 CLINICAL DATA:  Non-small cell lung cancer staging. EXAM: CT CHEST, ABDOMEN, AND PELVIS WITH CONTRAST TECHNIQUE: Multidetector CT imaging of the chest, abdomen and pelvis was performed following the standard protocol during bolus administration of intravenous contrast. CONTRAST:  144m OMNIPAQUE IOHEXOL 300 MG/ML  SOLN COMPARISON:  08/03/2019 FINDINGS: CT CHEST FINDINGS Cardiovascular: Heart size is stable. Aortic caliber is normal. Right-sided Port-A-Cath terminates in the distal superior vena cava. Mediastinum/Nodes: Right paratracheal/precarinal lymph node (image 27, series 2 13 mm short axis dimension as compared to 16 mm. Mild fullness of right hilar nodal tissue without frank adenopathy or mass. No signs of left hilar adenopathy. No signs of supraclavicular adenopathy.  No axillary adenopathy. Lungs/Pleura: Signs of pleural nodularity in the right chest with similar appearance. Right juxta hilar mass seen along the minor fissure, in the right middle lobe (image 79, series 4) 1.7 by select 1.7 x 1.7 x 1.4 cm previously approximately 1.9 by 1.6 cm when measured in a similar fashion. Pleural nodularity is similar to the previous exam. Stable appearance of right-sided pleural effusion with pleural thickening. Anterior right middle lobe nodule measuring 1.3 cm in greatest axial dimension (image 118, series 4) previously 1.5 cm. Musculoskeletal: No sign of chest wall mass or destructive bone process. CT ABDOMEN PELVIS FINDINGS Hepatobiliary: No signs of focal, suspicious hepatic lesion. Biliary tree is normal. Pancreas: Unremarkable. No pancreatic ductal dilatation or surrounding inflammatory changes. Spleen: Normal in size without focal abnormality. Adrenals/Urinary Tract: The adrenal glands are normal. The kidneys enhance symmetrically without focal, suspicious lesion or hydronephrosis. Urinary bladder is unremarkable. Stomach/Bowel: No signs of acute gastrointestinal process appendix seen in right lower quadrant and is normal. Vascular/Lymphatic: No signs of acute vascular process. No signs of upper abdominal or retroperitoneal adenopathy. No signs of pelvic lymphadenopathy. Reproductive: Heterogeneous prostate. No sign abnormality on CT. Other: No abdominal wall hernia or abnormality. No abdominopelvic ascites. Musculoskeletal: No acute or destructive bone process. IMPRESSION: 1. Signs of pleural nodularity in the right chest with similar appearance to the previous exam. 2. Slight interval decrease in size of dominant right lung nodules and mediastinal adenopathy. 3. Stable right-sided pleural effusion with pleural thickening. 4. No evidence for metastatic disease within the abdomen or pelvis. 5. Stable heterogeneous prostate gland. 6. Aortic atherosclerosis. Aortic Atherosclerosis  (ICD10-I70.0). Electronically Signed: By: GZetta BillsM.D. On: 08/07/2019 09:43   CT Abdomen Pelvis W Contrast  Addendum Date: 08/07/2019   ADDENDUM REPORT: 08/07/2019 11:00 ADDENDUM: Note that the abnormality seen in the right hemi liver on the previous exam along the margin of the right hepatic lobe is no longer visible. Also small retrocaval lymph node referenced on previous exam and on a previous PET evaluation is stable to slightly smaller at approximately 5 mm. Electronically Signed   By: GZetta BillsM.D.   On: 08/07/2019 11:00   Result Date: 08/07/2019 CLINICAL DATA:  Non-small cell lung cancer staging. EXAM: CT CHEST, ABDOMEN, AND PELVIS WITH CONTRAST TECHNIQUE: Multidetector CT imaging of the chest, abdomen and pelvis was performed following the standard protocol during bolus administration of intravenous contrast. CONTRAST:  1074mOMNIPAQUE IOHEXOL 300 MG/ML  SOLN COMPARISON:  08/03/2019 FINDINGS: CT CHEST FINDINGS Cardiovascular: Heart size is stable. Aortic caliber is normal. Right-sided Port-A-Cath terminates in the distal superior vena cava. Mediastinum/Nodes: Right paratracheal/precarinal lymph node (image  27, series 2 13 mm short axis dimension as compared to 16 mm. Mild fullness of right hilar nodal tissue without frank adenopathy or mass. No signs of left hilar adenopathy. No signs of supraclavicular adenopathy. No axillary adenopathy. Lungs/Pleura: Signs of pleural nodularity in the right chest with similar appearance. Right juxta hilar mass seen along the minor fissure, in the right middle lobe (image 79, series 4) 1.7 by select 1.7 x 1.7 x 1.4 cm previously approximately 1.9 by 1.6 cm when measured in a similar fashion. Pleural nodularity is similar to the previous exam. Stable appearance of right-sided pleural effusion with pleural thickening. Anterior right middle lobe nodule measuring 1.3 cm in greatest axial dimension (image 118, series 4) previously 1.5 cm. Musculoskeletal: No  sign of chest wall mass or destructive bone process. CT ABDOMEN PELVIS FINDINGS Hepatobiliary: No signs of focal, suspicious hepatic lesion. Biliary tree is normal. Pancreas: Unremarkable. No pancreatic ductal dilatation or surrounding inflammatory changes. Spleen: Normal in size without focal abnormality. Adrenals/Urinary Tract: The adrenal glands are normal. The kidneys enhance symmetrically without focal, suspicious lesion or hydronephrosis. Urinary bladder is unremarkable. Stomach/Bowel: No signs of acute gastrointestinal process appendix seen in right lower quadrant and is normal. Vascular/Lymphatic: No signs of acute vascular process. No signs of upper abdominal or retroperitoneal adenopathy. No signs of pelvic lymphadenopathy. Reproductive: Heterogeneous prostate. No sign abnormality on CT. Other: No abdominal wall hernia or abnormality. No abdominopelvic ascites. Musculoskeletal: No acute or destructive bone process. IMPRESSION: 1. Signs of pleural nodularity in the right chest with similar appearance to the previous exam. 2. Slight interval decrease in size of dominant right lung nodules and mediastinal adenopathy. 3. Stable right-sided pleural effusion with pleural thickening. 4. No evidence for metastatic disease within the abdomen or pelvis. 5. Stable heterogeneous prostate gland. 6. Aortic atherosclerosis. Aortic Atherosclerosis (ICD10-I70.0). Electronically Signed: By: Zetta Bills M.D. On: 08/07/2019 09:43    ASSESSMENT AND PLAN: This is a very pleasant 69 years old white male recently diagnosed with a stage IV (T2b, N2, M1C) non-small cell lung cancer, adenocarcinoma with positive EGFR mutation in exon 20 (resistant mutation) diagnosed in July 2020 and presented with extensive disease involving the right upper lobe as well as the right middle and lower lobe with extensive pleural based metastasis as well as mediastinal and hilar disease with malignant pleural effusion as well as liver and brain  metastasis. Unfortunately the patient has no actionable mutations based on the molecular studies by [employer].   The patient is currently undergoing systemic chemotherapy with carboplatin for AUC of 5, Alimta 500 mg/M2 and Keytruda 200 mg IV every 3 weeks status post 7 cycles. The patient continues to tolerate his treatment well with no concerning adverse effects. I recommended for him to proceed with cycle #8 today as planned. He will come back for follow-up visit in 3 weeks for evaluation before starting cycle #9. I give him refill of Compazine today. For the prostate cancer, the patient will continue his routine follow-up visit by his urologist. He was advised to call immediately if he has any concerning symptoms in the interval. The patient voices understanding of current disease status and treatment options and is in agreement with the current care plan. All questions were answered. The patient knows to call the clinic with any problems, questions or concerns. We can certainly see the patient much sooner if necessary.  Disclaimer: This note was dictated with voice recognition software. Similar sounding words can inadvertently be transcribed and may not  be corrected upon review.

## 2019-08-29 NOTE — Patient Instructions (Signed)

## 2019-09-06 ENCOUNTER — Other Ambulatory Visit: Payer: Self-pay | Admitting: Radiation Therapy

## 2019-09-08 ENCOUNTER — Encounter: Payer: Self-pay | Admitting: *Deleted

## 2019-09-11 ENCOUNTER — Encounter: Payer: Self-pay | Admitting: *Deleted

## 2019-09-18 NOTE — Progress Notes (Signed)
[facility] OFFICE PROGRESS NOTE  Laurey Morale, MD [place] [address]  DIAGNOSIS: Stage IV (T2b, N2, M1c)presented with right middle lobe lung mass in addition to mediastinal lymphadenopathy as well as malignant right pleural effusion with pleural-based nodules and suspicious right hepatic lesionand the brain metastasisdiagnosed in July 2020.  Molecular Biomarkers: [id number] (Exon 20 insertion) 0.2% Dacomitinib,Neratinib,Osimertinib  [id number] 0.1% None  PRIOR THERAPY: SRS treatment to the metastatic disease to the brain under the care of Dr. Tammi Klippel. Completed on 04/07/2019.  CURRENT THERAPY: Systemic chemotherapy with carboplatin for AUC of 5, Alimta 500 mg/M2 and Keytruda 200 mg IV every 3 weeks. First dose April 04, 2019. Status post 8 cycles. Starting from cycle #5 he has been on maintenance Alimta and Keytruda every 3 weeks.   INTERVAL HISTORY: Torrance Stockley 69 y.o. male returns to the clinic for a follow up visit. The patient is feeling well today without any concerning complaints. The patient has been feeling well lately and has been physically active, including walking several days a week and doing yoga stretches.The patient continues to tolerate treatment with Alimta and Keytruda well without any concerning adverse side effects. Denies any fever, chills, night sweats, or weight loss. Denies any chest pain, shortness of breath, cough, or hemoptysis. Denies any vomiting, diarrhea, or constipation but occasionally experiences a queesy feeling in his stomach. Denies any visual changes. He occasionally has a headache. He is scheduled for a repeat brain MRI next month for his history of metastatic disease to the brain. Denies any rashes or skin changes. The patient is here today for evaluation prior to starting cycle #9  MEDICAL HISTORY: Past Medical History:  Diagnosis Date  . Borderline hypertension   . Borderline  systolic hypertension   . Brain metastasis ([place]) dx'd 01/2019  . Detached vitreous humor, left   . Dyspnea   . ED (erectile dysfunction) of organic origin   . Hyperlipidemia   . Hypertension   . Lens subluxation, left   . Lung cancer ([facility]) dx'd 01/2019  . Migraine   . Migraine headache    takes PRN Imitrex  . Pseudophakia   . TGA (transient global amnesia) 2017  . Transient global amnesia     ALLERGIES:  has No Known Allergies.  MEDICATIONS:  Current Outpatient Medications  Medication Sig Dispense Refill  . benzonatate (TESSALON) 200 MG capsule Take 1 capsule (200 mg total) by mouth 3 (three) times daily as needed for cough. 30 capsule 0  . cyanocobalamin (,VITAMIN B-12,) 1000 MCG/ML injection INJECT 1 ML (1,000 MCG TOTAL) INTO THE MUSCLE ONCE FOR 1 DOSE. 5 mL 1  . folic acid (FOLVITE) 1 MG tablet TAKE 1 TABLET BY MOUTH EVERY DAY 90 tablet 1  . lidocaine-prilocaine (EMLA) cream Apply to the Port-A-Cath site 30-60 minutes before treatment 30 g 0  . prochlorperazine (COMPAZINE) 10 MG tablet Take 1 tablet (10 mg total) by mouth every 6 (six) hours as needed for nausea or vomiting. 30 tablet 0  . sildenafil (REVATIO) 20 MG tablet TAKE AS DIRECTED 10 tablet 11  . SUMAtriptan (IMITREX) 50 MG tablet Take 50 mg by mouth every 2 (two) hours as needed for migraine.     . traMADol (ULTRAM) 50 MG tablet Take 1 tablet (50 mg total) by mouth every 6 (six) hours as needed for severe pain. 20 tablet 0   No current facility-administered medications for this visit.   Facility-Administered Medications Ordered in Other Visits  Medication  Dose Route Frequency Provider Last Rate Last Admin  . heparin lock flush 100 unit/mL  500 Units Intracatheter Once PRN Curt Bears, MD      . pembrolizumab [facility]) 200 mg in sodium chloride 0.9 % 50 mL chemo infusion  200 mg Intravenous Once Curt Bears, MD      . PEMEtrexed (ALIMTA) 1,000 mg in sodium chloride 0.9 % 100 mL chemo infusion  475 mg/m2  (Treatment Plan Recorded) Intravenous Once Curt Bears, MD      . prochlorperazine (COMPAZINE) tablet 10 mg  10 mg Oral Once Curt Bears, MD      . sodium chloride flush (NS) 0.9 % injection 10 mL  10 mL Intracatheter PRN Curt Bears, MD        SURGICAL HISTORY:  Past Surgical History:  Procedure Laterality Date  . CAROTID DOPPLERS  09/2017   Mild non-obstructive disease  . CATARACT EXTRACTION, BILATERAL    . cataract, left  2018  . CHEST TUBE INSERTION Right 03/29/2019   Procedure: INSERTION PLEURAL DRAINAGE CATHETER;  Surgeon: [name], MD;  Location: [facility];  Service: Thoracic;  Laterality: Right;  . COLONOSCOPY  2016   clear, repeat in 10 yrs   . Eye surgeries     , Lens attachment.  Vitrectomy  . FEMORAL HERNIA REPAIR    . HERNIA REPAIR    . IR THORACENTESIS ASP PLEURAL SPACE W/IMG GUIDE  02/22/2019  . IR THORACENTESIS ASP PLEURAL SPACE W/IMG GUIDE  03/13/2019  . LUMBAR LAMINECTOMY    . PLEURAL BIOPSY Right 03/29/2019   Procedure: PLEURAL BIOPSY;  Surgeon: [name], MD;  Location: [facility];  Service: Thoracic;  Laterality: Right;  . PORTACATH PLACEMENT N/A 03/29/2019   Procedure: INSERTION PORT-A-CATH;  Surgeon: [name], MD;  Location: [facility];  Service: Thoracic;  Laterality: N/A;  . retinal attachment, right    . sclearl buckle, right    . victrectomy,right    . VIDEO ASSISTED THORACOSCOPY Right 03/29/2019   Procedure: VIDEO ASSISTED THORACOSCOPY;  Surgeon: [name], MD;  Location[facility];  Service: Thoracic;  Laterality: Right;    REVIEW OF SYSTEMS:   Review of Systems  Constitutional: Negative for appetite change, chills, fatigue, fever and unexpected weight change.  HENT:   Negative for mouth sores, nosebleeds, sore throat and trouble swallowing.   Eyes: Negative for eye problems and icterus.  Respiratory: Negative for cough, hemoptysis, shortness of breath and wheezing.  Cardiovascular: Negative for chest pain and  leg swelling.  Gastrointestinal: Positive for mild uneasy feeling/queesiness. Negative for abdominal pain, constipation, diarrhea, and vomiting.  Genitourinary: Negative for bladder incontinence, difficulty urinating, dysuria, frequency and hematuria.   Musculoskeletal: Negative for back pain, gait problem, neck pain and neck stiffness.  Skin: Negative for itching and rash.  Neurological: Negative for dizziness, extremity weakness, gait problem, headaches, light-headedness and seizures.  Hematological: Negative for adenopathy. Does not bruise/bleed easily.  Psychiatric/Behavioral: Negative for confusion, depression and sleep disturbance. The patient is not nervous/anxious.     PHYSICAL EXAMINATION:  Blood pressure 136/81, pulse 73, temperature 98 F (36.7 C), temperature source Temporal, resp. rate 18, height '5\' 11"'  (1.803 m), weight 203 lb 1.6 oz (92.1 kg), SpO2 99 %.  ECOG PERFORMANCE STATUS: 1 - Symptomatic but completely ambulatory  Physical Exam  Constitutional: Oriented to person, place, and time and well-developed, well-nourished, and in no distress.  HENT:  Head: Normocephalic and atraumatic.  Mouth/Throat: Oropharynx is clear and moist. No oropharyngeal exudate.  Eyes: Conjunctivae are normal. Right eye exhibits no discharge. Left eye exhibits no discharge. No scleral icterus.  Neck: Normal range of motion. Neck supple.  Cardiovascular: Normal rate, regular rhythm, normal heart sounds and intact distal pulses.   Pulmonary/Chest: Effort normal and breath sounds normal. Decrease in breath sounds at base of right lung. No respiratory distress. No wheezes. No rales.  Abdominal: Soft. Bowel sounds are normal. Exhibits no distension and no mass. There is no tenderness.  Musculoskeletal: Normal range of motion. Exhibits no edema.  Lymphadenopathy:    No cervical adenopathy.  Neurological: Alert and oriented to person, place, and time. Exhibits normal muscle tone. Gait normal.  Coordination normal.  Skin: Skin is warm and dry. No rash noted. Not diaphoretic. No erythema. No pallor.  Psychiatric: Mood, memory and judgment normal.  Vitals reviewed.  LABORATORY DATA: Lab Results  Component Value Date   WBC 3.9 (L) 09/19/2019   HGB 13.1 09/19/2019   HCT 39.3 09/19/2019   MCV 97.8 09/19/2019   PLT 231 09/19/2019      Chemistry      Component Value Date/Time   NA 140 09/19/2019 0944   K 4.0 09/19/2019 0944   CL 107 09/19/2019 0944   CO2 26 09/19/2019 0944   BUN 23 09/19/2019 0944   CREATININE 1.01 09/19/2019 0944      Component Value Date/Time   CALCIUM 8.7 (L) 09/19/2019 0944   ALKPHOS 73 09/19/2019 0944   AST 24 09/19/2019 0944   ALT 24 09/19/2019 0944   BILITOT 0.3 09/19/2019 0944       RADIOGRAPHIC STUDIES:  No results found.   ASSESSMENT/PLAN:  This is a very pleasant 69 year old Caucasian male who was recently diagnosed with stage IV non-small cell lung cancer, adenocarcinoma with a positive EGFR resistant mutation in exon 20.  He presented with extensive disease involving the right upper lobe, right middle lobe, and right lower lobe with extensive pleural-based metastases.  He also has mediastinal and hilar adenopathy and a right malignant pleural effusion.  He also has metastatic disease to the brain and a suspicious liver lesion.  He completed SRS treatment to the metastatic disease to the brain under care of Dr. Tammi Klippel.   He had a pleurex catheter placed under the care of Dr. Roxan Hockey for his malignant pleural effusion. This was removed 07/11/2019  He is currently undergoing systemic chemotherapy with carboplatin AUC of 5, Alimta 500 mg/m, and Keytruda 200 mg IV every 3 weeks.  He is status post 8 cycles. Starting from cycle #5, he has been on maintenance Alimta 500 mg/m2 and Keytruda 200 mg IV every 3 weeks.    Labs were reviewed. Recommend that he proceed with cycle #9 today as scheduled.   I will arrange for a restaging CT  scan of his chest, abdomen, and pelvis to be performed prior to his next visit.   I will see him back for a follow up visit in 3 weeks for evaluation and to review his scan results before starting cycle #10.   The patient was advised to call immediately if he has any concerning symptoms in the interval. The patient voices understanding of current disease status and treatment options and is in agreement with the current care plan. All questions were answered. The patient knows to call the clinic with any problems, questions or concerns. We can certainly see the patient much sooner if necessary.     Orders Placed This Encounter  Procedures  . CT Chest W Contrast  Standing Status:   Future    Standing Expiration Date:   09/18/2020    Order Specific Question:   ** REASON FOR EXAM (FREE TEXT)    Answer:   Restaging Lung Cancer    Order Specific Question:   If indicated for the ordered procedure, I authorize the administration of contrast media per Radiology protocol    Answer:   Yes    Order Specific Question:   Preferred imaging location?    Answer:   [facility]    Order Specific Question:   Radiology Contrast Protocol - do NOT remove file path    Answer:   \\[email or web address]  . CT Abdomen Pelvis W Contrast    Standing Status:   Future    Standing Expiration Date:   09/18/2020    Order Specific Question:   ** REASON FOR EXAM (FREE TEXT)    Answer:   restaging lung cancer    Order Specific Question:   If indicated for the ordered procedure, I authorize the administration of contrast media per Radiology protocol    Answer:   Yes    Order Specific Question:   Preferred imaging location?    Answer:   [facility]    Order Specific Question:   Is Oral Contrast requested for this exam?    Answer:   Yes, Per Radiology protocol    Order Specific Question:   Radiology Contrast Protocol - do NOT remove file path    Answer:    \\[email or web address]     [place], PA-C 09/19/19

## 2019-09-19 ENCOUNTER — Inpatient Hospital Stay: Payer: Medicare Other

## 2019-09-19 ENCOUNTER — Inpatient Hospital Stay (HOSPITAL_BASED_OUTPATIENT_CLINIC_OR_DEPARTMENT_OTHER): Payer: Medicare Other | Admitting: Physician Assistant

## 2019-09-19 ENCOUNTER — Encounter: Payer: Self-pay | Admitting: Physician Assistant

## 2019-09-19 ENCOUNTER — Other Ambulatory Visit: Payer: Self-pay

## 2019-09-19 VITALS — BP 136/81 | HR 73 | Temp 98.0°F | Resp 18 | Ht 71.0 in | Wt 203.1 lb

## 2019-09-19 DIAGNOSIS — C61 Malignant neoplasm of prostate: Secondary | ICD-10-CM | POA: Diagnosis not present

## 2019-09-19 DIAGNOSIS — E079 Disorder of thyroid, unspecified: Secondary | ICD-10-CM

## 2019-09-19 DIAGNOSIS — C7931 Secondary malignant neoplasm of brain: Secondary | ICD-10-CM | POA: Diagnosis not present

## 2019-09-19 DIAGNOSIS — C3411 Malignant neoplasm of upper lobe, right bronchus or lung: Secondary | ICD-10-CM | POA: Diagnosis not present

## 2019-09-19 DIAGNOSIS — C3491 Malignant neoplasm of unspecified part of right bronchus or lung: Secondary | ICD-10-CM | POA: Diagnosis not present

## 2019-09-19 DIAGNOSIS — Z5112 Encounter for antineoplastic immunotherapy: Secondary | ICD-10-CM | POA: Diagnosis not present

## 2019-09-19 DIAGNOSIS — Z5111 Encounter for antineoplastic chemotherapy: Secondary | ICD-10-CM | POA: Diagnosis not present

## 2019-09-19 DIAGNOSIS — J984 Other disorders of lung: Secondary | ICD-10-CM | POA: Diagnosis not present

## 2019-09-19 DIAGNOSIS — C787 Secondary malignant neoplasm of liver and intrahepatic bile duct: Secondary | ICD-10-CM | POA: Diagnosis not present

## 2019-09-19 LAB — CMP (CANCER CENTER ONLY)
ALT: 24 U/L (ref 0–44)
AST: 24 U/L (ref 15–41)
Albumin: 3.4 g/dL — ABNORMAL LOW (ref 3.5–5.0)
Alkaline Phosphatase: 73 U/L (ref 38–126)
Anion gap: 7 (ref 5–15)
BUN: 23 mg/dL (ref 8–23)
CO2: 26 mmol/L (ref 22–32)
Calcium: 8.7 mg/dL — ABNORMAL LOW (ref 8.9–10.3)
Chloride: 107 mmol/L (ref 98–111)
Creatinine: 1.01 mg/dL (ref 0.61–1.24)
GFR, Est AFR Am: >60 mL/min (ref >60)
GFR, Estimated: >60 mL/min (ref >60)
Glucose, Bld: 96 mg/dL (ref 70–99)
Potassium: 4 mmol/L (ref 3.5–5.1)
Sodium: 140 mmol/L (ref 135–145)
Total Bilirubin: 0.3 mg/dL (ref 0.3–1.2)
Total Protein: 6.5 g/dL (ref 6.5–8.1)

## 2019-09-19 LAB — CBC WITH DIFFERENTIAL (CANCER CENTER ONLY)
Abs Immature Granulocytes: 0.01 10*3/uL (ref 0.00–0.07)
Basophils Absolute: 0 10*3/uL (ref 0.0–0.1)
Basophils Relative: 1 %
Eosinophils Absolute: 0.1 10*3/uL (ref 0.0–0.5)
Eosinophils Relative: 1 %
HCT: 39.3 % (ref 39.0–52.0)
Hemoglobin: 13.1 g/dL (ref 13.0–17.0)
Immature Granulocytes: 0 %
Lymphocytes Relative: 22 %
Lymphs Abs: 0.8 10*3/uL (ref 0.7–4.0)
MCH: 32.6 pg (ref 26.0–34.0)
MCHC: 33.3 g/dL (ref 30.0–36.0)
MCV: 97.8 fL (ref 80.0–100.0)
Monocytes Absolute: 0.3 10*3/uL (ref 0.1–1.0)
Monocytes Relative: 9 %
Neutro Abs: 2.6 10*3/uL (ref 1.7–7.7)
Neutrophils Relative %: 67 %
Platelet Count: 231 10*3/uL (ref 150–400)
RBC: 4.02 MIL/uL — ABNORMAL LOW (ref 4.22–5.81)
RDW: 12.7 % (ref 11.5–15.5)
WBC Count: 3.9 10*3/uL — ABNORMAL LOW (ref 4.0–10.5)
nRBC: 0 % (ref 0.0–0.2)

## 2019-09-19 LAB — TSH: TSH: 1.01 u[IU]/mL (ref 0.320–4.118)

## 2019-09-19 MED ORDER — SODIUM CHLORIDE 0.9 % IV SOLN
200.0000 mg | Freq: Once | INTRAVENOUS | Status: AC
  Administered 2019-09-19: 200 mg via INTRAVENOUS
  Filled 2019-09-19: qty 8

## 2019-09-19 MED ORDER — PROCHLORPERAZINE MALEATE 10 MG PO TABS: 10.0000 mg | ORAL_TABLET | Freq: Once | ORAL | Status: DC

## 2019-09-19 MED ORDER — HEPARIN SOD (PORK) LOCK FLUSH 100 UNIT/ML IV SOLN
500.0000 [IU] | Freq: Once | INTRAVENOUS | Status: AC | PRN
  Administered 2019-09-19: 500 [IU]
  Filled 2019-09-19: qty 5

## 2019-09-19 MED ORDER — SODIUM CHLORIDE 0.9 % IV SOLN
Freq: Once | INTRAVENOUS | Status: AC
  Filled 2019-09-19: qty 250

## 2019-09-19 MED ORDER — SODIUM CHLORIDE 0.9 % IV SOLN
475.0000 mg/m2 | Freq: Once | INTRAVENOUS | Status: AC
  Administered 2019-09-19: 1000 mg via INTRAVENOUS
  Filled 2019-09-19: qty 40

## 2019-09-19 MED ORDER — SODIUM CHLORIDE 0.9% FLUSH
10.0000 mL | INTRAVENOUS | Status: DC | PRN
  Administered 2019-09-19: 10 mL
  Filled 2019-09-19: qty 10

## 2019-09-19 NOTE — Patient Instructions (Signed)
[facility] Discharge Instructions for Patients Receiving Chemotherapy  Today you received the following chemotherapy agents Keytruda, Alimta  To help prevent nausea and vomiting after your treatment, we encourage you to take your nausea medication.   If you develop nausea and vomiting that is not controlled by your nausea medication, call the clinic.   BELOW ARE SYMPTOMS THAT SHOULD BE REPORTED IMMEDIATELY:  *FEVER GREATER THAN 100.5 F  *CHILLS WITH OR WITHOUT FEVER  NAUSEA AND VOMITING THAT IS NOT CONTROLLED WITH YOUR NAUSEA MEDICATION  *UNUSUAL SHORTNESS OF BREATH  *UNUSUAL BRUISING OR BLEEDING  TENDERNESS IN MOUTH AND THROAT WITH OR WITHOUT PRESENCE OF ULCERS  *URINARY PROBLEMS  *BOWEL PROBLEMS  UNUSUAL RASH Items with * indicate a potential emergency and should be followed up as soon as possible.  Feel free to call the clinic should you have any questions or concerns. The clinic phone number is [phone].  Please show the [facility] at check-in to the Emergency Department and triage nurse.

## 2019-09-20 ENCOUNTER — Encounter: Payer: Self-pay | Admitting: *Deleted

## 2019-09-20 ENCOUNTER — Telehealth: Payer: Self-pay | Admitting: Internal Medicine

## 2019-09-20 NOTE — Telephone Encounter (Signed)
Scheduled per los. Called and left msg. Mailed printout  °

## 2019-09-21 ENCOUNTER — Ambulatory Visit: Payer: Medicare Other

## 2019-09-21 ENCOUNTER — Other Ambulatory Visit: Payer: Self-pay | Admitting: Medical Oncology

## 2019-09-21 DIAGNOSIS — Z5111 Encounter for antineoplastic chemotherapy: Secondary | ICD-10-CM

## 2019-09-21 MED ORDER — FOLIC ACID 1 MG PO TABS: 1.0000 mg | ORAL_TABLET | Freq: Every day | ORAL | 1 refills | Status: DC

## 2019-09-26 ENCOUNTER — Ambulatory Visit: Payer: Medicare Other

## 2019-09-29 ENCOUNTER — Ambulatory Visit: Payer: Medicare Other | Attending: Internal Medicine

## 2019-09-29 DIAGNOSIS — Z23 Encounter for immunization: Secondary | ICD-10-CM | POA: Insufficient documentation

## 2019-09-29 NOTE — Progress Notes (Signed)
   Covid-19 Vaccination Clinic  Name:  Ethan Brown    MRN: [id number] DOB: July 16, 1951  09/29/2019  Ethan Brown was observed post Covid-19 immunization for 15 minutes without incidence. He was provided with Vaccine Information Sheet and instruction to access the V-Safe system.   Ethan Brown was instructed to call 911 with any severe reactions post vaccine: Marland Kitchen Difficulty breathing  . Swelling of your face and throat  . A fast heartbeat  . A bad rash all over your body  . Dizziness and weakness    Immunizations Administered    Name Date Dose VIS Date Route   Pfizer COVID-19 Vaccine 09/29/2019  4:52 PM 0.3 mL 08/04/2019 Intramuscular   Manufacturer: [employer]   Lot: [id number]   [place]: [id number]

## 2019-10-05 ENCOUNTER — Ambulatory Visit
Admission: RE | Admit: 2019-10-05 | Discharge: 2019-10-05 | Disposition: A | Discharge status: Home / Self Care | Payer: Medicare Other | Source: Ambulatory Visit | Attending: Radiation Oncology | Admitting: Radiation Oncology

## 2019-10-05 DIAGNOSIS — C7931 Secondary malignant neoplasm of brain: Secondary | ICD-10-CM | POA: Diagnosis not present

## 2019-10-05 MED ORDER — GADOBENATE DIMEGLUMINE 529 MG/ML IV SOLN
18.0000 mL | Freq: Once | INTRAVENOUS | Status: AC | PRN
  Administered 2019-10-05: 18 mL via INTRAVENOUS

## 2019-10-06 ENCOUNTER — Other Ambulatory Visit: Payer: Self-pay

## 2019-10-06 ENCOUNTER — Ambulatory Visit (HOSPITAL_COMMUNITY)
Admission: RE | Admit: 2019-10-06 | Discharge: 2019-10-06 | Disposition: A | Discharge status: Home / Self Care | Payer: Medicare Other | Source: Ambulatory Visit | Attending: Physician Assistant | Admitting: Physician Assistant

## 2019-10-06 DIAGNOSIS — C3491 Malignant neoplasm of unspecified part of right bronchus or lung: Secondary | ICD-10-CM | POA: Diagnosis not present

## 2019-10-06 DIAGNOSIS — C3490 Malignant neoplasm of unspecified part of unspecified bronchus or lung: Secondary | ICD-10-CM | POA: Diagnosis not present

## 2019-10-06 MED ORDER — SODIUM CHLORIDE (PF) 0.9 % IJ SOLN
INTRAMUSCULAR | Status: AC
  Filled 2019-10-06: qty 50

## 2019-10-06 MED ORDER — IOHEXOL 300 MG/ML  SOLN
100.0000 mL | Freq: Once | INTRAMUSCULAR | Status: AC | PRN
  Administered 2019-10-06: 100 mL via INTRAVENOUS

## 2019-10-10 ENCOUNTER — Inpatient Hospital Stay: Payer: Medicare Other

## 2019-10-10 ENCOUNTER — Encounter: Payer: Self-pay | Admitting: Internal Medicine

## 2019-10-10 ENCOUNTER — Inpatient Hospital Stay: Payer: Medicare Other | Attending: Internal Medicine | Admitting: Internal Medicine

## 2019-10-10 ENCOUNTER — Other Ambulatory Visit: Payer: Self-pay | Admitting: Internal Medicine

## 2019-10-10 ENCOUNTER — Other Ambulatory Visit: Payer: Self-pay

## 2019-10-10 VITALS — BP 125/84 | HR 74 | Temp 98.2°F | Resp 17 | Ht 71.0 in | Wt 203.7 lb

## 2019-10-10 DIAGNOSIS — J90 Pleural effusion, not elsewhere classified: Secondary | ICD-10-CM | POA: Diagnosis not present

## 2019-10-10 DIAGNOSIS — C3491 Malignant neoplasm of unspecified part of right bronchus or lung: Secondary | ICD-10-CM

## 2019-10-10 DIAGNOSIS — C787 Secondary malignant neoplasm of liver and intrahepatic bile duct: Secondary | ICD-10-CM | POA: Diagnosis not present

## 2019-10-10 DIAGNOSIS — Z95828 Presence of other vascular implants and grafts: Secondary | ICD-10-CM

## 2019-10-10 DIAGNOSIS — Z5112 Encounter for antineoplastic immunotherapy: Secondary | ICD-10-CM | POA: Diagnosis not present

## 2019-10-10 DIAGNOSIS — C3411 Malignant neoplasm of upper lobe, right bronchus or lung: Secondary | ICD-10-CM | POA: Insufficient documentation

## 2019-10-10 DIAGNOSIS — Z79899 Other long term (current) drug therapy: Secondary | ICD-10-CM | POA: Insufficient documentation

## 2019-10-10 DIAGNOSIS — Z5111 Encounter for antineoplastic chemotherapy: Secondary | ICD-10-CM | POA: Diagnosis not present

## 2019-10-10 DIAGNOSIS — C7931 Secondary malignant neoplasm of brain: Secondary | ICD-10-CM | POA: Insufficient documentation

## 2019-10-10 DIAGNOSIS — J910 Malignant pleural effusion: Secondary | ICD-10-CM | POA: Diagnosis not present

## 2019-10-10 DIAGNOSIS — E079 Disorder of thyroid, unspecified: Secondary | ICD-10-CM

## 2019-10-10 DIAGNOSIS — C61 Malignant neoplasm of prostate: Secondary | ICD-10-CM | POA: Diagnosis not present

## 2019-10-10 LAB — CBC WITH DIFFERENTIAL (CANCER CENTER ONLY)
Abs Immature Granulocytes: 0.01 10*3/uL (ref 0.00–0.07)
Basophils Absolute: 0 10*3/uL (ref 0.0–0.1)
Basophils Relative: 1 %
Eosinophils Absolute: 0 10*3/uL (ref 0.0–0.5)
Eosinophils Relative: 1 %
HCT: 39.8 % (ref 39.0–52.0)
Hemoglobin: 13.4 g/dL (ref 13.0–17.0)
Immature Granulocytes: 0 %
Lymphocytes Relative: 16 %
Lymphs Abs: 0.7 10*3/uL (ref 0.7–4.0)
MCH: 32.1 pg (ref 26.0–34.0)
MCHC: 33.7 g/dL (ref 30.0–36.0)
MCV: 95.2 fL (ref 80.0–100.0)
Monocytes Absolute: 0.4 10*3/uL (ref 0.1–1.0)
Monocytes Relative: 8 %
Neutro Abs: 3.5 10*3/uL (ref 1.7–7.7)
Neutrophils Relative %: 74 %
Platelet Count: 228 10*3/uL (ref 150–400)
RBC: 4.18 MIL/uL — ABNORMAL LOW (ref 4.22–5.81)
RDW: 13.1 % (ref 11.5–15.5)
WBC Count: 4.6 10*3/uL (ref 4.0–10.5)
nRBC: 0 % (ref 0.0–0.2)

## 2019-10-10 LAB — CMP (CANCER CENTER ONLY)
ALT: 28 U/L (ref 0–44)
AST: 27 U/L (ref 15–41)
Albumin: 3.4 g/dL — ABNORMAL LOW (ref 3.5–5.0)
Alkaline Phosphatase: 73 U/L (ref 38–126)
Anion gap: 7 (ref 5–15)
BUN: 19 mg/dL (ref 8–23)
CO2: 26 mmol/L (ref 22–32)
Calcium: 8.8 mg/dL — ABNORMAL LOW (ref 8.9–10.3)
Chloride: 109 mmol/L (ref 98–111)
Creatinine: 1.04 mg/dL (ref 0.61–1.24)
GFR, Est AFR Am: >60 mL/min (ref >60)
GFR, Estimated: >60 mL/min (ref >60)
Glucose, Bld: 93 mg/dL (ref 70–99)
Potassium: 4.3 mmol/L (ref 3.5–5.1)
Sodium: 142 mmol/L (ref 135–145)
Total Bilirubin: 0.3 mg/dL (ref 0.3–1.2)
Total Protein: 6.5 g/dL (ref 6.5–8.1)

## 2019-10-10 LAB — TSH: TSH: 2.059 u[IU]/mL (ref 0.320–4.118)

## 2019-10-10 MED ORDER — SODIUM CHLORIDE 0.9% FLUSH
10.0000 mL | INTRAVENOUS | Status: DC | PRN
  Administered 2019-10-10: 10 mL
  Filled 2019-10-10: qty 10

## 2019-10-10 MED ORDER — SODIUM CHLORIDE 0.9 % IV SOLN
475.0000 mg/m2 | Freq: Once | INTRAVENOUS | Status: AC
  Administered 2019-10-10: 1000 mg via INTRAVENOUS
  Filled 2019-10-10: qty 40

## 2019-10-10 MED ORDER — SODIUM CHLORIDE 0.9 % IV SOLN
Freq: Once | INTRAVENOUS | Status: AC
  Filled 2019-10-10: qty 250

## 2019-10-10 MED ORDER — PROCHLORPERAZINE MALEATE 10 MG PO TABS: 10.0000 mg | ORAL_TABLET | Freq: Once | ORAL | Status: DC

## 2019-10-10 MED ORDER — SODIUM CHLORIDE 0.9 % IV SOLN
200.0000 mg | Freq: Once | INTRAVENOUS | Status: AC
  Administered 2019-10-10: 200 mg via INTRAVENOUS
  Filled 2019-10-10: qty 8

## 2019-10-10 MED ORDER — HEPARIN SOD (PORK) LOCK FLUSH 100 UNIT/ML IV SOLN
500.0000 [IU] | Freq: Once | INTRAVENOUS | Status: AC | PRN
  Administered 2019-10-10: 500 [IU]
  Filled 2019-10-10: qty 5

## 2019-10-10 NOTE — Progress Notes (Signed)
[facility] [phone]) [phone]   [phone]  OFFICE PROGRESS NOTE  Laurey Morale, MD [place] [address]  DIAGNOSIS: Stage IV (T2b, N2, M1c) presented with right middle lobe lung mass in addition to mediastinal lymphadenopathy as well as malignant right pleural effusion with pleural-based nodules and suspicious right hepatic lesion and the brain metastasis diagnosed in July 2020.  Molecular Biomarkers: [id number] (Exon 20 insertion) 0.2% Dacomitinib,Neratinib,Osimertinib  [id number] 0.1% None    PRIOR THERAPY: None  CURRENT THERAPY: Systemic chemotherapy with carboplatin for AUC of 5, Alimta 500 mg/M2 and Keytruda 200 mg IV every 3 weeks.  First dose April 04, 2019.  Status post 9 cycles  Starting from cycle #5 the patient is on maintenance treatment with Alimta and Keytruda every 3 weeks.  INTERVAL HISTORY: Ethan Brown 69 y.o. male returns to the clinic today for follow-up visit.  The patient is feeling fine today with no concerning complaints.  The patient denied having any current chest pain, shortness of breath, cough or hemoptysis.  He denied having any nausea, vomiting, diarrhea or constipation.  He has no headache or visual changes.  He denied having any recent weight loss or night sweats.  He had MRI of the brain performed recently that showed no evidence for progression in the brain.  The patient also has repeat CT scan of the chest, abdomen pelvis performed recently and he is here for evaluation and discussion of his discuss results.   MEDICAL HISTORY: Past Medical History:  Diagnosis Date  . Borderline hypertension   . Borderline systolic hypertension   . Brain metastasis ([facility]) dx'd 01/2019  . Detached vitreous humor, left   . Dyspnea   . ED (erectile dysfunction) of organic origin   . Hyperlipidemia   . Hypertension   . Lens subluxation, left   . Lung cancer ([facility]) dx'd 01/2019  . Migraine   .  Migraine headache    takes PRN Imitrex  . Pseudophakia   . TGA (transient global amnesia) 2017  . Transient global amnesia     ALLERGIES:  has No Known Allergies.  MEDICATIONS:  Current Outpatient Medications  Medication Sig Dispense Refill  . benzonatate (TESSALON) 200 MG capsule Take 1 capsule (200 mg total) by mouth 3 (three) times daily as needed for cough. 30 capsule 0  . cyanocobalamin (,VITAMIN B-12,) 1000 MCG/ML injection INJECT 1 ML (1,000 MCG TOTAL) INTO THE MUSCLE ONCE FOR 1 DOSE. 5 mL 1  . folic acid (FOLVITE) 1 MG tablet Take 1 tablet (1 mg total) by mouth daily. 90 tablet 1  . lidocaine-prilocaine (EMLA) cream Apply to the Port-A-Cath site 30-60 minutes before treatment 30 g 0  . prochlorperazine (COMPAZINE) 10 MG tablet Take 1 tablet (10 mg total) by mouth every 6 (six) hours as needed for nausea or vomiting. 30 tablet 0  . sildenafil (REVATIO) 20 MG tablet TAKE AS DIRECTED 10 tablet 11  . SUMAtriptan (IMITREX) 50 MG tablet Take 50 mg by mouth every 2 (two) hours as needed for migraine.     . traMADol (ULTRAM) 50 MG tablet Take 1 tablet (50 mg total) by mouth every 6 (six) hours as needed for severe pain. 20 tablet 0   No current facility-administered medications for this visit.    SURGICAL HISTORY:  Past Surgical History:  Procedure Laterality Date  . CAROTID DOPPLERS  09/2017   Mild non-obstructive disease  . CATARACT EXTRACTION, BILATERAL    .  cataract, left  2018  . CHEST TUBE INSERTION Right 03/29/2019   Procedure: INSERTION PLEURAL DRAINAGE CATHETER;  Surgeon: [name], MD;  Location: [facility];  Service: Thoracic;  Laterality: Right;  . COLONOSCOPY  2016   clear, repeat in 10 yrs   . Eye surgeries     , Lens attachment.  Vitrectomy  . FEMORAL HERNIA REPAIR    . HERNIA REPAIR    . IR THORACENTESIS ASP PLEURAL SPACE W/IMG GUIDE  02/22/2019  . IR THORACENTESIS ASP PLEURAL SPACE W/IMG GUIDE  03/13/2019  . LUMBAR LAMINECTOMY    . PLEURAL BIOPSY Right  03/29/2019   Procedure: PLEURAL BIOPSY;  Surgeon: [name], MD;  Location: [facility];  Service: Thoracic;  Laterality: Right;  . PORTACATH PLACEMENT N/A 03/29/2019   Procedure: INSERTION PORT-A-CATH;  Surgeon: [name], MD;  Location: [facility];  Service: Thoracic;  Laterality: N/A;  . retinal attachment, right    . sclearl buckle, right    . victrectomy,right    . VIDEO ASSISTED THORACOSCOPY Right 03/29/2019   Procedure: VIDEO ASSISTED THORACOSCOPY;  Surgeon: [name], MD;  Location: [facility];  Service: Thoracic;  Laterality: Right;    REVIEW OF SYSTEMS:  Constitutional: negative Eyes: negative Ears, nose, mouth, throat, and face: negative Respiratory: negative Cardiovascular: negative Gastrointestinal: negative Genitourinary:negative Integument/breast: negative Hematologic/lymphatic: negative Musculoskeletal:negative Neurological: negative Behavioral/Psych: negative Endocrine: negative Allergic/Immunologic: negative   PHYSICAL EXAMINATION: General appearance: alert, cooperative and no distress Head: Normocephalic, without obvious abnormality, atraumatic Neck: no adenopathy, no JVD, supple, symmetrical, trachea midline and thyroid not enlarged, symmetric, no tenderness/mass/nodules Lymph nodes: Cervical, supraclavicular, and axillary nodes normal. Resp: clear to auscultation bilaterally Back: symmetric, no curvature. ROM normal. No CVA tenderness. Cardio: regular rate and rhythm, S1, S2 normal, no murmur, click, rub or gallop GI: soft, non-tender; bowel sounds normal; no masses,  no organomegaly Extremities: extremities normal, atraumatic, no cyanosis or edema Neurologic: Alert and oriented X 3, normal strength and tone. Normal symmetric reflexes. Normal coordination and gait  ECOG PERFORMANCE STATUS: 1 - Symptomatic but completely ambulatory  Blood pressure 125/84, pulse 74, temperature 98.2 F (36.8 C), temperature source Temporal, resp. rate 17,  height '5\' 11"'  (1.803 m), weight 203 lb 11.2 oz (92.4 kg), SpO2 99 %.  LABORATORY DATA: Lab Results  Component Value Date   WBC 3.9 (L) 09/19/2019   HGB 13.1 09/19/2019   HCT 39.3 09/19/2019   MCV 97.8 09/19/2019   PLT 231 09/19/2019      Chemistry      Component Value Date/Time   NA 140 09/19/2019 0944   K 4.0 09/19/2019 0944   CL 107 09/19/2019 0944   CO2 26 09/19/2019 0944   BUN 23 09/19/2019 0944   CREATININE 1.01 09/19/2019 0944      Component Value Date/Time   CALCIUM 8.7 (L) 09/19/2019 0944   ALKPHOS 73 09/19/2019 0944   AST 24 09/19/2019 0944   ALT 24 09/19/2019 0944   BILITOT 0.3 09/19/2019 0944       RADIOGRAPHIC STUDIES: CT Chest W Contrast  Result Date: 10/06/2019 CLINICAL DATA:  Lung cancer, for restaging EXAM: CT CHEST, ABDOMEN, AND PELVIS WITH CONTRAST TECHNIQUE: Multidetector CT imaging of the chest, abdomen and pelvis was performed following the standard protocol during bolus administration of intravenous contrast. CONTRAST:  153m OMNIPAQUE IOHEXOL 300 MG/ML  SOLN COMPARISON:  08/07/2019 FINDINGS: CT CHEST FINDINGS Cardiovascular: Heart is normal in size. No pericardial effusion. No evidence of thoracic aortic aneurysm. Right chest  port terminates in the mid SVC. Mediastinum/Nodes: Small mediastinal lymph nodes, including a 10 mm short axis low right paratracheal node (series 2/image 28), previously 13 mm. No suspicious axillary lymphadenopathy. Visualized thyroid is unremarkable. Lungs/Pleura: Mild biapical pleural-parenchymal scarring. Left lung is otherwise clear. Small right pleural effusion, mildly irregular with pleural enhancement, grossly unchanged. 1.8 x 1.2 x 1.5 cm central right middle lobe nodule (series 4/image 81), grossly unchanged. Pleural studding along the right minor fissure (series 4/image 80), unchanged. Subpleural nodularity in the anterior right middle lobe measuring up to 5 mm (series 4/image 93), unchanged. 10 mm subpleural nodule in the  medial right middle lobe (series 4/image 119), previously 12 mm when measured in a similar fashion, grossly unchanged. Posterior right lower lobe nodular opacity (series 4/image 134), favoring rounded atelectasis, grossly unchanged. Musculoskeletal: Mild degenerative changes of the thoracic spine. CT ABDOMEN PELVIS FINDINGS Hepatobiliary: Liver is within normal limits. No suspicious/enhancing hepatic lesions. Gallbladder is unremarkable. No intrahepatic or extrahepatic ductal dilatation. Pancreas: Within normal limits. Spleen: Within normal limits. Adrenals/Urinary Tract: Adrenal glands are within normal limits. Kidneys are within normal limits. No hydronephrosis. Bladder is within normal limits. Stomach/Bowel: Stomach is within normal limits. No evidence of bowel obstruction. Normal appendix (series 2/image 97). Very mild left colonic diverticulosis, without evidence of diverticulitis. Vascular/Lymphatic: No evidence of abdominal aortic aneurysm. No suspicious abdominopelvic lymphadenopathy. Faint lymphatic stranding with small nodes in the jejunal mesentery measuring up to 5 mm short axis (series 2/image 88), unchanged. Reproductive: Prostatomegaly, with enlargement of the central gland, indenting the base of the bladder. Other: No abdominopelvic ascites. Postsurgical changes in the left inguinal region. Musculoskeletal: Mild degenerative changes of the lumbar spine. IMPRESSION: 1.8 cm central right middle lobe nodule, corresponding to the patient's known primary bronchogenic neoplasm, grossly unchanged. Associated pleural/subpleural metastases in the right hemithorax with associated small complex right pleural effusion, grossly unchanged. Small mediastinal lymph nodes, mildly improved. No evidence of metastatic disease in the abdomen/pelvis. Additional stable ancillary findings as above. Electronically Signed   By: Julian Hy M.D.   On: 10/06/2019 09:50   MR Brain W Wo Contrast  Result Date:  10/05/2019 CLINICAL DATA:  Brain metastasis. Follow-up from treatment; brain/CNS neoplasm, surveillance. EXAM: MRI HEAD WITHOUT AND WITH CONTRAST TECHNIQUE: Multiplanar, multiecho pulse sequences of the brain and surrounding structures were obtained without and with intravenous contrast. CONTRAST:  40m MULTIHANCE GADOBENATE DIMEGLUMINE 529 MG/ML IV SOLN COMPARISON:  Brain MRI 07/06/2019 FINDINGS: Brain: Continued subtle interval decrease in size and conspicuity of an enhancing metastasis within the right parietal lobe postcentral gyrus, now measuring 2 mm (previously 3 mm) (series 11, image 109) (series 12, image 17). Unchanged punctate SWI signal loss at this site. No new enhancing intracranial metastases are identified. There is no evidence of acute infarct. No midline shift or extra-axial fluid collection. Redemonstrated are 2 punctate foci of SWI signal loss within the left parietal lobe which may reflect chronic microhemorrhages or mineralization. Stable and minimal scattered T2/FLAIR hyperintensity within the cerebral white matter is nonspecific, but consistent with chronic small vessel ischemic disease. Cerebral volume is normal for age. Vascular: Flow voids maintained within the proximal large arterial vessels. Expected enhancement within the proximal large arterial vessels and dural venous sinuses. Skull and upper cervical spine: No focal marrow lesion. Sinuses/Orbits: Visualized orbits demonstrate no acute abnormality. Mild paranasal sinus mucosal thickening greatest within bilateral ethmoid air cells. No significant mastoid effusion. IMPRESSION: Continued subtle interval decrease in size and conspicuity of an enhancing metastasis within the  right parietal lobe, now 2 mm. No new intracranial metastases are identified. Electronically Signed   By: Kellie Simmering DO   On: 10/05/2019 14:20   CT Abdomen Pelvis W Contrast  Result Date: 10/06/2019 CLINICAL DATA:  Lung cancer, for restaging EXAM: CT CHEST,  ABDOMEN, AND PELVIS WITH CONTRAST TECHNIQUE: Multidetector CT imaging of the chest, abdomen and pelvis was performed following the standard protocol during bolus administration of intravenous contrast. CONTRAST:  173m OMNIPAQUE IOHEXOL 300 MG/ML  SOLN COMPARISON:  08/07/2019 FINDINGS: CT CHEST FINDINGS Cardiovascular: Heart is normal in size. No pericardial effusion. No evidence of thoracic aortic aneurysm. Right chest port terminates in the mid SVC. Mediastinum/Nodes: Small mediastinal lymph nodes, including a 10 mm short axis low right paratracheal node (series 2/image 28), previously 13 mm. No suspicious axillary lymphadenopathy. Visualized thyroid is unremarkable. Lungs/Pleura: Mild biapical pleural-parenchymal scarring. Left lung is otherwise clear. Small right pleural effusion, mildly irregular with pleural enhancement, grossly unchanged. 1.8 x 1.2 x 1.5 cm central right middle lobe nodule (series 4/image 81), grossly unchanged. Pleural studding along the right minor fissure (series 4/image 80), unchanged. Subpleural nodularity in the anterior right middle lobe measuring up to 5 mm (series 4/image 93), unchanged. 10 mm subpleural nodule in the medial right middle lobe (series 4/image 119), previously 12 mm when measured in a similar fashion, grossly unchanged. Posterior right lower lobe nodular opacity (series 4/image 134), favoring rounded atelectasis, grossly unchanged. Musculoskeletal: Mild degenerative changes of the thoracic spine. CT ABDOMEN PELVIS FINDINGS Hepatobiliary: Liver is within normal limits. No suspicious/enhancing hepatic lesions. Gallbladder is unremarkable. No intrahepatic or extrahepatic ductal dilatation. Pancreas: Within normal limits. Spleen: Within normal limits. Adrenals/Urinary Tract: Adrenal glands are within normal limits. Kidneys are within normal limits. No hydronephrosis. Bladder is within normal limits. Stomach/Bowel: Stomach is within normal limits. No evidence of bowel  obstruction. Normal appendix (series 2/image 97). Very mild left colonic diverticulosis, without evidence of diverticulitis. Vascular/Lymphatic: No evidence of abdominal aortic aneurysm. No suspicious abdominopelvic lymphadenopathy. Faint lymphatic stranding with small nodes in the jejunal mesentery measuring up to 5 mm short axis (series 2/image 88), unchanged. Reproductive: Prostatomegaly, with enlargement of the central gland, indenting the base of the bladder. Other: No abdominopelvic ascites. Postsurgical changes in the left inguinal region. Musculoskeletal: Mild degenerative changes of the lumbar spine. IMPRESSION: 1.8 cm central right middle lobe nodule, corresponding to the patient's known primary bronchogenic neoplasm, grossly unchanged. Associated pleural/subpleural metastases in the right hemithorax with associated small complex right pleural effusion, grossly unchanged. Small mediastinal lymph nodes, mildly improved. No evidence of metastatic disease in the abdomen/pelvis. Additional stable ancillary findings as above. Electronically Signed   By: SJulian HyM.D.   On: 10/06/2019 09:50    ASSESSMENT AND PLAN: This is a very pleasant 69years old white male recently diagnosed with a stage IV (T2b, N2, M1C) non-small cell lung cancer, adenocarcinoma with positive EGFR mutation in exon 20 (resistant mutation) diagnosed in July 2020 and presented with extensive disease involving the right upper lobe as well as the right middle and lower lobe with extensive pleural based metastasis as well as mediastinal and hilar disease with malignant pleural effusion as well as liver and brain metastasis. Unfortunately the patient has no actionable mutations based on the molecular studies by [employer].   The patient is currently undergoing systemic chemotherapy with carboplatin for AUC of 5, Alimta 500 mg/M2 and Keytruda 200 mg IV every 3 weeks status post 9 cycles.  Starting from cycle #5 the  patient is on  maintenance treatment with Alimta and Keytruda. He is tolerating this treatment well with no concerning adverse effects. The patient had repeat CT scan of the chest, abdomen pelvis performed recently.  I personally and independently reviewed the scans and discussed the results with the patient today. His scan showed no concerning findings for disease progression. I recommended for the patient to continue his current maintenance treatment with Alimta and Keytruda and he will proceed with cycle #10 today. For the prostate cancer, the patient will continue his routine follow-up visit by his urologist. The patient will come back for follow-up visit in 3 weeks for evaluation before the next cycle of his treatment. For the coronary atherosclerosis, he will continue his routine follow-up visit and evaluation by cardiology. He was advised to call immediately if he has any concerning symptoms in the interval. The patient voices understanding of current disease status and treatment options and is in agreement with the current care plan. All questions were answered. The patient knows to call the clinic with any problems, questions or concerns. We can certainly see the patient much sooner if necessary.  Disclaimer: This note was dictated with voice recognition software. Similar sounding words can inadvertently be transcribed and may not be corrected upon review.

## 2019-10-10 NOTE — Patient Instructions (Signed)
[facility] Discharge Instructions for Patients Receiving Chemotherapy  Today you received the following chemotherapy agents Pembrolizumab (KEYTRUDA) & Pemetrexed (ALIMTA).  To help prevent nausea and vomiting after your treatment, we encourage you to take your nausea medication as prescribed.   If you develop nausea and vomiting that is not controlled by your nausea medication, call the clinic.   BELOW ARE SYMPTOMS THAT SHOULD BE REPORTED IMMEDIATELY:  *FEVER GREATER THAN 100.5 F  *CHILLS WITH OR WITHOUT FEVER  NAUSEA AND VOMITING THAT IS NOT CONTROLLED WITH YOUR NAUSEA MEDICATION  *UNUSUAL SHORTNESS OF BREATH  *UNUSUAL BRUISING OR BLEEDING  TENDERNESS IN MOUTH AND THROAT WITH OR WITHOUT PRESENCE OF ULCERS  *URINARY PROBLEMS  *BOWEL PROBLEMS  UNUSUAL RASH Items with * indicate a potential emergency and should be followed up as soon as possible.  Feel free to call the clinic should you have any questions or concerns. The clinic phone number is [phone].  Please show the [facility] at check-in to the Emergency Department and triage nurse.  Coronavirus (COVID-19) Are you at risk?  Are you at risk for the Coronavirus (COVID-19)?  To be considered HIGH RISK for Coronavirus (COVID-19), you have to meet the following criteria:  . Traveled to [place], [place], [place], [place] or [place]; or in the [place] to [place], [place], [place], or [place]; and have fever, cough, and shortness of breath within the last 2 weeks of travel OR . Been in close contact with a person diagnosed with COVID-19 within the last 2 weeks and have fever, cough, and shortness of breath . IF YOU DO NOT MEET THESE CRITERIA, YOU ARE CONSIDERED LOW RISK FOR COVID-19.  What to do if you are HIGH RISK for COVID-19?  Marland Kitchen If you are having a medical emergency, call 911. . Seek medical care right away. Before you go to a doctor's office, urgent care or emergency department,  call ahead and tell them about your recent travel, contact with someone diagnosed with COVID-19, and your symptoms. You should receive instructions from your physician's office regarding next steps of care.  . When you arrive at healthcare provider, tell the healthcare staff immediately you have returned from visiting [place], [place], [place], [place] or [place]; or traveled in the [place] to [place], [place], [place], or [place]; in the last two weeks or you have been in close contact with a person diagnosed with COVID-19 in the last 2 weeks.   . Tell the health care staff about your symptoms: fever, cough and shortness of breath. . After you have been seen by a medical provider, you will be either: o Tested for (COVID-19) and discharged home on quarantine except to seek medical care if symptoms worsen, and asked to  - Stay home and avoid contact with others until you get your results (4-5 days)  - Avoid travel on public transportation if possible (such as bus, train, or airplane) or o Sent to the Emergency Department by EMS for evaluation, COVID-19 testing, and possible admission depending on your condition and test results.  What to do if you are LOW RISK for COVID-19?  Reduce your risk of any infection by using the same precautions used for avoiding the common cold or flu:  Marland Kitchen Wash your hands often with soap and warm water for at least 20 seconds.  If soap and water are not readily available, use an alcohol-based hand sanitizer with at least 60% alcohol.  Marland Kitchen  If coughing or sneezing, cover your mouth and nose by coughing or sneezing into the elbow areas of your shirt or coat, into a tissue or into your sleeve (not your hands). . Avoid shaking hands with others and consider head nods or verbal greetings only. . Avoid touching your eyes, nose, or mouth with unwashed hands.  . Avoid close contact with people who are sick. . Avoid places or events with large numbers of people in one  location, like concerts or sporting events. . Carefully consider travel plans you have or are making. . If you are planning any travel outside or inside the [place], visit the CDC's Travelers' Health webpage for the latest health notices. . If you have some symptoms but not all symptoms, continue to monitor at home and seek medical attention if your symptoms worsen. . If you are having a medical emergency, call 911.   [place] / e-Visit: [email or web address]         [facility]: [facility]: [phone]                   [facility]: [phone]

## 2019-10-11 ENCOUNTER — Ambulatory Visit
Admission: RE | Admit: 2019-10-11 | Discharge: 2019-10-11 | Disposition: A | Discharge status: Home / Self Care | Payer: Medicare Other | Source: Ambulatory Visit | Attending: Urology | Admitting: Urology

## 2019-10-11 ENCOUNTER — Other Ambulatory Visit: Payer: Self-pay

## 2019-10-11 ENCOUNTER — Encounter: Payer: Self-pay | Admitting: Urology

## 2019-10-11 ENCOUNTER — Encounter: Payer: Medicare Other | Admitting: Family Medicine

## 2019-10-11 DIAGNOSIS — Z85118 Personal history of other malignant neoplasm of bronchus and lung: Secondary | ICD-10-CM | POA: Diagnosis not present

## 2019-10-11 DIAGNOSIS — C7931 Secondary malignant neoplasm of brain: Secondary | ICD-10-CM

## 2019-10-11 DIAGNOSIS — Z08 Encounter for follow-up examination after completed treatment for malignant neoplasm: Secondary | ICD-10-CM | POA: Diagnosis not present

## 2019-10-11 NOTE — Progress Notes (Signed)
Radiation Oncology         [phone] ________________________________  Name: Ethan Brown MRN: [id number]  Date: 10/11/2019  DOB: 1950/11/25  Post Treatment Note  CC: Laurey Morale, MD  Curt Bears, MD  Diagnosis:   69 yo man with a solitary 6.2 mm right parietal brain metastasis from Stage IV (T2b, N2, M1c) adenocarcinoma of the right middle lobe of the lung.     Interval Since Last Radiation:  6 months   04/07/19: SRS to a 6 mm  right parietal target treated to a prescription dose of 20 Gy in a single fraction.    Narrative:  I spoke with the patient to conduct his routine scheduled 3 month follow up visit via telephone to spare the patient unnecessary potential exposure in the healthcare setting during the current COVID-19 pandemic.  The patient was notified in advance and gave permission to proceed with this visit format.   He has recovered well from the effects of his recent radiotherapy and remains without complaints.  His visit today is to discuss the results from his follow-up MRI brain scan performed on 10/05/19 which shows a continued subtle interval decrease in size of the treated right parietal lesion and no evidence of new metastases.  He was initially on systemic chemotherapy with carboplatin, Alimta and Keytruda every 3 weeks under the care and direction of Dr. Earlie Server and completed 4 cycles.  His restaging imaging showed a good response to treatment with a decrease in the right pleural effusion, decreased mediastinal and right hilar lymphadenopathy, and improvement in the liver lesion and right middle lobe pulmonary lesion so he was transitioned to maintenance therapy with Alimta and Keytruda, beginning with cycle 5 of his treatment on 06/27/2019 which he continues to tolerate well.  His recent restaging imaging with CT C/A/P from 10/06/19 showed continued disease stability so he proceeded with cycle #10 of maintenance treatment on 10/10/19.               On review of  systems, the patient states that he is doing very well overall.  He is currently without complaints and specifically denies headaches, changes in auditory or visual acuity, nausea, vomiting, dizziness, imbalance, tremors or seizure activity.  He has not had recent fevers, chills or night sweats.  He is tolerating his systemic therapy quite well and is overall very pleased with his progress to date.  ALLERGIES:  has No Known Allergies.  Meds: Current Outpatient Medications  Medication Sig Dispense Refill  . benzonatate (TESSALON) 200 MG capsule Take 1 capsule (200 mg total) by mouth 3 (three) times daily as needed for cough. 30 capsule 0  . cyanocobalamin (,VITAMIN B-12,) 1000 MCG/ML injection INJECT 1 ML (1,000 MCG TOTAL) INTO THE MUSCLE ONCE FOR 1 DOSE. 5 mL 1  . folic acid (FOLVITE) 1 MG tablet Take 1 tablet (1 mg total) by mouth daily. 90 tablet 1  . lidocaine-prilocaine (EMLA) cream Apply to the Port-A-Cath site 30-60 minutes before treatment 30 g 0  . prochlorperazine (COMPAZINE) 10 MG tablet Take 1 tablet (10 mg total) by mouth every 6 (six) hours as needed for nausea or vomiting. 30 tablet 0  . sildenafil (REVATIO) 20 MG tablet TAKE AS DIRECTED 10 tablet 11  . SUMAtriptan (IMITREX) 50 MG tablet Take 50 mg by mouth every 2 (two) hours as needed for migraine.     . traMADol (ULTRAM) 50 MG tablet Take 1 tablet (50 mg total) by mouth every 6 (six) hours  as needed for severe pain. 20 tablet 0   No current facility-administered medications for this encounter.    Physical Findings: Unable to assess due to telephone follow-up visit format.  Lab Findings: Lab Results  Component Value Date   WBC 4.6 10/10/2019   HGB 13.4 10/10/2019   HCT 39.8 10/10/2019   MCV 95.2 10/10/2019   PLT 228 10/10/2019     Radiographic Findings: CT Chest W Contrast  Result Date: 10/06/2019 CLINICAL DATA:  Lung cancer, for restaging EXAM: CT CHEST, ABDOMEN, AND PELVIS WITH CONTRAST TECHNIQUE: Multidetector  CT imaging of the chest, abdomen and pelvis was performed following the standard protocol during bolus administration of intravenous contrast. CONTRAST:  13mL OMNIPAQUE IOHEXOL 300 MG/ML  SOLN COMPARISON:  08/07/2019 FINDINGS: CT CHEST FINDINGS Cardiovascular: Heart is normal in size. No pericardial effusion. No evidence of thoracic aortic aneurysm. Right chest port terminates in the mid SVC. Mediastinum/Nodes: Small mediastinal lymph nodes, including a 10 mm short axis low right paratracheal node (series 2/image 28), previously 13 mm. No suspicious axillary lymphadenopathy. Visualized thyroid is unremarkable. Lungs/Pleura: Mild biapical pleural-parenchymal scarring. Left lung is otherwise clear. Small right pleural effusion, mildly irregular with pleural enhancement, grossly unchanged. 1.8 x 1.2 x 1.5 cm central right middle lobe nodule (series 4/image 81), grossly unchanged. Pleural studding along the right minor fissure (series 4/image 80), unchanged. Subpleural nodularity in the anterior right middle lobe measuring up to 5 mm (series 4/image 93), unchanged. 10 mm subpleural nodule in the medial right middle lobe (series 4/image 119), previously 12 mm when measured in a similar fashion, grossly unchanged. Posterior right lower lobe nodular opacity (series 4/image 134), favoring rounded atelectasis, grossly unchanged. Musculoskeletal: Mild degenerative changes of the thoracic spine. CT ABDOMEN PELVIS FINDINGS Hepatobiliary: Liver is within normal limits. No suspicious/enhancing hepatic lesions. Gallbladder is unremarkable. No intrahepatic or extrahepatic ductal dilatation. Pancreas: Within normal limits. Spleen: Within normal limits. Adrenals/Urinary Tract: Adrenal glands are within normal limits. Kidneys are within normal limits. No hydronephrosis. Bladder is within normal limits. Stomach/Bowel: Stomach is within normal limits. No evidence of bowel obstruction. Normal appendix (series 2/image 97). Very mild left  colonic diverticulosis, without evidence of diverticulitis. Vascular/Lymphatic: No evidence of abdominal aortic aneurysm. No suspicious abdominopelvic lymphadenopathy. Faint lymphatic stranding with small nodes in the jejunal mesentery measuring up to 5 mm short axis (series 2/image 88), unchanged. Reproductive: Prostatomegaly, with enlargement of the central gland, indenting the base of the bladder. Other: No abdominopelvic ascites. Postsurgical changes in the left inguinal region. Musculoskeletal: Mild degenerative changes of the lumbar spine. IMPRESSION: 1.8 cm central right middle lobe nodule, corresponding to the patient's known primary bronchogenic neoplasm, grossly unchanged. Associated pleural/subpleural metastases in the right hemithorax with associated small complex right pleural effusion, grossly unchanged. Small mediastinal lymph nodes, mildly improved. No evidence of metastatic disease in the abdomen/pelvis. Additional stable ancillary findings as above. Electronically Signed   By: Julian Hy M.D.   On: 10/06/2019 09:50   MR Brain W Wo Contrast  Result Date: 10/05/2019 CLINICAL DATA:  Brain metastasis. Follow-up from treatment; brain/CNS neoplasm, surveillance. EXAM: MRI HEAD WITHOUT AND WITH CONTRAST TECHNIQUE: Multiplanar, multiecho pulse sequences of the brain and surrounding structures were obtained without and with intravenous contrast. CONTRAST:  25mL MULTIHANCE GADOBENATE DIMEGLUMINE 529 MG/ML IV SOLN COMPARISON:  Brain MRI 07/06/2019 FINDINGS: Brain: Continued subtle interval decrease in size and conspicuity of an enhancing metastasis within the right parietal lobe postcentral gyrus, now measuring 2 mm (previously 3 mm) (series 11, image 109) (  series 12, image 17). Unchanged punctate SWI signal loss at this site. No new enhancing intracranial metastases are identified. There is no evidence of acute infarct. No midline shift or extra-axial fluid collection. Redemonstrated are 2  punctate foci of SWI signal loss within the left parietal lobe which may reflect chronic microhemorrhages or mineralization. Stable and minimal scattered T2/FLAIR hyperintensity within the cerebral white matter is nonspecific, but consistent with chronic small vessel ischemic disease. Cerebral volume is normal for age. Vascular: Flow voids maintained within the proximal large arterial vessels. Expected enhancement within the proximal large arterial vessels and dural venous sinuses. Skull and upper cervical spine: No focal marrow lesion. Sinuses/Orbits: Visualized orbits demonstrate no acute abnormality. Mild paranasal sinus mucosal thickening greatest within bilateral ethmoid air cells. No significant mastoid effusion. IMPRESSION: Continued subtle interval decrease in size and conspicuity of an enhancing metastasis within the right parietal lobe, now 2 mm. No new intracranial metastases are identified. Electronically Signed   By: Kellie Simmering DO   On: 10/05/2019 14:20   CT Abdomen Pelvis W Contrast  Result Date: 10/06/2019 CLINICAL DATA:  Lung cancer, for restaging EXAM: CT CHEST, ABDOMEN, AND PELVIS WITH CONTRAST TECHNIQUE: Multidetector CT imaging of the chest, abdomen and pelvis was performed following the standard protocol during bolus administration of intravenous contrast. CONTRAST:  130mL OMNIPAQUE IOHEXOL 300 MG/ML  SOLN COMPARISON:  08/07/2019 FINDINGS: CT CHEST FINDINGS Cardiovascular: Heart is normal in size. No pericardial effusion. No evidence of thoracic aortic aneurysm. Right chest port terminates in the mid SVC. Mediastinum/Nodes: Small mediastinal lymph nodes, including a 10 mm short axis low right paratracheal node (series 2/image 28), previously 13 mm. No suspicious axillary lymphadenopathy. Visualized thyroid is unremarkable. Lungs/Pleura: Mild biapical pleural-parenchymal scarring. Left lung is otherwise clear. Small right pleural effusion, mildly irregular with pleural enhancement, grossly  unchanged. 1.8 x 1.2 x 1.5 cm central right middle lobe nodule (series 4/image 81), grossly unchanged. Pleural studding along the right minor fissure (series 4/image 80), unchanged. Subpleural nodularity in the anterior right middle lobe measuring up to 5 mm (series 4/image 93), unchanged. 10 mm subpleural nodule in the medial right middle lobe (series 4/image 119), previously 12 mm when measured in a similar fashion, grossly unchanged. Posterior right lower lobe nodular opacity (series 4/image 134), favoring rounded atelectasis, grossly unchanged. Musculoskeletal: Mild degenerative changes of the thoracic spine. CT ABDOMEN PELVIS FINDINGS Hepatobiliary: Liver is within normal limits. No suspicious/enhancing hepatic lesions. Gallbladder is unremarkable. No intrahepatic or extrahepatic ductal dilatation. Pancreas: Within normal limits. Spleen: Within normal limits. Adrenals/Urinary Tract: Adrenal glands are within normal limits. Kidneys are within normal limits. No hydronephrosis. Bladder is within normal limits. Stomach/Bowel: Stomach is within normal limits. No evidence of bowel obstruction. Normal appendix (series 2/image 97). Very mild left colonic diverticulosis, without evidence of diverticulitis. Vascular/Lymphatic: No evidence of abdominal aortic aneurysm. No suspicious abdominopelvic lymphadenopathy. Faint lymphatic stranding with small nodes in the jejunal mesentery measuring up to 5 mm short axis (series 2/image 88), unchanged. Reproductive: Prostatomegaly, with enlargement of the central gland, indenting the base of the bladder. Other: No abdominopelvic ascites. Postsurgical changes in the left inguinal region. Musculoskeletal: Mild degenerative changes of the lumbar spine. IMPRESSION: 1.8 cm central right middle lobe nodule, corresponding to the patient's known primary bronchogenic neoplasm, grossly unchanged. Associated pleural/subpleural metastases in the right hemithorax with associated small complex  right pleural effusion, grossly unchanged. Small mediastinal lymph nodes, mildly improved. No evidence of metastatic disease in the abdomen/pelvis. Additional stable ancillary findings as above.  Electronically Signed   By: Julian Hy M.D.   On: 10/06/2019 09:50    Impression/Plan: 88. 69 yo man with a solitary 6.2 mm right parietal brain metastasis from Stage IV (T2b, N2, M1c) adenocarcinoma of the right middle lobe of the lung.    The patient appears to have recovered well from the effects of his recent [facility] treatment.  He is currently without complaints.  We discussed the plan to proceed with serial MRI brain scans every 3 months going forward for the first 2 years and then on a six-month basis thereafter.  His case and imaging will be reviewed in the multidisciplinary brain tumor board conferences and we will follow-up thereafter to review results and recommendations.  He appears to have a good understanding and is comfortable and in agreement with the stated plan. He knows to call at anytime in the interim with any questions or concerns related to his previous radiotherapy.  Given current concerns for patient exposure during the COVID-19 pandemic, this encounter was conducted via [facility] virtual meeting to allow for face to face communication. The patient was notified in advance and has given verbal consent for this type of encounter. The time spent during this encounter was 20 minutes with 50% of that time spent in counseling and/or coordination of care. The attendants for this meeting include Dustin Bumbaugh PA-C and patient, Bo Teicher. During the encounter, Sharryn Belding PA-C, was located at [facility] Radiation Oncology Department.  Patient, Ethan Brown was located at home.   Nicholos Johns, PA-C

## 2019-10-19 ENCOUNTER — Other Ambulatory Visit: Payer: Self-pay | Admitting: Family Medicine

## 2019-10-24 ENCOUNTER — Ambulatory Visit: Payer: Medicare Other | Attending: Internal Medicine

## 2019-10-24 DIAGNOSIS — Z23 Encounter for immunization: Secondary | ICD-10-CM

## 2019-10-24 NOTE — Progress Notes (Signed)
   Covid-19 Vaccination Clinic  Name:  Ethan Brown    MRN: [id number] DOB: 12-May-1951  10/24/2019  [name] was observed post Covid-19 immunization for 15 minutes without incident. He was provided with Vaccine Information Sheet and instruction to access the V-Safe system.   [name] was instructed to call 911 with any severe reactions post vaccine: Marland Kitchen Difficulty breathing  . Swelling of face and throat  . A fast heartbeat  . A bad rash all over body  . Dizziness and weakness   Immunizations Administered    Name Date Dose VIS Date Route   Pfizer COVID-19 Vaccine 10/24/2019  4:04 PM 0.3 mL 08/04/2019 Intramuscular   Manufacturer: [place]   Lot: [id number]   [place]: [id number]

## 2019-10-31 ENCOUNTER — Inpatient Hospital Stay: Payer: Medicare Other

## 2019-10-31 ENCOUNTER — Inpatient Hospital Stay: Payer: Medicare Other | Attending: Internal Medicine | Admitting: Internal Medicine

## 2019-10-31 ENCOUNTER — Encounter: Payer: Self-pay | Admitting: Internal Medicine

## 2019-10-31 ENCOUNTER — Other Ambulatory Visit: Payer: Self-pay

## 2019-10-31 VITALS — BP 131/82 | HR 77 | Temp 98.5°F | Resp 18 | Ht 71.0 in | Wt 202.0 lb

## 2019-10-31 DIAGNOSIS — C3491 Malignant neoplasm of unspecified part of right bronchus or lung: Secondary | ICD-10-CM

## 2019-10-31 DIAGNOSIS — C7801 Secondary malignant neoplasm of right lung: Secondary | ICD-10-CM | POA: Insufficient documentation

## 2019-10-31 DIAGNOSIS — C3411 Malignant neoplasm of upper lobe, right bronchus or lung: Secondary | ICD-10-CM | POA: Diagnosis not present

## 2019-10-31 DIAGNOSIS — Z5111 Encounter for antineoplastic chemotherapy: Secondary | ICD-10-CM | POA: Insufficient documentation

## 2019-10-31 DIAGNOSIS — C781 Secondary malignant neoplasm of mediastinum: Secondary | ICD-10-CM | POA: Diagnosis not present

## 2019-10-31 DIAGNOSIS — C61 Malignant neoplasm of prostate: Secondary | ICD-10-CM | POA: Diagnosis not present

## 2019-10-31 DIAGNOSIS — Z5112 Encounter for antineoplastic immunotherapy: Secondary | ICD-10-CM | POA: Diagnosis not present

## 2019-10-31 DIAGNOSIS — E079 Disorder of thyroid, unspecified: Secondary | ICD-10-CM

## 2019-10-31 DIAGNOSIS — C787 Secondary malignant neoplasm of liver and intrahepatic bile duct: Secondary | ICD-10-CM | POA: Diagnosis not present

## 2019-10-31 DIAGNOSIS — Z95828 Presence of other vascular implants and grafts: Secondary | ICD-10-CM

## 2019-10-31 DIAGNOSIS — I2510 Atherosclerotic heart disease of native coronary artery without angina pectoris: Secondary | ICD-10-CM | POA: Diagnosis not present

## 2019-10-31 DIAGNOSIS — C7931 Secondary malignant neoplasm of brain: Secondary | ICD-10-CM | POA: Diagnosis not present

## 2019-10-31 DIAGNOSIS — C782 Secondary malignant neoplasm of pleura: Secondary | ICD-10-CM | POA: Diagnosis not present

## 2019-10-31 DIAGNOSIS — Z79899 Other long term (current) drug therapy: Secondary | ICD-10-CM | POA: Diagnosis not present

## 2019-10-31 LAB — CBC WITH DIFFERENTIAL (CANCER CENTER ONLY)
Abs Immature Granulocytes: 0.01 10*3/uL (ref 0.00–0.07)
Basophils Absolute: 0 10*3/uL (ref 0.0–0.1)
Basophils Relative: 1 %
Eosinophils Absolute: 0.1 10*3/uL (ref 0.0–0.5)
Eosinophils Relative: 1 %
HCT: 41.3 % (ref 39.0–52.0)
Hemoglobin: 13.8 g/dL (ref 13.0–17.0)
Immature Granulocytes: 0 %
Lymphocytes Relative: 17 %
Lymphs Abs: 0.9 10*3/uL (ref 0.7–4.0)
MCH: 32.5 pg (ref 26.0–34.0)
MCHC: 33.4 g/dL (ref 30.0–36.0)
MCV: 97.4 fL (ref 80.0–100.0)
Monocytes Absolute: 0.4 10*3/uL (ref 0.1–1.0)
Monocytes Relative: 7 %
Neutro Abs: 3.9 10*3/uL (ref 1.7–7.7)
Neutrophils Relative %: 74 %
Platelet Count: 238 10*3/uL (ref 150–400)
RBC: 4.24 MIL/uL (ref 4.22–5.81)
RDW: 13.4 % (ref 11.5–15.5)
WBC Count: 5.3 10*3/uL (ref 4.0–10.5)
nRBC: 0 % (ref 0.0–0.2)

## 2019-10-31 LAB — CMP (CANCER CENTER ONLY)
ALT: 29 U/L (ref 0–44)
AST: 28 U/L (ref 15–41)
Albumin: 3.5 g/dL (ref 3.5–5.0)
Alkaline Phosphatase: 69 U/L (ref 38–126)
Anion gap: 7 (ref 5–15)
BUN: 24 mg/dL — ABNORMAL HIGH (ref 8–23)
CO2: 26 mmol/L (ref 22–32)
Calcium: 8.8 mg/dL — ABNORMAL LOW (ref 8.9–10.3)
Chloride: 107 mmol/L (ref 98–111)
Creatinine: 1.02 mg/dL (ref 0.61–1.24)
GFR, Est AFR Am: >60 mL/min (ref >60)
GFR, Estimated: >60 mL/min (ref >60)
Glucose, Bld: 98 mg/dL (ref 70–99)
Potassium: 4.3 mmol/L (ref 3.5–5.1)
Sodium: 140 mmol/L (ref 135–145)
Total Bilirubin: 0.4 mg/dL (ref 0.3–1.2)
Total Protein: 6.6 g/dL (ref 6.5–8.1)

## 2019-10-31 LAB — TSH: TSH: 1.108 u[IU]/mL (ref 0.320–4.118)

## 2019-10-31 MED ORDER — SODIUM CHLORIDE 0.9% FLUSH
10.0000 mL | INTRAVENOUS | Status: DC | PRN
  Administered 2019-10-31: 10 mL
  Filled 2019-10-31: qty 10

## 2019-10-31 MED ORDER — PROCHLORPERAZINE MALEATE 10 MG PO TABS: 10.0000 mg | ORAL_TABLET | Freq: Once | ORAL | Status: DC

## 2019-10-31 MED ORDER — SODIUM CHLORIDE 0.9 % IV SOLN
475.0000 mg/m2 | Freq: Once | INTRAVENOUS | Status: AC
  Administered 2019-10-31: 1000 mg via INTRAVENOUS
  Filled 2019-10-31: qty 40

## 2019-10-31 MED ORDER — SODIUM CHLORIDE 0.9% FLUSH
10.0000 mL | INTRAVENOUS | Status: DC | PRN
  Administered 2019-10-31: 10:00:00 10 mL
  Filled 2019-10-31: qty 10

## 2019-10-31 MED ORDER — SODIUM CHLORIDE 0.9 % IV SOLN
Freq: Once | INTRAVENOUS | Status: AC
  Filled 2019-10-31: qty 250

## 2019-10-31 MED ORDER — SODIUM CHLORIDE 0.9 % IV SOLN
200.0000 mg | Freq: Once | INTRAVENOUS | Status: AC
  Administered 2019-10-31: 200 mg via INTRAVENOUS
  Filled 2019-10-31: qty 8

## 2019-10-31 MED ORDER — PROCHLORPERAZINE MALEATE 10 MG PO TABS
ORAL_TABLET | ORAL | Status: AC
  Filled 2019-10-31: qty 1

## 2019-10-31 MED ORDER — HEPARIN SOD (PORK) LOCK FLUSH 100 UNIT/ML IV SOLN
500.0000 [IU] | Freq: Once | INTRAVENOUS | Status: AC | PRN
  Administered 2019-10-31: 500 [IU]
  Filled 2019-10-31: qty 5

## 2019-10-31 NOTE — Patient Instructions (Signed)

## 2019-10-31 NOTE — Progress Notes (Signed)
[place] [phone]   [phone]  OFFICE PROGRESS NOTE  Laurey Morale, MD [place] [address]  DIAGNOSIS: Stage IV (T2b, N2, M1c) presented with right middle lobe lung mass in addition to mediastinal lymphadenopathy as well as malignant right pleural effusion with pleural-based nodules and suspicious right hepatic lesion and the brain metastasis diagnosed in July 2020.  Molecular Biomarkers: [id number] (Exon 20 insertion) 0.2% Dacomitinib,Neratinib,Osimertinib  [id number] 0.1% None    PRIOR THERAPY: None  CURRENT THERAPY: Systemic chemotherapy with carboplatin for AUC of 5, Alimta 500 mg/M2 and Keytruda 200 mg IV every 3 weeks.  First dose April 04, 2019.  Status post 10 cycles  Starting from cycle #5 the patient is on maintenance treatment with Alimta and Keytruda every 3 weeks.  INTERVAL HISTORY: Ethan Brown 69 y.o. male returns to the clinic today for follow-up visit.  The patient is feeling fine today with no concerning complaints except for mild swelling of the hands and feet.  He eats a lot of salt unintentionally.  He denied having any current chest pain, shortness of breath, cough or hemoptysis.  He denied having any fever or chills.  He has no nausea, vomiting, diarrhea or constipation.  He has no headache or visual changes.  He is here today for evaluation before starting cycle #11.  MEDICAL HISTORY: Past Medical History:  Diagnosis Date   Borderline hypertension    Borderline systolic hypertension    Brain metastasis ([place]) dx'd 01/2019   Detached vitreous humor, left    Dyspnea    ED (erectile dysfunction) of organic origin    Hyperlipidemia    Hypertension    Lens subluxation, left    Lung cancer ([place]) dx'd 01/2019   Migraine    Migraine headache    takes PRN Imitrex   Pseudophakia    TGA (transient global amnesia) 2017   Transient global amnesia     ALLERGIES:   has No Known Allergies.  MEDICATIONS:  Current Outpatient Medications  Medication Sig Dispense Refill   benzonatate (TESSALON) 200 MG capsule Take 1 capsule (200 mg total) by mouth 3 (three) times daily as needed for cough. 30 capsule 0   cyanocobalamin (,VITAMIN B-12,) 1000 MCG/ML injection INJECT 1 ML (1,000 MCG TOTAL) INTO THE MUSCLE ONCE FOR 1 DOSE. 5 mL 1   folic acid (FOLVITE) 1 MG tablet Take 1 tablet (1 mg total) by mouth daily. 90 tablet 1   lidocaine-prilocaine (EMLA) cream Apply to the Port-A-Cath site 30-60 minutes before treatment 30 g 0   prochlorperazine (COMPAZINE) 10 MG tablet Take 1 tablet (10 mg total) by mouth every 6 (six) hours as needed for nausea or vomiting. 30 tablet 0   sildenafil (REVATIO) 20 MG tablet TAKE AS DIRECTED 10 tablet 11   SUMAtriptan (IMITREX) 50 MG tablet Take 50 mg by mouth every 2 (two) hours as needed for migraine.      traMADol (ULTRAM) 50 MG tablet Take 1 tablet (50 mg total) by mouth every 6 (six) hours as needed for severe pain. 20 tablet 0   No current facility-administered medications for this visit.    SURGICAL HISTORY:  Past Surgical History:  Procedure Laterality Date   CAROTID DOPPLERS  09/2017   Mild non-obstructive disease   CATARACT EXTRACTION, BILATERAL     cataract, left  2018   CHEST TUBE INSERTION Right 03/29/2019   Procedure: INSERTION PLEURAL DRAINAGE CATHETER;  Surgeon: [name],  MD;  Location: [facility];  Service: Thoracic;  Laterality: Right;   COLONOSCOPY  2016   clear, repeat in 10 yrs    Eye surgeries     , Lens attachment.  Vitrectomy   FEMORAL HERNIA REPAIR     HERNIA REPAIR     IR THORACENTESIS ASP PLEURAL SPACE W/IMG GUIDE  02/22/2019   IR THORACENTESIS ASP PLEURAL SPACE W/IMG GUIDE  03/13/2019   LUMBAR LAMINECTOMY     PLEURAL BIOPSY Right 03/29/2019   Procedure: PLEURAL BIOPSY;  Surgeon: [name], MD;  Location: [facility];  Service: Thoracic;  Laterality: Right;   PORTACATH  PLACEMENT N/A 03/29/2019   Procedure: INSERTION PORT-A-CATH;  Surgeon: [name], MD;  Location: [facility];  Service: Thoracic;  Laterality: N/A;   retinal attachment, right     sclearl buckle, right     victrectomy,right     VIDEO ASSISTED THORACOSCOPY Right 03/29/2019   Procedure: VIDEO ASSISTED THORACOSCOPY;  Surgeon: [name], MD;  Location: [facility] OR;  Service: Thoracic;  Laterality: Right;    REVIEW OF SYSTEMS:  A comprehensive review of systems was negative.   PHYSICAL EXAMINATION: General appearance: alert, cooperative and no distress Head: Normocephalic, without obvious abnormality, atraumatic Neck: no adenopathy, no JVD, supple, symmetrical, trachea midline and thyroid not enlarged, symmetric, no tenderness/mass/nodules Lymph nodes: Cervical, supraclavicular, and axillary nodes normal. Resp: clear to auscultation bilaterally Back: symmetric, no curvature. ROM normal. No CVA tenderness. Cardio: regular rate and rhythm, S1, S2 normal, no murmur, click, rub or gallop GI: soft, non-tender; bowel sounds normal; no masses,  no organomegaly Extremities: extremities normal, atraumatic, no cyanosis or edema  ECOG PERFORMANCE STATUS: 1 - Symptomatic but completely ambulatory  Blood pressure 131/82, pulse 77, temperature 98.5 F (36.9 C), temperature source Temporal, resp. rate 18, height 5' 11"  (1.803 m), weight 202 lb (91.6 kg).  LABORATORY DATA: Lab Results  Component Value Date   WBC 5.3 10/31/2019   HGB 13.8 10/31/2019   HCT 41.3 10/31/2019   MCV 97.4 10/31/2019   PLT 238 10/31/2019      Chemistry      Component Value Date/Time   NA 142 10/10/2019 0918   K 4.3 10/10/2019 0918   CL 109 10/10/2019 0918   CO2 26 10/10/2019 0918   BUN 19 10/10/2019 0918   CREATININE 1.04 10/10/2019 0918      Component Value Date/Time   CALCIUM 8.8 (L) 10/10/2019 0918   ALKPHOS 73 10/10/2019 0918   AST 27 10/10/2019 0918   ALT 28 10/10/2019 0918   BILITOT 0.3  10/10/2019 0918       RADIOGRAPHIC STUDIES: CT Chest W Contrast  Result Date: 10/06/2019 CLINICAL DATA:  Lung cancer, for restaging EXAM: CT CHEST, ABDOMEN, AND PELVIS WITH CONTRAST TECHNIQUE: Multidetector CT imaging of the chest, abdomen and pelvis was performed following the standard protocol during bolus administration of intravenous contrast. CONTRAST:  179m OMNIPAQUE IOHEXOL 300 MG/ML  SOLN COMPARISON:  08/07/2019 FINDINGS: CT CHEST FINDINGS Cardiovascular: Heart is normal in size. No pericardial effusion. No evidence of thoracic aortic aneurysm. Right chest port terminates in the mid SVC. Mediastinum/Nodes: Small mediastinal lymph nodes, including a 10 mm short axis low right paratracheal node (series 2/image 28), previously 13 mm. No suspicious axillary lymphadenopathy. Visualized thyroid is unremarkable. Lungs/Pleura: Mild biapical pleural-parenchymal scarring. Left lung is otherwise clear. Small right pleural effusion, mildly irregular with pleural enhancement, grossly unchanged. 1.8 x 1.2 x 1.5 cm central right middle lobe nodule (series 4/image 81),  grossly unchanged. Pleural studding along the right minor fissure (series 4/image 80), unchanged. Subpleural nodularity in the anterior right middle lobe measuring up to 5 mm (series 4/image 93), unchanged. 10 mm subpleural nodule in the medial right middle lobe (series 4/image 119), previously 12 mm when measured in a similar fashion, grossly unchanged. Posterior right lower lobe nodular opacity (series 4/image 134), favoring rounded atelectasis, grossly unchanged. Musculoskeletal: Mild degenerative changes of the thoracic spine. CT ABDOMEN PELVIS FINDINGS Hepatobiliary: Liver is within normal limits. No suspicious/enhancing hepatic lesions. Gallbladder is unremarkable. No intrahepatic or extrahepatic ductal dilatation. Pancreas: Within normal limits. Spleen: Within normal limits. Adrenals/Urinary Tract: Adrenal glands are within normal limits.  Kidneys are within normal limits. No hydronephrosis. Bladder is within normal limits. Stomach/Bowel: Stomach is within normal limits. No evidence of bowel obstruction. Normal appendix (series 2/image 97). Very mild left colonic diverticulosis, without evidence of diverticulitis. Vascular/Lymphatic: No evidence of abdominal aortic aneurysm. No suspicious abdominopelvic lymphadenopathy. Faint lymphatic stranding with small nodes in the jejunal mesentery measuring up to 5 mm short axis (series 2/image 88), unchanged. Reproductive: Prostatomegaly, with enlargement of the central gland, indenting the base of the bladder. Other: No abdominopelvic ascites. Postsurgical changes in the left inguinal region. Musculoskeletal: Mild degenerative changes of the lumbar spine. IMPRESSION: 1.8 cm central right middle lobe nodule, corresponding to the patient's known primary bronchogenic neoplasm, grossly unchanged. Associated pleural/subpleural metastases in the right hemithorax with associated small complex right pleural effusion, grossly unchanged. Small mediastinal lymph nodes, mildly improved. No evidence of metastatic disease in the abdomen/pelvis. Additional stable ancillary findings as above. Electronically Signed   By: Julian Hy M.D.   On: 10/06/2019 09:50   MR Brain W Wo Contrast  Result Date: 10/05/2019 CLINICAL DATA:  Brain metastasis. Follow-up from treatment; brain/CNS neoplasm, surveillance. EXAM: MRI HEAD WITHOUT AND WITH CONTRAST TECHNIQUE: Multiplanar, multiecho pulse sequences of the brain and surrounding structures were obtained without and with intravenous contrast. CONTRAST:  2m MULTIHANCE GADOBENATE DIMEGLUMINE 529 MG/ML IV SOLN COMPARISON:  Brain MRI 07/06/2019 FINDINGS: Brain: Continued subtle interval decrease in size and conspicuity of an enhancing metastasis within the right parietal lobe postcentral gyrus, now measuring 2 mm (previously 3 mm) (series 11, image 109) (series 12, image 17).  Unchanged punctate SWI signal loss at this site. No new enhancing intracranial metastases are identified. There is no evidence of acute infarct. No midline shift or extra-axial fluid collection. Redemonstrated are 2 punctate foci of SWI signal loss within the left parietal lobe which may reflect chronic microhemorrhages or mineralization. Stable and minimal scattered T2/FLAIR hyperintensity within the cerebral white matter is nonspecific, but consistent with chronic small vessel ischemic disease. Cerebral volume is normal for age. Vascular: Flow voids maintained within the proximal large arterial vessels. Expected enhancement within the proximal large arterial vessels and dural venous sinuses. Skull and upper cervical spine: No focal marrow lesion. Sinuses/Orbits: Visualized orbits demonstrate no acute abnormality. Mild paranasal sinus mucosal thickening greatest within bilateral ethmoid air cells. No significant mastoid effusion. IMPRESSION: Continued subtle interval decrease in size and conspicuity of an enhancing metastasis within the right parietal lobe, now 2 mm. No new intracranial metastases are identified. Electronically Signed   By: KKellie SimmeringDO   On: 10/05/2019 14:20   CT Abdomen Pelvis W Contrast  Result Date: 10/06/2019 CLINICAL DATA:  Lung cancer, for restaging EXAM: CT CHEST, ABDOMEN, AND PELVIS WITH CONTRAST TECHNIQUE: Multidetector CT imaging of the chest, abdomen and pelvis was performed following the standard protocol during bolus  administration of intravenous contrast. CONTRAST:  140m OMNIPAQUE IOHEXOL 300 MG/ML  SOLN COMPARISON:  08/07/2019 FINDINGS: CT CHEST FINDINGS Cardiovascular: Heart is normal in size. No pericardial effusion. No evidence of thoracic aortic aneurysm. Right chest port terminates in the mid SVC. Mediastinum/Nodes: Small mediastinal lymph nodes, including a 10 mm short axis low right paratracheal node (series 2/image 28), previously 13 mm. No suspicious axillary  lymphadenopathy. Visualized thyroid is unremarkable. Lungs/Pleura: Mild biapical pleural-parenchymal scarring. Left lung is otherwise clear. Small right pleural effusion, mildly irregular with pleural enhancement, grossly unchanged. 1.8 x 1.2 x 1.5 cm central right middle lobe nodule (series 4/image 81), grossly unchanged. Pleural studding along the right minor fissure (series 4/image 80), unchanged. Subpleural nodularity in the anterior right middle lobe measuring up to 5 mm (series 4/image 93), unchanged. 10 mm subpleural nodule in the medial right middle lobe (series 4/image 119), previously 12 mm when measured in a similar fashion, grossly unchanged. Posterior right lower lobe nodular opacity (series 4/image 134), favoring rounded atelectasis, grossly unchanged. Musculoskeletal: Mild degenerative changes of the thoracic spine. CT ABDOMEN PELVIS FINDINGS Hepatobiliary: Liver is within normal limits. No suspicious/enhancing hepatic lesions. Gallbladder is unremarkable. No intrahepatic or extrahepatic ductal dilatation. Pancreas: Within normal limits. Spleen: Within normal limits. Adrenals/Urinary Tract: Adrenal glands are within normal limits. Kidneys are within normal limits. No hydronephrosis. Bladder is within normal limits. Stomach/Bowel: Stomach is within normal limits. No evidence of bowel obstruction. Normal appendix (series 2/image 97). Very mild left colonic diverticulosis, without evidence of diverticulitis. Vascular/Lymphatic: No evidence of abdominal aortic aneurysm. No suspicious abdominopelvic lymphadenopathy. Faint lymphatic stranding with small nodes in the jejunal mesentery measuring up to 5 mm short axis (series 2/image 88), unchanged. Reproductive: Prostatomegaly, with enlargement of the central gland, indenting the base of the bladder. Other: No abdominopelvic ascites. Postsurgical changes in the left inguinal region. Musculoskeletal: Mild degenerative changes of the lumbar spine. IMPRESSION:  1.8 cm central right middle lobe nodule, corresponding to the patient's known primary bronchogenic neoplasm, grossly unchanged. Associated pleural/subpleural metastases in the right hemithorax with associated small complex right pleural effusion, grossly unchanged. Small mediastinal lymph nodes, mildly improved. No evidence of metastatic disease in the abdomen/pelvis. Additional stable ancillary findings as above. Electronically Signed   By: SJulian HyM.D.   On: 10/06/2019 09:50    ASSESSMENT AND PLAN: This is a very pleasant 69years old white male recently diagnosed with a stage IV (T2b, N2, M1C) non-small cell lung cancer, adenocarcinoma with positive EGFR mutation in exon 20 (resistant mutation) diagnosed in July 2020 and presented with extensive disease involving the right upper lobe as well as the right middle and lower lobe with extensive pleural based metastasis as well as mediastinal and hilar disease with malignant pleural effusion as well as liver and brain metastasis. Unfortunately the patient has no actionable mutations based on the molecular studies by [facility].   The patient is currently undergoing systemic chemotherapy with carboplatin for AUC of 5, Alimta 500 mg/M2 and Keytruda 200 mg IV every 3 weeks status post 10 cycles.  Starting from cycle #5 the patient is on maintenance treatment with Alimta and Keytruda. He continues to tolerate his treatment well with no concerning adverse effects. I recommended for him to proceed with cycle #11 today as planned. I will see him back for follow-up visit in 3 weeks for evaluation before the next cycle of his treatment. He was advised to call immediately if he has any concerning symptoms in the interval. For the  prostate cancer, the patient will continue his routine follow-up visit by his urologist. For the coronary atherosclerosis, he will continue his routine follow-up visit and evaluation by cardiology.  The patient voices  understanding of current disease status and treatment options and is in agreement with the current care plan. All questions were answered. The patient knows to call the clinic with any problems, questions or concerns. We can certainly see the patient much sooner if necessary.  Disclaimer: This note was dictated with voice recognition software. Similar sounding words can inadvertently be transcribed and may not be corrected upon review.

## 2019-10-31 NOTE — Progress Notes (Signed)
Scheduling message sent regarding pt's request to change tx scheduled for week of July 10-17th as pt will be on vacation.

## 2019-10-31 NOTE — Patient Instructions (Signed)
[facility] Discharge Instructions for Patients Receiving Chemotherapy  Today you received the following chemotherapy agents Pembrolizumab (KEYTRUDA) & Pemetrexed (ALIMTA).  To help prevent nausea and vomiting after your treatment, we encourage you to take your nausea medication as prescribed.   If you develop nausea and vomiting that is not controlled by your nausea medication, call the clinic.   BELOW ARE SYMPTOMS THAT SHOULD BE REPORTED IMMEDIATELY:  *FEVER GREATER THAN 100.5 F  *CHILLS WITH OR WITHOUT FEVER  NAUSEA AND VOMITING THAT IS NOT CONTROLLED WITH YOUR NAUSEA MEDICATION  *UNUSUAL SHORTNESS OF BREATH  *UNUSUAL BRUISING OR BLEEDING  TENDERNESS IN MOUTH AND THROAT WITH OR WITHOUT PRESENCE OF ULCERS  *URINARY PROBLEMS  *BOWEL PROBLEMS  UNUSUAL RASH Items with * indicate a potential emergency and should be followed up as soon as possible.  Feel free to call the clinic should you have any questions or concerns. The clinic phone number is [phone].  Please show the [facility] at check-in to the Emergency Department and triage nurse.  Coronavirus (COVID-19) Are you at risk?  Are you at risk for the Coronavirus (COVID-19)?  To be considered HIGH RISK for Coronavirus (COVID-19), you have to meet the following criteria:  . Traveled to [place], [place], [place], [place] or [place]; or in the [place] to [place], [place], [place], or [place]; and have fever, cough, and shortness of breath within the last 2 weeks of travel OR . Been in close contact with a person diagnosed with COVID-19 within the last 2 weeks and have fever, cough, and shortness of breath . IF YOU DO NOT MEET THESE CRITERIA, YOU ARE CONSIDERED LOW RISK FOR COVID-19.  What to do if you are HIGH RISK for COVID-19?  Marland Kitchen If you are having a medical emergency, call 911. . Seek medical care right away. Before you go to a doctor's office, urgent care or emergency department,  call ahead and tell them about your recent travel, contact with someone diagnosed with COVID-19, and your symptoms. You should receive instructions from your physician's office regarding next steps of care.  . When you arrive at healthcare provider, tell the healthcare staff immediately you have returned from visiting [place], [place], [place], [place] or [place]; or traveled in the [place] to [place], [place], [place], or [place]; in the last two weeks or you have been in close contact with a person diagnosed with COVID-19 in the last 2 weeks.   . Tell the health care staff about your symptoms: fever, cough and shortness of breath. . After you have been seen by a medical provider, you will be either: o Tested for (COVID-19) and discharged home on quarantine except to seek medical care if symptoms worsen, and asked to  - Stay home and avoid contact with others until you get your results (4-5 days)  - Avoid travel on public transportation if possible (such as bus, train, or airplane) or o Sent to the Emergency Department by EMS for evaluation, COVID-19 testing, and possible admission depending on your condition and test results.  What to do if you are LOW RISK for COVID-19?  Reduce your risk of any infection by using the same precautions used for avoiding the common cold or flu:  Marland Kitchen Wash your hands often with soap and warm water for at least 20 seconds.  If soap and water are not readily available, use an alcohol-based hand sanitizer with at least 60% alcohol.  Marland Kitchen  If coughing or sneezing, cover your mouth and nose by coughing or sneezing into the elbow areas of your shirt or coat, into a tissue or into your sleeve (not your hands). . Avoid shaking hands with others and consider head nods or verbal greetings only. . Avoid touching your eyes, nose, or mouth with unwashed hands.  . Avoid close contact with people who are sick. . Avoid places or events with large numbers of people in one  location, like concerts or sporting events. . Carefully consider travel plans you have or are making. . If you are planning any travel outside or inside the [place], visit the CDC's Travelers' Health webpage for the latest health notices. . If you have some symptoms but not all symptoms, continue to monitor at home and seek medical attention if your symptoms worsen. . If you are having a medical emergency, call 911.   [place] / e-Visit: [email or web address]         [facility]: [facility]: [phone]                   [facility]: [phone]

## 2019-11-01 ENCOUNTER — Telehealth: Payer: Self-pay | Admitting: Internal Medicine

## 2019-11-01 NOTE — Telephone Encounter (Signed)
Scheduled per los. Called and left msg. Mailed printout  °

## 2019-11-09 DIAGNOSIS — H21231 Degeneration of iris (pigmentary), right eye: Secondary | ICD-10-CM | POA: Diagnosis not present

## 2019-11-09 DIAGNOSIS — H43393 Other vitreous opacities, bilateral: Secondary | ICD-10-CM | POA: Diagnosis not present

## 2019-11-09 DIAGNOSIS — H524 Presbyopia: Secondary | ICD-10-CM | POA: Diagnosis not present

## 2019-11-09 DIAGNOSIS — Z961 Presence of intraocular lens: Secondary | ICD-10-CM | POA: Diagnosis not present

## 2019-11-16 NOTE — Progress Notes (Signed)
Pharmacist Chemotherapy Monitoring - Follow Up Assessment    I verify that I have reviewed each item in the below checklist:  . Regimen for the patient is scheduled for the appropriate day and plan matches scheduled date. Marland Kitchen Appropriate non-routine labs are ordered dependent on drug ordered. . If applicable, additional medications reviewed and ordered per protocol based on lifetime cumulative doses and/or treatment regimen.   Plan for follow-up and/or issues identified: No . I-vent associated with next due treatment: No    Kennith Center, Pharm.D., CPP 11/16/2019@9 :27 AM

## 2019-11-21 ENCOUNTER — Inpatient Hospital Stay: Payer: Medicare Other

## 2019-11-21 ENCOUNTER — Encounter: Payer: Self-pay | Admitting: Internal Medicine

## 2019-11-21 ENCOUNTER — Inpatient Hospital Stay (HOSPITAL_BASED_OUTPATIENT_CLINIC_OR_DEPARTMENT_OTHER): Payer: Medicare Other | Admitting: Internal Medicine

## 2019-11-21 ENCOUNTER — Other Ambulatory Visit: Payer: Self-pay

## 2019-11-21 DIAGNOSIS — Z5111 Encounter for antineoplastic chemotherapy: Secondary | ICD-10-CM | POA: Diagnosis not present

## 2019-11-21 DIAGNOSIS — C3490 Malignant neoplasm of unspecified part of unspecified bronchus or lung: Secondary | ICD-10-CM | POA: Diagnosis not present

## 2019-11-21 DIAGNOSIS — C787 Secondary malignant neoplasm of liver and intrahepatic bile duct: Secondary | ICD-10-CM | POA: Diagnosis not present

## 2019-11-21 DIAGNOSIS — C3491 Malignant neoplasm of unspecified part of right bronchus or lung: Secondary | ICD-10-CM

## 2019-11-21 DIAGNOSIS — C3411 Malignant neoplasm of upper lobe, right bronchus or lung: Secondary | ICD-10-CM | POA: Diagnosis not present

## 2019-11-21 DIAGNOSIS — C61 Malignant neoplasm of prostate: Secondary | ICD-10-CM | POA: Diagnosis not present

## 2019-11-21 DIAGNOSIS — Z95828 Presence of other vascular implants and grafts: Secondary | ICD-10-CM

## 2019-11-21 DIAGNOSIS — C781 Secondary malignant neoplasm of mediastinum: Secondary | ICD-10-CM | POA: Diagnosis not present

## 2019-11-21 DIAGNOSIS — E079 Disorder of thyroid, unspecified: Secondary | ICD-10-CM

## 2019-11-21 DIAGNOSIS — C7931 Secondary malignant neoplasm of brain: Secondary | ICD-10-CM | POA: Diagnosis not present

## 2019-11-21 LAB — CBC WITH DIFFERENTIAL (CANCER CENTER ONLY)
Abs Immature Granulocytes: 0.01 10*3/uL (ref 0.00–0.07)
Basophils Absolute: 0 10*3/uL (ref 0.0–0.1)
Basophils Relative: 1 %
Eosinophils Absolute: 0 10*3/uL (ref 0.0–0.5)
Eosinophils Relative: 1 %
HCT: 38.7 % — ABNORMAL LOW (ref 39.0–52.0)
Hemoglobin: 13.1 g/dL (ref 13.0–17.0)
Immature Granulocytes: 0 %
Lymphocytes Relative: 23 %
Lymphs Abs: 0.8 10*3/uL (ref 0.7–4.0)
MCH: 33.2 pg (ref 26.0–34.0)
MCHC: 33.9 g/dL (ref 30.0–36.0)
MCV: 98 fL (ref 80.0–100.0)
Monocytes Absolute: 0.3 10*3/uL (ref 0.1–1.0)
Monocytes Relative: 9 %
Neutro Abs: 2.4 10*3/uL (ref 1.7–7.7)
Neutrophils Relative %: 66 %
Platelet Count: 229 10*3/uL (ref 150–400)
RBC: 3.95 MIL/uL — ABNORMAL LOW (ref 4.22–5.81)
RDW: 13.9 % (ref 11.5–15.5)
WBC Count: 3.6 10*3/uL — ABNORMAL LOW (ref 4.0–10.5)
nRBC: 0 % (ref 0.0–0.2)

## 2019-11-21 LAB — CMP (CANCER CENTER ONLY)
ALT: 29 U/L (ref 0–44)
AST: 28 U/L (ref 15–41)
Albumin: 3.4 g/dL — ABNORMAL LOW (ref 3.5–5.0)
Alkaline Phosphatase: 66 U/L (ref 38–126)
Anion gap: 6 (ref 5–15)
BUN: 20 mg/dL (ref 8–23)
CO2: 24 mmol/L (ref 22–32)
Calcium: 8.8 mg/dL — ABNORMAL LOW (ref 8.9–10.3)
Chloride: 108 mmol/L (ref 98–111)
Creatinine: 1.07 mg/dL (ref 0.61–1.24)
GFR, Est AFR Am: >60 mL/min (ref >60)
GFR, Estimated: >60 mL/min (ref >60)
Glucose, Bld: 100 mg/dL — ABNORMAL HIGH (ref 70–99)
Potassium: 4.2 mmol/L (ref 3.5–5.1)
Sodium: 138 mmol/L (ref 135–145)
Total Bilirubin: 0.4 mg/dL (ref 0.3–1.2)
Total Protein: 6.3 g/dL — ABNORMAL LOW (ref 6.5–8.1)

## 2019-11-21 LAB — TSH: TSH: 1.001 u[IU]/mL (ref 0.320–4.118)

## 2019-11-21 MED ORDER — SODIUM CHLORIDE 0.9 % IV SOLN
Freq: Once | INTRAVENOUS | Status: AC
  Filled 2019-11-21: qty 250

## 2019-11-21 MED ORDER — PROCHLORPERAZINE MALEATE 10 MG PO TABS: 10.0000 mg | ORAL_TABLET | Freq: Once | ORAL | Status: DC

## 2019-11-21 MED ORDER — HEPARIN SOD (PORK) LOCK FLUSH 100 UNIT/ML IV SOLN
500.0000 [IU] | Freq: Once | INTRAVENOUS | Status: AC | PRN
  Administered 2019-11-21: 500 [IU]
  Filled 2019-11-21: qty 5

## 2019-11-21 MED ORDER — SODIUM CHLORIDE 0.9% FLUSH
10.0000 mL | INTRAVENOUS | Status: DC | PRN
  Administered 2019-11-21: 10 mL
  Filled 2019-11-21: qty 10

## 2019-11-21 MED ORDER — SODIUM CHLORIDE 0.9 % IV SOLN
475.0000 mg/m2 | Freq: Once | INTRAVENOUS | Status: AC
  Administered 2019-11-21: 1000 mg via INTRAVENOUS
  Filled 2019-11-21: qty 40

## 2019-11-21 MED ORDER — SODIUM CHLORIDE 0.9 % IV SOLN
200.0000 mg | Freq: Once | INTRAVENOUS | Status: AC
  Administered 2019-11-21: 200 mg via INTRAVENOUS
  Filled 2019-11-21: qty 8

## 2019-11-21 NOTE — Progress Notes (Signed)
[place] Telephone:([phone]) [phone]   Fax:(336) [phone]  OFFICE PROGRESS NOTE  Laurey Morale, MD [place] [address]  DIAGNOSIS: Stage IV (T2b, N2, M1c) presented with right middle lobe lung mass in addition to mediastinal lymphadenopathy as well as malignant right pleural effusion with pleural-based nodules and suspicious right hepatic lesion and the brain metastasis diagnosed in July 2020.  Molecular Biomarkers: [id number] (Exon 20 insertion) 0.2% Dacomitinib,Neratinib,Osimertinib  [id number] 0.1% None    PRIOR THERAPY: None  CURRENT THERAPY: Systemic chemotherapy with carboplatin for AUC of 5, Alimta 500 mg/M2 and Keytruda 200 mg IV every 3 weeks.  First dose April 04, 2019.  Status post 11 cycles  Starting from cycle #5 the patient is on maintenance treatment with Alimta and Keytruda every 3 weeks.  INTERVAL HISTORY: Ethan Brown 69 y.o. male returns to the clinic today for follow-up visit.  The patient is feeling fine today with no concerning complaints.  He denied having any current chest pain, shortness of breath, cough or hemoptysis.  He denied having any fever or chills.  He has no nausea, vomiting, diarrhea or constipation.  He denied having any headache or visual changes.  He had mild swelling of the lower extremity but this is improved in the last few days.  Is here today for evaluation before starting cycle #12 of his treatment.  MEDICAL HISTORY: Past Medical History:  Diagnosis Date  . Borderline hypertension   . Borderline systolic hypertension   . Brain metastasis ([place]) dx'd 01/2019  . Detached vitreous humor, left   . Dyspnea   . ED (erectile dysfunction) of organic origin   . Hyperlipidemia   . Hypertension   . Lens subluxation, left   . Lung cancer ([place]) dx'd 01/2019  . Migraine   . Migraine headache    takes PRN Imitrex  . Pseudophakia   . TGA (transient global amnesia) 2017  . Transient global  amnesia     ALLERGIES:  has No Known Allergies.  MEDICATIONS:  Current Outpatient Medications  Medication Sig Dispense Refill  . cyanocobalamin (,VITAMIN B-12,) 1000 MCG/ML injection INJECT 1 ML (1,000 MCG TOTAL) INTO THE MUSCLE ONCE FOR 1 DOSE. 5 mL 1  . folic acid (FOLVITE) 1 MG tablet Take 1 tablet (1 mg total) by mouth daily. 90 tablet 1  . lidocaine-prilocaine (EMLA) cream Apply to the Port-A-Cath site 30-60 minutes before treatment 30 g 0  . benzonatate (TESSALON) 200 MG capsule Take 1 capsule (200 mg total) by mouth 3 (three) times daily as needed for cough. (Patient not taking: Reported on 11/21/2019) 30 capsule 0  . prochlorperazine (COMPAZINE) 10 MG tablet Take 1 tablet (10 mg total) by mouth every 6 (six) hours as needed for nausea or vomiting. (Patient not taking: Reported on 11/21/2019) 30 tablet 0  . sildenafil (REVATIO) 20 MG tablet TAKE AS DIRECTED 10 tablet 11  . SUMAtriptan (IMITREX) 50 MG tablet Take 50 mg by mouth every 2 (two) hours as needed for migraine.     . traMADol (ULTRAM) 50 MG tablet Take 1 tablet (50 mg total) by mouth every 6 (six) hours as needed for severe pain. (Patient not taking: Reported on 11/21/2019) 20 tablet 0   No current facility-administered medications for this visit.    SURGICAL HISTORY:  Past Surgical History:  Procedure Laterality Date  . CAROTID DOPPLERS  09/2017   Mild non-obstructive disease  . CATARACT EXTRACTION, BILATERAL    . cataract,  left  2018  . CHEST TUBE INSERTION Right 03/29/2019   Procedure: INSERTION PLEURAL DRAINAGE CATHETER;  Surgeon: [name], MD;  Location: [facility];  Service: Thoracic;  Laterality: Right;  . COLONOSCOPY  2016   clear, repeat in 10 yrs   . Eye surgeries     , Lens attachment.  Vitrectomy  . FEMORAL HERNIA REPAIR    . HERNIA REPAIR    . IR THORACENTESIS ASP PLEURAL SPACE W/IMG GUIDE  02/22/2019  . IR THORACENTESIS ASP PLEURAL SPACE W/IMG GUIDE  03/13/2019  . LUMBAR LAMINECTOMY    . PLEURAL  BIOPSY Right 03/29/2019   Procedure: PLEURAL BIOPSY;  Surgeon: [name], MD;  Location: [facility];  Service: Thoracic;  Laterality: Right;  . PORTACATH PLACEMENT N/A 03/29/2019   Procedure: INSERTION PORT-A-CATH;  Surgeon: [name], MD;  Location: [facility];  Service: Thoracic;  Laterality: N/A;  . retinal attachment, right    . sclearl buckle, right    . victrectomy,right    . VIDEO ASSISTED THORACOSCOPY Right 03/29/2019   Procedure: VIDEO ASSISTED THORACOSCOPY;  Surgeon: [name], MD;  [facility];  Service: Thoracic;  Laterality: Right;    REVIEW OF SYSTEMS:  A comprehensive review of systems was negative.   PHYSICAL EXAMINATION: General appearance: alert, cooperative and no distress Head: Normocephalic, without obvious abnormality, atraumatic Neck: no adenopathy, no JVD, supple, symmetrical, trachea midline and thyroid not enlarged, symmetric, no tenderness/mass/nodules Lymph nodes: Cervical, supraclavicular, and axillary nodes normal. Resp: clear to auscultation bilaterally Back: symmetric, no curvature. ROM normal. No CVA tenderness. Cardio: regular rate and rhythm, S1, S2 normal, no murmur, click, rub or gallop GI: soft, non-tender; bowel sounds normal; no masses,  no organomegaly Extremities: extremities normal, atraumatic, no cyanosis or edema  ECOG PERFORMANCE STATUS: 1 - Symptomatic but completely ambulatory  Blood pressure 136/82, pulse 71, temperature 98.2 F (36.8 C), temperature source Oral, resp. rate 18, height '5\' 11"'  (1.803 m), weight 210 lb 6.4 oz (95.4 kg), SpO2 99 %.  LABORATORY DATA: Lab Results  Component Value Date   WBC 3.6 (L) 11/21/2019   HGB 13.1 11/21/2019   HCT 38.7 (L) 11/21/2019   MCV 98.0 11/21/2019   PLT 229 11/21/2019      Chemistry      Component Value Date/Time   NA 140 10/31/2019 0945   K 4.3 10/31/2019 0945   CL 107 10/31/2019 0945   CO2 26 10/31/2019 0945   BUN 24 (H) 10/31/2019 0945   CREATININE 1.02  10/31/2019 0945      Component Value Date/Time   CALCIUM 8.8 (L) 10/31/2019 0945   ALKPHOS 69 10/31/2019 0945   AST 28 10/31/2019 0945   ALT 29 10/31/2019 0945   BILITOT 0.4 10/31/2019 0945       RADIOGRAPHIC STUDIES: No results found.  ASSESSMENT AND PLAN: This is a very pleasant 69 years old white male recently diagnosed with a stage IV (T2b, N2, M1C) non-small cell lung cancer, adenocarcinoma with positive EGFR mutation in exon 20 (resistant mutation) diagnosed in July 2020 and presented with extensive disease involving the right upper lobe as well as the right middle and lower lobe with extensive pleural based metastasis as well as mediastinal and hilar disease with malignant pleural effusion as well as liver and brain metastasis. Unfortunately the patient has no actionable mutations based on the molecular studies by [employer].   The patient is currently undergoing systemic chemotherapy with carboplatin for AUC of 5, Alimta 500  mg/M2 and Keytruda 200 mg IV every 3 weeks status post 11 cycles.  Starting from cycle #5 the patient is on maintenance treatment with Alimta and Keytruda. The patient has been tolerating this treatment well with no concerning adverse effects. I recommended for him to proceed with cycle #12 today as planned. I will see him back for follow-up visit in 3 weeks for evaluation with repeat CT scan of the chest, abdomen pelvis for restaging of his disease. For the prostate cancer, the patient will continue his routine follow-up visit by his urologist. For the coronary atherosclerosis, he will continue his routine follow-up visit and evaluation by cardiology. The patient was advised to call immediately if he has any concerning symptoms in the interval. The patient voices understanding of current disease status and treatment options and is in agreement with the current care plan. All questions were answered. The patient knows to call the clinic with any problems,  questions or concerns. We can certainly see the patient much sooner if necessary.  Disclaimer: This note was dictated with voice recognition software. Similar sounding words can inadvertently be transcribed and may not be corrected upon review.

## 2019-11-21 NOTE — Patient Instructions (Signed)
Ethan Brown Discharge Instructions for Patients Receiving Chemotherapy  Today you received the following chemotherapy agents Pembrolizumab (KEYTRUDA) & Pemetrexed (ALIMTA).  To help prevent nausea and vomiting after your treatment, we encourage you to take your nausea medication as prescribed.   If you develop nausea and vomiting that is not controlled by your nausea medication, call the clinic.   BELOW ARE SYMPTOMS THAT SHOULD BE REPORTED IMMEDIATELY:  *FEVER GREATER THAN 100.5 F  *CHILLS WITH OR WITHOUT FEVER  NAUSEA AND VOMITING THAT IS NOT CONTROLLED WITH YOUR NAUSEA MEDICATION  *UNUSUAL SHORTNESS OF BREATH  *UNUSUAL BRUISING OR BLEEDING  TENDERNESS IN MOUTH AND THROAT WITH OR WITHOUT PRESENCE OF ULCERS  *URINARY PROBLEMS  *BOWEL PROBLEMS  UNUSUAL RASH Items with * indicate a potential emergency and should be followed up as soon as possible.  Feel free to call the clinic should you have any questions or concerns. The clinic phone number is [phone].  Please show the [place] at check-in to the Emergency Department and triage nurse.

## 2019-11-30 ENCOUNTER — Other Ambulatory Visit: Payer: Self-pay | Admitting: *Deleted

## 2019-11-30 DIAGNOSIS — C7931 Secondary malignant neoplasm of brain: Secondary | ICD-10-CM

## 2019-12-01 ENCOUNTER — Other Ambulatory Visit: Payer: Self-pay | Admitting: *Deleted

## 2019-12-01 DIAGNOSIS — C7931 Secondary malignant neoplasm of brain: Secondary | ICD-10-CM

## 2019-12-04 ENCOUNTER — Other Ambulatory Visit: Payer: Self-pay | Admitting: Radiation Therapy

## 2019-12-06 NOTE — Progress Notes (Signed)
Pharmacist Chemotherapy Monitoring - Follow Up Assessment    I verify that I have reviewed each item in the below checklist:  . Regimen for the patient is scheduled for the appropriate day and plan matches scheduled date. Marland Kitchen Appropriate non-routine labs are ordered dependent on drug ordered. . If applicable, additional medications reviewed and ordered per protocol based on lifetime cumulative doses and/or treatment regimen.   Plan for follow-up and/or issues identified: No . I-vent associated with next due treatment: No . MD and/or nursing notified: No  Acquanetta Belling 12/06/2019 12:00 PM

## 2019-12-07 ENCOUNTER — Encounter: Payer: Self-pay | Admitting: Medical

## 2019-12-10 NOTE — Progress Notes (Signed)
Virtual Visit via Video Note   This visit type was conducted due to national recommendations for restrictions regarding the COVID-19 Pandemic (e.g. social distancing) in an effort to limit this patient's exposure and mitigate transmission in our community.  Due to his co-morbid illnesses, this patient is at least at moderate risk for complications without adequate follow up.  This format is felt to be most appropriate for this patient at this time.  All issues noted in this document were discussed and addressed.  A limited physical exam was performed with this format.  Please refer to the patient's chart for his consent to telehealth for [facility].   Date:  12/11/2019   ID:  Ethan Brown, DOB 1951/04/02, MRN [id number]  Patient Location: Home Provider Location: Home  PCP:  Laurey Morale, MD  Cardiologist:  Dr. Ellyn Hack  Electrophysiologist:  None   Evaluation Performed:  Follow-Up Visit  Chief Complaint:  Bilateral LEE   History of Present Illness:    Ethan Brown is a 69 y.o. male we are following for ongoing assessment and management. He was last seen by Dr. Ellyn Hack on 02/20/2019, for pre-operative evaluation in the setting of possible lung CA, and need for thoracentesis and bronchoscopy. He has a history of borderline HTN,  HL, TGA (transient global amenia).  He has been diagnosed with Stage IV (T2b, N2, M1c)presented with right middle lobe lung mass in addition to mediastinal lymphadenopathy as well as malignant right pleural effusion with pleural-based nodules and suspicious right hepatic lesion and the brain metastasis diagnosed in July 2020. He is on systemic chemotherapy with carboplatin for AUC of 5, Alimta 500 mg/M2 and Keytruda 200 mg IV every 3 weeks.  First dose April 04, 2019.  Status post 11 cycles  Starting from cycle #5 the patient is on maintenance treatment with Alimta and Keytruda every 3 weeks.  He is complaining of LEE and hands were swelling.  He states that his is becoming more prominent over the last few months. Swelling gets a little better at night, but never goes away. Worse in dependent position. He is limiting his salt. He is on Flonase for chronic sinusitis.  Denies worsening breathing, PND, orthopnea. He is medically compliant. He walks about 2-3 miles daily. Feet feel tight in his shoes.  He does have some neuropathy in his feet.   The patient does not have symptoms concerning for COVID-19 infection (fever, chills, cough, or new shortness of breath).    Past Medical History:  Diagnosis Date  . Borderline hypertension   . Borderline systolic hypertension   . Brain metastasis ([place]) dx'd 01/2019  . Detached vitreous humor, left   . Dyspnea   . ED (erectile dysfunction) of organic origin   . Hyperlipidemia   . Hypertension   . Lens subluxation, left   . Lung cancer ([place]) dx'd 01/2019  . Migraine   . Migraine headache    takes PRN Imitrex  . Pseudophakia   . TGA (transient global amnesia) 2017  . Transient global amnesia    Past Surgical History:  Procedure Laterality Date  . CAROTID DOPPLERS  09/2017   Mild non-obstructive disease  . CATARACT EXTRACTION, BILATERAL    . cataract, left  2018  . CHEST TUBE INSERTION Right 03/29/2019   Procedure: INSERTION PLEURAL DRAINAGE CATHETER;  Surgeon: [name], MD;  Location: [facility];  Service: Thoracic;  Laterality: Right;  . COLONOSCOPY  2016   clear, repeat in 10 yrs   .  Eye surgeries     , Lens attachment.  Vitrectomy  . FEMORAL HERNIA REPAIR    . HERNIA REPAIR    . IR THORACENTESIS ASP PLEURAL SPACE W/IMG GUIDE  02/22/2019  . IR THORACENTESIS ASP PLEURAL SPACE W/IMG GUIDE  03/13/2019  . LUMBAR LAMINECTOMY    . PLEURAL BIOPSY Right 03/29/2019   Procedure: PLEURAL BIOPSY;  Surgeon: [name], MD;  Location: [facility];  Service: Thoracic;  Laterality: Right;  . PORTACATH PLACEMENT N/A 03/29/2019   Procedure: INSERTION PORT-A-CATH;  Surgeon: [name], MD;  Location: [facility];  Service: Thoracic;  Laterality: N/A;  . retinal attachment, right    . sclearl buckle, right    . victrectomy,right    . VIDEO ASSISTED THORACOSCOPY Right 03/29/2019   Procedure: VIDEO ASSISTED THORACOSCOPY;  Surgeon: [name], MD;  Location: [facility];  Service: Thoracic;  Laterality: Right;     Current Meds  Medication Sig  . cyanocobalamin (,VITAMIN B-12,) 1000 MCG/ML injection INJECT 1 ML (1,000 MCG TOTAL) INTO THE MUSCLE ONCE FOR 1 DOSE.  . folic acid (FOLVITE) 1 MG tablet Take 1 tablet (1 mg total) by mouth daily.  Marland Kitchen lidocaine-prilocaine (EMLA) cream Apply to the Port-A-Cath site 30-60 minutes before treatment  . Multiple Vitamin (MULTIVITAMIN WITH MINERALS) TABS tablet Take 1 tablet by mouth daily.  . sildenafil (REVATIO) 20 MG tablet TAKE AS DIRECTED  . [DISCONTINUED] SUMAtriptan (IMITREX) 50 MG tablet Take 50 mg by mouth every 2 (two) hours as needed for migraine.      Allergies:   Patient has no known allergies.   Social History   Tobacco Use  . Smoking status: Never Smoker  . Smokeless tobacco: Never Used  Substance Use Topics  . Alcohol use: Yes    Alcohol/week: 1.0 standard drinks    Types: 1 Glasses of wine per week  . Drug use: No     Family Hx: The patient's family history includes Alcohol abuse in his mother; Cancer in his father; Diabetes in his father; Heart attack (age of onset: 75) in his maternal grandfather; Hypertension in his father; Prostate cancer in his father.  ROS:   Please see the history of present illness.    All other systems reviewed and are negative.   Prior CV studies:   The following studies were reviewed today:  Echocardiogram 03/01/2019 1. The left ventricle has normal systolic function, with an ejection  fraction of 55-60%. The cavity size was normal. Left ventricular diastolic  Doppler parameters are consistent with impaired relaxation. No evidence of  left ventricular regional wall   motion abnormalities.  2. The right ventricle has normal systolic function. The cavity was  normal. There is no increase in right ventricular wall thickness. Right  ventricular systolic pressure could not be assessed.   Labs/Other Tests and Data Reviewed:    EKG:  No ECG reviewed.  Recent Labs: 11/21/2019: ALT 29; BUN 20; Creatinine 1.07; Hemoglobin 13.1; Platelet Count 229; Potassium 4.2; Sodium 138; TSH 1.001   Recent Lipid Panel Lab Results  Component Value Date/Time   CHOL 169 08/30/2018 10:49 AM   TRIG 115.0 08/30/2018 10:49 AM   HDL 41.40 08/30/2018 10:49 AM   CHOLHDL 4 08/30/2018 10:49 AM   LDLCALC 104 (H) 08/30/2018 10:49 AM    Wt Readings from Last 3 Encounters:  12/11/19 198 lb (89.8 kg)  11/21/19 210 lb 6.4 oz (95.4 kg)  10/31/19 202 lb (91.6 kg)     Objective:  Vital Signs:  BP 140/80   Pulse 70   Ht 5\' 11"  (1.803 m)   Wt 198 lb (89.8 kg)   BMI 27.62 kg/m    VITAL SIGNS:  reviewed GEN:  no acute distress RESPIRATORY:  normal respiratory effort, symmetric expansion NEURO:  alert and oriented x 3, no obvious focal deficit PSYCH:  normal affect  ASSESSMENT & PLAN:    1. LEE: New onset for him over the last 3 months.  He has been on chemotherapy for one year. I will check his echocardiogram for changes in LV function. I will begin low dose HCTZ 12.5 mg to use prn for edema. Unclear if this is venous insufficiency . Labs are drawn frequently by oncology. Salty 6 flyer is being sent.  Compression hose are recommended. He will send me an email as to how this medication worked for him.   2. Hypertension: BP is slightly elevated today. He is not on any antihypertensive medications. Will follow this and make recommendations after echocardiogram.   3. Lung CA: Mets to the brain. Continue with oncology per their treatment regimen.   4. Hyperlipidemia: Not on statin. Will need to have records of current status. Without history of CAD, LDL < 100.  COVID-19  Education: The signs and symptoms of COVID-19 were discussed with the patient and how to seek care for testing (follow up with PCP or arrange E-visit).  The importance of social distancing was discussed today.  Time:   Today, I have spent 16minutes with the patient with telehealth technology discussing the above problems to include record review and documentation with shared decision making    Medication Adjustments/Labs and Tests Ordered: Current medicines are reviewed at length with the patient today.  Concerns regarding medicines are outlined above.   Tests Ordered: No orders of the defined types were placed in this encounter.   Medication Changes: No orders of the defined types were placed in this encounter.   Disposition:  Follow up Dr.Harding 3 months.   Signed, Phill Myron. [place] Pugh, ANP, AACC  12/11/2019 11:09 AM    [facility]

## 2019-12-11 ENCOUNTER — Other Ambulatory Visit: Payer: Self-pay

## 2019-12-11 ENCOUNTER — Ambulatory Visit (HOSPITAL_COMMUNITY)
Admission: RE | Admit: 2019-12-11 | Discharge: 2019-12-11 | Disposition: A | Discharge status: Home / Self Care | Payer: Medicare Other | Source: Ambulatory Visit | Attending: Internal Medicine | Admitting: Internal Medicine

## 2019-12-11 ENCOUNTER — Encounter: Payer: Self-pay | Admitting: Adult Health

## 2019-12-11 ENCOUNTER — Encounter (HOSPITAL_COMMUNITY): Payer: Self-pay

## 2019-12-11 ENCOUNTER — Telehealth (INDEPENDENT_AMBULATORY_CARE_PROVIDER_SITE_OTHER): Payer: Medicare Other | Admitting: Adult Health

## 2019-12-11 VITALS — BP 140/80 | HR 70 | Ht 71.0 in | Wt 198.0 lb

## 2019-12-11 DIAGNOSIS — I519 Heart disease, unspecified: Secondary | ICD-10-CM | POA: Diagnosis not present

## 2019-12-11 DIAGNOSIS — C3490 Malignant neoplasm of unspecified part of unspecified bronchus or lung: Secondary | ICD-10-CM

## 2019-12-11 DIAGNOSIS — Z5111 Encounter for antineoplastic chemotherapy: Secondary | ICD-10-CM | POA: Diagnosis not present

## 2019-12-11 DIAGNOSIS — E7800 Pure hypercholesterolemia, unspecified: Secondary | ICD-10-CM

## 2019-12-11 DIAGNOSIS — I119 Hypertensive heart disease without heart failure: Secondary | ICD-10-CM | POA: Diagnosis not present

## 2019-12-11 DIAGNOSIS — C7931 Secondary malignant neoplasm of brain: Secondary | ICD-10-CM

## 2019-12-11 DIAGNOSIS — R600 Localized edema: Secondary | ICD-10-CM

## 2019-12-11 DIAGNOSIS — I10 Essential (primary) hypertension: Secondary | ICD-10-CM

## 2019-12-11 DIAGNOSIS — J90 Pleural effusion, not elsewhere classified: Secondary | ICD-10-CM | POA: Diagnosis not present

## 2019-12-11 MED ORDER — HYDROCHLOROTHIAZIDE 12.5 MG PO CAPS: 12.5000 mg | ORAL_CAPSULE | Freq: Every day | ORAL | 3 refills | Status: DC

## 2019-12-11 MED ORDER — IOHEXOL 300 MG/ML  SOLN
100.0000 mL | Freq: Once | INTRAMUSCULAR | Status: AC | PRN
  Administered 2019-12-11: 100 mL via INTRAVENOUS

## 2019-12-11 MED ORDER — SODIUM CHLORIDE (PF) 0.9 % IJ SOLN
INTRAMUSCULAR | Status: AC
  Filled 2019-12-11: qty 50

## 2019-12-11 NOTE — Patient Instructions (Signed)
Medication Instructions:  START- Hydrochlorothiazide 12.5 mg by mouth as needed  *If you need a refill on your cardiac medications before your next appointment, please call your pharmacy*   Lab Work: None Ordered   Testing/Procedures: Your physician has requested that you have an echocardiogram. Echocardiography is a painless test that uses sound waves to create images of your heart. It provides your doctor with information about the size and shape of your heart and how well your heart's chambers and valves are working. This procedure takes approximately one hour. There are no restrictions for this procedure.   Follow-Up: At [facility], you and your health needs are our priority.  As part of our continuing mission to provide you with exceptional heart care, we have created designated Provider Care Teams.  These Care Teams include your primary Cardiologist (physician) and Advanced Practice Providers (APPs -  Physician Assistants and Nurse Practitioners) who all work together to provide you with the care you need, when you need it.  We recommend signing up for the patient portal called "MyChart".  Sign up information is provided on this After Visit Summary.  MyChart is used to connect with patients for Virtual Visits (Telemedicine).  Patients are able to view lab/test results, encounter notes, upcoming appointments, etc.  Non-urgent messages can be sent to your provider as well.   To learn more about what you can do with MyChart, go to [email or web address].    Your next appointment:   3 month(s)  The format for your next appointment:   In Person  Provider:   You may see Glenetta Hew, MD or one of the following Advanced Practice Providers on your designated Care Team:    Rosaria Ferries, PA-C  Jory Sims, DNP, ANP  Cadence Kathlen Mody, NP

## 2019-12-11 NOTE — Addendum Note (Signed)
Addended by: Vennie Homans on: 12/11/2019 12:19 PM   Modules accepted: Orders

## 2019-12-12 ENCOUNTER — Inpatient Hospital Stay: Payer: Medicare Other

## 2019-12-12 ENCOUNTER — Other Ambulatory Visit: Payer: Self-pay

## 2019-12-12 ENCOUNTER — Encounter: Payer: Self-pay | Admitting: Internal Medicine

## 2019-12-12 ENCOUNTER — Inpatient Hospital Stay: Payer: Medicare Other | Attending: Internal Medicine | Admitting: Internal Medicine

## 2019-12-12 VITALS — BP 118/87 | HR 76 | Temp 98.5°F | Resp 20 | Ht 71.0 in | Wt 201.5 lb

## 2019-12-12 DIAGNOSIS — C781 Secondary malignant neoplasm of mediastinum: Secondary | ICD-10-CM | POA: Insufficient documentation

## 2019-12-12 DIAGNOSIS — Z5111 Encounter for antineoplastic chemotherapy: Secondary | ICD-10-CM | POA: Insufficient documentation

## 2019-12-12 DIAGNOSIS — E079 Disorder of thyroid, unspecified: Secondary | ICD-10-CM

## 2019-12-12 DIAGNOSIS — C3411 Malignant neoplasm of upper lobe, right bronchus or lung: Secondary | ICD-10-CM | POA: Insufficient documentation

## 2019-12-12 DIAGNOSIS — C7801 Secondary malignant neoplasm of right lung: Secondary | ICD-10-CM | POA: Insufficient documentation

## 2019-12-12 DIAGNOSIS — C3491 Malignant neoplasm of unspecified part of right bronchus or lung: Secondary | ICD-10-CM | POA: Diagnosis not present

## 2019-12-12 DIAGNOSIS — C782 Secondary malignant neoplasm of pleura: Secondary | ICD-10-CM | POA: Insufficient documentation

## 2019-12-12 DIAGNOSIS — Z79899 Other long term (current) drug therapy: Secondary | ICD-10-CM | POA: Diagnosis not present

## 2019-12-12 DIAGNOSIS — Z5112 Encounter for antineoplastic immunotherapy: Secondary | ICD-10-CM | POA: Diagnosis not present

## 2019-12-12 DIAGNOSIS — Z95828 Presence of other vascular implants and grafts: Secondary | ICD-10-CM

## 2019-12-12 DIAGNOSIS — C787 Secondary malignant neoplasm of liver and intrahepatic bile duct: Secondary | ICD-10-CM | POA: Diagnosis not present

## 2019-12-12 DIAGNOSIS — C7931 Secondary malignant neoplasm of brain: Secondary | ICD-10-CM | POA: Diagnosis not present

## 2019-12-12 DIAGNOSIS — C61 Malignant neoplasm of prostate: Secondary | ICD-10-CM | POA: Insufficient documentation

## 2019-12-12 DIAGNOSIS — I2510 Atherosclerotic heart disease of native coronary artery without angina pectoris: Secondary | ICD-10-CM | POA: Diagnosis not present

## 2019-12-12 LAB — CMP (CANCER CENTER ONLY)
ALT: 25 U/L (ref 0–44)
AST: 26 U/L (ref 15–41)
Albumin: 3.4 g/dL — ABNORMAL LOW (ref 3.5–5.0)
Alkaline Phosphatase: 67 U/L (ref 38–126)
Anion gap: 11 (ref 5–15)
BUN: 23 mg/dL (ref 8–23)
CO2: 26 mmol/L (ref 22–32)
Calcium: 8.9 mg/dL (ref 8.9–10.3)
Chloride: 105 mmol/L (ref 98–111)
Creatinine: 1.1 mg/dL (ref 0.61–1.24)
GFR, Est AFR Am: >60 mL/min (ref >60)
GFR, Estimated: >60 mL/min (ref >60)
Glucose, Bld: 100 mg/dL — ABNORMAL HIGH (ref 70–99)
Potassium: 4.2 mmol/L (ref 3.5–5.1)
Sodium: 142 mmol/L (ref 135–145)
Total Bilirubin: 0.3 mg/dL (ref 0.3–1.2)
Total Protein: 6.4 g/dL — ABNORMAL LOW (ref 6.5–8.1)

## 2019-12-12 LAB — CBC WITH DIFFERENTIAL (CANCER CENTER ONLY)
Abs Immature Granulocytes: 0.01 10*3/uL (ref 0.00–0.07)
Basophils Absolute: 0 10*3/uL (ref 0.0–0.1)
Basophils Relative: 1 %
Eosinophils Absolute: 0.1 10*3/uL (ref 0.0–0.5)
Eosinophils Relative: 1 %
HCT: 40.6 % (ref 39.0–52.0)
Hemoglobin: 13.7 g/dL (ref 13.0–17.0)
Immature Granulocytes: 0 %
Lymphocytes Relative: 18 %
Lymphs Abs: 0.9 10*3/uL (ref 0.7–4.0)
MCH: 33.3 pg (ref 26.0–34.0)
MCHC: 33.7 g/dL (ref 30.0–36.0)
MCV: 98.5 fL (ref 80.0–100.0)
Monocytes Absolute: 0.4 10*3/uL (ref 0.1–1.0)
Monocytes Relative: 8 %
Neutro Abs: 3.9 10*3/uL (ref 1.7–7.7)
Neutrophils Relative %: 72 %
Platelet Count: 258 10*3/uL (ref 150–400)
RBC: 4.12 MIL/uL — ABNORMAL LOW (ref 4.22–5.81)
RDW: 13.9 % (ref 11.5–15.5)
WBC Count: 5.3 10*3/uL (ref 4.0–10.5)
nRBC: 0 % (ref 0.0–0.2)

## 2019-12-12 LAB — TSH: TSH: 1.363 u[IU]/mL (ref 0.320–4.118)

## 2019-12-12 MED ORDER — PROCHLORPERAZINE MALEATE 10 MG PO TABS: 10.0000 mg | ORAL_TABLET | Freq: Once | ORAL | Status: DC

## 2019-12-12 MED ORDER — SODIUM CHLORIDE 0.9 % IV SOLN
Freq: Once | INTRAVENOUS | Status: AC
  Filled 2019-12-12: qty 250

## 2019-12-12 MED ORDER — SODIUM CHLORIDE 0.9% FLUSH
10.0000 mL | INTRAVENOUS | Status: DC | PRN
  Filled 2019-12-12: qty 10

## 2019-12-12 MED ORDER — SODIUM CHLORIDE 0.9% FLUSH
10.0000 mL | INTRAVENOUS | Status: DC | PRN
  Administered 2019-12-12: 10 mL
  Filled 2019-12-12: qty 10

## 2019-12-12 MED ORDER — SODIUM CHLORIDE 0.9 % IV SOLN
475.0000 mg/m2 | Freq: Once | INTRAVENOUS | Status: AC
  Administered 2019-12-12: 1000 mg via INTRAVENOUS
  Filled 2019-12-12: qty 40

## 2019-12-12 MED ORDER — HEPARIN SOD (PORK) LOCK FLUSH 100 UNIT/ML IV SOLN
500.0000 [IU] | Freq: Once | INTRAVENOUS | Status: AC | PRN
  Administered 2019-12-12: 500 [IU]
  Filled 2019-12-12: qty 5

## 2019-12-12 MED ORDER — SODIUM CHLORIDE 0.9 % IV SOLN
200.0000 mg | Freq: Once | INTRAVENOUS | Status: AC
  Administered 2019-12-12: 14:00:00 200 mg via INTRAVENOUS
  Filled 2019-12-12: qty 8

## 2019-12-12 NOTE — Progress Notes (Signed)
[facility] [phone]   [phone]  OFFICE PROGRESS NOTE  Laurey Morale, MD [place] [address]  DIAGNOSIS: Stage IV (T2b, N2, M1c) presented with right middle lobe lung mass in addition to mediastinal lymphadenopathy as well as malignant right pleural effusion with pleural-based nodules and suspicious right hepatic lesion and the brain metastasis diagnosed in July 2020.  Molecular Biomarkers: [id number] (Exon 20 insertion) 0.2% Dacomitinib,Neratinib,Osimertinib  [id number] 0.1% None    PRIOR THERAPY: None  CURRENT THERAPY: Systemic chemotherapy with carboplatin for AUC of 5, Alimta 500 mg/M2 and Keytruda 200 mg IV every 3 weeks.  First dose April 04, 2019.  Status post 11 cycles  Starting from cycle #5 the patient is on maintenance treatment with Alimta and Keytruda every 3 weeks.  INTERVAL HISTORY: Ethan Brown 69 y.o. male returns to the clinic today for follow-up visit.  The patient is feeling fine today with no concerning complaints except for occasional heartburn when he leans on the right side few days after the chemotherapy.  He denied having any current chest pain, shortness of breath, cough or hemoptysis.  He denied having any fever or chills.  He has no nausea, vomiting, diarrhea or constipation.  He denied having any headache or visual changes.  He has no recent weight loss or night sweats.  He had repeat CT scan of the chest, abdomen pelvis performed recently and is here for evaluation and discussion of his scan results.   MEDICAL HISTORY: Past Medical History:  Diagnosis Date  . Borderline hypertension   . Borderline systolic hypertension   . Brain metastasis ([employer]) dx'd 01/2019  . Detached vitreous humor, left   . Dyspnea   . ED (erectile dysfunction) of organic origin   . Hyperlipidemia   . Hypertension   . Lens subluxation, left   . Lung cancer ([place]) dx'd 01/2019  . Migraine   .  Migraine headache    takes PRN Imitrex  . Pseudophakia   . TGA (transient global amnesia) 2017  . Transient global amnesia     ALLERGIES:  has No Known Allergies.  MEDICATIONS:  Current Outpatient Medications  Medication Sig Dispense Refill  . cyanocobalamin (,VITAMIN B-12,) 1000 MCG/ML injection INJECT 1 ML (1,000 MCG TOTAL) INTO THE MUSCLE ONCE FOR 1 DOSE. 5 mL 1  . folic acid (FOLVITE) 1 MG tablet Take 1 tablet (1 mg total) by mouth daily. 90 tablet 1  . hydrochlorothiazide (MICROZIDE) 12.5 MG capsule Take 12.5 mg by mouth as needed.    . lidocaine-prilocaine (EMLA) cream Apply to the Port-A-Cath site 30-60 minutes before treatment 30 g 0  . Multiple Vitamin (MULTIVITAMIN WITH MINERALS) TABS tablet Take 1 tablet by mouth daily.    . sildenafil (REVATIO) 20 MG tablet TAKE AS DIRECTED 10 tablet 11   No current facility-administered medications for this visit.    SURGICAL HISTORY:  Past Surgical History:  Procedure Laterality Date  . CAROTID DOPPLERS  09/2017   Mild non-obstructive disease  . CATARACT EXTRACTION, BILATERAL    . cataract, left  2018  . CHEST TUBE INSERTION Right 03/29/2019   Procedure: INSERTION PLEURAL DRAINAGE CATHETER;  Surgeon: [name], MD;  Location: [facility];  Service: Thoracic;  Laterality: Right;  . COLONOSCOPY  2016   clear, repeat in 10 yrs   . Eye surgeries     , Lens attachment.  Vitrectomy  . FEMORAL HERNIA REPAIR    . HERNIA  REPAIR    . IR THORACENTESIS ASP PLEURAL SPACE W/IMG GUIDE  02/22/2019  . IR THORACENTESIS ASP PLEURAL SPACE W/IMG GUIDE  03/13/2019  . LUMBAR LAMINECTOMY    . PLEURAL BIOPSY Right 03/29/2019   Procedure: PLEURAL BIOPSY;  Surgeon: [name], MD;  Location: [facility];  Service: Thoracic;  Laterality: Right;  . PORTACATH PLACEMENT N/A 03/29/2019   Procedure: INSERTION PORT-A-CATH;  Surgeon: [name], MD;  Location: [facility];  Service: Thoracic;  Laterality: N/A;  . retinal attachment, right    . sclearl  buckle, right    . victrectomy,right    . VIDEO ASSISTED THORACOSCOPY Right 03/29/2019   Procedure: VIDEO ASSISTED THORACOSCOPY;  Surgeon: [name], MD;  Location[facility];  Service: Thoracic;  Laterality: Right;    REVIEW OF SYSTEMS:  Constitutional: negative Eyes: negative Ears, nose, mouth, throat, and face: negative Respiratory: negative Cardiovascular: negative Gastrointestinal: positive for dyspepsia Genitourinary:negative Integument/breast: negative Hematologic/lymphatic: negative Musculoskeletal:negative Neurological: negative Behavioral/Psych: negative Endocrine: negative Allergic/Immunologic: negative   PHYSICAL EXAMINATION: General appearance: alert, cooperative and no distress Head: Normocephalic, without obvious abnormality, atraumatic Neck: no adenopathy, no JVD, supple, symmetrical, trachea midline and thyroid not enlarged, symmetric, no tenderness/mass/nodules Lymph nodes: Cervical, supraclavicular, and axillary nodes normal. Resp: clear to auscultation bilaterally Back: symmetric, no curvature. ROM normal. No CVA tenderness. Cardio: regular rate and rhythm, S1, S2 normal, no murmur, click, rub or gallop GI: soft, non-tender; bowel sounds normal; no masses,  no organomegaly Extremities: extremities normal, atraumatic, no cyanosis or edema Neurologic: Alert and oriented X 3, normal strength and tone. Normal symmetric reflexes. Normal coordination and gait  ECOG PERFORMANCE STATUS: 1 - Symptomatic but completely ambulatory  Blood pressure 118/87, pulse 76, temperature 98.5 F (36.9 C), temperature source Temporal, resp. rate 20, height '5\' 11"'  (1.803 m), weight 201 lb 8 oz (91.4 kg), SpO2 99 %.  LABORATORY DATA: Lab Results  Component Value Date   WBC 5.3 12/12/2019   HGB 13.7 12/12/2019   HCT 40.6 12/12/2019   MCV 98.5 12/12/2019   PLT 258 12/12/2019      Chemistry      Component Value Date/Time   NA 142 12/12/2019 1200   K 4.2 12/12/2019 1200    CL 105 12/12/2019 1200   CO2 26 12/12/2019 1200   BUN 23 12/12/2019 1200   CREATININE 1.10 12/12/2019 1200      Component Value Date/Time   CALCIUM 8.9 12/12/2019 1200   ALKPHOS 67 12/12/2019 1200   AST 26 12/12/2019 1200   ALT 25 12/12/2019 1200   BILITOT 0.3 12/12/2019 1200       RADIOGRAPHIC STUDIES: CT Chest W Contrast  Result Date: 12/11/2019 CLINICAL DATA:  Non-small cell lung cancer, staging. Chemotherapy and Keytruda ongoing. EXAM: CT CHEST, ABDOMEN, AND PELVIS WITH CONTRAST TECHNIQUE: Multidetector CT imaging of the chest, abdomen and pelvis was performed following the standard protocol during bolus administration of intravenous contrast. CONTRAST:  159m OMNIPAQUE IOHEXOL 300 MG/ML  SOLN COMPARISON:  10/06/2019. FINDINGS: CT CHEST FINDINGS Cardiovascular: Right IJ Port-A-Cath terminates in the SVC. Heart size normal. No pericardial effusion. Mediastinum/Nodes: Mediastinal lymph nodes are not enlarged by CT size criteria. No hilar or axillary adenopathy. Esophagus is unremarkable. Lungs/Pleura: Biapical pleuroparenchymal scarring. Central right middle lobe nodule measures 1.4 x 1.4 cm (7/80), decreased slightly from 1.8 cm. Pleural nodularity in the right hemithorax has improved slightly in the interval. Septal thickening in the right lower lobe, unchanged. Small right pleural effusion, slightly decreased, with associated pleural  thickening and mild compressive atelectasis in the right lower lobe. Trace left pleural effusion. Left lung is otherwise clear. Airway is unremarkable. Musculoskeletal: Degenerative changes in the spine. No worrisome lytic or sclerotic lesions. CT ABDOMEN PELVIS FINDINGS Hepatobiliary: Liver and gallbladder are unremarkable. No biliary ductal dilatation. Pancreas: Negative. Spleen: Negative. Adrenals/Urinary Tract: Adrenal glands and kidneys are unremarkable. Ureters are decompressed. There may be slight bladder wall thickening. Small left lateral bladder  diverticulum. Enlarged prostate indents the bladder. Stomach/Bowel: Stomach, small bowel, appendix and colon are unremarkable. Vascular/Lymphatic: Vascular structures are unremarkable. No pathologically enlarged lymph nodes. Reproductive: Prostate is enlarged. Other: No free fluid. Mild haziness in the small bowel mesentery, as before, nonspecific. Musculoskeletal: Degenerative changes in the spine. IMPRESSION: 1. Slight decrease in size of primary right middle lobe nodule with associated decrease in pleural nodularity in the right hemithorax. No evidence of distant metastatic disease. 2. Small right pleural effusion, slightly decreased, with associated pleural thickening, indicative of chronicity. 3. Trace left pleural effusion. 4. Enlarged prostate. Slight bladder wall thickening may be due to an element of outlet obstruction. Electronically Signed   By: Lorin Picket M.D.   On: 12/11/2019 11:39   CT Abdomen Pelvis W Contrast  Result Date: 12/11/2019 CLINICAL DATA:  Non-small cell lung cancer, staging. Chemotherapy and Keytruda ongoing. EXAM: CT CHEST, ABDOMEN, AND PELVIS WITH CONTRAST TECHNIQUE: Multidetector CT imaging of the chest, abdomen and pelvis was performed following the standard protocol during bolus administration of intravenous contrast. CONTRAST:  118m OMNIPAQUE IOHEXOL 300 MG/ML  SOLN COMPARISON:  10/06/2019. FINDINGS: CT CHEST FINDINGS Cardiovascular: Right IJ Port-A-Cath terminates in the SVC. Heart size normal. No pericardial effusion. Mediastinum/Nodes: Mediastinal lymph nodes are not enlarged by CT size criteria. No hilar or axillary adenopathy. Esophagus is unremarkable. Lungs/Pleura: Biapical pleuroparenchymal scarring. Central right middle lobe nodule measures 1.4 x 1.4 cm (7/80), decreased slightly from 1.8 cm. Pleural nodularity in the right hemithorax has improved slightly in the interval. Septal thickening in the right lower lobe, unchanged. Small right pleural effusion, slightly  decreased, with associated pleural thickening and mild compressive atelectasis in the right lower lobe. Trace left pleural effusion. Left lung is otherwise clear. Airway is unremarkable. Musculoskeletal: Degenerative changes in the spine. No worrisome lytic or sclerotic lesions. CT ABDOMEN PELVIS FINDINGS Hepatobiliary: Liver and gallbladder are unremarkable. No biliary ductal dilatation. Pancreas: Negative. Spleen: Negative. Adrenals/Urinary Tract: Adrenal glands and kidneys are unremarkable. Ureters are decompressed. There may be slight bladder wall thickening. Small left lateral bladder diverticulum. Enlarged prostate indents the bladder. Stomach/Bowel: Stomach, small bowel, appendix and colon are unremarkable. Vascular/Lymphatic: Vascular structures are unremarkable. No pathologically enlarged lymph nodes. Reproductive: Prostate is enlarged. Other: No free fluid. Mild haziness in the small bowel mesentery, as before, nonspecific. Musculoskeletal: Degenerative changes in the spine. IMPRESSION: 1. Slight decrease in size of primary right middle lobe nodule with associated decrease in pleural nodularity in the right hemithorax. No evidence of distant metastatic disease. 2. Small right pleural effusion, slightly decreased, with associated pleural thickening, indicative of chronicity. 3. Trace left pleural effusion. 4. Enlarged prostate. Slight bladder wall thickening may be due to an element of outlet obstruction. Electronically Signed   By: MLorin PicketM.D.   On: 12/11/2019 11:39    ASSESSMENT AND PLAN: This is a very pleasant 69years old white male recently diagnosed with a stage IV (T2b, N2, M1C) non-small cell lung cancer, adenocarcinoma with positive EGFR mutation in exon 20 (resistant mutation) diagnosed in July 2020 and presented with extensive  disease involving the right upper lobe as well as the right middle and lower lobe with extensive pleural based metastasis as well as mediastinal and hilar  disease with malignant pleural effusion as well as liver and brain metastasis. Unfortunately the patient has no actionable mutations based on the molecular studies by [employer].   The patient is currently undergoing systemic chemotherapy with carboplatin for AUC of 5, Alimta 500 mg/M2 and Keytruda 200 mg IV every 3 weeks status post 12 cycles.  Starting from cycle #5 the patient is on maintenance treatment with Alimta and Keytruda. The patient has been tolerating this treatment well with no concerning adverse effects. He had repeat CT scan of the chest, abdomen pelvis performed recently.  I personally and independently reviewed the scans and discussed the results with the patient today. His scan showed no concerning findings for disease progression. I recommended for the patient to continue his current maintenance treatment with Alimta and Keytruda every 3 weeks. He will proceed with cycle #13 today. For the dyspepsia, he will use over-the-counter omeprazole on as-needed basis. For the prostate cancer, the patient will continue his routine follow-up visit by his urologist. For the coronary atherosclerosis, he will continue his routine follow-up visit and evaluation by cardiology. The patient was advised to call immediately if he has any concerning symptoms in the interval. The patient voices understanding of current disease status and treatment options and is in agreement with the current care plan. All questions were answered. The patient knows to call the clinic with any problems, questions or concerns. We can certainly see the patient much sooner if necessary.  Disclaimer: This note was dictated with voice recognition software. Similar sounding words can inadvertently be transcribed and may not be corrected upon review.

## 2019-12-12 NOTE — Patient Instructions (Signed)
[facility] Discharge Instructions for Patients Receiving Chemotherapy  Today you received the following chemotherapy agents: Keytruda, Alimta  To help prevent nausea and vomiting after your treatment, we encourage you to take your nausea medication as directed.    If you develop nausea and vomiting that is not controlled by your nausea medication, call the clinic.   BELOW ARE SYMPTOMS THAT SHOULD BE REPORTED IMMEDIATELY:  *FEVER GREATER THAN 100.5 F  *CHILLS WITH OR WITHOUT FEVER  NAUSEA AND VOMITING THAT IS NOT CONTROLLED WITH YOUR NAUSEA MEDICATION  *UNUSUAL SHORTNESS OF BREATH  *UNUSUAL BRUISING OR BLEEDING  TENDERNESS IN MOUTH AND THROAT WITH OR WITHOUT PRESENCE OF ULCERS  *URINARY PROBLEMS  *BOWEL PROBLEMS  UNUSUAL RASH Items with * indicate a potential emergency and should be followed up as soon as possible.  Feel free to call the clinic should you have any questions or concerns. The clinic phone number is [phone].  Please show the [facility] at check-in to the Emergency Department and triage nurse.

## 2019-12-14 ENCOUNTER — Other Ambulatory Visit: Payer: Self-pay

## 2019-12-14 ENCOUNTER — Ambulatory Visit (HOSPITAL_COMMUNITY): Payer: Medicare Other | Attending: Cardiovascular Disease

## 2019-12-14 DIAGNOSIS — I119 Hypertensive heart disease without heart failure: Secondary | ICD-10-CM | POA: Diagnosis not present

## 2019-12-14 DIAGNOSIS — I519 Heart disease, unspecified: Secondary | ICD-10-CM | POA: Diagnosis not present

## 2019-12-14 DIAGNOSIS — J90 Pleural effusion, not elsewhere classified: Secondary | ICD-10-CM | POA: Diagnosis not present

## 2019-12-14 DIAGNOSIS — Z85118 Personal history of other malignant neoplasm of bronchus and lung: Secondary | ICD-10-CM | POA: Insufficient documentation

## 2019-12-14 DIAGNOSIS — I081 Rheumatic disorders of both mitral and tricuspid valves: Secondary | ICD-10-CM | POA: Diagnosis not present

## 2019-12-14 HISTORY — PX: TRANSTHORACIC ECHOCARDIOGRAM: SHX275

## 2019-12-18 DIAGNOSIS — C61 Malignant neoplasm of prostate: Secondary | ICD-10-CM | POA: Diagnosis not present

## 2019-12-19 NOTE — Progress Notes (Signed)
Virtual Visit via Video Note   This visit type was conducted due to national recommendations for restrictions regarding the COVID-19 Pandemic (e.g. social distancing) in an effort to limit this patient's exposure and mitigate transmission in our community.  Due to his co-morbid illnesses, this patient is at least at moderate risk for complications without adequate follow up.  This format is felt to be most appropriate for this patient at this time.  All issues noted in this document were discussed and addressed.  A limited physical exam was performed with this format.  Please refer to the patient's chart for his consent to telehealth for [facility].   Date:  12/19/2019   ID:  Ethan Brown, DOB Jan 15, 1951, MRN [id number]  Patient Location: Home Provider Location: Office  PCP: Dr. Ellyn Hack Electrophysiologist:  None   Evaluation Performed:  Follow-Up Visit  Chief Complaint: Follow-up with more questions.  History of Present Illness:    Ethan Brown is a 69 y.o. male with known history of pleural effusion, had a thoracentesis and bronchoscopy to evaluate for possible lung cancer.  He has a history of borderline hypertension, hyperlipidemia, and transient global amnesia.  He has been diagnosed with stage IV (T2b, N2, M1c)presented with right middle lobe lung mass in addition to mediastinal lymphadenopathy as well as malignant right pleural effusion with pleural-based nodules and suspicious right hepatic lesion and the brain metastasis diagnosed in July 2020. He is on systemic chemotherapy with carboplatin for AUC of 5, Alimta 500 mg/M2 and Keytruda 200 mg IV every 3 weeks. First dose April 04, 2019. Status post 11cycles Starting from cycle #5 the patient is on maintenance treatment with Alimta and Keytruda every 3 weeks.   On last office visit he was complaining of lower extremity edema and swelling in his hands that have become more prominent over the last few  months.  He had been limiting his salt.  At that time I scheduled I ordered HCTZ 12.5 mg as needed edema.  It was unclear if he was also having venous insufficiency.  He was given a salty 6 flyer to avoid salt intake.  He sent me a patient email asking for [place] second appointment as he had more questions concerning his treatment regimen.  He has also emailed me to give him a status report on a weekly basis.  He did state that the HCTZ did not eliminate swelling in his feet and ankles but it has been improving.  He had elected to take HCTZ daily.  This did offer some improvement.  He also reported that his great grandfather died of a heart attack in his 33s along with maternal grandfather in his 14s.  He also complained of significant neuropathy issues in his feet and was uncertain if this was associated with swelling or to what extent it is related to his cancer treatment.  I have seen today via video visit.  He has a notebook which has several pages of questions.  His main concern is lower extremity edema.  He states he is keeping his sodium down to less than 500 mg a day.  But over the weekend he did have a high salt meal, with polenta, along with continuing to eat leftovers throughout the weekend.  He had significant amount of lower extremity edema thereafter.  He has several questions about why he continues to retain fluid.  He also is planning on going to [place] and wants to know if it is okay if he  flies.  He is also wondering about neurologic pain in his legs and if it is related to the fluid versus other etiology.  He would like to know how much water he should be drinking.  He would like to know how much exercise he should be getting.  He also wants to know how much socializing he can have.  He also asked me to explain his echocardiogram and what that means for him.  The patient does not have symptoms concerning for COVID-19 infection (fever, chills, cough, or new shortness of breath).     Past Medical History:  Diagnosis Date  . Borderline hypertension   . Borderline systolic hypertension   . Brain metastasis ([place]) dx'd 01/2019  . Detached vitreous humor, left   . Dyspnea   . ED (erectile dysfunction) of organic origin   . Hyperlipidemia   . Hypertension   . Lens subluxation, left   . Lung cancer ([place]) dx'd 01/2019  . Migraine   . Migraine headache    takes PRN Imitrex  . Pseudophakia   . TGA (transient global amnesia) 2017  . Transient global amnesia    Past Surgical History:  Procedure Laterality Date  . CAROTID DOPPLERS  09/2017   Mild non-obstructive disease  . CATARACT EXTRACTION, BILATERAL    . cataract, left  2018  . CHEST TUBE INSERTION Right 03/29/2019   Procedure: INSERTION PLEURAL DRAINAGE CATHETER;  Surgeon: [name], MD;  Location: [place];  Service: Thoracic;  Laterality: Right;  . COLONOSCOPY  2016   clear, repeat in 10 yrs   . Eye surgeries     , Lens attachment.  Vitrectomy  . FEMORAL HERNIA REPAIR    . HERNIA REPAIR    . IR THORACENTESIS ASP PLEURAL SPACE W/IMG GUIDE  02/22/2019  . IR THORACENTESIS ASP PLEURAL SPACE W/IMG GUIDE  03/13/2019  . LUMBAR LAMINECTOMY    . PLEURAL BIOPSY Right 03/29/2019   Procedure: PLEURAL BIOPSY;  Surgeon: [name], MD;  Location: [place];  Service: Thoracic;  Laterality: Right;  . PORTACATH PLACEMENT N/A 03/29/2019   Procedure: INSERTION PORT-A-CATH;  Surgeon: [name], MD;  Location: [place];  Service: Thoracic;  Laterality: N/A;  . retinal attachment, right    . sclearl buckle, right    . victrectomy,right    . VIDEO ASSISTED THORACOSCOPY Right 03/29/2019   Procedure: VIDEO ASSISTED THORACOSCOPY;  Surgeon: [name], MD;  Location: [place];  Service: Thoracic;  Laterality: Right;     No outpatient medications have been marked as taking for the 12/20/19 encounter (Appointment) with Lendon Colonel, NP.     Allergies:   Patient has no known allergies.   Social  History   Tobacco Use  . Smoking status: Never Smoker  . Smokeless tobacco: Never Used  Substance Use Topics  . Alcohol use: Yes    Alcohol/week: 1.0 standard drinks    Types: 1 Glasses of wine per week  . Drug use: No     Family Hx: The patient's family history includes Alcohol abuse in his mother; Cancer in his father; Diabetes in his father; Heart attack (age of onset: 44) in his maternal grandfather; Hypertension in his father; Prostate cancer in his father.  ROS:   Please see the history of present illness.    All other systems reviewed and are negative.   Prior CV studies:   The following studies were reviewed today: Echocardiogram 12/14/2019 . Normal GLS -20.6 Abnormal septal motion .  Left ventricular ejection  fraction, by estimation, is 50 to 55%. The left ventricle has low normal  function. The left ventricle has no regional wall motion abnormalities.  Left ventricular diastolic parameters  are indeterminate.  2. Right ventricular systolic function is mildly reduced. The right  ventricular size is mildly enlarged.  3. The mitral valve is normal in structure. Trivial mitral valve  regurgitation. No evidence of mitral stenosis.  4. The aortic valve is tricuspid. Aortic valve regurgitation is not  visualized. No aortic stenosis is present.  5. The inferior vena cava is normal in size with greater than 50%  respiratory variability, suggesting right atrial pressure of 3 mmHg.  Labs/Other Tests and Data Reviewed:    EKG:  No ECG reviewed.  Recent Labs: 12/12/2019: ALT 25; BUN 23; Creatinine 1.10; Hemoglobin 13.7; Platelet Count 258; Potassium 4.2; Sodium 142; TSH 1.363   Recent Lipid Panel Lab Results  Component Value Date/Time   CHOL 169 08/30/2018 10:49 AM   TRIG 115.0 08/30/2018 10:49 AM   HDL 41.40 08/30/2018 10:49 AM   CHOLHDL 4 08/30/2018 10:49 AM   LDLCALC 104 (H) 08/30/2018 10:49 AM    Wt Readings from Last 3 Encounters:  12/12/19 201 lb 8 oz (91.4  kg)  12/11/19 198 lb (89.8 kg)  11/21/19 210 lb 6.4 oz (95.4 kg)     Objective:    Vital Signs:  There were no vitals taken for this visit.    ASSESSMENT & PLAN:    1.  Multiple questions follow-up: I have answered several questions concerning the patient's lower extremity edema.  It could be a combination of vascular insufficiency, chemo, low protein, and salt indiscretion at times.  He may take an additional 12.5 mg of HCTZ for a total of 25 mg for a couple of days in order to help with fluid in the lower extremities.  I have also advised him to consider support hose for help with vascular return.  Concerning his echocardiogram I have reviewed this with him again and his EF is 50 to 55% which is normal for someone his age.  I have given him reassurance.  I have advised that he can go to [place] as long as he is not at high altitudes which may affect his breathing.  I have advised him that he may continue to exercise but not to the point of exhaustion.  He needs to be careful concerning his stamina and not to overdo causing injury or worsening breathing issues.  He is reinforced on low-sodium diet.  He states that he is on a very strict salt restricted diet of 500 mg daily but does occasionally have indiscretion especially over the last weekend causing some lower extremity edema.  Concerning socialization, he is to adhere to COVID restrictions concerning large gatherings and meeting up with friends.  Concerning neurologic pain.  It may be a combination of vascular insufficiency along with history of neurologic discomfort.  He is to follow-up with PCP, oncology, and possibly neurology if necessary at their discretion.  COVID-19 Education: The signs and symptoms of COVID-19 were discussed with the patient and how to seek care for testing (follow up with PCP or arrange E-visit).  The importance of social distancing was discussed today.  Time:   Today, I have spent 20 minutes with the  patient with telehealth technology discussing the above problems.     Medication Adjustments/Labs and Tests Ordered: Current medicines are reviewed at length with the patient today.  Concerns  regarding medicines are outlined above.   Tests Ordered: No orders of the defined types were placed in this encounter.   Medication Changes: No orders of the defined types were placed in this encounter.   Disposition:  Follow up in July with Dr. Ellyn Hack on previously scheduled appointment.  Signed, Phill Myron. West Pugh, ANP, AACC  12/19/2019 5:17 PM    [facility]

## 2019-12-20 ENCOUNTER — Telehealth (INDEPENDENT_AMBULATORY_CARE_PROVIDER_SITE_OTHER): Payer: Medicare Other | Admitting: Adult Health

## 2019-12-20 ENCOUNTER — Encounter: Payer: Self-pay | Admitting: Adult Health

## 2019-12-20 VITALS — BP 129/80 | HR 70 | Ht 69.0 in | Wt 196.0 lb

## 2019-12-20 DIAGNOSIS — Z7189 Other specified counseling: Secondary | ICD-10-CM | POA: Diagnosis not present

## 2019-12-20 DIAGNOSIS — R030 Elevated blood-pressure reading, without diagnosis of hypertension: Secondary | ICD-10-CM | POA: Diagnosis not present

## 2019-12-20 DIAGNOSIS — R600 Localized edema: Secondary | ICD-10-CM

## 2019-12-20 NOTE — Patient Instructions (Signed)
Your physician recommends that you schedule a follow-up appointment in: Keep appointment with Dr Ellyn Hack in July

## 2019-12-22 DIAGNOSIS — C61 Malignant neoplasm of prostate: Secondary | ICD-10-CM | POA: Diagnosis not present

## 2019-12-22 DIAGNOSIS — R3912 Poor urinary stream: Secondary | ICD-10-CM | POA: Diagnosis not present

## 2019-12-22 DIAGNOSIS — R351 Nocturia: Secondary | ICD-10-CM | POA: Diagnosis not present

## 2019-12-22 DIAGNOSIS — N401 Enlarged prostate with lower urinary tract symptoms: Secondary | ICD-10-CM | POA: Diagnosis not present

## 2019-12-23 ENCOUNTER — Other Ambulatory Visit: Payer: Self-pay | Admitting: Family Medicine

## 2019-12-27 NOTE — Progress Notes (Signed)
Pharmacist Chemotherapy Monitoring - Follow Up Assessment    I verify that I have reviewed each item in the below checklist:  . Regimen for the patient is scheduled for the appropriate day and plan matches scheduled date. Marland Kitchen Appropriate non-routine labs are ordered dependent on drug ordered. . If applicable, additional medications reviewed and ordered per protocol based on lifetime cumulative doses and/or treatment regimen.   Plan for follow-up and/or issues identified: No . I-vent associated with next due treatment: No . MD and/or nursing notified: No  Romualdo Bolk [facility] 12/27/2019 10:36 AM

## 2020-01-02 ENCOUNTER — Inpatient Hospital Stay (HOSPITAL_BASED_OUTPATIENT_CLINIC_OR_DEPARTMENT_OTHER): Payer: Medicare Other | Admitting: Internal Medicine

## 2020-01-02 ENCOUNTER — Inpatient Hospital Stay: Payer: Medicare Other | Attending: Internal Medicine

## 2020-01-02 ENCOUNTER — Inpatient Hospital Stay: Payer: Medicare Other

## 2020-01-02 ENCOUNTER — Encounter: Payer: Self-pay | Admitting: Internal Medicine

## 2020-01-02 ENCOUNTER — Other Ambulatory Visit: Payer: Self-pay

## 2020-01-02 VITALS — BP 139/77 | HR 78 | Temp 98.2°F | Resp 18 | Ht 69.0 in | Wt 201.6 lb

## 2020-01-02 DIAGNOSIS — C782 Secondary malignant neoplasm of pleura: Secondary | ICD-10-CM | POA: Diagnosis not present

## 2020-01-02 DIAGNOSIS — Z5111 Encounter for antineoplastic chemotherapy: Secondary | ICD-10-CM | POA: Insufficient documentation

## 2020-01-02 DIAGNOSIS — Z5112 Encounter for antineoplastic immunotherapy: Secondary | ICD-10-CM

## 2020-01-02 DIAGNOSIS — C3491 Malignant neoplasm of unspecified part of right bronchus or lung: Secondary | ICD-10-CM

## 2020-01-02 DIAGNOSIS — C3411 Malignant neoplasm of upper lobe, right bronchus or lung: Secondary | ICD-10-CM | POA: Insufficient documentation

## 2020-01-02 DIAGNOSIS — C7931 Secondary malignant neoplasm of brain: Secondary | ICD-10-CM

## 2020-01-02 DIAGNOSIS — C787 Secondary malignant neoplasm of liver and intrahepatic bile duct: Secondary | ICD-10-CM | POA: Insufficient documentation

## 2020-01-02 DIAGNOSIS — E079 Disorder of thyroid, unspecified: Secondary | ICD-10-CM

## 2020-01-02 DIAGNOSIS — Z79899 Other long term (current) drug therapy: Secondary | ICD-10-CM | POA: Insufficient documentation

## 2020-01-02 DIAGNOSIS — Z95828 Presence of other vascular implants and grafts: Secondary | ICD-10-CM

## 2020-01-02 DIAGNOSIS — J910 Malignant pleural effusion: Secondary | ICD-10-CM | POA: Insufficient documentation

## 2020-01-02 DIAGNOSIS — M7989 Other specified soft tissue disorders: Secondary | ICD-10-CM | POA: Insufficient documentation

## 2020-01-02 LAB — CMP (CANCER CENTER ONLY)
ALT: 25 U/L (ref 0–44)
AST: 29 U/L (ref 15–41)
Albumin: 3.2 g/dL — ABNORMAL LOW (ref 3.5–5.0)
Alkaline Phosphatase: 63 U/L (ref 38–126)
Anion gap: 9 (ref 5–15)
BUN: 18 mg/dL (ref 8–23)
CO2: 27 mmol/L (ref 22–32)
Calcium: 8.9 mg/dL (ref 8.9–10.3)
Chloride: 104 mmol/L (ref 98–111)
Creatinine: 1.09 mg/dL (ref 0.61–1.24)
GFR, Est AFR Am: >60 mL/min (ref >60)
GFR, Estimated: >60 mL/min (ref >60)
Glucose, Bld: 103 mg/dL — ABNORMAL HIGH (ref 70–99)
Potassium: 3.5 mmol/L (ref 3.5–5.1)
Sodium: 140 mmol/L (ref 135–145)
Total Bilirubin: 0.4 mg/dL (ref 0.3–1.2)
Total Protein: 6.4 g/dL — ABNORMAL LOW (ref 6.5–8.1)

## 2020-01-02 LAB — CBC WITH DIFFERENTIAL (CANCER CENTER ONLY)
Abs Immature Granulocytes: 0.01 10*3/uL (ref 0.00–0.07)
Basophils Absolute: 0 10*3/uL (ref 0.0–0.1)
Basophils Relative: 1 %
Eosinophils Absolute: 0.1 10*3/uL (ref 0.0–0.5)
Eosinophils Relative: 1 %
HCT: 38.8 % — ABNORMAL LOW (ref 39.0–52.0)
Hemoglobin: 13.4 g/dL (ref 13.0–17.0)
Immature Granulocytes: 0 %
Lymphocytes Relative: 19 %
Lymphs Abs: 0.8 10*3/uL (ref 0.7–4.0)
MCH: 33.7 pg (ref 26.0–34.0)
MCHC: 34.5 g/dL (ref 30.0–36.0)
MCV: 97.5 fL (ref 80.0–100.0)
Monocytes Absolute: 0.4 10*3/uL (ref 0.1–1.0)
Monocytes Relative: 8 %
Neutro Abs: 3.1 10*3/uL (ref 1.7–7.7)
Neutrophils Relative %: 71 %
Platelet Count: 249 10*3/uL (ref 150–400)
RBC: 3.98 MIL/uL — ABNORMAL LOW (ref 4.22–5.81)
RDW: 13.7 % (ref 11.5–15.5)
WBC Count: 4.4 10*3/uL (ref 4.0–10.5)
nRBC: 0 % (ref 0.0–0.2)

## 2020-01-02 LAB — TSH: TSH: 1.318 u[IU]/mL (ref 0.320–4.118)

## 2020-01-02 MED ORDER — SODIUM CHLORIDE 0.9% FLUSH
10.0000 mL | INTRAVENOUS | Status: DC | PRN
  Administered 2020-01-02: 10 mL
  Filled 2020-01-02: qty 10

## 2020-01-02 MED ORDER — SODIUM CHLORIDE 0.9 % IV SOLN
200.0000 mg | Freq: Once | INTRAVENOUS | Status: AC
  Administered 2020-01-02: 200 mg via INTRAVENOUS
  Filled 2020-01-02: qty 8

## 2020-01-02 MED ORDER — SODIUM CHLORIDE 0.9 % IV SOLN
Freq: Once | INTRAVENOUS | Status: AC
  Filled 2020-01-02: qty 250

## 2020-01-02 MED ORDER — HEPARIN SOD (PORK) LOCK FLUSH 100 UNIT/ML IV SOLN
500.0000 [IU] | Freq: Once | INTRAVENOUS | Status: AC | PRN
  Administered 2020-01-02: 500 [IU]
  Filled 2020-01-02: qty 5

## 2020-01-02 MED ORDER — SODIUM CHLORIDE 0.9 % IV SOLN
475.0000 mg/m2 | Freq: Once | INTRAVENOUS | Status: AC
  Administered 2020-01-02: 1000 mg via INTRAVENOUS
  Filled 2020-01-02: qty 40

## 2020-01-02 MED ORDER — CYANOCOBALAMIN 1000 MCG/ML IJ SOLN: 1000.0000 ug | Freq: Once | INTRAMUSCULAR | Status: DC

## 2020-01-02 MED ORDER — PROCHLORPERAZINE MALEATE 10 MG PO TABS: 10.0000 mg | ORAL_TABLET | Freq: Once | ORAL | Status: DC

## 2020-01-02 NOTE — Progress Notes (Signed)
[place] [phone]   [phone]  OFFICE PROGRESS NOTE  Laurey Morale, MD [place] [address]  DIAGNOSIS: Stage IV (T2b, N2, M1c) presented with right middle lobe lung mass in addition to mediastinal lymphadenopathy as well as malignant right pleural effusion with pleural-based nodules and suspicious right hepatic lesion and the brain metastasis diagnosed in July 2020.  Molecular Biomarkers: [id number] (Exon 20 insertion) 0.2% Dacomitinib,Neratinib,Osimertinib  [id number] 0.1% None    PRIOR THERAPY: None  CURRENT THERAPY: Systemic chemotherapy with carboplatin for AUC of 5, Alimta 500 mg/M2 and Keytruda 200 mg IV every 3 weeks.  First dose April 04, 2019.  Status post 13 cycles  Starting from cycle #5 the patient is on maintenance treatment with Alimta and Keytruda every 3 weeks.  INTERVAL HISTORY: Ethan Brown 69 y.o. male returns to the clinic today for follow-up visit.  The patient is feeling fine today with no concerning complaints.  He had swelling of the lower extremities and was seen by cardiology and 2D echo was unremarkable.  He was a started on treatment with hydrochlorothiazide.  He denied having any current chest pain, shortness of breath, cough or hemoptysis.  He denied having any fever or chills.  He has no nausea, vomiting, diarrhea or constipation.  He has no headache or visual changes.  MEDICAL HISTORY: Past Medical History:  Diagnosis Date  . Borderline hypertension   . Borderline systolic hypertension   . Brain metastasis ([place]) dx'd 01/2019  . Detached vitreous humor, left   . Dyspnea   . ED (erectile dysfunction) of organic origin   . Hyperlipidemia   . Hypertension   . Lens subluxation, left   . Lung cancer ([place]) dx'd 01/2019  . Migraine   . Migraine headache    takes PRN Imitrex  . Pseudophakia   . TGA (transient global amnesia) 2017  . Transient global amnesia      ALLERGIES:  has No Known Allergies.  MEDICATIONS:  Current Outpatient Medications  Medication Sig Dispense Refill  . cyanocobalamin (,VITAMIN B-12,) 1000 MCG/ML injection INJECT 1 ML (1,000 MCG TOTAL) INTO THE MUSCLE ONCE FOR 1 DOSE. 5 mL 1  . folic acid (FOLVITE) 1 MG tablet Take 1 tablet (1 mg total) by mouth daily. 90 tablet 1  . hydrochlorothiazide (MICROZIDE) 12.5 MG capsule Take 12.5 mg by mouth as needed.    . lidocaine-prilocaine (EMLA) cream Apply to the Port-A-Cath site 30-60 minutes before treatment 30 g 0  . Multiple Vitamin (MULTIVITAMIN WITH MINERALS) TABS tablet Take 1 tablet by mouth daily.    . sildenafil (REVATIO) 20 MG tablet TAKE AS DIRECTED 10 tablet 11  . tamsulosin (FLOMAX) 0.4 MG CAPS capsule Take 0.4 mg by mouth daily.     No current facility-administered medications for this visit.    SURGICAL HISTORY:  Past Surgical History:  Procedure Laterality Date  . CAROTID DOPPLERS  09/2017   Mild non-obstructive disease  . CATARACT EXTRACTION, BILATERAL    . cataract, left  2018  . CHEST TUBE INSERTION Right 03/29/2019   Procedure: INSERTION PLEURAL DRAINAGE CATHETER;  Surgeon: [name], MD;  Location: [facility];  Service: Thoracic;  Laterality: Right;  . COLONOSCOPY  2016   clear, repeat in 10 yrs   . Eye surgeries     , Lens attachment.  Vitrectomy  . FEMORAL HERNIA REPAIR    . HERNIA REPAIR    . IR THORACENTESIS ASP PLEURAL  SPACE W/IMG GUIDE  02/22/2019  . IR THORACENTESIS ASP PLEURAL SPACE W/IMG GUIDE  03/13/2019  . LUMBAR LAMINECTOMY    . PLEURAL BIOPSY Right 03/29/2019   Procedure: PLEURAL BIOPSY;  Surgeon: [name], MD;  Location: [facility];  Service: Thoracic;  Laterality: Right;  . PORTACATH PLACEMENT N/A 03/29/2019   Procedure: INSERTION PORT-A-CATH;  Surgeon: [name], MD;  Location: [facility];  Service: Thoracic;  Laterality: N/A;  . retinal attachment, right    . sclearl buckle, right    . victrectomy,right    . VIDEO  ASSISTED THORACOSCOPY Right 03/29/2019   Procedure: VIDEO ASSISTED THORACOSCOPY;  Surgeon: [name], MD;  Location: [facility] OR;  Service: Thoracic;  Laterality: Right;    REVIEW OF SYSTEMS:  A comprehensive review of systems was negative except for: Constitutional: positive for fatigue   PHYSICAL EXAMINATION: General appearance: alert, cooperative and no distress Head: Normocephalic, without obvious abnormality, atraumatic Neck: no adenopathy, no JVD, supple, symmetrical, trachea midline and thyroid not enlarged, symmetric, no tenderness/mass/nodules Lymph nodes: Cervical, supraclavicular, and axillary nodes normal. Resp: clear to auscultation bilaterally Back: symmetric, no curvature. ROM normal. No CVA tenderness. Cardio: regular rate and rhythm, S1, S2 normal, no murmur, click, rub or gallop GI: soft, non-tender; bowel sounds normal; no masses,  no organomegaly Extremities: edema Trace edema bilaterally  ECOG PERFORMANCE STATUS: 1 - Symptomatic but completely ambulatory  Blood pressure 139/77, pulse 78, temperature 98.2 F (36.8 C), temperature source Temporal, resp. rate 18, height _0  (1.753 m), weight 201 lb 9.6 oz (91.4 kg), SpO2 100 %.  LABORATORY DATA: Lab Results  Component Value Date   WBC 4.4 01/02/2020   HGB 13.4 01/02/2020   HCT 38.8 (L) 01/02/2020   MCV 97.5 01/02/2020   PLT 249 01/02/2020      Chemistry      Component Value Date/Time   NA 142 12/12/2019 1200   K 4.2 12/12/2019 1200   CL 105 12/12/2019 1200   CO2 26 12/12/2019 1200   BUN 23 12/12/2019 1200   CREATININE 1.10 12/12/2019 1200      Component Value Date/Time   CALCIUM 8.9 12/12/2019 1200   ALKPHOS 67 12/12/2019 1200   AST 26 12/12/2019 1200   ALT 25 12/12/2019 1200   BILITOT 0.3 12/12/2019 1200       RADIOGRAPHIC STUDIES: CT Chest W Contrast  Result Date: 12/11/2019 CLINICAL DATA:  Non-small cell lung cancer, staging. Chemotherapy and Keytruda ongoing. EXAM: CT CHEST, ABDOMEN,  AND PELVIS WITH CONTRAST TECHNIQUE: Multidetector CT imaging of the chest, abdomen and pelvis was performed following the standard protocol during bolus administration of intravenous contrast. CONTRAST:  165m OMNIPAQUE IOHEXOL 300 MG/ML  SOLN COMPARISON:  10/06/2019. FINDINGS: CT CHEST FINDINGS Cardiovascular: Right IJ Port-A-Cath terminates in the SVC. Heart size normal. No pericardial effusion. Mediastinum/Nodes: Mediastinal lymph nodes are not enlarged by CT size criteria. No hilar or axillary adenopathy. Esophagus is unremarkable. Lungs/Pleura: Biapical pleuroparenchymal scarring. Central right middle lobe nodule measures 1.4 x 1.4 cm (7/80), decreased slightly from 1.8 cm. Pleural nodularity in the right hemithorax has improved slightly in the interval. Septal thickening in the right lower lobe, unchanged. Small right pleural effusion, slightly decreased, with associated pleural thickening and mild compressive atelectasis in the right lower lobe. Trace left pleural effusion. Left lung is otherwise clear. Airway is unremarkable. Musculoskeletal: Degenerative changes in the spine. No worrisome lytic or sclerotic lesions. CT ABDOMEN PELVIS FINDINGS Hepatobiliary: Liver and gallbladder are unremarkable. No biliary ductal dilatation.  Pancreas: Negative. Spleen: Negative. Adrenals/Urinary Tract: Adrenal glands and kidneys are unremarkable. Ureters are decompressed. There may be slight bladder wall thickening. Small left lateral bladder diverticulum. Enlarged prostate indents the bladder. Stomach/Bowel: Stomach, small bowel, appendix and colon are unremarkable. Vascular/Lymphatic: Vascular structures are unremarkable. No pathologically enlarged lymph nodes. Reproductive: Prostate is enlarged. Other: No free fluid. Mild haziness in the small bowel mesentery, as before, nonspecific. Musculoskeletal: Degenerative changes in the spine. IMPRESSION: 1. Slight decrease in size of primary right middle lobe nodule with  associated decrease in pleural nodularity in the right hemithorax. No evidence of distant metastatic disease. 2. Small right pleural effusion, slightly decreased, with associated pleural thickening, indicative of chronicity. 3. Trace left pleural effusion. 4. Enlarged prostate. Slight bladder wall thickening may be due to an element of outlet obstruction. Electronically Signed   By: Lorin Picket M.D.   On: 12/11/2019 11:39   CT Abdomen Pelvis W Contrast  Result Date: 12/11/2019 CLINICAL DATA:  Non-small cell lung cancer, staging. Chemotherapy and Keytruda ongoing. EXAM: CT CHEST, ABDOMEN, AND PELVIS WITH CONTRAST TECHNIQUE: Multidetector CT imaging of the chest, abdomen and pelvis was performed following the standard protocol during bolus administration of intravenous contrast. CONTRAST:  117m OMNIPAQUE IOHEXOL 300 MG/ML  SOLN COMPARISON:  10/06/2019. FINDINGS: CT CHEST FINDINGS Cardiovascular: Right IJ Port-A-Cath terminates in the SVC. Heart size normal. No pericardial effusion. Mediastinum/Nodes: Mediastinal lymph nodes are not enlarged by CT size criteria. No hilar or axillary adenopathy. Esophagus is unremarkable. Lungs/Pleura: Biapical pleuroparenchymal scarring. Central right middle lobe nodule measures 1.4 x 1.4 cm (7/80), decreased slightly from 1.8 cm. Pleural nodularity in the right hemithorax has improved slightly in the interval. Septal thickening in the right lower lobe, unchanged. Small right pleural effusion, slightly decreased, with associated pleural thickening and mild compressive atelectasis in the right lower lobe. Trace left pleural effusion. Left lung is otherwise clear. Airway is unremarkable. Musculoskeletal: Degenerative changes in the spine. No worrisome lytic or sclerotic lesions. CT ABDOMEN PELVIS FINDINGS Hepatobiliary: Liver and gallbladder are unremarkable. No biliary ductal dilatation. Pancreas: Negative. Spleen: Negative. Adrenals/Urinary Tract: Adrenal glands and kidneys  are unremarkable. Ureters are decompressed. There may be slight bladder wall thickening. Small left lateral bladder diverticulum. Enlarged prostate indents the bladder. Stomach/Bowel: Stomach, small bowel, appendix and colon are unremarkable. Vascular/Lymphatic: Vascular structures are unremarkable. No pathologically enlarged lymph nodes. Reproductive: Prostate is enlarged. Other: No free fluid. Mild haziness in the small bowel mesentery, as before, nonspecific. Musculoskeletal: Degenerative changes in the spine. IMPRESSION: 1. Slight decrease in size of primary right middle lobe nodule with associated decrease in pleural nodularity in the right hemithorax. No evidence of distant metastatic disease. 2. Small right pleural effusion, slightly decreased, with associated pleural thickening, indicative of chronicity. 3. Trace left pleural effusion. 4. Enlarged prostate. Slight bladder wall thickening may be due to an element of outlet obstruction. Electronically Signed   By: MLorin PicketM.D.   On: 12/11/2019 11:39   ECHOCARDIOGRAM COMPLETE  Result Date: 12/14/2019    ECHOCARDIOGRAM REPORT   [facility] Name[facility]   [facility]/2019 Medical Rec #:  [id number]            Height:       71.0 in Accession #:    [id number]           Weight:       201.5 lb Date of Birth:  103-29-1952  BSA:          2.115 m Patient Age:    69 years              BP:           136/82 mmHg Patient Gender: M                     HR:           69 bpm. Exam Location:  [facility] Procedure: 2D Echo, Cardiac Doppler, Color Doppler and Strain Analysis Indications:    I51.9 LV dysfunction.  History:        Patient has prior history of Echocardiogram examinations, most                 recent 03/01/2019. Pleural effusion. Lung cancer. Brain                 metsastasis.  Sonographer:    [place] [facility] Referring Phys: [place]  1. Normal GLS -20.6 Abnormal septal motion . Left ventricular  ejection fraction, by estimation, is 50 to 55%. The left ventricle has low normal function. The left ventricle has no regional wall motion abnormalities. Left ventricular diastolic parameters  are indeterminate.  2. Right ventricular systolic function is mildly reduced. The right ventricular size is mildly enlarged.  3. The mitral valve is normal in structure. Trivial mitral valve regurgitation. No evidence of mitral stenosis.  4. The aortic valve is tricuspid. Aortic valve regurgitation is not visualized. No aortic stenosis is present.  5. The inferior vena cava is normal in size with greater than 50% respiratory variability, suggesting right atrial pressure of 3 mmHg. Comparison(s): 03/01/19 EF 55-60%. FINDINGS  Left Ventricle: Normal GLS -20.6 Abnormal septal motion. Left ventricular ejection fraction, by estimation, is 50 to 55%. The left ventricle has low normal function. The left ventricle has no regional wall motion abnormalities. The left ventricular internal cavity size was normal in size. There is no left ventricular hypertrophy. Left ventricular diastolic parameters are indeterminate. Right Ventricle: The right ventricular size is mildly enlarged. Right vetricular wall thickness was not assessed. Right ventricular systolic function is mildly reduced. Left Atrium: Left atrial size was normal in size. Right Atrium: Right atrial size was normal in size. Pericardium: There is no evidence of pericardial effusion. Mitral Valve: The mitral valve is normal in structure. There is mild thickening of the mitral valve leaflet(s). There is mild calcification of the mitral valve leaflet(s). Normal mobility of the mitral valve leaflets. Trivial mitral valve regurgitation. No evidence of mitral valve stenosis. Tricuspid Valve: The tricuspid valve is normal in structure. Tricuspid valve regurgitation is mild . No evidence of tricuspid stenosis. Aortic Valve: The aortic valve is tricuspid. Aortic valve regurgitation is not  visualized. No aortic stenosis is present. Pulmonic Valve: The pulmonic valve was normal in structure. Pulmonic valve regurgitation is not visualized. No evidence of pulmonic stenosis. Aorta: The aortic root is normal in size and structure. Venous: The inferior vena cava is normal in size with greater than 50% respiratory variability, suggesting right atrial pressure of 3 mmHg. IAS/Shunts: No atrial level shunt detected by color flow Doppler.  LEFT VENTRICLE PLAX 2D LVIDd:         5.60 cm  Diastology LVIDs:         3.60 cm  LV e' lateral:   6.75 cm/s LV PW:         1.00 cm  LV E/e' lateral: 6.7 LV IVS:        0.90 cm  LV e' medial:    6.31 cm/s LVOT diam:     2.30 cm  LV E/e' medial:  7.2 LV SV:         53 LV SV Index:   25       2D Longitudinal Strain LVOT Area:     4.15 cm 2D Strain GLS (A2C):   -24.5 %                         2D Strain GLS (A3C):   -23.0 %                         2D Strain GLS (A4C):   -14.2 %                         2D Strain GLS Avg:     -20.6 % RIGHT VENTRICLE RV Basal diam:  3.50 cm RV S prime:     13.60 cm/s TAPSE (M-mode): 2.1 cm LEFT ATRIUM             Index       RIGHT ATRIUM           Index LA diam:        3.70 cm 1.75 cm/m  RA Pressure: 3.00 mmHg LA Vol (A2C):   32.3 ml 15.27 ml/m RA Area:     14.40 cm LA Vol (A4C):   34.7 ml 16.40 ml/m RA Volume:   35.70 ml  16.88 ml/m LA Biplane Vol: 34.5 ml 16.31 ml/m  AORTIC VALVE LVOT Vmax:   67.38 cm/s LVOT Vmean:  47.575 cm/s LVOT VTI:    0.127 m  AORTA Ao Root diam: 3.60 cm Ao Asc diam:  3.50 cm MITRAL VALVE               TRICUSPID VALVE                            Estimated RAP:  3.00 mmHg  MV E velocity: 45.30 cm/s  SHUNTS MV A velocity: 56.40 cm/s  Systemic VTI:  0.13 m MV E/A ratio:  0.80        Systemic Diam: 2.30 cm Jenkins Rouge MD Electronically signed by Jenkins Rouge MD Signature Date/Time: 12/14/2019/11:23:18 AM    Final     ASSESSMENT AND PLAN: This is a very pleasant 69 years old white male recently diagnosed with a stage IV  (T2b, N2, M1C) non-small cell lung cancer, adenocarcinoma with positive EGFR mutation in exon 20 (resistant mutation) diagnosed in July 2020 and presented with extensive disease involving the right upper lobe as well as the right middle and lower lobe with extensive pleural based metastasis as well as mediastinal and hilar disease with malignant pleural effusion as well as liver and brain metastasis. Unfortunately the patient has no actionable mutations based on the molecular studies by [employer].   The patient is currently undergoing systemic chemotherapy with carboplatin for AUC of 5, Alimta 500 mg/M2 and Keytruda 200 mg IV every 3 weeks status post 13 cycles.  Starting from cycle #5 the patient is on maintenance treatment with Alimta and Keytruda. The patient has been tolerating this treatment well with no concerning adverse effects. I recommended for him to proceed with cycle #14 today as  planned. He will come back for follow-up visit in 3 weeks for evaluation before the next cycle of his treatment. For the swelling of the lower extremities, he will continue his current treatment with hydrochlorothiazide. The patient was advised to call immediately if he has any concerning symptoms in the interval. The patient voices understanding of current disease status and treatment options and is in agreement with the current care plan. All questions were answered. The patient knows to call the clinic with any problems, questions or concerns. We can certainly see the patient much sooner if necessary.  Disclaimer: This note was dictated with voice recognition software. Similar sounding words can inadvertently be transcribed and may not be corrected upon review.

## 2020-01-02 NOTE — Patient Instructions (Signed)
[facility] Discharge Instructions for Patients Receiving Chemotherapy  Today you received the following chemotherapy agents: Keytruda, Alimta  To help prevent nausea and vomiting after your treatment, we encourage you to take your nausea medication as directed.    If you develop nausea and vomiting that is not controlled by your nausea medication, call the clinic.   BELOW ARE SYMPTOMS THAT SHOULD BE REPORTED IMMEDIATELY:  *FEVER GREATER THAN 100.5 F  *CHILLS WITH OR WITHOUT FEVER  NAUSEA AND VOMITING THAT IS NOT CONTROLLED WITH YOUR NAUSEA MEDICATION  *UNUSUAL SHORTNESS OF BREATH  *UNUSUAL BRUISING OR BLEEDING  TENDERNESS IN MOUTH AND THROAT WITH OR WITHOUT PRESENCE OF ULCERS  *URINARY PROBLEMS  *BOWEL PROBLEMS  UNUSUAL RASH Items with * indicate a potential emergency and should be followed up as soon as possible.  Feel free to call the clinic should you have any questions or concerns. The clinic phone number is [phone].  Please show the [facility] at check-in to the Emergency Department and triage nurse.

## 2020-01-03 ENCOUNTER — Encounter: Payer: Self-pay | Admitting: *Deleted

## 2020-01-03 ENCOUNTER — Telehealth: Payer: Self-pay | Admitting: Internal Medicine

## 2020-01-03 NOTE — Telephone Encounter (Signed)
Scheduled per los. Called and left msg. Mailed printout  °

## 2020-01-04 ENCOUNTER — Other Ambulatory Visit: Payer: Self-pay

## 2020-01-04 ENCOUNTER — Ambulatory Visit
Admission: RE | Admit: 2020-01-04 | Discharge: 2020-01-04 | Disposition: A | Discharge status: Home / Self Care | Payer: Medicare Other | Source: Ambulatory Visit | Attending: Radiation Oncology | Admitting: Radiation Oncology

## 2020-01-04 DIAGNOSIS — C3490 Malignant neoplasm of unspecified part of unspecified bronchus or lung: Secondary | ICD-10-CM | POA: Diagnosis not present

## 2020-01-04 DIAGNOSIS — C7931 Secondary malignant neoplasm of brain: Secondary | ICD-10-CM

## 2020-01-04 MED ORDER — HEPARIN SOD (PORK) LOCK FLUSH 100 UNIT/ML IV SOLN: 500.0000 [IU] | Freq: Once | INTRAVENOUS | Status: DC

## 2020-01-04 MED ORDER — GADOBENATE DIMEGLUMINE 529 MG/ML IV SOLN
20.0000 mL | Freq: Once | INTRAVENOUS | Status: AC | PRN
  Administered 2020-01-04: 20 mL via INTRAVENOUS

## 2020-01-04 MED ORDER — SODIUM CHLORIDE 0.9% FLUSH: 10.0000 mL | INTRAVENOUS | Status: DC | PRN

## 2020-01-08 ENCOUNTER — Inpatient Hospital Stay: Payer: Medicare Other

## 2020-01-09 ENCOUNTER — Encounter: Payer: Self-pay | Admitting: Urology

## 2020-01-09 ENCOUNTER — Ambulatory Visit
Admission: RE | Admit: 2020-01-09 | Discharge: 2020-01-09 | Disposition: A | Discharge status: Home / Self Care | Payer: Medicare Other | Source: Ambulatory Visit | Attending: Urology | Admitting: Urology

## 2020-01-09 DIAGNOSIS — G9389 Other specified disorders of brain: Secondary | ICD-10-CM | POA: Diagnosis not present

## 2020-01-09 DIAGNOSIS — Z923 Personal history of irradiation: Secondary | ICD-10-CM | POA: Diagnosis not present

## 2020-01-09 DIAGNOSIS — C7931 Secondary malignant neoplasm of brain: Secondary | ICD-10-CM | POA: Diagnosis not present

## 2020-01-09 DIAGNOSIS — C342 Malignant neoplasm of middle lobe, bronchus or lung: Secondary | ICD-10-CM | POA: Diagnosis not present

## 2020-01-10 ENCOUNTER — Ambulatory Visit: Admission: RE | Admit: 2020-01-10 | Payer: Medicare Other | Source: Ambulatory Visit | Admitting: Urology

## 2020-01-10 NOTE — Progress Notes (Signed)
Has armband been applied?  Yes.    Does patient have an allergy to IV contrast dye?: No.   Has patient ever received premedication for IV contrast dye?: No.   Does patient take metformin?: No.  If patient does take metformin when was the last dose: n/a  Date of lab work: 01/02/2020 BUN: 18  CR: 1.10  IV site: antecubital right, condition no redness  Has IV site been added to flowsheet?  Yes

## 2020-01-12 ENCOUNTER — Other Ambulatory Visit: Payer: Self-pay

## 2020-01-12 ENCOUNTER — Ambulatory Visit
Admission: RE | Admit: 2020-01-12 | Discharge: 2020-01-12 | Disposition: A | Discharge status: Home / Self Care | Payer: Medicare Other | Source: Ambulatory Visit | Attending: Radiation Oncology | Admitting: Radiation Oncology

## 2020-01-12 VITALS — BP 134/83 | HR 80 | Temp 97.9°F | Resp 18

## 2020-01-12 DIAGNOSIS — C7931 Secondary malignant neoplasm of brain: Secondary | ICD-10-CM | POA: Diagnosis not present

## 2020-01-12 MED ORDER — SODIUM CHLORIDE 0.9% FLUSH: 10.0000 mL | Freq: Once | INTRAVENOUS | Status: DC

## 2020-01-12 MED ORDER — SODIUM CHLORIDE 0.9% FLUSH
10.0000 mL | INTRAVENOUS | Status: AC
  Administered 2020-01-12: 10 mL via INTRAVENOUS

## 2020-01-12 NOTE — Progress Notes (Signed)
.  Radiation Oncology         [phone]) [phone] ________________________________  Name: Jordany Russett MRN: [id number]  Date: 01/09/2020  DOB: March 18, 1951  Post Treatment Note  CC: Laurey Morale, MD  Curt Bears, MD  Diagnosis:   69 yo man with a solitary 6.2 mm right parietal brain metastasis from Stage IV (T2b, N2, M1c) adenocarcinoma of the right middle lobe of the lung.     Interval Since Last Radiation:  9 months   04/07/19: SRS to a 6 mm  right parietal target treated to a prescription dose of 20 Gy in a single fraction.    Narrative:  I spoke with the patient to conduct his routine scheduled 3 month follow up visit via telephone to spare the patient unnecessary potential exposure in the healthcare setting during the current COVID-19 pandemic.  The patient was notified in advance and gave permission to proceed with this visit format.   He has recovered well from the effects of his previous radiotherapy and remains without complaints.  His visit today is to discuss the results from his follow-up MRI brain scan performed on 01/04/20 which shows a stable appearance of the treated right parietal lesion  but also noted is a new punctate, 2 mm focus of enhancement in the right thalamocapsular region, suspicious for metastasis.  He was initially on systemic chemotherapy with carboplatin, Alimta and Keytruda every 3 weeks under the care and direction of Dr. Earlie Server and completed 4 cycles.  His restaging imaging showed a good response to treatment with a decrease in the right pleural effusion, decreased mediastinal and right hilar lymphadenopathy, and improvement in the liver lesion and right middle lobe pulmonary lesion so he was transitioned to maintenance therapy with Alimta and Keytruda, beginning with cycle 5 of his treatment on 06/27/2019 which he continues to tolerate well.  His recent restaging imaging with CT C/A/P from 12/11/19 showed continued disease stability so he proceeded with cycle  #14 of maintenance treatment on 01/02/20.               On review of systems, the patient states that he is doing very well overall.  He is currently without complaints and specifically denies headaches, changes in auditory or visual acuity, nausea, vomiting, dizziness, imbalance, tremors or seizure activity.  He has not had recent fevers, chills or night sweats.  He is tolerating his systemic therapy quite well, aside from some edema in the upper and lower extremities which is improved with low dose HCTZ.  Overall, he is very pleased with his progress to date.  ALLERGIES:  has No Known Allergies.  Meds: Current Outpatient Medications  Medication Sig Dispense Refill  . cyanocobalamin (,VITAMIN B-12,) 1000 MCG/ML injection INJECT 1 ML (1,000 MCG TOTAL) INTO THE MUSCLE ONCE FOR 1 DOSE. 5 mL 1  . folic acid (FOLVITE) 1 MG tablet Take 1 tablet (1 mg total) by mouth daily. 90 tablet 1  . hydrochlorothiazide (MICROZIDE) 12.5 MG capsule Take 12.5 mg by mouth as needed.    . lidocaine-prilocaine (EMLA) cream Apply to the Port-A-Cath site 30-60 minutes before treatment 30 g 0  . Multiple Vitamin (MULTIVITAMIN WITH MINERALS) TABS tablet Take 1 tablet by mouth daily.    . sildenafil (REVATIO) 20 MG tablet TAKE AS DIRECTED 10 tablet 11  . tamsulosin (FLOMAX) 0.4 MG CAPS capsule Take 0.4 mg by mouth daily.     No current facility-administered medications for this encounter.    Physical Findings: Unable to  assess due to telephone follow-up visit format.  Lab Findings: Lab Results  Component Value Date   WBC 4.4 01/02/2020   HGB 13.4 01/02/2020   HCT 38.8 (L) 01/02/2020   MCV 97.5 01/02/2020   PLT 249 01/02/2020     Radiographic Findings: MR Brain W Wo Contrast  Result Date: 01/05/2020 CLINICAL DATA:  Metastatic lung cancer post SRS, follow-up EXAM: MRI HEAD WITHOUT AND WITH CONTRAST TECHNIQUE: Multiplanar, multiecho pulse sequences of the brain and surrounding structures were obtained without  and with intravenous contrast. CONTRAST:  61mL MULTIHANCE GADOBENATE DIMEGLUMINE 529 MG/ML IV SOLN COMPARISON:  10/05/2019 FINDINGS: Brain: Stable punctate 2 mm focus of enhancement remains present along the ventral aspect of the right postcentral gyrus (series 11, image 114). There is corresponding susceptibility reflecting chronic blood products or mineralization. There is a new punctate 2 mm focus of enhancement in the right thalamocapsular region (series 11, image 85). There is no acute infarction or intracranial hemorrhage. Few scattered small foci of T2 hyperintensity in the supratentorial white matter nonspecific but may reflect stable minor chronic microvascular ischemic changes. There is no hydrocephalus. Vascular: Major vessel flow voids at the skull base are preserved. Skull and upper cervical spine: Normal marrow signal is preserved. Sinuses/Orbits: Mild mucosal thickening.  No new orbital finding. Other: Sella is unremarkable.  Mastoid air cells are clear. IMPRESSION: Stable treated right parietal metastasis. New punctate focus of enhancement in the right thalamocapsular region suspicious for metastasis. Electronically Signed   By: Macy Mis M.D.   On: 01/05/2020 08:48   ECHOCARDIOGRAM COMPLETE  Result Date: 12/14/2019    ECHOCARDIOGRAM REPORT   Patient Name:   WYAT INFINGER [facility] Date of Exam: 12/14/2019 Medical Rec #:  [id number]             Height:       71.0 in Accession #:    [id number]            Weight:       201.5 lb Date of Birth:  06/02/1951              BSA:          2.115 m Patient Age:    69 years              BP:           136/82 mmHg Patient Gender: M                     HR:           69 bpm. Exam Location:  [place] Procedure: 2D Echo, Cardiac Doppler, Color Doppler and Strain Analysis Indications:    I51.9 LV dysfunction.  History:        Patient has prior history of Echocardiogram examinations, most                 recent 03/01/2019. Pleural effusion. Lung cancer. Brain                  metsastasis.  Sonographer:    [place] Referring Phys: [place]  1. Normal GLS -20.6 Abnormal septal motion . Left ventricular ejection fraction, by estimation, is 50 to 55%. The left ventricle has low normal function. The left ventricle has no regional wall motion abnormalities. Left ventricular diastolic parameters  are indeterminate.  2. Right ventricular systolic function is mildly reduced. The right ventricular size is mildly enlarged.  3. The mitral  valve is normal in structure. Trivial mitral valve regurgitation. No evidence of mitral stenosis.  4. The aortic valve is tricuspid. Aortic valve regurgitation is not visualized. No aortic stenosis is present.  5. The inferior vena cava is normal in size with greater than 50% respiratory variability, suggesting right atrial pressure of 3 mmHg. Comparison(s): 03/01/19 EF 55-60%. FINDINGS  Left Ventricle: Normal GLS -20.6 Abnormal septal motion. Left ventricular ejection fraction, by estimation, is 50 to 55%. The left ventricle has low normal function. The left ventricle has no regional wall motion abnormalities. The left ventricular internal cavity size was normal in size. There is no left ventricular hypertrophy. Left ventricular diastolic parameters are indeterminate. Right Ventricle: The right ventricular size is mildly enlarged. Right vetricular wall thickness was not assessed. Right ventricular systolic function is mildly reduced. Left Atrium: Left atrial size was normal in size. Right Atrium: Right atrial size was normal in size. Pericardium: There is no evidence of pericardial effusion. Mitral Valve: The mitral valve is normal in structure. There is mild thickening of the mitral valve leaflet(s). There is mild calcification of the mitral valve leaflet(s). Normal mobility of the mitral valve leaflets. Trivial mitral valve regurgitation. No evidence of mitral valve stenosis. Tricuspid Valve: The tricuspid valve is  normal in structure. Tricuspid valve regurgitation is mild . No evidence of tricuspid stenosis. Aortic Valve: The aortic valve is tricuspid. Aortic valve regurgitation is not visualized. No aortic stenosis is present. Pulmonic Valve: The pulmonic valve was normal in structure. Pulmonic valve regurgitation is not visualized. No evidence of pulmonic stenosis. Aorta: The aortic root is normal in size and structure. Venous: The inferior vena cava is normal in size with greater than 50% respiratory variability, suggesting right atrial pressure of 3 mmHg. IAS/Shunts: No atrial level shunt detected by color flow Doppler.  LEFT VENTRICLE PLAX 2D LVIDd:         5.60 cm  Diastology LVIDs:         3.60 cm  LV e' lateral:   6.75 cm/s LV PW:         1.00 cm  LV E/e' lateral: 6.7 LV IVS:        0.90 cm  LV e' medial:    6.31 cm/s LVOT diam:     2.30 cm  LV E/e' medial:  7.2 LV SV:         53 LV SV Index:   25       2D Longitudinal Strain LVOT Area:     4.15 cm 2D Strain GLS (A2C):   -24.5 %                         2D Strain GLS (A3C):   -23.0 %                         2D Strain GLS (A4C):   -14.2 %                         2D Strain GLS Avg:     -20.6 % RIGHT VENTRICLE RV Basal diam:  3.50 cm RV S prime:     13.60 cm/s TAPSE (M-mode): 2.1 cm LEFT ATRIUM             Index       RIGHT ATRIUM           Index LA diam:  3.70 cm 1.75 cm/m  RA Pressure: 3.00 mmHg LA Vol (A2C):   32.3 ml 15.27 ml/m RA Area:     14.40 cm LA Vol (A4C):   34.7 ml 16.40 ml/m RA Volume:   35.70 ml  16.88 ml/m LA Biplane Vol: 34.5 ml 16.31 ml/m  AORTIC VALVE LVOT Vmax:   67.38 cm/s LVOT Vmean:  47.575 cm/s LVOT VTI:    0.127 m  AORTA Ao Root diam: 3.60 cm Ao Asc diam:  3.50 cm MITRAL VALVE               TRICUSPID VALVE                            Estimated RAP:  3.00 mmHg  MV E velocity: 45.30 cm/s  SHUNTS MV A velocity: 56.40 cm/s  Systemic VTI:  0.13 m MV E/A ratio:  0.80        Systemic Diam: 2.30 cm Jenkins Rouge MD Electronically signed by Jenkins Rouge MD Signature Date/Time: 12/14/2019/11:23:18 AM    Final     Impression/Plan: 70. 69 yo man with a new 34mm  lesion in the right thalamocapsular region from Stage IV (T2b, N2, M1c) adenocarcinoma of the right middle lobe of the lung.    Today, Mont Dutton, RT-RT and I talked to the patient about the findings on his most recent follow up brain MRI which shows a new, 63mm lesion in the right thalamocapsular region, suspicious for metastatic disease.  His scans were recently reviewed at the multidisciplinary tumor board on 01/08/20 and the consensus recommendation is to proceed with a single fraction of SRS, similar to what he had previously. We reviewed the details and logistics of planning and treatment delivery. We reviewed the anticipated acute and late sequelae associated with radiation in this setting. The patient was encouraged to ask questions that were answered to his satisfaction.  At the conclusion of our conversation, the patient elects to proceed with the recommended single fraction SRS treatment to the new lesion in the right thalamocapsular region.  He has provided verbal consent to proceed today and will sign formal written consent at the time of his CT SIM/treatment planning visit on Friday, 01/12/2020 at 2 PM.  We will share our discussion with his neurosurgeon, Dr. Saintclair Halsted and coordinate his treatment accordingly.  The patient appears to have a good understanding of his disease and our treatment recommendation which is of curative intent and he is comfortable and in agreement with the stated plan.  Given current concerns for patient exposure during the COVID-19 pandemic, this encounter was conducted via [place] virtual meeting to allow for face to face communication. The patient was notified in advance and has given verbal consent for this type of encounter. The time spent during this encounter was 20 minutes with 50% of that time spent in counseling and/or coordination of care. The  attendants for this meeting include Jull Harral PA-C, Mont Dutton, RT-RT and patient, Zack Crager. During the encounter, Ilhan Debenedetto PA-C and Mont Dutton, RT-RT, were located at [facility] Radiation Oncology Department.  Patient, Makel Mcmann was located at home.    Nicholos Johns, PA-C

## 2020-01-14 NOTE — Progress Notes (Signed)
  Radiation Oncology         ([phone]) [phone] ________________________________  Name: Ethan Brown MRN: [id number]  Date: 01/12/2020  DOB: [birth year]  SIMULATION AND TREATMENT PLANNING NOTE    ICD-10-CM   1. Brain metastasis ([place])  C79.31     DIAGNOSIS:  69 yo gentleman with a new isolated solitary 2 mm brain metastasis  NARRATIVE:  The patient was brought to the [place].  Identity was confirmed.  All relevant records and images related to the planned course of therapy were reviewed.  The patient freely provided informed written consent to proceed with treatment after reviewing the details related to the planned course of therapy. The consent form was witnessed and verified by the simulation staff. Intravenous access was established for contrast administration. Then, the patient was set-up in a stable reproducible supine position for radiation therapy.  A relocatable thermoplastic stereotactic head frame was fabricated for precise immobilization.  CT images were obtained.  Surface markings were placed.  The CT images were loaded into the planning software and fused with the patient's targeting MRI scan.  Then the target and avoidance structures were contoured.  Treatment planning then occurred.  The radiation prescription was entered and confirmed.  I have requested 3D planning  I have requested a DVH of the following structures: Brain stem, brain, left eye, right eye, lenses, optic chiasm, target volumes, uninvolved brain, and normal tissue.    SPECIAL TREATMENT PROCEDURE:  The planned course of therapy using radiation constitutes a special treatment procedure. Special care is required in the management of this patient for the following reasons. This treatment constitutes a Special Treatment Procedure for the following reason: High dose per fraction requiring special monitoring for increased toxicities of treatment including daily imaging.  The special nature of the planned  course of radiotherapy will require increased physician supervision and oversight to ensure patient's safety with optimal treatment outcomes.  PLAN:  The patient will receive 20 Gy in 1 fraction.  ________________________________  Sheral Apley Tammi Klippel, M.D.

## 2020-01-16 NOTE — Progress Notes (Signed)
Pharmacist Chemotherapy Monitoring - Follow Up Assessment    I verify that I have reviewed each item in the below checklist:  . Regimen for the patient is scheduled for the appropriate day and plan matches scheduled date. Marland Kitchen Appropriate non-routine labs are ordered dependent on drug ordered. . If applicable, additional medications reviewed and ordered per protocol based on lifetime cumulative doses and/or treatment regimen.   Plan for follow-up and/or issues identified: No . I-vent associated with next due treatment: No . MD and/or nursing notified: No  Ethan Brown D 01/16/2020 10:59 AM

## 2020-01-18 DIAGNOSIS — C7931 Secondary malignant neoplasm of brain: Secondary | ICD-10-CM | POA: Diagnosis not present

## 2020-01-23 ENCOUNTER — Inpatient Hospital Stay: Payer: Medicare Other

## 2020-01-23 ENCOUNTER — Other Ambulatory Visit: Payer: Self-pay

## 2020-01-23 ENCOUNTER — Encounter: Payer: Self-pay | Admitting: Radiation Oncology

## 2020-01-23 ENCOUNTER — Inpatient Hospital Stay: Payer: Medicare Other | Attending: Internal Medicine

## 2020-01-23 ENCOUNTER — Inpatient Hospital Stay (HOSPITAL_BASED_OUTPATIENT_CLINIC_OR_DEPARTMENT_OTHER): Payer: Medicare Other | Admitting: Internal Medicine

## 2020-01-23 ENCOUNTER — Encounter: Payer: Self-pay | Admitting: Internal Medicine

## 2020-01-23 ENCOUNTER — Ambulatory Visit
Admission: RE | Admit: 2020-01-23 | Discharge: 2020-01-23 | Disposition: A | Discharge status: Home / Self Care | Payer: Medicare Other | Source: Ambulatory Visit | Attending: Radiation Oncology | Admitting: Radiation Oncology

## 2020-01-23 VITALS — BP 129/69 | HR 69 | Temp 97.8°F | Resp 20 | Ht 69.0 in | Wt 207.5 lb

## 2020-01-23 VITALS — BP 137/70 | HR 66 | Temp 97.6°F | Resp 20

## 2020-01-23 DIAGNOSIS — C787 Secondary malignant neoplasm of liver and intrahepatic bile duct: Secondary | ICD-10-CM | POA: Diagnosis not present

## 2020-01-23 DIAGNOSIS — C7931 Secondary malignant neoplasm of brain: Secondary | ICD-10-CM | POA: Diagnosis not present

## 2020-01-23 DIAGNOSIS — Z79899 Other long term (current) drug therapy: Secondary | ICD-10-CM | POA: Diagnosis not present

## 2020-01-23 DIAGNOSIS — C3490 Malignant neoplasm of unspecified part of unspecified bronchus or lung: Secondary | ICD-10-CM

## 2020-01-23 DIAGNOSIS — Z5112 Encounter for antineoplastic immunotherapy: Secondary | ICD-10-CM | POA: Diagnosis not present

## 2020-01-23 DIAGNOSIS — C3411 Malignant neoplasm of upper lobe, right bronchus or lung: Secondary | ICD-10-CM | POA: Insufficient documentation

## 2020-01-23 DIAGNOSIS — C782 Secondary malignant neoplasm of pleura: Secondary | ICD-10-CM | POA: Diagnosis not present

## 2020-01-23 DIAGNOSIS — C3491 Malignant neoplasm of unspecified part of right bronchus or lung: Secondary | ICD-10-CM | POA: Diagnosis not present

## 2020-01-23 DIAGNOSIS — E079 Disorder of thyroid, unspecified: Secondary | ICD-10-CM

## 2020-01-23 DIAGNOSIS — Z5111 Encounter for antineoplastic chemotherapy: Secondary | ICD-10-CM

## 2020-01-23 DIAGNOSIS — M7989 Other specified soft tissue disorders: Secondary | ICD-10-CM | POA: Diagnosis not present

## 2020-01-23 DIAGNOSIS — J910 Malignant pleural effusion: Secondary | ICD-10-CM | POA: Insufficient documentation

## 2020-01-23 LAB — CMP (CANCER CENTER ONLY)
ALT: 24 U/L (ref 0–44)
AST: 24 U/L (ref 15–41)
Albumin: 3 g/dL — ABNORMAL LOW (ref 3.5–5.0)
Alkaline Phosphatase: 64 U/L (ref 38–126)
Anion gap: 10 (ref 5–15)
BUN: 23 mg/dL (ref 8–23)
CO2: 27 mmol/L (ref 22–32)
Calcium: 8.9 mg/dL (ref 8.9–10.3)
Chloride: 105 mmol/L (ref 98–111)
Creatinine: 1.08 mg/dL (ref 0.61–1.24)
GFR, Est AFR Am: >60 mL/min (ref >60)
GFR, Estimated: >60 mL/min (ref >60)
Glucose, Bld: 102 mg/dL — ABNORMAL HIGH (ref 70–99)
Potassium: 3.6 mmol/L (ref 3.5–5.1)
Sodium: 142 mmol/L (ref 135–145)
Total Bilirubin: 0.4 mg/dL (ref 0.3–1.2)
Total Protein: 6 g/dL — ABNORMAL LOW (ref 6.5–8.1)

## 2020-01-23 LAB — CBC WITH DIFFERENTIAL (CANCER CENTER ONLY)
Abs Immature Granulocytes: 0.01 10*3/uL (ref 0.00–0.07)
Basophils Absolute: 0 10*3/uL (ref 0.0–0.1)
Basophils Relative: 1 %
Eosinophils Absolute: 0 10*3/uL (ref 0.0–0.5)
Eosinophils Relative: 1 %
HCT: 37.3 % — ABNORMAL LOW (ref 39.0–52.0)
Hemoglobin: 12.7 g/dL — ABNORMAL LOW (ref 13.0–17.0)
Immature Granulocytes: 0 %
Lymphocytes Relative: 15 %
Lymphs Abs: 0.6 10*3/uL — ABNORMAL LOW (ref 0.7–4.0)
MCH: 33.2 pg (ref 26.0–34.0)
MCHC: 34 g/dL (ref 30.0–36.0)
MCV: 97.6 fL (ref 80.0–100.0)
Monocytes Absolute: 0.3 10*3/uL (ref 0.1–1.0)
Monocytes Relative: 8 %
Neutro Abs: 3 10*3/uL (ref 1.7–7.7)
Neutrophils Relative %: 75 %
Platelet Count: 241 10*3/uL (ref 150–400)
RBC: 3.82 MIL/uL — ABNORMAL LOW (ref 4.22–5.81)
RDW: 13.2 % (ref 11.5–15.5)
WBC Count: 4 10*3/uL (ref 4.0–10.5)
nRBC: 0 % (ref 0.0–0.2)

## 2020-01-23 LAB — TSH: TSH: 1.612 u[IU]/mL (ref 0.350–4.500)

## 2020-01-23 MED ORDER — SODIUM CHLORIDE 0.9 % IV SOLN
200.0000 mg | Freq: Once | INTRAVENOUS | Status: AC
  Administered 2020-01-23: 200 mg via INTRAVENOUS
  Filled 2020-01-23: qty 8

## 2020-01-23 MED ORDER — PROCHLORPERAZINE MALEATE 10 MG PO TABS: 10.0000 mg | ORAL_TABLET | Freq: Once | ORAL | Status: DC

## 2020-01-23 MED ORDER — HEPARIN SOD (PORK) LOCK FLUSH 100 UNIT/ML IV SOLN
500.0000 [IU] | Freq: Once | INTRAVENOUS | Status: AC | PRN
  Administered 2020-01-23: 500 [IU]
  Filled 2020-01-23: qty 5

## 2020-01-23 MED ORDER — SODIUM CHLORIDE 0.9 % IV SOLN
Freq: Once | INTRAVENOUS | Status: AC
  Filled 2020-01-23: qty 250

## 2020-01-23 MED ORDER — SODIUM CHLORIDE 0.9% FLUSH
10.0000 mL | INTRAVENOUS | Status: DC | PRN
  Administered 2020-01-23: 10 mL
  Filled 2020-01-23: qty 10

## 2020-01-23 MED ORDER — SODIUM CHLORIDE 0.9 % IV SOLN
475.0000 mg/m2 | Freq: Once | INTRAVENOUS | Status: AC
  Administered 2020-01-23: 1000 mg via INTRAVENOUS
  Filled 2020-01-23: qty 40

## 2020-01-23 NOTE — CV Procedure (Signed)
  Name: Ethan Brown  MRN: [id number]  Date: 01/23/2020   DOB: 03-12-1951  Stereotactic Radiosurgery Operative Note  PRE-OPERATIVE DIAGNOSIS:  Solitary Brain Metastasis  POST-OPERATIVE DIAGNOSIS:  Solitary Brain Metastasis  PROCEDURE:  Stereotactic Radiosurgery  SURGEON:  Zyra Parrillo P, MD  NARRATIVE: The patient underwent a radiation treatment planning session in the radiation oncology simulation suite under the care of the radiation oncology physician and physicist.  I participated closely in the radiation treatment planning afterwards. The patient underwent planning CT which was fused to 3T high resolution MRI with 1 mm axial slices.  These images were fused on the planning system.  We contoured the gross target volumes and subsequently expanded this to yield the Planning Target Volume. I actively participated in the planning process.  I helped to define and review the target contours and also the contours of the optic pathway, eyes, brainstem and selected nearby organs at risk.  All the dose constraints for critical structures were reviewed and compared to AAPM Task Group 101.  The prescription dose conformity was reviewed.  I approved the plan electronically.    Accordingly, [name] was brought to the TrueBeam stereotactic radiation treatment linac and placed in the custom immobilization mask.  The patient was aligned according to the IR fiducial markers with BrainLab Exactrac, then orthogonal x-rays were used in ExacTrac with the 6DOF robotic table and the shifts were made to align the patient  [name] received stereotactic radiosurgery uneventfully.    The detailed description of the procedure is recorded in the radiation oncology procedure note.  I was present for the duration of the procedure.  DISPOSITION:  Following delivery, the patient was transported to nursing in stable condition and monitored for possible acute effects to be discharged to home in  stable condition with follow-up in one month.  [name] P, MD 01/23/2020 3:31 PM

## 2020-01-23 NOTE — Progress Notes (Signed)
[name] [phone]   [phone]  OFFICE PROGRESS NOTE  Laurey Morale, MD [place] [address]  DIAGNOSIS: Stage IV (T2b, N2, M1c) presented with right middle lobe lung mass in addition to mediastinal lymphadenopathy as well as malignant right pleural effusion with pleural-based nodules and suspicious right hepatic lesion and the brain metastasis diagnosed in July 2020.  Molecular Biomarkers: [id number] (Exon 20 insertion) 0.2% Dacomitinib,Neratinib,Osimertinib  [id number] 0.1% None    PRIOR THERAPY: None  CURRENT THERAPY: Systemic chemotherapy with carboplatin for AUC of 5, Alimta 500 mg/M2 and Keytruda 200 mg IV every 3 weeks.  First dose April 04, 2019.  Status post 14 cycles  Starting from cycle #5 the patient is on maintenance treatment with Alimta and Keytruda every 3 weeks.  INTERVAL HISTORY: Ethan Brown 69 y.o. male returns to the clinic today for follow-up visit.  The patient is feeling fine today with no concerning complaints except for mild swelling of the lower extremities and some occasional tightness of the chest when taking a deep breath.  He denied having any chest pain, cough or hemoptysis.  He denied having any fever or chills.  He has no nausea, vomiting, diarrhea or constipation.  He denied having any headache or visual changes.  He continues to tolerate his treatment with maintenance Alimta and Keytruda fairly well.  The patient is here today for evaluation before starting cycle #15.   MEDICAL HISTORY: Past Medical History:  Diagnosis Date  . Borderline hypertension   . Borderline systolic hypertension   . Brain metastasis ([place]) dx'd 01/2019  . Detached vitreous humor, left   . Dyspnea   . ED (erectile dysfunction) of organic origin   . Hyperlipidemia   . Hypertension   . Lens subluxation, left   . Lung cancer ([place]) dx'd 01/2019  . Migraine   . Migraine headache    takes PRN  Imitrex  . Pseudophakia   . TGA (transient global amnesia) 2017  . Transient global amnesia     ALLERGIES:  has No Known Allergies.  MEDICATIONS:  Current Outpatient Medications  Medication Sig Dispense Refill  . cyanocobalamin (,VITAMIN B-12,) 1000 MCG/ML injection INJECT 1 ML (1,000 MCG TOTAL) INTO THE MUSCLE ONCE FOR 1 DOSE. 5 mL 1  . folic acid (FOLVITE) 1 MG tablet Take 1 tablet (1 mg total) by mouth daily. 90 tablet 1  . hydrochlorothiazide (MICROZIDE) 12.5 MG capsule Take 12.5 mg by mouth as needed.    . lidocaine-prilocaine (EMLA) cream Apply to the Port-A-Cath site 30-60 minutes before treatment 30 g 0  . Multiple Vitamin (MULTIVITAMIN WITH MINERALS) TABS tablet Take 1 tablet by mouth daily.    . sildenafil (REVATIO) 20 MG tablet TAKE AS DIRECTED 10 tablet 11  . tamsulosin (FLOMAX) 0.4 MG CAPS capsule Take 0.4 mg by mouth daily.     No current facility-administered medications for this visit.    SURGICAL HISTORY:  Past Surgical History:  Procedure Laterality Date  . CAROTID DOPPLERS  09/2017   Mild non-obstructive disease  . CATARACT EXTRACTION, BILATERAL    . cataract, left  2018  . CHEST TUBE INSERTION Right 03/29/2019   Procedure: INSERTION PLEURAL DRAINAGE CATHETER;  Surgeon: [name], MD;  Location: [facility];  Service: Thoracic;  Laterality: Right;  . COLONOSCOPY  2016   clear, repeat in 10 yrs   . Eye surgeries     , Lens attachment.  Vitrectomy  .  FEMORAL HERNIA REPAIR    . HERNIA REPAIR    . IR THORACENTESIS ASP PLEURAL SPACE W/IMG GUIDE  02/22/2019  . IR THORACENTESIS ASP PLEURAL SPACE W/IMG GUIDE  03/13/2019  . LUMBAR LAMINECTOMY    . PLEURAL BIOPSY Right 03/29/2019   Procedure: PLEURAL BIOPSY;  Surgeon: [name], MD;  Location: [facility];  Service: Thoracic;  Laterality: Right;  . PORTACATH PLACEMENT N/A 03/29/2019   Procedure: INSERTION PORT-A-CATH;  Surgeon: [name], MD;  Location: [facility];  Service: Thoracic;  Laterality: N/A;   . retinal attachment, right    . sclearl buckle, right    . victrectomy,right    . VIDEO ASSISTED THORACOSCOPY Right 03/29/2019   Procedure: VIDEO ASSISTED THORACOSCOPY;  Surgeon: [name], MD;  [facility];  Service: Thoracic;  Laterality: Right;    REVIEW OF SYSTEMS:  A comprehensive review of systems was negative except for: Constitutional: positive for fatigue Respiratory: positive for dyspnea on exertion   PHYSICAL EXAMINATION: General appearance: alert, cooperative and no distress Head: Normocephalic, without obvious abnormality, atraumatic Neck: no adenopathy, no JVD, supple, symmetrical, trachea midline and thyroid not enlarged, symmetric, no tenderness/mass/nodules Lymph nodes: Cervical, supraclavicular, and axillary nodes normal. Resp: clear to auscultation bilaterally Back: symmetric, no curvature. ROM normal. No CVA tenderness. Cardio: regular rate and rhythm, S1, S2 normal, no murmur, click, rub or gallop GI: soft, non-tender; bowel sounds normal; no masses,  no organomegaly Extremities: edema Trace edema bilaterally  ECOG PERFORMANCE STATUS: 1 - Symptomatic but completely ambulatory  Blood pressure 129/69, pulse 69, temperature 97.8 F (36.6 C), temperature source Temporal, resp. rate 20, height '5\' 9"'  (1.753 m), weight 207 lb 8 oz (94.1 kg), SpO2 98 %.  LABORATORY DATA: Lab Results  Component Value Date   WBC 4.0 01/23/2020   HGB 12.7 (L) 01/23/2020   HCT 37.3 (L) 01/23/2020   MCV 97.6 01/23/2020   PLT 241 01/23/2020      Chemistry      Component Value Date/Time   NA 142 01/23/2020 1005   K 3.6 01/23/2020 1005   CL 105 01/23/2020 1005   CO2 27 01/23/2020 1005   BUN 23 01/23/2020 1005   CREATININE 1.08 01/23/2020 1005      Component Value Date/Time   CALCIUM 8.9 01/23/2020 1005   ALKPHOS 64 01/23/2020 1005   AST 24 01/23/2020 1005   ALT 24 01/23/2020 1005   BILITOT 0.4 01/23/2020 1005       RADIOGRAPHIC STUDIES: MR Brain W Wo  Contrast  Result Date: 01/05/2020 CLINICAL DATA:  Metastatic lung cancer post SRS, follow-up EXAM: MRI HEAD WITHOUT AND WITH CONTRAST TECHNIQUE: Multiplanar, multiecho pulse sequences of the brain and surrounding structures were obtained without and with intravenous contrast. CONTRAST:  24m MULTIHANCE GADOBENATE DIMEGLUMINE 529 MG/ML IV SOLN COMPARISON:  10/05/2019 FINDINGS: Brain: Stable punctate 2 mm focus of enhancement remains present along the ventral aspect of the right postcentral gyrus (series 11, image 114). There is corresponding susceptibility reflecting chronic blood products or mineralization. There is a new punctate 2 mm focus of enhancement in the right thalamocapsular region (series 11, image 85). There is no acute infarction or intracranial hemorrhage. Few scattered small foci of T2 hyperintensity in the supratentorial white matter nonspecific but may reflect stable minor chronic microvascular ischemic changes. There is no hydrocephalus. Vascular: Major vessel flow voids at the skull base are preserved. Skull and upper cervical spine: Normal marrow signal is preserved. Sinuses/Orbits: Mild mucosal thickening.  No new orbital  finding. Other: Sella is unremarkable.  Mastoid air cells are clear. IMPRESSION: Stable treated right parietal metastasis. New punctate focus of enhancement in the right thalamocapsular region suspicious for metastasis. Electronically Signed   By: Macy Mis M.D.   On: 01/05/2020 08:48    ASSESSMENT AND PLAN: This is a very pleasant 69 years old white male recently diagnosed with a stage IV (T2b, N2, M1C) non-small cell lung cancer, adenocarcinoma with positive EGFR mutation in exon 20 (resistant mutation) diagnosed in July 2020 and presented with extensive disease involving the right upper lobe as well as the right middle and lower lobe with extensive pleural based metastasis as well as mediastinal and hilar disease with malignant pleural effusion as well as liver and  brain metastasis. Unfortunately the patient has no actionable mutations based on the molecular studies by [employer].   The patient is currently undergoing systemic chemotherapy with carboplatin for AUC of 5, Alimta 500 mg/M2 and Keytruda 200 mg IV every 3 weeks status post 14 cycles.  Starting from cycle #5 the patient is on maintenance treatment with Alimta and Keytruda. The patient continues to tolerate his treatment well with no concerning adverse effects. I recommended for him to proceed with cycle #15 today as planned. I will see him back for follow-up visit in 3 weeks for evaluation before starting cycle #16 with repeat CT scan of the chest, abdomen pelvis for restaging of his disease. For the swelling of the lower extremities, he will continue his current treatment with hydrochlorothiazide. The patient was advised to call immediately if he has any concerning symptoms in the interval. The patient voices understanding of current disease status and treatment options and is in agreement with the current care plan. All questions were answered. The patient knows to call the clinic with any problems, questions or concerns. We can certainly see the patient much sooner if necessary.  Disclaimer: This note was dictated with voice recognition software. Similar sounding words can inadvertently be transcribed and may not be corrected upon review.

## 2020-01-23 NOTE — Patient Instructions (Signed)
[facility] Discharge Instructions for Patients Receiving Chemotherapy  Today you received the following chemotherapy agents Keytruda and Alimta  To help prevent nausea and vomiting after your treatment, we encourage you to take your nausea medication as directed.    If you develop nausea and vomiting that is not controlled by your nausea medication, call the clinic.   BELOW ARE SYMPTOMS THAT SHOULD BE REPORTED IMMEDIATELY:  *FEVER GREATER THAN 100.5 F  *CHILLS WITH OR WITHOUT FEVER  NAUSEA AND VOMITING THAT IS NOT CONTROLLED WITH YOUR NAUSEA MEDICATION  *UNUSUAL SHORTNESS OF BREATH  *UNUSUAL BRUISING OR BLEEDING  TENDERNESS IN MOUTH AND THROAT WITH OR WITHOUT PRESENCE OF ULCERS  *URINARY PROBLEMS  *BOWEL PROBLEMS  UNUSUAL RASH Items with * indicate a potential emergency and should be followed up as soon as possible.  Feel free to call the clinic should you have any questions or concerns. The clinic phone number is [phone].  Please show the [facility] at check-in to the Emergency Department and triage nurse.

## 2020-01-25 MED ORDER — HYDROCHLOROTHIAZIDE 12.5 MG PO CAPS: 12.5000 mg | ORAL_CAPSULE | Freq: Two times a day (BID) | ORAL | 2 refills | Status: DC

## 2020-01-28 NOTE — Progress Notes (Signed)
  Radiation Oncology         [phone] ________________________________  Stereotactic Treatment Procedure Note  Name: Ethan Brown MRN: [id number]  Date: 01/23/2020  DOB: May 02, 1951  SPECIAL TREATMENT PROCEDURE    ICD-10-CM   1. Brain metastasis ([place])  C79.31     3D TREATMENT PLANNING AND DOSIMETRY:  The patient's radiation plan was reviewed and approved by neurosurgery and radiation oncology prior to treatment.  It showed 3-dimensional radiation distributions overlaid onto the planning CT/MRI image set.  [facility] for the target structures as well as the organs at risk were reviewed. The documentation of the 3D plan and dosimetry are filed in the radiation oncology EMR.  NARRATIVE:  Ethan Brown was brought to the TrueBeam stereotactic radiation treatment machine and placed supine on the CT couch. The head frame was applied, and the patient was set up for stereotactic radiosurgery.  Neurosurgery was present for the set-up and delivery  SIMULATION VERIFICATION:  In the couch zero-angle position, the patient underwent Exactrac imaging using the Brainlab system with orthogonal KV images.  These were carefully aligned and repeated to confirm treatment position for each of the isocenters.  The Exactrac snap film verification was repeated at each couch angle.  PROCEDURE: Ethan Brown received stereotactic radiosurgery to the following targets: Right 2 mm thalamic target was treated using 3 Dynamic Conformal Arcs to a prescription dose of 20 Gy.  ExacTrac registration was performed for each couch angle.  The 100% isodose line was prescribed with 106.6% max hotspot.  6 MV X-rays were delivered in the flattening filter free beam mode.  STEREOTACTIC TREATMENT MANAGEMENT:  Following delivery, the patient was transported to nursing in stable condition and monitored for possible acute effects.  Vital signs were recorded BP 137/70 (BP Location: Left Arm, Patient Position:  Sitting, Cuff Size: Large)   Pulse 66   Temp 97.6 F (36.4 C)   Resp 20   SpO2 99% . The patient tolerated treatment without significant acute effects, and was discharged to home in stable condition.    PLAN: Follow-up in one month.  ________________________________  Sheral Apley. Tammi Klippel, M.D.

## 2020-02-09 ENCOUNTER — Other Ambulatory Visit: Payer: Self-pay

## 2020-02-09 ENCOUNTER — Ambulatory Visit (HOSPITAL_COMMUNITY)
Admission: RE | Admit: 2020-02-09 | Discharge: 2020-02-09 | Disposition: A | Discharge status: Home / Self Care | Payer: Medicare Other | Source: Ambulatory Visit | Attending: Internal Medicine | Admitting: Internal Medicine

## 2020-02-09 DIAGNOSIS — C3490 Malignant neoplasm of unspecified part of unspecified bronchus or lung: Secondary | ICD-10-CM

## 2020-02-09 MED ORDER — SODIUM CHLORIDE (PF) 0.9 % IJ SOLN
INTRAMUSCULAR | Status: AC
  Filled 2020-02-09: qty 50

## 2020-02-09 MED ORDER — IOHEXOL 300 MG/ML  SOLN
100.0000 mL | Freq: Once | INTRAMUSCULAR | Status: AC
  Administered 2020-02-09: 100 mL via INTRAVENOUS

## 2020-02-11 NOTE — Progress Notes (Signed)
[facility] OFFICE PROGRESS NOTE  Laurey Morale, MD [place] [address]  DIAGNOSIS: Stage IV (T2b, N2, M1c)presented with right middle lobe lung mass in addition to mediastinal lymphadenopathy as well as malignant right pleural effusion with pleural-based nodules and suspicious right hepatic lesionand the brain metastasisdiagnosed in July 2020.  Molecular Biomarkers: [id number] (Exon 20 insertion) 0.2% Dacomitinib,Neratinib,Osimertinib  [id number] 0.1% None  PRIOR THERAPY: 1) SRS treatment to the metastatic disease to the brain under the care of Dr. Tammi Klippel. Completed on 04/07/2019. 2) SRS to the suspicious small brain lesion in the thalamocapsular region 01/23/2020 under the care of Dr. Tammi Klippel  CURRENT THERAPY: Systemic chemotherapy with carboplatin for AUC of 5, Alimta 500 mg/M2 and Keytruda 200 mg IV every 3 weeks. First dose April 04, 2019.Status post 15 cycles.Starting from cycle #5 he has been on maintenance Alimta and Keytruda every 3 weeks.   INTERVAL HISTORY: Ethan Brown 69 y.o. male returns to the clinic for a follow up visit. The patient is feeling fair today but he reports annual allergies to hay fever with an associated cough and mucus. He states this has been occurring for 6-8 weeks or so. He takes tussinex and allergy medication. Denies fevers. His chest is sore from coughing.   Otherwise, the patient continues to tolerate treatment with maintenance Alimta and keytruda well without any adverse effects. He recently had SRS treatment to a new small 2 mm lesion. Denies any chills, night sweats, or significant weight loss. Denies any hemoptysis. He has some chest tightness with inspiration and mild dyspnea on exertion. Denies any nausea, vomiting, diarrhea, or constipation but reports an uneasy feeling in his stomach following treatment. Denies any rashes or skin changes. He recently had a restaging CT scan. The  patient is here today for evaluation and to review his scan prior to starting cycle # 16   MEDICAL HISTORY: Past Medical History:  Diagnosis Date  . Borderline hypertension   . Borderline systolic hypertension   . Brain metastasis ([facility]) dx'd 01/2019  . Detached vitreous humor, left   . Dyspnea   . ED (erectile dysfunction) of organic origin   . Hyperlipidemia   . Hypertension   . Lens subluxation, left   . Lung cancer ([facility]) dx'd 01/2019  . Migraine   . Migraine headache    takes PRN Imitrex  . Pseudophakia   . TGA (transient global amnesia) 2017  . Transient global amnesia     ALLERGIES:  has No Known Allergies.  MEDICATIONS:  Current Outpatient Medications  Medication Sig Dispense Refill  . cyanocobalamin (,VITAMIN B-12,) 1000 MCG/ML injection INJECT 1 ML (1,000 MCG TOTAL) INTO THE MUSCLE ONCE FOR 1 DOSE. 5 mL 1  . folic acid (FOLVITE) 1 MG tablet Take 1 tablet (1 mg total) by mouth daily. 90 tablet 1  . hydrochlorothiazide (MICROZIDE) 12.5 MG capsule Take 1 capsule (12.5 mg total) by mouth 2 (two) times daily. 180 capsule 2  . lidocaine-prilocaine (EMLA) cream Apply to the Port-A-Cath site 30-60 minutes before treatment 30 g 0  . Multiple Vitamin (MULTIVITAMIN WITH MINERALS) TABS tablet Take 1 tablet by mouth daily.    . sildenafil (REVATIO) 20 MG tablet TAKE AS DIRECTED 10 tablet 11  . tamsulosin (FLOMAX) 0.4 MG CAPS capsule Take 0.4 mg by mouth daily.     No current facility-administered medications for this visit.    SURGICAL HISTORY:  Past Surgical History:  Procedure Laterality Date  . CAROTID DOPPLERS  09/2017   Mild non-obstructive disease  . CATARACT EXTRACTION, BILATERAL    . cataract, left  2018  . CHEST TUBE INSERTION Right 03/29/2019   Procedure: INSERTION PLEURAL DRAINAGE CATHETER;  Surgeon: [name], MD;  Location: [facility];  Service: Thoracic;  Laterality: Right;  . COLONOSCOPY  2016   clear, repeat in 10 yrs   . Eye surgeries     , Lens  attachment.  Vitrectomy  . FEMORAL HERNIA REPAIR    . HERNIA REPAIR    . IR THORACENTESIS ASP PLEURAL SPACE W/IMG GUIDE  02/22/2019  . IR THORACENTESIS ASP PLEURAL SPACE W/IMG GUIDE  03/13/2019  . LUMBAR LAMINECTOMY    . PLEURAL BIOPSY Right 03/29/2019   Procedure: PLEURAL BIOPSY;  Surgeon: [name], MD;  Location: [facility];  Service: Thoracic;  Laterality: Right;  . PORTACATH PLACEMENT N/A 03/29/2019   Procedure: INSERTION PORT-A-CATH;  Surgeon: [name], MD;  Location: [facility];  Service: Thoracic;  Laterality: N/A;  . retinal attachment, right    . sclearl buckle, right    . victrectomy,right    . VIDEO ASSISTED THORACOSCOPY Right 03/29/2019   Procedure: VIDEO ASSISTED THORACOSCOPY;  Surgeon: [name], MD;  Location: [facility] OR;  Service: Thoracic;  Laterality: Right;    REVIEW OF SYSTEMS:   Review of Systems  Constitutional: Negative for appetite change, chills, fatigue, fever and unexpected weight change.  HENT: Negative for mouth sores, nosebleeds, sore throat and trouble swallowing.   Eyes: Negative for eye problems and icterus.  Respiratory: Positive for cough and mild dyspnea on exertion. Negative for hemoptysis and wheezing.   Cardiovascular: Chest soreness secondary to coughing. Negative for leg swelling.  Gastrointestinal: Negative for abdominal pain, constipation, diarrhea, nausea and vomiting.  Genitourinary: Negative for bladder incontinence, difficulty urinating, dysuria, frequency and hematuria.   Musculoskeletal: Negative for back pain, gait problem, neck pain and neck stiffness.  Skin: Negative for itching and rash.  Neurological: Negative for dizziness, extremity weakness, gait problem, headaches, light-headedness and seizures.  Hematological: Negative for adenopathy. Does not bruise/bleed easily.  Psychiatric/Behavioral: Negative for confusion, depression and sleep disturbance. The patient is not nervous/anxious.     PHYSICAL EXAMINATION:   There were no vitals taken for this visit.  ECOG PERFORMANCE STATUS: 1 - Symptomatic but completely ambulatory  Physical Exam  Constitutional: Oriented to person, place, and time and well-developed, well-nourished, and in no distress.  HENT:  Head: Normocephalic and atraumatic.  Mouth/Throat: Oropharynx is clear and moist. No oropharyngeal exudate.  Eyes: Conjunctivae are normal. Right eye exhibits no discharge. Left eye exhibits no discharge. No scleral icterus.  Neck: Normal range of motion. Neck supple.  Cardiovascular: Normal rate, regular rhythm, normal heart sounds and intact distal pulses.   Pulmonary/Chest: Effort normal and breath sounds normal. No respiratory distress. No wheezes. No rales.  Abdominal: Soft. Bowel sounds are normal. Exhibits no distension and no mass. There is no tenderness.  Musculoskeletal: Normal range of motion. Exhibits no edema.  Lymphadenopathy:    No cervical adenopathy.  Neurological: Alert and oriented to person, place, and time. Exhibits normal muscle tone. Gait normal. Coordination normal.  Skin: Skin is warm and dry. No rash noted. Not diaphoretic. No erythema. No pallor.  Psychiatric: Mood, memory and judgment normal.  Vitals reviewed.  LABORATORY DATA: Lab Results  Component Value Date   WBC 4.0 01/23/2020   HGB 12.7 (L) 01/23/2020   HCT 37.3 (L) 01/23/2020   MCV 97.6 01/23/2020   PLT 241  01/23/2020      Chemistry      Component Value Date/Time   NA 142 01/23/2020 1005   K 3.6 01/23/2020 1005   CL 105 01/23/2020 1005   CO2 27 01/23/2020 1005   BUN 23 01/23/2020 1005   CREATININE 1.08 01/23/2020 1005      Component Value Date/Time   CALCIUM 8.9 01/23/2020 1005   ALKPHOS 64 01/23/2020 1005   AST 24 01/23/2020 1005   ALT 24 01/23/2020 1005   BILITOT 0.4 01/23/2020 1005       RADIOGRAPHIC STUDIES:  No results found.   ASSESSMENT/PLAN:  This isa very pleasant 69 year old Caucasian male who was diagnosed with stage IV  non-small cell lung cancer, adenocarcinoma with a positive EGFR resistant mutation in exon 20. He may be eligible for targeted therapy in the future if he progresses on his current treatment. He presented with extensive disease involving the right upper lobe, right middle lobe, and right lower lobe with extensive pleural-based metastases. He also has mediastinal and hilar adenopathy and a rightmalignant pleural effusion. He also has metastatic disease to the brain and a suspicious liver lesion.  He completed SRS treatment to the metastatic disease to the brain under care of Dr. Tammi Klippel.   He had a pleurex catheter placed under the care of Dr. Roxan Hockey for his malignant pleural effusion. This was removed 07/11/2019  He is currently undergoing systemic chemotherapy with carboplatin AUC of 5, Alimta 500 mg/m, and Keytruda 200 mg IV every 3 weeks. He is status post 15 cycles. Starting from cycle #5, he has been on maintenance Alimta 500 mg/m2 and Keytruda 200 mg IV every 3 weeks.   He recently had SRS treatment to the another suspicious brain lesion on 01/23/20 under the care of Dr. Tammi Klippel.  He recently had a restaging CT scan. Dr. Julien Nordmann personally and independently reviewed the scan and discussed the results with the patient. The scan showed no evidence of disease progression. Dr. Julien Nordmann recommends continuing on the same treatment at the same dose.   Dr. Julien Nordmann recommends he proceed with cycle #16 today as scheduled.   The scan noted slightly increased confluent volume loss and opacity inferiorly in the right lower lobe, probably mostly from atelectasis, although a component of pneumonia is not excluded. Given his cough, we will send in Levaquin 500 mg for 7 days to his pharmacy.   We will see him back for a follow up visit in 3 weeks for evaluation before starting cycle #17.   The patient was advised to call immediately if he has any concerning symptoms in the interval. The patient  voices understanding of current disease status and treatment options and is in agreement with the current care plan. All questions were answered. The patient knows to call the clinic with any problems, questions or concerns. We can certainly see the patient much sooner if necessary  No orders of the defined types were placed in this encounter.    Ethan Brown L Voncille Simm, PA-C 02/11/20  ADDENDUM: Hematology/Oncology Attending: I had a face-to-face encounter with the patient today.  I recommended his care plan.  This is a very pleasant 69 years old white male with a stage IV non-small cell lung cancer, adenocarcinoma with positive EGFR mutation but in exon 20. Diagnosed in July 2020.  The patient is status post stereotactic radiotherapy to brain lesion.  He started systemic chemotherapy with induction chemotherapy with carboplatin, Alimta and Keytruda status post four cycles and he is currently on maintenance  treatment with Alimta and Keytruda every 3 weeks status post fifteen cycles. The patient continues to tolerate his treatment well with no concerning adverse effects.  He has dry cough secondary to allergy.  The patient had repeat CT scan of the chest, abdomen pelvis performed recently.  I personally and independently reviewed the scans and discussed the results with the patient today. His scan showed no concerning findings for disease progression. I recommended for the patient to continue his current treatment with maintenance Alimta and Keytruda. The patient has question about the new approval of Amivantamab which was recently approved for patient with EGFR mutation in exon 20. I explained to the patient that we will continue his current treatment with the maintenance Alimta and Keytruda for now and if he develop any disease progression or intolerance in the future we will consider him for the treatment with Amivantamab or any other drug approved for exon 20 mutations. He agreed to the  current plan. The patient will come back for follow-up visit in 3 weeks for evaluation before the next cycle of his treatment. The patient was advised to call immediately if he has any other concerning symptoms in the interval.  Disclaimer: This note was dictated with voice recognition software. Similar sounding words can inadvertently be transcribed and may be missed upon review. Eilleen Kempf, MD 02/13/20

## 2020-02-13 ENCOUNTER — Inpatient Hospital Stay (HOSPITAL_BASED_OUTPATIENT_CLINIC_OR_DEPARTMENT_OTHER): Payer: Medicare Other | Admitting: Physician Assistant

## 2020-02-13 ENCOUNTER — Inpatient Hospital Stay: Payer: Medicare Other

## 2020-02-13 ENCOUNTER — Encounter: Payer: Self-pay | Admitting: Physician Assistant

## 2020-02-13 ENCOUNTER — Other Ambulatory Visit: Payer: Self-pay

## 2020-02-13 VITALS — BP 127/66 | HR 72 | Temp 97.5°F | Resp 18 | Ht 69.0 in | Wt 204.5 lb

## 2020-02-13 DIAGNOSIS — R05 Cough: Secondary | ICD-10-CM

## 2020-02-13 DIAGNOSIS — Z5111 Encounter for antineoplastic chemotherapy: Secondary | ICD-10-CM

## 2020-02-13 DIAGNOSIS — Z5112 Encounter for antineoplastic immunotherapy: Secondary | ICD-10-CM | POA: Diagnosis not present

## 2020-02-13 DIAGNOSIS — C3491 Malignant neoplasm of unspecified part of right bronchus or lung: Secondary | ICD-10-CM

## 2020-02-13 DIAGNOSIS — R059 Cough, unspecified: Secondary | ICD-10-CM

## 2020-02-13 DIAGNOSIS — E079 Disorder of thyroid, unspecified: Secondary | ICD-10-CM

## 2020-02-13 DIAGNOSIS — Z95828 Presence of other vascular implants and grafts: Secondary | ICD-10-CM

## 2020-02-13 LAB — CBC WITH DIFFERENTIAL (CANCER CENTER ONLY)
Abs Immature Granulocytes: 0.01 10*3/uL (ref 0.00–0.07)
Basophils Absolute: 0 10*3/uL (ref 0.0–0.1)
Basophils Relative: 1 %
Eosinophils Absolute: 0.1 10*3/uL (ref 0.0–0.5)
Eosinophils Relative: 3 %
HCT: 38.6 % — ABNORMAL LOW (ref 39.0–52.0)
Hemoglobin: 13.2 g/dL (ref 13.0–17.0)
Immature Granulocytes: 0 %
Lymphocytes Relative: 17 %
Lymphs Abs: 0.8 10*3/uL (ref 0.7–4.0)
MCH: 33.6 pg (ref 26.0–34.0)
MCHC: 34.2 g/dL (ref 30.0–36.0)
MCV: 98.2 fL (ref 80.0–100.0)
Monocytes Absolute: 0.5 10*3/uL (ref 0.1–1.0)
Monocytes Relative: 11 %
Neutro Abs: 3.3 10*3/uL (ref 1.7–7.7)
Neutrophils Relative %: 68 %
Platelet Count: 256 10*3/uL (ref 150–400)
RBC: 3.93 MIL/uL — ABNORMAL LOW (ref 4.22–5.81)
RDW: 12.6 % (ref 11.5–15.5)
WBC Count: 4.8 10*3/uL (ref 4.0–10.5)
nRBC: 0 % (ref 0.0–0.2)

## 2020-02-13 LAB — CMP (CANCER CENTER ONLY)
ALT: 20 U/L (ref 0–44)
AST: 25 U/L (ref 15–41)
Albumin: 2.9 g/dL — ABNORMAL LOW (ref 3.5–5.0)
Alkaline Phosphatase: 74 U/L (ref 38–126)
Anion gap: 8 (ref 5–15)
BUN: 23 mg/dL (ref 8–23)
CO2: 27 mmol/L (ref 22–32)
Calcium: 8.9 mg/dL (ref 8.9–10.3)
Chloride: 104 mmol/L (ref 98–111)
Creatinine: 1.08 mg/dL (ref 0.61–1.24)
GFR, Est AFR Am: >60 mL/min (ref >60)
GFR, Estimated: >60 mL/min (ref >60)
Glucose, Bld: 83 mg/dL (ref 70–99)
Potassium: 3.6 mmol/L (ref 3.5–5.1)
Sodium: 139 mmol/L (ref 135–145)
Total Bilirubin: 0.5 mg/dL (ref 0.3–1.2)
Total Protein: 6.3 g/dL — ABNORMAL LOW (ref 6.5–8.1)

## 2020-02-13 LAB — TSH: TSH: 1.694 u[IU]/mL (ref 0.320–4.118)

## 2020-02-13 MED ORDER — HEPARIN SOD (PORK) LOCK FLUSH 100 UNIT/ML IV SOLN
500.0000 [IU] | Freq: Once | INTRAVENOUS | Status: AC | PRN
  Administered 2020-02-13: 500 [IU]
  Filled 2020-02-13: qty 5

## 2020-02-13 MED ORDER — PROCHLORPERAZINE MALEATE 10 MG PO TABS: 10.0000 mg | ORAL_TABLET | Freq: Once | ORAL | Status: DC

## 2020-02-13 MED ORDER — PROCHLORPERAZINE MALEATE 10 MG PO TABS
ORAL_TABLET | ORAL | Status: AC
  Filled 2020-02-13: qty 1

## 2020-02-13 MED ORDER — LEVOFLOXACIN 500 MG PO TABS: 500.0000 mg | ORAL_TABLET | Freq: Every day | ORAL | 0 refills | Status: DC

## 2020-02-13 MED ORDER — SODIUM CHLORIDE 0.9 % IV SOLN
Freq: Once | INTRAVENOUS | Status: AC
  Filled 2020-02-13: qty 250

## 2020-02-13 MED ORDER — SODIUM CHLORIDE 0.9% FLUSH
10.0000 mL | INTRAVENOUS | Status: DC | PRN
  Administered 2020-02-13: 10 mL
  Filled 2020-02-13: qty 10

## 2020-02-13 MED ORDER — SODIUM CHLORIDE 0.9 % IV SOLN
475.0000 mg/m2 | Freq: Once | INTRAVENOUS | Status: AC
  Administered 2020-02-13: 1000 mg via INTRAVENOUS
  Filled 2020-02-13: qty 40

## 2020-02-13 MED ORDER — SODIUM CHLORIDE 0.9 % IV SOLN
200.0000 mg | Freq: Once | INTRAVENOUS | Status: AC
  Administered 2020-02-13: 200 mg via INTRAVENOUS
  Filled 2020-02-13: qty 8

## 2020-02-13 NOTE — Progress Notes (Signed)
[place] and HIPAA documents provided by patient and were sent to be scanned to [facility].

## 2020-02-13 NOTE — Patient Instructions (Signed)
[facility] Discharge Instructions for Patients Receiving Chemotherapy  Today you received the following chemotherapy agents: pemetrexed and pembrolizumab.  To help prevent nausea and vomiting after your treatment, we encourage you to take your nausea medication as directed.   If you develop nausea and vomiting that is not controlled by your nausea medication, call the clinic.   BELOW ARE SYMPTOMS THAT SHOULD BE REPORTED IMMEDIATELY:  *FEVER GREATER THAN 100.5 F  *CHILLS WITH OR WITHOUT FEVER  NAUSEA AND VOMITING THAT IS NOT CONTROLLED WITH YOUR NAUSEA MEDICATION  *UNUSUAL SHORTNESS OF BREATH  *UNUSUAL BRUISING OR BLEEDING  TENDERNESS IN MOUTH AND THROAT WITH OR WITHOUT PRESENCE OF ULCERS  *URINARY PROBLEMS  *BOWEL PROBLEMS  UNUSUAL RASH Items with * indicate a potential emergency and should be followed up as soon as possible.  Feel free to call the clinic should you have any questions or concerns. The clinic phone number is [phone].  Please show the [facility] at check-in to the Emergency Department and triage nurse.

## 2020-02-22 ENCOUNTER — Other Ambulatory Visit: Payer: Self-pay

## 2020-02-22 ENCOUNTER — Encounter: Payer: Self-pay | Admitting: Cardiology

## 2020-02-22 ENCOUNTER — Ambulatory Visit (INDEPENDENT_AMBULATORY_CARE_PROVIDER_SITE_OTHER): Payer: Medicare Other | Admitting: Cardiology

## 2020-02-22 VITALS — BP 130/78 | HR 81 | Temp 97.5°F | Ht 71.0 in | Wt 202.8 lb

## 2020-02-22 DIAGNOSIS — R600 Localized edema: Secondary | ICD-10-CM

## 2020-02-22 DIAGNOSIS — R030 Elevated blood-pressure reading, without diagnosis of hypertension: Secondary | ICD-10-CM | POA: Diagnosis not present

## 2020-02-22 NOTE — Patient Instructions (Signed)
Medication Instructions:   Continue with  HCTZ No other changes *If you need a refill on your cardiac medications before your next appointment, please call your pharmacy*   Lab Work: Not needed   Testing/Procedures: Not needed   Follow-Up: At [facility], you and your health needs are our priority.  As part of our continuing mission to provide you with exceptional heart care, we have created designated Provider Care Teams.  These Care Teams include your primary Cardiologist (physician) and Advanced Practice Providers (APPs -  Physician Assistants and Nurse Practitioners) who all work together to provide you with the care you need, when you need it.  We recommend signing up for the patient portal called "MyChart".  Sign up information is provided on this After Visit Summary.  MyChart is used to connect with patients for Virtual Visits (Telemedicine).  Patients are able to view lab/test results, encounter notes, upcoming appointments, etc.  Non-urgent messages can be sent to your provider as well.   To learn more about what you can do with MyChart, go to [email or web address].    Your next appointment:   6 month(s)  The format for your next appointment:   In Person  Provider:   Glenetta Hew, MD   Other Instructions  purchase Compression stocking --  10-20 mmhg  Elevated legs  When sitting

## 2020-02-22 NOTE — Progress Notes (Signed)
Primary Care Provider: Laurey Morale, MD Cardiologist: No primary care provider on file. Electrophysiologist: None  Clinic Note: Chief Complaint  Patient presents with   Follow-up    Annual    HPI:    Ethan Brown is a 69 y.o. male with a PMH notable for Transient Global Amnesia, hypertension, hyperlipidemia as well as stage IV lung cancer on maintenance chemotherapy below who presents today for annual follow-up.  Ethan Brown was last seen on June 29th 2020 shortly after his diagnosis of lung cancer (adeno CA) he was getting ready for bronchoscopy and thoracentesis.  Had some mild exertional dyspnea but was exercising despite every day.  Walks 30 to 40 minutes and will also ride his bike.  Rare palpitations.  No edema.  Recent Hospitalizations: None  Reviewed  CV studies:    The following studies were reviewed today: (if available, images/films reviewed: From Epic Chart or Care Everywhere)  ECHO-December 14, 2019: EF 50 EF 55% (low normal).  Abnormal septal wall motion related to bundle branch block) indeterminate diastolic parameters.  Mild RV enlargement with mildly reduced function.  Normal valves.  Normal atrial sizes.   Interval History:   Ethan Brown returns here today for cardiology follow-up without any major cardiac complaints.  He had an echo checked back in April because he was having some pretty significant edema worse at the end of the day than the morning.  He says his current level of swelling is much better than it had been.  He still says he walks constantly, every day, and when not walking he rides his bike most days.  He is trying to everything he can to not let his cancer get the better of him. He may get some short of breath if he overdoes it, but not usually.  No chest pain or pressure.  Despite having edema, he does not have any PND or orthopnea.  CV Review of Symptoms (Summary) Cardiovascular ROS: no chest pain or dyspnea on  exertion positive for - edema and palpitations negative for - chest pain, orthopnea, paroxysmal nocturnal dyspnea, rapid heart rate, shortness of breath or Syncope/near syncope, TIA/amaurosis fugax, claudication  The patient does not have symptoms concerning for COVID-19 infection (fever, chills, cough, or new shortness of breath).  The patient is practicing social distancing & Masking.    REVIEWED OF SYSTEMS   Review of Systems  Constitutional: Positive for malaise/fatigue (Off and on especially related to chemotherapy) and weight loss (A little bit each month).  HENT: Negative for congestion and nosebleeds.   Respiratory: Positive for cough (Frequent morning cough with multiple coughing spells during exam). Negative for sputum production.   Cardiovascular: Positive for leg swelling (Much better now than it had been to 3 months ago.). Negative for claudication.  Gastrointestinal: Negative for blood in stool and melena.       Has GI symptoms with his chemotherapy but otherwise not  Genitourinary: Negative for hematuria.  Musculoskeletal: Negative for falls and joint pain.  Neurological: Positive for dizziness (Sometimes if he stands up too quickly) and weakness (Can feel generalized weak when he is having chemo). Negative for tingling and headaches.  Psychiatric/Behavioral: Negative for depression (Actually seems to be taking all this in good spirits.) and memory loss. The patient is not nervous/anxious.        A little down because of having terminal cancer, but does not seem overly depressed.  Still stays active    I have reviewed and (if  needed) personally updated the patient's problem list, medications, allergies, past medical and surgical history, social and family history.   PAST MEDICAL HISTORY   Past Medical History:  Diagnosis Date   Borderline hypertension    Brain metastasis ([place]) dx'd 01/2019   Detached vitreous humor, left    Dyspnea    ED (erectile dysfunction) of  organic origin    Hyperlipidemia    Hypertension    Lens subluxation, left    Lung cancer ([place]) dx'd 01/2019   Stage IV   Migraine headache    takes PRN Imitrex   Pseudophakia    TGA (transient global amnesia) 2017    PAST SURGICAL HISTORY   Past Surgical History:  Procedure Laterality Date   CAROTID DOPPLERS  09/2017   Mild non-obstructive disease   CATARACT EXTRACTION, BILATERAL     cataract, left  2018   CHEST TUBE INSERTION Right 03/29/2019   Procedure: INSERTION PLEURAL DRAINAGE CATHETER;  Surgeon: [name], MD;  Location: [facility];  Service: Thoracic;  Laterality: Right;   COLONOSCOPY  2016   clear, repeat in 10 yrs    Eye surgeries     , Lens attachment.  Vitrectomy   FEMORAL HERNIA REPAIR     HERNIA REPAIR     IR THORACENTESIS ASP PLEURAL SPACE W/IMG GUIDE  02/22/2019   IR THORACENTESIS ASP PLEURAL SPACE W/IMG GUIDE  03/13/2019   LUMBAR LAMINECTOMY     PLEURAL BIOPSY Right 03/29/2019   Procedure: PLEURAL BIOPSY;  Surgeon: [name], MD;  Location: [facility];  Service: Thoracic;  Laterality: Right;   PORTACATH PLACEMENT N/A 03/29/2019   Procedure: INSERTION PORT-A-CATH;  Surgeon: [name], MD;  Location: [facility];  Service: Thoracic;  Laterality: N/A;   retinal attachment, right     sclearl buckle, right     victrectomy,right     VIDEO ASSISTED THORACOSCOPY Right 03/29/2019   Procedure: VIDEO ASSISTED THORACOSCOPY;  Surgeon: [name], MD;  Location: [facility];  Service: Thoracic;  Laterality: Right;    MEDICATIONS/ALLERGIES   Current Meds  Medication Sig   cyanocobalamin (,VITAMIN B-12,) 1000 MCG/ML injection INJECT 1 ML (1,000 MCG TOTAL) INTO THE MUSCLE ONCE FOR 1 DOSE.   folic acid (FOLVITE) 1 MG tablet Take 1 tablet (1 mg total) by mouth daily.   hydrochlorothiazide (MICROZIDE) 12.5 MG capsule Take 1 capsule (12.5 mg total) by mouth 2 (two) times daily.   levofloxacin (LEVAQUIN) 500 MG tablet Take 1 tablet  (500 mg total) by mouth daily.   lidocaine-prilocaine (EMLA) cream Apply to the Port-A-Cath site 30-60 minutes before treatment   Multiple Vitamin (MULTIVITAMIN WITH MINERALS) TABS tablet Take 1 tablet by mouth daily.   sildenafil (REVATIO) 20 MG tablet TAKE AS DIRECTED   tamsulosin (FLOMAX) 0.4 MG CAPS capsule Take 0.4 mg by mouth daily.    No Known Allergies  SOCIAL HISTORY/FAMILY HISTORY   Reviewed in Epic:  Pertinent findings: n/a  OBJCTIVE -PE, EKG, labs   Wt Readings from Last 3 Encounters:  02/22/20 202 lb 12.8 oz (92 kg)  02/13/20 204 lb 8 oz (92.8 kg)  01/23/20 207 lb 8 oz (94.1 kg)    Physical Exam: BP 130/78    Pulse 81    Temp (!) 97.5 F (36.4 C)    Ht 5\' 11"  (1.803 m)    Wt 202 lb 12.8 oz (92 kg)    SpO2 98%    BMI 28.28 kg/m  Physical Exam Constitutional:  General: He is not in acute distress.    Appearance: Normal appearance. He is normal weight. He is ill-appearing (Does appear somewhat chronically ill and fatigued.). He is not toxic-appearing.  HENT:     Head: Normocephalic and atraumatic.  Neck:     Vascular: JVD (Trivial, 7 cm) present. No hepatojugular reflux.  Cardiovascular:     Rate and Rhythm: Normal rate and regular rhythm.  No extrasystoles are present.    Chest Wall: PMI is not displaced.     Pulses: Intact distal pulses.     Heart sounds: S1 normal and S2 normal. Heart sounds are distant. No murmur heard.  No gallop.   Pulmonary:     Effort: Pulmonary effort is normal. No respiratory distress.     Breath sounds: Rhonchi (Mild basal rhonchi) present. No rales.  Musculoskeletal:     Right lower leg: Edema (2+) present.     Left lower leg: Edema (3+) present.  Neurological:     General: No focal deficit present.     Mental Status: He is alert and oriented to person, place, and time.  Psychiatric:        Mood and Affect: Mood normal.        Behavior: Behavior normal.        Thought Content: Thought content normal.        Judgment:  Judgment normal.      Adult ECG Report  Rate: 81 ;  Rhythm: normal sinus rhythm and LAFB, incomplete LBBB;   Narrative Interpretation: More pronounced QRS broadening  Recent Labs: Probably is due for labs to be checked by PCP Lab Results  Component Value Date   CHOL 169 08/30/2018   HDL 41.40 08/30/2018   LDLCALC 104 (H) 08/30/2018   TRIG 115.0 08/30/2018   CHOLHDL 4 08/30/2018   Lab Results  Component Value Date   CREATININE 1.08 02/13/2020   BUN 23 02/13/2020   NA 139 02/13/2020   K 3.6 02/13/2020   CL 104 02/13/2020   CO2 27 02/13/2020   Lab Results  Component Value Date   TSH 1.694 02/13/2020    ASSESSMENT/PLAN    Problem List Items Addressed This Visit    Borderline hypertension - Primary (Chronic)    Blood pressure looks pretty good on very low-dose HCTZ.  Probably more there because of edema and then and actually blood pressure.      Relevant Orders   EKG 12-Lead (Completed)   Bilateral lower extremity edema (Chronic)    He is reluctant to take a true diuretic.  Seems to doing relatively well with HCTZ stated his edema is much better than it had been.  It usually is better in the morning.  Plan: Discussed support stockings (he does not like wearing them in the summertime because of too hot) and foot elevation      Relevant Orders   EKG 12-Lead (Completed)       COVID-19 Education: The signs and symptoms of COVID-19 were discussed with the patient and how to seek care for testing (follow up with PCP or arrange E-visit).   The importance of social distancing and COVID-19 vaccination was discussed today.  I spent a total of 51minutes with the patient. >  50% of the time was spent in direct patient consultation.  Additional time spent with chart review  / charting (studies, outside notes, etc): 5 Total Time: 21 min   Current medicines are reviewed at length with the patient today.  (+/- concerns) n/a  Notice: This dictation was prepared with Dragon  dictation along with smaller phrase technology. Any transcriptional errors that result from this process are unintentional and may not be corrected upon review.  Patient Instructions / Medication Changes & Studies & Tests Ordered   Patient Instructions  Medication Instructions:   Continue with  HCTZ No other changes *If you need a refill on your cardiac medications before your next appointment, please call your pharmacy*   Lab Work: Not needed   Testing/Procedures: Not needed   Follow-Up: At [facility], you and your health needs are our priority.  As part of our continuing mission to provide you with exceptional heart care, we have created designated Provider Care Teams.  These Care Teams include your primary Cardiologist (physician) and Advanced Practice Providers (APPs -  Physician Assistants and Nurse Practitioners) who all work together to provide you with the care you need, when you need it.  We recommend signing up for the patient portal called "MyChart".  Sign up information is provided on this After Visit Summary.  MyChart is used to connect with patients for Virtual Visits (Telemedicine).  Patients are able to view lab/test results, encounter notes, upcoming appointments, etc.  Non-urgent messages can be sent to your provider as well.   To learn more about what you can do with MyChart, go to [email or web address].    Your next appointment:   6 month(s)  The format for your next appointment:   In Person  Provider:   Glenetta Hew, MD   Other Instructions  purchase Compression stocking --  10-20 mmhg  Elevated legs  When sitting    Studies Ordered:   Orders Placed This Encounter  Procedures   EKG 12-Lead     Glenetta Hew, M.D., M.S. Interventional Cardiologist   Pager # [phone] Phone # [phone] [address] [place], [place] [address]   Thank you for choosing Heartcare at [facility]!!

## 2020-02-26 ENCOUNTER — Encounter: Payer: Self-pay | Admitting: Cardiology

## 2020-02-26 NOTE — Assessment & Plan Note (Signed)
He is reluctant to take a true diuretic.  Seems to doing relatively well with HCTZ stated his edema is much better than it had been.  It usually is better in the morning.  Plan: Discussed support stockings (he does not like wearing them in the summertime because of too hot) and foot elevation

## 2020-02-26 NOTE — Assessment & Plan Note (Signed)
Blood pressure looks pretty good on very low-dose HCTZ.  Probably more there because of edema and then and actually blood pressure.

## 2020-02-27 ENCOUNTER — Encounter: Payer: Self-pay | Admitting: Urology

## 2020-02-27 ENCOUNTER — Other Ambulatory Visit: Payer: Self-pay

## 2020-02-27 ENCOUNTER — Telehealth: Payer: Self-pay

## 2020-02-27 NOTE — Telephone Encounter (Signed)
Spoke with patient in regards to 1 month follow-up telephone visit with Freeman Caldron PA on 02/29/20 at 3:30pm. Patient verbalized understanding of appointment date and time. Meaningful use questions were reviewed. TM

## 2020-02-27 NOTE — Progress Notes (Signed)
Patient presents for 1 month follow-up telephone appointment with Freeman Caldron PA for [facility]. Patient states that he does not have any dizziness today. Patient states mild fatigue. Patient denies headaches. Patient states that he has had severe fatigue 3-4 days ago but is better now. Patient states that he has no changes with the ringing in his ears.

## 2020-02-29 ENCOUNTER — Ambulatory Visit
Admission: RE | Admit: 2020-02-29 | Discharge: 2020-02-29 | Disposition: A | Discharge status: Home / Self Care | Payer: Medicare Other | Source: Ambulatory Visit | Attending: Urology | Admitting: Urology

## 2020-02-29 ENCOUNTER — Other Ambulatory Visit: Payer: Self-pay

## 2020-02-29 ENCOUNTER — Other Ambulatory Visit: Payer: Self-pay | Admitting: Urology

## 2020-02-29 DIAGNOSIS — C7931 Secondary malignant neoplasm of brain: Secondary | ICD-10-CM

## 2020-02-29 MED ORDER — HYDROCOD POLST-CPM POLST ER 10-8 MG/5ML PO SUER: 5.0000 mL | Freq: Every evening | ORAL | 0 refills | Status: DC | PRN

## 2020-02-29 NOTE — Progress Notes (Signed)
.  Radiation Oncology         [phone]) [phone] ________________________________  Name: Ethan Brown MRN: [id number]  Date: 02/29/2020  DOB: 1951-04-26  Post Treatment Note  CC: Laurey Morale, MD  Curt Bears, MD  Diagnosis:   69 yo male with a new 38mm brain metastasis in the right thalamocapsular region from Stage IV (T2b, N2, M1c) adenocarcinoma of the right middle lobe of the lung.     Interval Since Last Radiation:  1 month 01/23/2020: SRS//PTV2: 54mm right thalamocapsular target was treated to a prescription dose of 20 Gy in a single fraction.    04/07/19: SRS// PTV1:  6 mm  right parietal target was treated to a prescription dose of 20 Gy in a single fraction.    Narrative:  I spoke with the patient to conduct his routine scheduled 1 month follow up visit via telephone to spare the patient unnecessary potential exposure in the healthcare setting during the current COVID-19 pandemic.  The patient was notified in advance and gave permission to proceed with this visit format.   He has recovered well from the effects of his recent radiotherapy and is without complaints.     He was initially on systemic chemotherapy with carboplatin, Alimta and Keytruda every 3 weeks under the care and direction of Dr. Earlie Server and completed 4 cycles.  His restaging imaging showed a good response to treatment with a decrease in the right pleural effusion, decreased mediastinal and right hilar lymphadenopathy, and improvement in the liver lesion and right middle lobe pulmonary lesion so he was transitioned to maintenance therapy with Alimta and Keytruda, beginning with cycle 5 of his treatment on 06/27/2019 which he continues to tolerate well.  His recent restaging imaging with CT C/A/P from 02/09/20 showed continued disease stability so he proceeded with cycle #16 of maintenance treatment on 02/13/20.               On review of systems, the patient states that he is doing very well overall.  He is currently  without complaints and specifically denies headaches, changes in auditory or visual acuity, nausea, vomiting, dizziness, imbalance, tremors or seizure activity.  He has not had recent fevers, chills or night sweats.  He is tolerating his systemic therapy quite well, aside from some edema in the upper and lower extremities which is improved with low dose HCTZ.  He is also struggling with a nagging, dry cough that has been persistent for the past 6-8 weeks and gradually improving.  The cough started out wet, with increased production of clear mucous but has since dried up.  He reports similar syptoms in the past associated with chronic bronchitis which often took 8+ weeks to resolve. He reports having an expired bottle of Tussionex cough syrup which he used last night and allowed him to sleep through the night for the first time in weeks. Otherwise, he is very pleased with his progress to date.  ALLERGIES:  has No Known Allergies.  Meds: Current Outpatient Medications  Medication Sig Dispense Refill  . cyanocobalamin (,VITAMIN B-12,) 1000 MCG/ML injection INJECT 1 ML (1,000 MCG TOTAL) INTO THE MUSCLE ONCE FOR 1 DOSE. 5 mL 1  . folic acid (FOLVITE) 1 MG tablet Take 1 tablet (1 mg total) by mouth daily. 90 tablet 1  . hydrochlorothiazide (MICROZIDE) 12.5 MG capsule Take 1 capsule (12.5 mg total) by mouth 2 (two) times daily. 180 capsule 2  . levofloxacin (LEVAQUIN) 500 MG tablet Take 1 tablet (500  mg total) by mouth daily. 7 tablet 0  . lidocaine-prilocaine (EMLA) cream Apply to the Port-A-Cath site 30-60 minutes before treatment 30 g 0  . Multiple Vitamin (MULTIVITAMIN WITH MINERALS) TABS tablet Take 1 tablet by mouth daily.    . sildenafil (REVATIO) 20 MG tablet TAKE AS DIRECTED 10 tablet 11  . tamsulosin (FLOMAX) 0.4 MG CAPS capsule Take 0.4 mg by mouth daily.    . chlorpheniramine-HYDROcodone (TUSSIONEX) 10-8 MG/5ML SUER Take 5 mLs by mouth at bedtime as needed for cough. 140 mL 0   No current  facility-administered medications for this encounter.    Physical Findings: Unable to assess due to telephone follow-up visit format.  Lab Findings: Lab Results  Component Value Date   WBC 4.8 02/13/2020   HGB 13.2 02/13/2020   HCT 38.6 (L) 02/13/2020   MCV 98.2 02/13/2020   PLT 256 02/13/2020     Radiographic Findings: CT Chest W Contrast  Result Date: 02/12/2020 CLINICAL DATA:  Non-small cell lung cancer restaging. Stage IV presenting with right middle lobe mass as well as malignant right pleural effusion with pleural based nodules along with brain metastatic disease. Ongoing systemic chemotherapy. EXAM: CT CHEST, ABDOMEN, AND PELVIS WITH CONTRAST TECHNIQUE: Multidetector CT imaging of the chest, abdomen and pelvis was performed following the standard protocol during bolus administration of intravenous contrast. CONTRAST:  145mL OMNIPAQUE IOHEXOL 300 MG/ML  SOLN COMPARISON:  Multiple exams, including 12/11/2019 FINDINGS: CT CHEST FINDINGS Cardiovascular: Right Port-A-Cath tip: SVC.  Unremarkable Mediastinum/Nodes: Subcarinal node 1.0 cm in short axis on image 35/2, previously the same. Lungs/Pleura: Small exudative right pleural effusion with enhancement along pleural margins and some mild loculation. Trace left pleural effusion. Central nodularity along the right middle lobe minor fissure measures approximately 1.9 by 1.2 cm on image 93/4, previously the same by my measurements. Stable anterior 0.6 by 0.4 cm right middle lobe nodule on image 108/4. Slightly increased confluent volume loss and opacity inferiorly in the right lower lobe. Mild nodularity along the major fissure on the right. Musculoskeletal: Lower thoracic spondylosis. CT ABDOMEN PELVIS FINDINGS Hepatobiliary: Unremarkable Pancreas: Unremarkable Spleen: Unremarkable Adrenals/Urinary Tract: The prostate gland indents the bladder base. The kidneys appear unremarkable as do the adrenal glands. Stomach/Bowel: There are few scattered  sigmoid colon diverticula. Prominent stool throughout the colon favors constipation. Vascular/Lymphatic: Unremarkable Reproductive: Prostatomegaly. Other: Stable stranding in the central mesentery, possibly from mild chronic sclerosing mesenteritis. Musculoskeletal: Enlarging sclerotic lesion in the right iliac bone on image 102/2. In this context the appearance is suspicious for a metastatic lesion. Increasing sclerosis is nonspecific but could reflect healing response to therapy. IMPRESSION: 1. Small exudative right pleural effusion with enhancement along pleural margins and some mild loculation. 2. Slightly increased confluent volume loss and opacity inferiorly in the right lower lobe, probably mostly from atelectasis, although a component of pneumonia is not excluded. 3. Stable size of the right middle lobe nodule and stable small right middle lobe nodule. 4. Enlarging sclerotic lesion in the right iliac bone, suspicious for a metastatic lesion. Increasing sclerosis is nonspecific but could reflect healing response to therapy. 5. Other imaging findings of potential clinical significance: Prostatomegaly. Prominent stool throughout the colon favors constipation. Stable stranding in the central mesentery, possibly from mild chronic sclerosing mesenteritis. Electronically Signed   By: Van Clines M.D.   On: 02/12/2020 11:36   CT Abdomen Pelvis W Contrast  Result Date: 02/12/2020 CLINICAL DATA:  Non-small cell lung cancer restaging. Stage IV presenting with right middle lobe mass as  well as malignant right pleural effusion with pleural based nodules along with brain metastatic disease. Ongoing systemic chemotherapy. EXAM: CT CHEST, ABDOMEN, AND PELVIS WITH CONTRAST TECHNIQUE: Multidetector CT imaging of the chest, abdomen and pelvis was performed following the standard protocol during bolus administration of intravenous contrast. CONTRAST:  158mL OMNIPAQUE IOHEXOL 300 MG/ML  SOLN COMPARISON:  Multiple  exams, including 12/11/2019 FINDINGS: CT CHEST FINDINGS Cardiovascular: Right Port-A-Cath tip: SVC.  Unremarkable Mediastinum/Nodes: Subcarinal node 1.0 cm in short axis on image 35/2, previously the same. Lungs/Pleura: Small exudative right pleural effusion with enhancement along pleural margins and some mild loculation. Trace left pleural effusion. Central nodularity along the right middle lobe minor fissure measures approximately 1.9 by 1.2 cm on image 93/4, previously the same by my measurements. Stable anterior 0.6 by 0.4 cm right middle lobe nodule on image 108/4. Slightly increased confluent volume loss and opacity inferiorly in the right lower lobe. Mild nodularity along the major fissure on the right. Musculoskeletal: Lower thoracic spondylosis. CT ABDOMEN PELVIS FINDINGS Hepatobiliary: Unremarkable Pancreas: Unremarkable Spleen: Unremarkable Adrenals/Urinary Tract: The prostate gland indents the bladder base. The kidneys appear unremarkable as do the adrenal glands. Stomach/Bowel: There are few scattered sigmoid colon diverticula. Prominent stool throughout the colon favors constipation. Vascular/Lymphatic: Unremarkable Reproductive: Prostatomegaly. Other: Stable stranding in the central mesentery, possibly from mild chronic sclerosing mesenteritis. Musculoskeletal: Enlarging sclerotic lesion in the right iliac bone on image 102/2. In this context the appearance is suspicious for a metastatic lesion. Increasing sclerosis is nonspecific but could reflect healing response to therapy. IMPRESSION: 1. Small exudative right pleural effusion with enhancement along pleural margins and some mild loculation. 2. Slightly increased confluent volume loss and opacity inferiorly in the right lower lobe, probably mostly from atelectasis, although a component of pneumonia is not excluded. 3. Stable size of the right middle lobe nodule and stable small right middle lobe nodule. 4. Enlarging sclerotic lesion in the right  iliac bone, suspicious for a metastatic lesion. Increasing sclerosis is nonspecific but could reflect healing response to therapy. 5. Other imaging findings of potential clinical significance: Prostatomegaly. Prominent stool throughout the colon favors constipation. Stable stranding in the central mesentery, possibly from mild chronic sclerosing mesenteritis. Electronically Signed   By: Van Clines M.D.   On: 02/12/2020 11:36    Impression/Plan: 18. 69 yo man with a new 18mm  lesion in the right thalamocapsular region from Stage IV (T2b, N2, M1c) adenocarcinoma of the right middle lobe of the lung.    He appears to have recovered well from the effects of his recent [facility] treatment and is currently without complaints aside from a nagging, dry cough which has been present for approximately 6 to 8 weeks and gradually improving.  He reports having an expired bottle of Tussionex cough syrup which he used last night and allowed him to sleep through the night for the first time in weeks.  He has requested a refill of this medication and I am happy to accommodate.  We discussed the plan for a repeat MRI brain scan in approximately 2 months to assess treatment response and I will plan to call him with those results and recommendations from brain tumor board.  Pending this scan is stable, we will plan to resume serial brain MRI scans every 3 months to monitor for any disease progression or recurrence with a telephone follow-up after each scan.  He is comfortable and in agreement with the stated plan.  He knows to call at anytime in the interim  with any questions or concerns.    Nicholos Johns, PA-C

## 2020-03-05 ENCOUNTER — Ambulatory Visit: Payer: Medicare Other | Admitting: Internal Medicine

## 2020-03-05 ENCOUNTER — Other Ambulatory Visit: Payer: Medicare Other

## 2020-03-05 ENCOUNTER — Ambulatory Visit: Payer: Medicare Other

## 2020-03-09 NOTE — Progress Notes (Signed)
[facility] OFFICE PROGRESS NOTE  Laurey Morale, MD [place] [address]  DIAGNOSIS: Stage IV (T2b, N2, M1c)presented with right middle lobe lung mass in addition to mediastinal lymphadenopathy as well as malignant right pleural effusion with pleural-based nodules and suspicious right hepatic lesionand the brain metastasisdiagnosed in July 2020.  Molecular Biomarkers: [id number] (Exon 20 insertion) 0.2% Dacomitinib,Neratinib,Osimertinib  [id number] 0.1% None  PRIOR THERAPY: 1) SRS treatment to the metastatic disease to the brain under the care of Dr. Tammi Klippel. Completed on 04/07/2019. 2) SRS to the suspicious small brain lesion in the thalamocapsular region 01/23/2020 under the care of Dr. Tammi Klippel  CURRENT THERAPY: Systemic chemotherapy with carboplatin for AUC of 5, Alimta 500 mg/M2 and Keytruda 200 mg IV every 3 weeks. First dose April 04, 2019.Status post16cycles.Starting from cycle #5 he has been onmaintenanceAlimta and Keytruda every 3 weeks.   INTERVAL HISTORY: Ethan Brown 69 y.o. male returns to the clinic for a follow up visit.  The patient has been experiencing a troublesome cough for several weeks now.  He was given a prescription for Levaquin at his last appointment without any appreciable change in his cough.  He is also been using Tussionex and Delsym.  He is unsure if these provide significant relief as he continues to cough.  His cough sometimes is productive with a lot of mucus.  He denies any associated fevers.  He had a restaging CT scan last month which demonstrated a stable small exudative right pleural effusion and a slightly increased opacity in the right lower lobe, mostly thought to reflect atelectasis although a component of pneumonia cannot be excluded.  Despite having this ongoing cough, the patient had not experienced any associated shortness of breath until recently.  The patient was previously very  active walking several miles per day.  The patient notes on ~7/9 and 7/10 that he started experiencing shortness of breath with exertion.  He notes that he would experience shortness of breath after climbing stairs as well as carrying luggage.  The patient drove 4 hours to the beach on 7/10. He states that he was able to walk on the flat beach without any significant shortness of breath. The patient denies any new or worsening lower extremity swelling but states that he often has edema in his legs at baseline but believes that it is "a little bit better" at this time.  He denies any calf pain.  The patient states that he has had right-sided chest discomfort with coughing for several months.  He denies any pain with inspiration but states that he can "feel it" with a deep breath. His face is flushed today but the patient denies any sunburns or steroid use.  Otherwise, the patient continues to tolerate treatment with maintenance Alimta and keytruda well without any adverse effects. He had SRS treatment to a new small 2 mm lesion last month and had a 1 month follow up visit with radiation oncology recently. He was prescribe tussinex at this visit but was not experiencing shortness of breath at that time. Denies any chills, night sweats, or significant weight loss. Denies any hemoptysis.  Denies any nausea, vomiting, diarrhea, or constipation but reports an uneasy feeling in his stomach following treatment. Denies any rashes. The patient is here today for evaluation prior to starting cycle # 17   MEDICAL HISTORY: Past Medical History:  Diagnosis Date  . Borderline hypertension   . Brain metastasis ([facility]) dx'd 01/2019  . Detached vitreous humor,  left   . Dyspnea   . ED (erectile dysfunction) of organic origin   . Hyperlipidemia   . Hypertension   . Lens subluxation, left   . Lung cancer ([facility]) dx'd 01/2019   Stage IV  . Migraine headache    takes PRN Imitrex  . Pseudophakia   . TGA (transient global  amnesia) 2017    ALLERGIES:  has No Known Allergies.  MEDICATIONS:  Current Outpatient Medications  Medication Sig Dispense Refill  . chlorpheniramine-HYDROcodone (TUSSIONEX) 10-8 MG/5ML SUER Take 5 mLs by mouth at bedtime as needed for cough. 140 mL 0  . cyanocobalamin (,VITAMIN B-12,) 1000 MCG/ML injection INJECT 1 ML (1,000 MCG TOTAL) INTO THE MUSCLE ONCE FOR 1 DOSE. 5 mL 1  . folic acid (FOLVITE) 1 MG tablet Take 1 tablet (1 mg total) by mouth daily. 90 tablet 1  . hydrochlorothiazide (MICROZIDE) 12.5 MG capsule Take 1 capsule (12.5 mg total) by mouth 2 (two) times daily. 180 capsule 2  . lidocaine-prilocaine (EMLA) cream Apply to the Port-A-Cath site 30-60 minutes before treatment 30 g 0  . Multiple Vitamin (MULTIVITAMIN WITH MINERALS) TABS tablet Take 1 tablet by mouth daily.    . sildenafil (REVATIO) 20 MG tablet TAKE AS DIRECTED 10 tablet 11  . tamsulosin (FLOMAX) 0.4 MG CAPS capsule Take 0.4 mg by mouth daily.    . predniSONE (DELTASONE) 10 MG tablet Please take 9 tablets (90 mg) by mouth daily for 5 days, followed by 7 tablets (70 mg) daily for 5 days, followed by 5 tablets (50 mg) daily for 5 days, followed by 3 tablets (30 mg) daily for 5 days, followed by 1 tablet (10 mg) daily for 5 days. 125 tablet 0   No current facility-administered medications for this visit.   Facility-Administered Medications Ordered in Other Visits  Medication Dose Route Frequency Provider Last Rate Last Admin  . heparin lock flush 100 UNIT/ML injection           . prochlorperazine (COMPAZINE) tablet 10 mg  10 mg Oral Once Curt Bears, MD      . sodium chloride (PF) 0.9 % injection           . sodium chloride flush (NS) 0.9 % injection 10 mL  10 mL Intracatheter PRN Curt Bears, MD   10 mL at 03/12/20 1413    SURGICAL HISTORY:  Past Surgical History:  Procedure Laterality Date  . CAROTID DOPPLERS  09/2017   Mild non-obstructive disease  . CATARACT EXTRACTION, BILATERAL    . cataract,  left  2018  . CHEST TUBE INSERTION Right 03/29/2019   Procedure: INSERTION PLEURAL DRAINAGE CATHETER;  Surgeon: [name], MD;  Location: [facility];  Service: Thoracic;  Laterality: Right;  . COLONOSCOPY  2016   clear, repeat in 10 yrs   . Eye surgeries     , Lens attachment.  Vitrectomy  . FEMORAL HERNIA REPAIR    . HERNIA REPAIR    . IR THORACENTESIS ASP PLEURAL SPACE W/IMG GUIDE  02/22/2019  . IR THORACENTESIS ASP PLEURAL SPACE W/IMG GUIDE  03/13/2019  . LUMBAR LAMINECTOMY    . PLEURAL BIOPSY Right 03/29/2019   Procedure: PLEURAL BIOPSY;  Surgeon: [name], MD;  Location: [facility];  Service: Thoracic;  Laterality: Right;  . PORTACATH PLACEMENT N/A 03/29/2019   Procedure: INSERTION PORT-A-CATH;  Surgeon: [name], MD;  Location: [facility];  Service: Thoracic;  Laterality: N/A;  . retinal attachment, right    . sclearl buckle,  right    . victrectomy,right    . VIDEO ASSISTED THORACOSCOPY Right 03/29/2019   Procedure: VIDEO ASSISTED THORACOSCOPY;  Surgeon: [name], MD;  Location: [facility] OR;  Service: Thoracic;  Laterality: Right;    REVIEW OF SYSTEMS:   Review of Systems  Constitutional: Negative for appetite change, chills, fatigue, fever and unexpected weight change.  HENT: Negative for mouth sores, nosebleeds, sore throat and trouble swallowing.   Eyes: Negative for eye problems and icterus.  Respiratory: Positive for chronic cough and shortness of breath. Negative for hemoptysis, and wheezing.   Cardiovascular: Positive for mild chest discomfort with coughing. Positive for baseline lower extremity swelling (R>L). Gastrointestinal: Negative for abdominal pain, constipation, diarrhea, nausea and vomiting.  Genitourinary: Negative for bladder incontinence, difficulty urinating, dysuria, frequency and hematuria.   Musculoskeletal: Negative for back pain, gait problem, neck pain and neck stiffness. No calf pain.  Skin: Facial flushing. Negative for itching  and rash.  Neurological: Negative for dizziness, extremity weakness, gait problem, headaches, light-headedness and seizures.  Hematological: Negative for adenopathy. Does not bruise/bleed easily.  Psychiatric/Behavioral: Negative for confusion, depression and sleep disturbance. The patient is not nervous/anxious.     PHYSICAL EXAMINATION:  Blood pressure (!) 144/89, pulse 93, temperature 98.6 F (37 C), temperature source Temporal, resp. rate (!) 26, weight 201 lb 11.2 oz (91.5 kg), SpO2 95 %.  ECOG PERFORMANCE STATUS: 1 - Symptomatic but completely ambulatory  Physical Exam  Constitutional: Oriented to person, place, and time and well-developed, well-nourished, and in no distress.   HENT:  Head: Normocephalic and atraumatic.  Mouth/Throat: Oropharynx is clear and moist. No oropharyngeal exudate.  Eyes: Conjunctivae are normal. Right eye exhibits no discharge. Left eye exhibits no discharge. No scleral icterus.  Neck: Normal range of motion. Neck supple.  Cardiovascular: Normal rate, regular rhythm, normal heart sounds and intact distal pulses.   Pulmonary/Chest: Effort normal. Decreased breath sounds in right lower lobe and left lower lobe. No wheezes. No rales. The patient was having a coughing spell during most of the encounter today.  Abdominal: Soft. Bowel sounds are normal. Exhibits no distension and no mass. There is no tenderness.  Musculoskeletal: Normal range of motion. Exhibits no edema.  Lymphadenopathy:    No cervical adenopathy.  Neurological: Alert and oriented to person, place, and time. Exhibits normal muscle tone. Gait normal. Coordination normal.  Skin: Skin is warm and dry. No rash noted. Not diaphoretic. No erythema. No pallor.  Psychiatric: Mood, memory and judgment normal.  Vitals reviewed.  LABORATORY DATA: Lab Results  Component Value Date   WBC 5.3 03/12/2020   HGB 13.8 03/12/2020   HCT 40.7 03/12/2020   MCV 96.9 03/12/2020   PLT 255 03/12/2020       Chemistry      Component Value Date/Time   NA 139 03/12/2020 1054   K 3.9 03/12/2020 1054   CL 103 03/12/2020 1054   CO2 25 03/12/2020 1054   BUN 14 03/12/2020 1054   CREATININE 1.08 03/12/2020 1054      Component Value Date/Time   CALCIUM 9.7 03/12/2020 1054   ALKPHOS 83 03/12/2020 1054   AST 38 03/12/2020 1054   ALT 28 03/12/2020 1054   BILITOT 0.4 03/12/2020 1054       RADIOGRAPHIC STUDIES:  CT ANGIO CHEST PE W OR WO CONTRAST  Result Date: 03/12/2020 CLINICAL DATA:  History of non-small cell lung cancer with shortness of breath EXAM: CT ANGIOGRAPHY CHEST WITH CONTRAST TECHNIQUE: Multidetector CT imaging of the chest  was performed using the standard protocol during bolus administration of intravenous contrast. Multiplanar CT image reconstructions and MIPs were obtained to evaluate the vascular anatomy. CONTRAST:  138m OMNIPAQUE IOHEXOL 350 MG/ML SOLN COMPARISON:  02/09/2020 FINDINGS: Cardiovascular: Thoracic aorta demonstrates mild atherosclerotic calcifications. No aneurysmal dilatation or dissection is identified. No cardiac enlargement is seen. No coronary calcifications are noted. The pulmonary artery shows a normal branching pattern no definitive filling defects to suggest pulmonary emboli are seen at this time. Mild narrowing of the central aspect of the right subclavian vein is seen which may be in part due to the known right chest wall port. Mild chest wall collaterals are noted. Mediastinum/Nodes: Thoracic inlet is within normal limits. Esophagus as visualized is within normal limits. Some soft tissue density is noted adjacent to the distal aspect of the esophagus representing a combination of pleural fluid and subcarinal lymph node. The overall appearance is similar to that seen on the prior CT examination. No significant mediastinal lymph nodes are seen. Lungs/Pleura: Right-sided pleural effusion is stable in appearance from the prior exam. Associated lower lobe consolidation is  seen. The pleural margins on the right demonstrates some mild enhancement similar to that noted on the prior exam. Stable nodule in the right middle lobe is again seen along the cardiac border similar to that noted on the prior exam. Increased interstitial edema is noted and there are multifocal somewhat ground-glass nodules identified throughout right lung. Given their abrupt occurrences are felt to be post inflammatory in nature. The possibility of very aggressive carcinoma could not be totally excluded however. Similar changes are noted in the left lung. A large left-sided pleural effusion is noted new from the prior exam. This appears relatively simple in nature. No sizable nodules are noted on the left. Upper Abdomen: Visualized upper abdomen is within normal limits. Musculoskeletal: No acute rib abnormality is noted. Degenerative changes of the thoracic spine are seen. Review of the MIP images confirms the above findings. IMPRESSION: No definitive pulmonary embolism is identified. There are multiple nodular densities identified within both lungs new from the prior exam of 1 month previous. These likely represent pneumonic infiltrate given their abrupt in nature although the possibility of aggressive neoplastic involvement could not be totally excluded. Stable right-sided pleural effusion is noted with peripheral enhancement New large left-sided pleural effusion. Stable right middle lobe nodule consistent with the known history. Electronically Signed   By: MInez CatalinaM.D.   On: 03/12/2020 16:16     ASSESSMENT/PLAN:  This isa very pleasant89year old Caucasian male who was diagnosed with stage IV non-small cell lung cancer, adenocarcinoma with a positive EGFR resistant mutation in exon 20. He may be eligible for targeted therapy in the future if he progresses on his current treatment. He presented with extensive disease involving the right upper lobe, right middle lobe, and right lower lobe with  extensive pleural-based metastases. He also has mediastinal and hilar adenopathy and a rightmalignant pleural effusion. He also has metastatic disease to the brain and a suspicious liver lesion.  He completed SRS treatment to the metastatic disease to the brain under care of Dr. MTammi Klippel  He had a pleurex catheter placed under the care of Dr. HRoxan Hockeyfor his malignant pleural effusion.This was removed 07/11/2019  He is currently undergoing systemic chemotherapy with carboplatin AUC of 5, Alimta 500 mg/m, and Keytruda 200 mg IV every 3 weeks. He is status post16 cycles. Starting from cycle #5, he has been on maintenance Alimta 500 mg/m2 and Keytruda 200  mg IV every 3 weeks.   He recently had SRS treatment to the another suspicious brain lesion on 01/23/20 under the care of Dr. Tammi Klippel.  The patient was seen with Dr. Julien Nordmann today. Discussed the patient's symptoms with Dr. Julien Nordmann. Dr. Julien Nordmann recommended placing the patient in a high dose prednisone taper with 90 mg daily for 5 days, 70 mg daily for 5 days, 50 mg daily for 5 days, 30 mg daily for 5 days, followed by 10 mg daily for 5  Recommend that the patient proceed with cycle #17 today as scheduled with single agent Alimta.   I will arrange for the patient to have a stat CT angio of the chest today to rule out PE and to assess for other etiology of his shortness of breath. The scan showed multiple nodular densities identified within both lungs new from the prior exam of 1 month previous. This may represent pneumonitis. Dr. Julien Nordmann and I personally and independently reviewed the images. I called the patient with the results and instructed him to start the prednisone taper. If he does not have improvement in his symptoms by the end of the week, he was instructed to call back and we will send in a prescription for doxycycline. He recently completed a prescription of levaquin.   I will also arrange for a thoracentesis. The scan noted a  new large left sided pleural effusion. I will arrange for this to be done with cytology.   We will see him back for a follow up visit 3 weeks for evaluation before starting cycle #18.   The patient was advised to call immediately if he has any concerning symptoms in the interval. The patient voices understanding of current disease status and treatment options and is in agreement with the current care plan. All questions were answered. The patient knows to call the clinic with any problems, questions or concerns. We can certainly see the patient much sooner if necessary .  Orders Placed This Encounter  Procedures  . CT ANGIO CHEST PE W OR WO CONTRAST    Standing Status:   Future    Number of Occurrences:   1    Standing Expiration Date:   03/12/2021    Order Specific Question:   ** REASON FOR EXAM (FREE TEXT)    Answer:   Lung Cancer, new onset shortness of breath. Rule out PE vs pneumonitis. Patient also endorsing several weeks of significant cough.    Order Specific Question:   If indicated for the ordered procedure, I authorize the administration of contrast media per Radiology protocol    Answer:   Yes    Order Specific Question:   Preferred imaging location?    Answer:   [facility]    Order Specific Question:   Call Results- Best Contact Number?    Answer:   [phone]- PT can leave after exam is complete     Order Specific Question:   Radiology Contrast Protocol - do NOT remove file path    Answer:   [email or web address]     Dama Hedgepeth L Rodriquez Thorner, PA-C 03/12/20  ADDENDUM: Hematology/Oncology Attending: I had a face-to-face encounter with the patient today.  I recommended his care plan.  This is a very pleasant 69 years old white male with a stage IV non-small cell lung cancer, adenocarcinoma with positive EGFR mutation but with insertion in exon 20.  The patient status post induction systemic chemotherapy with carboplatin, Alimta and  Keytruda status post 4 cycles.  He  is currently on maintenance treatment with Alimta and Keytruda every 3 weeks status post 12 more cycles.  The patient has been tolerating this treatment fairly well with no concerning adverse effect but over the last week he was at the beach and he start feeling more short of breath and persistent cough.  He was treated with a course of Levaquin with no improvement in his symptoms. I recommended for the patient to hold his treatment with [facility] for this cycle because of concern about immunotherapy mediated pneumonitis but he will continue his treatment with Alimta as planned.  I will start the patient on a tapered dose of prednisone over the next 3 weeks. In the meantime, we will order CT angiogram of the chest to be done as soon as possible to rule out pulmonary embolism and also to identify any other etiology for his dyspnea. Later today the result of the CT angiogram of the chest became available and the patient has enlarging left pleural effusion as well as interstitial lung disease in the left upper lobe. We will arrange for the patient to have ultrasound-guided thoracentesis of the left pleural effusion and we will send it for cytologic evaluation. He will continue his current treatment with the high-dose prednisone to be tapered over the next few weeks. The patient was advised to call immediately if he has no improvement of his symptoms in the next 5-7 days.  He was also advised to go immediately to the emergency department if he has any worsening of his symptoms. The patient was advised to call immediately if he has any other concerning symptoms in the interval.  Disclaimer: This note was dictated with voice recognition software. Similar sounding words can inadvertently be transcribed and may be missed upon review. Eilleen Kempf, MD 03/12/20

## 2020-03-12 ENCOUNTER — Inpatient Hospital Stay: Payer: Medicare Other | Attending: Internal Medicine

## 2020-03-12 ENCOUNTER — Encounter: Payer: Self-pay | Admitting: Physician Assistant

## 2020-03-12 ENCOUNTER — Other Ambulatory Visit: Payer: Self-pay

## 2020-03-12 ENCOUNTER — Ambulatory Visit (HOSPITAL_COMMUNITY)
Admission: RE | Admit: 2020-03-12 | Discharge: 2020-03-12 | Disposition: A | Discharge status: Home / Self Care | Payer: Medicare Other | Source: Ambulatory Visit | Attending: Physician Assistant | Admitting: Physician Assistant

## 2020-03-12 ENCOUNTER — Other Ambulatory Visit: Payer: Self-pay | Admitting: Physician Assistant

## 2020-03-12 ENCOUNTER — Inpatient Hospital Stay: Payer: Medicare Other

## 2020-03-12 ENCOUNTER — Inpatient Hospital Stay (HOSPITAL_BASED_OUTPATIENT_CLINIC_OR_DEPARTMENT_OTHER): Payer: Medicare Other | Admitting: Physician Assistant

## 2020-03-12 VITALS — BP 144/89 | HR 93 | Temp 98.6°F | Resp 26 | Wt 201.7 lb

## 2020-03-12 DIAGNOSIS — C782 Secondary malignant neoplasm of pleura: Secondary | ICD-10-CM | POA: Insufficient documentation

## 2020-03-12 DIAGNOSIS — C3491 Malignant neoplasm of unspecified part of right bronchus or lung: Secondary | ICD-10-CM

## 2020-03-12 DIAGNOSIS — J811 Chronic pulmonary edema: Secondary | ICD-10-CM | POA: Diagnosis not present

## 2020-03-12 DIAGNOSIS — C7931 Secondary malignant neoplasm of brain: Secondary | ICD-10-CM | POA: Diagnosis not present

## 2020-03-12 DIAGNOSIS — R0602 Shortness of breath: Secondary | ICD-10-CM | POA: Insufficient documentation

## 2020-03-12 DIAGNOSIS — R053 Chronic cough: Secondary | ICD-10-CM

## 2020-03-12 DIAGNOSIS — J8489 Other specified interstitial pulmonary diseases: Secondary | ICD-10-CM | POA: Diagnosis not present

## 2020-03-12 DIAGNOSIS — C3411 Malignant neoplasm of upper lobe, right bronchus or lung: Secondary | ICD-10-CM | POA: Diagnosis not present

## 2020-03-12 DIAGNOSIS — Z5112 Encounter for antineoplastic immunotherapy: Secondary | ICD-10-CM | POA: Insufficient documentation

## 2020-03-12 DIAGNOSIS — Z5111 Encounter for antineoplastic chemotherapy: Secondary | ICD-10-CM | POA: Diagnosis not present

## 2020-03-12 DIAGNOSIS — J90 Pleural effusion, not elsewhere classified: Secondary | ICD-10-CM | POA: Diagnosis not present

## 2020-03-12 DIAGNOSIS — C787 Secondary malignant neoplasm of liver and intrahepatic bile duct: Secondary | ICD-10-CM | POA: Diagnosis not present

## 2020-03-12 DIAGNOSIS — R05 Cough: Secondary | ICD-10-CM | POA: Diagnosis not present

## 2020-03-12 DIAGNOSIS — Z923 Personal history of irradiation: Secondary | ICD-10-CM | POA: Insufficient documentation

## 2020-03-12 DIAGNOSIS — J910 Malignant pleural effusion: Secondary | ICD-10-CM | POA: Diagnosis not present

## 2020-03-12 DIAGNOSIS — I700 Atherosclerosis of aorta: Secondary | ICD-10-CM | POA: Diagnosis not present

## 2020-03-12 DIAGNOSIS — R059 Cough, unspecified: Secondary | ICD-10-CM

## 2020-03-12 DIAGNOSIS — Z95828 Presence of other vascular implants and grafts: Secondary | ICD-10-CM

## 2020-03-12 DIAGNOSIS — Z79899 Other long term (current) drug therapy: Secondary | ICD-10-CM | POA: Insufficient documentation

## 2020-03-12 DIAGNOSIS — E079 Disorder of thyroid, unspecified: Secondary | ICD-10-CM

## 2020-03-12 DIAGNOSIS — M7989 Other specified soft tissue disorders: Secondary | ICD-10-CM | POA: Insufficient documentation

## 2020-03-12 LAB — CBC WITH DIFFERENTIAL (CANCER CENTER ONLY)
Abs Immature Granulocytes: 0.01 10*3/uL (ref 0.00–0.07)
Basophils Absolute: 0.1 10*3/uL (ref 0.0–0.1)
Basophils Relative: 1 %
Eosinophils Absolute: 0.1 10*3/uL (ref 0.0–0.5)
Eosinophils Relative: 2 %
HCT: 40.7 % (ref 39.0–52.0)
Hemoglobin: 13.8 g/dL (ref 13.0–17.0)
Immature Granulocytes: 0 %
Lymphocytes Relative: 16 %
Lymphs Abs: 0.8 10*3/uL (ref 0.7–4.0)
MCH: 32.9 pg (ref 26.0–34.0)
MCHC: 33.9 g/dL (ref 30.0–36.0)
MCV: 96.9 fL (ref 80.0–100.0)
Monocytes Absolute: 0.6 10*3/uL (ref 0.1–1.0)
Monocytes Relative: 11 %
Neutro Abs: 3.8 10*3/uL (ref 1.7–7.7)
Neutrophils Relative %: 70 %
Platelet Count: 255 10*3/uL (ref 150–400)
RBC: 4.2 MIL/uL — ABNORMAL LOW (ref 4.22–5.81)
RDW: 12.6 % (ref 11.5–15.5)
WBC Count: 5.3 10*3/uL (ref 4.0–10.5)
nRBC: 0 % (ref 0.0–0.2)

## 2020-03-12 LAB — CMP (CANCER CENTER ONLY)
ALT: 28 U/L (ref 0–44)
AST: 38 U/L (ref 15–41)
Albumin: 2.6 g/dL — ABNORMAL LOW (ref 3.5–5.0)
Alkaline Phosphatase: 83 U/L (ref 38–126)
Anion gap: 11 (ref 5–15)
BUN: 14 mg/dL (ref 8–23)
CO2: 25 mmol/L (ref 22–32)
Calcium: 9.7 mg/dL (ref 8.9–10.3)
Chloride: 103 mmol/L (ref 98–111)
Creatinine: 1.08 mg/dL (ref 0.61–1.24)
GFR, Est AFR Am: >60 mL/min (ref >60)
GFR, Estimated: >60 mL/min (ref >60)
Glucose, Bld: 106 mg/dL — ABNORMAL HIGH (ref 70–99)
Potassium: 3.9 mmol/L (ref 3.5–5.1)
Sodium: 139 mmol/L (ref 135–145)
Total Bilirubin: 0.4 mg/dL (ref 0.3–1.2)
Total Protein: 6.7 g/dL (ref 6.5–8.1)

## 2020-03-12 LAB — TSH: TSH: 1.137 u[IU]/mL (ref 0.320–4.118)

## 2020-03-12 MED ORDER — IOHEXOL 350 MG/ML SOLN
100.0000 mL | Freq: Once | INTRAVENOUS | Status: AC | PRN
  Administered 2020-03-12: 100 mL via INTRAVENOUS

## 2020-03-12 MED ORDER — PREDNISONE 10 MG PO TABS: ORAL_TABLET | ORAL | 0 refills | Status: DC

## 2020-03-12 MED ORDER — PROCHLORPERAZINE MALEATE 10 MG PO TABS: 10.0000 mg | ORAL_TABLET | Freq: Once | ORAL | Status: DC

## 2020-03-12 MED ORDER — SODIUM CHLORIDE 0.9 % IV SOLN
475.0000 mg/m2 | Freq: Once | INTRAVENOUS | Status: AC
  Administered 2020-03-12: 1000 mg via INTRAVENOUS
  Filled 2020-03-12: qty 40

## 2020-03-12 MED ORDER — SODIUM CHLORIDE 0.9% FLUSH
10.0000 mL | INTRAVENOUS | Status: DC | PRN
  Administered 2020-03-12: 10 mL
  Filled 2020-03-12: qty 10

## 2020-03-12 MED ORDER — HEPARIN SOD (PORK) LOCK FLUSH 100 UNIT/ML IV SOLN
500.0000 [IU] | Freq: Once | INTRAVENOUS | Status: AC | PRN
  Administered 2020-03-12: 500 [IU]
  Filled 2020-03-12: qty 5

## 2020-03-12 MED ORDER — SODIUM CHLORIDE (PF) 0.9 % IJ SOLN
INTRAMUSCULAR | Status: AC
  Filled 2020-03-12: qty 50

## 2020-03-12 MED ORDER — PROCHLORPERAZINE MALEATE 10 MG PO TABS
ORAL_TABLET | ORAL | Status: AC
  Filled 2020-03-12: qty 1

## 2020-03-12 MED ORDER — SODIUM CHLORIDE 0.9 % IV SOLN
Freq: Once | INTRAVENOUS | Status: AC
  Filled 2020-03-12: qty 250

## 2020-03-12 MED ORDER — HEPARIN SOD (PORK) LOCK FLUSH 100 UNIT/ML IV SOLN
INTRAVENOUS | Status: AC
  Filled 2020-03-12: qty 5

## 2020-03-12 NOTE — Patient Instructions (Signed)
[facility] Discharge Instructions for Patients Receiving Chemotherapy  Today you received the following chemotherapy agents Alimta.  To help prevent nausea and vomiting after your treatment, we encourage you to take your nausea medication.   If you develop nausea and vomiting that is not controlled by your nausea medication, call the clinic.   BELOW ARE SYMPTOMS THAT SHOULD BE REPORTED IMMEDIATELY:  *FEVER GREATER THAN 100.5 F  *CHILLS WITH OR WITHOUT FEVER  NAUSEA AND VOMITING THAT IS NOT CONTROLLED WITH YOUR NAUSEA MEDICATION  *UNUSUAL SHORTNESS OF BREATH  *UNUSUAL BRUISING OR BLEEDING  TENDERNESS IN MOUTH AND THROAT WITH OR WITHOUT PRESENCE OF ULCERS  *URINARY PROBLEMS  *BOWEL PROBLEMS  UNUSUAL RASH Items with * indicate a potential emergency and should be followed up as soon as possible.  Feel free to call the clinic should you have any questions or concerns. The clinic phone number is [phone].  Please show the [facility] at check-in to the Emergency Department and triage nurse.

## 2020-03-12 NOTE — Progress Notes (Signed)
Per Cassie, no Keytruda today.  Only Alimta

## 2020-03-13 ENCOUNTER — Other Ambulatory Visit (HOSPITAL_COMMUNITY): Payer: Medicare Other

## 2020-03-13 ENCOUNTER — Other Ambulatory Visit: Payer: Self-pay | Admitting: *Deleted

## 2020-03-13 ENCOUNTER — Telehealth: Payer: Self-pay | Admitting: Physician Assistant

## 2020-03-13 NOTE — Telephone Encounter (Signed)
Scheduled per los. Called and spoke with patient. Confirmed appt 

## 2020-03-15 ENCOUNTER — Other Ambulatory Visit (HOSPITAL_COMMUNITY)
Admission: RE | Admit: 2020-03-15 | Discharge: 2020-03-15 | Disposition: A | Discharge status: Home / Self Care | Payer: Medicare Other | Source: Ambulatory Visit | Attending: Physician Assistant | Admitting: Physician Assistant

## 2020-03-15 DIAGNOSIS — Z20822 Contact with and (suspected) exposure to covid-19: Secondary | ICD-10-CM | POA: Diagnosis not present

## 2020-03-15 DIAGNOSIS — Z01812 Encounter for preprocedural laboratory examination: Secondary | ICD-10-CM | POA: Diagnosis not present

## 2020-03-15 LAB — SARS CORONAVIRUS 2 (TAT 6-24 HRS): SARS Coronavirus 2: NEGATIVE

## 2020-03-17 ENCOUNTER — Other Ambulatory Visit: Payer: Self-pay | Admitting: Internal Medicine

## 2020-03-17 DIAGNOSIS — Z5111 Encounter for antineoplastic chemotherapy: Secondary | ICD-10-CM

## 2020-03-18 ENCOUNTER — Ambulatory Visit (HOSPITAL_COMMUNITY)
Admission: RE | Admit: 2020-03-18 | Discharge: 2020-03-18 | Disposition: A | Discharge status: Home / Self Care | Payer: Medicare Other | Source: Ambulatory Visit | Attending: Physician Assistant | Admitting: Physician Assistant

## 2020-03-18 ENCOUNTER — Other Ambulatory Visit: Payer: Self-pay

## 2020-03-18 ENCOUNTER — Other Ambulatory Visit (HOSPITAL_COMMUNITY): Payer: Self-pay | Admitting: Radiology

## 2020-03-18 ENCOUNTER — Ambulatory Visit (HOSPITAL_COMMUNITY)
Admission: RE | Admit: 2020-03-18 | Discharge: 2020-03-18 | Disposition: A | Discharge status: Home / Self Care | Payer: Medicare Other | Source: Ambulatory Visit | Attending: Radiology | Admitting: Radiology

## 2020-03-18 DIAGNOSIS — Z9889 Other specified postprocedural states: Secondary | ICD-10-CM

## 2020-03-18 DIAGNOSIS — J90 Pleural effusion, not elsewhere classified: Secondary | ICD-10-CM

## 2020-03-18 DIAGNOSIS — J948 Other specified pleural conditions: Secondary | ICD-10-CM | POA: Diagnosis not present

## 2020-03-18 HISTORY — PX: IR THORACENTESIS ASP PLEURAL SPACE W/IMG GUIDE: IMG5380

## 2020-03-18 MED ORDER — LIDOCAINE HCL 1 % IJ SOLN
INTRAMUSCULAR | Status: DC | PRN
  Administered 2020-03-18: 5 mL

## 2020-03-18 MED ORDER — LIDOCAINE HCL 1 % IJ SOLN
INTRAMUSCULAR | Status: AC
  Filled 2020-03-18: qty 20

## 2020-03-18 NOTE — Procedures (Signed)
PROCEDURE SUMMARY:  Successful US guided left thoracentesis. Yielded 250 mL of hazy dark yellow fluid. Pt tolerated procedure well. No immediate complications.  Specimen was sent for labs. CXR ordered.  EBL < 5 mL  Ascencion Dike PA-C 03/18/2020 1:42 PM

## 2020-03-19 ENCOUNTER — Encounter: Payer: Self-pay | Admitting: *Deleted

## 2020-03-19 LAB — CYTOLOGY - NON PAP

## 2020-04-01 NOTE — Progress Notes (Signed)
[facility] OFFICE PROGRESS NOTE  Laurey Morale, MD [place] [address]  DIAGNOSIS: Stage IV (T2b, N2, M1c)presented with right middle lobe lung mass in addition to mediastinal lymphadenopathy as well as malignant right pleural effusion with pleural-based nodules and suspicious right hepatic lesionand the brain metastasisdiagnosed in July 2020.  Molecular Biomarkers: [id number] (Exon 20 insertion) 0.2% Dacomitinib,Neratinib,Osimertinib  [id number] 0.1% None  PRIOR THERAPY: 1)SRS treatment to the metastatic disease to the brain under the care of Dr. Tammi Klippel. Completed on 04/07/2019. 2) SRS to the suspicious small brain lesion in the thalamocapsular region 01/23/2020 under the care of Dr. Tammi Klippel  CURRENT THERAPY: Systemic chemotherapy with carboplatin for AUC of 5, Alimta 500 mg/M2 and Keytruda 200 mg IV every 3 weeks. First dose April 04, 2019.Status post17cycles.Starting from cycle #5 he has been onmaintenanceAlimta and Keytruda every 3 weeks.  INTERVAL HISTORY: Ethan Brown 69 y.o. male returns to the clinic today for a follow-up visit.  At the patient's last appointment, he was experiencing increased shortness of breath and cough which is unusual for him. He is normally very active.  The patient had a CTA which demonstrated possible nodular densities identified within both lungs which were new from 1 month prior. The patient was started on a prednisone taper.  He was also given a prescription of Levaquin at the prior appointment.  He also had a new large left-sided pleural effusion.  Cytology on the left pleural effusion was negative for malignant cells although the patient has a history of a right-sided malignant pleural effusion.  He had a thoracentesis which yielded 250 milliliters of fluid.   He is currently on 10 mg of his prednisone taper. He noted some associated insomnia from the steroids. The patient's cough and  shortness of breath have completely resolved and he noted that he is 100% better. Today, he denies any fever, chills, or night sweats. He notes that he has lost several pounds since his last appointment and that his swelling is better as well. He has a strong appetite.  He denies any chest pain or hemoptysis.  Denies any nausea, vomiting, diarrhea, or constipation.  Denies any headache or visual changes.  He is here today for evaluation before starting cycle #17.  MEDICAL HISTORY: Past Medical History:  Diagnosis Date   Borderline hypertension    Brain metastasis ([facility]) dx'd 01/2019   Detached vitreous humor, left    Dyspnea    ED (erectile dysfunction) of organic origin    Hyperlipidemia    Hypertension    Lens subluxation, left    Lung cancer ([facility]) dx'd 01/2019   Stage IV   Migraine headache    takes PRN Imitrex   Pseudophakia    TGA (transient global amnesia) 2017    ALLERGIES:  has No Known Allergies.  MEDICATIONS:  Current Outpatient Medications  Medication Sig Dispense Refill   chlorpheniramine-HYDROcodone (TUSSIONEX) 10-8 MG/5ML SUER Take 5 mLs by mouth at bedtime as needed for cough. 140 mL 0   cyanocobalamin (,VITAMIN B-12,) 1000 MCG/ML injection INJECT 1 ML (1,000 MCG TOTAL) INTO THE MUSCLE ONCE FOR 1 DOSE. 5 mL 1   folic acid (FOLVITE) 1 MG tablet TAKE 1 TABLET BY MOUTH EVERY DAY 90 tablet 1   hydrochlorothiazide (MICROZIDE) 12.5 MG capsule Take 1 capsule (12.5 mg total) by mouth 2 (two) times daily. 180 capsule 2   lidocaine-prilocaine (EMLA) cream Apply to the Port-A-Cath site 30-60 minutes before treatment 30 g 0  Multiple Vitamin (MULTIVITAMIN WITH MINERALS) TABS tablet Take 1 tablet by mouth daily.     predniSONE (DELTASONE) 10 MG tablet Please take 9 tablets (90 mg) by mouth daily for 5 days, followed by 7 tablets (70 mg) daily for 5 days, followed by 5 tablets (50 mg) daily for 5 days, followed by 3 tablets (30 mg) daily for 5 days, followed by 1  tablet (10 mg) daily for 5 days. 125 tablet 0   sildenafil (REVATIO) 20 MG tablet TAKE AS DIRECTED 10 tablet 11   tamsulosin (FLOMAX) 0.4 MG CAPS capsule Take 0.4 mg by mouth daily.     No current facility-administered medications for this visit.    SURGICAL HISTORY:  Past Surgical History:  Procedure Laterality Date   CAROTID DOPPLERS  09/2017   Mild non-obstructive disease   CATARACT EXTRACTION, BILATERAL     cataract, left  2018   CHEST TUBE INSERTION Right 03/29/2019   Procedure: INSERTION PLEURAL DRAINAGE CATHETER;  Surgeon: [name], MD;  Location: [facility] OR;  Service: Thoracic;  Laterality: Right;   COLONOSCOPY  2016   clear, repeat in 10 yrs    Eye surgeries     , Lens attachment.  Vitrectomy   FEMORAL HERNIA REPAIR     HERNIA REPAIR     IR THORACENTESIS ASP PLEURAL SPACE W/IMG GUIDE  02/22/2019   IR THORACENTESIS ASP PLEURAL SPACE W/IMG GUIDE  03/13/2019   IR THORACENTESIS ASP PLEURAL SPACE W/IMG GUIDE  03/18/2020   LUMBAR LAMINECTOMY     PLEURAL BIOPSY Right 03/29/2019   Procedure: PLEURAL BIOPSY;  Surgeon: [name], MD;  Location: [facility];  Service: Thoracic;  Laterality: Right;   PORTACATH PLACEMENT N/A 03/29/2019   Procedure: INSERTION PORT-A-CATH;  Surgeon: [name], MD;  Location: [facility];  Service: Thoracic;  Laterality: N/A;   retinal attachment, right     sclearl buckle, right     victrectomy,right     VIDEO ASSISTED THORACOSCOPY Right 03/29/2019   Procedure: VIDEO ASSISTED THORACOSCOPY;  Surgeon: [name], MD;  Location: [facility] OR;  Service: Thoracic;  Laterality: Right;    REVIEW OF SYSTEMS:   Review of Systems  Constitutional: Negative for appetite change, chills, fatigue, fever and unexpected weight change.  HENT:   Negative for mouth sores, nosebleeds, sore throat and trouble swallowing.   Eyes: Negative for eye problems and icterus.  Respiratory: Negative for cough, hemoptysis, shortness of breath and  wheezing.   Cardiovascular: Negative for chest pain and leg swelling.  Gastrointestinal: Negative for abdominal pain, constipation, diarrhea, nausea and vomiting.  Genitourinary: Negative for bladder incontinence, difficulty urinating, dysuria, frequency and hematuria.   Musculoskeletal: Negative for back pain, gait problem, neck pain and neck stiffness.  Skin: Negative for itching and rash.  Neurological: Negative for dizziness, extremity weakness, gait problem, headaches, light-headedness and seizures.  Hematological: Negative for adenopathy. Does not bruise/bleed easily.  Psychiatric/Behavioral: Negative for confusion, depression and sleep disturbance. The patient is not nervous/anxious.     PHYSICAL EXAMINATION:  Blood pressure 137/79, temperature 98.4 F (36.9 C), temperature source Tympanic, resp. rate 20, height '5\' 11"'  (1.803 m), weight 194 lb 6.4 oz (88.2 kg), SpO2 99 %.  ECOG PERFORMANCE STATUS: 1 - Symptomatic but completely ambulatory  Physical Exam  Constitutional: Oriented to person, place, and time and well-developed, well-nourished, and in no distress. Marland Kitchen  HENT:  Head: Normocephalic and atraumatic.  Mouth/Throat: Oropharynx is clear and moist. No oropharyngeal exudate.  Eyes: Conjunctivae are normal. Right  eye exhibits no discharge. Left eye exhibits no discharge. No scleral icterus.  Neck: Normal range of motion. Neck supple.  Cardiovascular: Normal rate, regular rhythm, normal heart sounds and intact distal pulses.   Pulmonary/Chest: Effort normal. Slightly decreased breath sounds at the base of the right lung. No respiratory distress. No wheezes. No rales.  Abdominal: Soft. Bowel sounds are normal. Exhibits no distension and no mass. There is no tenderness.  Musculoskeletal: Normal range of motion. Exhibits no edema.  Lymphadenopathy:    No cervical adenopathy.  Neurological: Alert and oriented to person, place, and time. Exhibits normal muscle tone. Gait normal.  Coordination normal.  Skin: Skin is warm and dry. No rash noted. Not diaphoretic. No erythema. No pallor.  Psychiatric: Mood, memory and judgment normal.  Vitals reviewed.  LABORATORY DATA: Lab Results  Component Value Date   WBC 7.3 04/02/2020   HGB 12.5 (L) 04/02/2020   HCT 37.7 (L) 04/02/2020   MCV 98.2 04/02/2020   PLT 210 04/02/2020      Chemistry      Component Value Date/Time   NA 137 04/02/2020 1002   K 4.0 04/02/2020 1002   CL 102 04/02/2020 1002   CO2 27 04/02/2020 1002   BUN 20 04/02/2020 1002   CREATININE 0.90 04/02/2020 1002      Component Value Date/Time   CALCIUM 8.8 (L) 04/02/2020 1002   ALKPHOS 61 04/02/2020 1002   AST 19 04/02/2020 1002   ALT 27 04/02/2020 1002   BILITOT 0.4 04/02/2020 1002       RADIOGRAPHIC STUDIES:  DG Chest 1 View  Result Date: 03/18/2020 CLINICAL DATA:  Status post right thoracentesis. EXAM: CHEST  1 VIEW COMPARISON:  PA and lateral chest 08/16/2019.  CT chest 03/12/2020. FINDINGS: The patient has a trace left pleural effusion. Small right pleural effusion has decreased since the most recent comparison plain films. No pneumothorax. Streaky opacities in the right lung base are not notably changed. The left lung appears clear. Heart size is normal. Port-A-Cath in place. IMPRESSION: Negative for pneumothorax after left thoracentesis. Trace left pleural effusion noted. Very small right pleural effusion has decreased since the prior plain films. Streaky opacities in the right lung base are unchanged since the prior plain film. Electronically Signed   By: Inge Rise M.D.   On: 03/18/2020 13:52   CT ANGIO CHEST PE W OR WO CONTRAST  Result Date: 03/12/2020 CLINICAL DATA:  History of non-small cell lung cancer with shortness of breath EXAM: CT ANGIOGRAPHY CHEST WITH CONTRAST TECHNIQUE: Multidetector CT imaging of the chest was performed using the standard protocol during bolus administration of intravenous contrast. Multiplanar CT image  reconstructions and MIPs were obtained to evaluate the vascular anatomy. CONTRAST:  123m OMNIPAQUE IOHEXOL 350 MG/ML SOLN COMPARISON:  02/09/2020 FINDINGS: Cardiovascular: Thoracic aorta demonstrates mild atherosclerotic calcifications. No aneurysmal dilatation or dissection is identified. No cardiac enlargement is seen. No coronary calcifications are noted. The pulmonary artery shows a normal branching pattern no definitive filling defects to suggest pulmonary emboli are seen at this time. Mild narrowing of the central aspect of the right subclavian vein is seen which may be in part due to the known right chest wall port. Mild chest wall collaterals are noted. Mediastinum/Nodes: Thoracic inlet is within normal limits. Esophagus as visualized is within normal limits. Some soft tissue density is noted adjacent to the distal aspect of the esophagus representing a combination of pleural fluid and subcarinal lymph node. The overall appearance is similar to that seen  on the prior CT examination. No significant mediastinal lymph nodes are seen. Lungs/Pleura: Right-sided pleural effusion is stable in appearance from the prior exam. Associated lower lobe consolidation is seen. The pleural margins on the right demonstrates some mild enhancement similar to that noted on the prior exam. Stable nodule in the right middle lobe is again seen along the cardiac border similar to that noted on the prior exam. Increased interstitial edema is noted and there are multifocal somewhat ground-glass nodules identified throughout right lung. Given their abrupt occurrences are felt to be post inflammatory in nature. The possibility of very aggressive carcinoma could not be totally excluded however. Similar changes are noted in the left lung. A large left-sided pleural effusion is noted new from the prior exam. This appears relatively simple in nature. No sizable nodules are noted on the left. Upper Abdomen: Visualized upper abdomen is  within normal limits. Musculoskeletal: No acute rib abnormality is noted. Degenerative changes of the thoracic spine are seen. Review of the MIP images confirms the above findings. IMPRESSION: No definitive pulmonary embolism is identified. There are multiple nodular densities identified within both lungs new from the prior exam of 1 month previous. These likely represent pneumonic infiltrate given their abrupt in nature although the possibility of aggressive neoplastic involvement could not be totally excluded. Stable right-sided pleural effusion is noted with peripheral enhancement New large left-sided pleural effusion. Stable right middle lobe nodule consistent with the known history. Electronically Signed   By: Inez Catalina M.D.   On: 03/12/2020 16:16   IR THORACENTESIS ASP PLEURAL SPACE W/IMG GUIDE  Result Date: 03/18/2020 INDICATION: History of lung cancer. New left-sided pleural effusion. Request for therapeutic and diagnostic left thoracentesis. EXAM: ULTRASOUND GUIDED LEFT THORACENTESIS MEDICATIONS: None. COMPLICATIONS: None immediate. PROCEDURE: An ultrasound guided thoracentesis was thoroughly discussed with the patient and questions answered. The benefits, risks, alternatives and complications were also discussed. The patient understands and wishes to proceed with the procedure. Written consent was obtained. Ultrasound was performed to localize and mark an adequate pocket of fluid in the left chest. The area was then prepped and draped in the normal sterile fashion. 1% Lidocaine was used for local anesthesia. Under ultrasound guidance a 6 Fr Safe-T-Centesis catheter was introduced. Thoracentesis was performed. The catheter was removed and a dressing applied. FINDINGS: A total of approximately 250 mL of hazy, dark yellow fluid was removed. Samples were sent to the laboratory as requested by the clinical team. IMPRESSION: Successful ultrasound guided left thoracentesis yielding 250 mL of pleural fluid.  Read by: Ascencion Dike PA-C Electronically Signed   By: Corrie Mckusick D.O.   On: 03/18/2020 13:56     ASSESSMENT/PLAN:  This isa very pleasant53 year old Caucasian male who was diagnosed with stage IV non-small cell lung cancer, adenocarcinoma with a positive EGFR resistant mutation in exon 20.He may be eligible for targeted therapy in the future if he progresses on his current treatment.He presented with extensive disease involving the right upper lobe, right middle lobe, and right lower lobe with extensive pleural-based metastases. He also has mediastinal and hilar adenopathy and a rightmalignant pleural effusion. He also has metastatic disease to the brain and a suspicious liver lesion.  He completed SRS treatment to the metastatic disease to the brain under care of Dr. Tammi Klippel.  He had a pleurex catheter placed under the care of Dr. Roxan Hockey for his malignant pleural effusion.This was removed 07/11/2019  He recently had [place] treatment to the another suspicious brain lesion on 01/23/20 under the  care of Dr. Tammi Klippel.  He is currently undergoing systemic chemotherapy with carboplatin AUC of 5, Alimta 500 mg/m, and Keytruda 200 mg IV every 3 weeks. He is status post17cycles. Starting from cycle #5, he has been on maintenance Alimta 500 mg/m2 and Keytruda 200 mg IV every 3 weeks.   He is currently on a high-dose prednisone taper. He is on 10 mg daily of his taper today.   The patient was seen with Dr. Julien Nordmann.  Labs were reviewed.  Dr. Julien Nordmann recommends the patient resuming the patients treatment with Keytruda and Alimta. However, Dr. Julien Nordmann strongly encouraged the patient to call us sooner if he develops new or worsening respiratory symptoms as this may have been immunotherapy mediated. If he develops recurrent symptoms, we will likely discontinue Keytruda.   We will see him back for follow-up visit in 3 weeks for evaluation before starting cycle #18.  The patient was advised  to call immediately if he has any concerning symptoms in the interval. The patient voices understanding of current disease status and treatment options and is in agreement with the current care plan. All questions were answered. The patient knows to call the clinic with any problems, questions or concerns. We can certainly see the patient much sooner if necessary  No orders of the defined types were placed in this encounter.    Shereese Bonnie L Dawood Spitler, PA-C 04/02/20  ADDENDUM: Hematology/Oncology Attending: I had a face-to-face encounter with the patient today. I recommended his care plan.  This is a very pleasant 69 years old white male with metastatic non-small cell lung cancer, adenocarcinoma with positive EGFR mutation in exon 20.  The patient started systemic chemotherapy with carboplatin, Alimta and Keytruda for 4 cycles.  He is currently on maintenance treatment with Alimta and Keytruda every 3 weeks status post 13 more cycles.  His last scan showed no concerning findings for disease progression but the patient had evidence for left pleural effusion and he underwent ultrasound-guided thoracentesis on 03/18/2020 with drainage of 250 mL of pleural fluid.  The cytology showed reactive mesothelial cells. His scan also showed bilateral pleural nodularities and was concerning for immunotherapy mediated pneumonitis.  He was treated with a course of taper prednisone.  He is feeling much better and has significant improvement in his symptoms. I recommended for the patient to resume his treatment with maintenance Alimta and Keytruda and he will proceed with cycle #18 today. He will come back for follow-up visit in 3 weeks for evaluation before starting cycle #19. The patient was advised to call immediately if he has any concerning symptoms in the interval.  Disclaimer: This note was dictated with voice recognition software. Similar sounding words can inadvertently be transcribed and may be missed  upon review. Eilleen Kempf, MD 04/02/20

## 2020-04-02 ENCOUNTER — Encounter: Payer: Self-pay | Admitting: Physician Assistant

## 2020-04-02 ENCOUNTER — Inpatient Hospital Stay: Payer: Medicare Other

## 2020-04-02 ENCOUNTER — Inpatient Hospital Stay (HOSPITAL_BASED_OUTPATIENT_CLINIC_OR_DEPARTMENT_OTHER): Payer: Medicare Other | Admitting: Physician Assistant

## 2020-04-02 ENCOUNTER — Inpatient Hospital Stay: Payer: Medicare Other | Attending: Internal Medicine

## 2020-04-02 ENCOUNTER — Other Ambulatory Visit: Payer: Self-pay

## 2020-04-02 VITALS — BP 137/79 | Temp 98.4°F | Resp 20 | Ht 71.0 in | Wt 194.4 lb

## 2020-04-02 DIAGNOSIS — C787 Secondary malignant neoplasm of liver and intrahepatic bile duct: Secondary | ICD-10-CM | POA: Insufficient documentation

## 2020-04-02 DIAGNOSIS — Z1152 Encounter for screening for COVID-19: Secondary | ICD-10-CM | POA: Insufficient documentation

## 2020-04-02 DIAGNOSIS — Z5111 Encounter for antineoplastic chemotherapy: Secondary | ICD-10-CM

## 2020-04-02 DIAGNOSIS — C3411 Malignant neoplasm of upper lobe, right bronchus or lung: Secondary | ICD-10-CM | POA: Insufficient documentation

## 2020-04-02 DIAGNOSIS — J189 Pneumonia, unspecified organism: Secondary | ICD-10-CM | POA: Diagnosis not present

## 2020-04-02 DIAGNOSIS — R590 Localized enlarged lymph nodes: Secondary | ICD-10-CM | POA: Insufficient documentation

## 2020-04-02 DIAGNOSIS — C7931 Secondary malignant neoplasm of brain: Secondary | ICD-10-CM | POA: Diagnosis not present

## 2020-04-02 DIAGNOSIS — R05 Cough: Secondary | ICD-10-CM | POA: Diagnosis not present

## 2020-04-02 DIAGNOSIS — Z79899 Other long term (current) drug therapy: Secondary | ICD-10-CM | POA: Insufficient documentation

## 2020-04-02 DIAGNOSIS — R509 Fever, unspecified: Secondary | ICD-10-CM | POA: Diagnosis not present

## 2020-04-02 DIAGNOSIS — C3491 Malignant neoplasm of unspecified part of right bronchus or lung: Secondary | ICD-10-CM

## 2020-04-02 DIAGNOSIS — J910 Malignant pleural effusion: Secondary | ICD-10-CM | POA: Insufficient documentation

## 2020-04-02 DIAGNOSIS — Z5112 Encounter for antineoplastic immunotherapy: Secondary | ICD-10-CM

## 2020-04-02 DIAGNOSIS — E079 Disorder of thyroid, unspecified: Secondary | ICD-10-CM

## 2020-04-02 DIAGNOSIS — Z95828 Presence of other vascular implants and grafts: Secondary | ICD-10-CM

## 2020-04-02 LAB — CBC WITH DIFFERENTIAL (CANCER CENTER ONLY)
Abs Immature Granulocytes: 0.02 10*3/uL (ref 0.00–0.07)
Basophils Absolute: 0 10*3/uL (ref 0.0–0.1)
Basophils Relative: 0 %
Eosinophils Absolute: 0 10*3/uL (ref 0.0–0.5)
Eosinophils Relative: 0 %
HCT: 37.7 % — ABNORMAL LOW (ref 39.0–52.0)
Hemoglobin: 12.5 g/dL — ABNORMAL LOW (ref 13.0–17.0)
Immature Granulocytes: 0 %
Lymphocytes Relative: 5 %
Lymphs Abs: 0.4 10*3/uL — ABNORMAL LOW (ref 0.7–4.0)
MCH: 32.6 pg (ref 26.0–34.0)
MCHC: 33.2 g/dL (ref 30.0–36.0)
MCV: 98.2 fL (ref 80.0–100.0)
Monocytes Absolute: 0.4 10*3/uL (ref 0.1–1.0)
Monocytes Relative: 6 %
Neutro Abs: 6.5 10*3/uL (ref 1.7–7.7)
Neutrophils Relative %: 89 %
Platelet Count: 210 10*3/uL (ref 150–400)
RBC: 3.84 MIL/uL — ABNORMAL LOW (ref 4.22–5.81)
RDW: 14.1 % (ref 11.5–15.5)
WBC Count: 7.3 10*3/uL (ref 4.0–10.5)
nRBC: 0 % (ref 0.0–0.2)

## 2020-04-02 LAB — CMP (CANCER CENTER ONLY)
ALT: 27 U/L (ref 0–44)
AST: 19 U/L (ref 15–41)
Albumin: 3.1 g/dL — ABNORMAL LOW (ref 3.5–5.0)
Alkaline Phosphatase: 61 U/L (ref 38–126)
Anion gap: 8 (ref 5–15)
BUN: 20 mg/dL (ref 8–23)
CO2: 27 mmol/L (ref 22–32)
Calcium: 8.8 mg/dL — ABNORMAL LOW (ref 8.9–10.3)
Chloride: 102 mmol/L (ref 98–111)
Creatinine: 0.9 mg/dL (ref 0.61–1.24)
GFR, Est AFR Am: >60 mL/min (ref >60)
GFR, Estimated: >60 mL/min (ref >60)
Glucose, Bld: 102 mg/dL — ABNORMAL HIGH (ref 70–99)
Potassium: 4 mmol/L (ref 3.5–5.1)
Sodium: 137 mmol/L (ref 135–145)
Total Bilirubin: 0.4 mg/dL (ref 0.3–1.2)
Total Protein: 6.1 g/dL — ABNORMAL LOW (ref 6.5–8.1)

## 2020-04-02 LAB — TSH: TSH: 0.66 u[IU]/mL (ref 0.320–4.118)

## 2020-04-02 MED ORDER — SODIUM CHLORIDE 0.9% FLUSH
10.0000 mL | INTRAVENOUS | Status: DC | PRN
  Administered 2020-04-02: 10 mL
  Filled 2020-04-02: qty 10

## 2020-04-02 MED ORDER — SODIUM CHLORIDE 0.9 % IV SOLN
Freq: Once | INTRAVENOUS | Status: AC
  Filled 2020-04-02: qty 250

## 2020-04-02 MED ORDER — SODIUM CHLORIDE 0.9 % IV SOLN
475.0000 mg/m2 | Freq: Once | INTRAVENOUS | Status: AC
  Administered 2020-04-02: 1000 mg via INTRAVENOUS
  Filled 2020-04-02: qty 40

## 2020-04-02 MED ORDER — PROCHLORPERAZINE MALEATE 10 MG PO TABS: 10.0000 mg | ORAL_TABLET | Freq: Once | ORAL | Status: DC

## 2020-04-02 MED ORDER — SODIUM CHLORIDE 0.9 % IV SOLN
200.0000 mg | Freq: Once | INTRAVENOUS | Status: AC
  Administered 2020-04-02: 200 mg via INTRAVENOUS
  Filled 2020-04-02: qty 8

## 2020-04-02 MED ORDER — HEPARIN SOD (PORK) LOCK FLUSH 100 UNIT/ML IV SOLN
500.0000 [IU] | Freq: Once | INTRAVENOUS | Status: AC | PRN
  Administered 2020-04-02: 500 [IU]
  Filled 2020-04-02: qty 5

## 2020-04-02 NOTE — Progress Notes (Signed)
Pt. states his spouse gives him B12 shots at home every 3 weeks.

## 2020-04-02 NOTE — Patient Instructions (Signed)
[facility] Discharge Instructions for Patients Receiving Chemotherapy  Today you received the following immunotherapy agent: Pembrolizumab (Keytruda) and chemotherapy agent: Pemetrexed (Alimta).  To help prevent nausea and vomiting after your treatment, we encourage you to take your nausea medication as directed by your MD   If you develop nausea and vomiting that is not controlled by your nausea medication, call the clinic.   BELOW ARE SYMPTOMS THAT SHOULD BE REPORTED IMMEDIATELY:  *FEVER GREATER THAN 100.5 F  *CHILLS WITH OR WITHOUT FEVER  NAUSEA AND VOMITING THAT IS NOT CONTROLLED WITH YOUR NAUSEA MEDICATION  *UNUSUAL SHORTNESS OF BREATH  *UNUSUAL BRUISING OR BLEEDING  TENDERNESS IN MOUTH AND THROAT WITH OR WITHOUT PRESENCE OF ULCERS  *URINARY PROBLEMS  *BOWEL PROBLEMS  UNUSUAL RASH Items with * indicate a potential emergency and should be followed up as soon as possible.  Feel free to call the clinic should you have any questions or concerns. The clinic phone number is [phone].  Please show the [place] at check-in to the Emergency Department and triage nurse.

## 2020-04-04 ENCOUNTER — Telehealth: Payer: Self-pay | Admitting: Physician Assistant

## 2020-04-04 NOTE — Telephone Encounter (Signed)
Scheduled per 08/10 los, patient has been called and notified. 

## 2020-04-05 ENCOUNTER — Encounter: Payer: Self-pay | Admitting: *Deleted

## 2020-04-08 ENCOUNTER — Encounter: Payer: Self-pay | Admitting: *Deleted

## 2020-04-09 ENCOUNTER — Other Ambulatory Visit: Payer: Self-pay | Admitting: Medical

## 2020-04-09 ENCOUNTER — Inpatient Hospital Stay (HOSPITAL_BASED_OUTPATIENT_CLINIC_OR_DEPARTMENT_OTHER): Payer: Medicare Other | Admitting: Medical

## 2020-04-09 ENCOUNTER — Other Ambulatory Visit: Payer: Self-pay

## 2020-04-09 ENCOUNTER — Ambulatory Visit (HOSPITAL_COMMUNITY)
Admission: RE | Admit: 2020-04-09 | Discharge: 2020-04-09 | Disposition: A | Discharge status: Home / Self Care | Payer: Medicare Other | Source: Ambulatory Visit | Attending: Medical | Admitting: Medical

## 2020-04-09 VITALS — BP 145/85 | HR 96 | Temp 100.9°F | Resp 18 | Ht 71.0 in | Wt 194.9 lb

## 2020-04-09 DIAGNOSIS — C3491 Malignant neoplasm of unspecified part of right bronchus or lung: Secondary | ICD-10-CM | POA: Diagnosis not present

## 2020-04-09 DIAGNOSIS — Z5111 Encounter for antineoplastic chemotherapy: Secondary | ICD-10-CM

## 2020-04-09 DIAGNOSIS — Z5112 Encounter for antineoplastic immunotherapy: Secondary | ICD-10-CM

## 2020-04-09 DIAGNOSIS — R509 Fever, unspecified: Secondary | ICD-10-CM

## 2020-04-09 DIAGNOSIS — J189 Pneumonia, unspecified organism: Secondary | ICD-10-CM | POA: Diagnosis not present

## 2020-04-09 DIAGNOSIS — R059 Cough, unspecified: Secondary | ICD-10-CM

## 2020-04-09 DIAGNOSIS — C3490 Malignant neoplasm of unspecified part of unspecified bronchus or lung: Secondary | ICD-10-CM | POA: Diagnosis not present

## 2020-04-09 DIAGNOSIS — C3411 Malignant neoplasm of upper lobe, right bronchus or lung: Secondary | ICD-10-CM | POA: Diagnosis not present

## 2020-04-09 DIAGNOSIS — R05 Cough: Secondary | ICD-10-CM

## 2020-04-09 DIAGNOSIS — C787 Secondary malignant neoplasm of liver and intrahepatic bile duct: Secondary | ICD-10-CM | POA: Diagnosis not present

## 2020-04-09 DIAGNOSIS — J90 Pleural effusion, not elsewhere classified: Secondary | ICD-10-CM | POA: Diagnosis not present

## 2020-04-09 DIAGNOSIS — Z1152 Encounter for screening for COVID-19: Secondary | ICD-10-CM | POA: Diagnosis not present

## 2020-04-09 LAB — SARS CORONAVIRUS 2 (TAT 6-24 HRS): SARS Coronavirus 2: NEGATIVE

## 2020-04-09 MED ORDER — GUAIFENESIN-CODEINE 100-10 MG/5ML PO SOLN: 10.0000 mL | ORAL | 0 refills | Status: DC | PRN

## 2020-04-09 MED ORDER — LORAZEPAM 2 MG PO TABS: 1.0000 mg | ORAL_TABLET | Freq: Every evening | ORAL | 0 refills | Status: DC | PRN

## 2020-04-09 MED ORDER — PREDNISONE 10 MG PO TABS: ORAL_TABLET | ORAL | 0 refills | Status: DC

## 2020-04-10 ENCOUNTER — Encounter (HOSPITAL_COMMUNITY): Payer: Self-pay

## 2020-04-10 ENCOUNTER — Ambulatory Visit (HOSPITAL_COMMUNITY)
Admission: RE | Admit: 2020-04-10 | Discharge: 2020-04-10 | Disposition: A | Discharge status: Home / Self Care | Payer: Medicare Other | Source: Ambulatory Visit | Attending: Physician Assistant | Admitting: Physician Assistant

## 2020-04-10 ENCOUNTER — Other Ambulatory Visit: Payer: Self-pay | Admitting: Physician Assistant

## 2020-04-10 DIAGNOSIS — C3491 Malignant neoplasm of unspecified part of right bronchus or lung: Secondary | ICD-10-CM

## 2020-04-10 DIAGNOSIS — I700 Atherosclerosis of aorta: Secondary | ICD-10-CM | POA: Diagnosis not present

## 2020-04-10 DIAGNOSIS — J90 Pleural effusion, not elsewhere classified: Secondary | ICD-10-CM | POA: Diagnosis not present

## 2020-04-10 DIAGNOSIS — J439 Emphysema, unspecified: Secondary | ICD-10-CM | POA: Diagnosis not present

## 2020-04-10 DIAGNOSIS — J984 Other disorders of lung: Secondary | ICD-10-CM | POA: Diagnosis not present

## 2020-04-10 MED ORDER — SODIUM CHLORIDE (PF) 0.9 % IJ SOLN
INTRAMUSCULAR | Status: AC
  Filled 2020-04-10: qty 50

## 2020-04-10 MED ORDER — IOHEXOL 300 MG/ML  SOLN
75.0000 mL | Freq: Once | INTRAMUSCULAR | Status: AC | PRN
  Administered 2020-04-10: 75 mL via INTRAVENOUS

## 2020-04-10 NOTE — Progress Notes (Signed)
These results were reviewed with Dr. Julien Nordmann and with the patient.

## 2020-04-11 ENCOUNTER — Telehealth: Payer: Self-pay | Admitting: Physician Assistant

## 2020-04-11 ENCOUNTER — Encounter: Payer: Self-pay | Admitting: Medical

## 2020-04-11 NOTE — Telephone Encounter (Signed)
Attempted to call the patient to discuss his scan from yesterday and review the plan with him moving forward, which is the same as what is listed in the most recent office visit/ progress note from 04/09/20. Will attempt to call later or he can call us back at his convenience.

## 2020-04-11 NOTE — Progress Notes (Signed)
[facility] Progress Note   Ethan Brown [id number] 1951/03/07 69 y.o.  Ethan Brown is managed by Dr. Fanny Bien. Ethan Brown   Actively treated with chemotherapy/immunotherapy/hormonal therapy: yes  Current therapy: Alimta and Keytruda   Last treated: 04/02/2020 (cycle #18, day #1)  Next scheduled appointment with provider: 04/23/2020  Assessment: Plan:    Cough - Plan: SARS Coronavirus 2 (TAT 6-24 hrs), predniSONE (DELTASONE) 10 MG tablet, CT CHEST ABDOMEN PELVIS W CONTRAST  Fever, unspecified fever cause - Plan: SARS Coronavirus 2 (TAT 6-24 hrs), predniSONE (DELTASONE) 10 MG tablet, CT CHEST ABDOMEN PELVIS W CONTRAST  Pneumonitis - Plan: predniSONE (DELTASONE) 10 MG tablet, CT CHEST ABDOMEN PELVIS W CONTRAST  Adenocarcinoma of right lung, stage 4 ([place]) - Plan: CT CHEST ABDOMEN PELVIS W CONTRAST   Cough, fever, and suspected pneumonitis: Ethan Brown had a chest x-ray completed today which returned showing:  FINDINGS: PA and lateral views the chest.  Stable right chest porta cath. Stable cardiac size and mediastinal contours.  Left pleural effusion appears resolved since 03/12/2020.  Right side pleural thickening or fluid persists with ongoing right lower lung reticulonodular density which has not significantly changed.  No superimposed pneumothorax. No pulmonary edema suspected.  No pneumothorax.  No acute osseous abnormality identified.  Negative visible bowel gas pattern.  IMPRESSION: 1. Persistent right lower lung reticulonodular density since July 20th, suspicious for lymphangitic carcinomatosis in this setting. Associated pleural fluid or thickening on that side. 2. Left-side pleural effusion appears resolved since July.  He has been referred for a restaging CT scan and has been placed on a high-dose prednisone taper.  He was also given a prescription for Robitussin with codeine cough syrup.   Metastatic adenocarcinoma of the  right lung: Ethan Brown continues to be followed by Dr. Julien Nordmann.  He has most recently been treated with maintenance Alimta and Keytruda.  Dr. Julien Nordmann plans to hold further dosing of Keytruda based on a suspected pneumonitis.  Ethan Brown Patient has been referred for restaging CT scans and is scheduled to return to see Dr. Julien Nordmann on 04/23/2020.  He was also given a prescription of Ativan 2 mg with instructions to take 1/2 tablet p.o. nightly as needed for insomnia while taking his high-dose prednisone.  Please see After Visit Summary for patient specific instructions.  Future Appointments  Date Time Provider [place]  04/23/2020  9:30 AM [facility] None  04/23/2020  9:45 AM [facility] [place] FLUSH [facility] None  04/23/2020 10:15 AM Curt Bears, MD [facility] None  04/23/2020 11:15 AM [facility] INFUSION [facility] None  05/14/2020  9:45 AM [facility] None  05/14/2020 10:00 AM [facility] [place] FLUSH [facility] None  05/14/2020 10:45 AM Curt Bears, MD [facility] None  05/14/2020 11:30 AM [facility] INFUSION [facility] None  06/03/2020  9:15 AM [facility] None  06/03/2020  9:30 AM [facility] [place] FLUSH [facility] None  06/03/2020 10:00 AM Heilingoetter, Cassandra L, PA-C [facility] None  06/03/2020 11:00 AM [facility] INFUSION [facility] None  06/25/2020  9:30 AM [facility] None  06/25/2020  9:45 AM [facility] [place] FLUSH [facility] None  06/25/2020 10:15 AM Curt Bears, MD [facility] None    Orders Placed This Encounter  Procedures  . SARS Coronavirus 2 (TAT 6-24 hrs)  . CT CHEST ABDOMEN PELVIS W CONTRAST       Subjective:   Patient ID:  Ethan Brown is a 69 y.o. (DOB Mar 19, 1951) male.  Chief Complaint: No chief complaint on file.   HPI  Ethan Brown is a 69 y.o. male with a diagnosis of metastatic adenocarcinoma of the right lung.  He is managed by Dr. Julien Nordmann and has most recently been  treated with maintenance Alimta and Keytruda.  He presents to the clinic today with an ongoing cough.  He also reports having recent fevers of up to 101.4.  His temperature today returned at 100.9.  He was referred for a chest x-ray earlier today which returned showing:  FINDINGS: PA and lateral views the chest.  Stable right chest porta cath. Stable cardiac size and mediastinal contours.  Left pleural effusion appears resolved since 03/12/2020.  Right side pleural thickening or fluid persists with ongoing right lower lung reticulonodular density which has not significantly changed.  No superimposed pneumothorax. No pulmonary edema suspected.  No pneumothorax.  No acute osseous abnormality identified.  Negative visible bowel gas pattern.  IMPRESSION: 1. Persistent right lower lung reticulonodular density since July 20th, suspicious for lymphangitic carcinomatosis in this setting. Associated pleural fluid or thickening on that side. 2. Left-side pleural effusion appears resolved since July.  He has been treated recently for pneumonitis and was given a high-dose prednisone taper.  Medications: I have reviewed the patient's current medications.  Allergies: No Known Allergies  Past Medical History:  Diagnosis Date  . Borderline hypertension   . Brain metastasis ([place]) dx'd 01/2019  . Detached vitreous humor, left   . Dyspnea   . ED (erectile dysfunction) of organic origin   . Hyperlipidemia   . Hypertension   . Lens subluxation, left   . Lung cancer ([place]) dx'd 01/2019   Stage IV  . Migraine headache    takes PRN Imitrex  . Pseudophakia   . TGA (transient global amnesia) 2017    Past Surgical History:  Procedure Laterality Date  . CAROTID DOPPLERS  09/2017   Mild non-obstructive disease  . CATARACT EXTRACTION, BILATERAL    . cataract, left  2018  . CHEST TUBE INSERTION Right 03/29/2019   Procedure: INSERTION PLEURAL DRAINAGE CATHETER;  Surgeon: [name], MD;   Location: [facility];  Service: Thoracic;  Laterality: Right;  . COLONOSCOPY  2016   clear, repeat in 10 yrs   . Eye surgeries     , Lens attachment.  Vitrectomy  . FEMORAL HERNIA REPAIR    . HERNIA REPAIR    . IR THORACENTESIS ASP PLEURAL SPACE W/IMG GUIDE  02/22/2019  . IR THORACENTESIS ASP PLEURAL SPACE W/IMG GUIDE  03/13/2019  . IR THORACENTESIS ASP PLEURAL SPACE W/IMG GUIDE  03/18/2020  . LUMBAR LAMINECTOMY    . PLEURAL BIOPSY Right 03/29/2019   Procedure: PLEURAL BIOPSY;  Surgeon: [name], MD;  Location: [facility];  Service: Thoracic;  Laterality: Right;  . PORTACATH PLACEMENT N/A 03/29/2019   Procedure: INSERTION PORT-A-CATH;  Surgeon: [name], MD;  Location: [facility];  Service: Thoracic;  Laterality: N/A;  . retinal attachment, right    . sclearl buckle, right    . victrectomy,right    . VIDEO ASSISTED THORACOSCOPY Right 03/29/2019   Procedure: VIDEO ASSISTED THORACOSCOPY;  Surgeon: [name], MD;  Location: [facility];  Service: Thoracic;  Laterality: Right;    Family History  Problem Relation Age of Onset  . Alcohol abuse Mother   . Diabetes Father   . Hypertension Father   . Prostate cancer Father   . Cancer Father   . Heart attack Maternal Grandfather 78    Social History   Socioeconomic History  .  Marital status: Married    Spouse name: [name]  . Number of children: 3  . Years of education: Not on file  . Highest education level: Master's degree (e.g., MA, MS, MEng, MEd, MSW, MBA)  Occupational History  . Occupation: retired Optometrist  Tobacco Use  . Smoking status: Never Smoker  . Smokeless tobacco: Never Used  Vaping Use  . Vaping Use: Never used  Substance and Sexual Activity  . Alcohol use: Yes    Alcohol/week: 1.0 standard drink    Types: 1 Glasses of wine per week  . Drug use: No  . Sexual activity: Not Currently  Other Topics Concern  . Not on file  Social History Narrative   Married father of 30, grandfather 34.  Lives with  his wife, [name] they recently moved to [place] after living in [place].      He is a retired Engineer, maintenance (IT)   Was previously on an exercise regimen at [employer] 3 days a week for 90 minutes at a time --> he is now hoping to get back into that plan, but is currently now riding his bike at least 30 to 40 minutes a day for 5 days a week and then doing longer walks about 6 days a week.   Social Determinants of Health   Financial Resource Strain:   . Difficulty of Paying Living Expenses: Not on file  Food Insecurity:   . Worried About Charity fundraiser in the Last Year: Not on file  . Ran Out of Food in the Last Year: Not on file  Transportation Needs:   . Lack of Transportation (Medical): Not on file  . Lack of Transportation (Non-Medical): Not on file  Physical Activity:   . Days of Exercise per Week: Not on file  . Minutes of Exercise per Session: Not on file  Stress:   . Feeling of Stress : Not on file  Social Connections:   . Frequency of Communication with Friends and Family: Not on file  . Frequency of Social Gatherings with Friends and Family: Not on file  . Attends Religious Services: Not on file  . Active Member of Clubs or Organizations: Not on file  . Attends Archivist Meetings: Not on file  . Marital Status: Not on file  Intimate Partner Violence:   . Fear of Current or Ex-Partner: Not on file  . Emotionally Abused: Not on file  . Physically Abused: Not on file  . Sexually Abused: Not on file    Past Medical History, Surgical history, Social history, and Family history were reviewed and updated as appropriate.   Please see review of systems for further details on the patient's review from today.   Review of Systems:  Review of Systems  Constitutional: Positive for fever. Negative for chills, diaphoresis and fatigue.  HENT: Negative for congestion, postnasal drip, rhinorrhea and sore throat.   Respiratory: Positive for cough. Negative for  shortness of breath and wheezing.   Cardiovascular: Negative for palpitations.  Neurological: Negative for headaches.    Objective:   Physical Exam:  BP (!) 145/85 (BP Location: Left Arm, Patient Position: Sitting) Comment: notified nurse  Pulse 96   Temp (!) 100.9 F (38.3 C) (Axillary) Comment: notified nurse  Resp 18   Ht '5\' 11"'  (1.803 m)   Wt 194 lb 14.4 oz (88.4 kg)   SpO2 97%   BMI 27.18 kg/m  ECOG: 1  Physical Exam Constitutional:      General: He  is not in acute distress.    Appearance: He is not diaphoretic.  HENT:     Head: Normocephalic and atraumatic.     Right Ear: External ear normal.     Left Ear: External ear normal.     Mouth/Throat:     Pharynx: No oropharyngeal exudate.  Eyes:     General: No scleral icterus.       Right eye: No discharge.        Left eye: No discharge.     Conjunctiva/sclera: Conjunctivae normal.  Cardiovascular:     Rate and Rhythm: Normal rate and regular rhythm.     Heart sounds: Normal heart sounds. No murmur heard.  No friction rub. No gallop.   Pulmonary:     Effort: Pulmonary effort is normal. No respiratory distress.     Breath sounds: Normal breath sounds. No wheezing or rales.  Musculoskeletal:     Cervical back: Normal range of motion and neck supple.  Lymphadenopathy:     Cervical: No cervical adenopathy.  Skin:    General: Skin is warm and dry.     Findings: No erythema or rash.  Neurological:     Mental Status: He is alert.     Coordination: Coordination normal.  Psychiatric:        Behavior: Behavior normal.        Thought Content: Thought content normal.        Judgment: Judgment normal.     Lab Review:     Component Value Date/Time   NA 137 04/02/2020 1002   K 4.0 04/02/2020 1002   CL 102 04/02/2020 1002   CO2 27 04/02/2020 1002   GLUCOSE 102 (H) 04/02/2020 1002   BUN 20 04/02/2020 1002   CREATININE 0.90 04/02/2020 1002   CALCIUM 8.8 (L) 04/02/2020 1002   PROT 6.1 (L) 04/02/2020 1002   ALBUMIN  3.1 (L) 04/02/2020 1002   AST 19 04/02/2020 1002   ALT 27 04/02/2020 1002   ALKPHOS 61 04/02/2020 1002   BILITOT 0.4 04/02/2020 1002   GFRNONAA >60 04/02/2020 1002   GFRAA >60 04/02/2020 1002       Component Value Date/Time   WBC 7.3 04/02/2020 1002   WBC 7.5 03/28/2019 1134   RBC 3.84 (L) 04/02/2020 1002   HGB 12.5 (L) 04/02/2020 1002   HCT 37.7 (L) 04/02/2020 1002   PLT 210 04/02/2020 1002   MCV 98.2 04/02/2020 1002   MCH 32.6 04/02/2020 1002   MCHC 33.2 04/02/2020 1002   RDW 14.1 04/02/2020 1002   LYMPHSABS 0.4 (L) 04/02/2020 1002   MONOABS 0.4 04/02/2020 1002   EOSABS 0.0 04/02/2020 1002   BASOSABS 0.0 04/02/2020 1002   -------------------------------  Imaging from last 24 hours (if applicable):  Radiology interpretation: DG Chest 1 View  Result Date: 03/18/2020 CLINICAL DATA:  Status post right thoracentesis. EXAM: CHEST  1 VIEW COMPARISON:  PA and lateral chest 08/16/2019.  CT chest 03/12/2020. FINDINGS: The patient has a trace left pleural effusion. Small right pleural effusion has decreased since the most recent comparison plain films. No pneumothorax. Streaky opacities in the right lung base are not notably changed. The left lung appears clear. Heart size is normal. Port-A-Cath in place. IMPRESSION: Negative for pneumothorax after left thoracentesis. Trace left pleural effusion noted. Very small right pleural effusion has decreased since the prior plain films. Streaky opacities in the right lung base are unchanged since the prior plain film. Electronically Signed   By: Inge Rise M.D.  On: 03/18/2020 13:52   DG Chest 2 View  Result Date: 04/09/2020 CLINICAL DATA:  69 year old male with metastatic lung cancer and progressive cough. EXAM: CHEST - 2 VIEW COMPARISON:  Portable chest 03/18/2020 and chest CT 03/12/2020 FINDINGS: PA and lateral views the chest. Stable right chest porta cath. Stable cardiac size and mediastinal contours. Left pleural effusion appears  resolved since 03/12/2020. Right side pleural thickening or fluid persists with ongoing right lower lung reticulonodular density which has not significantly changed. No superimposed pneumothorax. No pulmonary edema suspected. No pneumothorax. No acute osseous abnormality identified. Negative visible bowel gas pattern. IMPRESSION: 1. Persistent right lower lung reticulonodular density since July 20th, suspicious for lymphangitic carcinomatosis in this setting. Associated pleural fluid or thickening on that side. 2. Left-side pleural effusion appears resolved since July. Electronically Signed   By: Genevie Ann M.D.   On: 04/09/2020 14:53   CT Chest W Contrast  Result Date: 04/10/2020 CLINICAL DATA:  Metastatic non-small cell lung cancer. Assess treatment response. Cough, fever and shortness of breath. Possible pneumonitis. Patient receiving immunotherapy. EXAM: CT CHEST WITH CONTRAST TECHNIQUE: Multidetector CT imaging of the chest was performed during intravenous contrast administration. CONTRAST:  69m OMNIPAQUE IOHEXOL 300 MG/ML  SOLN COMPARISON:  Chest CT 03/12/2020 FINDINGS: Cardiovascular: The heart is normal in size. No pericardial effusion. The aorta is normal in caliber. No dissection or atherosclerotic calcifications. The branch vessels are patent. No definite coronary artery calcifications. Mediastinum/Nodes: Stable scattered small mediastinal and hilar lymph nodes. Stable 10 mm right hilar node on image number 78/2 Stable 10 mm subcarinal lymph node on image 78/2. The esophagus is grossly normal. Lungs/Pleura: Stable appearing right suprahilar/right upper lobe density measuring a maximum of 18.5 mm on image 84/7. This previously measured approximately 18 mm. Small adjacent nodules also appears stable. The largest measures 7.5 mm on image number 83/7. Surrounding interstitial changes, likely radiation change. Stable pleural and parenchymal nodularity and scarring in the right middle lobe. The largest nodule  medially measures 13.5 mm on image number 117/7. Persistent right pleural effusion with significant overlying atelectasis. There are also areas of interstitial thickening which may be radiation related. The left pleural effusion noted on the prior study has resolved. A few stable scattered subpleural nodules are noted. Patchy ill-defined ground-glass opacities and interstitial thickening has significantly improved since the prior study. This is likely a resolving inflammatory process. Upper Abdomen: No significant upper abdominal findings. No obvious hepatic or adrenal gland lesions. Musculoskeletal: No chest wall mass, supraclavicular or axillary adenopathy. IMPRESSION: 1. Stable appearing right suprahilar/right upper lobe density with surrounding nodularity and surrounding interstitial changes, likely radiation change. Recommend continued close surveillance. 2. Stable pleural and parenchymal nodularity and scarring in the right middle lobe. 3. Stable scattered small mediastinal and hilar lymph nodes. 4. Significant interval improvement in the patchy ill-defined ground-glass opacities and interstitial thickening, likely resolving inflammatory process. I do not see any definite typical findings for drug induced pneumonitis. 5. Resolution of left pleural effusion. 6. Emphysema and aortic atherosclerosis. Aortic Atherosclerosis (ICD10-I70.0) and Emphysema (ICD10-J43.9). Electronically Signed   By: PMarijo SanesM.D.   On: 04/10/2020 18:38   CT ANGIO CHEST PE W OR WO CONTRAST  Result Date: 03/12/2020 CLINICAL DATA:  History of non-small cell lung cancer with shortness of breath EXAM: CT ANGIOGRAPHY CHEST WITH CONTRAST TECHNIQUE: Multidetector CT imaging of the chest was performed using the standard protocol during bolus administration of intravenous contrast. Multiplanar CT image reconstructions and MIPs were obtained to evaluate the vascular  anatomy. CONTRAST:  172m OMNIPAQUE IOHEXOL 350 MG/ML SOLN COMPARISON:   02/09/2020 FINDINGS: Cardiovascular: Thoracic aorta demonstrates mild atherosclerotic calcifications. No aneurysmal dilatation or dissection is identified. No cardiac enlargement is seen. No coronary calcifications are noted. The pulmonary artery shows a normal branching pattern no definitive filling defects to suggest pulmonary emboli are seen at this time. Mild narrowing of the central aspect of the right subclavian vein is seen which may be in part due to the known right chest wall port. Mild chest wall collaterals are noted. Mediastinum/Nodes: Thoracic inlet is within normal limits. Esophagus as visualized is within normal limits. Some soft tissue density is noted adjacent to the distal aspect of the esophagus representing a combination of pleural fluid and subcarinal lymph node. The overall appearance is similar to that seen on the prior CT examination. No significant mediastinal lymph nodes are seen. Lungs/Pleura: Right-sided pleural effusion is stable in appearance from the prior exam. Associated lower lobe consolidation is seen. The pleural margins on the right demonstrates some mild enhancement similar to that noted on the prior exam. Stable nodule in the right middle lobe is again seen along the cardiac border similar to that noted on the prior exam. Increased interstitial edema is noted and there are multifocal somewhat ground-glass nodules identified throughout right lung. Given their abrupt occurrences are felt to be post inflammatory in nature. The possibility of very aggressive carcinoma could not be totally excluded however. Similar changes are noted in the left lung. A large left-sided pleural effusion is noted new from the prior exam. This appears relatively simple in nature. No sizable nodules are noted on the left. Upper Abdomen: Visualized upper abdomen is within normal limits. Musculoskeletal: No acute rib abnormality is noted. Degenerative changes of the thoracic spine are seen. Review of the  MIP images confirms the above findings. IMPRESSION: No definitive pulmonary embolism is identified. There are multiple nodular densities identified within both lungs new from the prior exam of 1 month previous. These likely represent pneumonic infiltrate given their abrupt in nature although the possibility of aggressive neoplastic involvement could not be totally excluded. Stable right-sided pleural effusion is noted with peripheral enhancement New large left-sided pleural effusion. Stable right middle lobe nodule consistent with the known history. Electronically Signed   By: MInez CatalinaM.D.   On: 03/12/2020 16:16   IR THORACENTESIS ASP PLEURAL SPACE W/IMG GUIDE  Result Date: 03/18/2020 INDICATION: History of lung cancer. New left-sided pleural effusion. Request for therapeutic and diagnostic left thoracentesis. EXAM: ULTRASOUND GUIDED LEFT THORACENTESIS MEDICATIONS: None. COMPLICATIONS: None immediate. PROCEDURE: An ultrasound guided thoracentesis was thoroughly discussed with the patient and questions answered. The benefits, risks, alternatives and complications were also discussed. The patient understands and wishes to proceed with the procedure. Written consent was obtained. Ultrasound was performed to localize and mark an adequate pocket of fluid in the left chest. The area was then prepped and draped in the normal sterile fashion. 1% Lidocaine was used for local anesthesia. Under ultrasound guidance a 6 Fr Safe-T-Centesis catheter was introduced. Thoracentesis was performed. The catheter was removed and a dressing applied. FINDINGS: A total of approximately 250 mL of hazy, dark yellow fluid was removed. Samples were sent to the laboratory as requested by the clinical team. IMPRESSION: Successful ultrasound guided left thoracentesis yielding 250 mL of pleural fluid. Read by: KAscencion DikePA-C Electronically Signed   By: JCorrie MckusickD.O.   On: 03/18/2020 13:56      This patient was seen with Dr.  Mohamed with my treatment plan reviewed with him. He expressed agreement with my medical management of this patient.  ADDENDUM: Hematology/Oncology Attending: I had a face-to-face encounter with the patient.  I recommended his care plan.  He is a very pleasant 69 years old white male with history of stage IV non-small cell lung cancer, adenocarcinoma with positive EGFR mutation in exon 20.  The patient started induction treatment with carboplatin, Alimta and Keytruda for 4 cycles with partial response.  He is currently on maintenance treatment with Alimta and Keytruda status post 15 cycles.  His treatment with Beryle Flock was held for several weeks because of suspicious immunotherapy mediated pneumonitis.  The patient resumed his treatment last week after he has significant improvement in his condition with a tapered dose of prednisone. He presented today for evaluation with worsening cough as well as shortness of breath. I recommended for the patient to start high-dose prednisone for the next several weeks. We will also arrange for the patient to have CT scan of the chest for evaluation of his disease and also to rule out immunotherapy mediated pneumonitis. I would consider discontinuing his treatment with Keytruda at this point because of concern of recurrent pneumonitis. We will continue his treatment with single agent Alimta starting from the next cycle of his treatment.  If the patient has any evidence for disease progression will consider him for treatment of the newly approved medication for exon 20 Amivantamab. The patient was advised to call immediately if he has any other concerning symptoms in the interval.  Disclaimer: This note was dictated with voice recognition software. Similar sounding words can inadvertently be transcribed and may be missed upon review. Eilleen Kempf, MD 04/11/20

## 2020-04-13 DIAGNOSIS — Z23 Encounter for immunization: Secondary | ICD-10-CM | POA: Diagnosis not present

## 2020-04-15 ENCOUNTER — Other Ambulatory Visit: Payer: Self-pay | Admitting: Radiation Therapy

## 2020-04-15 DIAGNOSIS — C7949 Secondary malignant neoplasm of other parts of nervous system: Secondary | ICD-10-CM

## 2020-04-15 DIAGNOSIS — C7931 Secondary malignant neoplasm of brain: Secondary | ICD-10-CM

## 2020-04-18 ENCOUNTER — Ambulatory Visit (HOSPITAL_COMMUNITY): Payer: Medicare Other

## 2020-04-23 ENCOUNTER — Other Ambulatory Visit: Payer: Self-pay

## 2020-04-23 ENCOUNTER — Inpatient Hospital Stay: Payer: Medicare Other

## 2020-04-23 ENCOUNTER — Encounter: Payer: Self-pay | Admitting: Internal Medicine

## 2020-04-23 ENCOUNTER — Inpatient Hospital Stay (HOSPITAL_BASED_OUTPATIENT_CLINIC_OR_DEPARTMENT_OTHER): Payer: Medicare Other | Admitting: Internal Medicine

## 2020-04-23 VITALS — BP 138/80 | HR 80 | Temp 99.0°F | Resp 20 | Ht 71.0 in | Wt 189.7 lb

## 2020-04-23 DIAGNOSIS — Z1152 Encounter for screening for COVID-19: Secondary | ICD-10-CM | POA: Diagnosis not present

## 2020-04-23 DIAGNOSIS — C7931 Secondary malignant neoplasm of brain: Secondary | ICD-10-CM

## 2020-04-23 DIAGNOSIS — R053 Chronic cough: Secondary | ICD-10-CM

## 2020-04-23 DIAGNOSIS — Z5111 Encounter for antineoplastic chemotherapy: Secondary | ICD-10-CM | POA: Diagnosis not present

## 2020-04-23 DIAGNOSIS — R05 Cough: Secondary | ICD-10-CM | POA: Diagnosis not present

## 2020-04-23 DIAGNOSIS — C3411 Malignant neoplasm of upper lobe, right bronchus or lung: Secondary | ICD-10-CM | POA: Diagnosis not present

## 2020-04-23 DIAGNOSIS — E079 Disorder of thyroid, unspecified: Secondary | ICD-10-CM

## 2020-04-23 DIAGNOSIS — Z95828 Presence of other vascular implants and grafts: Secondary | ICD-10-CM

## 2020-04-23 DIAGNOSIS — C3491 Malignant neoplasm of unspecified part of right bronchus or lung: Secondary | ICD-10-CM | POA: Diagnosis not present

## 2020-04-23 DIAGNOSIS — Z5112 Encounter for antineoplastic immunotherapy: Secondary | ICD-10-CM | POA: Diagnosis not present

## 2020-04-23 DIAGNOSIS — C787 Secondary malignant neoplasm of liver and intrahepatic bile duct: Secondary | ICD-10-CM | POA: Diagnosis not present

## 2020-04-23 DIAGNOSIS — J189 Pneumonia, unspecified organism: Secondary | ICD-10-CM | POA: Diagnosis not present

## 2020-04-23 LAB — CBC WITH DIFFERENTIAL (CANCER CENTER ONLY)
Abs Immature Granulocytes: 0.08 10*3/uL — ABNORMAL HIGH (ref 0.00–0.07)
Basophils Absolute: 0 10*3/uL (ref 0.0–0.1)
Basophils Relative: 0 %
Eosinophils Absolute: 0 10*3/uL (ref 0.0–0.5)
Eosinophils Relative: 0 %
HCT: 37.4 % — ABNORMAL LOW (ref 39.0–52.0)
Hemoglobin: 12.5 g/dL — ABNORMAL LOW (ref 13.0–17.0)
Immature Granulocytes: 1 %
Lymphocytes Relative: 3 %
Lymphs Abs: 0.3 10*3/uL — ABNORMAL LOW (ref 0.7–4.0)
MCH: 33 pg (ref 26.0–34.0)
MCHC: 33.4 g/dL (ref 30.0–36.0)
MCV: 98.7 fL (ref 80.0–100.0)
Monocytes Absolute: 0.3 10*3/uL (ref 0.1–1.0)
Monocytes Relative: 2 %
Neutro Abs: 10 10*3/uL — ABNORMAL HIGH (ref 1.7–7.7)
Neutrophils Relative %: 94 %
Platelet Count: 246 10*3/uL (ref 150–400)
RBC: 3.79 MIL/uL — ABNORMAL LOW (ref 4.22–5.81)
RDW: 15.1 % (ref 11.5–15.5)
WBC Count: 10.7 10*3/uL — ABNORMAL HIGH (ref 4.0–10.5)
nRBC: 0 % (ref 0.0–0.2)

## 2020-04-23 LAB — CMP (CANCER CENTER ONLY)
ALT: 20 U/L (ref 0–44)
AST: 21 U/L (ref 15–41)
Albumin: 3 g/dL — ABNORMAL LOW (ref 3.5–5.0)
Alkaline Phosphatase: 61 U/L (ref 38–126)
Anion gap: 9 (ref 5–15)
BUN: 23 mg/dL (ref 8–23)
CO2: 29 mmol/L (ref 22–32)
Calcium: 9.5 mg/dL (ref 8.9–10.3)
Chloride: 100 mmol/L (ref 98–111)
Creatinine: 0.99 mg/dL (ref 0.61–1.24)
GFR, Est AFR Am: >60 mL/min (ref >60)
GFR, Estimated: >60 mL/min (ref >60)
Glucose, Bld: 115 mg/dL — ABNORMAL HIGH (ref 70–99)
Potassium: 3.9 mmol/L (ref 3.5–5.1)
Sodium: 138 mmol/L (ref 135–145)
Total Bilirubin: 0.4 mg/dL (ref 0.3–1.2)
Total Protein: 6 g/dL — ABNORMAL LOW (ref 6.5–8.1)

## 2020-04-23 LAB — TSH: TSH: 0.392 u[IU]/mL (ref 0.320–4.118)

## 2020-04-23 MED ORDER — SODIUM CHLORIDE 0.9% FLUSH
10.0000 mL | INTRAVENOUS | Status: DC | PRN
  Administered 2020-04-23: 10 mL
  Filled 2020-04-23: qty 10

## 2020-04-23 MED ORDER — PROCHLORPERAZINE MALEATE 10 MG PO TABS
ORAL_TABLET | ORAL | Status: AC
  Filled 2020-04-23: qty 1

## 2020-04-23 MED ORDER — HEPARIN SOD (PORK) LOCK FLUSH 100 UNIT/ML IV SOLN
500.0000 [IU] | Freq: Once | INTRAVENOUS | Status: AC | PRN
  Administered 2020-04-23: 500 [IU]
  Filled 2020-04-23: qty 5

## 2020-04-23 MED ORDER — SODIUM CHLORIDE 0.9 % IV SOLN
475.0000 mg/m2 | Freq: Once | INTRAVENOUS | Status: AC
  Administered 2020-04-23: 1000 mg via INTRAVENOUS
  Filled 2020-04-23: qty 40

## 2020-04-23 MED ORDER — SODIUM CHLORIDE 0.9 % IV SOLN
Freq: Once | INTRAVENOUS | Status: AC
  Filled 2020-04-23: qty 250

## 2020-04-23 MED ORDER — PROCHLORPERAZINE MALEATE 10 MG PO TABS
10.0000 mg | ORAL_TABLET | Freq: Once | ORAL | Status: AC
  Administered 2020-04-23: 10 mg via ORAL

## 2020-04-23 NOTE — Patient Instructions (Signed)
[facility] Discharge Instructions for Patients Receiving Chemotherapy  Today you received the following chemotherapy agents: pemetrexed.  To help prevent nausea and vomiting after your treatment, we encourage you to take your nausea medication as directed.   If you develop nausea and vomiting that is not controlled by your nausea medication, call the clinic.   BELOW ARE SYMPTOMS THAT SHOULD BE REPORTED IMMEDIATELY:  *FEVER GREATER THAN 100.5 F  *CHILLS WITH OR WITHOUT FEVER  NAUSEA AND VOMITING THAT IS NOT CONTROLLED WITH YOUR NAUSEA MEDICATION  *UNUSUAL SHORTNESS OF BREATH  *UNUSUAL BRUISING OR BLEEDING  TENDERNESS IN MOUTH AND THROAT WITH OR WITHOUT PRESENCE OF ULCERS  *URINARY PROBLEMS  *BOWEL PROBLEMS  UNUSUAL RASH Items with * indicate a potential emergency and should be followed up as soon as possible.  Feel free to call the clinic should you have any questions or concerns. The clinic phone number is [phone].  Please show the [facility] at check-in to the Emergency Department and triage nurse.

## 2020-04-23 NOTE — Progress Notes (Signed)
[facility] Telephone:([phone]   [phone]  OFFICE PROGRESS NOTE  Laurey Morale, MD [place] [address]  DIAGNOSIS: Stage IV (T2b, N2, M1c) presented with right middle lobe lung mass in addition to mediastinal lymphadenopathy as well as malignant right pleural effusion with pleural-based nodules and suspicious right hepatic lesion and the brain metastasis diagnosed in July 2020.  Molecular Biomarkers: [id number] (Exon 20 insertion) 0.2% Dacomitinib,Neratinib,Osimertinib  [id number] 0.1% None   PRIOR THERAPY: None  CURRENT THERAPY: Systemic chemotherapy with carboplatin for AUC of 5, Alimta 500 mg/M2 and Keytruda 200 mg IV every 3 weeks.  First dose April 04, 2019.  Status post 18 cycles  Starting from cycle #5 the patient is on maintenance treatment with Alimta and Keytruda every 3 weeks.  Starting from cycle #19 he will be on single agent Alimta.  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  INTERVAL HISTORY: Ethan Brown 69 y.o. male returns to the clinic today for follow-up visit.  The patient is feeling fine today with no concerning complaints except for shortness of breath with exertion and insomnia.  He had recurrent immunotherapy mediated pneumonitis secondary to treatment with Keytruda.  The patient was started on high-dose prednisone initially 90 mg p.o. daily for 10 days and he is currently on 70 mg p.o. daily and this will be tapered slowly in the next few weeks.  He denied having any current chest pain but has mild cough with no hemoptysis.  He lost few pounds intentionally.  He denied having any headache or visual changes.  He has no nausea, vomiting, diarrhea or constipation.  He is here today for evaluation before starting the next cycle of his treatment with chemotherapy.    MEDICAL HISTORY: Past Medical History:  Diagnosis Date  . Borderline hypertension    . Brain metastasis ([facility]) dx'd 01/2019  . Detached vitreous humor, left   . Dyspnea   . ED (erectile dysfunction) of organic origin   . Hyperlipidemia   . Hypertension   . Lens subluxation, left   . Lung cancer ([facility]) dx'd 01/2019   Stage IV  . Migraine headache    takes PRN Imitrex  . Pseudophakia   . TGA (transient global amnesia) 2017    ALLERGIES:  has No Known Allergies.  MEDICATIONS:  Current Outpatient Medications  Medication Sig Dispense Refill  . chlorpheniramine-HYDROcodone (TUSSIONEX) 10-8 MG/5ML SUER Take 5 mLs by mouth at bedtime as needed for cough. 140 mL 0  . cyanocobalamin (,VITAMIN B-12,) 1000 MCG/ML injection INJECT 1 ML (1,000 MCG TOTAL) INTO THE MUSCLE ONCE FOR 1 DOSE. 5 mL 1  . folic acid (FOLVITE) 1 MG tablet TAKE 1 TABLET BY MOUTH EVERY DAY 90 tablet 1  . guaiFENesin-codeine 100-10 MG/5ML syrup Take 10 mLs by mouth every 4 (four) hours as needed for cough. 240 mL 0  . hydrochlorothiazide (MICROZIDE) 12.5 MG capsule Take 1 capsule (12.5 mg total) by mouth 2 (two) times daily. 180 capsule 2  . lidocaine-prilocaine (EMLA) cream Apply to the Port-A-Cath site 30-60 minutes before treatment 30 g 0  . LORazepam (ATIVAN) 2 MG tablet Take 0.5 tablets (1 mg total) by mouth at bedtime as needed for sleep. 20 tablet 0  . Multiple Vitamin (MULTIVITAMIN WITH MINERALS) TABS tablet Take 1 tablet by mouth daily.    . predniSONE (DELTASONE) 10 MG tablet Please take 9 tablets (90 mg) by mouth daily for 10 days, followed by  7 tablets (70 mg) daily for 7 days, followed by 5 tablets (50 mg) daily for 7  days, followed by 3 tablets (30 mg) daily for 7 days, followed by 1 tablet (10 mg) daily for 7 days, followed by 1/2 tablets for 6 days. 205 tablet 0  . sildenafil (REVATIO) 20 MG tablet TAKE AS DIRECTED 10 tablet 11  . tamsulosin (FLOMAX) 0.4 MG CAPS capsule Take 0.4 mg by mouth daily.     No current facility-administered medications for this visit.    SURGICAL HISTORY:  Past  Surgical History:  Procedure Laterality Date  . CAROTID DOPPLERS  09/2017   Mild non-obstructive disease  . CATARACT EXTRACTION, BILATERAL    . cataract, left  2018  . CHEST TUBE INSERTION Right 03/29/2019   Procedure: INSERTION PLEURAL DRAINAGE CATHETER;  Surgeon: [name], MD;  Location: [facility];  Service: Thoracic;  Laterality: Right;  . COLONOSCOPY  2016   clear, repeat in 10 yrs   . Eye surgeries     , Lens attachment.  Vitrectomy  . FEMORAL HERNIA REPAIR    . HERNIA REPAIR    . IR THORACENTESIS ASP PLEURAL SPACE W/IMG GUIDE  02/22/2019  . IR THORACENTESIS ASP PLEURAL SPACE W/IMG GUIDE  03/13/2019  . IR THORACENTESIS ASP PLEURAL SPACE W/IMG GUIDE  03/18/2020  . LUMBAR LAMINECTOMY    . PLEURAL BIOPSY Right 03/29/2019   Procedure: PLEURAL BIOPSY;  Surgeon: [name], MD;  Location: [facility];  Service: Thoracic;  Laterality: Right;  . PORTACATH PLACEMENT N/A 03/29/2019   Procedure: INSERTION PORT-A-CATH;  Surgeon: [name], MD;  Location: [facility];  Service: Thoracic;  Laterality: N/A;  . retinal attachment, right    . sclearl buckle, right    . victrectomy,right    . VIDEO ASSISTED THORACOSCOPY Right 03/29/2019   Procedure: VIDEO ASSISTED THORACOSCOPY;  Surgeon: [name], MD;  Location: [facility];  Service: Thoracic;  Laterality: Right;    REVIEW OF SYSTEMS:  Constitutional: negative Eyes: negative Ears, nose, mouth, throat, and face: negative Respiratory: positive for cough and dyspnea on exertion Cardiovascular: negative Gastrointestinal: negative Genitourinary:negative Integument/breast: negative Hematologic/lymphatic: negative Musculoskeletal:negative Neurological: negative Behavioral/Psych: negative Endocrine: negative Allergic/Immunologic: negative   PHYSICAL EXAMINATION: General appearance: alert, cooperative and no distress Head: Normocephalic, without obvious abnormality, atraumatic Neck: no adenopathy, no JVD, supple, symmetrical,  trachea midline and thyroid not enlarged, symmetric, no tenderness/mass/nodules Lymph nodes: Cervical, supraclavicular, and axillary nodes normal. Resp: clear to auscultation bilaterally Back: symmetric, no curvature. ROM normal. No CVA tenderness. Cardio: regular rate and rhythm, S1, S2 normal, no murmur, click, rub or gallop GI: soft, non-tender; bowel sounds normal; no masses,  no organomegaly Extremities: edema Trace edema bilaterally Neurologic: Alert and oriented X 3, normal strength and tone. Normal symmetric reflexes. Normal coordination and gait  ECOG PERFORMANCE STATUS: 1 - Symptomatic but completely ambulatory  Blood pressure 138/80, pulse 80, temperature 99 F (37.2 C), temperature source Tympanic, resp. rate 20, height _0  (1.803 m), weight 189 lb 11.2 oz (86 kg), SpO2 98 %.  LABORATORY DATA: Lab Results  Component Value Date   WBC 10.7 (H) 04/23/2020   HGB 12.5 (L) 04/23/2020   HCT 37.4 (L) 04/23/2020   MCV 98.7 04/23/2020   PLT 246 04/23/2020      Chemistry      Component Value Date/Time   NA 137 04/02/2020 1002   K 4.0 04/02/2020 1002   CL 102 04/02/2020 1002   CO2 27 04/02/2020 1002  BUN 20 04/02/2020 1002   CREATININE 0.90 04/02/2020 1002      Component Value Date/Time   CALCIUM 8.8 (L) 04/02/2020 1002   ALKPHOS 61 04/02/2020 1002   AST 19 04/02/2020 1002   ALT 27 04/02/2020 1002   BILITOT 0.4 04/02/2020 1002       RADIOGRAPHIC STUDIES: DG Chest 2 View  Result Date: 04/09/2020 CLINICAL DATA:  69 year old male with metastatic lung cancer and progressive cough. EXAM: CHEST - 2 VIEW COMPARISON:  Portable chest 03/18/2020 and chest CT 03/12/2020 FINDINGS: PA and lateral views the chest. Stable right chest porta cath. Stable cardiac size and mediastinal contours. Left pleural effusion appears resolved since 03/12/2020. Right side pleural thickening or fluid persists with ongoing right lower lung reticulonodular density which has not significantly  changed. No superimposed pneumothorax. No pulmonary edema suspected. No pneumothorax. No acute osseous abnormality identified. Negative visible bowel gas pattern. IMPRESSION: 1. Persistent right lower lung reticulonodular density since July 20th, suspicious for lymphangitic carcinomatosis in this setting. Associated pleural fluid or thickening on that side. 2. Left-side pleural effusion appears resolved since July. Electronically Signed   By: Genevie Ann M.D.   On: 04/09/2020 14:53   CT Chest W Contrast  Result Date: 04/10/2020 CLINICAL DATA:  Metastatic non-small cell lung cancer. Assess treatment response. Cough, fever and shortness of breath. Possible pneumonitis. Patient receiving immunotherapy. EXAM: CT CHEST WITH CONTRAST TECHNIQUE: Multidetector CT imaging of the chest was performed during intravenous contrast administration. CONTRAST:  38m OMNIPAQUE IOHEXOL 300 MG/ML  SOLN COMPARISON:  Chest CT 03/12/2020 FINDINGS: Cardiovascular: The heart is normal in size. No pericardial effusion. The aorta is normal in caliber. No dissection or atherosclerotic calcifications. The branch vessels are patent. No definite coronary artery calcifications. Mediastinum/Nodes: Stable scattered small mediastinal and hilar lymph nodes. Stable 10 mm right hilar node on image number 78/2 Stable 10 mm subcarinal lymph node on image 78/2. The esophagus is grossly normal. Lungs/Pleura: Stable appearing right suprahilar/right upper lobe density measuring a maximum of 18.5 mm on image 84/7. This previously measured approximately 18 mm. Small adjacent nodules also appears stable. The largest measures 7.5 mm on image number 83/7. Surrounding interstitial changes, likely radiation change. Stable pleural and parenchymal nodularity and scarring in the right middle lobe. The largest nodule medially measures 13.5 mm on image number 117/7. Persistent right pleural effusion with significant overlying atelectasis. There are also areas of  interstitial thickening which may be radiation related. The left pleural effusion noted on the prior study has resolved. A few stable scattered subpleural nodules are noted. Patchy ill-defined ground-glass opacities and interstitial thickening has significantly improved since the prior study. This is likely a resolving inflammatory process. Upper Abdomen: No significant upper abdominal findings. No obvious hepatic or adrenal gland lesions. Musculoskeletal: No chest wall mass, supraclavicular or axillary adenopathy. IMPRESSION: 1. Stable appearing right suprahilar/right upper lobe density with surrounding nodularity and surrounding interstitial changes, likely radiation change. Recommend continued close surveillance. 2. Stable pleural and parenchymal nodularity and scarring in the right middle lobe. 3. Stable scattered small mediastinal and hilar lymph nodes. 4. Significant interval improvement in the patchy ill-defined ground-glass opacities and interstitial thickening, likely resolving inflammatory process. I do not see any definite typical findings for drug induced pneumonitis. 5. Resolution of left pleural effusion. 6. Emphysema and aortic atherosclerosis. Aortic Atherosclerosis (ICD10-I70.0) and Emphysema (ICD10-J43.9). Electronically Signed   By: PMarijo SanesM.D.   On: 04/10/2020 18:38    ASSESSMENT AND PLAN: This is a very pleasant 69  years old white male recently diagnosed with a stage IV (T2b, N2, M1C) non-small cell lung cancer, adenocarcinoma with positive EGFR mutation in exon 20 (resistant mutation) diagnosed in July 2020 and presented with extensive disease involving the right upper lobe as well as the right middle and lower lobe with extensive pleural based metastasis as well as mediastinal and hilar disease with malignant pleural effusion as well as liver and brain metastasis. Unfortunately the patient has no actionable mutations based on the molecular studies by [employer].   The patient is  currently undergoing systemic chemotherapy with carboplatin for AUC of 5, Alimta 500 mg/M2 and Keytruda 200 mg IV every 3 weeks status post 18 cycles.  Starting from cycle #5 the patient is on maintenance treatment with Alimta and Keytruda. Beryle Flock was discontinued after cycle #18 secondary to recurrent immunotherapy mediated pneumonitis. Starting from cycle #19 the patient will be on treatment with single agent Alimta every 3 weeks. I recommended for him to continue with a tapered dose of prednisone. For the swelling of the lower extremities, he will continue his current treatment with hydrochlorothiazide. I will see him back for follow-up visit in 3 weeks for evaluation before the next cycle of his treatment. He was advised to call immediately if he has any concerning symptoms in the interval. The patient voices understanding of current disease status and treatment options and is in agreement with the current care plan. All questions were answered. The patient knows to call the clinic with any problems, questions or concerns. We can certainly see the patient much sooner if necessary.  Disclaimer: This note was dictated with voice recognition software. Similar sounding words can inadvertently be transcribed and may not be corrected upon review.

## 2020-05-01 ENCOUNTER — Encounter: Payer: Self-pay | Admitting: Medical

## 2020-05-02 ENCOUNTER — Encounter: Payer: Self-pay | Admitting: Medical

## 2020-05-08 ENCOUNTER — Other Ambulatory Visit: Payer: Medicare Other

## 2020-05-14 ENCOUNTER — Encounter: Payer: Self-pay | Admitting: Internal Medicine

## 2020-05-14 ENCOUNTER — Inpatient Hospital Stay: Payer: Medicare Other

## 2020-05-14 ENCOUNTER — Other Ambulatory Visit: Payer: Self-pay

## 2020-05-14 ENCOUNTER — Inpatient Hospital Stay (HOSPITAL_BASED_OUTPATIENT_CLINIC_OR_DEPARTMENT_OTHER): Payer: Medicare Other | Admitting: Internal Medicine

## 2020-05-14 ENCOUNTER — Inpatient Hospital Stay: Payer: Medicare Other | Attending: Internal Medicine

## 2020-05-14 VITALS — BP 141/81 | HR 92 | Temp 99.0°F | Resp 20 | Ht 71.0 in | Wt 187.2 lb

## 2020-05-14 DIAGNOSIS — C3491 Malignant neoplasm of unspecified part of right bronchus or lung: Secondary | ICD-10-CM | POA: Diagnosis not present

## 2020-05-14 DIAGNOSIS — Z5111 Encounter for antineoplastic chemotherapy: Secondary | ICD-10-CM | POA: Diagnosis not present

## 2020-05-14 DIAGNOSIS — C787 Secondary malignant neoplasm of liver and intrahepatic bile duct: Secondary | ICD-10-CM | POA: Insufficient documentation

## 2020-05-14 DIAGNOSIS — C3411 Malignant neoplasm of upper lobe, right bronchus or lung: Secondary | ICD-10-CM | POA: Diagnosis not present

## 2020-05-14 DIAGNOSIS — Z5112 Encounter for antineoplastic immunotherapy: Secondary | ICD-10-CM | POA: Diagnosis not present

## 2020-05-14 DIAGNOSIS — E079 Disorder of thyroid, unspecified: Secondary | ICD-10-CM

## 2020-05-14 DIAGNOSIS — Z79899 Other long term (current) drug therapy: Secondary | ICD-10-CM | POA: Diagnosis not present

## 2020-05-14 DIAGNOSIS — R05 Cough: Secondary | ICD-10-CM

## 2020-05-14 DIAGNOSIS — C7931 Secondary malignant neoplasm of brain: Secondary | ICD-10-CM | POA: Insufficient documentation

## 2020-05-14 DIAGNOSIS — C3490 Malignant neoplasm of unspecified part of unspecified bronchus or lung: Secondary | ICD-10-CM

## 2020-05-14 DIAGNOSIS — Z95828 Presence of other vascular implants and grafts: Secondary | ICD-10-CM

## 2020-05-14 DIAGNOSIS — J910 Malignant pleural effusion: Secondary | ICD-10-CM | POA: Diagnosis not present

## 2020-05-14 DIAGNOSIS — R053 Chronic cough: Secondary | ICD-10-CM

## 2020-05-14 LAB — CBC WITH DIFFERENTIAL (CANCER CENTER ONLY)
Abs Immature Granulocytes: 0.11 10*3/uL — ABNORMAL HIGH (ref 0.00–0.07)
Basophils Absolute: 0 10*3/uL (ref 0.0–0.1)
Basophils Relative: 0 %
Eosinophils Absolute: 0 10*3/uL (ref 0.0–0.5)
Eosinophils Relative: 0 %
HCT: 37.3 % — ABNORMAL LOW (ref 39.0–52.0)
Hemoglobin: 12.3 g/dL — ABNORMAL LOW (ref 13.0–17.0)
Immature Granulocytes: 2 %
Lymphocytes Relative: 9 %
Lymphs Abs: 0.5 10*3/uL — ABNORMAL LOW (ref 0.7–4.0)
MCH: 31.3 pg (ref 26.0–34.0)
MCHC: 33 g/dL (ref 30.0–36.0)
MCV: 94.9 fL (ref 80.0–100.0)
Monocytes Absolute: 0.5 10*3/uL (ref 0.1–1.0)
Monocytes Relative: 7 %
Neutro Abs: 5.2 10*3/uL (ref 1.7–7.7)
Neutrophils Relative %: 82 %
Platelet Count: 235 10*3/uL (ref 150–400)
RBC: 3.93 MIL/uL — ABNORMAL LOW (ref 4.22–5.81)
RDW: 15.4 % (ref 11.5–15.5)
WBC Count: 6.4 10*3/uL (ref 4.0–10.5)
nRBC: 0 % (ref 0.0–0.2)

## 2020-05-14 LAB — CMP (CANCER CENTER ONLY)
ALT: 25 U/L (ref 0–44)
AST: 29 U/L (ref 15–41)
Albumin: 2.5 g/dL — ABNORMAL LOW (ref 3.5–5.0)
Alkaline Phosphatase: 56 U/L (ref 38–126)
Anion gap: 8 (ref 5–15)
BUN: 25 mg/dL — ABNORMAL HIGH (ref 8–23)
CO2: 26 mmol/L (ref 22–32)
Calcium: 8.6 mg/dL — ABNORMAL LOW (ref 8.9–10.3)
Chloride: 103 mmol/L (ref 98–111)
Creatinine: 0.93 mg/dL (ref 0.61–1.24)
GFR, Est AFR Am: >60 mL/min (ref >60)
GFR, Estimated: >60 mL/min (ref >60)
Glucose, Bld: 97 mg/dL (ref 70–99)
Potassium: 3.5 mmol/L (ref 3.5–5.1)
Sodium: 137 mmol/L (ref 135–145)
Total Bilirubin: 0.4 mg/dL (ref 0.3–1.2)
Total Protein: 5.7 g/dL — ABNORMAL LOW (ref 6.5–8.1)

## 2020-05-14 LAB — TSH: TSH: 1.124 u[IU]/mL (ref 0.320–4.118)

## 2020-05-14 MED ORDER — SODIUM CHLORIDE 0.9% FLUSH
10.0000 mL | INTRAVENOUS | Status: DC | PRN
  Administered 2020-05-14: 10 mL
  Filled 2020-05-14: qty 10

## 2020-05-14 MED ORDER — SODIUM CHLORIDE 0.9 % IV SOLN
475.0000 mg/m2 | Freq: Once | INTRAVENOUS | Status: AC
  Administered 2020-05-14: 1000 mg via INTRAVENOUS
  Filled 2020-05-14: qty 40

## 2020-05-14 MED ORDER — PROCHLORPERAZINE MALEATE 10 MG PO TABS
10.0000 mg | ORAL_TABLET | Freq: Once | ORAL | Status: AC
  Administered 2020-05-14: 10 mg via ORAL

## 2020-05-14 MED ORDER — PROCHLORPERAZINE MALEATE 10 MG PO TABS
ORAL_TABLET | ORAL | Status: AC
  Filled 2020-05-14: qty 1

## 2020-05-14 MED ORDER — SODIUM CHLORIDE 0.9 % IV SOLN
Freq: Once | INTRAVENOUS | Status: AC
  Filled 2020-05-14: qty 250

## 2020-05-14 MED ORDER — HEPARIN SOD (PORK) LOCK FLUSH 100 UNIT/ML IV SOLN
500.0000 [IU] | Freq: Once | INTRAVENOUS | Status: AC | PRN
  Administered 2020-05-14: 500 [IU]
  Filled 2020-05-14: qty 5

## 2020-05-14 MED ORDER — METHYLPREDNISOLONE 4 MG PO TBPK: ORAL_TABLET | ORAL | 0 refills | Status: DC

## 2020-05-14 NOTE — Progress Notes (Signed)
[facility] [phone]   [phone]) [phone]  OFFICE PROGRESS NOTE  Laurey Morale, MD [place] [address]  DIAGNOSIS: Stage IV (T2b, N2, M1c) presented with right middle lobe lung mass in addition to mediastinal lymphadenopathy as well as malignant right pleural effusion with pleural-based nodules and suspicious right hepatic lesion and the brain metastasis diagnosed in July 2020.  Molecular Biomarkers: [id number] (Exon 20 insertion) 0.2% Dacomitinib,Neratinib,Osimertinib  [id number] 0.1% None   PRIOR THERAPY: None  CURRENT THERAPY: Systemic chemotherapy with carboplatin for AUC of 5, Alimta 500 mg/M2 and Keytruda 200 mg IV every 3 weeks.  First dose April 04, 2019.  Status post 18 cycles  Starting from cycle #5 the patient is on maintenance treatment with Alimta and Keytruda every 3 weeks.  Starting from cycle #19 he will be on single agent Alimta.  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  INTERVAL HISTORY: Ethan Brown 69 y.o. male returns to the clinic today for follow-up visit.  The patient is feeling fine except for the persistent cough.  He has Hycodan at home but he does not use it at regular basis.  His cough is much better compared to several weeks ago.  He also had few episodes of diarrhea few nights ago and this completely resolved.  He denied having any current chest pain, shortness of breath or hemoptysis.  He denied having any fever or chills.  He lost 3 pounds after the few episodes of diarrhea.  He denied having any nausea, vomiting or constipation.  He has no headache or visual changes.  He is here today for evaluation before starting cycle #20 of his treatment.   MEDICAL HISTORY: Past Medical History:  Diagnosis Date  . Borderline hypertension   . Brain metastasis ([place]) dx'd 01/2019  . Detached vitreous humor, left   . Dyspnea   . ED (erectile  dysfunction) of organic origin   . Hyperlipidemia   . Hypertension   . Lens subluxation, left   . Lung cancer ([facility]) dx'd 01/2019   Stage IV  . Migraine headache    takes PRN Imitrex  . Pseudophakia   . TGA (transient global amnesia) 2017    ALLERGIES:  has No Known Allergies.  MEDICATIONS:  Current Outpatient Medications  Medication Sig Dispense Refill  . chlorpheniramine-HYDROcodone (TUSSIONEX) 10-8 MG/5ML SUER Take 5 mLs by mouth at bedtime as needed for cough. 140 mL 0  . cyanocobalamin (,VITAMIN B-12,) 1000 MCG/ML injection INJECT 1 ML (1,000 MCG TOTAL) INTO THE MUSCLE ONCE FOR 1 DOSE. 5 mL 1  . folic acid (FOLVITE) 1 MG tablet TAKE 1 TABLET BY MOUTH EVERY DAY 90 tablet 1  . guaiFENesin-codeine 100-10 MG/5ML syrup Take 10 mLs by mouth every 4 (four) hours as needed for cough. 240 mL 0  . hydrochlorothiazide (MICROZIDE) 12.5 MG capsule Take 1 capsule (12.5 mg total) by mouth 2 (two) times daily. 180 capsule 2  . lidocaine-prilocaine (EMLA) cream Apply to the Port-A-Cath site 30-60 minutes before treatment 30 g 0  . LORazepam (ATIVAN) 2 MG tablet Take 0.5 tablets (1 mg total) by mouth at bedtime as needed for sleep. 20 tablet 0  . Multiple Vitamin (MULTIVITAMIN WITH MINERALS) TABS tablet Take 1 tablet by mouth daily.    . predniSONE (DELTASONE) 10 MG tablet Please take 9 tablets (90 mg) by mouth daily for 10 days, followed by 7 tablets (70 mg) daily for 7 days, followed by  5 tablets (50 mg) daily for 7  days, followed by 3 tablets (30 mg) daily for 7 days, followed by 1 tablet (10 mg) daily for 7 days, followed by 1/2 tablets for 6 days. 205 tablet 0  . sildenafil (REVATIO) 20 MG tablet TAKE AS DIRECTED 10 tablet 11  . tamsulosin (FLOMAX) 0.4 MG CAPS capsule Take 0.4 mg by mouth daily.     No current facility-administered medications for this visit.   Facility-Administered Medications Ordered in Other Visits  Medication Dose Route Frequency Provider Last Rate Last Admin  . sodium  chloride flush (NS) 0.9 % injection 10 mL  10 mL Intracatheter PRN Curt Bears, MD   10 mL at 05/14/20 0954    SURGICAL HISTORY:  Past Surgical History:  Procedure Laterality Date  . CAROTID DOPPLERS  09/2017   Mild non-obstructive disease  . CATARACT EXTRACTION, BILATERAL    . cataract, left  2018  . CHEST TUBE INSERTION Right 03/29/2019   Procedure: INSERTION PLEURAL DRAINAGE CATHETER;  Surgeon: [name], MD;  Location: [facility];  Service: Thoracic;  Laterality: Right;  . COLONOSCOPY  2016   clear, repeat in 10 yrs   . Eye surgeries     , Lens attachment.  Vitrectomy  . FEMORAL HERNIA REPAIR    . HERNIA REPAIR    . IR THORACENTESIS ASP PLEURAL SPACE W/IMG GUIDE  02/22/2019  . IR THORACENTESIS ASP PLEURAL SPACE W/IMG GUIDE  03/13/2019  . IR THORACENTESIS ASP PLEURAL SPACE W/IMG GUIDE  03/18/2020  . LUMBAR LAMINECTOMY    . PLEURAL BIOPSY Right 03/29/2019   Procedure: PLEURAL BIOPSY;  Surgeon: [name], MD;  Location: [facility];  Service: Thoracic;  Laterality: Right;  . PORTACATH PLACEMENT N/A 03/29/2019   Procedure: INSERTION PORT-A-CATH;  Surgeon: [name], MD;  Location: [facility];  Service: Thoracic;  Laterality: N/A;  . retinal attachment, right    . sclearl buckle, right    . victrectomy,right    . VIDEO ASSISTED THORACOSCOPY Right 03/29/2019   Procedure: VIDEO ASSISTED THORACOSCOPY;  Surgeon: [name], MD;  Location: [facility] OR;  Service: Thoracic;  Laterality: Right;    REVIEW OF SYSTEMS:  A comprehensive review of systems was negative except for: Constitutional: positive for fatigue and weight loss Respiratory: positive for cough   PHYSICAL EXAMINATION: General appearance: alert, cooperative, fatigued and no distress Head: Normocephalic, without obvious abnormality, atraumatic Neck: no adenopathy, no JVD, supple, symmetrical, trachea midline and thyroid not enlarged, symmetric, no tenderness/mass/nodules Lymph nodes: Cervical,  supraclavicular, and axillary nodes normal. Resp: clear to auscultation bilaterally Back: symmetric, no curvature. ROM normal. No CVA tenderness. Cardio: regular rate and rhythm, S1, S2 normal, no murmur, click, rub or gallop GI: soft, non-tender; bowel sounds normal; no masses,  no organomegaly Extremities: extremities normal, atraumatic, no cyanosis or edema  ECOG PERFORMANCE STATUS: 1 - Symptomatic but completely ambulatory  Blood pressure (!) 141/81, pulse 92, temperature 99 F (37.2 C), temperature source Tympanic, resp. rate 20, height '5\' 11"'  (1.803 m), weight 187 lb 3.2 oz (84.9 kg), SpO2 98 %.  LABORATORY DATA: Lab Results  Component Value Date   WBC 6.4 05/14/2020   HGB 12.3 (L) 05/14/2020   HCT 37.3 (L) 05/14/2020   MCV 94.9 05/14/2020   PLT 235 05/14/2020      Chemistry      Component Value Date/Time   NA 138 04/23/2020 0955   K 3.9 04/23/2020 0955   CL 100 04/23/2020 0955   CO2  29 04/23/2020 0955   BUN 23 04/23/2020 0955   CREATININE 0.99 04/23/2020 0955      Component Value Date/Time   CALCIUM 9.5 04/23/2020 0955   ALKPHOS 61 04/23/2020 0955   AST 21 04/23/2020 0955   ALT 20 04/23/2020 0955   BILITOT 0.4 04/23/2020 0955       RADIOGRAPHIC STUDIES: No results found.  ASSESSMENT AND PLAN: This is a very pleasant 69 years old white male recently diagnosed with a stage IV (T2b, N2, M1C) non-small cell lung cancer, adenocarcinoma with positive EGFR mutation in exon 20 (resistant mutation) diagnosed in July 2020 and presented with extensive disease involving the right upper lobe as well as the right middle and lower lobe with extensive pleural based metastasis as well as mediastinal and hilar disease with malignant pleural effusion as well as liver and brain metastasis. Unfortunately the patient has no actionable mutations based on the molecular studies by [employer].   The patient is currently undergoing systemic chemotherapy with carboplatin for AUC of 5,  Alimta 500 mg/M2 and Keytruda 200 mg IV every 3 weeks status post 19 cycles.  Starting from cycle #5 the patient is on maintenance treatment with Alimta and Keytruda. Beryle Flock was discontinued after cycle #18 secondary to recurrent immunotherapy mediated pneumonitis. Starting from cycle #19 the patient will be on treatment with single agent Alimta every 3 weeks. The patient continues to tolerate his treatment with Alimta fairly well. I recommended for him to proceed with cycle #20 today as planned. I will see him back for follow-up visit in 3 weeks for evaluation with repeat CT scan of the chest, abdomen pelvis for restaging of his disease. For the persistent cough, I will start the patient on Medrol Dosepak and he will continue with his current cough medication. He was advised to call immediately if he has any concerning symptoms in the interval. The patient voices understanding of current disease status and treatment options and is in agreement with the current care plan. All questions were answered. The patient knows to call the clinic with any problems, questions or concerns. We can certainly see the patient much sooner if necessary.  Disclaimer: This note was dictated with voice recognition software. Similar sounding words can inadvertently be transcribed and may not be corrected upon review.

## 2020-05-14 NOTE — Patient Instructions (Signed)
[facility] Discharge Instructions for Patients Receiving Chemotherapy  Today you received the following chemotherapy agents: alimta  To help prevent nausea and vomiting after your treatment, we encourage you to take your nausea medication as directed.    If you develop nausea and vomiting that is not controlled by your nausea medication, call the clinic.   BELOW ARE SYMPTOMS THAT SHOULD BE REPORTED IMMEDIATELY:  *FEVER GREATER THAN 100.5 F  *CHILLS WITH OR WITHOUT FEVER  NAUSEA AND VOMITING THAT IS NOT CONTROLLED WITH YOUR NAUSEA MEDICATION  *UNUSUAL SHORTNESS OF BREATH  *UNUSUAL BRUISING OR BLEEDING  TENDERNESS IN MOUTH AND THROAT WITH OR WITHOUT PRESENCE OF ULCERS  *URINARY PROBLEMS  *BOWEL PROBLEMS  UNUSUAL RASH Items with * indicate a potential emergency and should be followed up as soon as possible.  Feel free to call the clinic should you have any questions or concerns. The clinic phone number is [phone].  Please show the [place] at check-in to the Emergency Department and triage nurse.

## 2020-05-15 ENCOUNTER — Encounter: Payer: Self-pay | Admitting: Urology

## 2020-05-16 ENCOUNTER — Other Ambulatory Visit: Payer: Medicare Other

## 2020-05-16 ENCOUNTER — Ambulatory Visit: Payer: Self-pay | Admitting: Urology

## 2020-05-20 ENCOUNTER — Encounter: Payer: Self-pay | Admitting: Medical

## 2020-05-20 ENCOUNTER — Inpatient Hospital Stay: Payer: Medicare Other

## 2020-05-21 ENCOUNTER — Other Ambulatory Visit: Payer: Self-pay | Admitting: *Deleted

## 2020-05-21 MED ORDER — PROCHLORPERAZINE MALEATE 10 MG PO TABS: 10.0000 mg | ORAL_TABLET | Freq: Four times a day (QID) | ORAL | 1 refills | Status: AC | PRN

## 2020-05-23 ENCOUNTER — Ambulatory Visit: Payer: Self-pay | Admitting: Urology

## 2020-05-30 ENCOUNTER — Other Ambulatory Visit: Payer: Self-pay | Admitting: Medical

## 2020-05-30 DIAGNOSIS — J189 Pneumonia, unspecified organism: Secondary | ICD-10-CM

## 2020-05-30 DIAGNOSIS — R509 Fever, unspecified: Secondary | ICD-10-CM

## 2020-05-30 DIAGNOSIS — R059 Cough, unspecified: Secondary | ICD-10-CM

## 2020-05-30 NOTE — Telephone Encounter (Signed)
Refill request

## 2020-05-31 NOTE — Progress Notes (Signed)
[facility] OFFICE PROGRESS NOTE  Laurey Morale, MD [place] [address]  DIAGNOSIS:Stage IV (T2b, N2, M1c)presented with right middle lobe lung mass in addition to mediastinal lymphadenopathy as well as malignant right pleural effusion with pleural-based nodules and suspicious right hepatic lesionand the brain metastasisdiagnosed in July 2020.  Molecular Biomarkers: [id number] (Exon 20 insertion) 0.2% Dacomitinib,Neratinib,Osimertinib  [id number] 0.1% None  PRIOR THERAPY: 1)SRS treatment to the metastatic disease to the brain under the care of Dr. Tammi Klippel. Completed on 04/07/2019. 2) SRS to the suspicious small brain lesion in the thalamocapsular region 01/23/2020 under the care of Dr. Tammi Klippel  CURRENT THERAPY: Systemic chemotherapy with carboplatin for AUC of 5, Alimta 500 mg/M2 and Keytruda 200 mg IV every 3 weeks. First dose April 04, 2019.Status post20cycles.Starting from cycle #5 he has been onmaintenanceAlimta and Keytruda every 3 weeks.  INTERVAL HISTORY: Ethan Brown 69 y.o. male returns to clinic today for a follow-up visit. In the interval since his last appointment, the patient had 1 to 2 days of diarrhea which he believes he caught from his granddaughter who had similar symptoms.  He believes his granddaughter brought at home from daycare.  He had some associated weight loss which caused him to feel weak, off balance for a few days, and fatigue. He also had more nausea which contributed to his weight loss.  He is inquiring about how to better manage his nausea. He denies any vomiting. Of note, the patient needs to reschedule his follow-up brain MRI but he had to cancel his MRI due to his coughing.   The patient states that he gets intermittent low fevers.  He estimates his last fever was approximately 10 days ago.  Denies any night sweats.  He denies any chest pain or hemoptysis.  The patient has an intermittent  cough which is exacerbated by talking or positional changes. He states that once he clears mucus, his cough improves. Compared to the cough that the patient had over the summer when he had pneumonitis, the cough is improved.  He has a prescription for Tussionex as well as guaifenesin/codeine.  Patient is scheduled to have a restaging CT scan performed later today.  The patient is here today for evaluation before starting cycle #21.     MEDICAL HISTORY: Past Medical History:  Diagnosis Date  . Borderline hypertension   . Brain metastasis ([place]) dx'd 01/2019  . Detached vitreous humor, left   . Dyspnea   . ED (erectile dysfunction) of organic origin   . Hyperlipidemia   . Hypertension   . Lens subluxation, left   . Lung cancer ([place]) dx'd 01/2019   Stage IV  . Migraine headache    takes PRN Imitrex  . Pseudophakia   . TGA (transient global amnesia) 2017    ALLERGIES:  has No Known Allergies.  MEDICATIONS:  Current Outpatient Medications  Medication Sig Dispense Refill  . chlorpheniramine-HYDROcodone (TUSSIONEX) 10-8 MG/5ML SUER Take 5 mLs by mouth at bedtime as needed for cough. 140 mL 0  . cyanocobalamin (,VITAMIN B-12,) 1000 MCG/ML injection INJECT 1 ML (1,000 MCG TOTAL) INTO THE MUSCLE ONCE FOR 1 DOSE. 5 mL 1  . folic acid (FOLVITE) 1 MG tablet TAKE 1 TABLET BY MOUTH EVERY DAY 90 tablet 1  . guaiFENesin-codeine 100-10 MG/5ML syrup Take 10 mLs by mouth every 4 (four) hours as needed for cough. 240 mL 0  . hydrochlorothiazide (MICROZIDE) 12.5 MG capsule Take 1 capsule (12.5 mg total) by mouth  2 (two) times daily. 180 capsule 2  . lidocaine-prilocaine (EMLA) cream Apply to the Port-A-Cath site 30-60 minutes before treatment 30 g 0  . LORazepam (ATIVAN) 2 MG tablet Take 0.5 tablets (1 mg total) by mouth at bedtime as needed for sleep. 20 tablet 0  . methylPREDNISolone (MEDROL DOSEPAK) 4 MG TBPK tablet Use as instructed. 21 tablet 0  . Multiple Vitamin (MULTIVITAMIN WITH MINERALS) TABS  tablet Take 1 tablet by mouth daily.    . predniSONE (DELTASONE) 10 MG tablet PLEASE TAKE 9 TABLETS (90 MG) BY MOUTH DAILY FOR 10 DAYS, FOLLOWED BY 7 TABLETS (70 MG) DAILY FOR 7 DAYS, FOLLOWED BY 5 TABLETS (50 MG) DAILY FOR 7 DAYS, FOLLOWED BY 3 TABLETS (30 MG) DAILY FOR 7 DAYS, FOLLOWED BY 1 TABLET (10 MG) DAILY FOR 7 DAYS, FOLLOWED BY 1/2 TABLETS FOR 6 DAYS. 205 tablet 0  . prochlorperazine (COMPAZINE) 10 MG tablet Take 1 tablet (10 mg total) by mouth every 6 (six) hours as needed for nausea or vomiting. 30 tablet 1  . sildenafil (REVATIO) 20 MG tablet TAKE AS DIRECTED 10 tablet 11  . tamsulosin (FLOMAX) 0.4 MG CAPS capsule Take 0.4 mg by mouth daily.     No current facility-administered medications for this visit.    SURGICAL HISTORY:  Past Surgical History:  Procedure Laterality Date  . CAROTID DOPPLERS  09/2017   Mild non-obstructive disease  . CATARACT EXTRACTION, BILATERAL    . cataract, left  2018  . CHEST TUBE INSERTION Right 03/29/2019   Procedure: INSERTION PLEURAL DRAINAGE CATHETER;  Surgeon: [name], MD;  Location: [facility];  Service: Thoracic;  Laterality: Right;  . COLONOSCOPY  2016   clear, repeat in 10 yrs   . Eye surgeries     , Lens attachment.  Vitrectomy  . FEMORAL HERNIA REPAIR    . HERNIA REPAIR    . IR THORACENTESIS ASP PLEURAL SPACE W/IMG GUIDE  02/22/2019  . IR THORACENTESIS ASP PLEURAL SPACE W/IMG GUIDE  03/13/2019  . IR THORACENTESIS ASP PLEURAL SPACE W/IMG GUIDE  03/18/2020  . LUMBAR LAMINECTOMY    . PLEURAL BIOPSY Right 03/29/2019   Procedure: PLEURAL BIOPSY;  Surgeon: [name], MD;  Location: [facility];  Service: Thoracic;  Laterality: Right;  . PORTACATH PLACEMENT N/A 03/29/2019   Procedure: INSERTION PORT-A-CATH;  Surgeon: [name], MD;  Location: [facility];  Service: Thoracic;  Laterality: N/A;  . retinal attachment, right    . sclearl buckle, right    . victrectomy,right    . VIDEO ASSISTED THORACOSCOPY Right 03/29/2019    Procedure: VIDEO ASSISTED THORACOSCOPY;  Surgeon: [name], MD;  Location: [facility] OR;  Service: Thoracic;  Laterality: Right;    REVIEW OF SYSTEMS:   Review of Systems  Constitutional: Positive for fatigue and appetite change secondary to nausea. Negative for chills, fever and unexpected weight change.  HENT: Negative for mouth sores, nosebleeds, sore throat and trouble swallowing.   Eyes: Negative for eye problems and icterus.  Respiratory: Positive for cough/shortness of breath. Negative for hemoptysis and wheezing.  Cardiovascular: Negative for chest pain and leg swelling.  Gastrointestinal: Positive for nausea. Negative for abdominal pain, constipation, diarrhea,  and vomiting.  Genitourinary: Negative for bladder incontinence, difficulty urinating, dysuria, frequency and hematuria.   Musculoskeletal: Negative for back pain, gait problem, neck pain and neck stiffness.  Skin: Negative for itching and rash.  Neurological: Positive for dizziness/off balanced (improved at this time). Negative for  extremity weakness, gait problem,  headaches, light-headedness and seizures.  Hematological: Negative for adenopathy. Does not bruise/bleed easily.  Psychiatric/Behavioral: Negative for confusion, depression and sleep disturbance. The patient is not nervous/anxious.     PHYSICAL EXAMINATION:  There were no vitals taken for this visit.  ECOG PERFORMANCE STATUS: 1 - Symptomatic but completely ambulatory  Physical Exam  Constitutional: Oriented to person, place, and time and well-developed, well-nourished, and in no distress.  HENT:  Head: Normocephalic and atraumatic.  Mouth/Throat: Oropharynx is clear and moist. No oropharyngeal exudate.  Eyes: Conjunctivae are normal. Right eye exhibits no discharge. Left eye exhibits no discharge. No scleral icterus.  Neck: Normal range of motion. Neck supple.  Cardiovascular: Normal rate, regular rhythm, normal heart sounds and intact distal pulses.    Pulmonary/Chest: Effort normal. Decreased breath sounds in the right lower lung field. No respiratory distress. No wheezes. No rales.  Abdominal: Soft. Bowel sounds are normal. Exhibits no distension and no mass. There is no tenderness.  Musculoskeletal: Normal range of motion. Exhibits no edema.  Lymphadenopathy:    No cervical adenopathy.  Neurological: Alert and oriented to person, place, and time. Exhibits normal muscle tone. Gait normal. Coordination normal.  Skin: Skin is warm and dry. No rash noted. Not diaphoretic. No erythema. No pallor.  Psychiatric: Mood, memory and judgment normal.  Vitals reviewed.  LABORATORY DATA: Lab Results  Component Value Date   WBC 6.4 05/14/2020   HGB 12.3 (L) 05/14/2020   HCT 37.3 (L) 05/14/2020   MCV 94.9 05/14/2020   PLT 235 05/14/2020      Chemistry      Component Value Date/Time   NA 137 05/14/2020 0954   K 3.5 05/14/2020 0954   CL 103 05/14/2020 0954   CO2 26 05/14/2020 0954   BUN 25 (H) 05/14/2020 0954   CREATININE 0.93 05/14/2020 0954      Component Value Date/Time   CALCIUM 8.6 (L) 05/14/2020 0954   ALKPHOS 56 05/14/2020 0954   AST 29 05/14/2020 0954   ALT 25 05/14/2020 0954   BILITOT 0.4 05/14/2020 0954       RADIOGRAPHIC STUDIES:  No results found.   ASSESSMENT/PLAN:  This isa very pleasant88 year old Caucasian male who was diagnosed with stage IV non-small cell lung cancer, adenocarcinoma with a positive EGFR resistant mutation in exon 20.He may be eligible for targeted therapy in the future if he progresses on his current treatment.He presented with extensive disease involving the right upper lobe, right middle lobe, and right lower lobe with extensive pleural-based metastases. He also has mediastinal and hilar adenopathy and a rightmalignant pleural effusion. He also has metastatic disease to the brain and a suspicious liver lesion.  He completed SRS treatment to the metastatic disease to the brain under  care of Dr. Tammi Klippel.  He had a pleurex catheter placed under the care of Dr. Roxan Hockey for his malignant pleural effusion.This was removed 07/11/2019  He recently had [place] treatment to the another suspicious brain lesion on 01/23/20 under the care of Dr. Tammi Klippel.  He is currently undergoing systemic chemotherapy with carboplatin AUC of 5, Alimta 500 mg/m, and Keytruda 200 mg IV every 3 weeks. He is status post20cycles. Starting from cycle #5, he has been on maintenance Alimta 500 mg/m2 and Keytruda 200 mg IV every 3 weeks. Beryle Flock was discontinued due to pneumonitis starting from cycle #17.  The patient was seen with Dr. Julien Nordmann.  Labs were reviewed.  We recommend that he proceed with cycle #21 today as scheduled.  The patient is scheduled  to have a restaging CT scan of the chest, abdomen, and pelvis later today.  I will personally call the patient with the results and to discuss if there is any other etiologies of his cough that can be managed.  In the meantime he will consider taking Tussionex as well as codeine cough syrup.  We will see him back for follow-up visit in 3 weeks for evaluation before starting cycle #22.  The patient was encouraged to reschedule his brain MRI if/when able to tolerate.  I have sent in a prescription for Zofran to take if needed for nausea every 8 hours.  He was encouraged to alternate between this and Compazine.  The patient was advised to call immediately if he has any concerning symptoms in the interval. The patient voices understanding of current disease status and treatment options and is in agreement with the current care plan. All questions were answered. The patient knows to call the clinic with any problems, questions or concerns. We can certainly see the patient much sooner if necessary  No orders of the defined types were placed in this encounter.    Murdis Flitton L Alexandre Lightsey, PA-C 05/31/20  ADDENDUM:  Hematology/oncology Attending: I  had a face-to-face encounter with the patient today.  I recommended his care plan.  This is a very pleasant 69 years old white male with stage IV non-small cell lung cancer, adenocarcinoma with positive EGFR mutation but in exon 20.  The patient is started on induction systemic chemotherapy with carboplatin, Alimta and Keytruda for 4 cycles with partial response.  He continues with maintenance treatment with Alimta and Keytruda until cycle #17 when Beryle Flock was discontinued secondary to recurrent immunotherapy mediated pneumonitis. For the last 3 cycles he is on single agent Alimta every 3 weeks and has been tolerating the treatment well. He continues to have dry cough and he is currently using Tussionex on as-needed basis. He is scheduled to have repeat CT scan of the chest, abdomen pelvis later today. I recommended for the patient to proceed with his treatment today as planned. We will check his CT scan when it becomes available and we will discuss the results with the patient. If no evidence for disease progression, he will continue with the same regimen. If the patient has evidence for disease progression on the upcoming scan, we may consider him for the new treatment for EGFR exon 20 mutation with Amivantamab. The patient will come back for follow-up visit in 3 weeks for evaluation before the next cycle of his treatment. He was advised to call immediately if he has any concerning symptoms in the interval.  Disclaimer: This note was dictated with voice recognition software. Similar sounding words can inadvertently be transcribed and may be missed upon review. Eilleen Kempf, MD 06/03/20

## 2020-06-03 ENCOUNTER — Inpatient Hospital Stay: Payer: Medicare Other

## 2020-06-03 ENCOUNTER — Other Ambulatory Visit: Payer: Self-pay

## 2020-06-03 ENCOUNTER — Inpatient Hospital Stay (HOSPITAL_BASED_OUTPATIENT_CLINIC_OR_DEPARTMENT_OTHER): Payer: Medicare Other | Admitting: Physician Assistant

## 2020-06-03 ENCOUNTER — Inpatient Hospital Stay: Payer: Medicare Other | Attending: Internal Medicine

## 2020-06-03 ENCOUNTER — Ambulatory Visit (HOSPITAL_COMMUNITY)
Admission: RE | Admit: 2020-06-03 | Discharge: 2020-06-03 | Disposition: A | Discharge status: Home / Self Care | Payer: Medicare Other | Source: Ambulatory Visit | Attending: Internal Medicine | Admitting: Internal Medicine

## 2020-06-03 VITALS — BP 128/79 | HR 87 | Temp 98.7°F | Resp 20 | Ht 71.0 in | Wt 190.1 lb

## 2020-06-03 DIAGNOSIS — E079 Disorder of thyroid, unspecified: Secondary | ICD-10-CM

## 2020-06-03 DIAGNOSIS — R053 Chronic cough: Secondary | ICD-10-CM

## 2020-06-03 DIAGNOSIS — Z5111 Encounter for antineoplastic chemotherapy: Secondary | ICD-10-CM

## 2020-06-03 DIAGNOSIS — Z79899 Other long term (current) drug therapy: Secondary | ICD-10-CM | POA: Diagnosis not present

## 2020-06-03 DIAGNOSIS — J910 Malignant pleural effusion: Secondary | ICD-10-CM | POA: Insufficient documentation

## 2020-06-03 DIAGNOSIS — C3491 Malignant neoplasm of unspecified part of right bronchus or lung: Secondary | ICD-10-CM | POA: Diagnosis not present

## 2020-06-03 DIAGNOSIS — C787 Secondary malignant neoplasm of liver and intrahepatic bile duct: Secondary | ICD-10-CM | POA: Diagnosis not present

## 2020-06-03 DIAGNOSIS — I700 Atherosclerosis of aorta: Secondary | ICD-10-CM | POA: Diagnosis not present

## 2020-06-03 DIAGNOSIS — C3411 Malignant neoplasm of upper lobe, right bronchus or lung: Secondary | ICD-10-CM | POA: Diagnosis not present

## 2020-06-03 DIAGNOSIS — C3490 Malignant neoplasm of unspecified part of unspecified bronchus or lung: Secondary | ICD-10-CM | POA: Insufficient documentation

## 2020-06-03 DIAGNOSIS — Z5112 Encounter for antineoplastic immunotherapy: Secondary | ICD-10-CM | POA: Insufficient documentation

## 2020-06-03 DIAGNOSIS — Z0389 Encounter for observation for other suspected diseases and conditions ruled out: Secondary | ICD-10-CM | POA: Diagnosis not present

## 2020-06-03 DIAGNOSIS — C7931 Secondary malignant neoplasm of brain: Secondary | ICD-10-CM | POA: Insufficient documentation

## 2020-06-03 DIAGNOSIS — R059 Cough, unspecified: Secondary | ICD-10-CM | POA: Diagnosis not present

## 2020-06-03 LAB — CMP (CANCER CENTER ONLY)
ALT: 24 U/L (ref 0–44)
AST: 24 U/L (ref 15–41)
Albumin: 2 g/dL — ABNORMAL LOW (ref 3.5–5.0)
Alkaline Phosphatase: 76 U/L (ref 38–126)
Anion gap: 7 (ref 5–15)
BUN: 15 mg/dL (ref 8–23)
CO2: 27 mmol/L (ref 22–32)
Calcium: 9 mg/dL (ref 8.9–10.3)
Chloride: 104 mmol/L (ref 98–111)
Creatinine: 0.8 mg/dL (ref 0.61–1.24)
GFR, Estimated: >60 mL/min (ref >60)
Glucose, Bld: 97 mg/dL (ref 70–99)
Potassium: 3.6 mmol/L (ref 3.5–5.1)
Sodium: 138 mmol/L (ref 135–145)
Total Bilirubin: <0.2 mg/dL — ABNORMAL LOW (ref 0.3–1.2)
Total Protein: 5.8 g/dL — ABNORMAL LOW (ref 6.5–8.1)

## 2020-06-03 LAB — CBC WITH DIFFERENTIAL (CANCER CENTER ONLY)
Abs Immature Granulocytes: 0.05 10*3/uL (ref 0.00–0.07)
Basophils Absolute: 0 10*3/uL (ref 0.0–0.1)
Basophils Relative: 0 %
Eosinophils Absolute: 0 10*3/uL (ref 0.0–0.5)
Eosinophils Relative: 0 %
HCT: 29.7 % — ABNORMAL LOW (ref 39.0–52.0)
Hemoglobin: 9.8 g/dL — ABNORMAL LOW (ref 13.0–17.0)
Immature Granulocytes: 1 %
Lymphocytes Relative: 16 %
Lymphs Abs: 0.8 10*3/uL (ref 0.7–4.0)
MCH: 32 pg (ref 26.0–34.0)
MCHC: 33 g/dL (ref 30.0–36.0)
MCV: 97.1 fL (ref 80.0–100.0)
Monocytes Absolute: 0.6 10*3/uL (ref 0.1–1.0)
Monocytes Relative: 12 %
Neutro Abs: 3.4 10*3/uL (ref 1.7–7.7)
Neutrophils Relative %: 71 %
Platelet Count: 368 10*3/uL (ref 150–400)
RBC: 3.06 MIL/uL — ABNORMAL LOW (ref 4.22–5.81)
RDW: 16 % — ABNORMAL HIGH (ref 11.5–15.5)
WBC Count: 4.9 10*3/uL (ref 4.0–10.5)
nRBC: 0 % (ref 0.0–0.2)

## 2020-06-03 LAB — TSH: TSH: 1.099 u[IU]/mL (ref 0.320–4.118)

## 2020-06-03 MED ORDER — HEPARIN SOD (PORK) LOCK FLUSH 100 UNIT/ML IV SOLN
500.0000 [IU] | Freq: Once | INTRAVENOUS | Status: AC | PRN
  Administered 2020-06-03: 500 [IU]
  Filled 2020-06-03: qty 5

## 2020-06-03 MED ORDER — SODIUM CHLORIDE 0.9 % IV SOLN
Freq: Once | INTRAVENOUS | Status: AC
  Filled 2020-06-03: qty 250

## 2020-06-03 MED ORDER — ONDANSETRON HCL 8 MG PO TABS: 8.0000 mg | ORAL_TABLET | Freq: Three times a day (TID) | ORAL | 2 refills | Status: DC | PRN

## 2020-06-03 MED ORDER — SODIUM CHLORIDE 0.9 % IV SOLN
475.0000 mg/m2 | Freq: Once | INTRAVENOUS | Status: AC
  Administered 2020-06-03: 1000 mg via INTRAVENOUS
  Filled 2020-06-03: qty 40

## 2020-06-03 MED ORDER — PROCHLORPERAZINE MALEATE 10 MG PO TABS
10.0000 mg | ORAL_TABLET | Freq: Once | ORAL | Status: AC
  Administered 2020-06-03: 10 mg via ORAL

## 2020-06-03 MED ORDER — SODIUM CHLORIDE 0.9% FLUSH
10.0000 mL | INTRAVENOUS | Status: DC | PRN
  Administered 2020-06-03: 10 mL
  Filled 2020-06-03: qty 10

## 2020-06-03 MED ORDER — IOHEXOL 300 MG/ML  SOLN
100.0000 mL | Freq: Once | INTRAMUSCULAR | Status: AC | PRN
  Administered 2020-06-03: 100 mL via INTRAVENOUS

## 2020-06-03 NOTE — Patient Instructions (Signed)
[facility] Discharge Instructions for Patients Receiving Chemotherapy  Today you received the following chemotherapy agents: Alimta  To help prevent nausea and vomiting after your treatment, we encourage you to take your nausea medication  as prescribed.    If you develop nausea and vomiting that is not controlled by your nausea medication, call the clinic.   BELOW ARE SYMPTOMS THAT SHOULD BE REPORTED IMMEDIATELY:  *FEVER GREATER THAN 100.5 F  *CHILLS WITH OR WITHOUT FEVER  NAUSEA AND VOMITING THAT IS NOT CONTROLLED WITH YOUR NAUSEA MEDICATION  *UNUSUAL SHORTNESS OF BREATH  *UNUSUAL BRUISING OR BLEEDING  TENDERNESS IN MOUTH AND THROAT WITH OR WITHOUT PRESENCE OF ULCERS  *URINARY PROBLEMS  *BOWEL PROBLEMS  UNUSUAL RASH Items with * indicate a potential emergency and should be followed up as soon as possible.  Feel free to call the clinic should you have any questions or concerns. The clinic phone number is [phone].  Please show the [facility] at check-in to the Emergency Department and triage nurse.

## 2020-06-05 ENCOUNTER — Other Ambulatory Visit: Payer: Self-pay | Admitting: Physician Assistant

## 2020-06-05 DIAGNOSIS — C3491 Malignant neoplasm of unspecified part of right bronchus or lung: Secondary | ICD-10-CM

## 2020-06-10 ENCOUNTER — Telehealth: Payer: Self-pay | Admitting: Physician Assistant

## 2020-06-10 NOTE — Telephone Encounter (Signed)
Scheduled per los, patient has been called and notified. 

## 2020-06-12 ENCOUNTER — Encounter: Payer: Self-pay | Admitting: Internal Medicine

## 2020-06-13 ENCOUNTER — Telehealth: Payer: Self-pay

## 2020-06-13 ENCOUNTER — Other Ambulatory Visit: Payer: Self-pay

## 2020-06-13 DIAGNOSIS — C61 Malignant neoplasm of prostate: Secondary | ICD-10-CM | POA: Diagnosis not present

## 2020-06-13 DIAGNOSIS — C3490 Malignant neoplasm of unspecified part of unspecified bronchus or lung: Secondary | ICD-10-CM

## 2020-06-13 NOTE — Telephone Encounter (Signed)
Pt called back to confirm her would like to be seen in symptom management.  Discussed with Lucianne Lei and schedule message sent for lab and OV for tomorrow 06/13/20.  I called pt back to acknowledge his message and what action has been taken. Pt expressed understanding of this information.

## 2020-06-13 NOTE — Telephone Encounter (Signed)
-----   Message from [name], RN sent at 06/13/2020 12:14 PM EDT ----- Regarding: concern Ethan Brown Onc Nurse Cc I only want to put on record that I'm having another bout with swollen feet and ankles, very similar to what I experienced last Spring. The Spring episode was identified as edema and treated with hydration and HCTZ. By mid summer the symptoms almost completely disappeared.    But the symptoms are back again: a tight swelling through the feet, ankles, and a few inches of the shins.  It is extremely painful to walk after a couples hours of sleeping, sitting, but this pain subsides after I walk around a while. The swelling is causing a disfiguration of the feet and legs and shows a purple and splotchy appearance. I'm wondering if phlebitis is causing this.   I am treating the symptoms with compression socks, heat, HCTZ, elevation, and hydration. I'm expecting symptoms will subside, as they did back in the summer.    If there is anything else I can do to expedite improvement, please let me know.  I am spending Saturday through Monday in the [facility].

## 2020-06-14 ENCOUNTER — Other Ambulatory Visit: Payer: Self-pay

## 2020-06-14 ENCOUNTER — Inpatient Hospital Stay (HOSPITAL_BASED_OUTPATIENT_CLINIC_OR_DEPARTMENT_OTHER): Payer: Medicare Other | Admitting: Medical

## 2020-06-14 ENCOUNTER — Inpatient Hospital Stay: Payer: Medicare Other

## 2020-06-14 VITALS — BP 127/82 | HR 92 | Temp 98.6°F | Resp 16 | Ht 71.0 in | Wt 194.0 lb

## 2020-06-14 DIAGNOSIS — L03115 Cellulitis of right lower limb: Secondary | ICD-10-CM | POA: Diagnosis not present

## 2020-06-14 DIAGNOSIS — R600 Localized edema: Secondary | ICD-10-CM

## 2020-06-14 DIAGNOSIS — Z79899 Other long term (current) drug therapy: Secondary | ICD-10-CM | POA: Diagnosis not present

## 2020-06-14 DIAGNOSIS — C3411 Malignant neoplasm of upper lobe, right bronchus or lung: Secondary | ICD-10-CM | POA: Diagnosis not present

## 2020-06-14 DIAGNOSIS — C3490 Malignant neoplasm of unspecified part of unspecified bronchus or lung: Secondary | ICD-10-CM

## 2020-06-14 DIAGNOSIS — C7931 Secondary malignant neoplasm of brain: Secondary | ICD-10-CM

## 2020-06-14 DIAGNOSIS — J90 Pleural effusion, not elsewhere classified: Secondary | ICD-10-CM | POA: Diagnosis not present

## 2020-06-14 DIAGNOSIS — Z5112 Encounter for antineoplastic immunotherapy: Secondary | ICD-10-CM | POA: Diagnosis not present

## 2020-06-14 DIAGNOSIS — C3491 Malignant neoplasm of unspecified part of right bronchus or lung: Secondary | ICD-10-CM | POA: Diagnosis not present

## 2020-06-14 DIAGNOSIS — C787 Secondary malignant neoplasm of liver and intrahepatic bile duct: Secondary | ICD-10-CM | POA: Diagnosis not present

## 2020-06-14 DIAGNOSIS — J910 Malignant pleural effusion: Secondary | ICD-10-CM | POA: Diagnosis not present

## 2020-06-14 LAB — CMP (CANCER CENTER ONLY)
ALT: 24 U/L (ref 0–44)
AST: 30 U/L (ref 15–41)
Albumin: 2.1 g/dL — ABNORMAL LOW (ref 3.5–5.0)
Alkaline Phosphatase: 78 U/L (ref 38–126)
Anion gap: 7 (ref 5–15)
BUN: 12 mg/dL (ref 8–23)
CO2: 31 mmol/L (ref 22–32)
Calcium: 9.1 mg/dL (ref 8.9–10.3)
Chloride: 99 mmol/L (ref 98–111)
Creatinine: 0.89 mg/dL (ref 0.61–1.24)
GFR, Estimated: >60 mL/min (ref >60)
Glucose, Bld: 177 mg/dL — ABNORMAL HIGH (ref 70–99)
Potassium: 4.5 mmol/L (ref 3.5–5.1)
Sodium: 137 mmol/L (ref 135–145)
Total Bilirubin: <0.2 mg/dL — ABNORMAL LOW (ref 0.3–1.2)
Total Protein: 5.9 g/dL — ABNORMAL LOW (ref 6.5–8.1)

## 2020-06-14 LAB — CBC WITH DIFFERENTIAL (CANCER CENTER ONLY)
Abs Immature Granulocytes: 0.12 10*3/uL — ABNORMAL HIGH (ref 0.00–0.07)
Basophils Absolute: 0 10*3/uL (ref 0.0–0.1)
Basophils Relative: 1 %
Eosinophils Absolute: 0 10*3/uL (ref 0.0–0.5)
Eosinophils Relative: 0 %
HCT: 28.1 % — ABNORMAL LOW (ref 39.0–52.0)
Hemoglobin: 9.1 g/dL — ABNORMAL LOW (ref 13.0–17.0)
Immature Granulocytes: 5 %
Lymphocytes Relative: 40 %
Lymphs Abs: 1 10*3/uL (ref 0.7–4.0)
MCH: 31.2 pg (ref 26.0–34.0)
MCHC: 32.4 g/dL (ref 30.0–36.0)
MCV: 96.2 fL (ref 80.0–100.0)
Monocytes Absolute: 0.8 10*3/uL (ref 0.1–1.0)
Monocytes Relative: 31 %
Neutro Abs: 0.6 10*3/uL — ABNORMAL LOW (ref 1.7–7.7)
Neutrophils Relative %: 23 %
Platelet Count: 206 10*3/uL (ref 150–400)
RBC: 2.92 MIL/uL — ABNORMAL LOW (ref 4.22–5.81)
RDW: 15.6 % — ABNORMAL HIGH (ref 11.5–15.5)
WBC Count: 2.5 10*3/uL — ABNORMAL LOW (ref 4.0–10.5)
nRBC: 0 % (ref 0.0–0.2)

## 2020-06-14 MED ORDER — FUROSEMIDE 20 MG PO TABS: ORAL_TABLET | ORAL | 1 refills | Status: DC

## 2020-06-14 MED ORDER — SULFAMETHOXAZOLE-TRIMETHOPRIM 800-160 MG PO TABS: 1.0000 | ORAL_TABLET | Freq: Two times a day (BID) | ORAL | 0 refills | Status: DC

## 2020-06-16 ENCOUNTER — Encounter: Payer: Self-pay | Admitting: Medical

## 2020-06-18 ENCOUNTER — Other Ambulatory Visit: Payer: Self-pay

## 2020-06-18 ENCOUNTER — Telehealth: Payer: Self-pay | Admitting: *Deleted

## 2020-06-18 ENCOUNTER — Ambulatory Visit (HOSPITAL_COMMUNITY)
Admission: RE | Admit: 2020-06-18 | Discharge: 2020-06-18 | Disposition: A | Discharge status: Home / Self Care | Payer: Medicare Other | Source: Ambulatory Visit | Attending: Physician Assistant | Admitting: Physician Assistant

## 2020-06-18 DIAGNOSIS — J90 Pleural effusion, not elsewhere classified: Secondary | ICD-10-CM | POA: Diagnosis not present

## 2020-06-18 DIAGNOSIS — N400 Enlarged prostate without lower urinary tract symptoms: Secondary | ICD-10-CM | POA: Insufficient documentation

## 2020-06-18 DIAGNOSIS — C772 Secondary and unspecified malignant neoplasm of intra-abdominal lymph nodes: Secondary | ICD-10-CM | POA: Diagnosis not present

## 2020-06-18 DIAGNOSIS — C3490 Malignant neoplasm of unspecified part of unspecified bronchus or lung: Secondary | ICD-10-CM | POA: Diagnosis not present

## 2020-06-18 DIAGNOSIS — C3491 Malignant neoplasm of unspecified part of right bronchus or lung: Secondary | ICD-10-CM | POA: Diagnosis not present

## 2020-06-18 DIAGNOSIS — C771 Secondary and unspecified malignant neoplasm of intrathoracic lymph nodes: Secondary | ICD-10-CM | POA: Insufficient documentation

## 2020-06-18 DIAGNOSIS — C7951 Secondary malignant neoplasm of bone: Secondary | ICD-10-CM | POA: Diagnosis not present

## 2020-06-18 LAB — GLUCOSE, CAPILLARY: Glucose-Capillary: 94 mg/dL (ref 70–99)

## 2020-06-18 MED ORDER — FLUDEOXYGLUCOSE F - 18 (FDG) INJECTION
9.5000 | Freq: Once | INTRAVENOUS | Status: AC | PRN
  Administered 2020-06-18: 9.5 via INTRAVENOUS

## 2020-06-19 ENCOUNTER — Telehealth: Payer: Self-pay | Admitting: Emergency Medicine

## 2020-06-19 DIAGNOSIS — R600 Localized edema: Secondary | ICD-10-CM

## 2020-06-19 NOTE — Telephone Encounter (Signed)
Called pt regarding MyChart message requesting if he should start back on his Lasix at home and if so what dose.  Left VM (ok per ROI) with instructions to start back Lasix at 20 mg dose and that PA Lucianne Lei would like for pt to be seen by Nutrition to assist with low protein/albumin levels which are contributing to his edema.  Order for nutrition referral placed and call back info left with message.

## 2020-06-19 NOTE — Progress Notes (Signed)
Symptoms Management Clinic Progress Note   Ethan Brown [id number] [birth year] 69 y.o.  Ethan Brown is managed by Dr. Fanny Bien. Ethan Brown  Actively treated with chemotherapy/immunotherapy/hormonal therapy: yes  Current therapy: Alimta  Last treated: 06/03/2020 (cycle #21)  Next scheduled appointment with provider: 06/25/2020  Assessment: Plan:    Localized edema - Plan: furosemide (LASIX) 20 MG tablet, Ambulatory referral to Physical Therapy  Cellulitis of right lower extremity - Plan: sulfamethoxazole-trimethoprim (BACTRIM DS) 800-160 MG tablet  Adenocarcinoma of right lung, stage 4 (HCC)  Brain metastasis (HCC)  Recurrent right pleural effusion   Lower extremity edema: The patient was given a prescription for Lasix 20 mg once daily with instructions to use for 3 days only and then stop.  He was told that he could repeat this as needed for recurrent lymphedema.  Additionally a referral has been made to physical therapy to the lymphedema clinic. He was told to watch his sodium intake, increase his protein intake and begin using compression stockings.  Cellulitis of the right lower extremity: The patient was given a prescription for Bactrim DS p.o. twice daily x7 days.  Metastatic adenocarcinoma of the right lung with brain metastasis and a recurrent right pleural effusion: Ethan Brown continues to be managed by Dr. Julien Nordmann and is status post cycle #21 of Alimta which was dosed on 06/03/2020.  He is scheduled to be seen next on 06/25/2020.  Please see After Visit Summary for patient specific instructions.  Future Appointments  Date Time Provider [place]  06/20/2020  1:00 PM GI-315 MR 3 GI-315MRI GI-315 [address] [place]  06/24/2020  7:00 AM [facility]TUMOR BOARD CONFERENCE [facility] None  06/25/2020  9:30 AM [facility] LAB [facility] None  06/25/2020  9:45 AM [facility] [place] FLUSH [facility] None  06/25/2020 10:15 AM Curt Bears, MD [facility] None  06/25/2020  11:00 AM [facility] INFUSION [facility] None  06/27/2020  1:30 PM Bruning, Ashlyn, PA-C [facility] None  07/15/2020 11:15 AM [facility] LAB [facility] None  07/15/2020 11:30 AM [facility] [place] FLUSH [facility] None  07/15/2020 12:00 PM Heilingoetter, Cassandra L, PA-C [facility] None  07/15/2020  1:30 PM [facility] INFUSION [facility] None  07/15/2020  2:30 PM Neff, Barbara L, RD [facility] None  08/05/2020 10:30 AM [facility] LAB [facility] None  08/05/2020 10:45 AM [facility] [place] FLUSH [facility] None  08/05/2020 11:15 AM Curt Bears, MD [facility] None  08/05/2020 12:30 PM [facility] INFUSION [facility] None  08/26/2020  8:20 AM Leonie Man, MD [facility]  08/27/2020 10:45 AM [facility] LAB [facility] None  08/27/2020 11:00 AM [facility] [place] FLUSH [facility] None  08/27/2020 11:30 AM Heilingoetter, Cassandra L, PA-C [facility] None  08/27/2020 12:30 PM [facility] INFUSION [facility] None    Orders Placed This Encounter  Procedures  . Ambulatory referral to Physical Therapy       Subjective:   Patient ID:  Ethan Brown is a 69 y.o. (DOB Oct 17, 1950) male.  Chief Complaint: No chief complaint on file.   HPI Ethan Brown is a 69 y.o. male with a diagnosis of a metastatic adenocarcinoma of the right lung with brain metastasis and a recurrent right pleural effusion. He is managed by Dr. Julien Nordmann and is status post cycle #21 of Alimta which was dosed on 06/03/2020.  He presents today with bilateral lower extremity edema. He has erythema and pain over the right lower medial leg. He denies trauma or changes in activity.  He denies fevers, chills, or sweats.  Medications: I have reviewed the patient's current medications.  Allergies: No  Known Allergies  Past Medical History:  Diagnosis Date  . Borderline hypertension   . Brain metastasis ([place]) dx'd 01/2019  . Detached vitreous humor, left   . Dyspnea   . ED (erectile dysfunction) of organic  origin   . Hyperlipidemia   . Hypertension   . Lens subluxation, left   . Lung cancer ([place]) dx'd 01/2019   Stage IV  . Migraine headache    takes PRN Imitrex  . Pseudophakia   . TGA (transient global amnesia) 2017    Past Surgical History:  Procedure Laterality Date  . CAROTID DOPPLERS  09/2017   Mild non-obstructive disease  . CATARACT EXTRACTION, BILATERAL    . cataract, left  2018  . CHEST TUBE INSERTION Right 03/29/2019   Procedure: INSERTION PLEURAL DRAINAGE CATHETER;  Surgeon: [name], MD;  Location: [place];  Service: Thoracic;  Laterality: Right;  . COLONOSCOPY  2016   clear, repeat in 10 yrs   . Eye surgeries     , Lens attachment.  Vitrectomy  . FEMORAL HERNIA REPAIR    . HERNIA REPAIR    . IR THORACENTESIS ASP PLEURAL SPACE W/IMG GUIDE  02/22/2019  . IR THORACENTESIS ASP PLEURAL SPACE W/IMG GUIDE  03/13/2019  . IR THORACENTESIS ASP PLEURAL SPACE W/IMG GUIDE  03/18/2020  . LUMBAR LAMINECTOMY    . PLEURAL BIOPSY Right 03/29/2019   Procedure: PLEURAL BIOPSY;  Surgeon: [name], MD;  Location: [place];  Service: Thoracic;  Laterality: Right;  . PORTACATH PLACEMENT N/A 03/29/2019   Procedure: INSERTION PORT-A-CATH;  Surgeon: [name], MD;  Location: [place];  Service: Thoracic;  Laterality: N/A;  . retinal attachment, right    . sclearl buckle, right    . victrectomy,right    . VIDEO ASSISTED THORACOSCOPY Right 03/29/2019   Procedure: VIDEO ASSISTED THORACOSCOPY;  Surgeon: [name], MD;  Location: [facility];  Service: Thoracic;  Laterality: Right;    Family History  Problem Relation Age of Onset  . Alcohol abuse Mother   . Diabetes Father   . Hypertension Father   . Prostate cancer Father   . Cancer Father   . Heart attack Maternal Grandfather 78    Social History   Socioeconomic History  . Marital status: Married    Spouse name: [name]  . Number of children: 3  . Years of education: Not on file  . Highest education  level: Master's degree (e.g., MA, MS, MEng, MEd, MSW, MBA)  Occupational History  . Occupation: retired Optometrist  Tobacco Use  . Smoking status: Never Smoker  . Smokeless tobacco: Never Used  Vaping Use  . Vaping Use: Never used  Substance and Sexual Activity  . Alcohol use: Yes    Alcohol/week: 1.0 standard drink    Types: 1 Glasses of wine per week  . Drug use: No  . Sexual activity: Not Currently  Other Topics Concern  . Not on file  Social History Narrative   Married father of 39, grandfather 1.  Lives with his wife, [name] they recently moved to [place] after living in [place].      He is a retired Engineer, maintenance (IT)   Was previously on an exercise regimen at [employer] 3 days a week for 90 minutes at a time --> he is now hoping to get back into that plan, but is currently now riding his bike at least 30 to 40 minutes a day for 5 days a week and then doing longer walks  about 6 days a week.   Social Determinants of Health   Financial Resource Strain:   . Difficulty of Paying Living Expenses: Not on file  Food Insecurity:   . Worried About Charity fundraiser in the Last Year: Not on file  . Ran Out of Food in the Last Year: Not on file  Transportation Needs:   . Lack of Transportation (Medical): Not on file  . Lack of Transportation (Non-Medical): Not on file  Physical Activity:   . Days of Exercise per Week: Not on file  . Minutes of Exercise per Session: Not on file  Stress:   . Feeling of Stress : Not on file  Social Connections:   . Frequency of Communication with Friends and Family: Not on file  . Frequency of Social Gatherings with Friends and Family: Not on file  . Attends Religious Services: Not on file  . Active Member of Clubs or Organizations: Not on file  . Attends Archivist Meetings: Not on file  . Marital Status: Not on file  Intimate Partner Violence:   . Fear of Current or Ex-Partner: Not on file  . Emotionally Abused: Not on file  .  Physically Abused: Not on file  . Sexually Abused: Not on file    Past Medical History, Surgical history, Social history, and Family history were reviewed and updated as appropriate.   Please see review of systems for further details on the patient's review from today.   Review of Systems:  Review of Systems  Constitutional: Negative for chills, diaphoresis and fever.  HENT: Negative for trouble swallowing and voice change.   Respiratory: Negative for cough, chest tightness, shortness of breath and wheezing.   Cardiovascular: Positive for leg swelling. Negative for chest pain and palpitations.  Gastrointestinal: Negative for abdominal pain, constipation, diarrhea, nausea and vomiting.  Musculoskeletal: Negative for back pain and myalgias.  Skin: Positive for color change.  Neurological: Negative for dizziness, light-headedness and headaches.    Objective:   Physical Exam:  BP 127/82 (BP Location: Left Arm, Patient Position: Sitting)   Pulse 92   Temp 98.6 F (37 C) (Tympanic)   Resp 16   Ht 5\' 11"  (1.803 m)   Wt 194 lb (88 kg)   SpO2 100%   BMI 27.06 kg/m  ECOG: 1  Physical Exam Constitutional:      General: He is not in acute distress.    Appearance: He is not diaphoretic.  HENT:     Head: Normocephalic and atraumatic.  Eyes:     General: No scleral icterus.       Right eye: No discharge.        Left eye: No discharge.     Conjunctiva/sclera: Conjunctivae normal.  Cardiovascular:     Rate and Rhythm: Normal rate and regular rhythm.     Heart sounds: Normal heart sounds. No murmur heard.  No friction rub. No gallop.   Pulmonary:     Effort: Pulmonary effort is normal. No respiratory distress.     Breath sounds: Normal breath sounds. No wheezing or rales.  Musculoskeletal:        General: Tenderness present.     Right lower leg: Edema present.     Left lower leg: Edema present.     Comments: 1-2+ bilateral lower extremity edema with stasis dermatitis and  erythema noted over the medial surface of the right lower extremity.  Skin:    General: Skin is warm and dry.  Findings: Erythema present. No rash.  Neurological:     Mental Status: He is alert.     Coordination: Coordination normal.     Gait: Gait normal.  Psychiatric:        Mood and Affect: Mood normal.        Behavior: Behavior normal.        Thought Content: Thought content normal.        Judgment: Judgment normal.     Lab Review:     Component Value Date/Time   NA 137 06/14/2020 1409   K 4.5 06/14/2020 1409   CL 99 06/14/2020 1409   CO2 31 06/14/2020 1409   GLUCOSE 177 (H) 06/14/2020 1409   BUN 12 06/14/2020 1409   CREATININE 0.89 06/14/2020 1409   CALCIUM 9.1 06/14/2020 1409   PROT 5.9 (L) 06/14/2020 1409   ALBUMIN 2.1 (L) 06/14/2020 1409   AST 30 06/14/2020 1409   ALT 24 06/14/2020 1409   ALKPHOS 78 06/14/2020 1409   BILITOT <0.2 (L) 06/14/2020 1409   GFRNONAA >60 06/14/2020 1409   GFRAA >60 05/14/2020 0954       Component Value Date/Time   WBC 2.5 (L) 06/14/2020 1409   WBC 7.5 03/28/2019 1134   RBC 2.92 (L) 06/14/2020 1409   HGB 9.1 (L) 06/14/2020 1409   HCT 28.1 (L) 06/14/2020 1409   PLT 206 06/14/2020 1409   MCV 96.2 06/14/2020 1409   MCH 31.2 06/14/2020 1409   MCHC 32.4 06/14/2020 1409   RDW 15.6 (H) 06/14/2020 1409   LYMPHSABS 1.0 06/14/2020 1409   MONOABS 0.8 06/14/2020 1409   EOSABS 0.0 06/14/2020 1409   BASOSABS 0.0 06/14/2020 1409   -------------------------------  Imaging from last 24 hours (if applicable):  Radiology interpretation: CT Chest W Contrast  Result Date: 06/03/2020 CLINICAL DATA:  Non-small cell lung cancer staging, follow-up evaluation. Persistent cough with ongoing systemic therapy EXAM: CT CHEST, ABDOMEN, AND PELVIS WITH CONTRAST TECHNIQUE: Multidetector CT imaging of the chest, abdomen and pelvis was performed following the standard protocol during bolus administration of intravenous contrast. CONTRAST:  161mL  OMNIPAQUE IOHEXOL 300 MG/ML  SOLN COMPARISON:  February 09, 2020 FINDINGS: CT CHEST FINDINGS Cardiovascular: Heart size is normal. Pericardial nodularity similar to the prior exam. Aortic caliber is normal. Central pulmonary vasculature with unremarkable appearance on venous phase assessment. Mediastinum/Nodes: Thoracic inlet structures are normal. RIGHT-sided Port-A-Cath terminates in the mid SVC similar to the prior study. 11 mm subcarinal lymph node previously 10 mm. Subtle nodularity along the RIGHT mediastinal border, for instance on image 30 of series 2 5 mm greatest thickness previously approximately 4 mm, grossly similar. No LEFT hilar adenopathy. Lungs/Pleura: Increasing size of RIGHT-sided pleural fluid, depth of loculated pleural fluid in the RIGHT chest 5.2 cm as compared to approximately 2.3 cm on the prior study. Pleural nodularity in the inferior RIGHT chest (image 48, series 2) 9 mm nodule lateral to the RIGHT heart previously 6 mm. Inferior costodiaphragmatic recess with subtle nodularity anteriorly Loculated fluid extending into the medial RIGHT chest as before. Subtle diaphragmatic nodularity with increased, nodule over the RIGHT hemidiaphragm measuring 12 mm on image 49 of series 2 not seen as a discrete nodule on the prior study. Increasing parenchymal consolidative changes in the dependent RIGHT chest. Diffuse interstitial thickening with signs of central nodularity adjacent to the RIGHT hilum on image 84 of series 7 with 16 x 15 mm size today previously 18 x 11 mm, within 1 mm when measured in a similar fashion  to the prior study but with small nodules just peripheral to this on image 83 of series 7 displaying increase in size with numerous nodules along the minor fissure, largest approximately 8 mm. Multiple small nodules track along the minor fissure in the RIGHT chest these are increasing in conspicuity over time. Increased fissural nodularity in the inferior RIGHT chest on image 107 of series  7 where there is a 1.8 x 0.8 cm area of nodularity that was previously approximately 1 x 0.6 cm. Similar appearance of LEFT-sided pleural fluid airways are patent. Musculoskeletal: No discrete chest wall mass, see below for full musculoskeletal details. CT ABDOMEN PELVIS FINDINGS Hepatobiliary: Liver without focal lesion. No pericholecystic stranding. No biliary duct dilation. Pancreas: Pancreas without ductal dilation or sign of inflammation. Spleen: Spleen normal in size and contour similar to prior study. Adrenals/Urinary Tract: Adrenal glands are normal. Symmetric renal enhancement. No suspicious renal lesion. No hydronephrosis. Urinary bladder unremarkable. Stomach/Bowel: Stomach under distended limiting assessment. No acute gastrointestinal process colonic diverticulosis. Vascular/Lymphatic: Vascular structures in the abdomen are patent. There is no gastrohepatic or hepatoduodenal ligament lymphadenopathy. No retroperitoneal or mesenteric lymphadenopathy. Reproductive: Post vasectomy. The prostatomegaly. No pelvic adenopathy. Other: No ascites. Musculoskeletal: Sclerotic lesion in the RIGHT iliac, areas of subtle sclerosis before now measuring 2.5 x 1.3 cm, clearly a change from prior exams. Subtle sclerotic focus in the L1 vertebral body which is new compared to previous imaging IMPRESSION: 1. Increasing nodularity about the RIGHT hilum and along the fissures, minor fissure and inferior major fissure in the RIGHT chest in particular. Findings suspicious for worsening of disease and associated with involvement along the RIGHT mediastinal border and with lymphangitic carcinomatosis. There is also subtle pericardial nodularity. 2. Increasing size of RIGHT pleural fluid with loculated appearance. No signs of gas. Findings likely related to enlarging malignant effusion given other findings in the chest. Correlate with any signs of infection though this is not favored. 3. New sclerotic focus in L1 and signs of bony  sclerosis in the RIGHT iliac crest compatible with metastatic disease. 4. Given constellation of findings above PET-CT may be helpful for further evaluation as clinically warranted. 5. Similar appearance of LEFT-sided pleural fluid. 6. Aortic atherosclerosis. Aortic Atherosclerosis (ICD10-I70.0). Electronically Signed   By: Zetta Bills M.D.   On: 06/03/2020 18:37   CT Abdomen Pelvis W Contrast  Result Date: 06/03/2020 CLINICAL DATA:  Non-small cell lung cancer staging, follow-up evaluation. Persistent cough with ongoing systemic therapy EXAM: CT CHEST, ABDOMEN, AND PELVIS WITH CONTRAST TECHNIQUE: Multidetector CT imaging of the chest, abdomen and pelvis was performed following the standard protocol during bolus administration of intravenous contrast. CONTRAST:  165mL OMNIPAQUE IOHEXOL 300 MG/ML  SOLN COMPARISON:  February 09, 2020 FINDINGS: CT CHEST FINDINGS Cardiovascular: Heart size is normal. Pericardial nodularity similar to the prior exam. Aortic caliber is normal. Central pulmonary vasculature with unremarkable appearance on venous phase assessment. Mediastinum/Nodes: Thoracic inlet structures are normal. RIGHT-sided Port-A-Cath terminates in the mid SVC similar to the prior study. 11 mm subcarinal lymph node previously 10 mm. Subtle nodularity along the RIGHT mediastinal border, for instance on image 30 of series 2 5 mm greatest thickness previously approximately 4 mm, grossly similar. No LEFT hilar adenopathy. Lungs/Pleura: Increasing size of RIGHT-sided pleural fluid, depth of loculated pleural fluid in the RIGHT chest 5.2 cm as compared to approximately 2.3 cm on the prior study. Pleural nodularity in the inferior RIGHT chest (image 48, series 2) 9 mm nodule lateral to the RIGHT heart previously 6  mm. Inferior costodiaphragmatic recess with subtle nodularity anteriorly Loculated fluid extending into the medial RIGHT chest as before. Subtle diaphragmatic nodularity with increased, nodule over the RIGHT  hemidiaphragm measuring 12 mm on image 49 of series 2 not seen as a discrete nodule on the prior study. Increasing parenchymal consolidative changes in the dependent RIGHT chest. Diffuse interstitial thickening with signs of central nodularity adjacent to the RIGHT hilum on image 84 of series 7 with 16 x 15 mm size today previously 18 x 11 mm, within 1 mm when measured in a similar fashion to the prior study but with small nodules just peripheral to this on image 83 of series 7 displaying increase in size with numerous nodules along the minor fissure, largest approximately 8 mm. Multiple small nodules track along the minor fissure in the RIGHT chest these are increasing in conspicuity over time. Increased fissural nodularity in the inferior RIGHT chest on image 107 of series 7 where there is a 1.8 x 0.8 cm area of nodularity that was previously approximately 1 x 0.6 cm. Similar appearance of LEFT-sided pleural fluid airways are patent. Musculoskeletal: No discrete chest wall mass, see below for full musculoskeletal details. CT ABDOMEN PELVIS FINDINGS Hepatobiliary: Liver without focal lesion. No pericholecystic stranding. No biliary duct dilation. Pancreas: Pancreas without ductal dilation or sign of inflammation. Spleen: Spleen normal in size and contour similar to prior study. Adrenals/Urinary Tract: Adrenal glands are normal. Symmetric renal enhancement. No suspicious renal lesion. No hydronephrosis. Urinary bladder unremarkable. Stomach/Bowel: Stomach under distended limiting assessment. No acute gastrointestinal process colonic diverticulosis. Vascular/Lymphatic: Vascular structures in the abdomen are patent. There is no gastrohepatic or hepatoduodenal ligament lymphadenopathy. No retroperitoneal or mesenteric lymphadenopathy. Reproductive: Post vasectomy. The prostatomegaly. No pelvic adenopathy. Other: No ascites. Musculoskeletal: Sclerotic lesion in the RIGHT iliac, areas of subtle sclerosis before now  measuring 2.5 x 1.3 cm, clearly a change from prior exams. Subtle sclerotic focus in the L1 vertebral body which is new compared to previous imaging IMPRESSION: 1. Increasing nodularity about the RIGHT hilum and along the fissures, minor fissure and inferior major fissure in the RIGHT chest in particular. Findings suspicious for worsening of disease and associated with involvement along the RIGHT mediastinal border and with lymphangitic carcinomatosis. There is also subtle pericardial nodularity. 2. Increasing size of RIGHT pleural fluid with loculated appearance. No signs of gas. Findings likely related to enlarging malignant effusion given other findings in the chest. Correlate with any signs of infection though this is not favored. 3. New sclerotic focus in L1 and signs of bony sclerosis in the RIGHT iliac crest compatible with metastatic disease. 4. Given constellation of findings above PET-CT may be helpful for further evaluation as clinically warranted. 5. Similar appearance of LEFT-sided pleural fluid. 6. Aortic atherosclerosis. Aortic Atherosclerosis (ICD10-I70.0). Electronically Signed   By: Zetta Bills M.D.   On: 06/03/2020 18:37   NM PET Image Restag (PS) Skull Base To Thigh  Result Date: 06/19/2020 CLINICAL DATA:  Subsequent treatment strategy for lung cancer. EXAM: NUCLEAR MEDICINE PET SKULL BASE TO THIGH TECHNIQUE: 9.5 mCi F-18 FDG was injected intravenously. Full-ring PET imaging was performed from the skull base to thigh after the radiotracer. CT data was obtained and used for attenuation correction and anatomic localization. Fasting blood glucose: 95 mg/dl COMPARISON:  CT chest abdomen pelvis 06/03/2020 and PET 03/27/2019. FINDINGS: Mediastinal blood pool activity: SUV max 2.2 Liver activity: SUV max NA NECK: No hypermetabolic lymph nodes. Incidental CT findings: None. CHEST: 5 mm right supraclavicular lymph  node (5/94) is hypermetabolic, SUV max 3.7. 8 mm low right internal jugular lymph  node (4/55) has an SUV max of 5.2. Hypermetabolic 5 mm low left paratracheal lymph node (4/78), SUV max 3.3. Hypermetabolic subcarinal lymph nodes measure up to 8 mm (4/84), SUV max 10.2. Hypermetabolic right hilar lymph nodes have an SUV max of 5.1. No axillary adenopathy. Residual hypermetabolic pleuroparenchymal thickening in the lower right hemithorax (SUV max 9.2, 4/108). Incidental CT findings: Right IJ Port-A-Cath terminates in the low SVC. Heart is at the upper limits of normal in size. No pericardial effusion. Loculated moderate right pleural effusion in the lower right hemithorax with associated pleural thickening and nodularity. Small left pleural effusion, stable. Septal thickening in the lower right hemithorax. ABDOMEN/PELVIS: No abnormal hypermetabolism in the liver, adrenal glands, spleen or pancreas. Small hypermetabolic abdominal retroperitoneal lymph nodes are seen with an index aortocaval lymph node measuring 6 mm (4/153), SUV max 4.0. Incidental CT findings: Liver, gallbladder, adrenal glands, kidneys, spleen, pancreas, stomach and bowel are grossly unremarkable. Haziness and slight nodularity in the small bowel mesentery, unchanged. Slight bladder wall thickening with a left lateral diverticulum. Prostate is enlarged. SKELETON: There are new hypermetabolic sclerotic lesions in posterior right ribs, left scapula L1 and the medial right iliac wing. Index lesion in the right iliac wing measures 1.5 x 2.9 cm (4/169) with an SUV max of 5.3. Incidental CT findings: Degenerative changes in the spine. IMPRESSION: 1. Metastatic disease involving thoracic and abdominal lymph nodes, right hemithorax and bones. 2. Loculated moderate right pleural effusion. Small left pleural effusion. 3. Prostate enlargement. Bladder wall thickening is indicative of an element of outlet obstruction. Electronically Signed   By: Lorin Picket M.D.   On: 06/19/2020 09:20

## 2020-06-20 ENCOUNTER — Other Ambulatory Visit: Payer: Self-pay

## 2020-06-20 ENCOUNTER — Ambulatory Visit
Admission: RE | Admit: 2020-06-20 | Discharge: 2020-06-20 | Disposition: A | Discharge status: Home / Self Care | Payer: Medicare Other | Source: Ambulatory Visit | Attending: Radiation Oncology | Admitting: Radiation Oncology

## 2020-06-20 DIAGNOSIS — H748X2 Other specified disorders of left middle ear and mastoid: Secondary | ICD-10-CM | POA: Diagnosis not present

## 2020-06-20 DIAGNOSIS — C61 Malignant neoplasm of prostate: Secondary | ICD-10-CM | POA: Diagnosis not present

## 2020-06-20 DIAGNOSIS — N401 Enlarged prostate with lower urinary tract symptoms: Secondary | ICD-10-CM | POA: Diagnosis not present

## 2020-06-20 DIAGNOSIS — J3489 Other specified disorders of nose and nasal sinuses: Secondary | ICD-10-CM | POA: Diagnosis not present

## 2020-06-20 DIAGNOSIS — G9389 Other specified disorders of brain: Secondary | ICD-10-CM | POA: Diagnosis not present

## 2020-06-20 DIAGNOSIS — R351 Nocturia: Secondary | ICD-10-CM | POA: Diagnosis not present

## 2020-06-20 DIAGNOSIS — C3490 Malignant neoplasm of unspecified part of unspecified bronchus or lung: Secondary | ICD-10-CM | POA: Diagnosis not present

## 2020-06-20 DIAGNOSIS — C7931 Secondary malignant neoplasm of brain: Secondary | ICD-10-CM

## 2020-06-20 MED ORDER — GADOBENATE DIMEGLUMINE 529 MG/ML IV SOLN
17.0000 mL | Freq: Once | INTRAVENOUS | Status: AC | PRN
  Administered 2020-06-20: 17 mL via INTRAVENOUS

## 2020-06-20 NOTE — Progress Notes (Signed)
Ethan Brown, please set-up [facility]

## 2020-06-24 ENCOUNTER — Inpatient Hospital Stay: Payer: Medicare Other | Attending: Internal Medicine

## 2020-06-24 ENCOUNTER — Telehealth: Payer: Self-pay

## 2020-06-24 ENCOUNTER — Telehealth: Payer: Self-pay | Admitting: Pharmacist

## 2020-06-24 DIAGNOSIS — R590 Localized enlarged lymph nodes: Secondary | ICD-10-CM | POA: Insufficient documentation

## 2020-06-24 DIAGNOSIS — Z923 Personal history of irradiation: Secondary | ICD-10-CM | POA: Insufficient documentation

## 2020-06-24 DIAGNOSIS — C3411 Malignant neoplasm of upper lobe, right bronchus or lung: Secondary | ICD-10-CM | POA: Insufficient documentation

## 2020-06-24 DIAGNOSIS — C7951 Secondary malignant neoplasm of bone: Secondary | ICD-10-CM | POA: Insufficient documentation

## 2020-06-24 DIAGNOSIS — C787 Secondary malignant neoplasm of liver and intrahepatic bile duct: Secondary | ICD-10-CM | POA: Insufficient documentation

## 2020-06-24 DIAGNOSIS — C7931 Secondary malignant neoplasm of brain: Secondary | ICD-10-CM | POA: Insufficient documentation

## 2020-06-24 DIAGNOSIS — Z79899 Other long term (current) drug therapy: Secondary | ICD-10-CM | POA: Insufficient documentation

## 2020-06-24 NOTE — Telephone Encounter (Signed)
Oral Oncology Patient Advocate Encounter  Received notification from [facility] that prior authorization for [employer] is required.  PA submitted on CoverMyMeds Key [id number] Status is pending  Oral Oncology Clinic will continue to follow.  [place] Patient [place] Phone [phone] Fax [phone] 06/24/2020 10:01 AM

## 2020-06-24 NOTE — Telephone Encounter (Signed)
Oral Oncology Patient Advocate Encounter  Prior Authorization for Ethan Brown has been approved.    PA# [id number] Effective dates: 06/24/20 through until further notice  Patients co-pay is [place] [facility] will continue to follow.    [place] Patient [place] Phone [phone] Fax [phone] 06/24/2020 1:10 PM

## 2020-06-25 ENCOUNTER — Ambulatory Visit
Admission: RE | Admit: 2020-06-25 | Discharge: 2020-06-25 | Disposition: A | Discharge status: Home / Self Care | Payer: Medicare Other | Source: Ambulatory Visit | Attending: Radiation Oncology | Admitting: Radiation Oncology

## 2020-06-25 ENCOUNTER — Other Ambulatory Visit: Payer: Self-pay | Admitting: Internal Medicine

## 2020-06-25 ENCOUNTER — Other Ambulatory Visit: Payer: Self-pay | Admitting: Urology

## 2020-06-25 ENCOUNTER — Other Ambulatory Visit: Payer: Self-pay

## 2020-06-25 ENCOUNTER — Inpatient Hospital Stay: Payer: Medicare Other

## 2020-06-25 ENCOUNTER — Encounter: Payer: Self-pay | Admitting: Internal Medicine

## 2020-06-25 ENCOUNTER — Inpatient Hospital Stay (HOSPITAL_BASED_OUTPATIENT_CLINIC_OR_DEPARTMENT_OTHER): Payer: Medicare Other | Admitting: Internal Medicine

## 2020-06-25 ENCOUNTER — Telehealth: Payer: Self-pay

## 2020-06-25 VITALS — BP 129/82 | HR 83 | Temp 98.3°F | Resp 18 | Ht 71.0 in | Wt 203.7 lb

## 2020-06-25 DIAGNOSIS — E079 Disorder of thyroid, unspecified: Secondary | ICD-10-CM

## 2020-06-25 DIAGNOSIS — Z510 Encounter for antineoplastic radiation therapy: Secondary | ICD-10-CM | POA: Diagnosis not present

## 2020-06-25 DIAGNOSIS — R053 Chronic cough: Secondary | ICD-10-CM | POA: Diagnosis not present

## 2020-06-25 DIAGNOSIS — C7951 Secondary malignant neoplasm of bone: Secondary | ICD-10-CM | POA: Insufficient documentation

## 2020-06-25 DIAGNOSIS — C3491 Malignant neoplasm of unspecified part of right bronchus or lung: Secondary | ICD-10-CM

## 2020-06-25 DIAGNOSIS — Z5111 Encounter for antineoplastic chemotherapy: Secondary | ICD-10-CM

## 2020-06-25 DIAGNOSIS — C7931 Secondary malignant neoplasm of brain: Secondary | ICD-10-CM

## 2020-06-25 DIAGNOSIS — R590 Localized enlarged lymph nodes: Secondary | ICD-10-CM | POA: Diagnosis not present

## 2020-06-25 DIAGNOSIS — C342 Malignant neoplasm of middle lobe, bronchus or lung: Secondary | ICD-10-CM | POA: Insufficient documentation

## 2020-06-25 DIAGNOSIS — C787 Secondary malignant neoplasm of liver and intrahepatic bile duct: Secondary | ICD-10-CM | POA: Diagnosis not present

## 2020-06-25 DIAGNOSIS — J910 Malignant pleural effusion: Secondary | ICD-10-CM

## 2020-06-25 DIAGNOSIS — Z79899 Other long term (current) drug therapy: Secondary | ICD-10-CM | POA: Diagnosis not present

## 2020-06-25 DIAGNOSIS — Z923 Personal history of irradiation: Secondary | ICD-10-CM | POA: Diagnosis not present

## 2020-06-25 DIAGNOSIS — C3411 Malignant neoplasm of upper lobe, right bronchus or lung: Secondary | ICD-10-CM | POA: Diagnosis not present

## 2020-06-25 DIAGNOSIS — Z95828 Presence of other vascular implants and grafts: Secondary | ICD-10-CM

## 2020-06-25 LAB — CBC WITH DIFFERENTIAL (CANCER CENTER ONLY)
Abs Immature Granulocytes: 0.03 10*3/uL (ref 0.00–0.07)
Basophils Absolute: 0.1 10*3/uL (ref 0.0–0.1)
Basophils Relative: 1 %
Eosinophils Absolute: 0 10*3/uL (ref 0.0–0.5)
Eosinophils Relative: 1 %
HCT: 30.6 % — ABNORMAL LOW (ref 39.0–52.0)
Hemoglobin: 9.9 g/dL — ABNORMAL LOW (ref 13.0–17.0)
Immature Granulocytes: 1 %
Lymphocytes Relative: 20 %
Lymphs Abs: 1.1 10*3/uL (ref 0.7–4.0)
MCH: 32.1 pg (ref 26.0–34.0)
MCHC: 32.4 g/dL (ref 30.0–36.0)
MCV: 99.4 fL (ref 80.0–100.0)
Monocytes Absolute: 0.6 10*3/uL (ref 0.1–1.0)
Monocytes Relative: 12 %
Neutro Abs: 3.4 10*3/uL (ref 1.7–7.7)
Neutrophils Relative %: 65 %
Platelet Count: 366 10*3/uL (ref 150–400)
RBC: 3.08 MIL/uL — ABNORMAL LOW (ref 4.22–5.81)
RDW: 17.2 % — ABNORMAL HIGH (ref 11.5–15.5)
WBC Count: 5.2 10*3/uL (ref 4.0–10.5)
nRBC: 0 % (ref 0.0–0.2)

## 2020-06-25 LAB — CMP (CANCER CENTER ONLY)
ALT: 15 U/L (ref 0–44)
AST: 22 U/L (ref 15–41)
Albumin: 2.1 g/dL — ABNORMAL LOW (ref 3.5–5.0)
Alkaline Phosphatase: 77 U/L (ref 38–126)
Anion gap: 7 (ref 5–15)
BUN: 22 mg/dL (ref 8–23)
CO2: 27 mmol/L (ref 22–32)
Calcium: 8.7 mg/dL — ABNORMAL LOW (ref 8.9–10.3)
Chloride: 105 mmol/L (ref 98–111)
Creatinine: 0.75 mg/dL (ref 0.61–1.24)
GFR, Estimated: >60 mL/min (ref >60)
Glucose, Bld: 102 mg/dL — ABNORMAL HIGH (ref 70–99)
Potassium: 3.5 mmol/L (ref 3.5–5.1)
Sodium: 139 mmol/L (ref 135–145)
Total Bilirubin: 0.3 mg/dL (ref 0.3–1.2)
Total Protein: 6 g/dL — ABNORMAL LOW (ref 6.5–8.1)

## 2020-06-25 LAB — TSH: TSH: 1.559 u[IU]/mL (ref 0.320–4.118)

## 2020-06-25 MED ORDER — LORAZEPAM 1 MG PO TABS: 1.0000 mg | ORAL_TABLET | ORAL | 0 refills | Status: DC | PRN

## 2020-06-25 MED ORDER — SODIUM CHLORIDE 0.9% FLUSH
10.0000 mL | INTRAVENOUS | Status: AC
  Administered 2020-06-25: 10 mL via INTRAVENOUS

## 2020-06-25 MED ORDER — EXKIVITY 40 MG PO CAPS: 160.0000 mg | ORAL_CAPSULE | Freq: Every morning | ORAL | 3 refills | Status: DC

## 2020-06-25 MED ORDER — SODIUM CHLORIDE 0.9% FLUSH
10.0000 mL | INTRAVENOUS | Status: DC | PRN
  Administered 2020-06-25: 10 mL
  Filled 2020-06-25: qty 10

## 2020-06-25 MED ORDER — HEPARIN SOD (PORK) LOCK FLUSH 100 UNIT/ML IV SOLN
500.0000 [IU] | Freq: Once | INTRAVENOUS | Status: AC
  Administered 2020-06-25: 500 [IU] via INTRAVENOUS

## 2020-06-25 NOTE — Progress Notes (Signed)
DISCONTINUE ON PATHWAY REGIMEN - Non-Small Cell Lung     A cycle is every 21 days:     Pembrolizumab      Pemetrexed      Carboplatin   **Always confirm dose/schedule in your pharmacy ordering system**  REASON: Disease Progression PRIOR TREATMENT: [id number]: Pembrolizumab 200 mg + Pemetrexed 500 mg/m2 + Carboplatin AUC=5 q21 Days x 4-6 Cycles TREATMENT RESPONSE: Progressive Disease (PD)    Patient Characteristics: Stage IV Metastatic, Nonsquamous, Second Line - Molecular Targeted Therapy, EGFR Mutation ? Exon 20 Insertion Therapeutic Status: Stage IV Metastatic Histology: Nonsquamous Cell ROS1 Rearrangement Status: Negative Other Mutations/Biomarkers: No Other Actionable Mutations Chemotherapy/Immunotherapy LOT: Not Appropriate Molecular Targeted Therapy: Second Paediatric nurse Therapy KRAS G12C Mutation Status: Negative MET Exon 14 Mutation Status: Negative RET Gene Fusion Status: Negative EGFR Mutation Status: Positive - Exon 20 Insertion NTRK Gene Fusion Status: Negative PD-L1 Expression Status: Quantity Not Sufficient ALK Rearrangement Status: Negative BRAF V600E Mutation Status: Negative

## 2020-06-25 NOTE — Telephone Encounter (Signed)
Oral Oncology Patient Advocate Encounter  Met patient in lobby to complete application for [employer] in an effort to reduce patient's out of pocket expense for Exkivity to $0.    Application completed and faxed to [phone].   [employer] patient assistance phone number for follow up is [phone].   This encounter will be updated until final determination.   [place] [facility] [place] Phone [phone] Fax [phone] 06/25/2020 2:50 PM

## 2020-06-25 NOTE — Progress Notes (Signed)
  Radiation Oncology         [phone] ________________________________  Name: Ethan Brown MRN: [id number]  Date: 06/25/2020  DOB: 07/29/1951  SIMULATION AND TREATMENT PLANNING NOTE    ICD-10-CM   1. Brain metastasis (HCC)  C79.31 heparin lock flush 100 unit/mL    sodium chloride flush (NS) 0.9 % injection 10 mL    DIAGNOSIS:  69 y.o. man with two new brain metastases from Stage IV (T2b, N2, M1c) adenocarcinoma of the right middle lobe of the lung.  NARRATIVE:  The patient was brought to the [facility].  Identity was confirmed.  All relevant records and images related to the planned course of therapy were reviewed.  The patient freely provided informed written consent to proceed with treatment after reviewing the details related to the planned course of therapy. The consent form was witnessed and verified by the simulation staff. Intravenous access was established for contrast administration. Then, the patient was set-up in a stable reproducible supine position for radiation therapy.  A relocatable thermoplastic stereotactic head frame was fabricated for precise immobilization.  CT images were obtained.  Surface markings were placed.  The CT images were loaded into the planning software and fused with the patient's targeting MRI scan.  Then the target and avoidance structures were contoured.  Treatment planning then occurred.  The radiation prescription was entered and confirmed.  I have requested 3D planning  I have requested a DVH of the following structures: Brain stem, brain, left eye, right eye, lenses, optic chiasm, target volumes, uninvolved brain, and normal tissue.    SPECIAL TREATMENT PROCEDURE:  The planned course of therapy using radiation constitutes a special treatment procedure. Special care is required in the management of this patient for the following reasons. This treatment constitutes a Special Treatment Procedure for the following reason: High  dose per fraction requiring special monitoring for increased toxicities of treatment including daily imaging.  The special nature of the planned course of radiotherapy will require increased physician supervision and oversight to ensure patient's safety with optimal treatment outcomes.  PLAN:  The patient will receive 20 Gy in 1 fraction.  ________________________________  Sheral Apley Tammi Klippel, M.D.   This document serves as a record of services personally performed by Tyler Pita, MD. It was created on his behalf by Wilburn Mylar, a trained medical scribe. The creation of this record is based on the scribe's personal observations and the provider's statements to them. This document has been checked and approved by the attending provider.

## 2020-06-25 NOTE — Progress Notes (Signed)
Flushed and deaccessed right subclavian power port via protocol. Access needle intact upon removal. Bandaid applied over old access site. Patient tolerated well.

## 2020-06-25 NOTE — Telephone Encounter (Signed)
Oral Oncology Pharmacist Encounter  Received new prescription for Exkivity (mobocertinib) for the treatment of metastatic non-small cell lung cancer with EGFR exon 20 insertion mutation, planned duration until disease progression or unacceptable drug toxicity.  Prescription dose and frequency assessed for appropriateness. Plan for patient to initiate Exkivity 160 mg by mouth once daily, with or without food. Appropriate for therapy initiation.   CBC w/ Diff and CMP from 06/25/20 assessed, overall labs OK for treatment initiation. Baseline EKG obtained 06/25/20.   Current medication list in Epic reviewed, DDIs with Exkivity identified:  Category D DDI between [place] and Ondansetron - increased risk for QTc prolongation when both agents used together. Advised patient to utilize Compazine PRN N/V while on Exkivity instead of ondansetron.   Evaluated chart and no patient barriers to medication adherence noted.   PA approved for Exkivity. Patient's copay for 1st fill of [place] is [phone], which is unaffordable for patient. Prescription has been sent to [employer]'s patient assistance [employer] program.  Oral Oncology Clinic will continue to follow for insurance authorization, copayment issues, initial counseling and start date.  Leron Croak, PharmD, BCPS Hematology/Oncology Clinical Pharmacist [facility] [phone] 06/25/2020 2:46 PM

## 2020-06-25 NOTE — Progress Notes (Signed)
[place] [phone]   [phone]  OFFICE PROGRESS NOTE  Laurey Morale, MD [place] [address]  DIAGNOSIS: Stage IV (T2b, N2, M1c) presented with right middle lobe lung mass in addition to mediastinal lymphadenopathy as well as malignant right pleural effusion with pleural-based nodules and suspicious right hepatic lesion and the brain metastasis diagnosed in July 2020.  Molecular Biomarkers: [id number] (Exon 20 insertion) 0.2% Dacomitinib,Neratinib,Osimertinib  [id number] 0.1% None   PRIOR THERAPY: Systemic chemotherapy with carboplatin for AUC of 5, Alimta 500 mg/M2 and Keytruda 200 mg IV every 3 weeks.  First dose April 04, 2019.  Status post 21 cycles  Starting from cycle #5 the patient is on maintenance treatment with Alimta and Keytruda every 3 weeks.  Starting from cycle #19 he will be on single agent Alimta.  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  CURRENT THERAPY: Monocertinib (Axkibity) 947 mg p.o. daily.  Expected to start in the next few days.  INTERVAL HISTORY: Ethan Brown 69 y.o. male returns to the clinic today for follow-up visit.  The patient is feeling a little bit better but he continues to have persistent cough and shortness of breath which is improved compared to a few weeks ago.  He denied having any chest pain or hemoptysis.  He denied having any nausea, vomiting, diarrhea or constipation.  He denied having any fever or chills.  He has no weight loss or night sweats.  He had a PET scan as well as MRI of the brain performed recently and he is here for evaluation and discussion of his imaging results and treatment options.   MEDICAL HISTORY: Past Medical History:  Diagnosis Date  . Borderline hypertension   . Brain metastasis ([place]) dx'd 01/2019  . Detached vitreous humor, left   . Dyspnea   . ED (erectile dysfunction) of organic origin    . Hyperlipidemia   . Hypertension   . Lens subluxation, left   . Lung cancer ([facility]) dx'd 01/2019   Stage IV  . Migraine headache    takes PRN Imitrex  . Pseudophakia   . TGA (transient global amnesia) 2017    ALLERGIES:  has No Known Allergies.  MEDICATIONS:  Current Outpatient Medications  Medication Sig Dispense Refill  . chlorpheniramine-HYDROcodone (TUSSIONEX) 10-8 MG/5ML SUER Take 5 mLs by mouth at bedtime as needed for cough. 140 mL 0  . cyanocobalamin (,VITAMIN B-12,) 1000 MCG/ML injection INJECT 1 ML (1,000 MCG TOTAL) INTO THE MUSCLE ONCE FOR 1 DOSE. 5 mL 1  . folic acid (FOLVITE) 1 MG tablet TAKE 1 TABLET BY MOUTH EVERY DAY 90 tablet 1  . furosemide (LASIX) 20 MG tablet Take once daily for 3 days. Repeat as needed for recurrent edema. 30 tablet 1  . guaiFENesin-codeine 100-10 MG/5ML syrup Take 10 mLs by mouth every 4 (four) hours as needed for cough. 240 mL 0  . hydrochlorothiazide (MICROZIDE) 12.5 MG capsule Take 1 capsule (12.5 mg total) by mouth 2 (two) times daily. 180 capsule 2  . lidocaine-prilocaine (EMLA) cream Apply to the Port-A-Cath site 30-60 minutes before treatment 30 g 0  . LORazepam (ATIVAN) 2 MG tablet Take 0.5 tablets (1 mg total) by mouth at bedtime as needed for sleep. 20 tablet 0  . Multiple Vitamin (MULTIVITAMIN WITH MINERALS) TABS tablet Take 1 tablet by mouth daily.    . ondansetron (ZOFRAN) 8 MG tablet Take 1 tablet (8 mg total) by mouth  every 8 (eight) hours as needed for nausea or vomiting. 30 tablet 2  . predniSONE (DELTASONE) 10 MG tablet PLEASE TAKE 9 TABLETS (90 MG) BY MOUTH DAILY FOR 10 DAYS, FOLLOWED BY 7 TABLETS (70 MG) DAILY FOR 7 DAYS, FOLLOWED BY 5 TABLETS (50 MG) DAILY FOR 7 DAYS, FOLLOWED BY 3 TABLETS (30 MG) DAILY FOR 7 DAYS, FOLLOWED BY 1 TABLET (10 MG) DAILY FOR 7 DAYS, FOLLOWED BY 1/2 TABLETS FOR 6 DAYS. 205 tablet 0  . prochlorperazine (COMPAZINE) 10 MG tablet Take 1 tablet (10 mg total) by mouth every 6 (six) hours as needed for nausea  or vomiting. 30 tablet 1  . sildenafil (REVATIO) 20 MG tablet TAKE AS DIRECTED (Patient not taking: Reported on 06/03/2020) 10 tablet 11  . sulfamethoxazole-trimethoprim (BACTRIM DS) 800-160 MG tablet Take 1 tablet by mouth 2 (two) times daily. 14 tablet 0  . tamsulosin (FLOMAX) 0.4 MG CAPS capsule Take 0.4 mg by mouth daily.     No current facility-administered medications for this visit.   Facility-Administered Medications Ordered in Other Visits  Medication Dose Route Frequency Provider Last Rate Last Admin  . sodium chloride flush (NS) 0.9 % injection 10 mL  10 mL Intracatheter PRN Curt Bears, MD   10 mL at 06/25/20 0950    SURGICAL HISTORY:  Past Surgical History:  Procedure Laterality Date  . CAROTID DOPPLERS  09/2017   Mild non-obstructive disease  . CATARACT EXTRACTION, BILATERAL    . cataract, left  2018  . CHEST TUBE INSERTION Right 03/29/2019   Procedure: INSERTION PLEURAL DRAINAGE CATHETER;  Surgeon: [name], MD;  Location: [facility];  Service: Thoracic;  Laterality: Right;  . COLONOSCOPY  2016   clear, repeat in 10 yrs   . Eye surgeries     , Lens attachment.  Vitrectomy  . FEMORAL HERNIA REPAIR    . HERNIA REPAIR    . IR THORACENTESIS ASP PLEURAL SPACE W/IMG GUIDE  02/22/2019  . IR THORACENTESIS ASP PLEURAL SPACE W/IMG GUIDE  03/13/2019  . IR THORACENTESIS ASP PLEURAL SPACE W/IMG GUIDE  03/18/2020  . LUMBAR LAMINECTOMY    . PLEURAL BIOPSY Right 03/29/2019   Procedure: PLEURAL BIOPSY;  Surgeon: [name], MD;  Location: [facility];  Service: Thoracic;  Laterality: Right;  . PORTACATH PLACEMENT N/A 03/29/2019   Procedure: INSERTION PORT-A-CATH;  Surgeon: [name], MD;  Location: [facility];  Service: Thoracic;  Laterality: N/A;  . retinal attachment, right    . sclearl buckle, right    . victrectomy,right    . VIDEO ASSISTED THORACOSCOPY Right 03/29/2019   Procedure: VIDEO ASSISTED THORACOSCOPY;  Surgeon: [name], MD;  Location[facility]  OR;  Service: Thoracic;  Laterality: Right;    REVIEW OF SYSTEMS:  Constitutional: positive for fatigue Eyes: negative Ears, nose, mouth, throat, and face: negative Respiratory: positive for cough and dyspnea on exertion Cardiovascular: negative Gastrointestinal: negative Genitourinary:negative Integument/breast: negative Hematologic/lymphatic: negative Musculoskeletal:negative Neurological: negative Behavioral/Psych: negative Endocrine: negative Allergic/Immunologic: negative   PHYSICAL EXAMINATION: General appearance: alert, cooperative, fatigued and no distress Head: Normocephalic, without obvious abnormality, atraumatic Neck: no adenopathy, no JVD, supple, symmetrical, trachea midline and thyroid not enlarged, symmetric, no tenderness/mass/nodules Lymph nodes: Cervical, supraclavicular, and axillary nodes normal. Resp: clear to auscultation bilaterally Back: symmetric, no curvature. ROM normal. No CVA tenderness. Cardio: regular rate and rhythm, S1, S2 normal, no murmur, click, rub or gallop GI: soft, non-tender; bowel sounds normal; no masses,  no organomegaly Extremities: extremities normal, atraumatic, no cyanosis  or edema Neurologic: Alert and oriented X 3, normal strength and tone. Normal symmetric reflexes. Normal coordination and gait  ECOG PERFORMANCE STATUS: 1 - Symptomatic but completely ambulatory  Blood pressure 129/82, pulse 83, temperature 98.3 F (36.8 C), temperature source Tympanic, resp. rate 18, height '5\' 11"'  (1.803 m), weight 203 lb 11.2 oz (92.4 kg), SpO2 97 %.  LABORATORY DATA: Lab Results  Component Value Date   WBC 5.2 06/25/2020   HGB 9.9 (L) 06/25/2020   HCT 30.6 (L) 06/25/2020   MCV 99.4 06/25/2020   PLT 366 06/25/2020      Chemistry      Component Value Date/Time   NA 137 06/14/2020 1409   K 4.5 06/14/2020 1409   CL 99 06/14/2020 1409   CO2 31 06/14/2020 1409   BUN 12 06/14/2020 1409   CREATININE 0.89 06/14/2020 1409      Component  Value Date/Time   CALCIUM 9.1 06/14/2020 1409   ALKPHOS 78 06/14/2020 1409   AST 30 06/14/2020 1409   ALT 24 06/14/2020 1409   BILITOT <0.2 (L) 06/14/2020 1409       RADIOGRAPHIC STUDIES: CT Chest W Contrast  Result Date: 06/03/2020 CLINICAL DATA:  Non-small cell lung cancer staging, follow-up evaluation. Persistent cough with ongoing systemic therapy EXAM: CT CHEST, ABDOMEN, AND PELVIS WITH CONTRAST TECHNIQUE: Multidetector CT imaging of the chest, abdomen and pelvis was performed following the standard protocol during bolus administration of intravenous contrast. CONTRAST:  13m OMNIPAQUE IOHEXOL 300 MG/ML  SOLN COMPARISON:  February 09, 2020 FINDINGS: CT CHEST FINDINGS Cardiovascular: Heart size is normal. Pericardial nodularity similar to the prior exam. Aortic caliber is normal. Central pulmonary vasculature with unremarkable appearance on venous phase assessment. Mediastinum/Nodes: Thoracic inlet structures are normal. RIGHT-sided Port-A-Cath terminates in the mid SVC similar to the prior study. 11 mm subcarinal lymph node previously 10 mm. Subtle nodularity along the RIGHT mediastinal border, for instance on image 30 of series 2 5 mm greatest thickness previously approximately 4 mm, grossly similar. No LEFT hilar adenopathy. Lungs/Pleura: Increasing size of RIGHT-sided pleural fluid, depth of loculated pleural fluid in the RIGHT chest 5.2 cm as compared to approximately 2.3 cm on the prior study. Pleural nodularity in the inferior RIGHT chest (image 48, series 2) 9 mm nodule lateral to the RIGHT heart previously 6 mm. Inferior costodiaphragmatic recess with subtle nodularity anteriorly Loculated fluid extending into the medial RIGHT chest as before. Subtle diaphragmatic nodularity with increased, nodule over the RIGHT hemidiaphragm measuring 12 mm on image 49 of series 2 not seen as a discrete nodule on the prior study. Increasing parenchymal consolidative changes in the dependent RIGHT chest.  Diffuse interstitial thickening with signs of central nodularity adjacent to the RIGHT hilum on image 84 of series 7 with 16 x 15 mm size today previously 18 x 11 mm, within 1 mm when measured in a similar fashion to the prior study but with small nodules just peripheral to this on image 83 of series 7 displaying increase in size with numerous nodules along the minor fissure, largest approximately 8 mm. Multiple small nodules track along the minor fissure in the RIGHT chest these are increasing in conspicuity over time. Increased fissural nodularity in the inferior RIGHT chest on image 107 of series 7 where there is a 1.8 x 0.8 cm area of nodularity that was previously approximately 1 x 0.6 cm. Similar appearance of LEFT-sided pleural fluid airways are patent. Musculoskeletal: No discrete chest wall mass, see below for full musculoskeletal details.  CT ABDOMEN PELVIS FINDINGS Hepatobiliary: Liver without focal lesion. No pericholecystic stranding. No biliary duct dilation. Pancreas: Pancreas without ductal dilation or sign of inflammation. Spleen: Spleen normal in size and contour similar to prior study. Adrenals/Urinary Tract: Adrenal glands are normal. Symmetric renal enhancement. No suspicious renal lesion. No hydronephrosis. Urinary bladder unremarkable. Stomach/Bowel: Stomach under distended limiting assessment. No acute gastrointestinal process colonic diverticulosis. Vascular/Lymphatic: Vascular structures in the abdomen are patent. There is no gastrohepatic or hepatoduodenal ligament lymphadenopathy. No retroperitoneal or mesenteric lymphadenopathy. Reproductive: Post vasectomy. The prostatomegaly. No pelvic adenopathy. Other: No ascites. Musculoskeletal: Sclerotic lesion in the RIGHT iliac, areas of subtle sclerosis before now measuring 2.5 x 1.3 cm, clearly a change from prior exams. Subtle sclerotic focus in the L1 vertebral body which is new compared to previous imaging IMPRESSION: 1. Increasing  nodularity about the RIGHT hilum and along the fissures, minor fissure and inferior major fissure in the RIGHT chest in particular. Findings suspicious for worsening of disease and associated with involvement along the RIGHT mediastinal border and with lymphangitic carcinomatosis. There is also subtle pericardial nodularity. 2. Increasing size of RIGHT pleural fluid with loculated appearance. No signs of gas. Findings likely related to enlarging malignant effusion given other findings in the chest. Correlate with any signs of infection though this is not favored. 3. New sclerotic focus in L1 and signs of bony sclerosis in the RIGHT iliac crest compatible with metastatic disease. 4. Given constellation of findings above PET-CT may be helpful for further evaluation as clinically warranted. 5. Similar appearance of LEFT-sided pleural fluid. 6. Aortic atherosclerosis. Aortic Atherosclerosis (ICD10-I70.0). Electronically Signed   By: Zetta Bills M.D.   On: 06/03/2020 18:37   MR Brain W Wo Contrast  Result Date: 06/20/2020 CLINICAL DATA:  Metastatic lung cancer post radiation EXAM: MRI HEAD WITHOUT AND WITH CONTRAST TECHNIQUE: Multiplanar, multiecho pulse sequences of the brain and surrounding structures were obtained without and with intravenous contrast. CONTRAST:  54m MULTIHANCE GADOBENATE DIMEGLUMINE 529 MG/ML IV SOLN COMPARISON:  01/04/2020 FINDINGS: Brain: New 5 mm enhancing lesion of the right superior temporal gyrus (series 11, image 74). No associated edema. New 2 mm focus enhancement in the periventricular white matter adjacent the body of the left lateral ventricle (series 11, image 98) Previously seen 2 mm focus of enhancement in the right thalamocapsular region has resolved. There is now a punctate focus susceptibility in this region likely reflecting chronic blood products or mineralization. Probable subtle residual enhancement along the ventral aspect of the right postcentral gyrus (series 11,  image 112). Corresponding susceptibility reflecting chronic blood products or mineralization again seen. There is no acute infarction or intracranial hemorrhage. There is no hydrocephalus or extra-axial fluid collection. Ventricles and sulci are stable in size and configuration. Few small foci of T2 hyperintensity are again identified in the supratentorial white matter likely reflecting stable microvascular ischemic changes. No abnormal enhancement. Vascular: Major vessel flow voids at the skull base are preserved. Skull and upper cervical spine: Normal marrow signal is preserved. Sinuses/Orbits: Mild mucosal thickening.  Orbits are unremarkable. Other: Sella is unremarkable. Minor left mastoid fluid opacification. IMPRESSION: New 5 mm right temporal lesion suspicious for metastasis. New 2 mm focus of enhancement in the left periventricular white matter, which also could reflect a metastasis. Resolution of previous right thalamocapsular region enhancement. Stable treated right parietal metastasis. Electronically Signed   By: PMacy MisM.D.   On: 06/20/2020 16:16   CT Abdomen Pelvis W Contrast  Result Date: 06/03/2020 CLINICAL DATA:  Non-small cell lung cancer staging, follow-up evaluation. Persistent cough with ongoing systemic therapy EXAM: CT CHEST, ABDOMEN, AND PELVIS WITH CONTRAST TECHNIQUE: Multidetector CT imaging of the chest, abdomen and pelvis was performed following the standard protocol during bolus administration of intravenous contrast. CONTRAST:  11m OMNIPAQUE IOHEXOL 300 MG/ML  SOLN COMPARISON:  February 09, 2020 FINDINGS: CT CHEST FINDINGS Cardiovascular: Heart size is normal. Pericardial nodularity similar to the prior exam. Aortic caliber is normal. Central pulmonary vasculature with unremarkable appearance on venous phase assessment. Mediastinum/Nodes: Thoracic inlet structures are normal. RIGHT-sided Port-A-Cath terminates in the mid SVC similar to the prior study. 11 mm subcarinal lymph  node previously 10 mm. Subtle nodularity along the RIGHT mediastinal border, for instance on image 30 of series 2 5 mm greatest thickness previously approximately 4 mm, grossly similar. No LEFT hilar adenopathy. Lungs/Pleura: Increasing size of RIGHT-sided pleural fluid, depth of loculated pleural fluid in the RIGHT chest 5.2 cm as compared to approximately 2.3 cm on the prior study. Pleural nodularity in the inferior RIGHT chest (image 48, series 2) 9 mm nodule lateral to the RIGHT heart previously 6 mm. Inferior costodiaphragmatic recess with subtle nodularity anteriorly Loculated fluid extending into the medial RIGHT chest as before. Subtle diaphragmatic nodularity with increased, nodule over the RIGHT hemidiaphragm measuring 12 mm on image 49 of series 2 not seen as a discrete nodule on the prior study. Increasing parenchymal consolidative changes in the dependent RIGHT chest. Diffuse interstitial thickening with signs of central nodularity adjacent to the RIGHT hilum on image 84 of series 7 with 16 x 15 mm size today previously 18 x 11 mm, within 1 mm when measured in a similar fashion to the prior study but with small nodules just peripheral to this on image 83 of series 7 displaying increase in size with numerous nodules along the minor fissure, largest approximately 8 mm. Multiple small nodules track along the minor fissure in the RIGHT chest these are increasing in conspicuity over time. Increased fissural nodularity in the inferior RIGHT chest on image 107 of series 7 where there is a 1.8 x 0.8 cm area of nodularity that was previously approximately 1 x 0.6 cm. Similar appearance of LEFT-sided pleural fluid airways are patent. Musculoskeletal: No discrete chest wall mass, see below for full musculoskeletal details. CT ABDOMEN PELVIS FINDINGS Hepatobiliary: Liver without focal lesion. No pericholecystic stranding. No biliary duct dilation. Pancreas: Pancreas without ductal dilation or sign of inflammation.  Spleen: Spleen normal in size and contour similar to prior study. Adrenals/Urinary Tract: Adrenal glands are normal. Symmetric renal enhancement. No suspicious renal lesion. No hydronephrosis. Urinary bladder unremarkable. Stomach/Bowel: Stomach under distended limiting assessment. No acute gastrointestinal process colonic diverticulosis. Vascular/Lymphatic: Vascular structures in the abdomen are patent. There is no gastrohepatic or hepatoduodenal ligament lymphadenopathy. No retroperitoneal or mesenteric lymphadenopathy. Reproductive: Post vasectomy. The prostatomegaly. No pelvic adenopathy. Other: No ascites. Musculoskeletal: Sclerotic lesion in the RIGHT iliac, areas of subtle sclerosis before now measuring 2.5 x 1.3 cm, clearly a change from prior exams. Subtle sclerotic focus in the L1 vertebral body which is new compared to previous imaging IMPRESSION: 1. Increasing nodularity about the RIGHT hilum and along the fissures, minor fissure and inferior major fissure in the RIGHT chest in particular. Findings suspicious for worsening of disease and associated with involvement along the RIGHT mediastinal border and with lymphangitic carcinomatosis. There is also subtle pericardial nodularity. 2. Increasing size of RIGHT pleural fluid with loculated appearance. No signs of gas. Findings likely related to enlarging  malignant effusion given other findings in the chest. Correlate with any signs of infection though this is not favored. 3. New sclerotic focus in L1 and signs of bony sclerosis in the RIGHT iliac crest compatible with metastatic disease. 4. Given constellation of findings above PET-CT may be helpful for further evaluation as clinically warranted. 5. Similar appearance of LEFT-sided pleural fluid. 6. Aortic atherosclerosis. Aortic Atherosclerosis (ICD10-I70.0). Electronically Signed   By: Zetta Bills M.D.   On: 06/03/2020 18:37   NM PET Image Restag (PS) Skull Base To Thigh  Addendum Date: 06/24/2020    ADDENDUM REPORT: 06/24/2020 08:10 ADDENDUM: As stated in the original report, comparison was made with CT chest abdomen pelvis 06/03/2020 and PET 03/27/2019. In the very short 2-week interval from 06/03/2020, findings in the chest are unchanged. Direct comparison with 03/27/2019 is of limited utility, given multiple interval CT exams. Electronically Signed   By: Lorin Picket M.D.   On: 06/24/2020 08:10   Result Date: 06/24/2020 CLINICAL DATA:  Subsequent treatment strategy for lung cancer. EXAM: NUCLEAR MEDICINE PET SKULL BASE TO THIGH TECHNIQUE: 9.5 mCi F-18 FDG was injected intravenously. Full-ring PET imaging was performed from the skull base to thigh after the radiotracer. CT data was obtained and used for attenuation correction and anatomic localization. Fasting blood glucose: 95 mg/dl COMPARISON:  CT chest abdomen pelvis 06/03/2020 and PET 03/27/2019. FINDINGS: Mediastinal blood pool activity: SUV max 2.2 Liver activity: SUV max NA NECK: No hypermetabolic lymph nodes. Incidental CT findings: None. CHEST: 5 mm right supraclavicular lymph node (9/32) is hypermetabolic, SUV max 3.7. 8 mm low right internal jugular lymph node (4/55) has an SUV max of 5.2. Hypermetabolic 5 mm low left paratracheal lymph node (4/78), SUV max 3.3. Hypermetabolic subcarinal lymph nodes measure up to 8 mm (4/84), SUV max 10.2. Hypermetabolic right hilar lymph nodes have an SUV max of 5.1. No axillary adenopathy. Residual hypermetabolic pleuroparenchymal thickening in the lower right hemithorax (SUV max 9.2, 4/108). Incidental CT findings: Right IJ Port-A-Cath terminates in the low SVC. Heart is at the upper limits of normal in size. No pericardial effusion. Loculated moderate right pleural effusion in the lower right hemithorax with associated pleural thickening and nodularity. Small left pleural effusion, stable. Septal thickening in the lower right hemithorax. ABDOMEN/PELVIS: No abnormal hypermetabolism in the liver, adrenal  glands, spleen or pancreas. Small hypermetabolic abdominal retroperitoneal lymph nodes are seen with an index aortocaval lymph node measuring 6 mm (4/153), SUV max 4.0. Incidental CT findings: Liver, gallbladder, adrenal glands, kidneys, spleen, pancreas, stomach and bowel are grossly unremarkable. Haziness and slight nodularity in the small bowel mesentery, unchanged. Slight bladder wall thickening with a left lateral diverticulum. Prostate is enlarged. SKELETON: There are new hypermetabolic sclerotic lesions in posterior right ribs, left scapula L1 and the medial right iliac wing. Index lesion in the right iliac wing measures 1.5 x 2.9 cm (4/169) with an SUV max of 5.3. Incidental CT findings: Degenerative changes in the spine. IMPRESSION: 1. Metastatic disease involving thoracic and abdominal lymph nodes, right hemithorax and bones. 2. Loculated moderate right pleural effusion. Small left pleural effusion. 3. Prostate enlargement. Bladder wall thickening is indicative of an element of outlet obstruction. Electronically Signed: By: Lorin Picket M.D. On: 06/19/2020 09:20    ASSESSMENT AND PLAN: This is a very pleasant 69 years old white male recently diagnosed with a stage IV (T2b, N2, M1C) non-small cell lung cancer, adenocarcinoma with positive EGFR mutation in exon 20 (resistant mutation) diagnosed in July 2020 and  presented with extensive disease involving the right upper lobe as well as the right middle and lower lobe with extensive pleural based metastasis as well as mediastinal and hilar disease with malignant pleural effusion as well as liver and brain metastasis. Unfortunately the patient has no actionable mutations based on the molecular studies by [facility].   The patient is currently undergoing systemic chemotherapy with carboplatin for AUC of 5, Alimta 500 mg/M2 and Keytruda 200 mg IV every 3 weeks status post 21 cycles.  Starting from cycle #5 the patient is on maintenance treatment with  Alimta and Keytruda. Beryle Flock was discontinued after cycle #18 secondary to recurrent immunotherapy mediated pneumonitis. Starting from cycle #19 the patient will be on treatment with single agent Alimta every 3 weeks. The patient has been tolerating the treatment well. He had repeat PET scan and MRI of the brain performed recently.  The PET scan showed evidence for disease progression with metastatic disease involving the thoracic and abdominal lymph nodes as well as the right hemithorax and bones.  He also has loculated moderate right pleural effusion and small left pleural effusion.  The PET scan also showed prostate enlargement and his PSA has been rising recently.  He was seen by urology. MRI of the brain showed development of new 5 mm right temporal lesion suspicious for metastatic disease as well as new 2 mm focus of enhancement in the left periventricular white matter also suspicious for metastatic disease.  He was seen by radiation oncology and expected to have [place] to this lesion soon. I had a lengthy discussion with the patient today about his current condition and treatment options.  I recommended for the patient to discontinue his current systemic chemotherapy with Alimta. 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  The patient will start treatment with Monocertinib (Axkibity) 009 mg p.o. daily in the next few days. We discussed with him the adverse effect of this treatment and he will receive education and handout from the pharmacist for oral medications. We will have an EKG performed earlier today before starting the treatment to rule out QT prolongation. The patient will come back for follow-up visit in 3 weeks for evaluation and repeat blood work for close monitoring of his condition and the adverse effect of the treatment. He was advised to call immediately if he has any  concerning symptoms in the interval.  The patient voices understanding of current disease status and treatment options and is in agreement with the current care plan. All questions were answered. The patient knows to call the clinic with any problems, questions or concerns. We can certainly see the patient much sooner if necessary.  Disclaimer: This note was dictated with voice recognition software. Similar sounding words can inadvertently be transcribed and may not be corrected upon review.

## 2020-06-26 ENCOUNTER — Ambulatory Visit: Payer: Medicare Other | Attending: Medical

## 2020-06-26 ENCOUNTER — Encounter: Payer: Self-pay | Admitting: Medical

## 2020-06-26 DIAGNOSIS — R600 Localized edema: Secondary | ICD-10-CM | POA: Diagnosis not present

## 2020-06-26 DIAGNOSIS — M79661 Pain in right lower leg: Secondary | ICD-10-CM | POA: Insufficient documentation

## 2020-06-26 DIAGNOSIS — M79662 Pain in left lower leg: Secondary | ICD-10-CM | POA: Insufficient documentation

## 2020-06-26 DIAGNOSIS — C3491 Malignant neoplasm of unspecified part of right bronchus or lung: Secondary | ICD-10-CM | POA: Diagnosis not present

## 2020-06-26 NOTE — Telephone Encounter (Signed)
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   Counseled patient on administration, dosing, side effects, monitoring, drug-food interactions, safe handling, storage, and disposal.  Patient will take Exkivity 40 capsules, 4 capsules (160 mg total) by mouth once daily, without regard to food.  Patient knows to avoid grapefruit or grapefruit juice while on Exkivity.  Exkivity start date pending medication acquisition from Bernita Buffy - will update encounter once patient has received medication  Adverse effects include but are not limited to: diarrhea, nausea, mouth sores, decreased appetitie, fatigue, dry skin, rash, nail changes, musculoskeletal pain, altered cardiac conduction, and decreased blood counts or electrolytes.   Discussed with rare but serious risk for interstitial lung disease/pneumonitis with Exkivity.   Patient will obtain anti diarrheal and alert the office of 4 or more loose stools above baseline.   Reviewed with patient importance of keeping a medication schedule and plan for any missed doses. No barriers to medication adherence identified.  Medication reconciliation performed and medication/allergy list updated.  Insurance authorization for [employer] has been obtained. Test claim at the pharmacy revealed copayment [phone] for 1st fill of Exkivity. This is unaffordable for patient. Obtained patient's signature for manufacturer assistance during office visit and faxed to [facility].   All questions answered.  Ethan Brown voiced understanding and appreciation.   Medication education handout given to patient. Patient knows to call the office with questions or concerns. Oral Chemotherapy Clinic phone number provided to patient.   Leron Croak,  PharmD, BCPS Hematology/Oncology Clinical Pharmacist [facility] [phone] 06/26/2020 7:56 AM

## 2020-06-26 NOTE — Therapy (Signed)
[place], [place], [address] Phone: [phone]   Fax:  [phone]  Physical Therapy Evaluation  Patient Details  Name: Ethan Brown MRN: [id number] Date of Birth: 02/15/1951 Referring Provider (PT): Tanner   Encounter Date: 06/26/2020   PT End of Session - 06/26/20 1225    Visit Number 1    Number of Visits 25    Date for PT Re-Evaluation 08/21/20    PT Start Time 1103    PT Stop Time 1200    PT Time Calculation (min) 57 min    Activity Tolerance Patient tolerated treatment well    Behavior During Therapy [facility] for tasks assessed/performed           Past Medical History:  Diagnosis Date  . Borderline hypertension   . Brain metastasis ([place]) dx'd 01/2019  . Detached vitreous humor, left   . Dyspnea   . ED (erectile dysfunction) of organic origin   . Hyperlipidemia   . Hypertension   . Lens subluxation, left   . Lung cancer ([place]) dx'd 01/2019   Stage IV  . Migraine headache    takes PRN Imitrex  . Pseudophakia   . TGA (transient global amnesia) 2017    Past Surgical History:  Procedure Laterality Date  . CAROTID DOPPLERS  09/2017   Mild non-obstructive disease  . CATARACT EXTRACTION, BILATERAL    . cataract, left  2018  . CHEST TUBE INSERTION Right 03/29/2019   Procedure: INSERTION PLEURAL DRAINAGE CATHETER;  Surgeon: [name], MD;  Location: [facility];  Service: Thoracic;  Laterality: Right;  . COLONOSCOPY  2016   clear, repeat in 10 yrs   . Eye surgeries     , Lens attachment.  Vitrectomy  . FEMORAL HERNIA REPAIR    . HERNIA REPAIR    . IR THORACENTESIS ASP PLEURAL SPACE W/IMG GUIDE  02/22/2019  . IR THORACENTESIS ASP PLEURAL SPACE W/IMG GUIDE  03/13/2019  . IR THORACENTESIS ASP PLEURAL SPACE W/IMG GUIDE  03/18/2020  . LUMBAR LAMINECTOMY    . PLEURAL BIOPSY Right 03/29/2019   Procedure: PLEURAL BIOPSY;  Surgeon: [name], MD;  Location: [facility];  Service:  Thoracic;  Laterality: Right;  . PORTACATH PLACEMENT N/A 03/29/2019   Procedure: INSERTION PORT-A-CATH;  Surgeon: [name], MD;  Location: [facility];  Service: Thoracic;  Laterality: N/A;  . retinal attachment, right    . sclearl buckle, right    . victrectomy,right    . VIDEO ASSISTED THORACOSCOPY Right 03/29/2019   Procedure: VIDEO ASSISTED THORACOSCOPY;  Surgeon: [name], MD;  Location: [facility];  Service: Thoracic;  Laterality: Right;    There were no vitals filed for this visit.    Subjective Assessment - 06/26/20 1111    Subjective Developed swelling in both legs secondary to low Albumin from chemotherapy.  Tried lasix which didn't help and won't help until he can get Albumin up. Both legs are equally swollen. This is second episode.  The first episode started around May 2021  nearly went away and then he had chemo and a stomach virus over a couple of weeks and lost 20lbs.  Leg swelling really surged at that point. Legs hurt.  When he elevates his legs doesn't see any difference and when he stands up from elevation he gets a burning pain and rush into both legs for about 15 min.Marland Kitchen  Also notes very mild swelling in his arms.    Pertinent History  Diagnosed February 10, 2019 with Lung CA stage 4.  Had surgery July or August 20/20, Started chemo shortly thereafter.  No longer having keytruda infusions or others because they aren't working.  Takes a tablet now that addresses his marker but it is very expensive/  Has had 2 radiation treatments on his brain and has 1 left.  Also has prostate CA, Had a PET scan a few weeks ago and has METS to his vertebrae.  Will have a multi D meeting next week with oncologist, radiologist, and urologist    Patient Stated Goals Decrease swelling in legs    Currently in Pain? Yes    Pain Score 4     Pain Location Leg    Pain Orientation Right;Left    Pain Descriptors / Indicators Burning    Aggravating Factors  After pt elevates legs when he stands up  gets a rush and severe burning pain for about 15 min before he can walk              [facility] PT Assessment - 06/26/20 0001      Assessment   Medical Diagnosis Stage 4 Lung CA    Referring Provider (PT) Sandi Mealy PA     Precautions   Precautions --   cellulitis; just off antibiotics but still very red   Precaution Comments possible cellulitis still present      Restrictions   Weight Bearing Restrictions No      Balance Screen   Has the patient fallen in the past 6 months No    Has the patient had a decrease in activity level because of a fear of falling?  Yes    Is the patient reluctant to leave their home because of a fear of falling?  No      Home Environment   Living Environment Private residence    Living Arrangements Spouse/significant other    Available Help at Discharge Family      Prior Function   Level of Independence Independent      Cognition   Overall Cognitive Status Within Functional Limits for tasks assessed      Observation/Other Assessments   Observations lower legs very red and shiny    Skin Integrity no skin breakdown but potential for lymph leakage      ROM / Strength   AROM / PROM / Strength --   WFL for ROM/strength     Palpation   Palpation comment skin very firm, shiny and very tender in areas of redness      Transfers   Transfers Independent with all Transfers             LYMPHEDEMA/ONCOLOGY QUESTIONNAIRE - 06/26/20 0001      Date Lymphedema/Swelling Started   Date 11/25/19      Treatment   Active Chemotherapy Treatment Yes   tablet   Past Chemotherapy Treatment Yes    Active Radiation Treatment Yes    Date 03/26/20    Past Radiation Treatment No      What other symptoms do you have   Are you Having Heaviness or Tightness Yes    Are you having Pain Yes   burning   Are you having pitting edema Yes    Body Site legs    Is it Hard or Difficult finding clothes that fit Yes    Do you have infections Yes    Comments --   10  days antiobiotics ended Nov. 1     Lymphedema  Stage   Stage STAGE 2 SPONTANEOUSLY IRREVERSIBLE      Right Lower Extremity Lymphedema   At Groin Measure at Horizontal from Pubic Bone 53.6 cm    20 cm Proximal to Suprapatella 52.2 cm    10 cm Proximal to Suprapatella 48.2 cm    At Midpatella/Popliteal Crease 43.5 cm    30 cm Proximal to Floor at Lateral Plantar Foot 41.1 cm    20 cm Proximal to Floor at Lateral Plantar Foot 30.8 1    10  cm Proximal to Floor at Lateral Malleoli 27 cm    5 cm Proximal to 1st MTP Joint 27 cm    Across MTP Joint 27.2 cm    Around Proximal Great Toe 9.5 cm      Left Lower Extremity Lymphedema   At Groin Measure at Horizontal from Pubic Bone 53.7 cm    20 cm Proximal to Suprapatella 51.7 cm    10 cm Proximal to Suprapatella 46.5 cm    At Midpatella/Popliteal Crease 43.5 cm    30 cm Proximal to Floor at Lateral Plantar Foot 40.2 cm    20 cm Proximal to Floor at Lateral Plantar Foot 31.2 cm    10 cm Proximal to Floor at Lateral Malleoli 27.4 cm    5 cm Proximal to 1st MTP Joint 27.3 cm    Across MTP Joint 17.5 cm    Around Proximal Great Toe 10.2 cm                   Objective measurements completed on examination: See above findings.                    PT Long Term Goals - 06/26/20 1238      PT LONG TERM GOAL #1   Title Pt will be able to tolerate compression bandaging for reduction of LE edema    Baseline legs very tender    Time 3    Period Weeks    Status New    Target Date 07/17/20      PT LONG TERM GOAL #2   Title Pt will note decreased bilateral redness and pain following wrapping by atleast 50%    Time 6    Period Weeks    Status New      PT LONG TERM GOAL #3   Title Pt will have decreased edema by 3-4 cm at 30 cm prox to floor bilaterally    Time 6    Period Weeks    Status New    Target Date 08/07/20      PT LONG TERM GOAL #4   Title Pt will be fit for and be able to wear compression garments to  manage edema    Time 8    Period Weeks    Status New    Target Date 08/21/20                  Plan - 06/26/20 1227    Clinical Impression Statement Pt is with stage 4 Lung CA, and also has prostate CA and Mets to brain (having radiation) and spine.  He has bilateral LE swelling due to low Albumin per pt.  He just finished a round of antibiotics however lower legs are very red, shiny and extremely warm and tender.  He is to follow up with Sandi Mealy to see if he requires another round of antibiotic before we can safely treat him.  He would not tolerate  wraps presently.  We discussed that we will start slowly with just a few wraps initially to evaluate his response since we would be pushing alot of fluid.  He verbalized understanding and said his wife would be able to assist with wrapping    Personal Factors and Comorbidities Comorbidity 3+    Comorbidities Lung CA with METS, Prostate Cancer, Cellulitis    Stability/Clinical Decision Making Evolving/Moderate complexity    Clinical Decision Making Moderate    Rehab Potential Good    PT Frequency 3x / week    PT Duration 8 weeks    PT Treatment/Interventions Therapeutic exercise;Patient/family education;Manual techniques;Compression bandaging;Manual lymph drainage;Visual/perceptual remediation/compensation    PT Next Visit Plan Pt to get approval from Sandi Mealy for wrapping secondary to ? about continued cellulitis in Bilateral LE and potential need for more antibiotics..  When allowed to return will start CDP with MLD, and bandaging starting with Just a few wraps initially to assess his response.    Recommended Other Services when ready compression garments/? night time garment,    Consulted and Agree with Plan of Care Patient           Patient will benefit from skilled therapeutic intervention in order to improve the following deficits and impairments:  Pain, Decreased skin integrity, Decreased knowledge of precautions, Increased  edema  Visit Diagnosis: Localized edema  Pain in left lower leg  Pain in right lower leg  Primary lung cancer with metastasis from lung to other site, [facility])     Problem List Patient Active Problem List   Diagnosis Date Noted  . Shortness of breath 03/12/2020  . Bilateral lower extremity edema 02/22/2020  . Port-A-Cath in place 04/25/2019  . Brain metastasis ([place]) 04/07/2019  . Malignant pleural effusion 03/29/2019  . Adenocarcinoma of right lung, stage 4 ([place]) 03/15/2019  . Encounter for antineoplastic chemotherapy 03/15/2019  . Encounter for antineoplastic immunotherapy 03/15/2019  . Goals of care, counseling/discussion 03/15/2019  . Recurrent right pleural effusion 03/15/2019  . Pleural effusion 02/20/2019  . Nonspecific abnormal electrocardiogram (ECG) (EKG) 02/20/2019  . Preop cardiovascular exam 02/20/2019  . Pulmonary nodule, right 02/16/2019  . Chronic cough 02/16/2019  . B12 deficiency 11/24/2018  . Neuropathy, peripheral 11/24/2018  . Migraines 06/14/2018  . Retinal defect, left 06/14/2018  . Borderline hypertension 11/04/2017    Elsie Ra [facility] 06/26/2020, 12:43 PM  [place], [place], [address] Phone: [phone]   Fax:  [phone]  Name: Ethan Brown MRN: [id number] Date of Birth: [birth year]

## 2020-06-26 NOTE — Telephone Encounter (Signed)
Patient is approved for Exkivity at no charge from [place]. 06/26/20-08/23/21.   Bernita Buffy will call patient every month for refill.  [place] [facility] [place] Phone [phone] Fax [phone] 06/26/2020 11:21 AM

## 2020-06-26 NOTE — Patient Instructions (Signed)
Pt advised to make appt with Dr. Satira Sark secondary to therapist concerns that he still has cellulitis and may need a second round of antibiotics.  Don't feel comfortable wrapping legs at this time and he probably wouln't tolerate the wraps because of tenderness in his lower legs.

## 2020-06-27 ENCOUNTER — Encounter: Payer: Self-pay | Admitting: Internal Medicine

## 2020-06-27 ENCOUNTER — Telehealth: Payer: Self-pay | Admitting: Urology

## 2020-06-28 ENCOUNTER — Other Ambulatory Visit: Payer: Self-pay | Admitting: Medical

## 2020-06-28 MED ORDER — DOXYCYCLINE HYCLATE 100 MG PO TABS: 100.0000 mg | ORAL_TABLET | Freq: Two times a day (BID) | ORAL | 0 refills | Status: DC

## 2020-06-28 NOTE — Telephone Encounter (Signed)
Oral Chemotherapy Pharmacist Encounter   Spoke with patient today to follow up regarding patient's oral chemotherapy medication: Exkivity (mobocertinib)  Medication is being delivered to patient's home 06/28/2020 and [name] will start medication on 06/29/2020.   Patient knows to call the office with questions or concerns.  Leron Croak, PharmD, BCPS Hematology/Oncology Clinical Pharmacist [facility] [phone] 06/28/2020 11:52 AM

## 2020-07-02 ENCOUNTER — Other Ambulatory Visit: Payer: Self-pay | Admitting: Internal Medicine

## 2020-07-02 DIAGNOSIS — J189 Pneumonia, unspecified organism: Secondary | ICD-10-CM

## 2020-07-02 DIAGNOSIS — R059 Cough, unspecified: Secondary | ICD-10-CM

## 2020-07-02 DIAGNOSIS — R509 Fever, unspecified: Secondary | ICD-10-CM

## 2020-07-03 ENCOUNTER — Encounter: Payer: Self-pay | Admitting: Radiation Oncology

## 2020-07-03 ENCOUNTER — Ambulatory Visit
Admission: RE | Admit: 2020-07-03 | Discharge: 2020-07-03 | Disposition: A | Discharge status: Home / Self Care | Payer: Medicare Other | Source: Ambulatory Visit | Attending: Radiation Oncology | Admitting: Radiation Oncology

## 2020-07-03 ENCOUNTER — Other Ambulatory Visit: Payer: Self-pay

## 2020-07-03 VITALS — BP 131/78 | HR 84 | Temp 97.7°F | Resp 18

## 2020-07-03 DIAGNOSIS — C342 Malignant neoplasm of middle lobe, bronchus or lung: Secondary | ICD-10-CM | POA: Diagnosis not present

## 2020-07-03 DIAGNOSIS — R351 Nocturia: Secondary | ICD-10-CM | POA: Diagnosis not present

## 2020-07-03 DIAGNOSIS — C61 Malignant neoplasm of prostate: Secondary | ICD-10-CM | POA: Diagnosis not present

## 2020-07-03 DIAGNOSIS — C7931 Secondary malignant neoplasm of brain: Secondary | ICD-10-CM | POA: Diagnosis not present

## 2020-07-03 DIAGNOSIS — C7951 Secondary malignant neoplasm of bone: Secondary | ICD-10-CM | POA: Diagnosis not present

## 2020-07-03 DIAGNOSIS — Z510 Encounter for antineoplastic radiation therapy: Secondary | ICD-10-CM | POA: Diagnosis not present

## 2020-07-03 DIAGNOSIS — Z85118 Personal history of other malignant neoplasm of bronchus and lung: Secondary | ICD-10-CM | POA: Diagnosis not present

## 2020-07-03 NOTE — Progress Notes (Signed)
Ethan Brown rested with [place] for 30 minutes following his SRS treatment.  Patient denies headache, dizziness, nausea, diplopia or ringing in the ears. Denies fatigue. Patient without complaints. Understands to avoid strenuous activity for the next 24 hours and call [phone] with needs.   BP 131/78   Pulse 84   Temp 97.7 F (36.5 C)   Resp 18   SpO2 98%    Ethan Brown M. Leonie Green, BSN

## 2020-07-05 ENCOUNTER — Ambulatory Visit: Payer: Medicare Other

## 2020-07-05 ENCOUNTER — Other Ambulatory Visit: Payer: Self-pay

## 2020-07-05 DIAGNOSIS — M79662 Pain in left lower leg: Secondary | ICD-10-CM | POA: Diagnosis not present

## 2020-07-05 DIAGNOSIS — R600 Localized edema: Secondary | ICD-10-CM

## 2020-07-05 DIAGNOSIS — M79661 Pain in right lower leg: Secondary | ICD-10-CM

## 2020-07-05 DIAGNOSIS — C3491 Malignant neoplasm of unspecified part of right bronchus or lung: Secondary | ICD-10-CM | POA: Diagnosis not present

## 2020-07-05 NOTE — Therapy (Signed)
[place], [place], [address] Phone: [phone]   Fax:  [phone]  Physical Therapy Treatment  Patient Details  Name: Ethan Brown MRN: [id number] Date of Birth: 10-03-1950 Referring Provider (PT): Tanner   Encounter Date: 07/05/2020   PT End of Session - 07/05/20 1222    Visit Number 2    Number of Visits 25    Date for PT Re-Evaluation 08/21/20    PT Start Time 0910    PT Stop Time 1000    PT Time Calculation (min) 50 min    Activity Tolerance Patient tolerated treatment well    Behavior During Therapy [facility] for tasks assessed/performed           Past Medical History:  Diagnosis Date  . Borderline hypertension   . Brain metastasis ([place]) dx'd 01/2019  . Detached vitreous humor, left   . Dyspnea   . ED (erectile dysfunction) of organic origin   . Hyperlipidemia   . Hypertension   . Lens subluxation, left   . Lung cancer ([place]) dx'd 01/2019   Stage IV  . Migraine headache    takes PRN Imitrex  . Pseudophakia   . TGA (transient global amnesia) 2017    Past Surgical History:  Procedure Laterality Date  . CAROTID DOPPLERS  09/2017   Mild non-obstructive disease  . CATARACT EXTRACTION, BILATERAL    . cataract, left  2018  . CHEST TUBE INSERTION Right 03/29/2019   Procedure: INSERTION PLEURAL DRAINAGE CATHETER;  Surgeon: [name], MD;  Location: [place];  Service: Thoracic;  Laterality: Right;  . COLONOSCOPY  2016   clear, repeat in 10 yrs   . Eye surgeries     , Lens attachment.  Vitrectomy  . FEMORAL HERNIA REPAIR    . HERNIA REPAIR    . IR THORACENTESIS ASP PLEURAL SPACE W/IMG GUIDE  02/22/2019  . IR THORACENTESIS ASP PLEURAL SPACE W/IMG GUIDE  03/13/2019  . IR THORACENTESIS ASP PLEURAL SPACE W/IMG GUIDE  03/18/2020  . LUMBAR LAMINECTOMY    . PLEURAL BIOPSY Right 03/29/2019   Procedure: PLEURAL BIOPSY;  Surgeon: [name], MD;  Location: [place];  Service:  Thoracic;  Laterality: Right;  . PORTACATH PLACEMENT N/A 03/29/2019   Procedure: INSERTION PORT-A-CATH;  Surgeon: [name], MD;  Location: [place];  Service: Thoracic;  Laterality: N/A;  . retinal attachment, right    . sclearl buckle, right    . victrectomy,right    . VIDEO ASSISTED THORACOSCOPY Right 03/29/2019   Procedure: VIDEO ASSISTED THORACOSCOPY;  Surgeon: [name], MD;  Location: [place];  Service: Thoracic;  Laterality: Right;    There were no vitals filed for this visit.   Subjective Assessment - 07/05/20 1212    Subjective Pt was placed on a second round of antibiotics and has had much improvement in his legs with significantly decreased redness and tenderness.  The pain after first standing from an elevated position has also lessened.  He is still getting mild swelling in his arms.  He will meet with a nutritionist next week.    Pertinent History Diagnosed February 10, 2019 with Lung CA stage 4.  Had surgery July or August 20/20, Started chemo shortly thereafter.  No longer having keytruda infusions or others because they aren't working.  Takes a tablet now that addresses his marker but it is very expensive/  Has had 2 radiation treatments on his brain and has 1 left.  Also has prostate CA, Had a PET scan a few weeks ago and has METS to his vertebrae.  Will have a multi D meeting next week with oncologist, radiologist, and urologist    Currently in Pain? Yes    Pain Score 3     Pain Location Leg    Pain Orientation Right;Left    Pain Descriptors / Indicators Tender;Tightness;Burning    Pain Onset 1 to 4 weeks ago    Aggravating Factors  dependent position                             [facility] Adult PT Treatment/Exercise - 07/05/20 0001      Manual Therapy   Manual Therapy Edema management;Manual Lymphatic Drainage (MLD)    Edema Management applied large TG soft to bilateral LE's after rx    Manual Lymphatic Drainage (MLD) Short neck, left  inguinal, 5 diaphragmatic breaths, left lateral upper leg, medial to lateral thigh, and lateral thigh proximal to groin, followed by ant and posterior lower leg retracing steps except medial to lateral thigh and then foot retracing steps to groin.  Repeated same steps on right LE with legs mildly elevated.,  Lotion applied to bilateral lower legs before applying TG soft                  PT Education - 07/05/20 1220    Education Details Pt educated in importance of deep breathing exercises to activate deep lymphatics, and elevation.  Pt to watch his legs closely to be sure redness doesn't increase and if it does will contact MD. Advised to remove TG soft if uncomfortable for any reason               PT Long Term Goals - 06/26/20 1238      PT LONG TERM GOAL #1   Title Pt will be able to tolerate compression bandaging for reduction of LE edema    Baseline legs very tender    Time 3    Period Weeks    Status New    Target Date 07/17/20      PT LONG TERM GOAL #2   Title Pt will note decreased bilateral redness and pain following wrapping by atleast 50%    Time 6    Period Weeks    Status New      PT LONG TERM GOAL #3   Title Pt will have decreased edema by 3-4 cm at 30 cm prox to floor bilaterally    Time 6    Period Weeks    Status New    Target Date 08/07/20      PT LONG TERM GOAL #4   Title Pt will be fit for and be able to wear compression garments to manage edema    Time 8    Period Weeks    Status New    Target Date 08/21/20                 Plan - 07/05/20 1222    Clinical Impression Statement Pt with excellent improvement in lower extremity redness and tenderness after 2nd round of antibiotics.  Lower legs still mildy tender, but can likely tolerate gentle compression bandaging next visit.  He would like to bring his wife to learn MLD and wrapping.    Personal Factors and Comorbidities Comorbidity 3+    Comorbidities Lung CA with METS, Prostate  Cancer, Cellulitis now improved  Stability/Clinical Decision Making Evolving/Moderate complexity    Rehab Potential Good    PT Frequency 3x / week    PT Duration 8 weeks    PT Treatment/Interventions Therapeutic exercise;Patient/family education;Manual techniques;Compression bandaging;Manual lymph drainage    PT Next Visit Plan reassess redness/ tenderness of legs/ instruct Wife in MLD if she comes, Consider wrapping 1 leg with just 2 or 3 wraps to assess tolerance and not to push too much fluid initially    PT Home Exercise Plan Diaphragmatic breathing/elevation/TG soft if comfortable    Consulted and Agree with Plan of Care Patient           Patient will benefit from skilled therapeutic intervention in order to improve the following deficits and impairments:  Pain, Decreased skin integrity, Decreased knowledge of precautions, Increased edema  Visit Diagnosis: Localized edema  Pain in left lower leg  Pain in right lower leg  Primary lung cancer with metastasis from lung to other site, right [facility])     Problem List Patient Active Problem List   Diagnosis Date Noted  . Shortness of breath 03/12/2020  . Bilateral lower extremity edema 02/22/2020  . Port-A-Cath in place 04/25/2019  . Brain metastasis ([place]) 04/07/2019  . Malignant pleural effusion 03/29/2019  . Adenocarcinoma of right lung, stage 4 ([place]) 03/15/2019  . Encounter for antineoplastic chemotherapy 03/15/2019  . Encounter for antineoplastic immunotherapy 03/15/2019  . Goals of care, counseling/discussion 03/15/2019  . Recurrent right pleural effusion 03/15/2019  . Pleural effusion 02/20/2019  . Nonspecific abnormal electrocardiogram (ECG) (EKG) 02/20/2019  . Preop cardiovascular exam 02/20/2019  . Pulmonary nodule, right 02/16/2019  . Chronic cough 02/16/2019  . B12 deficiency 11/24/2018  . Neuropathy, peripheral 11/24/2018  . Migraines 06/14/2018  . Retinal defect, left 06/14/2018  . Borderline hypertension  11/04/2017    Claris Pong, PT 07/05/2020, 12:27 PM  [place], [place], [address] Phone: [phone]   Fax:  [phone]  Name: Shamari Lofquist MRN: [id number] Date of Birth: [birth year]

## 2020-07-06 ENCOUNTER — Other Ambulatory Visit: Payer: Self-pay | Admitting: Medical

## 2020-07-06 DIAGNOSIS — R600 Localized edema: Secondary | ICD-10-CM

## 2020-07-07 NOTE — Progress Notes (Signed)
  Radiation Oncology         [phone] ________________________________  Stereotactic Treatment Procedure Note  Name: Ethan Brown MRN: [id number]  Date: 07/03/2020  DOB: 20-Sep-1950  SPECIAL TREATMENT PROCEDURE    ICD-10-CM   1. Brain metastasis ([place])  C79.31     3D TREATMENT PLANNING AND DOSIMETRY:  The patient's radiation plan was reviewed and approved by neurosurgery and radiation oncology prior to treatment.  It showed 3-dimensional radiation distributions overlaid onto the planning CT/MRI image set.  The [facility] for the target structures as well as the organs at risk were reviewed. The documentation of the 3D plan and dosimetry are filed in the radiation oncology EMR.  NARRATIVE:  Ethan Brown was brought to the TrueBeam stereotactic radiation treatment machine and placed supine on the CT couch. The head frame was applied, and the patient was set up for stereotactic radiosurgery.  Neurosurgery was present for the set-up and delivery  SIMULATION VERIFICATION:  In the couch zero-angle position, the patient underwent Exactrac imaging using the Brainlab system with orthogonal KV images.  These were carefully aligned and repeated to confirm treatment position for each of the isocenters.  The Exactrac snap film verification was repeated at each couch angle.  PROCEDURE: Ethan Brown received stereotactic radiosurgery to the following targets: Left periventricular 2 mm target was treated using 3 Dynamic Conformal Arcs to a prescription dose of 20 Gy.  ExacTrac registration was performed for each couch angle.  The 100% isodose line was prescribed with hotspot 113.5%.  6 MV X-rays were delivered in the flattening filter free beam mode. Right temporal 5 mm target was treated using 3 Dynamic Conformal Arcs to a prescription dose of 20 Gy.  ExacTrac registration was performed for each couch angle.  The 100% isodose line was prescribed with hotspot of 134.7%.  6 MV X-rays were  delivered in the flattening filter free beam mode.  STEREOTACTIC TREATMENT MANAGEMENT:  Following delivery, the patient was transported to nursing in stable condition and monitored for possible acute effects.  Vital signs were recorded BP 131/78   Pulse 84   Temp 97.7 F (36.5 C)   Resp 18   SpO2 98% . The patient tolerated treatment without significant acute effects, and was discharged to home in stable condition.    PLAN: Follow-up in one month.  ________________________________  Sheral Apley. Tammi Klippel, M.D.

## 2020-07-09 ENCOUNTER — Ambulatory Visit: Payer: Medicare Other

## 2020-07-09 ENCOUNTER — Other Ambulatory Visit: Payer: Self-pay

## 2020-07-09 DIAGNOSIS — C3491 Malignant neoplasm of unspecified part of right bronchus or lung: Secondary | ICD-10-CM

## 2020-07-09 DIAGNOSIS — M79662 Pain in left lower leg: Secondary | ICD-10-CM | POA: Diagnosis not present

## 2020-07-09 DIAGNOSIS — M79661 Pain in right lower leg: Secondary | ICD-10-CM

## 2020-07-09 DIAGNOSIS — R600 Localized edema: Secondary | ICD-10-CM | POA: Diagnosis not present

## 2020-07-09 NOTE — Therapy (Signed)
[place], [place], [address] Phone: [phone]   Fax:  [phone]  Physical Therapy Treatment  Patient Details  Name: Ethan Brown MRN: [id number] Date of Birth: [birth year] Referring Provider (PT): Tanner   Encounter Date: 07/09/2020   PT End of Session - 07/09/20 1103    Visit Number 3    Number of Visits 25    Date for PT Re-Evaluation 08/21/20    PT Start Time 0805    PT Stop Time 0901    PT Time Calculation (min) 56 min    Activity Tolerance Patient tolerated treatment well    Behavior During Therapy [facility] for tasks assessed/performed           Past Medical History:  Diagnosis Date  . Borderline hypertension   . Brain metastasis ([place]) dx'd 01/2019  . Detached vitreous humor, left   . Dyspnea   . ED (erectile dysfunction) of organic origin   . Hyperlipidemia   . Hypertension   . Lens subluxation, left   . Lung cancer ([place]) dx'd 01/2019   Stage IV  . Migraine headache    takes PRN Imitrex  . Pseudophakia   . TGA (transient global amnesia) 2017    Past Surgical History:  Procedure Laterality Date  . CAROTID DOPPLERS  09/2017   Mild non-obstructive disease  . CATARACT EXTRACTION, BILATERAL    . cataract, left  2018  . CHEST TUBE INSERTION Right 03/29/2019   Procedure: INSERTION PLEURAL DRAINAGE CATHETER;  Surgeon: [name], MD;  Location: [place];  Service: Thoracic;  Laterality: Right;  . COLONOSCOPY  2016   clear, repeat in 10 yrs   . Eye surgeries     , Lens attachment.  Vitrectomy  . FEMORAL HERNIA REPAIR    . HERNIA REPAIR    . IR THORACENTESIS ASP PLEURAL SPACE W/IMG GUIDE  02/22/2019  . IR THORACENTESIS ASP PLEURAL SPACE W/IMG GUIDE  03/13/2019  . IR THORACENTESIS ASP PLEURAL SPACE W/IMG GUIDE  03/18/2020  . LUMBAR LAMINECTOMY    . PLEURAL BIOPSY Right 03/29/2019   Procedure: PLEURAL BIOPSY;  Surgeon: [name], MD;  Location: [place];  Service:  Thoracic;  Laterality: Right;  . PORTACATH PLACEMENT N/A 03/29/2019   Procedure: INSERTION PORT-A-CATH;  Surgeon: [name], MD;  Location: [place];  Service: Thoracic;  Laterality: N/A;  . retinal attachment, right    . sclearl buckle, right    . victrectomy,right    . VIDEO ASSISTED THORACOSCOPY Right 03/29/2019   Procedure: VIDEO ASSISTED THORACOSCOPY;  Surgeon: [name], MD;  Location: [place];  Service: Thoracic;  Laterality: Right;    There were no vitals filed for this visit.   Subjective Assessment - 07/09/20 0805    Subjective I have been wearing the TG soft during the day but not at night.  When I wake up in the am my legs are way down but by the end of the day they are very swollen again.  Swelling pools above the TG soft.    Pertinent History Diagnosed February 10, 2019 with Lung CA stage 4.  Had surgery July or August 20/20, Started chemo shortly thereafter.  No longer having keytruda infusions or others because they aren't working.  Takes a tablet now that addresses his marker but it is very expensive/  Has had 2 radiation treatments on his brain and has 1 left.  Also has prostate CA, Had a PET scan a  few weeks ago and has METS to his vertebrae.  Will have a multi D meeting next week with oncologist, radiologist, and urologist    Currently in Pain? Yes    Pain Score 4    when first getting up from reclined position   Pain Orientation Right;Left    Pain Descriptors / Indicators Burning;Tightness;Tender    Pain Onset 1 to 4 weeks ago                             [facility] Adult PT Treatment/Exercise - 07/09/20 0001      Manual Therapy   Edema Management applied large TG soft to bilateral LE's after rx. Pt to not fold over the top and put on bike shorts when he gets home.  Wrapped left LE only wiith artiflex to thigh, elastomull to toes 1-4, 8 cm to foot and ankle and 12 cm to knee, 2nd 12 cm to thigh, with 1/2 in gray foam at top to prevent sliding  down.    Manual Lymphatic Drainage (MLD) Short neck, left inguinal, 5 diaphragmatic breaths, left lateral upper leg, medial to lateral thigh, and lateral thigh proximal to groin, followed by ant and posterior lower leg retracing steps except medial to lateral thigh and then foot retracing steps to groin.  Repeated same steps on right LE with legs mildly elevated.,  Coconut oil applied to bilateral lower legs before applying TG soft                  PT Education - 07/09/20 1102    Education Details Pt advised to remove bandages if he has any discomfort or SOB.  If it slides down suggested he try to rewrap or have wife wrap if possible. Only wrapped 1 leg to check his response    Person(s) Educated Patient    Comprehension Verbalized understanding               PT Long Term Goals - 06/26/20 1238      PT LONG TERM GOAL #1   Title Pt will be able to tolerate compression bandaging for reduction of LE edema    Baseline legs very tender    Time 3    Period Weeks    Status New    Target Date 07/17/20      PT LONG TERM GOAL #2   Title Pt will note decreased bilateral redness and pain following wrapping by atleast 50%    Time 6    Period Weeks    Status New      PT LONG TERM GOAL #3   Title Pt will have decreased edema by 3-4 cm at 30 cm prox to floor bilaterally    Time 6    Period Weeks    Status New    Target Date 08/07/20      PT LONG TERM GOAL #4   Title Pt will be fit for and be able to wear compression garments to manage edema    Time 8    Period Weeks    Status New    Target Date 08/21/20                 Plan - 07/09/20 1104    Clinical Impression Statement Therapy consisted of MLD to bilateral inguinal nodes and LE, and wrapping of left LE toes and 8 cm and 2 - 12 cm wraps only to assess response to wraps and  not to push too much fluid.  Redness in legs greatly improved today.    Personal Factors and Comorbidities Comorbidity 3+    Comorbidities Lung  CA with METS, Prostate Cancer, Cellulitis now improved    Stability/Clinical Decision Making Evolving/Moderate complexity    Clinical Decision Making Moderate    Rehab Potential Good    PT Frequency 3x / week    PT Duration 8 weeks    PT Treatment/Interventions Therapeutic exercise;Patient/family education;Manual techniques;Compression bandaging;Manual lymph drainage    PT Next Visit Plan continue MLD to groin LN only, assess wrapping for SOB, benefit, rewrap.  Use bike shorts to hold up wraps    Consulted and Agree with Plan of Care Patient           Patient will benefit from skilled therapeutic intervention in order to improve the following deficits and impairments:  Pain, Decreased skin integrity, Decreased knowledge of precautions, Increased edema  Visit Diagnosis: Pain in left lower leg  Pain in right lower leg  Primary lung cancer with metastasis from lung to other site, right [facility])  Localized edema     Problem List Patient Active Problem List   Diagnosis Date Noted  . Shortness of breath 03/12/2020  . Bilateral lower extremity edema 02/22/2020  . Port-A-Cath in place 04/25/2019  . Brain metastasis ([place]) 04/07/2019  . Malignant pleural effusion 03/29/2019  . Adenocarcinoma of right lung, stage 4 ([place]) 03/15/2019  . Encounter for antineoplastic chemotherapy 03/15/2019  . Encounter for antineoplastic immunotherapy 03/15/2019  . Goals of care, counseling/discussion 03/15/2019  . Recurrent right pleural effusion 03/15/2019  . Pleural effusion 02/20/2019  . Nonspecific abnormal electrocardiogram (ECG) (EKG) 02/20/2019  . Preop cardiovascular exam 02/20/2019  . Pulmonary nodule, right 02/16/2019  . Chronic cough 02/16/2019  . B12 deficiency 11/24/2018  . Neuropathy, peripheral 11/24/2018  . Migraines 06/14/2018  . Retinal defect, left 06/14/2018  . Borderline hypertension 11/04/2017    Claris Pong, PT 07/09/2020, 11:07 AM  [place], [place], [address] Phone: [phone]   Fax:  [phone]  Name: Deidrick Rainey MRN: [id number] Date of Birth: Dec 17, 1950

## 2020-07-09 NOTE — Telephone Encounter (Signed)
Cassie-for your review to refill. Gardiner Rhyme, RN

## 2020-07-10 ENCOUNTER — Ambulatory Visit: Payer: Medicare Other

## 2020-07-10 DIAGNOSIS — C3491 Malignant neoplasm of unspecified part of right bronchus or lung: Secondary | ICD-10-CM

## 2020-07-10 DIAGNOSIS — M79662 Pain in left lower leg: Secondary | ICD-10-CM

## 2020-07-10 DIAGNOSIS — R600 Localized edema: Secondary | ICD-10-CM | POA: Diagnosis not present

## 2020-07-10 DIAGNOSIS — M79661 Pain in right lower leg: Secondary | ICD-10-CM

## 2020-07-10 NOTE — Therapy (Signed)
[facility] [place], [place], [address] Phone: [phone]   Fax:  [phone]  Physical Therapy Treatment  Patient Details  Name: Ethan Brown MRN: [id number] Date of Birth: 05-31-1951 Referring Provider (PT): Tanner   Encounter Date: 07/10/2020   PT End of Session - 07/10/20 1420    Visit Number 4    Number of Visits 25    Date for PT Re-Evaluation 08/21/20    PT Start Time 0905    PT Stop Time 1015    PT Time Calculation (min) 70 min    Activity Tolerance Patient tolerated treatment well    Behavior During Therapy [facility] for tasks assessed/performed           Past Medical History:  Diagnosis Date  . Borderline hypertension   . Brain metastasis ([place]) dx'd 01/2019  . Detached vitreous humor, left   . Dyspnea   . ED (erectile dysfunction) of organic origin   . Hyperlipidemia   . Hypertension   . Lens subluxation, left   . Lung cancer ([place]) dx'd 01/2019   Stage IV  . Migraine headache    takes PRN Imitrex  . Pseudophakia   . TGA (transient global amnesia) 2017    Past Surgical History:  Procedure Laterality Date  . CAROTID DOPPLERS  09/2017   Mild non-obstructive disease  . CATARACT EXTRACTION, BILATERAL    . cataract, left  2018  . CHEST TUBE INSERTION Right 03/29/2019   Procedure: INSERTION PLEURAL DRAINAGE CATHETER;  Surgeon: [name], MD;  Location: [place];  Service: Thoracic;  Laterality: Right;  . COLONOSCOPY  2016   clear, repeat in 10 yrs   . Eye surgeries     , Lens attachment.  Vitrectomy  . FEMORAL HERNIA REPAIR    . HERNIA REPAIR    . IR THORACENTESIS ASP PLEURAL SPACE W/IMG GUIDE  02/22/2019  . IR THORACENTESIS ASP PLEURAL SPACE W/IMG GUIDE  03/13/2019  . IR THORACENTESIS ASP PLEURAL SPACE W/IMG GUIDE  03/18/2020  . LUMBAR LAMINECTOMY    . PLEURAL BIOPSY Right 03/29/2019   Procedure: PLEURAL BIOPSY;  Surgeon: [name], MD;  Location: [place];  Service:  Thoracic;  Laterality: Right;  . PORTACATH PLACEMENT N/A 03/29/2019   Procedure: INSERTION PORT-A-CATH;  Surgeon: [name], MD;  Location: [place];  Service: Thoracic;  Laterality: N/A;  . retinal attachment, right    . sclearl buckle, right    . victrectomy,right    . VIDEO ASSISTED THORACOSCOPY Right 03/29/2019   Procedure: VIDEO ASSISTED THORACOSCOPY;  Surgeon: [name], MD;  Location: [facility];  Service: Thoracic;  Laterality: Right;    There were no vitals filed for this visit.   Subjective Assessment - 07/10/20 1008    Subjective The wrap that you put on yesterday is still intact.  I can't find my bike shorts.  Able to sleep in wrap without any problem and no SOB    Pertinent History Diagnosed February 10, 2019 with Lung CA stage 4.  Had surgery July or August 20/20, Started chemo shortly thereafter.  No longer having keytruda infusions or others because they aren't working.  Takes a tablet now that addresses his marker but it is very expensive/  Has had 2 radiation treatments on his brain and has 1 left.  Also has prostate CA, Had a PET scan a few weeks ago and has METS to his vertebrae.  Will have a multi D meeting next  week with oncologist, radiologist, and urologist    Patient Stated Goals Decrease swelling in legs                             [facility] Adult PT Treatment/Exercise - 07/10/20 0001      Manual Therapy   Edema Management applied large TG soft to bilateral LE's after rx. Pt to not fold over the top and put on exercise shorts when he gets home.  Wrapped bilateral  LE wiith artiflex to thigh, elastomull to toes 1-4, 8 cm to foot and ankle and 12 cm to knee, 2 12 cm to thigh, with 1/2 in gray foam at top to prevent sliding down.    Manual Lymphatic Drainage (MLD) Short neck, left inguinal, 5 diaphragmatic breaths, left lateral upper leg, medial to lateral thigh, and lateral thigh proximal to groin, followed by ant and posterior lower leg retracing  steps except medial to lateral thigh and then foot retracing steps to groin.                   PT Education - 07/10/20 1419    Education Details Pt again reminded to remove wraps if they slide down or if he experiences any SOB.    Person(s) Educated Patient    Methods Explanation;Verbal cues    Comprehension Verbalized understanding               PT Long Term Goals - 06/26/20 1238      PT LONG TERM GOAL #1   Title Pt will be able to tolerate compression bandaging for reduction of LE edema    Baseline legs very tender    Time 3    Period Weeks    Status New    Target Date 07/17/20      PT LONG TERM GOAL #2   Title Pt will note decreased bilateral redness and pain following wrapping by atleast 50%    Time 6    Period Weeks    Status New      PT LONG TERM GOAL #3   Title Pt will have decreased edema by 3-4 cm at 30 cm prox to floor bilaterally    Time 6    Period Weeks    Status New    Target Date 08/07/20      PT LONG TERM GOAL #4   Title Pt will be fit for and be able to wear compression garments to manage edema    Time 8    Period Weeks    Status New    Target Date 08/21/20                 Plan - 07/10/20 1421    Clinical Impression Statement pt had excellent edema reduction in left LE when wraps from yesterday were removed this am.  Pt had no SOB with wraps and therefor we wrapped both legs today.  Pt was advised again to remove wraps if they slide, become uncomfortable or if he develops SOB.    Personal Factors and Comorbidities Comorbidity 3+    Comorbidities Lung CA with METS, Prostate Cancer, Cellulitis now improved    Stability/Clinical Decision Making Evolving/Moderate complexity    Clinical Decision Making Moderate    Rehab Potential Good    PT Frequency 3x / week    PT Duration 8 weeks    PT Treatment/Interventions Therapeutic exercise;Patient/family education;Manual techniques;Compression bandaging;Manual lymph drainage  PT Next  Visit Plan continue MLD to groin LN only, assess wrapping for SOB, benefit, rewrap.  Buy compression shorts to hold up wraps    PT Home Exercise Plan Diaphragmatic breathing/elevation/TG soft if comfortable    Consulted and Agree with Plan of Care Patient           Patient will benefit from skilled therapeutic intervention in order to improve the following deficits and impairments:  Pain, Decreased skin integrity, Decreased knowledge of precautions, Increased edema  Visit Diagnosis: Localized edema  Primary lung cancer with metastasis from lung to other site, right (HCC)  Pain in right lower leg  Pain in left lower leg     Problem List Patient Active Problem List   Diagnosis Date Noted  . Shortness of breath 03/12/2020  . Bilateral lower extremity edema 02/22/2020  . Port-A-Cath in place 04/25/2019  . Brain metastasis ([place]) 04/07/2019  . Malignant pleural effusion 03/29/2019  . Adenocarcinoma of right lung, stage 4 ([place]) 03/15/2019  . Encounter for antineoplastic chemotherapy 03/15/2019  . Encounter for antineoplastic immunotherapy 03/15/2019  . Goals of care, counseling/discussion 03/15/2019  . Recurrent right pleural effusion 03/15/2019  . Pleural effusion 02/20/2019  . Nonspecific abnormal electrocardiogram (ECG) (EKG) 02/20/2019  . Preop cardiovascular exam 02/20/2019  . Pulmonary nodule, right 02/16/2019  . Chronic cough 02/16/2019  . B12 deficiency 11/24/2018  . Neuropathy, peripheral 11/24/2018  . Migraines 06/14/2018  . Retinal defect, left 06/14/2018  . Borderline hypertension 11/04/2017    Elsie Ra Bentley Fissel 07/10/2020, 2:26 PM  [facility] [place], [place], [address] Phone: [phone]   Fax:  [phone]  Name: Ethan Brown MRN: [id number] Date of Birth: 09-Jan-1951  Cheral Almas, PT 07/10/20 2:28 PM

## 2020-07-11 ENCOUNTER — Other Ambulatory Visit: Payer: Self-pay | Admitting: Physician Assistant

## 2020-07-11 DIAGNOSIS — C3491 Malignant neoplasm of unspecified part of right bronchus or lung: Secondary | ICD-10-CM

## 2020-07-11 NOTE — Progress Notes (Signed)
[facility] OFFICE PROGRESS NOTE  Laurey Morale, MD [place] [address]  DIAGNOSIS: Stage IV (T2b, N2, M1c)presented with right middle lobe lung mass in addition to mediastinal lymphadenopathy as well as malignant right pleural effusion with pleural-based nodules and suspicious right hepatic lesion and the brain metastasis diagnosed in July 2020.  Molecular Biomarkers: [id number] (Exon 20 insertion) 0.2% Dacomitinib,Neratinib,Osimertinib  [id number] 0.1% None           PRIOR THERAPY:  1)SRS treatment to the metastatic disease to the brain under the care of Dr. Tammi Klippel. Completed on 04/07/2019. 2) SRS to the suspicious small brain lesion in the thalamocapsular region 01/23/2020 under the care of Dr. Tammi Klippel 3) Systemic chemotherapy with carboplatin for AUC of 5, Alimta 500 mg/M2 and Keytruda 200 mg IV every 3 weeks. First dose April 04, 2019.Status post21cycles.Starting from cycle #5 he has been onmaintenanceAlimta and Keytruda every 3 weeks. This was discontinued due to evidence for disease progression.  4) SRS to the metastatic brain lesions under the care of Dr. Tammi Klippel on 07/03/20  CURRENT THERAPY: Monocertinib (Axkibity) 825 mg p.o. daily.  First dose on 06/29/20  INTERVAL HISTORY: Pritesh Sobecki 69 y.o. male returns to the clinic today for a follow up visit accompanied by his wife. The patient recently had evidence for disease progression and his treatment was subsequently switched to oral chemotherapy with monocertinib. He started this on 06/29/20. He has been tolerating this fair except for a drug induced skin rash, dry mouth, and intermittent diarrhea. The rash is primarily on his face. He has dry skin and erythema on his face/cheeks. He also has a rash on the dorsal aspect of his hands bilaterally. He also has dry mouth for which he has been using biotene.  He has mild intermittent diarrhea 1-2x per week. He uses imodium with  fairly good control of his symptoms.   He has been seeing the lymphedema clinic for his swelling. He also has low albumin and is currently trying to increase his protein intake. He is scheduled to meet with a member of the nutritionist team later today.   He also recently had SRS to the metastatic brain lesions. He had this on 07/03/20. He tolerated this well.   He is also following urology for his elevated PSA.    He denies fevers, chills, night sweats, or weight loss. His cough is significantly improved compared to prior. His shortness of breath is "a little" better as well. He denies chest pain or hemoptysis.  He has a prescription for Tussionex as well as guaifenesin/codeine. He denies nausea or vomiting. He is here for evaluation and repeat blood work.   MEDICAL HISTORY: Past Medical History:  Diagnosis Date  . Borderline hypertension   . Brain metastasis ([facility]) dx'd 01/2019  . Detached vitreous humor, left   . Dyspnea   . ED (erectile dysfunction) of organic origin   . Hyperlipidemia   . Hypertension   . Lens subluxation, left   . Lung cancer ([facility]) dx'd 01/2019   Stage IV  . Migraine headache    takes PRN Imitrex  . Pseudophakia   . TGA (transient global amnesia) 2017    ALLERGIES:  has No Known Allergies.  MEDICATIONS:  Current Outpatient Medications  Medication Sig Dispense Refill  . chlorpheniramine-HYDROcodone (TUSSIONEX) 10-8 MG/5ML SUER Take 5 mLs by mouth at bedtime as needed for cough. 140 mL 0  . cyanocobalamin (,VITAMIN B-12,) 1000 MCG/ML injection INJECT 1 ML (1,000  MCG TOTAL) INTO THE MUSCLE ONCE FOR 1 DOSE. 5 mL 1  . hydrochlorothiazide (MICROZIDE) 12.5 MG capsule Take 1 capsule (12.5 mg total) by mouth 2 (two) times daily. 180 capsule 2  . lidocaine-prilocaine (EMLA) cream Apply to the Port-A-Cath site 30-60 minutes before treatment 30 g 0  . Mobocertinib Succinate (EXKIVITY) 40 MG CAPS Take 160 mg by mouth every morning. 120 capsule 3  . Multiple Vitamin  (MULTIVITAMIN WITH MINERALS) TABS tablet Take 1 tablet by mouth daily.    . prochlorperazine (COMPAZINE) 10 MG tablet Take 1 tablet (10 mg total) by mouth every 6 (six) hours as needed for nausea or vomiting. 30 tablet 1  . sildenafil (REVATIO) 20 MG tablet TAKE AS DIRECTED 10 tablet 11  . tamsulosin (FLOMAX) 0.4 MG CAPS capsule Take 0.4 mg by mouth daily.    Marland Kitchen guaiFENesin-codeine 100-10 MG/5ML syrup Take 10 mLs by mouth every 4 (four) hours as needed for cough. (Patient not taking: Reported on 07/15/2020) 240 mL 0  . hydrocortisone 1 % lotion Apply 1 application topically 2 (two) times daily. 113 g 0  . LORazepam (ATIVAN) 1 MG tablet Take 1 tablet (1 mg total) by mouth as needed for anxiety (30 minutes prior to radiation treatment and may repeat just prior to procedure if needed.). (Patient not taking: Reported on 07/15/2020) 10 tablet 0   No current facility-administered medications for this visit.    SURGICAL HISTORY:  Past Surgical History:  Procedure Laterality Date  . CAROTID DOPPLERS  09/2017   Mild non-obstructive disease  . CATARACT EXTRACTION, BILATERAL    . cataract, left  2018  . CHEST TUBE INSERTION Right 03/29/2019   Procedure: INSERTION PLEURAL DRAINAGE CATHETER;  Surgeon: [name], MD;  Location: [facility];  Service: Thoracic;  Laterality: Right;  . COLONOSCOPY  2016   clear, repeat in 10 yrs   . Eye surgeries     , Lens attachment.  Vitrectomy  . FEMORAL HERNIA REPAIR    . HERNIA REPAIR    . IR THORACENTESIS ASP PLEURAL SPACE W/IMG GUIDE  02/22/2019  . IR THORACENTESIS ASP PLEURAL SPACE W/IMG GUIDE  03/13/2019  . IR THORACENTESIS ASP PLEURAL SPACE W/IMG GUIDE  03/18/2020  . LUMBAR LAMINECTOMY    . PLEURAL BIOPSY Right 03/29/2019   Procedure: PLEURAL BIOPSY;  Surgeon: [name], MD;  Location: [facility];  Service: Thoracic;  Laterality: Right;  . PORTACATH PLACEMENT N/A 03/29/2019   Procedure: INSERTION PORT-A-CATH;  Surgeon: [name], MD;   Location: [facility];  Service: Thoracic;  Laterality: N/A;  . retinal attachment, right    . sclearl buckle, right    . victrectomy,right    . VIDEO ASSISTED THORACOSCOPY Right 03/29/2019   Procedure: VIDEO ASSISTED THORACOSCOPY;  Surgeon: [name], MD;  Location[facility] OR;  Service: Thoracic;  Laterality: Right;    REVIEW OF SYSTEMS:   Review of Systems  Constitutional: Negative for appetite change, chills, fatigue, fever and unexpected weight change.  HENT: Negative for mouth sores, nosebleeds, sore throat and trouble swallowing.   Eyes: Negative for eye problems and icterus.  Respiratory: Positive for shortness of breath with exertion. Negative for cough, hemoptysis, and wheezing.   Cardiovascular: Positive for leg swelling (improved). Negative for chest pain. Gastrointestinal: Positive for intermittent diarrhea. Negative for abdominal pain, constipation,  nausea and vomiting.  Genitourinary: Negative for bladder incontinence, difficulty urinating, dysuria, frequency and hematuria.   Musculoskeletal: Negative for back pain, gait problem, neck pain and neck  stiffness.  Skin: Positive for dry skin and rash on face and dorsal aspect of his hands.  Neurological: Negative for dizziness, extremity weakness, gait problem, headaches, light-headedness and seizures.  Hematological: Negative for adenopathy. Does not bruise/bleed easily.  Psychiatric/Behavioral: Negative for confusion, depression and sleep disturbance. The patient is not nervous/anxious.     PHYSICAL EXAMINATION:  Blood pressure 139/87, pulse 78, temperature 98.4 F (36.9 C), temperature source Tympanic, resp. rate 12, height _0  (1.803 m), weight 204 lb 6.4 oz (92.7 kg), SpO2 99 %.  ECOG PERFORMANCE STATUS: 1 - Symptomatic but completely ambulatory  Physical Exam  Constitutional: Oriented to person, place, and time and well-developed, well-nourished, and in no distress.  HENT:  Head: Normocephalic and atraumatic.   Mouth/Throat: Oropharynx is clear and moist. No oropharyngeal exudate.  Eyes: Conjunctivae are normal. Right eye exhibits no discharge. Left eye exhibits no discharge. No scleral icterus.  Neck: Normal range of motion. Neck supple.  Cardiovascular: Normal rate, regular rhythm, normal heart sounds and intact distal pulses.   Pulmonary/Chest: Effort normal and breath sounds normal. No respiratory distress. No wheezes. No rales.  Abdominal: Soft. Bowel sounds are normal. Exhibits no distension and no mass. There is no tenderness.  Musculoskeletal: Normal range of motion. His legs were wrapped with compression bandages.  Lymphadenopathy:    No cervical adenopathy.  Neurological: Alert and oriented to person, place, and time. Exhibits normal muscle tone. Gait normal. Coordination normal.  Skin: Skin is warm and dry. Cheeks dry and erythematous. Not diaphoretic. Rash on dorsal aspect of the hand as well. No pallor.  Psychiatric: Mood, memory and judgment normal.  Vitals reviewed.  LABORATORY DATA: Lab Results  Component Value Date   WBC 5.1 07/15/2020   HGB 9.8 (L) 07/15/2020   HCT 30.7 (L) 07/15/2020   MCV 96.5 07/15/2020   PLT 239 07/15/2020      Chemistry      Component Value Date/Time   NA 139 07/15/2020 1135   K 4.0 07/15/2020 1135   CL 110 07/15/2020 1135   CO2 23 07/15/2020 1135   BUN 26 (H) 07/15/2020 1135   CREATININE 1.09 07/15/2020 1135      Component Value Date/Time   CALCIUM 8.6 (L) 07/15/2020 1135   ALKPHOS 72 07/15/2020 1135   AST 24 07/15/2020 1135   ALT 16 07/15/2020 1135   BILITOT <0.2 (L) 07/15/2020 1135       RADIOGRAPHIC STUDIES:  MR Brain W Wo Contrast  Result Date: 06/20/2020 CLINICAL DATA:  Metastatic lung cancer post radiation EXAM: MRI HEAD WITHOUT AND WITH CONTRAST TECHNIQUE: Multiplanar, multiecho pulse sequences of the brain and surrounding structures were obtained without and with intravenous contrast. CONTRAST:  24m MULTIHANCE GADOBENATE  DIMEGLUMINE 529 MG/ML IV SOLN COMPARISON:  01/04/2020 FINDINGS: Brain: New 5 mm enhancing lesion of the right superior temporal gyrus (series 11, image 74). No associated edema. New 2 mm focus enhancement in the periventricular white matter adjacent the body of the left lateral ventricle (series 11, image 98) Previously seen 2 mm focus of enhancement in the right thalamocapsular region has resolved. There is now a punctate focus susceptibility in this region likely reflecting chronic blood products or mineralization. Probable subtle residual enhancement along the ventral aspect of the right postcentral gyrus (series 11, image 112). Corresponding susceptibility reflecting chronic blood products or mineralization again seen. There is no acute infarction or intracranial hemorrhage. There is no hydrocephalus or extra-axial fluid collection. Ventricles and sulci are stable in size and configuration. Few  small foci of T2 hyperintensity are again identified in the supratentorial white matter likely reflecting stable microvascular ischemic changes. No abnormal enhancement. Vascular: Major vessel flow voids at the skull base are preserved. Skull and upper cervical spine: Normal marrow signal is preserved. Sinuses/Orbits: Mild mucosal thickening.  Orbits are unremarkable. Other: Sella is unremarkable. Minor left mastoid fluid opacification. IMPRESSION: New 5 mm right temporal lesion suspicious for metastasis. New 2 mm focus of enhancement in the left periventricular white matter, which also could reflect a metastasis. Resolution of previous right thalamocapsular region enhancement. Stable treated right parietal metastasis. Electronically Signed   By: Macy Mis M.D.   On: 06/20/2020 16:16   NM PET Image Restag (PS) Skull Base To Thigh  Addendum Date: 06/24/2020   ADDENDUM REPORT: 06/24/2020 08:10 ADDENDUM: As stated in the original report, comparison was made with CT chest abdomen pelvis 06/03/2020 and PET 03/27/2019.  In the very short 2-week interval from 06/03/2020, findings in the chest are unchanged. Direct comparison with 03/27/2019 is of limited utility, given multiple interval CT exams. Electronically Signed   By: Lorin Picket M.D.   On: 06/24/2020 08:10   Result Date: 06/24/2020 CLINICAL DATA:  Subsequent treatment strategy for lung cancer. EXAM: NUCLEAR MEDICINE PET SKULL BASE TO THIGH TECHNIQUE: 9.5 mCi F-18 FDG was injected intravenously. Full-ring PET imaging was performed from the skull base to thigh after the radiotracer. CT data was obtained and used for attenuation correction and anatomic localization. Fasting blood glucose: 95 mg/dl COMPARISON:  CT chest abdomen pelvis 06/03/2020 and PET 03/27/2019. FINDINGS: Mediastinal blood pool activity: SUV max 2.2 Liver activity: SUV max NA NECK: No hypermetabolic lymph nodes. Incidental CT findings: None. CHEST: 5 mm right supraclavicular lymph node (9/76) is hypermetabolic, SUV max 3.7. 8 mm low right internal jugular lymph node (4/55) has an SUV max of 5.2. Hypermetabolic 5 mm low left paratracheal lymph node (4/78), SUV max 3.3. Hypermetabolic subcarinal lymph nodes measure up to 8 mm (4/84), SUV max 10.2. Hypermetabolic right hilar lymph nodes have an SUV max of 5.1. No axillary adenopathy. Residual hypermetabolic pleuroparenchymal thickening in the lower right hemithorax (SUV max 9.2, 4/108). Incidental CT findings: Right IJ Port-A-Cath terminates in the low SVC. Heart is at the upper limits of normal in size. No pericardial effusion. Loculated moderate right pleural effusion in the lower right hemithorax with associated pleural thickening and nodularity. Small left pleural effusion, stable. Septal thickening in the lower right hemithorax. ABDOMEN/PELVIS: No abnormal hypermetabolism in the liver, adrenal glands, spleen or pancreas. Small hypermetabolic abdominal retroperitoneal lymph nodes are seen with an index aortocaval lymph node measuring 6 mm (4/153), SUV  max 4.0. Incidental CT findings: Liver, gallbladder, adrenal glands, kidneys, spleen, pancreas, stomach and bowel are grossly unremarkable. Haziness and slight nodularity in the small bowel mesentery, unchanged. Slight bladder wall thickening with a left lateral diverticulum. Prostate is enlarged. SKELETON: There are new hypermetabolic sclerotic lesions in posterior right ribs, left scapula L1 and the medial right iliac wing. Index lesion in the right iliac wing measures 1.5 x 2.9 cm (4/169) with an SUV max of 5.3. Incidental CT findings: Degenerative changes in the spine. IMPRESSION: 1. Metastatic disease involving thoracic and abdominal lymph nodes, right hemithorax and bones. 2. Loculated moderate right pleural effusion. Small left pleural effusion. 3. Prostate enlargement. Bladder wall thickening is indicative of an element of outlet obstruction. Electronically Signed: By: Lorin Picket M.D. On: 06/19/2020 09:20     ASSESSMENT/PLAN:  This isa very pleasant89 year old Caucasian male  who was diagnosed with stage IV non-small cell lung cancer, adenocarcinoma with a positive EGFR resistant mutation in exon 20.He may be eligible for targeted therapy in the future if he progresses on his current treatment.He presented with extensive disease involving the right upper lobe, right middle lobe, and right lower lobe with extensive pleural-based metastases. He also has mediastinal and hilar adenopathy and a rightmalignant pleural effusion. He also has metastatic disease to the brain and a suspicious liver lesion.  He completed SRS treatment to the metastatic disease to the brain under care of Dr. Tammi Klippel.  He had a pleurex catheter placed under the care of Dr. Roxan Hockey for his malignant pleural effusion.This was removed 07/11/2019  He recently had [place] treatment to the another suspicious brain lesion on 01/23/20 under the care of Dr. Tammi Klippel.  He then underwent systsystemic chemotherapy with  carboplatin AUC of 5, Alimta 500 mg/m, and Keytruda 200 mg IV every 3 weeks. He is status post20cycles. Starting from cycle #5, he has been on maintenance Alimta 500 mg/m2 and Keytruda 200 mg IV every 3 weeks. Beryle Flock was discontinued due to pneumonitis starting from cycle #17. This was discontinued after cycle #21 due to evidence for disease progression.   He is currently undergoing treatment with the new targeted agent Monocertinib (Axkibity) that was recently approved by the FDA for patient with EGFR exon 20 mutation. He started this on 06/29/20. He has been tolerating fairly well except for skin rash, dry mouth, and mild diarrhea.   The patient was seen with Dr. Julien Nordmann. Labs were reviewed. Recommend that he continue on the same treatment at the same dose.   We will send a prescription for hydrocortisone lotion to his pharmacy. He will continue to take imodium if needed for diarrhea. He will hydrate and use biotene for dry mouth. Per pharmacy recommendation, he will use cetaphil lotion for his dry skin.   We will see him back for a follow up visit in 2 weeks for evaluation and repeat blood work. At that next visit, we will likely arrange for his next follow up visit and scan in about 4 weeks from his next visit.   He will continue to meet with the lymphedema clinic for his lower extremity swelling. He also is going to meet with a member of the nutritionist team today regarding his low albumin for recommendations on how to increase his protein intake.   The patient was advised to call immediately if he has any concerning symptoms in the interval. The patient voices understanding of current disease status and treatment options and is in agreement with the current care plan. All questions were answered. The patient knows to call the clinic with any problems, questions or concerns. We can certainly see the patient much sooner if necessary   Orders Placed This Encounter  Procedures  . CBC with  Differential (Cancer Center Only)    Standing Status:   Future    Standing Expiration Date:   07/15/2021  . CMP ([facility] only)    Standing Status:   Future    Standing Expiration Date:   07/15/2021     Ethan Sos Delores Thelen, PA-C 07/15/20  ADDENDUM: Hematology/Oncology Attending: I had a face-to-face encounter with the patient today.  I recommended his care plan.  This is a very pleasant 69 years old white male with metastatic non-small cell lung cancer, adenocarcinoma with positive EGFR mutation in exon 20.  The patient underwent first-line systemic chemotherapy with carboplatin, Alimta and Keytruda followed  by maintenance treatment with Alimta and Keytruda for a total of 21 cycles.  His treatment was discontinued secondary to disease progression. The patient is currently undergoing second line treatment with targeted therapy with Monocertinib (Axkibity) started 2 weeks ago.  He is tolerating this treatment well except for very few episodes of diarrhea but he also has grade 1-2 skin rash on the face and hands.  His breathing and cough are better after starting Monocertinib (Axkibity).. I recommended to continue his current treatment with Monocertinib (Axkibity) with the same dose. For the skin rash, we will start the patient on hydrocortisone lotion and if needed will consider him for systemic steroid. The patient will come back for follow-up visit in 2 weeks for evaluation and management of any adverse effect of his treatment as well as repeat blood work. He was advised to call immediately if he has any concerning symptoms in the interval.  Disclaimer: This note was dictated with voice recognition software. Similar sounding words can inadvertently be transcribed and may be missed upon review. Eilleen Kempf, MD 07/15/20

## 2020-07-12 ENCOUNTER — Ambulatory Visit: Payer: Medicare Other

## 2020-07-12 ENCOUNTER — Other Ambulatory Visit: Payer: Self-pay

## 2020-07-12 DIAGNOSIS — M79662 Pain in left lower leg: Secondary | ICD-10-CM | POA: Diagnosis not present

## 2020-07-12 DIAGNOSIS — R600 Localized edema: Secondary | ICD-10-CM | POA: Diagnosis not present

## 2020-07-12 DIAGNOSIS — M79661 Pain in right lower leg: Secondary | ICD-10-CM | POA: Diagnosis not present

## 2020-07-12 DIAGNOSIS — C3491 Malignant neoplasm of unspecified part of right bronchus or lung: Secondary | ICD-10-CM

## 2020-07-12 NOTE — Patient Instructions (Signed)
Pt was given written and illustrated pics of LE compression bandaging.  He was visually instructed in all as wrapping was performed on him.  His wife will come in on Monday to learn wrapping and MLD

## 2020-07-13 NOTE — Therapy (Signed)
[place], [place], [address] Phone: [phone]   Fax:  [phone]  Physical Therapy Treatment  Patient Details  Name: Ethan Brown MRN: [id number] Date of Birth: [birth year] Referring Provider (PT): Tanner   Encounter Date: 07/12/2020   PT End of Session - 07/12/20 2333    Visit Number 5    Number of Visits 25    Date for PT Re-Evaluation 08/21/20    PT Start Time 0902    PT Stop Time 1002    PT Time Calculation (min) 60 min    Activity Tolerance Patient tolerated treatment well    Behavior During Therapy [facility] for tasks assessed/performed           Past Medical History:  Diagnosis Date  . Borderline hypertension   . Brain metastasis ([place]) dx'd 01/2019  . Detached vitreous humor, left   . Dyspnea   . ED (erectile dysfunction) of organic origin   . Hyperlipidemia   . Hypertension   . Lens subluxation, left   . Lung cancer ([place]) dx'd 01/2019   Stage IV  . Migraine headache    takes PRN Imitrex  . Pseudophakia   . TGA (transient global amnesia) 2017    Past Surgical History:  Procedure Laterality Date  . CAROTID DOPPLERS  09/2017   Mild non-obstructive disease  . CATARACT EXTRACTION, BILATERAL    . cataract, left  2018  . CHEST TUBE INSERTION Right 03/29/2019   Procedure: INSERTION PLEURAL DRAINAGE CATHETER;  Surgeon: [name], MD;  Location: [facility];  Service: Thoracic;  Laterality: Right;  . COLONOSCOPY  2016   clear, repeat in 10 yrs   . Eye surgeries     , Lens attachment.  Vitrectomy  . FEMORAL HERNIA REPAIR    . HERNIA REPAIR    . IR THORACENTESIS ASP PLEURAL SPACE W/IMG GUIDE  02/22/2019  . IR THORACENTESIS ASP PLEURAL SPACE W/IMG GUIDE  03/13/2019  . IR THORACENTESIS ASP PLEURAL SPACE W/IMG GUIDE  03/18/2020  . LUMBAR LAMINECTOMY    . PLEURAL BIOPSY Right 03/29/2019   Procedure: PLEURAL BIOPSY;  Surgeon: [name], MD;  Location: [facility];  Service:  Thoracic;  Laterality: Right;  . PORTACATH PLACEMENT N/A 03/29/2019   Procedure: INSERTION PORT-A-CATH;  Surgeon: [name], MD;  Location: [facility];  Service: Thoracic;  Laterality: N/A;  . retinal attachment, right    . sclearl buckle, right    . victrectomy,right    . VIDEO ASSISTED THORACOSCOPY Right 03/29/2019   Procedure: VIDEO ASSISTED THORACOSCOPY;  Surgeon: [name], MD;  Location: [facility];  Service: Thoracic;  Laterality: Right;    There were no vitals filed for this visit.   Subjective Assessment - 07/12/20 0902    Subjective Trying to balance the wrapping with a shower.  Took a shower this am and took wraps off. Legs were noticeably thinner when I took the wraps off, but I have been up for 3 hours now so they have swollen a little.  No SOB with wraps.    Pertinent History Diagnosed February 10, 2019 with Lung CA stage 4.  Had surgery July or August 20/20, Started chemo shortly thereafter.  No longer having keytruda infusions or others because they aren't working.  Takes a tablet now that addresses his marker but it is very expensive/  Has had 2 radiation treatments on his brain and has 1 left.  Also has prostate CA, Had a PET  scan a few weeks ago and has METS to his vertebrae.  Will have a multi D meeting next week with oncologist, radiologist, and urologist    Pain Score 0-No pain                             [facility] Adult PT Treatment/Exercise - 07/12/20 0001      Manual Therapy   Manual Therapy Manual Lymphatic Drainage (MLD);Compression Bandaging    Edema Management applied large TG soft to bilateral LE's after rx. Pt to not fold over the top and put on exercise shorts when he gets home.  Wrapped bilateral  LE wiith artiflex to thigh, elastomull to toes 1-4, 8 cm to foot with figure 8's, 10 cm to ankle and lower leg with heel locks, and 12 cm to knee, 2 12 cm to thigh, with 1/2 in gray foam at top to prevent sliding down.    Manual Lymphatic Drainage  (MLD) Short neck, left inguinal, 5 diaphragmatic breaths, left lateral upper leg, medial to lateral thigh, and lateral thigh proximal to groin, followed by ant and posterior lower leg retracing steps except medial to lateral thigh and then foot retracing steps to groin.  Repated same steps on right LE to right inguinal LN's with legs elevated.  Lotion applied to legs before wrapping                  PT Education - 07/12/20 2332    Education Details wrapping technique was demonstrated and verbalized to pt as it was performed in clinic.  Pt was issued written instructions for bandaging    Person(s) Educated Patient    Methods Explanation;Demonstration    Comprehension Verbalized understanding;Other (comment)   Pts. wife will come Monday for wrapping and MLD instruction              PT Long Term Goals - 06/26/20 1238      PT LONG TERM GOAL #1   Title Pt will be able to tolerate compression bandaging for reduction of LE edema    Baseline legs very tender    Time 3    Period Weeks    Status New    Target Date 07/17/20      PT LONG TERM GOAL #2   Title Pt will note decreased bilateral redness and pain following wrapping by atleast 50%    Time 6    Period Weeks    Status New      PT LONG TERM GOAL #3   Title Pt will have decreased edema by 3-4 cm at 30 cm prox to floor bilaterally    Time 6    Period Weeks    Status New    Target Date 08/07/20      PT LONG TERM GOAL #4   Title Pt will be fit for and be able to wear compression garments to manage edema    Time 8    Period Weeks    Status New    Target Date 08/21/20                 Plan - 07/12/20 2334    Clinical Impression Statement Pt has had good reduction in bilateral LE's since starting bandaging and has had no SOB with wraps on.  Therapy consisted of MLD to bilateral LE's and compression bandaging with verbalization to pt as it was being performed. Pts wife will come on Monday to learn  bandaging.     Personal Factors and Comorbidities Comorbidity 3+    Comorbidities Lung CA with METS, Prostate Cancer, Cellulitis now improved    Stability/Clinical Decision Making Evolving/Moderate complexity    Clinical Decision Making Moderate    Rehab Potential Good    PT Frequency 3x / week    PT Duration 8 weeks    PT Treatment/Interventions Therapeutic exercise;Patient/family education;Manual techniques;Compression bandaging;Manual lymph drainage    PT Next Visit Plan continue MLD to groin LN only, assess wrapping for SOB, benefit, rewrap.  Buy compression shorts to hold up wraps    PT Home Exercise Plan Diaphragmatic breathing/elevation/TG soft if comfortable    Consulted and Agree with Plan of Care Patient           Patient will benefit from skilled therapeutic intervention in order to improve the following deficits and impairments:  Pain, Decreased skin integrity, Decreased knowledge of precautions, Increased edema  Visit Diagnosis: Localized edema  Primary lung cancer with metastasis from lung to other site, right (HCC)  Pain in right lower leg  Pain in left lower leg     Problem List Patient Active Problem List   Diagnosis Date Noted  . Shortness of breath 03/12/2020  . Bilateral lower extremity edema 02/22/2020  . Port-A-Cath in place 04/25/2019  . Brain metastasis ([place]) 04/07/2019  . Malignant pleural effusion 03/29/2019  . Adenocarcinoma of right lung, stage 4 ([place]) 03/15/2019  . Encounter for antineoplastic chemotherapy 03/15/2019  . Encounter for antineoplastic immunotherapy 03/15/2019  . Goals of care, counseling/discussion 03/15/2019  . Recurrent right pleural effusion 03/15/2019  . Pleural effusion 02/20/2019  . Nonspecific abnormal electrocardiogram (ECG) (EKG) 02/20/2019  . Preop cardiovascular exam 02/20/2019  . Pulmonary nodule, right 02/16/2019  . Chronic cough 02/16/2019  . B12 deficiency 11/24/2018  . Neuropathy, peripheral 11/24/2018  . Migraines  06/14/2018  . Retinal defect, left 06/14/2018  . Borderline hypertension 11/04/2017    Claris Pong 07/13/2020, 12:08 AM  [place], [place], [address] Phone: [phone]   Fax:  [phone]  Name: Ronal Maybury MRN: [id number] Date of Birth: 06-28-1951 Cheral Almas, PT 07/13/20 12:10 AM

## 2020-07-15 ENCOUNTER — Inpatient Hospital Stay: Payer: Medicare Other

## 2020-07-15 ENCOUNTER — Ambulatory Visit: Payer: Medicare Other

## 2020-07-15 ENCOUNTER — Inpatient Hospital Stay: Payer: Medicare Other | Admitting: Nutrition

## 2020-07-15 ENCOUNTER — Telehealth: Payer: Self-pay | Admitting: Medical Oncology

## 2020-07-15 ENCOUNTER — Other Ambulatory Visit: Payer: Self-pay

## 2020-07-15 ENCOUNTER — Other Ambulatory Visit: Payer: Self-pay | Admitting: Internal Medicine

## 2020-07-15 ENCOUNTER — Encounter: Payer: Self-pay | Admitting: Physician Assistant

## 2020-07-15 ENCOUNTER — Inpatient Hospital Stay (HOSPITAL_BASED_OUTPATIENT_CLINIC_OR_DEPARTMENT_OTHER): Payer: Medicare Other | Admitting: Physician Assistant

## 2020-07-15 VITALS — BP 139/87 | HR 78 | Temp 98.4°F | Resp 12 | Ht 71.0 in | Wt 204.4 lb

## 2020-07-15 DIAGNOSIS — I890 Lymphedema, not elsewhere classified: Secondary | ICD-10-CM

## 2020-07-15 DIAGNOSIS — C7931 Secondary malignant neoplasm of brain: Secondary | ICD-10-CM | POA: Diagnosis not present

## 2020-07-15 DIAGNOSIS — C3411 Malignant neoplasm of upper lobe, right bronchus or lung: Secondary | ICD-10-CM | POA: Diagnosis not present

## 2020-07-15 DIAGNOSIS — E079 Disorder of thyroid, unspecified: Secondary | ICD-10-CM

## 2020-07-15 DIAGNOSIS — M79661 Pain in right lower leg: Secondary | ICD-10-CM

## 2020-07-15 DIAGNOSIS — C3491 Malignant neoplasm of unspecified part of right bronchus or lung: Secondary | ICD-10-CM

## 2020-07-15 DIAGNOSIS — R21 Rash and other nonspecific skin eruption: Secondary | ICD-10-CM

## 2020-07-15 DIAGNOSIS — L270 Generalized skin eruption due to drugs and medicaments taken internally: Secondary | ICD-10-CM | POA: Diagnosis not present

## 2020-07-15 DIAGNOSIS — R590 Localized enlarged lymph nodes: Secondary | ICD-10-CM | POA: Diagnosis not present

## 2020-07-15 DIAGNOSIS — Z923 Personal history of irradiation: Secondary | ICD-10-CM | POA: Diagnosis not present

## 2020-07-15 DIAGNOSIS — R600 Localized edema: Secondary | ICD-10-CM

## 2020-07-15 DIAGNOSIS — M79662 Pain in left lower leg: Secondary | ICD-10-CM

## 2020-07-15 DIAGNOSIS — C7951 Secondary malignant neoplasm of bone: Secondary | ICD-10-CM | POA: Diagnosis not present

## 2020-07-15 DIAGNOSIS — Z95828 Presence of other vascular implants and grafts: Secondary | ICD-10-CM

## 2020-07-15 DIAGNOSIS — C787 Secondary malignant neoplasm of liver and intrahepatic bile duct: Secondary | ICD-10-CM | POA: Diagnosis not present

## 2020-07-15 LAB — CMP (CANCER CENTER ONLY)
ALT: 16 U/L (ref 0–44)
AST: 24 U/L (ref 15–41)
Albumin: 2.4 g/dL — ABNORMAL LOW (ref 3.5–5.0)
Alkaline Phosphatase: 72 U/L (ref 38–126)
Anion gap: 6 (ref 5–15)
BUN: 26 mg/dL — ABNORMAL HIGH (ref 8–23)
CO2: 23 mmol/L (ref 22–32)
Calcium: 8.6 mg/dL — ABNORMAL LOW (ref 8.9–10.3)
Chloride: 110 mmol/L (ref 98–111)
Creatinine: 1.09 mg/dL (ref 0.61–1.24)
GFR, Estimated: >60 mL/min (ref >60)
Glucose, Bld: 99 mg/dL (ref 70–99)
Potassium: 4 mmol/L (ref 3.5–5.1)
Sodium: 139 mmol/L (ref 135–145)
Total Bilirubin: <0.2 mg/dL — ABNORMAL LOW (ref 0.3–1.2)
Total Protein: 5.8 g/dL — ABNORMAL LOW (ref 6.5–8.1)

## 2020-07-15 LAB — CBC WITH DIFFERENTIAL (CANCER CENTER ONLY)
Abs Immature Granulocytes: 0.01 10*3/uL (ref 0.00–0.07)
Basophils Absolute: 0 10*3/uL (ref 0.0–0.1)
Basophils Relative: 1 %
Eosinophils Absolute: 0.1 10*3/uL (ref 0.0–0.5)
Eosinophils Relative: 1 %
HCT: 30.7 % — ABNORMAL LOW (ref 39.0–52.0)
Hemoglobin: 9.8 g/dL — ABNORMAL LOW (ref 13.0–17.0)
Immature Granulocytes: 0 %
Lymphocytes Relative: 24 %
Lymphs Abs: 1.2 10*3/uL (ref 0.7–4.0)
MCH: 30.8 pg (ref 26.0–34.0)
MCHC: 31.9 g/dL (ref 30.0–36.0)
MCV: 96.5 fL (ref 80.0–100.0)
Monocytes Absolute: 0.4 10*3/uL (ref 0.1–1.0)
Monocytes Relative: 8 %
Neutro Abs: 3.3 10*3/uL (ref 1.7–7.7)
Neutrophils Relative %: 66 %
Platelet Count: 239 10*3/uL (ref 150–400)
RBC: 3.18 MIL/uL — ABNORMAL LOW (ref 4.22–5.81)
RDW: 14.6 % (ref 11.5–15.5)
WBC Count: 5.1 10*3/uL (ref 4.0–10.5)
nRBC: 0 % (ref 0.0–0.2)

## 2020-07-15 LAB — TSH: TSH: 0.855 u[IU]/mL (ref 0.320–4.118)

## 2020-07-15 LAB — MAGNESIUM: Magnesium: 1.7 mg/dL (ref 1.7–2.4)

## 2020-07-15 MED ORDER — HEPARIN SOD (PORK) LOCK FLUSH 100 UNIT/ML IV SOLN
500.0000 [IU] | Freq: Once | INTRAVENOUS | Status: AC | PRN
  Administered 2020-07-15: 500 [IU]
  Filled 2020-07-15: qty 5

## 2020-07-15 MED ORDER — HYDROCORTISONE 1 % EX LOTN: 1.0000 "application " | TOPICAL_LOTION | Freq: Two times a day (BID) | CUTANEOUS | 0 refills | Status: DC

## 2020-07-15 MED ORDER — EXKIVITY 40 MG PO CAPS: 160.0000 mg | ORAL_CAPSULE | Freq: Every morning | ORAL | 3 refills | Status: DC

## 2020-07-15 MED ORDER — SODIUM CHLORIDE 0.9% FLUSH
10.0000 mL | INTRAVENOUS | Status: DC | PRN
  Administered 2020-07-15: 10 mL
  Filled 2020-07-15: qty 10

## 2020-07-15 NOTE — Telephone Encounter (Signed)
Mobocertinib rx faxed to [place].

## 2020-07-15 NOTE — Patient Instructions (Signed)
Pts wife was given written instructions for MLD to LE's performed as in clinic.  She was also given illustrated and written instructions for bandaging.

## 2020-07-15 NOTE — Therapy (Signed)
[place], [place], [address] Phone: [phone]   Fax:  [phone]  Physical Therapy Treatment  Patient Details  Name: Ethan Brown MRN: [id number] Date of Birth: May 11, 1951 Referring Provider (PT): Tanner   Encounter Date: 07/15/2020   PT End of Session - 07/15/20 1217    Visit Number 6    Number of Visits 25    Date for PT Re-Evaluation 08/21/20    PT Start Time 0802    PT Stop Time 0903    PT Time Calculation (min) 61 min    Activity Tolerance Patient tolerated treatment well    Behavior During Therapy [facility] for tasks assessed/performed           Past Medical History:  Diagnosis Date  . Borderline hypertension   . Brain metastasis ([place]) dx'd 01/2019  . Detached vitreous humor, left   . Dyspnea   . ED (erectile dysfunction) of organic origin   . Hyperlipidemia   . Hypertension   . Lens subluxation, left   . Lung cancer ([place]) dx'd 01/2019   Stage IV  . Migraine headache    takes PRN Imitrex  . Pseudophakia   . TGA (transient global amnesia) 2017    Past Surgical History:  Procedure Laterality Date  . CAROTID DOPPLERS  09/2017   Mild non-obstructive disease  . CATARACT EXTRACTION, BILATERAL    . cataract, left  2018  . CHEST TUBE INSERTION Right 03/29/2019   Procedure: INSERTION PLEURAL DRAINAGE CATHETER;  Surgeon: [name], MD;  Location: [facility];  Service: Thoracic;  Laterality: Right;  . COLONOSCOPY  2016   clear, repeat in 10 yrs   . Eye surgeries     , Lens attachment.  Vitrectomy  . FEMORAL HERNIA REPAIR    . HERNIA REPAIR    . IR THORACENTESIS ASP PLEURAL SPACE W/IMG GUIDE  02/22/2019  . IR THORACENTESIS ASP PLEURAL SPACE W/IMG GUIDE  03/13/2019  . IR THORACENTESIS ASP PLEURAL SPACE W/IMG GUIDE  03/18/2020  . LUMBAR LAMINECTOMY    . PLEURAL BIOPSY Right 03/29/2019   Procedure: PLEURAL BIOPSY;  Surgeon: [name], MD;  Location: [place];  Service:  Thoracic;  Laterality: Right;  . PORTACATH PLACEMENT N/A 03/29/2019   Procedure: INSERTION PORT-A-CATH;  Surgeon: [name], MD;  Location: [facility];  Service: Thoracic;  Laterality: N/A;  . retinal attachment, right    . sclearl buckle, right    . victrectomy,right    . VIDEO ASSISTED THORACOSCOPY Right 03/29/2019   Procedure: VIDEO ASSISTED THORACOSCOPY;  Surgeon: [name], MD;  Location: [facility];  Service: Thoracic;  Laterality: Right;    There were no vitals filed for this visit.   Subjective Assessment - 07/15/20 1211    Subjective Got the shorts yesterday but wanted to be sure they were right before I wore them.  The wraps slid down the second day and my leg got swollen above it.  We took them off last night and washed them.  My legs seem to be staying down better    Patient is accompained by: Family member   pts. wife   Currently in Pain? No/denies                             [facility] Adult PT Treatment/Exercise - 07/15/20 0001      Manual Therapy   Manual Therapy Manual Lymphatic Drainage (MLD);Compression Bandaging  Edema Management applied large TG soft to bilateral LE's after rx. Pt to not fold over the top and put on exercise shorts when he gets home.  Wrapped bilateral  LE wiith artiflex to thigh, elastomull to toes 1-4, 8 cm to foot with figure 8's, 10 cm to ankle and lower leg with heel locks, and 12 cm to knee, 2 12 cm to thigh, with 1/2 in gray foam at top to prevent sliding down.Marland Kitchen Pts wife wrapped left LE while I wrapped right with occasional VC's for proper technique    Manual Lymphatic Drainage (MLD) Short neck, left inguinal, 5 diaphragmatic breaths, left lateral upper leg, medial to lateral thigh, and lateral thigh proximal to groin, followed by ant and posterior lower leg retracing steps except medial to lateral thigh and then foot retracing steps to groin.  Instructed pts wife in MLD and she repeated same steps on right LE to right  inguinal LN's with legs elevated.  Lotion applied to legs before wrapping                  PT Education - 07/15/20 1216    Education Details Pts wife was educated in Manual lymph drainage and compression bandaging and required occasional VC's for proper performance.  She will try and return for another visit.    Person(s) Educated Patient;Spouse    Methods Explanation;Demonstration;Handout    Comprehension Verbalized understanding;Returned demonstration;Need further instruction               PT Long Term Goals - 06/26/20 1238      PT LONG TERM GOAL #1   Title Pt will be able to tolerate compression bandaging for reduction of LE edema    Baseline legs very tender    Time 3    Period Weeks    Status New    Target Date 07/17/20      PT LONG TERM GOAL #2   Title Pt will note decreased bilateral redness and pain following wrapping by atleast 50%    Time 6    Period Weeks    Status New      PT LONG TERM GOAL #3   Title Pt will have decreased edema by 3-4 cm at 30 cm prox to floor bilaterally    Time 6    Period Weeks    Status New    Target Date 08/07/20      PT LONG TERM GOAL #4   Title Pt will be fit for and be able to wear compression garments to manage edema    Time 8    Period Weeks    Status New    Target Date 08/21/20                 Plan - 07/15/20 1218    Clinical Impression Statement pt continues to haave good reduction in his legs withot causing symptoms elsewhere.  His wife was educated in MLD and compression bandaging and did quite well for first attempt.  He did purchase compression shorts which should help keep his bandages up.  His wife will require further review    Personal Factors and Comorbidities Comorbidity 3+    Comorbidities Lung CA with METS, Prostate Cancer, Cellulitis now improved    Stability/Clinical Decision Making Evolving/Moderate complexity    Rehab Potential Good    PT Frequency 3x / week    PT Duration 8 weeks    PT  Treatment/Interventions Therapeutic exercise;Patient/family education;Manual techniques;Compression bandaging;Manual lymph drainage  PT Next Visit Plan Measure,Cont MLD, compression bandaging, compression shorts.  Continue education to his wife prn    PT Home Exercise Plan continue diaphragmatic breathing, MLD by wife is possible, bandaging by his wife    Consulted and Agree with Plan of Care Patient;Family member/caregiver           Patient will benefit from skilled therapeutic intervention in order to improve the following deficits and impairments:  Pain, Decreased skin integrity, Decreased knowledge of precautions, Increased edema  Visit Diagnosis: Localized edema  Primary lung cancer with metastasis from lung to other site, right (HCC)  Pain in right lower leg  Pain in left lower leg     Problem List Patient Active Problem List   Diagnosis Date Noted  . Shortness of breath 03/12/2020  . Bilateral lower extremity edema 02/22/2020  . Port-A-Cath in place 04/25/2019  . Brain metastasis ([place]) 04/07/2019  . Malignant pleural effusion 03/29/2019  . Adenocarcinoma of right lung, stage 4 ([place]) 03/15/2019  . Encounter for antineoplastic chemotherapy 03/15/2019  . Encounter for antineoplastic immunotherapy 03/15/2019  . Goals of care, counseling/discussion 03/15/2019  . Recurrent right pleural effusion 03/15/2019  . Pleural effusion 02/20/2019  . Nonspecific abnormal electrocardiogram (ECG) (EKG) 02/20/2019  . Preop cardiovascular exam 02/20/2019  . Pulmonary nodule, right 02/16/2019  . Chronic cough 02/16/2019  . B12 deficiency 11/24/2018  . Neuropathy, peripheral 11/24/2018  . Migraines 06/14/2018  . Retinal defect, left 06/14/2018  . Borderline hypertension 11/04/2017    Claris Pong 07/15/2020, 12:23 PM  [place], [place], [address] Phone: [phone]   Fax:  [phone]  Name:  Ethan Brown MRN: [id number] Date of Birth: [birth year]  Cheral Almas, PT 07/15/20 12:24 PM

## 2020-07-15 NOTE — Progress Notes (Signed)
69 year old male diagnosed with stage IV lung cancer with brain metastases.  He is followed by Dr. Julien Nordmann.  Past medical history includes TGA, hypertension, hyperlipidemia, and borderline hypertension.  Medications include vitamin B12, Ativan, multivitamin with minerals, Compazine.  Labs include BUN 26, creatinine 1.09, total protein 5.8 and albumin 2.4 on November 22.  Height: 5 feet 11 inches. Weight: 204.4 pounds. BMI: 28.51.  May not be accurate secondary to bilateral lower extremity edema.  He is currently undergoing treatment with the new targeted agent Monocertinib (Axkibity) that was recently approved by the FDA for patient with EGFR exon 20 mutation. He started this on 06/29/20. He has been tolerating fairly well except for skin rash, dry mouth, and mild diarrhea.  He takes Imodium for diarrhea. His legs were wrapped with compression bandages.   He attends the lymphedema clinic. He denies difficulty eating and consumes a variety of high-protein foods frequently throughout the day. He follows a low-sodium diet and reports he doubts he eats more than 2000 mg of sodium every day.   He is concerned with low albumin.  Nutrition diagnosis: Food and nutrition related knowledge deficit related to stage IV lung cancer and associated treatments as evidenced by no prior need for nutrition related information.  Intervention: Educated patient on the importance of smaller more frequent meals and snacks with adequate calories and protein to maintain lean body mass. Reviewed strategies for reducing edema. Encourage patient to consume increased fruits and vegetables as well as lean proteins. Discouraged high sodium foods. Encouraged adequate fluid intake. Provided fact sheets.  Questions were answered.  Teach back method used.  Contact information provided. Explained Albumin has a half-life of 21 days and is strongly affected by stress response and inflammatory process.  Monitoring, evaluation,  goals: Patient will tolerate adequate calories and protein to maintain lean body mass.  Next visit: No follow-up has been scheduled.  Patient has my contact information for further questions and concerns.  **Disclaimer: This note was dictated with voice recognition software. Similar sounding words can inadvertently be transcribed and this note may contain transcription errors which may not have been corrected upon publication of note.**

## 2020-07-17 ENCOUNTER — Telehealth: Payer: Self-pay | Admitting: Physician Assistant

## 2020-07-17 NOTE — Telephone Encounter (Signed)
Scheduled per los. Called and spoke with patient. Confirmed appt 

## 2020-07-22 ENCOUNTER — Ambulatory Visit: Payer: Medicare Other

## 2020-07-22 ENCOUNTER — Other Ambulatory Visit: Payer: Self-pay

## 2020-07-22 DIAGNOSIS — M79661 Pain in right lower leg: Secondary | ICD-10-CM

## 2020-07-22 DIAGNOSIS — R600 Localized edema: Secondary | ICD-10-CM | POA: Diagnosis not present

## 2020-07-22 DIAGNOSIS — M79662 Pain in left lower leg: Secondary | ICD-10-CM

## 2020-07-22 DIAGNOSIS — C3491 Malignant neoplasm of unspecified part of right bronchus or lung: Secondary | ICD-10-CM | POA: Diagnosis not present

## 2020-07-22 NOTE — Therapy (Signed)
[place], [place], [address] Phone: [phone]   Fax:  [phone]  Physical Therapy Treatment  Patient Details  Name: Ethan Brown MRN: [id number] Date of Birth: 1951-01-23 Referring Provider (PT): Tanner   Encounter Date: 07/22/2020   PT End of Session - 07/22/20 1213    Visit Number 7    Number of Visits 25    Date for PT Re-Evaluation 08/21/20    PT Start Time 9937    PT Stop Time 1100    PT Time Calculation (min) 58 min    Activity Tolerance Patient tolerated treatment well    Behavior During Therapy [facility] for tasks assessed/performed           Past Medical History:  Diagnosis Date  . Borderline hypertension   . Brain metastasis ([place]) dx'd 01/2019  . Detached vitreous humor, left   . Dyspnea   . ED (erectile dysfunction) of organic origin   . Hyperlipidemia   . Hypertension   . Lens subluxation, left   . Lung cancer ([place]) dx'd 01/2019   Stage IV  . Migraine headache    takes PRN Imitrex  . Pseudophakia   . TGA (transient global amnesia) 2017    Past Surgical History:  Procedure Laterality Date  . CAROTID DOPPLERS  09/2017   Mild non-obstructive disease  . CATARACT EXTRACTION, BILATERAL    . cataract, left  2018  . CHEST TUBE INSERTION Right 03/29/2019   Procedure: INSERTION PLEURAL DRAINAGE CATHETER;  Surgeon: [name], MD;  Location: [place];  Service: Thoracic;  Laterality: Right;  . COLONOSCOPY  2016   clear, repeat in 10 yrs   . Eye surgeries     , Lens attachment.  Vitrectomy  . FEMORAL HERNIA REPAIR    . HERNIA REPAIR    . IR THORACENTESIS ASP PLEURAL SPACE W/IMG GUIDE  02/22/2019  . IR THORACENTESIS ASP PLEURAL SPACE W/IMG GUIDE  03/13/2019  . IR THORACENTESIS ASP PLEURAL SPACE W/IMG GUIDE  03/18/2020  . LUMBAR LAMINECTOMY    . PLEURAL BIOPSY Right 03/29/2019   Procedure: PLEURAL BIOPSY;  Surgeon: [name], MD;  Location: [place];  Service:  Thoracic;  Laterality: Right;  . PORTACATH PLACEMENT N/A 03/29/2019   Procedure: INSERTION PORT-A-CATH;  Surgeon: [name], MD;  Location: [place];  Service: Thoracic;  Laterality: N/A;  . retinal attachment, right    . sclearl buckle, right    . victrectomy,right    . VIDEO ASSISTED THORACOSCOPY Right 03/29/2019   Procedure: VIDEO ASSISTED THORACOSCOPY;  Surgeon: [name], MD;  Location: [place];  Service: Thoracic;  Laterality: Right;    There were no vitals filed for this visit.   Subjective Assessment - 07/22/20 1003    Subjective Kept up the wrapping every day over the weekend.  I don't think they can get much smaller.  Legs don't seem to be swelling like they did.  I have been taking the wraps off at night but I dont see much reacumulation of fluid. Less pain when lowering his legs from elevated position.    Pertinent History Diagnosed February 10, 2019 with Lung CA stage 4.  Had surgery July or August 20/20, Started chemo shortly thereafter.  No longer having keytruda infusions or others because they aren't working.  Takes a tablet now that addresses his marker but it is very expensive/  Has had 2 radiation treatments on his brain and has 1 left.  Also has prostate CA, Had a PET scan a few weeks ago and has METS to his vertebrae.  Will have a multi D meeting next week with oncologist, radiologist, and urologist    Patient Stated Goals Decrease swelling in legs    Pain Score 0-No pain    Pain Onset More than a month ago                 LYMPHEDEMA/ONCOLOGY QUESTIONNAIRE - 07/22/20 0001      Right Lower Extremity Lymphedema   20 cm Proximal to Suprapatella 50.7 cm    10 cm Proximal to Suprapatella 45.2 cm    At Midpatella/Popliteal Crease 40.7 cm    30 cm Proximal to Floor at Lateral Plantar Foot 33 cm    20 cm Proximal to Floor at Lateral Plantar Foot 24.7 1    10  cm Proximal to Floor at Lateral Malleoli 25.4 cm    5 cm Proximal to 1st MTP Joint 25.5 cm     Across MTP Joint 25.8 cm    Around Proximal Great Toe 9.5 cm      Left Lower Extremity Lymphedema   At Groin Measure at Horizontal from Pubic Bone 53.5 cm    20 cm Proximal to Suprapatella 50.2 cm    10 cm Proximal to Suprapatella 44.6 cm    At Midpatella/Popliteal Crease 41 cm    30 cm Proximal to Floor at Lateral Plantar Foot 33.5 cm    20 cm Proximal to Floor at Lateral Plantar Foot 24.7 cm    10 cm Proximal to Floor at Lateral Malleoli 25 cm    5 cm Proximal to 1st MTP Joint 25 cm    Across MTP Joint 25.5 cm    Around Proximal Great Toe 9.3 cm                      [facility] Adult PT Treatment/Exercise - 07/22/20 0001      Manual Therapy   Manual therapy comments remeasured legs    Edema Management applied large TG soft to bilateral LE's after rx. Pt to not fold over the top and put on exercise shorts when he gets home.  Wrapped bilateral  LE wiith artiflex to thigh, elastomull to toes 1-4, 8 cm to foot with figure 8's, 10 cm to ankle and lower leg with heel locks, and 12 cm to knee, 2 12 cm to thigh, with 1/2 in gray foam at top to prevent sliding down..                      PT Long Term Goals - 06/26/20 1238      PT LONG TERM GOAL #1   Title Pt will be able to tolerate compression bandaging for reduction of LE edema    Baseline legs very tender    Time 3    Period Weeks    Status New    Target Date 07/17/20      PT LONG TERM GOAL #2   Title Pt will note decreased bilateral redness and pain following wrapping by atleast 50%    Time 6    Period Weeks    Status New      PT LONG TERM GOAL #3   Title Pt will have decreased edema by 3-4 cm at 30 cm prox to floor bilaterally    Time 6    Period Weeks    Status New    Target Date  08/07/20      PT LONG TERM GOAL #4   Title Pt will be fit for and be able to wear compression garments to manage edema    Time 8    Period Weeks    Status New    Target Date 08/21/20                 Plan -  07/22/20 1214    Clinical Impression Statement Pts legs are greatly improved overall with 2-4 cm reduction throughout.  He does have mild toe swelling since not wrapped by his wife and mild pitting edema at medial lower legs but greatly improved from the beginning.  He will wait until Thursday to get measured for stockings at [facility].  When measured today he was at the high end of a medium and low end of a large.    Personal Factors and Comorbidities Comorbidity 3+    Comorbidities Lung CA with METS, Prostate Cancer, Cellulitis now improved    Stability/Clinical Decision Making Evolving/Moderate complexity    Clinical Decision Making Moderate    Rehab Potential Good    PT Frequency 3x / week    PT Duration 8 weeks    PT Treatment/Interventions Therapeutic exercise;Patient/family education;Manual techniques;Compression bandaging;Manual lymph drainage    PT Next Visit Plan MLD,compression bandaging and shorts, instruct wife again in MLD /bandaging prn, pt wants to go to [place] on Thursday for stockings    PT Home Exercise Plan continue diaphragmatic breathing, MLD by wife if possible, bandaging by his wife    Consulted and Agree with Plan of Care Patient           Patient will benefit from skilled therapeutic intervention in order to improve the following deficits and impairments:  Pain, Decreased skin integrity, Decreased knowledge of precautions, Increased edema  Visit Diagnosis: Localized edema  Primary lung cancer with metastasis from lung to other site, right (HCC)  Pain in right lower leg  Pain in left lower leg     Problem List Patient Active Problem List   Diagnosis Date Noted  . Drug-induced skin rash 07/15/2020  . Shortness of breath 03/12/2020  . Bilateral lower extremity edema 02/22/2020  . Port-A-Cath in place 04/25/2019  . Brain metastasis ([place]) 04/07/2019  . Malignant pleural effusion 03/29/2019  . Adenocarcinoma of right lung, stage 4 ([place]) 03/15/2019  .  Encounter for antineoplastic chemotherapy 03/15/2019  . Encounter for antineoplastic immunotherapy 03/15/2019  . Goals of care, counseling/discussion 03/15/2019  . Recurrent right pleural effusion 03/15/2019  . Pleural effusion 02/20/2019  . Nonspecific abnormal electrocardiogram (ECG) (EKG) 02/20/2019  . Preop cardiovascular exam 02/20/2019  . Pulmonary nodule, right 02/16/2019  . Chronic cough 02/16/2019  . B12 deficiency 11/24/2018  . Neuropathy, peripheral 11/24/2018  . Migraines 06/14/2018  . Retinal defect, left 06/14/2018  . Borderline hypertension 11/04/2017    Claris Pong 07/22/2020, 12:24 PM  [place], [place], [address] Phone: [phone]   Fax:  [phone]  Name: Ethan Brown MRN: [id number] Date of Birth: 05/12/1951  Cheral Almas, PT 07/22/20 12:24 PM

## 2020-07-22 NOTE — Patient Instructions (Signed)
Pt given info on ETI in [place].  He will wait until Thursday to go when he has had a good wrap on here on Wednesday.  Pt will have his wife come if possible to watch MLD/bandaging again especially toe wraps

## 2020-07-24 ENCOUNTER — Encounter: Payer: Self-pay | Admitting: Physical Therapy

## 2020-07-24 ENCOUNTER — Ambulatory Visit: Payer: Medicare Other | Attending: Medical | Admitting: Physical Therapy

## 2020-07-24 ENCOUNTER — Other Ambulatory Visit: Payer: Self-pay

## 2020-07-24 DIAGNOSIS — R600 Localized edema: Secondary | ICD-10-CM | POA: Diagnosis not present

## 2020-07-24 DIAGNOSIS — M79662 Pain in left lower leg: Secondary | ICD-10-CM | POA: Insufficient documentation

## 2020-07-24 DIAGNOSIS — M79661 Pain in right lower leg: Secondary | ICD-10-CM | POA: Insufficient documentation

## 2020-07-24 NOTE — Therapy (Signed)
[place], [place], [address] Phone: [phone]   Fax:  [phone]  Physical Therapy Treatment  Patient Details  Name: Ethan Brown MRN: [id number] Date of Birth: [birth year] Referring Provider (PT): Tanner   Encounter Date: 07/24/2020   PT End of Session - 07/24/20 1717    Visit Number 8    Number of Visits 25    Date for PT Re-Evaluation 08/21/20    PT Start Time 1749    PT Stop Time 4496    PT Time Calculation (min) 68 min    Activity Tolerance Patient tolerated treatment well    Behavior During Therapy [facility] for tasks assessed/performed           Past Medical History:  Diagnosis Date  . Borderline hypertension   . Brain metastasis ([place]) dx'd 01/2019  . Detached vitreous humor, left   . Dyspnea   . ED (erectile dysfunction) of organic origin   . Hyperlipidemia   . Hypertension   . Lens subluxation, left   . Lung cancer ([place]) dx'd 01/2019   Stage IV  . Migraine headache    takes PRN Imitrex  . Pseudophakia   . TGA (transient global amnesia) 2017    Past Surgical History:  Procedure Laterality Date  . CAROTID DOPPLERS  09/2017   Mild non-obstructive disease  . CATARACT EXTRACTION, BILATERAL    . cataract, left  2018  . CHEST TUBE INSERTION Right 03/29/2019   Procedure: INSERTION PLEURAL DRAINAGE CATHETER;  Surgeon: [name], MD;  Location: [place];  Service: Thoracic;  Laterality: Right;  . COLONOSCOPY  2016   clear, repeat in 10 yrs   . Eye surgeries     , Lens attachment.  Vitrectomy  . FEMORAL HERNIA REPAIR    . HERNIA REPAIR    . IR THORACENTESIS ASP PLEURAL SPACE W/IMG GUIDE  02/22/2019  . IR THORACENTESIS ASP PLEURAL SPACE W/IMG GUIDE  03/13/2019  . IR THORACENTESIS ASP PLEURAL SPACE W/IMG GUIDE  03/18/2020  . LUMBAR LAMINECTOMY    . PLEURAL BIOPSY Right 03/29/2019   Procedure: PLEURAL BIOPSY;  Surgeon: [name], MD;  Location: [place];  Service:  Thoracic;  Laterality: Right;  . PORTACATH PLACEMENT N/A 03/29/2019   Procedure: INSERTION PORT-A-CATH;  Surgeon: [name], MD;  Location: [place];  Service: Thoracic;  Laterality: N/A;  . retinal attachment, right    . sclearl buckle, right    . victrectomy,right    . VIDEO ASSISTED THORACOSCOPY Right 03/29/2019   Procedure: VIDEO ASSISTED THORACOSCOPY;  Surgeon: [name], MD;  Location: [place];  Service: Thoracic;  Laterality: Right;    There were no vitals filed for this visit.   Subjective Assessment - 07/24/20 1608    Subjective I am going to go tomorrow to [place] to get stockings.    Pertinent History Diagnosed February 10, 2019 with Lung CA stage 4.  Had surgery July or August 20/20, Started chemo shortly thereafter.  No longer having keytruda infusions or others because they aren't working.  Takes a tablet now that addresses his marker but it is very expensive/  Has had 2 radiation treatments on his brain and has 1 left.  Also has prostate CA, Had a PET scan a few weeks ago and has METS to his vertebrae.  Will have a multi D meeting next week with oncologist, radiologist, and urologist    Patient Stated Goals Decrease swelling in legs  Currently in Pain? No/denies    Pain Score 0-No pain                             [facility] Adult PT Treatment/Exercise - 07/24/20 0001      Manual Therapy   Edema Management applied large TG soft to bilateral LE's after rx. Pt to not fold over the top and put on exercise shorts when he gets home.  Wrapped bilateral  LE wiith artiflex to thigh, elastomull to toes 1-4, 8 cm to foot with figure 8's, 10 cm to ankle and lower leg with heel locks, and 12 cm to knee, 2 12 cm to thigh, with 1/2 in gray foam at top to prevent sliding down.Marland Kitchen Pts wife wrapped left LE while I wrapped right with occasional VC's for proper technique    Manual Lymphatic Drainage (MLD) Short neck, left inguinal, 5 diaphragmatic breaths, left lateral  upper leg, medial to lateral thigh, and lateral thigh proximal to groin, followed by ant and posterior lower leg retracing steps except medial to lateral thigh and then foot retracing steps to groin.  Instructed pts wife in MLD and she repated same steps on right LE to right inguinal LN's with legs elevated.  Lotion applied to legs before wrapping                       PT Long Term Goals - 06/26/20 1238      PT LONG TERM GOAL #1   Title Pt will be able to tolerate compression bandaging for reduction of LE edema    Baseline legs very tender    Time 3    Period Weeks    Status New    Target Date 07/17/20      PT LONG TERM GOAL #2   Title Pt will note decreased bilateral redness and pain following wrapping by atleast 50%    Time 6    Period Weeks    Status New      PT LONG TERM GOAL #3   Title Pt will have decreased edema by 3-4 cm at 30 cm prox to floor bilaterally    Time 6    Period Weeks    Status New    Target Date 08/07/20      PT LONG TERM GOAL #4   Title Pt will be fit for and be able to wear compression garments to manage edema    Time 8    Period Weeks    Status New    Target Date 08/21/20                 Plan - 07/24/20 1717    Clinical Impression Statement Continued with MLD to bilateral LEs then applied compression bandaging at end of session. He is going in the morning to get measured for compression stockings in [place]. Sent pt with info on what to obtain.    PT Frequency 3x / week    PT Duration 8 weeks    PT Treatment/Interventions Therapeutic exercise;Patient/family education;Manual techniques;Compression bandaging;Manual lymph drainage    PT Next Visit Plan MLD,compression bandaging and shorts, instruct wife again in MLD /bandaging prn, pt wants to go to [place] on Thursday for stockings    Consulted and Agree with Plan of Care Patient           Patient will benefit from skilled therapeutic intervention in order to improve the  following  deficits and impairments:     Visit Diagnosis: Localized edema     Problem List Patient Active Problem List   Diagnosis Date Noted  . Drug-induced skin rash 07/15/2020  . Shortness of breath 03/12/2020  . Bilateral lower extremity edema 02/22/2020  . Port-A-Cath in place 04/25/2019  . Brain metastasis ([place]) 04/07/2019  . Malignant pleural effusion 03/29/2019  . Adenocarcinoma of right lung, stage 4 ([place]) 03/15/2019  . Encounter for antineoplastic chemotherapy 03/15/2019  . Encounter for antineoplastic immunotherapy 03/15/2019  . Goals of care, counseling/discussion 03/15/2019  . Recurrent right pleural effusion 03/15/2019  . Pleural effusion 02/20/2019  . Nonspecific abnormal electrocardiogram (ECG) (EKG) 02/20/2019  . Preop cardiovascular exam 02/20/2019  . Pulmonary nodule, right 02/16/2019  . Chronic cough 02/16/2019  . B12 deficiency 11/24/2018  . Neuropathy, peripheral 11/24/2018  . Migraines 06/14/2018  . Retinal defect, left 06/14/2018  . Borderline hypertension 11/04/2017    Allyson Sabal [facility] 07/24/2020, 5:18 PM  [place], [place], [address] Phone: [phone]   Fax:  [phone]  Name: Ethan Brown MRN: [id number] Date of Birth: 1951/03/31  Manus Gunning, PT 07/24/20 5:19 PM

## 2020-07-24 NOTE — Progress Notes (Signed)
[facility] OFFICE PROGRESS NOTE  Laurey Morale, MD [place] [address]  DIAGNOSIS: Stage IV (T2b, N2, M1c)presented with right middle lobe lung mass in addition to mediastinal lymphadenopathy as well as malignant right pleural effusion with pleural-based nodules and suspicious right hepatic lesion and the brain metastasis diagnosed in July 2020.  Molecular Biomarkers: [id number] (Exon 20 insertion) 0.2% Dacomitinib,Neratinib,Osimertinib  [id number] 0.1% None  PRIOR THERAPY: 1)SRS treatment to the metastatic disease to the brain under the care of Dr. Tammi Klippel. Completed on 04/07/2019. 2) SRS to the suspicious small brain lesion in the thalamocapsular region 01/23/2020 under the care of Dr. Tammi Klippel 3) Systemic chemotherapy with carboplatin for AUC of 5, Alimta 500 mg/M2 and Keytruda 200 mg IV every 3 weeks. First dose April 04, 2019.Status post21cycles.Starting from cycle #5 he has been onmaintenanceAlimta and Keytruda every 3 weeks. This was discontinued due to evidence for disease progression.  4) SRS to the metastatic brain lesions under the care of Dr. Tammi Klippel on 07/03/20  CURRENT THERAPY: Monocertinib (Axkibity) 354 mg p.o. daily. First dose on 06/29/20  INTERVAL HISTORY: Ethan Brown 69 y.o. male returns to the clinic today for a follow up visit. The patient recently had evidence for disease progression and his treatment was subsequently switched to oral chemotherapy with monocertinib. He started this on 06/29/20. At his last appointment, he had been endorsing skin rash, dry mouth, and intermittent diarrhea. He was given a prescription for hydrocortisone cream which has improved his his rash, which is primarily located on his cheeks and the back of his hands. He still has redness on his cheeks but it is significantly improved compared to his last appointment. He also has intermittent diarrhea 1-2x per week. He uses  imodium with fairly good control of his symptoms. He improvement in his dry mouth. He uses biotene.  He continues to see the lymphedema clinic for lower extremity swelling. His swelling in his lower extremities has significantly improved as well. He lost 10 lbs since his last appointment which may mostly be fluid weight. He states he is appetite has improved. He continues to have mild upper extremity swelling, but this is improving as well. He has a few more appointments at the lymphedema clinic. He met with a member of the nutritionist team at his last appointment for recommendations to increase his protein intake. His protein/albumin is improved compared to prior from today's lab work. He also started exercising again.   Otherwise, he denies any fevers, chills, or night sweats. He does note that he is cold at night. His cough is significantly improved compared to prior but he sometimes has a coughing spell when he talks a lot. He denies chest pain or hemoptysis except for chest discomfort in the location of the tumor when he coughs. He has a prescription for Tussionex as well as guaifenesin/codeine. He denies vomiting but reports mild nausea when he has an episode of diarrhea. He is here for evaluation and repeat blood work.   MEDICAL HISTORY: Past Medical History:  Diagnosis Date  . Borderline hypertension   . Brain metastasis ([facility]) dx'd 01/2019  . Detached vitreous humor, left   . Dyspnea   . ED (erectile dysfunction) of organic origin   . Hyperlipidemia   . Hypertension   . Lens subluxation, left   . Lung cancer ([facility]) dx'd 01/2019   Stage IV  . Migraine headache    takes PRN Imitrex  . Pseudophakia   . TGA (transient  global amnesia) 2017    ALLERGIES:  has No Known Allergies.  MEDICATIONS:  Current Outpatient Medications  Medication Sig Dispense Refill  . chlorpheniramine-HYDROcodone (TUSSIONEX) 10-8 MG/5ML SUER Take 5 mLs by mouth at bedtime as needed for cough. 140 mL 0  .  cyanocobalamin (,VITAMIN B-12,) 1000 MCG/ML injection INJECT 1 ML (1,000 MCG TOTAL) INTO THE MUSCLE ONCE FOR 1 DOSE. 5 mL 1  . guaiFENesin-codeine 100-10 MG/5ML syrup Take 10 mLs by mouth every 4 (four) hours as needed for cough. (Patient not taking: Reported on 07/15/2020) 240 mL 0  . hydrochlorothiazide (MICROZIDE) 12.5 MG capsule Take 1 capsule (12.5 mg total) by mouth 2 (two) times daily. 180 capsule 2  . hydrocortisone 1 % lotion Apply 1 application topically 2 (two) times daily. 113 g 0  . lidocaine-prilocaine (EMLA) cream Apply to the Port-A-Cath site 30-60 minutes before treatment 30 g 0  . LORazepam (ATIVAN) 1 MG tablet Take 1 tablet (1 mg total) by mouth as needed for anxiety (30 minutes prior to radiation treatment and may repeat just prior to procedure if needed.). (Patient not taking: Reported on 07/15/2020) 10 tablet 0  . magnesium oxide (MAG-OX) 400 (241.3 Mg) MG tablet Take 1 tablet (400 mg total) by mouth daily. 30 tablet 1  . Mobocertinib Succinate (EXKIVITY) 40 MG CAPS Take 160 mg by mouth every morning. 120 capsule 3  . Multiple Vitamin (MULTIVITAMIN WITH MINERALS) TABS tablet Take 1 tablet by mouth daily.    . prochlorperazine (COMPAZINE) 10 MG tablet Take 1 tablet (10 mg total) by mouth every 6 (six) hours as needed for nausea or vomiting. 30 tablet 1  . sildenafil (REVATIO) 20 MG tablet TAKE AS DIRECTED 10 tablet 11  . sulfamethoxazole-trimethoprim (BACTRIM DS) 800-160 MG tablet Take 1 tablet by mouth 2 (two) times daily. 14 tablet 0  . tamsulosin (FLOMAX) 0.4 MG CAPS capsule Take 0.4 mg by mouth daily.     No current facility-administered medications for this visit.    SURGICAL HISTORY:  Past Surgical History:  Procedure Laterality Date  . CAROTID DOPPLERS  09/2017   Mild non-obstructive disease  . CATARACT EXTRACTION, BILATERAL    . cataract, left  2018  . CHEST TUBE INSERTION Right 03/29/2019   Procedure: INSERTION PLEURAL DRAINAGE CATHETER;  Surgeon: [name], MD;  Location: [facility];  Service: Thoracic;  Laterality: Right;  . COLONOSCOPY  2016   clear, repeat in 10 yrs   . Eye surgeries     , Lens attachment.  Vitrectomy  . FEMORAL HERNIA REPAIR    . HERNIA REPAIR    . IR THORACENTESIS ASP PLEURAL SPACE W/IMG GUIDE  02/22/2019  . IR THORACENTESIS ASP PLEURAL SPACE W/IMG GUIDE  03/13/2019  . IR THORACENTESIS ASP PLEURAL SPACE W/IMG GUIDE  03/18/2020  . LUMBAR LAMINECTOMY    . PLEURAL BIOPSY Right 03/29/2019   Procedure: PLEURAL BIOPSY;  Surgeon: [name], MD;  Location: [facility];  Service: Thoracic;  Laterality: Right;  . PORTACATH PLACEMENT N/A 03/29/2019   Procedure: INSERTION PORT-A-CATH;  Surgeon: [name], MD;  Location: [facility];  Service: Thoracic;  Laterality: N/A;  . retinal attachment, right    . sclearl buckle, right    . victrectomy,right    . VIDEO ASSISTED THORACOSCOPY Right 03/29/2019   Procedure: VIDEO ASSISTED THORACOSCOPY;  Surgeon: [name], MD;  Location: [facility];  Service: Thoracic;  Laterality: Right;    REVIEW OF SYSTEMS:   Review of Systems  Constitutional:  Negative for appetite change, chills, fatigue, fever and unexpected weight change.  HENT: Negative for mouth sores, nosebleeds, sore throat and trouble swallowing.   Eyes: Negative for eye problems and icterus.  Respiratory: Positive for cough (improved).  Negative for hemoptysis and wheezing.   Cardiovascular: Positive for upper extremity swelling swelling (improved). Negative for chest pain. Lower extremity swelling improved. Gastrointestinal: Positive for intermittent diarrhea and mild nausea. Negative for abdominal pain, constipation, and vomiting.  Genitourinary: Negative for bladder incontinence, difficulty urinating, dysuria, frequency and hematuria.   Musculoskeletal: Negative for back pain, gait problem, neck pain and neck stiffness.  Skin: Improved but persistent erythema/flushing on cheeks.  Neurological: Negative for  dizziness, extremity weakness, gait problem, headaches, light-headedness and seizures.  Hematological: Negative for adenopathy. Does not bruise/bleed easily.  Psychiatric/Behavioral: Negative for confusion, depression and sleep disturbance. The patient is not nervous/anxious.     PHYSICAL EXAMINATION:  Blood pressure (!) 140/91, pulse 81, temperature 97.7 F (36.5 C), temperature source Tympanic, resp. rate 19, height '5\' 11"'  (1.803 m), weight 194 lb 4.8 oz (88.1 kg), SpO2 95 %.  ECOG PERFORMANCE STATUS: 1 - Symptomatic but completely ambulatory  Physical Exam  Constitutional: Oriented to person, place, and time and well-developed, well-nourished, and in no distress.  HENT:  Head: Normocephalic and atraumatic.  Mouth/Throat: Oropharynx is clear and moist. No oropharyngeal exudate.  Eyes: Conjunctivae are normal. Right eye exhibits no discharge. Left eye exhibits no discharge. No scleral icterus.  Neck: Normal range of motion. Neck supple.  Cardiovascular: Normal rate, regular rhythm, normal heart sounds and intact distal pulses.   Pulmonary/Chest: Effort normal and breath sounds normal. No respiratory distress. No wheezes. No rales.  Abdominal: Soft. Bowel sounds are normal. Exhibits no distension and no mass. There is no tenderness.  Musculoskeletal: Normal range of motion. No edema in lower extremities. Mild bilateral upper extremity swelling.  Lymphadenopathy:    No cervical adenopathy.  Neurological: Alert and oriented to person, place, and time. Exhibits normal muscle tone. Gait normal. Coordination normal.  Skin: Skin is warm and dry. Cheeks dry and erythematous (improved from prior). Not diaphoretic.  No pallor.  Psychiatric: Mood, memory and judgment normal.  Vitals reviewed.  LABORATORY DATA: Lab Results  Component Value Date   WBC 5.8 07/29/2020   HGB 11.1 (L) 07/29/2020   HCT 35.2 (L) 07/29/2020   MCV 94.6 07/29/2020   PLT 193 07/29/2020      Chemistry       Component Value Date/Time   NA 140 07/29/2020 1114   K 4.7 07/29/2020 1114   CL 107 07/29/2020 1114   CO2 28 07/29/2020 1114   BUN 25 (H) 07/29/2020 1114   CREATININE 1.05 07/29/2020 1114      Component Value Date/Time   CALCIUM 9.3 07/29/2020 1114   ALKPHOS 75 07/29/2020 1114   AST 22 07/29/2020 1114   ALT 20 07/29/2020 1114   BILITOT 0.2 (L) 07/29/2020 1114       RADIOGRAPHIC STUDIES:  No results found.   ASSESSMENT/PLAN:  This isa very pleasant52 year old Caucasian male who was diagnosed with stage IV non-small cell lung cancer, adenocarcinoma with a positive EGFR resistant mutation in exon 20.He may be eligible for targeted therapy in the future if he progresses on his current treatment.He presented with extensive disease involving the right upper lobe, right middle lobe, and right lower lobe with extensive pleural-based metastases. He also has mediastinal and hilar adenopathy and a rightmalignant pleural effusion. He also has metastatic disease to the brain  and a suspicious liver lesion.  He completed SRS treatment to the metastatic disease to the brain under care of Dr. Tammi Klippel.  He had a pleurex catheter placed under the care of Dr. Roxan Hockey for his malignant pleural effusion.This was removed 07/11/2019  He recently had [place] treatment to the another suspicious brain lesion on 01/23/20 under the care of Dr. Tammi Klippel.  He then underwent systsystemic chemotherapy with carboplatin AUC of 5, Alimta 500 mg/m, and Keytruda 200 mg IV every 3 weeks. He is status post20cycles. Starting from cycle #5, he has been on maintenance Alimta 500 mg/m2 and Keytruda 200 mg IV every 3 weeks.Beryle Flock was discontinued due topneumonitisstarting from cycle #17. This was discontinued after cycle #21 due to evidence for disease progression.   He is currently undergoing treatment with the new targeted agent Monocertinib (Axkibity) that was recently approved by the FDA for patient  with EGFR exon 20 mutation. He started this on 06/29/20. He has been tolerating fairly well except for skin rash, dry mouth, and mild diarrhea.   Labs were reviewed. Recommend that he continue on the same treatment at the same dose.   His magnesium is slightly low. I have sent a prescription to the pharmacy for magnesium oxide 400 mg daily.   I will arrange for a restaging CT scan of the chest, abdomen, and pelvis before his next appointment.   We will see him back for a follow up visit in 4 weeks for evaluation, to review his scan results, and repeat blood work.   He will continue to use hydrocortisone cream if needed for his rash.   His extremity swelling improved in the lower extremities. I encouraged him to discuss with the lymphedema clinic strategies for reducing swelling in his upper extremities. Would not advise lasix use at this time and discussed this with the patient. His albumin and total protein are continuing to improve.   The patient was advised to call immediately if he has any concerning symptoms in the interval. The patient voices understanding of current disease status and treatment options and is in agreement with the current care plan. All questions were answered. The patient knows to call the clinic with any problems, questions or concerns. We can certainly see the patient much sooner if necessary       Orders Placed This Encounter  Procedures  . CT Chest W Contrast    Standing Status:   Future    Standing Expiration Date:   07/29/2021    Order Specific Question:   If indicated for the ordered procedure, I authorize the administration of contrast media per Radiology protocol    Answer:   Yes    Order Specific Question:   Preferred imaging location?    Answer:   [facility]  . CT Abdomen Pelvis W Contrast    Standing Status:   Future    Standing Expiration Date:   07/29/2021    Order Specific Question:   If indicated for the ordered procedure, I authorize  the administration of contrast media per Radiology protocol    Answer:   Yes    Order Specific Question:   Preferred imaging location?    Answer:   [facility]    Order Specific Question:   Is Oral Contrast requested for this exam?    Answer:   Yes, Per Radiology protocol  . CBC with Differential (Cancer Center Only)    Standing Status:   Future    Standing Expiration Date:  07/29/2021  . CMP ([facility] only)    Standing Status:   Future    Standing Expiration Date:   07/29/2021     Branch Pacitti L Malahki Gasaway, PA-C 07/29/20

## 2020-07-25 ENCOUNTER — Encounter: Payer: Self-pay | Admitting: Medical

## 2020-07-26 ENCOUNTER — Other Ambulatory Visit: Payer: Self-pay | Admitting: Medical

## 2020-07-26 MED ORDER — SULFAMETHOXAZOLE-TRIMETHOPRIM 800-160 MG PO TABS: 1.0000 | ORAL_TABLET | Freq: Two times a day (BID) | ORAL | 0 refills | Status: DC

## 2020-07-29 ENCOUNTER — Encounter: Payer: Self-pay | Admitting: Internal Medicine

## 2020-07-29 ENCOUNTER — Inpatient Hospital Stay: Payer: Medicare Other

## 2020-07-29 ENCOUNTER — Encounter: Payer: Self-pay | Admitting: Physician Assistant

## 2020-07-29 ENCOUNTER — Inpatient Hospital Stay: Payer: Medicare Other | Attending: Internal Medicine | Admitting: Physician Assistant

## 2020-07-29 ENCOUNTER — Other Ambulatory Visit: Payer: Self-pay

## 2020-07-29 DIAGNOSIS — I10 Essential (primary) hypertension: Secondary | ICD-10-CM | POA: Insufficient documentation

## 2020-07-29 DIAGNOSIS — C3411 Malignant neoplasm of upper lobe, right bronchus or lung: Secondary | ICD-10-CM | POA: Insufficient documentation

## 2020-07-29 DIAGNOSIS — C787 Secondary malignant neoplasm of liver and intrahepatic bile duct: Secondary | ICD-10-CM | POA: Diagnosis not present

## 2020-07-29 DIAGNOSIS — C3491 Malignant neoplasm of unspecified part of right bronchus or lung: Secondary | ICD-10-CM

## 2020-07-29 DIAGNOSIS — Z79899 Other long term (current) drug therapy: Secondary | ICD-10-CM | POA: Diagnosis not present

## 2020-07-29 DIAGNOSIS — C7931 Secondary malignant neoplasm of brain: Secondary | ICD-10-CM | POA: Diagnosis not present

## 2020-07-29 DIAGNOSIS — E079 Disorder of thyroid, unspecified: Secondary | ICD-10-CM

## 2020-07-29 DIAGNOSIS — J910 Malignant pleural effusion: Secondary | ICD-10-CM | POA: Insufficient documentation

## 2020-07-29 DIAGNOSIS — E785 Hyperlipidemia, unspecified: Secondary | ICD-10-CM | POA: Diagnosis not present

## 2020-07-29 LAB — CMP (CANCER CENTER ONLY)
ALT: 20 U/L (ref 0–44)
AST: 22 U/L (ref 15–41)
Albumin: 2.7 g/dL — ABNORMAL LOW (ref 3.5–5.0)
Alkaline Phosphatase: 75 U/L (ref 38–126)
Anion gap: 5 (ref 5–15)
BUN: 25 mg/dL — ABNORMAL HIGH (ref 8–23)
CO2: 28 mmol/L (ref 22–32)
Calcium: 9.3 mg/dL (ref 8.9–10.3)
Chloride: 107 mmol/L (ref 98–111)
Creatinine: 1.05 mg/dL (ref 0.61–1.24)
GFR, Estimated: >60 mL/min (ref >60)
Glucose, Bld: 93 mg/dL (ref 70–99)
Potassium: 4.7 mmol/L (ref 3.5–5.1)
Sodium: 140 mmol/L (ref 135–145)
Total Bilirubin: 0.2 mg/dL — ABNORMAL LOW (ref 0.3–1.2)
Total Protein: 6.2 g/dL — ABNORMAL LOW (ref 6.5–8.1)

## 2020-07-29 LAB — CBC WITH DIFFERENTIAL (CANCER CENTER ONLY)
Abs Immature Granulocytes: 0.03 10*3/uL (ref 0.00–0.07)
Basophils Absolute: 0 10*3/uL (ref 0.0–0.1)
Basophils Relative: 1 %
Eosinophils Absolute: 0.2 10*3/uL (ref 0.0–0.5)
Eosinophils Relative: 3 %
HCT: 35.2 % — ABNORMAL LOW (ref 39.0–52.0)
Hemoglobin: 11.1 g/dL — ABNORMAL LOW (ref 13.0–17.0)
Immature Granulocytes: 1 %
Lymphocytes Relative: 18 %
Lymphs Abs: 1.1 10*3/uL (ref 0.7–4.0)
MCH: 29.8 pg (ref 26.0–34.0)
MCHC: 31.5 g/dL (ref 30.0–36.0)
MCV: 94.6 fL (ref 80.0–100.0)
Monocytes Absolute: 0.3 10*3/uL (ref 0.1–1.0)
Monocytes Relative: 6 %
Neutro Abs: 4.1 10*3/uL (ref 1.7–7.7)
Neutrophils Relative %: 71 %
Platelet Count: 193 10*3/uL (ref 150–400)
RBC: 3.72 MIL/uL — ABNORMAL LOW (ref 4.22–5.81)
RDW: 14 % (ref 11.5–15.5)
WBC Count: 5.8 10*3/uL (ref 4.0–10.5)
nRBC: 0 % (ref 0.0–0.2)

## 2020-07-29 LAB — TSH: TSH: 0.786 u[IU]/mL (ref 0.320–4.118)

## 2020-07-29 LAB — MAGNESIUM: Magnesium: 1.5 mg/dL — ABNORMAL LOW (ref 1.7–2.4)

## 2020-07-29 MED ORDER — MAGNESIUM OXIDE 400 (241.3 MG) MG PO TABS: 400.0000 mg | ORAL_TABLET | Freq: Every day | ORAL | 1 refills | Status: DC

## 2020-07-31 ENCOUNTER — Ambulatory Visit: Payer: Medicare Other | Admitting: Physical Therapy

## 2020-07-31 ENCOUNTER — Other Ambulatory Visit: Payer: Self-pay

## 2020-07-31 ENCOUNTER — Encounter: Payer: Self-pay | Admitting: Physical Therapy

## 2020-07-31 DIAGNOSIS — M79661 Pain in right lower leg: Secondary | ICD-10-CM | POA: Diagnosis not present

## 2020-07-31 DIAGNOSIS — R600 Localized edema: Secondary | ICD-10-CM

## 2020-07-31 DIAGNOSIS — M79662 Pain in left lower leg: Secondary | ICD-10-CM

## 2020-07-31 NOTE — Therapy (Signed)
[facility] [place], [place], [address] Phone: [phone]   Fax:  [phone]  Physical Therapy Treatment  Patient Details  Name: Ethan Brown MRN: [id number] Date of Birth: 1950/09/10 Referring Provider (PT): Tanner   Encounter Date: 07/31/2020   PT End of Session - 07/31/20 1649    Visit Number 9    Number of Visits 25    Date for PT Re-Evaluation 08/21/20    PT Start Time 0076    PT Stop Time 1650    PT Time Calculation (min) 45 min    Activity Tolerance Patient tolerated treatment well    Behavior During Therapy [facility] for tasks assessed/performed           Past Medical History:  Diagnosis Date  . Borderline hypertension   . Brain metastasis ([facility]) dx'd 01/2019  . Detached vitreous humor, left   . Dyspnea   . ED (erectile dysfunction) of organic origin   . Hyperlipidemia   . Hypertension   . Lens subluxation, left   . Lung cancer ([facility]) dx'd 01/2019   Stage IV  . Migraine headache    takes PRN Imitrex  . Pseudophakia   . TGA (transient global amnesia) 2017    Past Surgical History:  Procedure Laterality Date  . CAROTID DOPPLERS  09/2017   Mild non-obstructive disease  . CATARACT EXTRACTION, BILATERAL    . cataract, left  2018  . CHEST TUBE INSERTION Right 03/29/2019   Procedure: INSERTION PLEURAL DRAINAGE CATHETER;  Surgeon: [name], MD;  Location: [facility];  Service: Thoracic;  Laterality: Right;  . COLONOSCOPY  2016   clear, repeat in 10 yrs   . Eye surgeries     , Lens attachment.  Vitrectomy  . FEMORAL HERNIA REPAIR    . HERNIA REPAIR    . IR THORACENTESIS ASP PLEURAL SPACE W/IMG GUIDE  02/22/2019  . IR THORACENTESIS ASP PLEURAL SPACE W/IMG GUIDE  03/13/2019  . IR THORACENTESIS ASP PLEURAL SPACE W/IMG GUIDE  03/18/2020  . LUMBAR LAMINECTOMY    . PLEURAL BIOPSY Right 03/29/2019   Procedure: PLEURAL BIOPSY;  Surgeon: [name], MD;  Location: [facility];  Service:  Thoracic;  Laterality: Right;  . PORTACATH PLACEMENT N/A 03/29/2019   Procedure: INSERTION PORT-A-CATH;  Surgeon: [name], MD;  Location: [facility];  Service: Thoracic;  Laterality: N/A;  . retinal attachment, right    . sclearl buckle, right    . victrectomy,right    . VIDEO ASSISTED THORACOSCOPY Right 03/29/2019   Procedure: VIDEO ASSISTED THORACOSCOPY;  Surgeon: [name], MD;  Location: [facility];  Service: Thoracic;  Laterality: Right;    There were no vitals filed for this visit.   Subjective Assessment - 07/31/20 1606    Subjective I got my compression stocking and they work great.    Pertinent History Diagnosed February 10, 2019 with Lung CA stage 4.  Had surgery July or August 20/20, Started chemo shortly thereafter.  No longer having keytruda infusions or others because they aren't working.  Takes a tablet now that addresses his marker but it is very expensive/  Has had 2 radiation treatments on his brain and has 1 left.  Also has prostate CA, Had a PET scan a few weeks ago and has METS to his vertebrae.  Will have a multi D meeting next week with oncologist, radiologist, and urologist    Patient Stated Goals Decrease swelling in legs    Currently  in Pain? No/denies    Pain Score 0-No pain                 LYMPHEDEMA/ONCOLOGY QUESTIONNAIRE - 07/31/20 0001      Right Lower Extremity Lymphedema   20 cm Proximal to Suprapatella 48.5 cm    10 cm Proximal to Suprapatella 44.5 cm    At Midpatella/Popliteal Crease 37.5 cm    30 cm Proximal to Floor at Lateral Plantar Foot 33.8 cm    20 cm Proximal to Floor at Lateral Plantar Foot 24.'5 1    10 ' cm Proximal to Floor at Lateral Malleoli 23.4 cm    5 cm Proximal to 1st MTP Joint 23.8 cm    Across MTP Joint 25.5 cm    Around Proximal Great Toe 9.5 cm      Left Lower Extremity Lymphedema   20 cm Proximal to Suprapatella 48.5 cm    10 cm Proximal to Suprapatella 45 cm    At Midpatella/Popliteal Crease 38.4 cm    30 cm  Proximal to Floor at Lateral Plantar Foot 33 cm    20 cm Proximal to Floor at Lateral Plantar Foot 24.6 cm    10 cm Proximal to Floor at Lateral Malleoli 24.5 cm    5 cm Proximal to 1st MTP Joint 23.5 cm    Across MTP Joint 24.9 cm    Around Proximal Great Toe 9.5 cm                      [facility] Adult PT Treatment/Exercise - 07/31/20 0001      Manual Therapy   Edema Management remeasured circumferences- assessed fit of compression stockings and assist pt with donning/doffing                       PT Long Term Goals - 07/31/20 1647      PT LONG TERM GOAL #1   Title Pt will be able to tolerate compression bandaging for reduction of LE edema    Baseline legs very tender; 07/31/20- pt able to tolerate bandages    Time 3    Period Weeks    Status Achieved      PT LONG TERM GOAL #2   Title Pt will note decreased bilateral redness and pain following wrapping by atleast 50%    Baseline 07/31/20- nearly 100% improvement    Time 6    Period Weeks    Status Achieved      PT LONG TERM GOAL #3   Title Pt will have decreased edema by 3-4 cm at 30 cm prox to floor bilaterally    Time 6    Period Weeks    Status Achieved      PT LONG TERM GOAL #4   Title Pt will be fit for and be able to wear compression garments to manage edema    Baseline 07/31/20- pt received 3 sets of compression stockings and is managing his edema with them    Time 8    Period Weeks    Status Achieved                 Plan - 07/31/20 1652    Clinical Impression Statement Pt received his compression stockings - 20-30 mmHg thigh highs and has been wearing them since Thursday. Assessed fit of stocking and they fit well and seemed to contain his swelling well. Educated pt about donning gloves to assist with donning stockings,  Assessed pt's progress towards goals in therapy. His circumference measurements have decreased since 11/29. He has now met all goals for therapy and will be discharged  at this time since he can now independently manage his edema.    PT Frequency 3x / week    PT Duration 8 weeks    PT Treatment/Interventions Therapeutic exercise;Patient/family education;Manual techniques;Compression bandaging;Manual lymph drainage    PT Next Visit Plan d/c this visit    PT Home Exercise Plan continue diaphragmatic breathing, MLD by wife if possible, bandaging by his wife    Consulted and Agree with Plan of Care Patient           Patient will benefit from skilled therapeutic intervention in order to improve the following deficits and impairments:  Pain, Decreased skin integrity, Decreased knowledge of precautions, Increased edema  Visit Diagnosis: Localized edema  Pain in right lower leg  Pain in left lower leg     Problem List Patient Active Problem List   Diagnosis Date Noted  . Hypomagnesemia 07/29/2020  . Drug-induced skin rash 07/15/2020  . Shortness of breath 03/12/2020  . Bilateral lower extremity edema 02/22/2020  . Port-A-Cath in place 04/25/2019  . Brain metastasis ([facility]) 04/07/2019  . Malignant pleural effusion 03/29/2019  . Adenocarcinoma of right lung, stage 4 ([facility]) 03/15/2019  . Encounter for antineoplastic chemotherapy 03/15/2019  . Encounter for antineoplastic immunotherapy 03/15/2019  . Goals of care, counseling/discussion 03/15/2019  . Recurrent right pleural effusion 03/15/2019  . Pleural effusion 02/20/2019  . Nonspecific abnormal electrocardiogram (ECG) (EKG) 02/20/2019  . Preop cardiovascular exam 02/20/2019  . Pulmonary nodule, right 02/16/2019  . Chronic cough 02/16/2019  . B12 deficiency 11/24/2018  . Neuropathy, peripheral 11/24/2018  . Migraines 06/14/2018  . Retinal defect, left 06/14/2018  . Borderline hypertension 11/04/2017    Allyson Sabal [facility] 07/31/2020, 4:59 PM  [place], [place], [address] Phone: [phone]   Fax:   [phone]  Name: Devlon Dosher MRN: [id number] Date of Birth: Feb 01, 1951  PHYSICAL THERAPY DISCHARGE SUMMARY  Visits from Start of Care: 9  Current functional level related to goals / functional outcomes: All goals met   Remaining deficits: None   Education / Equipment: Self MLD, compression garments  Plan: Patient agrees to discharge.  Patient goals were met. Patient is being discharged due to meeting the stated rehab goals.  ?????    Allyson Sabal [place], [place] 07/31/20 4:59 PM

## 2020-08-01 ENCOUNTER — Telehealth: Payer: Self-pay | Admitting: Internal Medicine

## 2020-08-01 NOTE — Telephone Encounter (Signed)
Scheduled per los. Called and spoke with patient. Confirmed appt 

## 2020-08-02 ENCOUNTER — Encounter: Payer: Medicare Other | Admitting: Rehabilitation

## 2020-08-05 ENCOUNTER — Ambulatory Visit: Payer: Medicare Other | Admitting: Internal Medicine

## 2020-08-05 ENCOUNTER — Other Ambulatory Visit: Payer: Medicare Other

## 2020-08-05 ENCOUNTER — Ambulatory Visit: Payer: Medicare Other

## 2020-08-06 NOTE — Progress Notes (Signed)
  Radiation Oncology         [phone]) [phone] ________________________________  Name: Ethan Brown MRN: [id number]  Date: 01/23/2020  DOB: May 06, 1951  End of Treatment Note  Diagnosis:    69 yo gentleman with a new isolated solitary 2 mm brain metastasis     Indication for treatment:  Palliation       Radiation treatment dates:   01/23/20  Site/dose/beams/energy:   Right 2 mm thalamic target was treated using 3 Dynamic Conformal Arcs to a prescription dose of 20 Gy.  ExacTrac registration was performed for each couch angle.  The 100% isodose line was prescribed with 106.6% max hotspot.  6 MV X-rays were delivered in the flattening filter free beam mode.  Narrative: The patient tolerated radiation treatment relatively well.     Plan: The patient has completed radiation treatment. The patient will return to radiation oncology clinic for routine followup in one month. I advised him to call or return sooner if he has any questions or concerns related to his recovery or treatment. ________________________________  Sheral Apley. Tammi Klippel, M.D.

## 2020-08-10 ENCOUNTER — Other Ambulatory Visit: Payer: Self-pay | Admitting: Internal Medicine

## 2020-08-10 DIAGNOSIS — J189 Pneumonia, unspecified organism: Secondary | ICD-10-CM

## 2020-08-10 DIAGNOSIS — R059 Cough, unspecified: Secondary | ICD-10-CM

## 2020-08-10 DIAGNOSIS — R509 Fever, unspecified: Secondary | ICD-10-CM

## 2020-08-12 ENCOUNTER — Encounter: Payer: Self-pay | Admitting: Urology

## 2020-08-12 ENCOUNTER — Other Ambulatory Visit: Payer: Self-pay

## 2020-08-12 ENCOUNTER — Telehealth: Payer: Self-pay

## 2020-08-12 NOTE — Telephone Encounter (Signed)
Spoke with patient in regards to 1 month telephone follow-up with Freeman Caldron PA on 08/21/20@ 11:00am. Patient verbalized understanding of appointment date and time. Reviewed meaningful use questions. TM

## 2020-08-20 ENCOUNTER — Other Ambulatory Visit: Payer: Self-pay | Admitting: Physician Assistant

## 2020-08-21 ENCOUNTER — Ambulatory Visit
Admission: RE | Admit: 2020-08-21 | Discharge: 2020-08-21 | Disposition: A | Discharge status: Home / Self Care | Payer: Medicare Other | Source: Ambulatory Visit | Attending: Urology | Admitting: Urology

## 2020-08-21 ENCOUNTER — Other Ambulatory Visit: Payer: Self-pay

## 2020-08-21 DIAGNOSIS — C7931 Secondary malignant neoplasm of brain: Secondary | ICD-10-CM

## 2020-08-22 ENCOUNTER — Other Ambulatory Visit: Payer: Self-pay

## 2020-08-22 ENCOUNTER — Ambulatory Visit (HOSPITAL_COMMUNITY)
Admission: RE | Admit: 2020-08-22 | Discharge: 2020-08-22 | Disposition: A | Discharge status: Home / Self Care | Payer: Medicare Other | Source: Ambulatory Visit | Attending: Physician Assistant | Admitting: Physician Assistant

## 2020-08-22 ENCOUNTER — Encounter (HOSPITAL_COMMUNITY): Payer: Self-pay

## 2020-08-22 DIAGNOSIS — C3491 Malignant neoplasm of unspecified part of right bronchus or lung: Secondary | ICD-10-CM | POA: Insufficient documentation

## 2020-08-22 DIAGNOSIS — J90 Pleural effusion, not elsewhere classified: Secondary | ICD-10-CM | POA: Diagnosis not present

## 2020-08-22 DIAGNOSIS — J929 Pleural plaque without asbestos: Secondary | ICD-10-CM | POA: Diagnosis not present

## 2020-08-22 DIAGNOSIS — N3289 Other specified disorders of bladder: Secondary | ICD-10-CM | POA: Diagnosis not present

## 2020-08-22 DIAGNOSIS — N400 Enlarged prostate without lower urinary tract symptoms: Secondary | ICD-10-CM | POA: Diagnosis not present

## 2020-08-22 DIAGNOSIS — C7951 Secondary malignant neoplasm of bone: Secondary | ICD-10-CM | POA: Diagnosis not present

## 2020-08-22 DIAGNOSIS — N210 Calculus in bladder: Secondary | ICD-10-CM | POA: Diagnosis not present

## 2020-08-22 DIAGNOSIS — C3490 Malignant neoplasm of unspecified part of unspecified bronchus or lung: Secondary | ICD-10-CM | POA: Diagnosis not present

## 2020-08-22 MED ORDER — IOHEXOL 300 MG/ML  SOLN
100.0000 mL | Freq: Once | INTRAMUSCULAR | Status: AC | PRN
  Administered 2020-08-22: 100 mL via INTRAVENOUS

## 2020-08-22 NOTE — Addendum Note (Signed)
Encounter addended by: Kary Kos, MD on: 08/22/2020 11:14 AM  Actions taken: Clinical Note Signed

## 2020-08-22 NOTE — Progress Notes (Signed)
.  Radiation Oncology         [phone]) [phone] ________________________________  Name: Sudeep Scheibel MRN: [id number]  Date: 08/21/2020  DOB: 1950/11/10  Post Treatment Note  CC: Laurey Morale, MD  Curt Bears, MD  Diagnosis:   69 yo male with 2 new brain metastases from Stage IV (T2b, N2, M1c) adenocarcinoma of the right middle lobe of the lung.    Interval Since Last Radiation:  1 month 07/03/20: SRS//PTV3: 41mm right temporal gyrus target was treated to a prescription dose of 20 Gy in a single fraction.   SRS//PTV4: 1mm left periventricular target was treated to a prescription dose of 20 Gy in a single fraction.     01/23/2020: SRS//PTV2: 108mm right thalamocapsular target was treated to a prescription dose of 20 Gy in a single fraction.    04/07/19: SRS// PTV1:  6 mm  right parietal target was treated to a prescription dose of 20 Gy in a single fraction.    Narrative:  I spoke with the patient to conduct his routine scheduled 1 month follow up visit via telephone to spare the patient unnecessary potential exposure in the healthcare setting during the current COVID-19 pandemic.  The patient was notified in advance and gave permission to proceed with this visit format.   He has recovered well from the effects of his recent radiotherapy and is without complaints.     He was initially on systemic chemotherapy with carboplatin, Alimta and Keytruda every 3 weeks under the care and direction of Dr. Earlie Server and completed 4 cycles.  His restaging imaging showed a good response to treatment with a decrease in the right pleural effusion, decreased mediastinal and right hilar lymphadenopathy, and improvement in the liver lesion and right middle lobe pulmonary lesion so he was transitioned to maintenance therapy with Alimta and Keytruda, beginning with cycle 5 of his treatment on 06/27/2019. The Beryle Flock was discontinued after cycle 18 due to recurrent immunotherapy mediated pneumonitis.  Disease  restaging imaging with CT C/A/P from 02/09/20 showed continued disease stability so he proceeded with cycle #16 of maintenance treatment on 02/13/20.  He was on single agent Alimta beginning with cycle 19 of maintenance therapy but unfortunately, restaging scans in 05/2020 showed disease progression in the chest, abdomen and bones, confirmed on PET scan 06/18/20 as well as 2 new lesions in the brain, noted on follow up MRI brain scan from 06/20/20. The Alimta maintenance therapy was discontinued in 06/2020 and he was changed to Monocertinib (Axkibity) 785 mg p.o. daily which he is tolerating well.  He has also recently completed SRS to the 2 new brain lesions, performed on 07/03/20 and tolerated very well.  His visit today is to conduct his 1 month follow up post-treatment.            On review of systems, the patient states that he is doing very well overall.  He is currently without complaints and specifically denies headaches, changes in auditory or visual acuity, nausea, vomiting, dizziness, imbalance, tremors or seizure activity.  He has not had recent fevers, chills or night sweats.  He is tolerating his new systemic therapy quite well.  Overall, he is very pleased with his progress to date.  ALLERGIES:  has No Known Allergies.  Meds: Current Outpatient Medications  Medication Sig Dispense Refill  . cyanocobalamin (,VITAMIN B-12,) 1000 MCG/ML injection INJECT 1 ML (1,000 MCG TOTAL) INTO THE MUSCLE ONCE FOR 1 DOSE. 5 mL 1  . hydrochlorothiazide (MICROZIDE) 12.5 MG  capsule Take 1 capsule (12.5 mg total) by mouth 2 (two) times daily. 180 capsule 2  . hydrocortisone 1 % lotion Apply 1 application topically 2 (two) times daily. 113 g 0  . lidocaine-prilocaine (EMLA) cream Apply to the Port-A-Cath site 30-60 minutes before treatment 30 g 0  . Mobocertinib Succinate (EXKIVITY) 40 MG CAPS Take 160 mg by mouth every morning. 120 capsule 3  . Multiple Vitamin (MULTIVITAMIN WITH MINERALS) TABS tablet Take 1  tablet by mouth daily.    Marland Kitchen omeprazole (PRILOSEC OTC) 20 MG tablet Take 20 mg by mouth daily.    . prochlorperazine (COMPAZINE) 10 MG tablet Take 1 tablet (10 mg total) by mouth every 6 (six) hours as needed for nausea or vomiting. 30 tablet 1  . sildenafil (REVATIO) 20 MG tablet TAKE AS DIRECTED 10 tablet 11  . tamsulosin (FLOMAX) 0.4 MG CAPS capsule Take 0.4 mg by mouth daily.    . chlorpheniramine-HYDROcodone (TUSSIONEX) 10-8 MG/5ML SUER Take 5 mLs by mouth at bedtime as needed for cough. (Patient not taking: Reported on 08/12/2020) 140 mL 0  . guaiFENesin-codeine 100-10 MG/5ML syrup Take 10 mLs by mouth every 4 (four) hours as needed for cough. (Patient not taking: No sig reported) 240 mL 0  . LORazepam (ATIVAN) 1 MG tablet Take 1 tablet (1 mg total) by mouth as needed for anxiety (30 minutes prior to radiation treatment and may repeat just prior to procedure if needed.). (Patient not taking: No sig reported) 10 tablet 0  . magnesium oxide (MAG-OX) 400 MG tablet TAKE 1 TABLET BY MOUTH EVERY DAY 30 tablet 1  . sulfamethoxazole-trimethoprim (BACTRIM DS) 800-160 MG tablet Take 1 tablet by mouth 2 (two) times daily. (Patient not taking: Reported on 08/12/2020) 14 tablet 0   No current facility-administered medications for this encounter.    Physical Findings: Unable to assess due to telephone follow-up visit format.  Lab Findings: Lab Results  Component Value Date   WBC 5.8 07/29/2020   HGB 11.1 (L) 07/29/2020   HCT 35.2 (L) 07/29/2020   MCV 94.6 07/29/2020   PLT 193 07/29/2020     Radiographic Findings: CT Chest W Contrast  Result Date: 08/22/2020 CLINICAL DATA:  Primary Cancer Type: Lung Imaging Indication: Assess response to therapy Interval therapy since last imaging? Yes Initial Cancer Diagnosis Date: 03/29/2019; Established by: Biopsy-proven Detailed Pathology: Stage IV non-small cell lung cancer, adenocarcinoma. Primary Tumor location: Right middle lobe.  Brain metastasis.  Surgeries: Hernia repair.  Lumbar laminectomy. Chemotherapy: Yes; Ongoing?  Monocertinib daily Immunotherapy?  Yes; Type: Keytruda; Ongoing? No Radiation therapy? Yes; Date Range: 03/2019, 01/2020, 06/2020; Target: SRS to the metastatic brain lesions EXAM: CT CHEST, ABDOMEN, AND PELVIS WITH CONTRAST TECHNIQUE: Multidetector CT imaging of the chest, abdomen and pelvis was performed following the standard protocol during bolus administration of intravenous contrast. CONTRAST:  175mL OMNIPAQUE IOHEXOL 300 MG/ML  SOLN COMPARISON:  06/18/2020 PET-CT. CT chest, abdomen and pelvis 06/03/2020. FINDINGS: CT CHEST FINDINGS Cardiovascular: Normal heart size. No significant pericardial effusion/thickening. Right subclavian Port-A-Cath terminates in the middle third of the SVC. Great vessels are normal in course and caliber. No central pulmonary emboli. Mediastinum/Nodes: No discrete thyroid nodules. Unremarkable esophagus. No pathologically enlarged axillary, mediastinal or hilar lymph nodes. Lungs/Pleura: No pneumothorax. Small dependent left pleural effusion, mildly increased. Small to moderate loculated dependent right pleural effusion with associated right pleural thickening and enhancement, stable. Irregular right perihilar 2.1 x 2.1 cm nodule (series 6/image 74), previously 2.2 x 2.1 cm, not appreciably changed. Irregular  finely nodular thickening of minor fissure is mildly increased (5 mm thickness, previously 3 mm. Similar interlobular septal thickening throughout mid to lower right lung. New patchy ground-glass opacity in the basilar dependent right upper lobe and midportion of the right lower lobe. Peripheral right middle lobe 0.6 cm solid pulmonary nodule (series 6/image 83), stable. Platelike scarring versus atelectasis in the dependent basilar right lower lobe is unchanged. No new significant pulmonary nodules. Musculoskeletal: No aggressive appearing focal osseous lesions. Mild thoracic spondylosis. CT ABDOMEN  PELVIS FINDINGS Hepatobiliary: Normal liver with no liver mass. Normal gallbladder with no radiopaque cholelithiasis. No biliary ductal dilatation. Pancreas: Normal, with no mass or duct dilation. Spleen: Normal size. No mass. Adrenals/Urinary Tract: Normal adrenals. Normal kidneys with no hydronephrosis and no renal mass. Chronic diffuse bladder wall thickening with small bilateral bladder diverticula, largest 1.5 cm in the lateral left bladder wall. Multiple layering tiny bladder stones on the left, largest 3 mm. Stomach/Bowel: Normal non-distended stomach. Normal caliber small bowel with no small bowel wall thickening. Normal appendix. Right upper quadrant mobile cecum. Mild diffuse colonic diverticulosis, with no large bowel wall thickening or acute pericolonic fat stranding. Vascular/Lymphatic: Normal caliber abdominal aorta. Patent portal, splenic, hepatic and renal veins. No pathologically enlarged lymph nodes in the abdomen or pelvis. Reproductive: Vasectomy clips again noted bilaterally in the upper scrotum. Mildly enlarged prostate. Other: No pneumoperitoneum, ascites or focal fluid collection. Musculoskeletal: Sclerotic round 1.3 cm L1 vertebral lesion (series 5/image 86), increased from 0.7 cm on 06/03/2020 CT. Sclerotic 3.5 cm medial right iliac bone lesion (series 2/image 95), increased from 3.0 cm. No additional focal osseous lesions. Moderate lumbar spondylosis. IMPRESSION: 1. Interval growth of sclerotic osseous metastases in the L1 vertebral body and medial right iliac bone. 2. Irregular right perihilar nodule is stable. Irregular finely nodular thickening of the minor fissure is mildly increased, cannot exclude increasing pleural tumor. Similar interlobular septal thickening throughout the mid to lower right lung, potentially lymphangitic tumor. New patchy ground-glass opacity in the mid to lower right lung, nonspecific, suggest attention on follow-up. 3. Small to moderate loculated dependent  right pleural effusion, stable. Small dependent left pleural effusion, mildly increased. 4. Chronic diffuse bladder wall thickening with small bilateral bladder diverticula, suggesting chronic bladder outlet obstruction by the mildly enlarged prostate. Multiple layering tiny bladder stones. No hydronephrosis. 5. Mild diffuse colonic diverticulosis. Electronically Signed   By: Ilona Sorrel M.D.   On: 08/22/2020 11:42   CT Abdomen Pelvis W Contrast  Result Date: 08/22/2020 CLINICAL DATA:  Primary Cancer Type: Lung Imaging Indication: Assess response to therapy Interval therapy since last imaging? Yes Initial Cancer Diagnosis Date: 03/29/2019; Established by: Biopsy-proven Detailed Pathology: Stage IV non-small cell lung cancer, adenocarcinoma. Primary Tumor location: Right middle lobe.  Brain metastasis. Surgeries: Hernia repair.  Lumbar laminectomy. Chemotherapy: Yes; Ongoing?  Monocertinib daily Immunotherapy?  Yes; Type: Keytruda; Ongoing? No Radiation therapy? Yes; Date Range: 03/2019, 01/2020, 06/2020; Target: SRS to the metastatic brain lesions EXAM: CT CHEST, ABDOMEN, AND PELVIS WITH CONTRAST TECHNIQUE: Multidetector CT imaging of the chest, abdomen and pelvis was performed following the standard protocol during bolus administration of intravenous contrast. CONTRAST:  179mL OMNIPAQUE IOHEXOL 300 MG/ML  SOLN COMPARISON:  06/18/2020 PET-CT. CT chest, abdomen and pelvis 06/03/2020. FINDINGS: CT CHEST FINDINGS Cardiovascular: Normal heart size. No significant pericardial effusion/thickening. Right subclavian Port-A-Cath terminates in the middle third of the SVC. Great vessels are normal in course and caliber. No central pulmonary emboli. Mediastinum/Nodes: No discrete thyroid nodules. Unremarkable esophagus. No  pathologically enlarged axillary, mediastinal or hilar lymph nodes. Lungs/Pleura: No pneumothorax. Small dependent left pleural effusion, mildly increased. Small to moderate loculated dependent right  pleural effusion with associated right pleural thickening and enhancement, stable. Irregular right perihilar 2.1 x 2.1 cm nodule (series 6/image 74), previously 2.2 x 2.1 cm, not appreciably changed. Irregular finely nodular thickening of minor fissure is mildly increased (5 mm thickness, previously 3 mm. Similar interlobular septal thickening throughout mid to lower right lung. New patchy ground-glass opacity in the basilar dependent right upper lobe and midportion of the right lower lobe. Peripheral right middle lobe 0.6 cm solid pulmonary nodule (series 6/image 83), stable. Platelike scarring versus atelectasis in the dependent basilar right lower lobe is unchanged. No new significant pulmonary nodules. Musculoskeletal: No aggressive appearing focal osseous lesions. Mild thoracic spondylosis. CT ABDOMEN PELVIS FINDINGS Hepatobiliary: Normal liver with no liver mass. Normal gallbladder with no radiopaque cholelithiasis. No biliary ductal dilatation. Pancreas: Normal, with no mass or duct dilation. Spleen: Normal size. No mass. Adrenals/Urinary Tract: Normal adrenals. Normal kidneys with no hydronephrosis and no renal mass. Chronic diffuse bladder wall thickening with small bilateral bladder diverticula, largest 1.5 cm in the lateral left bladder wall. Multiple layering tiny bladder stones on the left, largest 3 mm. Stomach/Bowel: Normal non-distended stomach. Normal caliber small bowel with no small bowel wall thickening. Normal appendix. Right upper quadrant mobile cecum. Mild diffuse colonic diverticulosis, with no large bowel wall thickening or acute pericolonic fat stranding. Vascular/Lymphatic: Normal caliber abdominal aorta. Patent portal, splenic, hepatic and renal veins. No pathologically enlarged lymph nodes in the abdomen or pelvis. Reproductive: Vasectomy clips again noted bilaterally in the upper scrotum. Mildly enlarged prostate. Other: No pneumoperitoneum, ascites or focal fluid collection.  Musculoskeletal: Sclerotic round 1.3 cm L1 vertebral lesion (series 5/image 86), increased from 0.7 cm on 06/03/2020 CT. Sclerotic 3.5 cm medial right iliac bone lesion (series 2/image 95), increased from 3.0 cm. No additional focal osseous lesions. Moderate lumbar spondylosis. IMPRESSION: 1. Interval growth of sclerotic osseous metastases in the L1 vertebral body and medial right iliac bone. 2. Irregular right perihilar nodule is stable. Irregular finely nodular thickening of the minor fissure is mildly increased, cannot exclude increasing pleural tumor. Similar interlobular septal thickening throughout the mid to lower right lung, potentially lymphangitic tumor. New patchy ground-glass opacity in the mid to lower right lung, nonspecific, suggest attention on follow-up. 3. Small to moderate loculated dependent right pleural effusion, stable. Small dependent left pleural effusion, mildly increased. 4. Chronic diffuse bladder wall thickening with small bilateral bladder diverticula, suggesting chronic bladder outlet obstruction by the mildly enlarged prostate. Multiple layering tiny bladder stones. No hydronephrosis. 5. Mild diffuse colonic diverticulosis. Electronically Signed   By: Ilona Sorrel M.D.   On: 08/22/2020 11:42    Impression/Plan: 64. 69 yo man with 2 new brain metastases from Stage IV (T2b, N2, M1c) adenocarcinoma of the right middle lobe of the lung.    He appears to have recovered well from the effects of his recent [facility] treatment and is currently without complaints.  We discussed the plan for a repeat MRI brain scan in approximately 2 months to assess treatment response and I will plan to call him with those results and recommendations from brain tumor board.  Pending this scan is stable, we will plan to resume serial brain MRI scans every 3 months to monitor for any disease progression or recurrence with a telephone follow-up after each scan.  He is comfortable and in agreement with the stated  plan.  He knows to call at anytime in the interim with any questions or concerns.    Nicholos Johns, PA-C

## 2020-08-22 NOTE — Procedures (Signed)
  Name: Chrles Selley  MRN: [id number]  Date: 07/03/2020   DOB: 07-30-1951  Stereotactic Radiosurgery Operative Note  PRE-OPERATIVE DIAGNOSIS:  Multiple Brain Metastases  POST-OPERATIVE DIAGNOSIS:  Multiple Brain Metastases  PROCEDURE:  Stereotactic Radiosurgery  SURGEON:  Elaina Hoops, MD  NARRATIVE: The patient underwent a radiation treatment planning session in the radiation oncology simulation suite under the care of the radiation oncology physician and physicist.  I participated closely in the radiation treatment planning afterwards. The patient underwent planning CT which was fused to 3T high resolution MRI with 1 mm axial slices.  These images were fused on the planning system.  We contoured the gross target volumes and subsequently expanded this to yield the Planning Target Volume. I actively participated in the planning process.  I helped to define and review the target contours and also the contours of the optic pathway, eyes, brainstem and selected nearby organs at risk.  All the dose constraints for critical structures were reviewed and compared to AAPM Task Group 101.  The prescription dose conformity was reviewed.  I approved the plan electronically.    Accordingly, [name] was brought to the TrueBeam stereotactic radiation treatment linac and placed in the custom immobilization mask.  The patient was aligned according to the IR fiducial markers with BrainLab Exactrac, then orthogonal x-rays were used in ExacTrac with the 6DOF robotic table and the shifts were made to align the patient  [name] received stereotactic radiosurgery uneventfully.    Lesions treated:  2   Complex lesions treated:  0 (>3.5 cm, <60mm of optic path, or within the brainstem)   The detailed description of the procedure is recorded in the radiation oncology procedure note.  I was present for the duration of the procedure.  DISPOSITION:  Following delivery, the patient was  transported to nursing in stable condition and monitored for possible acute effects to be discharged to home in stable condition with follow-up in one month.  Elaina Hoops, MD 08/22/2020 10:59 AM

## 2020-08-26 ENCOUNTER — Other Ambulatory Visit: Payer: Self-pay

## 2020-08-26 ENCOUNTER — Inpatient Hospital Stay: Payer: Medicare Other

## 2020-08-26 ENCOUNTER — Inpatient Hospital Stay: Payer: Medicare Other | Attending: Internal Medicine

## 2020-08-26 ENCOUNTER — Encounter: Payer: Self-pay | Admitting: Cardiology

## 2020-08-26 ENCOUNTER — Ambulatory Visit (INDEPENDENT_AMBULATORY_CARE_PROVIDER_SITE_OTHER): Payer: Medicare Other | Admitting: Cardiology

## 2020-08-26 ENCOUNTER — Inpatient Hospital Stay (HOSPITAL_BASED_OUTPATIENT_CLINIC_OR_DEPARTMENT_OTHER): Payer: Medicare Other | Admitting: Internal Medicine

## 2020-08-26 VITALS — BP 141/79 | HR 71 | Temp 97.8°F | Resp 18 | Ht 71.0 in | Wt 197.8 lb

## 2020-08-26 VITALS — BP 115/64 | HR 76 | Ht 71.0 in | Wt 198.0 lb

## 2020-08-26 DIAGNOSIS — R600 Localized edema: Secondary | ICD-10-CM

## 2020-08-26 DIAGNOSIS — Z79899 Other long term (current) drug therapy: Secondary | ICD-10-CM | POA: Diagnosis not present

## 2020-08-26 DIAGNOSIS — C3491 Malignant neoplasm of unspecified part of right bronchus or lung: Secondary | ICD-10-CM

## 2020-08-26 DIAGNOSIS — E785 Hyperlipidemia, unspecified: Secondary | ICD-10-CM | POA: Insufficient documentation

## 2020-08-26 DIAGNOSIS — I452 Bifascicular block: Secondary | ICD-10-CM | POA: Diagnosis not present

## 2020-08-26 DIAGNOSIS — R030 Elevated blood-pressure reading, without diagnosis of hypertension: Secondary | ICD-10-CM | POA: Diagnosis not present

## 2020-08-26 DIAGNOSIS — Z9221 Personal history of antineoplastic chemotherapy: Secondary | ICD-10-CM | POA: Insufficient documentation

## 2020-08-26 DIAGNOSIS — R0602 Shortness of breath: Secondary | ICD-10-CM

## 2020-08-26 DIAGNOSIS — C342 Malignant neoplasm of middle lobe, bronchus or lung: Secondary | ICD-10-CM | POA: Insufficient documentation

## 2020-08-26 DIAGNOSIS — Z95828 Presence of other vascular implants and grafts: Secondary | ICD-10-CM

## 2020-08-26 DIAGNOSIS — C7931 Secondary malignant neoplasm of brain: Secondary | ICD-10-CM | POA: Diagnosis not present

## 2020-08-26 DIAGNOSIS — Z5111 Encounter for antineoplastic chemotherapy: Secondary | ICD-10-CM | POA: Diagnosis not present

## 2020-08-26 DIAGNOSIS — Z923 Personal history of irradiation: Secondary | ICD-10-CM | POA: Diagnosis not present

## 2020-08-26 DIAGNOSIS — I10 Essential (primary) hypertension: Secondary | ICD-10-CM | POA: Insufficient documentation

## 2020-08-26 DIAGNOSIS — J910 Malignant pleural effusion: Secondary | ICD-10-CM | POA: Insufficient documentation

## 2020-08-26 LAB — CBC WITH DIFFERENTIAL (CANCER CENTER ONLY)
Abs Immature Granulocytes: 0.01 10*3/uL (ref 0.00–0.07)
Basophils Absolute: 0 10*3/uL (ref 0.0–0.1)
Basophils Relative: 0 %
Eosinophils Absolute: 0.2 10*3/uL (ref 0.0–0.5)
Eosinophils Relative: 4 %
HCT: 34.2 % — ABNORMAL LOW (ref 39.0–52.0)
Hemoglobin: 11.2 g/dL — ABNORMAL LOW (ref 13.0–17.0)
Immature Granulocytes: 0 %
Lymphocytes Relative: 20 %
Lymphs Abs: 0.9 10*3/uL (ref 0.7–4.0)
MCH: 29.4 pg (ref 26.0–34.0)
MCHC: 32.7 g/dL (ref 30.0–36.0)
MCV: 89.8 fL (ref 80.0–100.0)
Monocytes Absolute: 0.3 10*3/uL (ref 0.1–1.0)
Monocytes Relative: 8 %
Neutro Abs: 3 10*3/uL (ref 1.7–7.7)
Neutrophils Relative %: 68 %
Platelet Count: 179 10*3/uL (ref 150–400)
RBC: 3.81 MIL/uL — ABNORMAL LOW (ref 4.22–5.81)
RDW: 14.1 % (ref 11.5–15.5)
WBC Count: 4.4 10*3/uL (ref 4.0–10.5)
nRBC: 0 % (ref 0.0–0.2)

## 2020-08-26 LAB — CMP (CANCER CENTER ONLY)
ALT: 36 U/L (ref 0–44)
AST: 29 U/L (ref 15–41)
Albumin: 2.9 g/dL — ABNORMAL LOW (ref 3.5–5.0)
Alkaline Phosphatase: 81 U/L (ref 38–126)
Anion gap: 3 — ABNORMAL LOW (ref 5–15)
BUN: 25 mg/dL — ABNORMAL HIGH (ref 8–23)
CO2: 27 mmol/L (ref 22–32)
Calcium: 8.8 mg/dL — ABNORMAL LOW (ref 8.9–10.3)
Chloride: 109 mmol/L (ref 98–111)
Creatinine: 1.19 mg/dL (ref 0.61–1.24)
GFR, Estimated: >60 mL/min (ref >60)
Glucose, Bld: 93 mg/dL (ref 70–99)
Potassium: 4.1 mmol/L (ref 3.5–5.1)
Sodium: 139 mmol/L (ref 135–145)
Total Bilirubin: 0.3 mg/dL (ref 0.3–1.2)
Total Protein: 6.2 g/dL — ABNORMAL LOW (ref 6.5–8.1)

## 2020-08-26 MED ORDER — SODIUM CHLORIDE 0.9% FLUSH
10.0000 mL | INTRAVENOUS | Status: DC | PRN
  Administered 2020-08-26: 10 mL
  Filled 2020-08-26: qty 10

## 2020-08-26 MED ORDER — HEPARIN SOD (PORK) LOCK FLUSH 100 UNIT/ML IV SOLN
500.0000 [IU] | Freq: Once | INTRAVENOUS | Status: AC | PRN
  Administered 2020-08-26: 500 [IU]
  Filled 2020-08-26: qty 5

## 2020-08-26 NOTE — Patient Instructions (Signed)

## 2020-08-26 NOTE — Progress Notes (Signed)
[facility] [phone]   [phone]  OFFICE PROGRESS NOTE  Laurey Morale, MD [place] [address]  DIAGNOSIS: Stage IV (T2b, N2, M1c) presented with right middle lobe lung mass in addition to mediastinal lymphadenopathy as well as malignant right pleural effusion with pleural-based nodules and suspicious right hepatic lesion and the brain metastasis diagnosed in July 2020.  Molecular Biomarkers: [id number] (Exon 20 insertion) 0.2% Dacomitinib,Neratinib,Osimertinib  [id number] 0.1% None   PRIOR THERAPY: Systemic chemotherapy with carboplatin for AUC of 5, Alimta 500 mg/M2 and Keytruda 200 mg IV every 3 weeks.  First dose April 04, 2019.  Status post 21 cycles  Starting from cycle #5 the patient is on maintenance treatment with Alimta and Keytruda every 3 weeks.  Starting from cycle #19 he will be on single agent Alimta.  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  CURRENT THERAPY: Monocertinib (Axkibity) 355 mg p.o. daily.  First dose was June 29, 2020.  Status post 2 months of treatment.   INTERVAL HISTORY: Ethan Brown 70 y.o. male returns to the clinic today for follow-up visit.  The patient is feeling fine today with no concerning complaints. He started having mild alopecia.  The patient denied having any chest pain, shortness of breath, cough or hemoptysis.  He denied having any fever or chills.  He has no nausea, vomiting, diarrhea or constipation.  He notes a significant improvement in her breathing after starting treatment with Monocertinib (Axkibity).  He has mild skin rash.  He denied having any headache or visual changes.  The patient had repeat CT scan of the chest, abdomen pelvis performed recently and is here for evaluation and discussion of his scan results.  MEDICAL HISTORY: Past Medical History:  Diagnosis Date  . Borderline hypertension   . Brain  metastasis ([facility]) dx'd 01/2019  . Detached vitreous humor, left   . Dyspnea   . ED (erectile dysfunction) of organic origin   . Hyperlipidemia   . Hypertension   . Lens subluxation, left   . Lung cancer ([facility]) dx'd 01/2019   Stage IV  . Migraine headache    takes PRN Imitrex  . Pseudophakia   . TGA (transient global amnesia) 2017    ALLERGIES:  has No Known Allergies.  MEDICATIONS:  Current Outpatient Medications  Medication Sig Dispense Refill  . cyanocobalamin (,VITAMIN B-12,) 1000 MCG/ML injection INJECT 1 ML (1,000 MCG TOTAL) INTO THE MUSCLE ONCE FOR 1 DOSE. 5 mL 1  . hydrochlorothiazide (MICROZIDE) 12.5 MG capsule Take 1 capsule (12.5 mg total) by mouth 2 (two) times daily. 180 capsule 2  . hydrocortisone 1 % lotion Apply 1 application topically 2 (two) times daily. 113 g 0  . lidocaine-prilocaine (EMLA) cream Apply to the Port-A-Cath site 30-60 minutes before treatment 30 g 0  . LORazepam (ATIVAN) 1 MG tablet Take 1 tablet (1 mg total) by mouth as needed for anxiety (30 minutes prior to radiation treatment and may repeat just prior to procedure if needed.). 10 tablet 0  . magnesium oxide (MAG-OX) 400 MG tablet TAKE 1 TABLET BY MOUTH EVERY DAY 30 tablet 1  . Mobocertinib Succinate (EXKIVITY) 40 MG CAPS Take 160 mg by mouth every morning. 120 capsule 3  . Multiple Vitamin (MULTIVITAMIN WITH MINERALS) TABS tablet Take 1 tablet by mouth daily.    Marland Kitchen omeprazole (PRILOSEC OTC) 20 MG tablet Take 20 mg by mouth daily.    . prochlorperazine (COMPAZINE)  10 MG tablet Take 1 tablet (10 mg total) by mouth every 6 (six) hours as needed for nausea or vomiting. 30 tablet 1  . sildenafil (REVATIO) 20 MG tablet TAKE AS DIRECTED 10 tablet 11  . sulfamethoxazole-trimethoprim (BACTRIM DS) 800-160 MG tablet Take 1 tablet by mouth 2 (two) times daily. 14 tablet 0  . tamsulosin (FLOMAX) 0.4 MG CAPS capsule Take 0.4 mg by mouth daily.     No current facility-administered medications for this visit.     SURGICAL HISTORY:  Past Surgical History:  Procedure Laterality Date  . CAROTID DOPPLERS  09/2017   Mild non-obstructive disease  . CATARACT EXTRACTION, BILATERAL    . cataract, left  2018  . CHEST TUBE INSERTION Right 03/29/2019   Procedure: INSERTION PLEURAL DRAINAGE CATHETER;  Surgeon: [name], MD;  Location: [facility];  Service: Thoracic;  Laterality: Right;  . COLONOSCOPY  2016   clear, repeat in 10 yrs   . Eye surgeries     , Lens attachment.  Vitrectomy  . FEMORAL HERNIA REPAIR    . HERNIA REPAIR    . IR THORACENTESIS ASP PLEURAL SPACE W/IMG GUIDE  02/22/2019  . IR THORACENTESIS ASP PLEURAL SPACE W/IMG GUIDE  03/13/2019  . IR THORACENTESIS ASP PLEURAL SPACE W/IMG GUIDE  03/18/2020  . LUMBAR LAMINECTOMY    . PLEURAL BIOPSY Right 03/29/2019   Procedure: PLEURAL BIOPSY;  Surgeon: [name], MD;  Location: [facility];  Service: Thoracic;  Laterality: Right;  . PORTACATH PLACEMENT N/A 03/29/2019   Procedure: INSERTION PORT-A-CATH;  Surgeon: [name], MD;  Location: [facility];  Service: Thoracic;  Laterality: N/A;  . retinal attachment, right    . sclearl buckle, right    . TRANSTHORACIC ECHOCARDIOGRAM  12/14/2018   EF 50 EF 55% (low normal).  Abnormal septal wall motion related to BBB; indeterminate diastolic parameters.  Mild RV enlargement with mildly reduced function.  Normal valves.  Normal atrial sizes.  . victrectomy,right    . VIDEO ASSISTED THORACOSCOPY Right 03/29/2019   Procedure: VIDEO ASSISTED THORACOSCOPY;  Surgeon: [name], MD;  Location: [facility];  Service: Thoracic;  Laterality: Right;    REVIEW OF SYSTEMS:  Constitutional: negative Eyes: negative Ears, nose, mouth, throat, and face: negative Respiratory: negative Cardiovascular: negative Gastrointestinal: negative Genitourinary:negative Integument/breast: negative Hematologic/lymphatic: negative Musculoskeletal:negative Neurological: negative Behavioral/Psych:  negative Endocrine: negative Allergic/Immunologic: negative   PHYSICAL EXAMINATION: General appearance: alert, cooperative, fatigued and no distress Head: Normocephalic, without obvious abnormality, atraumatic Neck: no adenopathy, no JVD, supple, symmetrical, trachea midline and thyroid not enlarged, symmetric, no tenderness/mass/nodules Lymph nodes: Cervical, supraclavicular, and axillary nodes normal. Resp: clear to auscultation bilaterally Back: symmetric, no curvature. ROM normal. No CVA tenderness. Cardio: regular rate and rhythm, S1, S2 normal, no murmur, click, rub or gallop GI: soft, non-tender; bowel sounds normal; no masses,  no organomegaly Extremities: extremities normal, atraumatic, no cyanosis or edema Neurologic: Alert and oriented X 3, normal strength and tone. Normal symmetric reflexes. Normal coordination and gait  ECOG PERFORMANCE STATUS: 1 - Symptomatic but completely ambulatory  Blood pressure (!) 141/79, pulse 71, temperature 97.8 F (36.6 C), temperature source Tympanic, resp. rate 18, height _0  (1.803 m), weight 197 lb 12.8 oz (89.7 kg), SpO2 99 %.  LABORATORY DATA: Lab Results  Component Value Date   WBC 4.4 08/26/2020   HGB 11.2 (L) 08/26/2020   HCT 34.2 (L) 08/26/2020   MCV 89.8 08/26/2020   PLT 179 08/26/2020      Chemistry  Component Value Date/Time   NA 140 07/29/2020 1114   K 4.7 07/29/2020 1114   CL 107 07/29/2020 1114   CO2 28 07/29/2020 1114   BUN 25 (H) 07/29/2020 1114   CREATININE 1.05 07/29/2020 1114      Component Value Date/Time   CALCIUM 9.3 07/29/2020 1114   ALKPHOS 75 07/29/2020 1114   AST 22 07/29/2020 1114   ALT 20 07/29/2020 1114   BILITOT 0.2 (L) 07/29/2020 1114       RADIOGRAPHIC STUDIES: CT Chest W Contrast  Result Date: 08/22/2020 CLINICAL DATA:  Primary Cancer Type: Lung Imaging Indication: Assess response to therapy Interval therapy since last imaging? Yes Initial Cancer Diagnosis Date: 03/29/2019;  Established by: Biopsy-proven Detailed Pathology: Stage IV non-small cell lung cancer, adenocarcinoma. Primary Tumor location: Right middle lobe.  Brain metastasis. Surgeries: Hernia repair.  Lumbar laminectomy. Chemotherapy: Yes; Ongoing?  Monocertinib daily Immunotherapy?  Yes; Type: Keytruda; Ongoing? No Radiation therapy? Yes; Date Range: 03/2019, 01/2020, 06/2020; Target: SRS to the metastatic brain lesions EXAM: CT CHEST, ABDOMEN, AND PELVIS WITH CONTRAST TECHNIQUE: Multidetector CT imaging of the chest, abdomen and pelvis was performed following the standard protocol during bolus administration of intravenous contrast. CONTRAST:  131m OMNIPAQUE IOHEXOL 300 MG/ML  SOLN COMPARISON:  06/18/2020 PET-CT. CT chest, abdomen and pelvis 06/03/2020. FINDINGS: CT CHEST FINDINGS Cardiovascular: Normal heart size. No significant pericardial effusion/thickening. Right subclavian Port-A-Cath terminates in the middle third of the SVC. Great vessels are normal in course and caliber. No central pulmonary emboli. Mediastinum/Nodes: No discrete thyroid nodules. Unremarkable esophagus. No pathologically enlarged axillary, mediastinal or hilar lymph nodes. Lungs/Pleura: No pneumothorax. Small dependent left pleural effusion, mildly increased. Small to moderate loculated dependent right pleural effusion with associated right pleural thickening and enhancement, stable. Irregular right perihilar 2.1 x 2.1 cm nodule (series 6/image 74), previously 2.2 x 2.1 cm, not appreciably changed. Irregular finely nodular thickening of minor fissure is mildly increased (5 mm thickness, previously 3 mm. Similar interlobular septal thickening throughout mid to lower right lung. New patchy ground-glass opacity in the basilar dependent right upper lobe and midportion of the right lower lobe. Peripheral right middle lobe 0.6 cm solid pulmonary nodule (series 6/image 83), stable. Platelike scarring versus atelectasis in the dependent basilar right  lower lobe is unchanged. No new significant pulmonary nodules. Musculoskeletal: No aggressive appearing focal osseous lesions. Mild thoracic spondylosis. CT ABDOMEN PELVIS FINDINGS Hepatobiliary: Normal liver with no liver mass. Normal gallbladder with no radiopaque cholelithiasis. No biliary ductal dilatation. Pancreas: Normal, with no mass or duct dilation. Spleen: Normal size. No mass. Adrenals/Urinary Tract: Normal adrenals. Normal kidneys with no hydronephrosis and no renal mass. Chronic diffuse bladder wall thickening with small bilateral bladder diverticula, largest 1.5 cm in the lateral left bladder wall. Multiple layering tiny bladder stones on the left, largest 3 mm. Stomach/Bowel: Normal non-distended stomach. Normal caliber small bowel with no small bowel wall thickening. Normal appendix. Right upper quadrant mobile cecum. Mild diffuse colonic diverticulosis, with no large bowel wall thickening or acute pericolonic fat stranding. Vascular/Lymphatic: Normal caliber abdominal aorta. Patent portal, splenic, hepatic and renal veins. No pathologically enlarged lymph nodes in the abdomen or pelvis. Reproductive: Vasectomy clips again noted bilaterally in the upper scrotum. Mildly enlarged prostate. Other: No pneumoperitoneum, ascites or focal fluid collection. Musculoskeletal: Sclerotic round 1.3 cm L1 vertebral lesion (series 5/image 86), increased from 0.7 cm on 06/03/2020 CT. Sclerotic 3.5 cm medial right iliac bone lesion (series 2/image 95), increased from 3.0 cm. No additional focal osseous lesions.  Moderate lumbar spondylosis. IMPRESSION: 1. Interval growth of sclerotic osseous metastases in the L1 vertebral body and medial right iliac bone. 2. Irregular right perihilar nodule is stable. Irregular finely nodular thickening of the minor fissure is mildly increased, cannot exclude increasing pleural tumor. Similar interlobular septal thickening throughout the mid to lower right lung, potentially  lymphangitic tumor. New patchy ground-glass opacity in the mid to lower right lung, nonspecific, suggest attention on follow-up. 3. Small to moderate loculated dependent right pleural effusion, stable. Small dependent left pleural effusion, mildly increased. 4. Chronic diffuse bladder wall thickening with small bilateral bladder diverticula, suggesting chronic bladder outlet obstruction by the mildly enlarged prostate. Multiple layering tiny bladder stones. No hydronephrosis. 5. Mild diffuse colonic diverticulosis. Electronically Signed   By: Ilona Sorrel M.D.   On: 08/22/2020 11:42   CT Abdomen Pelvis W Contrast  Result Date: 08/22/2020 CLINICAL DATA:  Primary Cancer Type: Lung Imaging Indication: Assess response to therapy Interval therapy since last imaging? Yes Initial Cancer Diagnosis Date: 03/29/2019; Established by: Biopsy-proven Detailed Pathology: Stage IV non-small cell lung cancer, adenocarcinoma. Primary Tumor location: Right middle lobe.  Brain metastasis. Surgeries: Hernia repair.  Lumbar laminectomy. Chemotherapy: Yes; Ongoing?  Monocertinib daily Immunotherapy?  Yes; Type: Keytruda; Ongoing? No Radiation therapy? Yes; Date Range: 03/2019, 01/2020, 06/2020; Target: SRS to the metastatic brain lesions EXAM: CT CHEST, ABDOMEN, AND PELVIS WITH CONTRAST TECHNIQUE: Multidetector CT imaging of the chest, abdomen and pelvis was performed following the standard protocol during bolus administration of intravenous contrast. CONTRAST:  178m OMNIPAQUE IOHEXOL 300 MG/ML  SOLN COMPARISON:  06/18/2020 PET-CT. CT chest, abdomen and pelvis 06/03/2020. FINDINGS: CT CHEST FINDINGS Cardiovascular: Normal heart size. No significant pericardial effusion/thickening. Right subclavian Port-A-Cath terminates in the middle third of the SVC. Great vessels are normal in course and caliber. No central pulmonary emboli. Mediastinum/Nodes: No discrete thyroid nodules. Unremarkable esophagus. No pathologically enlarged  axillary, mediastinal or hilar lymph nodes. Lungs/Pleura: No pneumothorax. Small dependent left pleural effusion, mildly increased. Small to moderate loculated dependent right pleural effusion with associated right pleural thickening and enhancement, stable. Irregular right perihilar 2.1 x 2.1 cm nodule (series 6/image 74), previously 2.2 x 2.1 cm, not appreciably changed. Irregular finely nodular thickening of minor fissure is mildly increased (5 mm thickness, previously 3 mm. Similar interlobular septal thickening throughout mid to lower right lung. New patchy ground-glass opacity in the basilar dependent right upper lobe and midportion of the right lower lobe. Peripheral right middle lobe 0.6 cm solid pulmonary nodule (series 6/image 83), stable. Platelike scarring versus atelectasis in the dependent basilar right lower lobe is unchanged. No new significant pulmonary nodules. Musculoskeletal: No aggressive appearing focal osseous lesions. Mild thoracic spondylosis. CT ABDOMEN PELVIS FINDINGS Hepatobiliary: Normal liver with no liver mass. Normal gallbladder with no radiopaque cholelithiasis. No biliary ductal dilatation. Pancreas: Normal, with no mass or duct dilation. Spleen: Normal size. No mass. Adrenals/Urinary Tract: Normal adrenals. Normal kidneys with no hydronephrosis and no renal mass. Chronic diffuse bladder wall thickening with small bilateral bladder diverticula, largest 1.5 cm in the lateral left bladder wall. Multiple layering tiny bladder stones on the left, largest 3 mm. Stomach/Bowel: Normal non-distended stomach. Normal caliber small bowel with no small bowel wall thickening. Normal appendix. Right upper quadrant mobile cecum. Mild diffuse colonic diverticulosis, with no large bowel wall thickening or acute pericolonic fat stranding. Vascular/Lymphatic: Normal caliber abdominal aorta. Patent portal, splenic, hepatic and renal veins. No pathologically enlarged lymph nodes in the abdomen or  pelvis. Reproductive: Vasectomy clips again  noted bilaterally in the upper scrotum. Mildly enlarged prostate. Other: No pneumoperitoneum, ascites or focal fluid collection. Musculoskeletal: Sclerotic round 1.3 cm L1 vertebral lesion (series 5/image 86), increased from 0.7 cm on 06/03/2020 CT. Sclerotic 3.5 cm medial right iliac bone lesion (series 2/image 95), increased from 3.0 cm. No additional focal osseous lesions. Moderate lumbar spondylosis. IMPRESSION: 1. Interval growth of sclerotic osseous metastases in the L1 vertebral body and medial right iliac bone. 2. Irregular right perihilar nodule is stable. Irregular finely nodular thickening of the minor fissure is mildly increased, cannot exclude increasing pleural tumor. Similar interlobular septal thickening throughout the mid to lower right lung, potentially lymphangitic tumor. New patchy ground-glass opacity in the mid to lower right lung, nonspecific, suggest attention on follow-up. 3. Small to moderate loculated dependent right pleural effusion, stable. Small dependent left pleural effusion, mildly increased. 4. Chronic diffuse bladder wall thickening with small bilateral bladder diverticula, suggesting chronic bladder outlet obstruction by the mildly enlarged prostate. Multiple layering tiny bladder stones. No hydronephrosis. 5. Mild diffuse colonic diverticulosis. Electronically Signed   By: Ilona Sorrel M.D.   On: 08/22/2020 11:42    ASSESSMENT AND PLAN: This is a very pleasant 70 years old white male recently diagnosed with a stage IV (T2b, N2, M1C) non-small cell lung cancer, adenocarcinoma with positive EGFR mutation in exon 20 (resistant mutation) diagnosed in July 2020 and presented with extensive disease involving the right upper lobe as well as the right middle and lower lobe with extensive pleural based metastasis as well as mediastinal and hilar disease with malignant pleural effusion as well as liver and brain metastasis. Unfortunately the  patient has no actionable mutations based on the molecular studies by [facility].   The patient is currently undergoing systemic chemotherapy with carboplatin for AUC of 5, Alimta 500 mg/M2 and Keytruda 200 mg IV every 3 weeks status post 21 cycles.  Starting from cycle #5 the patient is on maintenance treatment with Alimta and Keytruda. Beryle Flock was discontinued after cycle #18 secondary to recurrent immunotherapy mediated pneumonitis. Starting from cycle #19 the patient will be on treatment with single agent Alimta every 3 weeks. The patient has been tolerating the treatment well. He had repeat PET scan and MRI of the brain performed recently.  The PET scan showed evidence for disease progression with metastatic disease involving the thoracic and abdominal lymph nodes as well as the right hemithorax and bones.  He also has loculated moderate right pleural effusion and small left pleural effusion.  The PET scan also showed prostate enlargement and his PSA has been rising recently.  He was seen by urology. MRI of the brain showed development of new 5 mm right temporal lesion suspicious for metastatic disease as well as new 2 mm focus of enhancement in the left periventricular white matter also suspicious for metastatic disease.  He was seen by radiation oncology and expected to have [place] to this lesion soon. The patient is currently on treatment with Monocertinib (Axkibity) 703 mg p.o. daily.  He is tolerating his treatment fairly well except for mild skin rash. He had repeat CT scan of the chest, abdomen pelvis performed recently.  I personally and independently reviewed the scan images and discussed the results with the patient today. His scan showed no concerning findings for disease progression except for enlarging bone metastasis that likely could be from a healing process but disease progression could not be excluded.  Patient is asymptomatic from these lesions I recommended for him to continue  his  current treatment with Monocertinib (Axkibity) with the same dose. I will see him back for follow-up visit in 1 months for evaluation and repeat blood work. He requested antibody test for COVID-19 to assess his immune response to the vaccine and this was ordered today. The patient was advised to call immediately if he has any concerning symptoms in the interval. The patient voices understanding of current disease status and treatment options and is in agreement with the current care plan. All questions were answered. The patient knows to call the clinic with any problems, questions or concerns. We can certainly see the patient much sooner if necessary.  Disclaimer: This note was dictated with voice recognition software. Similar sounding words can inadvertently be transcribed and may not be corrected upon review.

## 2020-08-26 NOTE — Progress Notes (Signed)
Primary Care Provider: Laurey Morale, MD Cardiologist: No primary care provider on file. Electrophysiologist: None  Clinic Note: Chief Complaint  Patient presents with   Follow-up    Doing much better.   Edema    Likely component of lymphedema and hypoalbuminemia   Problem List Items Addressed This Visit    Borderline hypertension - Primary (Chronic)   Bilateral lower extremity edema (Chronic) - Lymphedema     HPI:    Ethan Brown is a 70 y.o. male with a PMH notable for history of TGA, HLD as well as Stage IV Lung Cancer with chronic extremity edema (thought related to venous stasis, hypoalbuminemia, and lymphedema) who presents today for 71-month follow-up.  Ethan Brown was last seen on 02/22/2020--no major cardiac complaints.  Had an echo checked in April showed no normal EF with abnormal septal wall motion due to bundle branch block with normal valves..  His edema is much better than it was every day stationary bicycle.-Admit indicating that he is not willing to let the cancer get better event.  Does note exertional dyspnea if he overdoes it.  No PND orthopnea.  Recent Hospitalizations: None  Reviewed  CV studies:    The following studies were reviewed today: (if available, images/films reviewed: From Epic Chart or Care Everywhere)  None:  Interval History:   Ethan Brown returns here today for 52-month follow-up doing pretty well.  He actually is really starting to feel more back to normal again after his cancer treatments.  He is now on oral chemotherapy. Edema doing well - better as Albumin levels improve.  Lymphedema therapy done & wears support hose.  SOB much better - is now on new PO med. Now able to walk 3/4 mile up hill & then back (about 3-0 min /day).  Energy level notably improved as of December.    CV Review of Symptoms (Summary): positive for - dyspnea on exertion, edema and Necessarily he has some dyspnea on exertion, but  significant improved.  Minimal edema.  Energy level slowly improving. negative for - chest pain, irregular heartbeat, orthopnea, palpitations, paroxysmal nocturnal dyspnea, rapid heart rate or Lightheadedness or dizziness, syncope/near syncope or TIA/amaurosis fugax, claudication.  The patient does not have symptoms conce rning for COVID-19 infection (fever, chills, cough, or new shortness of breath).   REVIEWED OF SYSTEMS   Review of Systems  Constitutional: Negative for malaise/fatigue.  HENT: Negative for congestion and nosebleeds.   Respiratory: Positive for cough and shortness of breath (upon exertion - at end of 3/4 mile up-hill wallk). Negative for sputum production and wheezing.   Cardiovascular: Positive for palpitations (knowns that he has "irregular" heart beats - but cannot feel. ) and leg swelling (stable ).  Gastrointestinal: Negative for blood in stool and melena.  Genitourinary: Negative for hematuria.  Musculoskeletal: Negative for back pain, falls and joint pain.  Neurological: Negative for dizziness, tingling, focal weakness and weakness.  Endo/Heme/Allergies: Does not bruise/bleed easily.  Psychiatric/Behavioral: Negative for depression and memory loss. The patient is not nervous/anxious and does not have insomnia (2 melatonin /night).        In good spirits happy with new medication. Eating better. Sleeping better; he bases come to grips with the fact that he will be on lifelong chemotherapy     I have reviewed and (if needed) personally updated the patient's problem list, medications, allergies, past medical and surgical history, social and family history.   PAST MEDICAL HISTORY   Past Medical  History:  Diagnosis Date   Borderline hypertension    Brain metastasis ([place]) dx'd 01/2019   Detached vitreous humor, left    Dyspnea    ED (erectile dysfunction) of organic origin    Hyperlipidemia    Hypertension    Lens subluxation, left    Lung cancer ([place])  dx'd 01/2019   Stage IV   Migraine headache    takes PRN Imitrex   Pseudophakia    TGA (transient global amnesia) 2017    PAST SURGICAL HISTORY   Past Surgical History:  Procedure Laterality Date   CAROTID DOPPLERS  09/2017   Mild non-obstructive disease   CATARACT EXTRACTION, BILATERAL     cataract, left  2018   CHEST TUBE INSERTION Right 03/29/2019   Procedure: INSERTION PLEURAL DRAINAGE CATHETER;  Surgeon: [name], MD;  Location: [facility];  Service: Thoracic;  Laterality: Right;   COLONOSCOPY  2016   clear, repeat in 10 yrs    Eye surgeries     , Lens attachment.  Vitrectomy   FEMORAL HERNIA REPAIR     HERNIA REPAIR     IR THORACENTESIS ASP PLEURAL SPACE W/IMG GUIDE  02/22/2019   IR THORACENTESIS ASP PLEURAL SPACE W/IMG GUIDE  03/13/2019   IR THORACENTESIS ASP PLEURAL SPACE W/IMG GUIDE  03/18/2020   LUMBAR LAMINECTOMY     PLEURAL BIOPSY Right 03/29/2019   Procedure: PLEURAL BIOPSY;  Surgeon: [name], MD;  Location: [facility];  Service: Thoracic;  Laterality: Right;   PORTACATH PLACEMENT N/A 03/29/2019   Procedure: INSERTION PORT-A-CATH;  Surgeon: [name], MD;  Location: [facility];  Service: Thoracic;  Laterality: N/A;   retinal attachment, right     sclearl buckle, right     TRANSTHORACIC ECHOCARDIOGRAM  12/14/2018   EF 50 EF 55% (low normal).  Abnormal septal wall motion related to BBB; indeterminate diastolic parameters.  Mild RV enlargement with mildly reduced function.  Normal valves.  Normal atrial sizes.   victrectomy,right     VIDEO ASSISTED THORACOSCOPY Right 03/29/2019   Procedure: VIDEO ASSISTED THORACOSCOPY;  Surgeon: [name], MD;  Location: [facility];  Service: Thoracic;  Laterality: Right;    Immunization History  Administered Date(s) Administered   Fluad Quad(high Dose 65+) 05/04/2019   Influenza, High Dose Seasonal PF 05/21/2018   PFIZER SARS-COV-2 Vaccination 09/29/2019, 10/24/2019     MEDICATIONS/ALLERGIES   Current Meds  Medication Sig   cyanocobalamin (,VITAMIN B-12,) 1000 MCG/ML injection INJECT 1 ML (1,000 MCG TOTAL) INTO THE MUSCLE ONCE FOR 1 DOSE.   hydrochlorothiazide (MICROZIDE) 12.5 MG capsule Take 1 capsule (12.5 mg total) by mouth 2 (two) times daily.   hydrocortisone 1 % lotion Apply 1 application topically 2 (two) times daily.   lidocaine-prilocaine (EMLA) cream Apply to the Port-A-Cath site 30-60 minutes before treatment   LORazepam (ATIVAN) 1 MG tablet Take 1 tablet (1 mg total) by mouth as needed for anxiety (30 minutes prior to radiation treatment and may repeat just prior to procedure if needed.).   magnesium oxide (MAG-OX) 400 MG tablet TAKE 1 TABLET BY MOUTH EVERY DAY   Mobocertinib Succinate (EXKIVITY) 40 MG CAPS Take 160 mg by mouth every morning.   Multiple Vitamin (MULTIVITAMIN WITH MINERALS) TABS tablet Take 1 tablet by mouth daily.   omeprazole (PRILOSEC OTC) 20 MG tablet Take 20 mg by mouth daily.   prochlorperazine (COMPAZINE) 10 MG tablet Take 1 tablet (10 mg total) by mouth every 6 (six) hours as needed for nausea or  vomiting.   sildenafil (REVATIO) 20 MG tablet TAKE AS DIRECTED   sulfamethoxazole-trimethoprim (BACTRIM DS) 800-160 MG tablet Take 1 tablet by mouth 2 (two) times daily.   tamsulosin (FLOMAX) 0.4 MG CAPS capsule Take 0.4 mg by mouth daily.   [DISCONTINUED] chlorpheniramine-HYDROcodone (TUSSIONEX) 10-8 MG/5ML SUER Take 5 mLs by mouth at bedtime as needed for cough.   [DISCONTINUED] guaiFENesin-codeine 100-10 MG/5ML syrup Take 10 mLs by mouth every 4 (four) hours as needed for cough.    No Known Allergies  SOCIAL HISTORY/FAMILY HISTORY   Reviewed in Epic:  Pertinent findings:  Social History   Tobacco Use   Smoking status: Never Smoker   Smokeless tobacco: Never Used  Vaping Use   Vaping Use: Never used  Substance Use Topics   Alcohol use: Yes    Alcohol/week: 1.0 standard drink    Types: 1  Glasses of wine per week   Drug use: No   Social History   Social History Narrative   Married father of 3, grandfather 8.  Lives with his wife, [name] they recently moved to [place] after living in [place].      He is a retired Engineer, maintenance (IT)   Was previously on an exercise regimen at [employer] 3 days a week for 90 minutes at a time --> he is now hoping to get back into that plan, but is currently now riding his bike at least 30 to 40 minutes a day for 5 days a week and then doing longer walks about 6 days a week.    OBJCTIVE -PE, EKG, labs   Wt Readings from Last 3 Encounters:  08/26/20 197 lb 12.8 oz (89.7 kg)  08/26/20 198 lb (89.8 kg)  07/29/20 194 lb 4.8 oz (88.1 kg)    Physical Exam: BP 115/64    Pulse 76    Ht 5\' 11"  (1.803 m)    Wt 198 lb (89.8 kg)    SpO2 98%    BMI 27.62 kg/m  Physical Exam Constitutional:      Appearance: Normal appearance. He is not ill-appearing (Healthy-appearing.), toxic-appearing or diaphoretic.     Comments: Well-groomed  HENT:     Head: Normocephalic and atraumatic.  Neck:     Vascular: No carotid bruit, hepatojugular reflux or JVD.  Cardiovascular:     Rate and Rhythm: Normal rate and regular rhythm. Occasional extrasystoles are present.    Chest Wall: PMI is not displaced.     Pulses: Normal pulses.     Heart sounds: S1 normal and S2 normal. Heart sounds are distant. No murmur heard. No friction rub. No gallop.   Pulmonary:     Effort: Pulmonary effort is normal. No respiratory distress.     Comments: Mildly diminished breath sounds in the right base with faint rhonchi. Chest:     Chest wall: No tenderness.  Musculoskeletal:     Cervical back: Normal range of motion and neck supple.     Right lower leg: Right lower leg edema:  trace-1+     Left lower leg: Edema (~1+) present.  Lymphadenopathy:     Cervical: No cervical adenopathy.  Neurological:     General: No focal deficit present.     Mental Status: He is alert and  oriented to person, place, and time.  Psychiatric:        Mood and Affect: Mood normal.        Behavior: Behavior normal.        Thought Content: Thought content normal.  Judgment: Judgment normal.     Adult ECG Report  Rate: 76 ;  Rhythm: normal sinus rhythm and LAFB (-72 ); RBBB-bifascicular block;   Narrative Interpretation: Stable  Recent Labs:  Should be due for lipids to be sent.  Reviewed. Lab Results  Component Value Date   CHOL 169 08/30/2018   HDL 41.40 08/30/2018   LDLCALC 104 (H) 08/30/2018   TRIG 115.0 08/30/2018   CHOLHDL 4 08/30/2018   Lab Results  Component Value Date   CREATININE 1.19 08/26/2020   BUN 25 (H) 08/26/2020   NA 139 08/26/2020   K 4.1 08/26/2020   CL 109 08/26/2020   CO2 27 08/26/2020   CBC Latest Ref Rng & Units 08/26/2020 07/29/2020 07/15/2020  WBC 4.0 - 10.5 K/uL 4.4 5.8 5.1  Hemoglobin 13.0 - 17.0 g/dL 11.2(L) 11.1(L) 9.8(L)  Hematocrit 39.0 - 52.0 % 34.2(L) 35.2(L) 30.7(L)  Platelets 150 - 400 K/uL 179 193 239    Lab Results  Component Value Date   TSH 0.786 07/29/2020    ASSESSMENT/PLAN    Problem List Items Addressed This Visit    Borderline hypertension - Primary (Chronic)    Blood pressure looks well controlled on low-dose HCTZ.      Relevant Orders   EKG 12-Lead (Completed)   Bifascicular bundle branch block (Chronic)    He has LAFB and RBBB.  I explained the conduction disorder and concerns about potential complete heart block in future. -Avoid AV nodal agents including beta-blockers, nondihydropyridine calcium channel blockers, digoxin  Monitor for bradycardia or pauses, for now no indication to consider pacemaker      Bilateral lower extremity edema (Chronic)    Pretty well controlled after starting HCTZ.  He wears support stockings, also discussed elevation of his feet.  My suspicion is that this is probably definitely less CHF related, and more related to venous stasis and lymphedema with hypoalbuminemia.   No symptoms of of PND or orthopnea.  Echo did not show any evidence of elevated pulmonary pressures.  With better nutrition, he is also reduced his hypoalbuminemia which helps.  Now he has minimal edema.      Relevant Orders   EKG 12-Lead (Completed)   Shortness of breath    Pretty stable now.  Exertional dyspnea, now more related to deconditioning and lung cancer.  No signs of heart failure.          COVID-19 Education: The signs and symptoms of COVID-19 were discussed with the patient and how to seek care for testing (follow up with PCP or arrange E-visit).   The importance of social distancing and COVID-19 vaccination was discussed today.  The patient is practicing social distancing & Masking.   I spent a total of 20minutes with the patient spent in direct patient consultation.  Additional time spent with chart review  / charting (studies, outside notes, etc): 10 min Total Time: 33 min   Current medicines are reviewed at length with the patient today.  (+/- concerns) n/a  This visit occurred during the SARS-CoV-2 public health emergency.  Safety protocols were in place, including screening questions prior to the visit, additional usage of staff PPE, and extensive cleaning of exam room while observing appropriate contact time as indicated for disinfecting solutions.  Notice: This dictation was prepared with Dragon dictation along with smaller phrase technology. Any transcriptional errors that result from this process are unintentional and may not be corrected upon review.  Patient Instructions / Medication Changes & Studies & Tests Ordered  Patient Instructions  Medication Instructions:   No changes *If you need a refill on your cardiac medications before your next appointment, please call your pharmacy*   Lab Work: Not needed .   Testing/Procedures:  Not needed  Follow-Up: At [facility], you and your health needs are our priority.  As part of our continuing  mission to provide you with exceptional heart care, we have created designated Provider Care Teams.  These Care Teams include your primary Cardiologist (physician) and Advanced Practice Providers (APPs -  Physician Assistants and Nurse Practitioners) who all work together to provide you with the care you need, when you need it.     Your next appointment:   6 month(s)  The format for your next appointment:   In Person  Provider:   Glenetta Hew, MD   Other Instructions    Studies Ordered:   Orders Placed This Encounter  Procedures   EKG 12-Lead     Glenetta Hew, M.D., M.S. Interventional Cardiologist   Pager # [phone] Phone # [phone] [address] [place], [place] [address]   Thank you for choosing Heartcare at [facility]!!

## 2020-08-26 NOTE — Patient Instructions (Signed)
Medication Instructions:   No changes *If you need a refill on your cardiac medications before your next appointment, please call your pharmacy*   Lab Work: Not needed .   Testing/Procedures:  Not needed  Follow-Up: At [facility], you and your health needs are our priority.  As part of our continuing mission to provide you with exceptional heart care, we have created designated Provider Care Teams.  These Care Teams include your primary Cardiologist (physician) and Advanced Practice Providers (APPs -  Physician Assistants and Nurse Practitioners) who all work together to provide you with the care you need, when you need it.     Your next appointment:   6 month(s)  The format for your next appointment:   In Person  Provider:   Glenetta Hew, MD   Other Instructions

## 2020-08-27 ENCOUNTER — Other Ambulatory Visit: Payer: Medicare Other

## 2020-08-27 ENCOUNTER — Ambulatory Visit: Payer: Medicare Other | Admitting: Physician Assistant

## 2020-08-27 ENCOUNTER — Ambulatory Visit: Payer: Medicare Other

## 2020-08-28 ENCOUNTER — Encounter: Payer: Self-pay | Admitting: Cardiology

## 2020-08-28 ENCOUNTER — Telehealth: Payer: Self-pay | Admitting: Internal Medicine

## 2020-08-28 DIAGNOSIS — I452 Bifascicular block: Secondary | ICD-10-CM | POA: Insufficient documentation

## 2020-08-28 NOTE — Telephone Encounter (Signed)
Scheduled per 1/3 los. Called and spoke with pt, confirmed 2/4 appts

## 2020-08-28 NOTE — Assessment & Plan Note (Addendum)
Pretty well controlled after starting HCTZ.  He wears support stockings, also discussed elevation of his feet.  My suspicion is that this is probably definitely less CHF related, and more related to venous stasis and lymphedema with hypoalbuminemia.  No symptoms of of PND or orthopnea.  Echo did not show any evidence of elevated pulmonary pressures.  With better nutrition, he is also reduced his hypoalbuminemia which helps.  Now he has minimal edema.

## 2020-08-28 NOTE — Assessment & Plan Note (Signed)
Blood pressure looks well controlled on low-dose HCTZ.

## 2020-08-28 NOTE — Assessment & Plan Note (Addendum)
He has LAFB and RBBB.  I explained the conduction disorder and concerns about potential complete heart block in future. -Avoid AV nodal agents including beta-blockers, nondihydropyridine calcium channel blockers, digoxin  Monitor for bradycardia or pauses, for now no indication to consider pacemaker

## 2020-08-28 NOTE — Assessment & Plan Note (Signed)
Pretty stable now.  Exertional dyspnea, now more related to deconditioning and lung cancer.  No signs of heart failure.

## 2020-09-04 NOTE — Progress Notes (Signed)
  Radiation Oncology         [phone]) [phone] ________________________________  Name: Ethan Brown MRN: [id number]  Date: 07/03/2020  DOB: [birth year]  End of Treatment Note  Diagnosis:   70 y.o. man with two new brain metastases from Stage IV (T2b, N2, M1c) adenocarcinoma of the right middle lobe of the lung.     Indication for treatment:  Palliation       Radiation treatment dates:   07/03/20  Site/dose/beams/energy:    Left periventricular 2 mm target was treated using 3 Dynamic Conformal Arcs to a prescription dose of 20 Gy.  ExacTrac registration was performed for each couch angle.  The 100% isodose line was prescribed with hotspot 113.5%.  6 MV X-rays were delivered in the flattening filter free beam mode. Right temporal 5 mm target was treated using 3 Dynamic Conformal Arcs to a prescription dose of 20 Gy.  ExacTrac registration was performed for each couch angle.  The 100% isodose line was prescribed with hotspot of 134.7%.  6 MV X-rays were delivered in the flattening filter free beam mode.  Narrative: The patient tolerated radiation treatment relatively well.     Plan: The patient has completed radiation treatment. The patient will return to radiation oncology clinic for routine followup in one month. I advised him to call or return sooner if he has any questions or concerns related to his recovery or treatment. ________________________________  Sheral Apley. Tammi Klippel, M.D.

## 2020-09-06 ENCOUNTER — Telehealth: Payer: Self-pay | Admitting: Radiation Therapy

## 2020-09-06 ENCOUNTER — Other Ambulatory Visit: Payer: Self-pay | Admitting: Radiation Therapy

## 2020-09-06 DIAGNOSIS — C7949 Secondary malignant neoplasm of other parts of nervous system: Secondary | ICD-10-CM

## 2020-09-06 DIAGNOSIS — C7931 Secondary malignant neoplasm of brain: Secondary | ICD-10-CM

## 2020-09-06 NOTE — Progress Notes (Signed)
Orders entered to access port the day of brain MRI @ GI.   Mont Dutton R.T.(R)(T) Radiation Special Procedures Navigator

## 2020-09-06 NOTE — Telephone Encounter (Signed)
Spoke with pt about his upcoming brain MRI and telephone visit with [facility] in February. He knows to arrive 45 min early for his port to be accessed and said that he will not need the Ativan for the MRI scan.   Mont Dutton R.T.(R)(T) Radiation Special Procedures Navigator

## 2020-09-27 ENCOUNTER — Other Ambulatory Visit: Payer: Medicare Other

## 2020-09-27 ENCOUNTER — Ambulatory Visit: Payer: Medicare Other | Admitting: Physician Assistant

## 2020-09-27 NOTE — Progress Notes (Signed)
[facility] OFFICE PROGRESS NOTE  Laurey Morale, MD [place] [address]  DIAGNOSIS: Stage IV (T2b, N2, M1c)presented with right middle lobe lung mass in addition to mediastinal lymphadenopathy as well as malignant right pleural effusion with pleural-based nodules and suspicious right hepatic lesion and the brain metastasis diagnosed in July 2020.  Molecular Biomarkers: [id number] (Exon 20 insertion) 0.2% Dacomitinib,Neratinib,Osimertinib  [id number] 0.1% None  PRIOR THERAPY: 1)SRS treatment to the metastatic disease to the brain under the care of Dr. Tammi Klippel. Completed on 04/07/2019. 2) SRS to the suspicious small brain lesion in the thalamocapsular region 01/23/2020 under the care of Dr. Tammi Klippel 3)Systemic chemotherapy with carboplatin for AUC of 5, Alimta 500 mg/M2 and Keytruda 200 mg IV every 3 weeks. First dose April 04, 2019.Status post21cycles.Starting from cycle #5 he has been onmaintenanceAlimta and Keytruda every 3 weeks. This was discontinued due to evidence for disease progression.  4) SRS to the metastatic brain lesions under the care of Dr. Tammi Klippel on 07/03/20  CURRENT THERAPY: Monocertinib (Axkibity) 329 mg p.o. daily.First dose on11/6/21. Status post 3 months of treatment.   INTERVAL HISTORY: Kyen Taite 70 y.o. male returns to the clinic today for a follow up visit. The patient is feeling fair today without any concerning complaints except alopecia and very try nasal passages secondary to his treatment. The patient is currently undergoing oral chemotherapy with monocertinib. He has been using Vaseline for the nose. He has tried multiple thing OTC and that has been the only one effective for him. He also previously had a skin rash on his face from his treatment. He uses hydrocortisone cream for his skin rash. His rash is localized to his face and he states the skin under his eyes feel rough like  sandpaper.  He has some mild diarrhea which occurs 1-2x per week. He takes imodium with good control of his symptoms. He had some lower extremity swelling likely related to lymphedema and hypoalbuminemia. He met with nutrition in the past to review strategies to increase his protein intake. He also has physical therapy with the lymphedema clinic. He has been trying to eat more meat and Omega 3. His swelling has improved greatly over the last few months.   Otherwise, he denies any fevers, chills, or night sweats. His cough issignificantly improved compared to before starting his treatment. He did catch a slight cold from his granddaughter and started getting a slight cough and sore throat.He denies chest pain or hemoptysis except for chest discomfort in the location of the tumor when he coughs.He has a prescription for Tussionex as well as guaifenesin/codeine.He had one episode of vomiting and nausea in the interval since his last appointment. He has compazine at home if needed. He is scheduled for a repeat brain MRI next week. He follows with radiation oncology for his history of brain metastases. He is here for evaluation and repeat blood work.    MEDICAL HISTORY: Past Medical History:  Diagnosis Date  . Borderline hypertension   . Brain metastasis ([place]) dx'd 01/2019  . Detached vitreous humor, left   . Dyspnea   . ED (erectile dysfunction) of organic origin   . Hyperlipidemia   . Hypertension   . Lens subluxation, left   . Lung cancer ([facility]) dx'd 01/2019   Stage IV  . Migraine headache    takes PRN Imitrex  . Pseudophakia   . TGA (transient global amnesia) 2017    ALLERGIES:  has No Known Allergies.  MEDICATIONS:  Current Outpatient Medications  Medication Sig Dispense Refill  . cyanocobalamin (,VITAMIN B-12,) 1000 MCG/ML injection INJECT 1 ML (1,000 MCG TOTAL) INTO THE MUSCLE ONCE FOR 1 DOSE. 5 mL 1  . hydrochlorothiazide (MICROZIDE) 12.5 MG capsule Take 1 capsule (12.5 mg total)  by mouth 2 (two) times daily. 180 capsule 2  . hydrocortisone 1 % lotion Apply 1 application topically 2 (two) times daily. 113 g 0  . lidocaine-prilocaine (EMLA) cream Apply to the Port-A-Cath site 30-60 minutes before treatment 30 g 0  . LORazepam (ATIVAN) 1 MG tablet Take 1 tablet (1 mg total) by mouth as needed for anxiety (30 minutes prior to radiation treatment and may repeat just prior to procedure if needed.). 10 tablet 0  . Mobocertinib Succinate (EXKIVITY) 40 MG CAPS Take 160 mg by mouth every morning. 120 capsule 3  . Multiple Vitamin (MULTIVITAMIN WITH MINERALS) TABS tablet Take 1 tablet by mouth daily.    Marland Kitchen omeprazole (PRILOSEC OTC) 20 MG tablet Take 20 mg by mouth daily.    . prochlorperazine (COMPAZINE) 10 MG tablet Take 1 tablet (10 mg total) by mouth every 6 (six) hours as needed for nausea or vomiting. 30 tablet 1  . sildenafil (REVATIO) 20 MG tablet TAKE AS DIRECTED 10 tablet 11  . tamsulosin (FLOMAX) 0.4 MG CAPS capsule Take 0.4 mg by mouth daily.    . magnesium oxide (MAG-OX) 400 MG tablet Take 1 tablet (400 mg total) by mouth daily. 30 tablet 1  . sulfamethoxazole-trimethoprim (BACTRIM DS) 800-160 MG tablet Take 1 tablet by mouth 2 (two) times daily. (Patient not taking: Reported on 09/30/2020) 14 tablet 0   No current facility-administered medications for this visit.    SURGICAL HISTORY:  Past Surgical History:  Procedure Laterality Date  . CAROTID DOPPLERS  09/2017   Mild non-obstructive disease  . CATARACT EXTRACTION, BILATERAL    . cataract, left  2018  . CHEST TUBE INSERTION Right 03/29/2019   Procedure: INSERTION PLEURAL DRAINAGE CATHETER;  Surgeon: [name], MD;  Location: [facility];  Service: Thoracic;  Laterality: Right;  . COLONOSCOPY  2016   clear, repeat in 10 yrs   . Eye surgeries     , Lens attachment.  Vitrectomy  . FEMORAL HERNIA REPAIR    . HERNIA REPAIR    . IR THORACENTESIS ASP PLEURAL SPACE W/IMG GUIDE  02/22/2019  . IR THORACENTESIS ASP  PLEURAL SPACE W/IMG GUIDE  03/13/2019  . IR THORACENTESIS ASP PLEURAL SPACE W/IMG GUIDE  03/18/2020  . LUMBAR LAMINECTOMY    . PLEURAL BIOPSY Right 03/29/2019   Procedure: PLEURAL BIOPSY;  Surgeon: [name], MD;  Location: [facility];  Service: Thoracic;  Laterality: Right;  . PORTACATH PLACEMENT N/A 03/29/2019   Procedure: INSERTION PORT-A-CATH;  Surgeon: [name], MD;  Location: [facility];  Service: Thoracic;  Laterality: N/A;  . retinal attachment, right    . sclearl buckle, right    . TRANSTHORACIC ECHOCARDIOGRAM  12/14/2018   EF 50 EF 55% (low normal).  Abnormal septal wall motion related to BBB; indeterminate diastolic parameters.  Mild RV enlargement with mildly reduced function.  Normal valves.  Normal atrial sizes.  . victrectomy,right    . VIDEO ASSISTED THORACOSCOPY Right 03/29/2019   Procedure: VIDEO ASSISTED THORACOSCOPY;  Surgeon: [name], MD;  Location: [facility] OR;  Service: Thoracic;  Laterality: Right;    REVIEW OF SYSTEMS:   Review of Systems  Constitutional: Negative for appetite change, chills, fatigue, fever and  unexpected weight change.  HENT: Positive for dry nasal passages. Negative for mouth sores, nosebleeds, sore throat and trouble swallowing.  Eyes: Negative for eye problems and icterus.  Respiratory:Positive for dyspnea on exertion (stable).Negative for hemoptysis and wheezing.  Cardiovascular: Negative for chest pain. Lower extremity swelling improved. Gastrointestinal:Positive for intermittent diarrhea. Negative for abdominal pain, constipation, and vomiting/nausea (none at this time).  Genitourinary: Negative for bladder incontinence, difficulty urinating, dysuria, frequency and hematuria.  Musculoskeletal: Negative for back pain, gait problem, neck pain and neck stiffness.  Skin:Improved erythema/flushing on cheeks. Mild rash on forehead.  Neurological: Negative for dizziness, extremity weakness, gait problem, headaches,  light-headedness and seizures.  Hematological: Negative for adenopathy. Does not bruise/bleed easily.  Psychiatric/Behavioral: Negative for confusion, depression and sleep disturbance. The patient is not nervous/anxious.    PHYSICAL EXAMINATION:  Blood pressure (!) 145/77, pulse 76, temperature 99.1 F (37.3 C), temperature source Tympanic, resp. rate 18, height '5\' 11"'  (1.803 m), weight 194 lb 3.2 oz (88.1 kg), SpO2 98 %.  ECOG PERFORMANCE STATUS: 1 - Symptomatic but completely ambulatory  Physical Exam  Constitutional: Oriented to person, place, and time and well-developed, well-nourished, and in no distress.  HENT:  Head: Normocephalic and atraumatic.  Mouth/Throat: Oropharynx is clear and moist. No oropharyngeal exudate.  Eyes: Conjunctivae are normal. Right eye exhibits no discharge. Left eye exhibits no discharge. No scleral icterus.  Neck: Normal range of motion. Neck supple.  Cardiovascular: Normal rate, regular rhythm, normal heart sounds and intact distal pulses.   Pulmonary/Chest: Effort normal. Quieter breath sounds in right lung compared to left. No respiratory distress. No wheezes. No rales.  Abdominal: Soft. Bowel sounds are normal. Exhibits no distension and no mass. There is no tenderness.  Musculoskeletal: Normal range of motion. Exhibits no edema.  Lymphadenopathy:    No cervical adenopathy.  Neurological: Alert and oriented to person, place, and time. Exhibits normal muscle tone. Gait normal. Coordination normal.  Skin: Mild skin rash on forehead. Skin is warm and dry. Not diaphoretic. No pallor.  Psychiatric: Mood, memory and judgment normal.  Vitals reviewed.  LABORATORY DATA: Lab Results  Component Value Date   WBC 4.6 09/30/2020   HGB 11.9 (L) 09/30/2020   HCT 36.7 (L) 09/30/2020   MCV 88.6 09/30/2020   PLT 150 09/30/2020      Chemistry      Component Value Date/Time   NA 138 09/30/2020 1100   K 3.8 09/30/2020 1100   CL 107 09/30/2020 1100   CO2 24  09/30/2020 1100   BUN 28 (H) 09/30/2020 1100   CREATININE 1.17 09/30/2020 1100      Component Value Date/Time   CALCIUM 8.8 (L) 09/30/2020 1100   ALKPHOS 74 09/30/2020 1100   AST 28 09/30/2020 1100   ALT 22 09/30/2020 1100   BILITOT 0.3 09/30/2020 1100       RADIOGRAPHIC STUDIES:  No results found.   ASSESSMENT/PLAN:  This isa very pleasant60 year old Caucasian male who was diagnosed with stage IV non-small cell lung cancer, adenocarcinoma with a positive EGFR resistant mutation in exon 20.He presented with extensive disease involving the right upper lobe, right middle lobe, and right lower lobe with extensive pleural-based metastases. He also has mediastinal and hilar adenopathy and a rightmalignant pleural effusion. He also has metastatic disease to the brain and a suspicious liver lesion.  He completed SRS treatment to the metastatic disease to the brain under care of Dr. Tammi Klippel.  He had a pleurex catheter placed under the care of Dr. Roxan Hockey  for his malignant pleural effusion.This was removed 07/11/2019  He then had [place] treatment to the another suspicious brain lesion on 01/23/20 under the care of Dr. Tammi Klippel.  He then underwent systsystemic chemotherapy with carboplatin AUC of 5, Alimta 500 mg/m, and Keytruda 200 mg IV every 3 weeks. He is status post20cycles. Starting from cycle #5, he had been on maintenance Alimta 500 mg/m2 and Keytruda 200 mg IV every 3 weeks.Beryle Flock was discontinued due topneumonitisstarting from cycle #17.This was discontinued after cycle #21 due to evidence for disease progression.   He is currently undergoing treatment withthe new targeted agent Monocertinib (Axkibity) that was recently approved by the FDA for patient with EGFR exon 20 mutation.He started this on 06/29/20. He has been tolerating fairly well except for skin rash, dry mouth/skin/nasal passages, and mild diarrhea. He also has some alopecia. He manages his symptoms well.     Labs were reviewed. Recommend that he ppoceed with the same treatment at the same dose.   We will see him back for a follow up visit in 4 weeks for evaluation and repeat blood work. He will continue to use hydrocortisone cream if needed for his rash.   He will continue to use Vaseline for his dry nose.   He has improved his albumin greatly which has helped his swelling. He will continue to increase his protein intake.   I have refilled his magnesium prescription today.   He will have his repeat brain MRI and follow up with radiation oncology as scheduled.   Per Dr. Julien Nordmann, we will arrange for his next restaging CT scan at his next appointment.  The patient was advised to call immediately if he has any concerning symptoms in the interval. The patient voices understanding of current disease status and treatment options and is in agreement with the current care plan. All questions were answered. The patient knows to call the clinic with any problems, questions or concerns. We can certainly see the patient much sooner if necessary     Orders Placed This Encounter  Procedures  . CBC with Differential (Cancer Center Only)    Standing Status:   Future    Standing Expiration Date:   09/30/2021  . CMP ([place] [facility] only)    Standing Status:   Future    Standing Expiration Date:   09/30/2021  . Magnesium    Standing Status:   Future    Standing Expiration Date:   09/30/2021     I spent 20-29 minutes in this encounter. Tobe Sos Mylie Mccurley, PA-C 09/30/20

## 2020-09-30 ENCOUNTER — Inpatient Hospital Stay: Payer: Medicare Other | Attending: Internal Medicine

## 2020-09-30 ENCOUNTER — Inpatient Hospital Stay (HOSPITAL_BASED_OUTPATIENT_CLINIC_OR_DEPARTMENT_OTHER): Payer: Medicare Other | Admitting: Physician Assistant

## 2020-09-30 ENCOUNTER — Other Ambulatory Visit: Payer: Self-pay

## 2020-09-30 ENCOUNTER — Inpatient Hospital Stay: Payer: Medicare Other

## 2020-09-30 VITALS — BP 145/77 | HR 76 | Temp 99.1°F | Resp 18 | Ht 71.0 in | Wt 194.2 lb

## 2020-09-30 DIAGNOSIS — C7931 Secondary malignant neoplasm of brain: Secondary | ICD-10-CM | POA: Diagnosis not present

## 2020-09-30 DIAGNOSIS — C3491 Malignant neoplasm of unspecified part of right bronchus or lung: Secondary | ICD-10-CM

## 2020-09-30 DIAGNOSIS — C342 Malignant neoplasm of middle lobe, bronchus or lung: Secondary | ICD-10-CM | POA: Diagnosis not present

## 2020-09-30 DIAGNOSIS — Z95828 Presence of other vascular implants and grafts: Secondary | ICD-10-CM

## 2020-09-30 LAB — CMP (CANCER CENTER ONLY)
ALT: 22 U/L (ref 0–44)
AST: 28 U/L (ref 15–41)
Albumin: 3.4 g/dL — ABNORMAL LOW (ref 3.5–5.0)
Alkaline Phosphatase: 74 U/L (ref 38–126)
Anion gap: 7 (ref 5–15)
BUN: 28 mg/dL — ABNORMAL HIGH (ref 8–23)
CO2: 24 mmol/L (ref 22–32)
Calcium: 8.8 mg/dL — ABNORMAL LOW (ref 8.9–10.3)
Chloride: 107 mmol/L (ref 98–111)
Creatinine: 1.17 mg/dL (ref 0.61–1.24)
GFR, Estimated: >60 mL/min (ref >60)
Glucose, Bld: 97 mg/dL (ref 70–99)
Potassium: 3.8 mmol/L (ref 3.5–5.1)
Sodium: 138 mmol/L (ref 135–145)
Total Bilirubin: 0.3 mg/dL (ref 0.3–1.2)
Total Protein: 6.3 g/dL — ABNORMAL LOW (ref 6.5–8.1)

## 2020-09-30 LAB — CBC WITH DIFFERENTIAL (CANCER CENTER ONLY)
Abs Immature Granulocytes: 0.01 10*3/uL (ref 0.00–0.07)
Basophils Absolute: 0 10*3/uL (ref 0.0–0.1)
Basophils Relative: 0 %
Eosinophils Absolute: 0.1 10*3/uL (ref 0.0–0.5)
Eosinophils Relative: 2 %
HCT: 36.7 % — ABNORMAL LOW (ref 39.0–52.0)
Hemoglobin: 11.9 g/dL — ABNORMAL LOW (ref 13.0–17.0)
Immature Granulocytes: 0 %
Lymphocytes Relative: 8 %
Lymphs Abs: 0.4 10*3/uL — ABNORMAL LOW (ref 0.7–4.0)
MCH: 28.7 pg (ref 26.0–34.0)
MCHC: 32.4 g/dL (ref 30.0–36.0)
MCV: 88.6 fL (ref 80.0–100.0)
Monocytes Absolute: 0.3 10*3/uL (ref 0.1–1.0)
Monocytes Relative: 7 %
Neutro Abs: 3.8 10*3/uL (ref 1.7–7.7)
Neutrophils Relative %: 83 %
Platelet Count: 150 10*3/uL (ref 150–400)
RBC: 4.14 MIL/uL — ABNORMAL LOW (ref 4.22–5.81)
RDW: 14.7 % (ref 11.5–15.5)
WBC Count: 4.6 10*3/uL (ref 4.0–10.5)
nRBC: 0 % (ref 0.0–0.2)

## 2020-09-30 LAB — MAGNESIUM: Magnesium: 1.8 mg/dL (ref 1.7–2.4)

## 2020-09-30 MED ORDER — MAGNESIUM OXIDE 400 MG PO TABS: 1.0000 | ORAL_TABLET | Freq: Every day | ORAL | 1 refills | Status: DC

## 2020-09-30 MED ORDER — SODIUM CHLORIDE 0.9% FLUSH
10.0000 mL | INTRAVENOUS | Status: DC | PRN
  Administered 2020-09-30: 10 mL
  Filled 2020-09-30: qty 10

## 2020-09-30 MED ORDER — HEPARIN SOD (PORK) LOCK FLUSH 100 UNIT/ML IV SOLN
500.0000 [IU] | Freq: Once | INTRAVENOUS | Status: AC | PRN
  Administered 2020-09-30: 500 [IU]
  Filled 2020-09-30: qty 5

## 2020-09-30 NOTE — Patient Instructions (Signed)

## 2020-10-01 ENCOUNTER — Telehealth: Payer: Self-pay | Admitting: Physician Assistant

## 2020-10-01 NOTE — Telephone Encounter (Signed)
Scheduled appointments per 2/7 los. Called patient, no answer. Left message with appointments date and times.

## 2020-10-02 ENCOUNTER — Telehealth: Payer: Self-pay

## 2020-10-02 NOTE — Telephone Encounter (Signed)
Left patient a voicemail message in regards to telephone visit with Freeman Caldron PA on 10/09/20 @ 10:30am. Called to review meaningful use. TM

## 2020-10-03 ENCOUNTER — Other Ambulatory Visit: Payer: Medicare Other

## 2020-10-03 DIAGNOSIS — C61 Malignant neoplasm of prostate: Secondary | ICD-10-CM | POA: Diagnosis not present

## 2020-10-04 ENCOUNTER — Ambulatory Visit
Admission: RE | Admit: 2020-10-04 | Discharge: 2020-10-04 | Disposition: A | Discharge status: Home / Self Care | Payer: Medicare Other | Source: Ambulatory Visit | Attending: Radiation Oncology | Admitting: Radiation Oncology

## 2020-10-04 DIAGNOSIS — H748X3 Other specified disorders of middle ear and mastoid, bilateral: Secondary | ICD-10-CM | POA: Diagnosis not present

## 2020-10-04 DIAGNOSIS — I616 Nontraumatic intracerebral hemorrhage, multiple localized: Secondary | ICD-10-CM | POA: Diagnosis not present

## 2020-10-04 DIAGNOSIS — Z85118 Personal history of other malignant neoplasm of bronchus and lung: Secondary | ICD-10-CM | POA: Diagnosis not present

## 2020-10-04 DIAGNOSIS — C7931 Secondary malignant neoplasm of brain: Secondary | ICD-10-CM

## 2020-10-04 MED ORDER — GADOBENATE DIMEGLUMINE 529 MG/ML IV SOLN
18.0000 mL | Freq: Once | INTRAVENOUS | Status: AC | PRN
  Administered 2020-10-04: 18 mL via INTRAVENOUS

## 2020-10-07 ENCOUNTER — Inpatient Hospital Stay: Payer: Medicare Other

## 2020-10-07 ENCOUNTER — Other Ambulatory Visit: Payer: Self-pay

## 2020-10-07 ENCOUNTER — Telehealth: Payer: Self-pay

## 2020-10-07 ENCOUNTER — Encounter: Payer: Self-pay | Admitting: Urology

## 2020-10-07 NOTE — Telephone Encounter (Signed)
Spoke with patient is regards to telephone visit with Ethan Caldron PA 10/09/20 @ 10:30am. Patient verbalized understanding appointment date and time. Medications reviewed. TM

## 2020-10-07 NOTE — Telephone Encounter (Signed)
Spoke with patient in regards to telephone visit on 10/09/20. TM

## 2020-10-08 ENCOUNTER — Other Ambulatory Visit: Payer: Self-pay | Admitting: Radiation Therapy

## 2020-10-08 DIAGNOSIS — C7931 Secondary malignant neoplasm of brain: Secondary | ICD-10-CM

## 2020-10-09 ENCOUNTER — Ambulatory Visit
Admission: RE | Admit: 2020-10-09 | Discharge: 2020-10-09 | Disposition: A | Discharge status: Home / Self Care | Payer: Medicare Other | Source: Ambulatory Visit | Attending: Urology | Admitting: Urology

## 2020-10-09 DIAGNOSIS — C7931 Secondary malignant neoplasm of brain: Secondary | ICD-10-CM | POA: Diagnosis not present

## 2020-10-09 DIAGNOSIS — Z08 Encounter for follow-up examination after completed treatment for malignant neoplasm: Secondary | ICD-10-CM | POA: Diagnosis not present

## 2020-10-09 DIAGNOSIS — C7949 Secondary malignant neoplasm of other parts of nervous system: Secondary | ICD-10-CM

## 2020-10-09 DIAGNOSIS — C342 Malignant neoplasm of middle lobe, bronchus or lung: Secondary | ICD-10-CM | POA: Diagnosis not present

## 2020-10-09 NOTE — Progress Notes (Signed)
.  Radiation Oncology         [phone]) [phone] ________________________________  Name: Ethan Brown MRN: [id number]  Date: 10/09/2020  DOB: Nov 09, 1950  Post Treatment Note  CC: Laurey Morale, MD  Curt Bears, MD  Diagnosis:   70 yo male with brain metastases from Stage IV (T2b, N2, M1c) adenocarcinoma of the right middle lobe of the lung.    Interval Since Last Radiation:  3 months 07/03/20: SRS//PTV3: 66mm right temporal gyrus target was treated to a prescription dose of 20 Gy in a single fraction.   SRS//PTV4: 41mm left periventricular target was treated to a prescription dose of 20 Gy in a single fraction.     01/23/2020: SRS//PTV2: 36mm right thalamocapsular target was treated to a prescription dose of 20 Gy in a single fraction.    04/07/19: SRS// PTV1:  6 mm  right parietal target was treated to a prescription dose of 20 Gy in a single fraction.    Narrative:  I spoke with the patient to conduct his routine scheduled 3 month follow up visit via telephone to spare the patient unnecessary potential exposure in the healthcare setting during the current COVID-19 pandemic.  The patient was notified in advance and gave permission to proceed with this visit format.   He has recovered well from the effects of his recent radiotherapy and is without complaints.     He was initially on systemic chemotherapy with carboplatin, Alimta and Keytruda every 3 weeks under the care and direction of Dr. Earlie Server and completed 4 cycles.  His restaging imaging showed a good response to treatment with a decrease in the right pleural effusion, decreased mediastinal and right hilar lymphadenopathy, and improvement in the liver lesion and right middle lobe pulmonary lesion so he was transitioned to maintenance therapy with Alimta and Keytruda, beginning with cycle 5 of his treatment on 06/27/2019. The Beryle Flock was discontinued after cycle 18 due to recurrent immunotherapy mediated pneumonitis.  Disease  restaging imaging with CT C/A/P from 02/09/20 showed continued disease stability so he proceeded with cycle #16 of maintenance treatment on 02/13/20.  He was on single agent Alimta beginning with cycle 19 of maintenance therapy but unfortunately, restaging scans in 05/2020 showed disease progression in the chest, abdomen and bones, confirmed on PET scan 06/18/20 as well as 2 new lesions in the brain, noted on follow up MRI brain scan from 06/20/20. The Alimta maintenance therapy was discontinued in 06/2020 and he was changed to Monocertinib (Axkibity) 371 mg p.o. daily which he is tolerating well.  He has also recently completed SRS to the 2 new brain lesions, performed on 07/03/20 and tolerated very well.  His visit today is to conduct his 3 month follow up post-treatment and review his post-treatment MRI brain scan from 10/04/20.            On review of systems, the patient states that he is doing very well overall.  He is currently without complaints and specifically denies headaches, changes in auditory or visual acuity, nausea, vomiting, dizziness, imbalance, tremors or seizure activity.  He has not had recent fevers, chills or night sweats.  He is tolerating his new systemic therapy quite well.  Overall, he is very pleased with his progress to date.  His most recent MRI brain scan from 10/04/20 shows stable-improved disease in the previously treated brain lesions. There is a new 2 mm focus of enhancement in the medial left frontal lobe, indeterminate for a metastasis versus vascular enhancement.  This was reviewed in our recent multidisciplinary brain clinic and short-term follow-up is recommended.  ALLERGIES:  has No Known Allergies.  Meds: Current Outpatient Medications  Medication Sig Dispense Refill  . cyanocobalamin (,VITAMIN B-12,) 1000 MCG/ML injection INJECT 1 ML (1,000 MCG TOTAL) INTO THE MUSCLE ONCE FOR 1 DOSE. 5 mL 1  . hydrochlorothiazide (MICROZIDE) 12.5 MG capsule Take 1 capsule (12.5 mg  total) by mouth 2 (two) times daily. 180 capsule 2  . hydrocortisone 1 % lotion Apply 1 application topically 2 (two) times daily. 113 g 0  . lidocaine-prilocaine (EMLA) cream Apply to the Port-A-Cath site 30-60 minutes before treatment 30 g 0  . LORazepam (ATIVAN) 1 MG tablet Take 1 tablet (1 mg total) by mouth as needed for anxiety (30 minutes prior to radiation treatment and may repeat just prior to procedure if needed.). 10 tablet 0  . magnesium oxide (MAG-OX) 400 MG tablet Take 1 tablet (400 mg total) by mouth daily. 30 tablet 1  . Mobocertinib Succinate (EXKIVITY) 40 MG CAPS Take 160 mg by mouth every morning. 120 capsule 3  . Multiple Vitamin (MULTIVITAMIN WITH MINERALS) TABS tablet Take 1 tablet by mouth daily.    Marland Kitchen omeprazole (PRILOSEC OTC) 20 MG tablet Take 20 mg by mouth daily.    . prochlorperazine (COMPAZINE) 10 MG tablet Take 1 tablet (10 mg total) by mouth every 6 (six) hours as needed for nausea or vomiting. 30 tablet 1  . sildenafil (REVATIO) 20 MG tablet TAKE AS DIRECTED 10 tablet 11  . tamsulosin (FLOMAX) 0.4 MG CAPS capsule Take 0.4 mg by mouth daily.    Marland Kitchen sulfamethoxazole-trimethoprim (BACTRIM DS) 800-160 MG tablet Take 1 tablet by mouth 2 (two) times daily. (Patient not taking: No sig reported) 14 tablet 0   No current facility-administered medications for this encounter.    Physical Findings: Unable to assess due to telephone follow-up visit format.  Lab Findings: Lab Results  Component Value Date   WBC 4.6 09/30/2020   HGB 11.9 (L) 09/30/2020   HCT 36.7 (L) 09/30/2020   MCV 88.6 09/30/2020   PLT 150 09/30/2020     Radiographic Findings: MR Brain W Wo Contrast  Result Date: 10/04/2020 CLINICAL DATA:  Metastatic lung cancer. Multiple brain metastases treated with SRS, most recently right temporal and left periventricular lesions treated on 07/03/2020. EXAM: MRI HEAD WITHOUT AND WITH CONTRAST TECHNIQUE: Multiplanar, multiecho pulse sequences of the brain and  surrounding structures were obtained without and with intravenous contrast. CONTRAST:  60mL MULTIHANCE GADOBENATE DIMEGLUMINE 529 MG/ML IV SOLN COMPARISON:  06/20/2020 FINDINGS: Pre- and postcontrast axial T1 SPACE sequences are mildly motion degraded. BRAIN New Lesions: 1. 2 mm focus of enhancement in the medial left frontal lobe which appears more rounded and discrete on coronal and sagittal images (series 15, image 32 and series 16, image 22) but less well-defined and possibly vascular on axial images (series 13, image 133 and series 14, image 135). Larger lesions: None. Stable or Smaller lesions: 1. 2 mm enhancing right temporal lesion, decreased in size (series 13, image 80 and series 14, image 84). Associated new hemosiderin deposition. 2. Resolution of the enhancing periventricular lesion adjacent to the body of the left lateral ventricle. 3. 2 mm enhancing lesion in the right postcentral gyrus with associated hemosiderin, unchanged (series 13, image 116 and series 14, image 118). 4. 1 mm focus of subtle enhancement at the site of the treated right thalamocapsular lesion (series 14, image 91). Other Brain findings: No acute infarct, midline  shift, or extra-axial fluid collection is identified. A chronic microhemorrhage in the left parietal lobe is unchanged. Scattered small foci of T2 hyperintensity in the cerebral white matter bilaterally are unchanged and nonspecific but compatible with mild chronic small vessel ischemic disease. The ventricles and sulci are within normal limits for age. Vascular: Major intracranial vascular flow voids are preserved. Skull and upper cervical spine: Unremarkable bone marrow signal. Sinuses/Orbits: Bilateral cataract extraction. Scattered mucosal thickening in the paranasal sinuses, moderate in the ethmoid air cells bilaterally. Small bilateral mastoid effusions. Other: None. IMPRESSION: 1. New 2 mm focus of enhancement in the medial left frontal lobe, indeterminate for a  metastasis versus vascular enhancement. Short-term follow-up is recommended. 2. Decreased size of the intervally treated right temporal and left periventricular lesions. 3. Unchanged other treated lesions. Electronically Signed   By: Logan Bores M.D.   On: 10/04/2020 11:45    Impression/Plan: 35. 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    He appears to have recovered well from the effects of his recent [facility] treatment and is currently without complaints.  We discussed the plan for a repeat MRI brain scan in approximately 2 months to closely monitor the new 2 mm focus of enhancement in the medial left frontal lobe and I will plan to call him with those results and recommendations from brain tumor board.  Pending this scan is stable, we will plan to resume serial brain MRI scans every 3 months to monitor for any disease progression or recurrence with a telephone follow-up after each scan.  He is comfortable and in agreement with the stated plan.  He knows to call at anytime in the interim with any questions or concerns.    Nicholos Johns, PA-C

## 2020-10-15 DIAGNOSIS — C61 Malignant neoplasm of prostate: Secondary | ICD-10-CM | POA: Diagnosis not present

## 2020-10-28 ENCOUNTER — Inpatient Hospital Stay: Payer: Medicare Other

## 2020-10-28 ENCOUNTER — Inpatient Hospital Stay: Payer: Medicare Other | Attending: Internal Medicine

## 2020-10-28 ENCOUNTER — Other Ambulatory Visit: Payer: Self-pay

## 2020-10-28 ENCOUNTER — Inpatient Hospital Stay (HOSPITAL_BASED_OUTPATIENT_CLINIC_OR_DEPARTMENT_OTHER): Payer: Medicare Other | Admitting: Internal Medicine

## 2020-10-28 ENCOUNTER — Encounter: Payer: Self-pay | Admitting: Internal Medicine

## 2020-10-28 VITALS — BP 142/80 | HR 72 | Temp 97.6°F | Resp 15 | Ht 71.0 in | Wt 197.4 lb

## 2020-10-28 DIAGNOSIS — C787 Secondary malignant neoplasm of liver and intrahepatic bile duct: Secondary | ICD-10-CM | POA: Insufficient documentation

## 2020-10-28 DIAGNOSIS — C342 Malignant neoplasm of middle lobe, bronchus or lung: Secondary | ICD-10-CM | POA: Insufficient documentation

## 2020-10-28 DIAGNOSIS — Z95828 Presence of other vascular implants and grafts: Secondary | ICD-10-CM

## 2020-10-28 DIAGNOSIS — I10 Essential (primary) hypertension: Secondary | ICD-10-CM | POA: Diagnosis not present

## 2020-10-28 DIAGNOSIS — Z79899 Other long term (current) drug therapy: Secondary | ICD-10-CM | POA: Diagnosis not present

## 2020-10-28 DIAGNOSIS — Z5111 Encounter for antineoplastic chemotherapy: Secondary | ICD-10-CM

## 2020-10-28 DIAGNOSIS — C3491 Malignant neoplasm of unspecified part of right bronchus or lung: Secondary | ICD-10-CM | POA: Diagnosis not present

## 2020-10-28 DIAGNOSIS — C3490 Malignant neoplasm of unspecified part of unspecified bronchus or lung: Secondary | ICD-10-CM | POA: Diagnosis not present

## 2020-10-28 DIAGNOSIS — N400 Enlarged prostate without lower urinary tract symptoms: Secondary | ICD-10-CM | POA: Insufficient documentation

## 2020-10-28 DIAGNOSIS — C7989 Secondary malignant neoplasm of other specified sites: Secondary | ICD-10-CM | POA: Diagnosis not present

## 2020-10-28 DIAGNOSIS — C7931 Secondary malignant neoplasm of brain: Secondary | ICD-10-CM

## 2020-10-28 DIAGNOSIS — Z452 Encounter for adjustment and management of vascular access device: Secondary | ICD-10-CM | POA: Diagnosis not present

## 2020-10-28 DIAGNOSIS — C7951 Secondary malignant neoplasm of bone: Secondary | ICD-10-CM | POA: Insufficient documentation

## 2020-10-28 LAB — MAGNESIUM: Magnesium: 1.8 mg/dL (ref 1.7–2.4)

## 2020-10-28 LAB — CBC WITH DIFFERENTIAL (CANCER CENTER ONLY)
Abs Immature Granulocytes: 0.01 10*3/uL (ref 0.00–0.07)
Basophils Absolute: 0 10*3/uL (ref 0.0–0.1)
Basophils Relative: 1 %
Eosinophils Absolute: 0.1 10*3/uL (ref 0.0–0.5)
Eosinophils Relative: 3 %
HCT: 36.8 % — ABNORMAL LOW (ref 39.0–52.0)
Hemoglobin: 11.9 g/dL — ABNORMAL LOW (ref 13.0–17.0)
Immature Granulocytes: 0 %
Lymphocytes Relative: 22 %
Lymphs Abs: 0.9 10*3/uL (ref 0.7–4.0)
MCH: 28.7 pg (ref 26.0–34.0)
MCHC: 32.3 g/dL (ref 30.0–36.0)
MCV: 88.7 fL (ref 80.0–100.0)
Monocytes Absolute: 0.3 10*3/uL (ref 0.1–1.0)
Monocytes Relative: 7 %
Neutro Abs: 2.9 10*3/uL (ref 1.7–7.7)
Neutrophils Relative %: 67 %
Platelet Count: 160 10*3/uL (ref 150–400)
RBC: 4.15 MIL/uL — ABNORMAL LOW (ref 4.22–5.81)
RDW: 15.9 % — ABNORMAL HIGH (ref 11.5–15.5)
WBC Count: 4.3 10*3/uL (ref 4.0–10.5)
nRBC: 0 /100 WBC

## 2020-10-28 LAB — CMP (CANCER CENTER ONLY)
ALT: 25 U/L (ref 0–44)
AST: 25 U/L (ref 15–41)
Albumin: 3.3 g/dL — ABNORMAL LOW (ref 3.5–5.0)
Alkaline Phosphatase: 73 U/L (ref 38–126)
Anion gap: 6 (ref 5–15)
BUN: 31 mg/dL — ABNORMAL HIGH (ref 8–23)
CO2: 25 mmol/L (ref 22–32)
Calcium: 8.7 mg/dL — ABNORMAL LOW (ref 8.9–10.3)
Chloride: 108 mmol/L (ref 98–111)
Creatinine: 1.27 mg/dL — ABNORMAL HIGH (ref 0.61–1.24)
GFR, Estimated: >60 mL/min (ref >60)
Glucose, Bld: 90 mg/dL (ref 70–99)
Potassium: 3.8 mmol/L (ref 3.5–5.1)
Sodium: 139 mmol/L (ref 135–145)
Total Bilirubin: 0.3 mg/dL (ref 0.3–1.2)
Total Protein: 6.3 g/dL — ABNORMAL LOW (ref 6.5–8.1)

## 2020-10-28 MED ORDER — HEPARIN SOD (PORK) LOCK FLUSH 100 UNIT/ML IV SOLN
500.0000 [IU] | Freq: Once | INTRAVENOUS | Status: AC | PRN
  Administered 2020-10-28: 500 [IU]
  Filled 2020-10-28: qty 5

## 2020-10-28 MED ORDER — SODIUM CHLORIDE 0.9% FLUSH
10.0000 mL | INTRAVENOUS | Status: DC | PRN
  Administered 2020-10-28: 10 mL
  Filled 2020-10-28: qty 10

## 2020-10-28 NOTE — Progress Notes (Signed)
[place] [phone]   [phone]  OFFICE PROGRESS NOTE  Laurey Morale, MD [place] [address]  DIAGNOSIS: Stage IV (T2b, N2, M1c) presented with right middle lobe lung mass in addition to mediastinal lymphadenopathy as well as malignant right pleural effusion with pleural-based nodules and suspicious right hepatic lesion and the brain metastasis diagnosed in July 2020.  Molecular Biomarkers: [id number] (Exon 20 insertion) 0.2% Dacomitinib,Neratinib,Osimertinib  [id number] 0.1% None   PRIOR THERAPY: Systemic chemotherapy with carboplatin for AUC of 5, Alimta 500 mg/M2 and Keytruda 200 mg IV every 3 weeks.  First dose April 04, 2019.  Status post 21 cycles  Starting from cycle #5 the patient is on maintenance treatment with Alimta and Keytruda every 3 weeks.  Starting from cycle #19 he will be on single agent Alimta.  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  CURRENT THERAPY: Monocertinib (Axkibity) 320 mg p.o. daily.  First dose was June 29, 2020.  Status post 4 months of treatment.   INTERVAL HISTORY: Ethan Brown 70 y.o. male returns to the clinic today for follow-up visit.  The patient is feeling fine today with no concerning complaints except for dryness of his nose gets worse during the nighttime.  He has hard time keeping it moist even with Vaseline as well as nasal drops.  He denied having any current chest pain, shortness of breath, cough or hemoptysis.  He has no nausea, vomiting but has occasional diarrhea with no constipation or abdominal pain.  He denied having any fever or chills.  He continues to tolerate his treatment with Monocertinib (Axkibity) fairly well.  The patient is here today for evaluation and repeat blood work.  MEDICAL HISTORY: Past Medical History:  Diagnosis Date  . Borderline hypertension   . Brain metastasis ([place]) dx'd 01/2019   . Detached vitreous humor, left   . Dyspnea   . ED (erectile dysfunction) of organic origin   . Hyperlipidemia   . Hypertension   . Lens subluxation, left   . Lung cancer ([place]) dx'd 01/2019   Stage IV  . Migraine headache    takes PRN Imitrex  . Pseudophakia   . TGA (transient global amnesia) 2017    ALLERGIES:  has No Known Allergies.  MEDICATIONS:  Current Outpatient Medications  Medication Sig Dispense Refill  . cyanocobalamin (,VITAMIN B-12,) 1000 MCG/ML injection INJECT 1 ML (1,000 MCG TOTAL) INTO THE MUSCLE ONCE FOR 1 DOSE. 5 mL 1  . hydrochlorothiazide (MICROZIDE) 12.5 MG capsule Take 1 capsule (12.5 mg total) by mouth 2 (two) times daily. 180 capsule 2  . hydrocortisone 1 % lotion Apply 1 application topically 2 (two) times daily. 113 g 0  . lidocaine-prilocaine (EMLA) cream Apply to the Port-A-Cath site 30-60 minutes before treatment 30 g 0  . LORazepam (ATIVAN) 1 MG tablet Take 1 tablet (1 mg total) by mouth as needed for anxiety (30 minutes prior to radiation treatment and may repeat just prior to procedure if needed.). 10 tablet 0  . magnesium oxide (MAG-OX) 400 MG tablet Take 1 tablet (400 mg total) by mouth daily. 30 tablet 1  . Mobocertinib Succinate (EXKIVITY) 40 MG CAPS Take 160 mg by mouth every morning. 120 capsule 3  . Multiple Vitamin (MULTIVITAMIN WITH MINERALS) TABS tablet Take 1 tablet by mouth daily.    Marland Kitchen omeprazole (PRILOSEC OTC) 20 MG tablet Take 20 mg by mouth daily.    . prochlorperazine (COMPAZINE)  10 MG tablet Take 1 tablet (10 mg total) by mouth every 6 (six) hours as needed for nausea or vomiting. 30 tablet 1  . sildenafil (REVATIO) 20 MG tablet TAKE AS DIRECTED 10 tablet 11  . sulfamethoxazole-trimethoprim (BACTRIM DS) 800-160 MG tablet Take 1 tablet by mouth 2 (two) times daily. (Patient not taking: No sig reported) 14 tablet 0  . tamsulosin (FLOMAX) 0.4 MG CAPS capsule Take 0.4 mg by mouth daily.     No current facility-administered medications  for this visit.    SURGICAL HISTORY:  Past Surgical History:  Procedure Laterality Date  . CAROTID DOPPLERS  09/2017   Mild non-obstructive disease  . CATARACT EXTRACTION, BILATERAL    . cataract, left  2018  . CHEST TUBE INSERTION Right 03/29/2019   Procedure: INSERTION PLEURAL DRAINAGE CATHETER;  Surgeon: [name], MD;  Location: [facility];  Service: Thoracic;  Laterality: Right;  . COLONOSCOPY  2016   clear, repeat in 10 yrs   . Eye surgeries     , Lens attachment.  Vitrectomy  . FEMORAL HERNIA REPAIR    . HERNIA REPAIR    . IR THORACENTESIS ASP PLEURAL SPACE W/IMG GUIDE  02/22/2019  . IR THORACENTESIS ASP PLEURAL SPACE W/IMG GUIDE  03/13/2019  . IR THORACENTESIS ASP PLEURAL SPACE W/IMG GUIDE  03/18/2020  . LUMBAR LAMINECTOMY    . PLEURAL BIOPSY Right 03/29/2019   Procedure: PLEURAL BIOPSY;  Surgeon: [name], MD;  Location: [facility];  Service: Thoracic;  Laterality: Right;  . PORTACATH PLACEMENT N/A 03/29/2019   Procedure: INSERTION PORT-A-CATH;  Surgeon: [name], MD;  Location: [facility];  Service: Thoracic;  Laterality: N/A;  . retinal attachment, right    . sclearl buckle, right    . TRANSTHORACIC ECHOCARDIOGRAM  12/14/2018   EF 50 EF 55% (low normal).  Abnormal septal wall motion related to BBB; indeterminate diastolic parameters.  Mild RV enlargement with mildly reduced function.  Normal valves.  Normal atrial sizes.  . victrectomy,right    . VIDEO ASSISTED THORACOSCOPY Right 03/29/2019   Procedure: VIDEO ASSISTED THORACOSCOPY;  Surgeon: [name], MD;  Location: [facility];  Service: Thoracic;  Laterality: Right;    REVIEW OF SYSTEMS:  A comprehensive review of systems was negative except for: Ears, nose, mouth, throat, and face: positive for Nasal dryness Gastrointestinal: positive for diarrhea   PHYSICAL EXAMINATION: General appearance: alert, cooperative and no distress Head: Normocephalic, without obvious abnormality, atraumatic Neck: no  adenopathy, no JVD, supple, symmetrical, trachea midline and thyroid not enlarged, symmetric, no tenderness/mass/nodules Lymph nodes: Cervical, supraclavicular, and axillary nodes normal. Resp: clear to auscultation bilaterally Back: symmetric, no curvature. ROM normal. No CVA tenderness. Cardio: regular rate and rhythm, S1, S2 normal, no murmur, click, rub or gallop GI: soft, non-tender; bowel sounds normal; no masses,  no organomegaly Extremities: extremities normal, atraumatic, no cyanosis or edema  ECOG PERFORMANCE STATUS: 1 - Symptomatic but completely ambulatory  Blood pressure (!) 142/80, pulse 72, temperature 97.6 F (36.4 C), temperature source Tympanic, resp. rate 15, height '5\' 11"'  (1.803 m), weight 197 lb 6.4 oz (89.5 kg), SpO2 100 %.  LABORATORY DATA: Lab Results  Component Value Date   WBC 4.6 09/30/2020   HGB 11.9 (L) 09/30/2020   HCT 36.7 (L) 09/30/2020   MCV 88.6 09/30/2020   PLT 150 09/30/2020      Chemistry      Component Value Date/Time   NA 138 09/30/2020 1100   K 3.8 09/30/2020 1100  CL 107 09/30/2020 1100   CO2 24 09/30/2020 1100   BUN 28 (H) 09/30/2020 1100   CREATININE 1.17 09/30/2020 1100      Component Value Date/Time   CALCIUM 8.8 (L) 09/30/2020 1100   ALKPHOS 74 09/30/2020 1100   AST 28 09/30/2020 1100   ALT 22 09/30/2020 1100   BILITOT 0.3 09/30/2020 1100       RADIOGRAPHIC STUDIES: MR Brain W Wo Contrast  Result Date: 10/04/2020 CLINICAL DATA:  Metastatic lung cancer. Multiple brain metastases treated with SRS, most recently right temporal and left periventricular lesions treated on 07/03/2020. EXAM: MRI HEAD WITHOUT AND WITH CONTRAST TECHNIQUE: Multiplanar, multiecho pulse sequences of the brain and surrounding structures were obtained without and with intravenous contrast. CONTRAST:  34m MULTIHANCE GADOBENATE DIMEGLUMINE 529 MG/ML IV SOLN COMPARISON:  06/20/2020 FINDINGS: Pre- and postcontrast axial T1 SPACE sequences are mildly motion  degraded. BRAIN New Lesions: 1. 2 mm focus of enhancement in the medial left frontal lobe which appears more rounded and discrete on coronal and sagittal images (series 15, image 32 and series 16, image 22) but less well-defined and possibly vascular on axial images (series 13, image 133 and series 14, image 135). Larger lesions: None. Stable or Smaller lesions: 1. 2 mm enhancing right temporal lesion, decreased in size (series 13, image 80 and series 14, image 84). Associated new hemosiderin deposition. 2. Resolution of the enhancing periventricular lesion adjacent to the body of the left lateral ventricle. 3. 2 mm enhancing lesion in the right postcentral gyrus with associated hemosiderin, unchanged (series 13, image 116 and series 14, image 118). 4. 1 mm focus of subtle enhancement at the site of the treated right thalamocapsular lesion (series 14, image 91). Other Brain findings: No acute infarct, midline shift, or extra-axial fluid collection is identified. A chronic microhemorrhage in the left parietal lobe is unchanged. Scattered small foci of T2 hyperintensity in the cerebral white matter bilaterally are unchanged and nonspecific but compatible with mild chronic small vessel ischemic disease. The ventricles and sulci are within normal limits for age. Vascular: Major intracranial vascular flow voids are preserved. Skull and upper cervical spine: Unremarkable bone marrow signal. Sinuses/Orbits: Bilateral cataract extraction. Scattered mucosal thickening in the paranasal sinuses, moderate in the ethmoid air cells bilaterally. Small bilateral mastoid effusions. Other: None. IMPRESSION: 1. New 2 mm focus of enhancement in the medial left frontal lobe, indeterminate for a metastasis versus vascular enhancement. Short-term follow-up is recommended. 2. Decreased size of the intervally treated right temporal and left periventricular lesions. 3. Unchanged other treated lesions. Electronically Signed   By: ALogan Bores M.D.   On: 10/04/2020 11:45    ASSESSMENT AND PLAN: This is a very pleasant 70years old white male recently diagnosed with a stage IV (T2b, N2, M1C) non-small cell lung cancer, adenocarcinoma with positive EGFR mutation in exon 20 (resistant mutation) diagnosed in July 2020 and presented with extensive disease involving the right upper lobe as well as the right middle and lower lobe with extensive pleural based metastasis as well as mediastinal and hilar disease with malignant pleural effusion as well as liver and brain metastasis. Unfortunately the patient has no actionable mutations based on the molecular studies by [facility] [employer].   The patient is currently undergoing systemic chemotherapy with carboplatin for AUC of 5, Alimta 500 mg/M2 and Keytruda 200 mg IV every 3 weeks status post 21 cycles.  Starting from cycle #5 the patient is on maintenance treatment with Alimta and Keytruda. KBeryle Flockwas  discontinued after cycle #18 secondary to recurrent immunotherapy mediated pneumonitis. Starting from cycle #19 the patient will be on treatment with single agent Alimta every 3 weeks. The patient has been tolerating the treatment well. He had repeat PET scan and MRI of the brain performed recently.  The PET scan showed evidence for disease progression with metastatic disease involving the thoracic and abdominal lymph nodes as well as the right hemithorax and bones.  He also has loculated moderate right pleural effusion and small left pleural effusion.  The PET scan also showed prostate enlargement and his PSA has been rising recently.  He was seen by urology. MRI of the brain showed development of new 5 mm right temporal lesion suspicious for metastatic disease as well as new 2 mm focus of enhancement in the left periventricular white matter also suspicious for metastatic disease.  He was seen by radiation oncology and expected to have Wilmer to this lesion soon. The patient is currently on treatment with  Monocertinib (Axkibity) 486 mg p.o. daily.  Status post 4 months of treatment.  He has been tolerating the treatment well with no concerning adverse effects except for the dryness of his nose that is not responding to the over-the-counter medication.  He also has infrequent episodes of diarrhea. I recommended for him to continue his current treatment with Monocertinib (Axkibity) with the same dose. I will see him back for follow-up visit in 1 months for evaluation with repeat CT scan of the chest, abdomen pelvis for restaging of his disease. For the nasal dryness and congestion, I will refer the patient to ENT for evaluation and any recommendation regarding his condition. The patient was advised to call immediately if he has any other concerning symptoms in the interval. The patient voices understanding of current disease status and treatment options and is in agreement with the current care plan. All questions were answered. The patient knows to call the clinic with any problems, questions or concerns. We can certainly see the patient much sooner if necessary.  Disclaimer: This note was dictated with voice recognition software. Similar sounding words can inadvertently be transcribed and may not be corrected upon review.

## 2020-10-29 ENCOUNTER — Telehealth: Payer: Self-pay | Admitting: Internal Medicine

## 2020-10-29 NOTE — Telephone Encounter (Signed)
Scheduled per los. Called and left msg. Mailed printout  °

## 2020-11-08 ENCOUNTER — Other Ambulatory Visit: Payer: Self-pay | Admitting: Internal Medicine

## 2020-11-08 MED ORDER — EXKIVITY 40 MG PO CAPS: 160.0000 mg | ORAL_CAPSULE | Freq: Every morning | ORAL | 3 refills | Status: DC

## 2020-11-13 ENCOUNTER — Encounter: Payer: Self-pay | Admitting: Internal Medicine

## 2020-11-14 ENCOUNTER — Ambulatory Visit (INDEPENDENT_AMBULATORY_CARE_PROVIDER_SITE_OTHER): Payer: Medicare Other | Admitting: Otolaryngology

## 2020-11-15 DIAGNOSIS — H43393 Other vitreous opacities, bilateral: Secondary | ICD-10-CM | POA: Diagnosis not present

## 2020-11-15 DIAGNOSIS — H04123 Dry eye syndrome of bilateral lacrimal glands: Secondary | ICD-10-CM | POA: Diagnosis not present

## 2020-11-15 DIAGNOSIS — H16223 Keratoconjunctivitis sicca, not specified as Sjogren's, bilateral: Secondary | ICD-10-CM | POA: Diagnosis not present

## 2020-11-15 DIAGNOSIS — H21231 Degeneration of iris (pigmentary), right eye: Secondary | ICD-10-CM | POA: Diagnosis not present

## 2020-11-21 ENCOUNTER — Encounter (HOSPITAL_COMMUNITY): Payer: Self-pay

## 2020-11-21 ENCOUNTER — Other Ambulatory Visit: Payer: Self-pay

## 2020-11-21 ENCOUNTER — Ambulatory Visit (HOSPITAL_COMMUNITY)
Admission: RE | Admit: 2020-11-21 | Discharge: 2020-11-21 | Disposition: A | Discharge status: Home / Self Care | Payer: Medicare Other | Source: Ambulatory Visit | Attending: Internal Medicine | Admitting: Internal Medicine

## 2020-11-21 DIAGNOSIS — N400 Enlarged prostate without lower urinary tract symptoms: Secondary | ICD-10-CM | POA: Diagnosis not present

## 2020-11-21 DIAGNOSIS — J90 Pleural effusion, not elsewhere classified: Secondary | ICD-10-CM | POA: Diagnosis not present

## 2020-11-21 DIAGNOSIS — C3490 Malignant neoplasm of unspecified part of unspecified bronchus or lung: Secondary | ICD-10-CM | POA: Insufficient documentation

## 2020-11-21 DIAGNOSIS — J9811 Atelectasis: Secondary | ICD-10-CM | POA: Diagnosis not present

## 2020-11-21 DIAGNOSIS — N3289 Other specified disorders of bladder: Secondary | ICD-10-CM | POA: Diagnosis not present

## 2020-11-21 DIAGNOSIS — N210 Calculus in bladder: Secondary | ICD-10-CM | POA: Diagnosis not present

## 2020-11-21 DIAGNOSIS — K5790 Diverticulosis of intestine, part unspecified, without perforation or abscess without bleeding: Secondary | ICD-10-CM | POA: Diagnosis not present

## 2020-11-21 DIAGNOSIS — J929 Pleural plaque without asbestos: Secondary | ICD-10-CM | POA: Diagnosis not present

## 2020-11-21 MED ORDER — IOHEXOL 300 MG/ML  SOLN
100.0000 mL | Freq: Once | INTRAMUSCULAR | Status: AC | PRN
  Administered 2020-11-21: 100 mL via INTRAVENOUS

## 2020-11-22 ENCOUNTER — Inpatient Hospital Stay: Payer: Medicare Other | Attending: Internal Medicine

## 2020-11-22 ENCOUNTER — Other Ambulatory Visit: Payer: Self-pay

## 2020-11-22 DIAGNOSIS — C7989 Secondary malignant neoplasm of other specified sites: Secondary | ICD-10-CM | POA: Diagnosis not present

## 2020-11-22 DIAGNOSIS — C7951 Secondary malignant neoplasm of bone: Secondary | ICD-10-CM | POA: Diagnosis not present

## 2020-11-22 DIAGNOSIS — C342 Malignant neoplasm of middle lobe, bronchus or lung: Secondary | ICD-10-CM | POA: Diagnosis not present

## 2020-11-22 DIAGNOSIS — Z79899 Other long term (current) drug therapy: Secondary | ICD-10-CM | POA: Diagnosis not present

## 2020-11-22 DIAGNOSIS — C7931 Secondary malignant neoplasm of brain: Secondary | ICD-10-CM | POA: Insufficient documentation

## 2020-11-22 DIAGNOSIS — C787 Secondary malignant neoplasm of liver and intrahepatic bile duct: Secondary | ICD-10-CM | POA: Diagnosis not present

## 2020-11-22 DIAGNOSIS — C3490 Malignant neoplasm of unspecified part of unspecified bronchus or lung: Secondary | ICD-10-CM

## 2020-11-22 LAB — CMP (CANCER CENTER ONLY)
ALT: 17 U/L (ref 0–44)
AST: 22 U/L (ref 15–41)
Albumin: 3.5 g/dL (ref 3.5–5.0)
Alkaline Phosphatase: 82 U/L (ref 38–126)
Anion gap: 11 (ref 5–15)
BUN: 24 mg/dL — ABNORMAL HIGH (ref 8–23)
CO2: 27 mmol/L (ref 22–32)
Calcium: 8.7 mg/dL — ABNORMAL LOW (ref 8.9–10.3)
Chloride: 103 mmol/L (ref 98–111)
Creatinine: 1.45 mg/dL — ABNORMAL HIGH (ref 0.61–1.24)
GFR, Estimated: 52 mL/min — ABNORMAL LOW (ref >60)
Glucose, Bld: 113 mg/dL — ABNORMAL HIGH (ref 70–99)
Potassium: 3.8 mmol/L (ref 3.5–5.1)
Sodium: 141 mmol/L (ref 135–145)
Total Bilirubin: 0.4 mg/dL (ref 0.3–1.2)
Total Protein: 6.4 g/dL — ABNORMAL LOW (ref 6.5–8.1)

## 2020-11-22 LAB — CBC WITH DIFFERENTIAL (CANCER CENTER ONLY)
Abs Immature Granulocytes: 0.01 10*3/uL (ref 0.00–0.07)
Basophils Absolute: 0 10*3/uL (ref 0.0–0.1)
Basophils Relative: 1 %
Eosinophils Absolute: 0.1 10*3/uL (ref 0.0–0.5)
Eosinophils Relative: 2 %
HCT: 39 % (ref 39.0–52.0)
Hemoglobin: 12.8 g/dL — ABNORMAL LOW (ref 13.0–17.0)
Immature Granulocytes: 0 %
Lymphocytes Relative: 17 %
Lymphs Abs: 0.8 10*3/uL (ref 0.7–4.0)
MCH: 29.6 pg (ref 26.0–34.0)
MCHC: 32.8 g/dL (ref 30.0–36.0)
MCV: 90.1 fL (ref 80.0–100.0)
Monocytes Absolute: 0.3 10*3/uL (ref 0.1–1.0)
Monocytes Relative: 6 %
Neutro Abs: 3.6 10*3/uL (ref 1.7–7.7)
Neutrophils Relative %: 74 %
Platelet Count: 162 10*3/uL (ref 150–400)
RBC: 4.33 MIL/uL (ref 4.22–5.81)
RDW: 15.6 % — ABNORMAL HIGH (ref 11.5–15.5)
WBC Count: 4.8 10*3/uL (ref 4.0–10.5)
nRBC: 0 % (ref 0.0–0.2)

## 2020-11-26 ENCOUNTER — Other Ambulatory Visit: Payer: Self-pay

## 2020-11-26 ENCOUNTER — Inpatient Hospital Stay (HOSPITAL_BASED_OUTPATIENT_CLINIC_OR_DEPARTMENT_OTHER): Payer: Medicare Other | Admitting: Internal Medicine

## 2020-11-26 VITALS — BP 132/71 | HR 77 | Temp 98.7°F | Resp 16 | Ht 71.0 in | Wt 195.8 lb

## 2020-11-26 DIAGNOSIS — C342 Malignant neoplasm of middle lobe, bronchus or lung: Secondary | ICD-10-CM | POA: Diagnosis not present

## 2020-11-26 DIAGNOSIS — Z5111 Encounter for antineoplastic chemotherapy: Secondary | ICD-10-CM

## 2020-11-26 DIAGNOSIS — C3491 Malignant neoplasm of unspecified part of right bronchus or lung: Secondary | ICD-10-CM | POA: Diagnosis not present

## 2020-11-26 DIAGNOSIS — C7951 Secondary malignant neoplasm of bone: Secondary | ICD-10-CM | POA: Diagnosis not present

## 2020-11-26 DIAGNOSIS — C7931 Secondary malignant neoplasm of brain: Secondary | ICD-10-CM

## 2020-11-26 DIAGNOSIS — C787 Secondary malignant neoplasm of liver and intrahepatic bile duct: Secondary | ICD-10-CM | POA: Diagnosis not present

## 2020-11-26 DIAGNOSIS — C7989 Secondary malignant neoplasm of other specified sites: Secondary | ICD-10-CM | POA: Diagnosis not present

## 2020-11-26 DIAGNOSIS — Z79899 Other long term (current) drug therapy: Secondary | ICD-10-CM | POA: Diagnosis not present

## 2020-11-26 NOTE — Progress Notes (Signed)
[facility] [phone]   [phone]  OFFICE PROGRESS NOTE  Laurey Morale, MD [place] [address]  DIAGNOSIS: Stage IV (T2b, N2, M1c) presented with right middle lobe lung mass in addition to mediastinal lymphadenopathy as well as malignant right pleural effusion with pleural-based nodules and suspicious right hepatic lesion and the brain metastasis diagnosed in July 2020.  Molecular Biomarkers: [id number] (Exon 20 insertion) 0.2% Dacomitinib,Neratinib,Osimertinib  [id number] 0.1% None   PRIOR THERAPY: Systemic chemotherapy with carboplatin for AUC of 5, Alimta 500 mg/M2 and Keytruda 200 mg IV every 3 weeks.  First dose April 04, 2019.  Status post 21 cycles  Starting from cycle #5 the patient is on maintenance treatment with Alimta and Keytruda every 3 weeks.  Starting from cycle #19 he will be on single agent Alimta.  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  CURRENT THERAPY: Monocertinib (Axkibity) 094 mg p.o. daily.  First dose was June 29, 2020.  Status post 5 months of treatment.   INTERVAL HISTORY: Ethan Brown 70 y.o. male returns to the clinic today for follow-up visit.  The patient is feeling fine today with no concerning complaints except for the fatigue as well as dry nose and eyes.  He was supposed to see Dr. Lucia Gaskins recently for evaluation of the nasal dryness but his appointment was canceled and rescheduled to be done next week.  The patient has no current chest pain but has shortness of breath with exertion and mild cough with no hemoptysis.  He denied having any current nausea, vomiting, but has occasional diarrhea with certain foods and no constipation.  He denied having any headache or visual changes.  He has no fever or chills.  He continues to tolerate his treatment with Monocertinib (Axkibity) fairly well.  The patient is here today for evaluation  with repeat CT scan of the chest, abdomen pelvis for restaging of his disease.   MEDICAL HISTORY: Past Medical History:  Diagnosis Date  . Borderline hypertension   . Brain metastasis ([place]) dx'd 01/2019  . Detached vitreous humor, left   . Dyspnea   . ED (erectile dysfunction) of organic origin   . Hyperlipidemia   . Hypertension   . Lens subluxation, left   . Lung cancer ([place]) dx'd 01/2019   Stage IV  . Migraine headache    takes PRN Imitrex  . Pseudophakia   . TGA (transient global amnesia) 2017    ALLERGIES:  has No Known Allergies.  MEDICATIONS:  Current Outpatient Medications  Medication Sig Dispense Refill  . cyanocobalamin (,VITAMIN B-12,) 1000 MCG/ML injection INJECT 1 ML (1,000 MCG TOTAL) INTO THE MUSCLE ONCE FOR 1 DOSE. 5 mL 1  . hydrochlorothiazide (MICROZIDE) 12.5 MG capsule Take 1 capsule (12.5 mg total) by mouth 2 (two) times daily. 180 capsule 2  . hydrocortisone 1 % lotion Apply 1 application topically 2 (two) times daily. 113 g 0  . lidocaine-prilocaine (EMLA) cream Apply to the Port-A-Cath site 30-60 minutes before treatment 30 g 0  . LORazepam (ATIVAN) 1 MG tablet Take 1 tablet (1 mg total) by mouth as needed for anxiety (30 minutes prior to radiation treatment and may repeat just prior to procedure if needed.). 10 tablet 0  . magnesium oxide (MAG-OX) 400 MG tablet Take 1 tablet (400 mg total) by mouth daily. 30 tablet 1  . Mobocertinib Succinate (EXKIVITY) 40 MG CAPS Take 160 mg by mouth every morning. [place]  capsule 3  . Multiple Vitamin (MULTIVITAMIN WITH MINERALS) TABS tablet Take 1 tablet by mouth daily.    Marland Kitchen omeprazole (PRILOSEC OTC) 20 MG tablet Take 20 mg by mouth daily.    . prochlorperazine (COMPAZINE) 10 MG tablet Take 1 tablet (10 mg total) by mouth every 6 (six) hours as needed for nausea or vomiting. 30 tablet 1  . sildenafil (REVATIO) 20 MG tablet TAKE AS DIRECTED 10 tablet 11  . sulfamethoxazole-trimethoprim (BACTRIM DS) 800-160 MG tablet Take 1  tablet by mouth 2 (two) times daily. (Patient not taking: No sig reported) 14 tablet 0  . tamsulosin (FLOMAX) 0.4 MG CAPS capsule Take 0.4 mg by mouth daily.     No current facility-administered medications for this visit.    SURGICAL HISTORY:  Past Surgical History:  Procedure Laterality Date  . CAROTID DOPPLERS  09/2017   Mild non-obstructive disease  . CATARACT EXTRACTION, BILATERAL    . cataract, left  2018  . CHEST TUBE INSERTION Right 03/29/2019   Procedure: INSERTION PLEURAL DRAINAGE CATHETER;  Surgeon: [name], MD;  Location: [facility];  Service: Thoracic;  Laterality: Right;  . COLONOSCOPY  2016   clear, repeat in 10 yrs   . Eye surgeries     , Lens attachment.  Vitrectomy  . FEMORAL HERNIA REPAIR    . HERNIA REPAIR    . IR THORACENTESIS ASP PLEURAL SPACE W/IMG GUIDE  02/22/2019  . IR THORACENTESIS ASP PLEURAL SPACE W/IMG GUIDE  03/13/2019  . IR THORACENTESIS ASP PLEURAL SPACE W/IMG GUIDE  03/18/2020  . LUMBAR LAMINECTOMY    . PLEURAL BIOPSY Right 03/29/2019   Procedure: PLEURAL BIOPSY;  Surgeon: [name], MD;  Location: [facility];  Service: Thoracic;  Laterality: Right;  . PORTACATH PLACEMENT N/A 03/29/2019   Procedure: INSERTION PORT-A-CATH;  Surgeon: [name], MD;  Location: [facility];  Service: Thoracic;  Laterality: N/A;  . retinal attachment, right    . sclearl buckle, right    . TRANSTHORACIC ECHOCARDIOGRAM  12/14/2018   EF 50 EF 55% (low normal).  Abnormal septal wall motion related to BBB; indeterminate diastolic parameters.  Mild RV enlargement with mildly reduced function.  Normal valves.  Normal atrial sizes.  . victrectomy,right    . VIDEO ASSISTED THORACOSCOPY Right 03/29/2019   Procedure: VIDEO ASSISTED THORACOSCOPY;  Surgeon: [name], MD;  Location: [facility];  Service: Thoracic;  Laterality: Right;    REVIEW OF SYSTEMS:  Constitutional: positive for fatigue Eyes: positive for Dryness Ears, nose, mouth, throat, and face:  positive for Dryness of the nasal mucosa Respiratory: positive for cough and dyspnea on exertion Cardiovascular: negative Gastrointestinal: positive for diarrhea Genitourinary:negative Integument/breast: negative Hematologic/lymphatic: negative Musculoskeletal:negative Neurological: negative Behavioral/Psych: negative Endocrine: negative Allergic/Immunologic: negative   PHYSICAL EXAMINATION: General appearance: alert, cooperative, fatigued and no distress Head: Normocephalic, without obvious abnormality, atraumatic Neck: no adenopathy, no JVD, supple, symmetrical, trachea midline and thyroid not enlarged, symmetric, no tenderness/mass/nodules Lymph nodes: Cervical, supraclavicular, and axillary nodes normal. Resp: clear to auscultation bilaterally Back: symmetric, no curvature. ROM normal. No CVA tenderness. Cardio: regular rate and rhythm, S1, S2 normal, no murmur, click, rub or gallop GI: soft, non-tender; bowel sounds normal; no masses,  no organomegaly Extremities: extremities normal, atraumatic, no cyanosis or edema Neurologic: Alert and oriented X 3, normal strength and tone. Normal symmetric reflexes. Normal coordination and gait  ECOG PERFORMANCE STATUS: 1 - Symptomatic but completely ambulatory  Blood pressure 132/71, pulse 77, temperature 98.7 F (37.1 C), temperature  source Tympanic, resp. rate 16, height '5\' 11"'  (1.803 m), weight 195 lb 12.8 oz (88.8 kg), SpO2 99 %.  LABORATORY DATA: Lab Results  Component Value Date   WBC 4.8 11/22/2020   HGB 12.8 (L) 11/22/2020   HCT 39.0 11/22/2020   MCV 90.1 11/22/2020   PLT 162 11/22/2020      Chemistry      Component Value Date/Time   NA 141 11/22/2020 1311   K 3.8 11/22/2020 1311   CL 103 11/22/2020 1311   CO2 27 11/22/2020 1311   BUN 24 (H) 11/22/2020 1311   CREATININE 1.45 (H) 11/22/2020 1311      Component Value Date/Time   CALCIUM 8.7 (L) 11/22/2020 1311   ALKPHOS 82 11/22/2020 1311   AST 22 11/22/2020 1311    ALT 17 11/22/2020 1311   BILITOT 0.4 11/22/2020 1311       RADIOGRAPHIC STUDIES: CT Chest W Contrast  Result Date: 11/22/2020 CLINICAL DATA:  Lung cancer, chemotherapy in progress. EXAM: CT CHEST, ABDOMEN, AND PELVIS WITH CONTRAST TECHNIQUE: Multidetector CT imaging of the chest, abdomen and pelvis was performed following the standard protocol during bolus administration of intravenous contrast. CONTRAST:  115m OMNIPAQUE IOHEXOL 300 MG/ML  SOLN COMPARISON:  08/22/2020. FINDINGS: CT CHEST FINDINGS Cardiovascular: Right IJ Port-A-Cath terminates in the SVC. Heart size normal. No pericardial effusion. Mediastinum/Nodes: Mediastinal lymph nodes measure up to 10 mm in the low right paratracheal station, similar. No hilar or axillary adenopathy. Esophagus is grossly unremarkable. Lungs/Pleura: Mild scarring in the apices. Nodularity and thickening along the minor fissure and right major fissure, similar. Dominant spiculated nodule in the juxta mediastinal medial segment right middle lobe measures 1.2 x 1.7 cm (6/91), slightly decreased from 2.1 x 2.1 cm on 08/22/2020. 9 mm nodule in the lateral segment right middle lobe (6/102) has enlarged from 6 mm on 08/22/2020. Similarly, additional nodularity in the inferior right middle lobe has enlarged, now measuring 0.9 x 2.1 cm (6/123), compared to 0.7 x 1.5 cm on 08/22/2020. Previously seen patchy ground-glass in the right lower lobe has largely resolved. Extensive septal thickening in the right middle and right lower lobes. Compressive atelectasis in the right lower lobe. Moderate complex right pleural effusion with associated pleural thickening, similar. 3 mm subpleural anterior left upper lobe nodule (6/83), unchanged. Trace residual left pleural effusion, decreased. Airway is unremarkable. Musculoskeletal: Scattered sclerotic lesions are stable. CT ABDOMEN PELVIS FINDINGS Hepatobiliary: Liver is enlarged, 20.2 cm. Liver and gallbladder are otherwise unremarkable.  Pancreas: Negative. Spleen: Negative. Adrenals/Urinary Tract: Adrenal glands are unremarkable. Subcentimeter low-attenuation lesion in the right kidney is too small to characterize. Kidneys are otherwise unremarkable. Ureters are decompressed. Tiny calcifications are seen along the dependent aspect of the bladder. Slight irregular bladder wall thickening. There are few scattered diverticula. Prostate is enlarged and indents the bladder. Stomach/Bowel: Stomach and small bowel are unremarkable. Appendix is not readily visualized. Cecum is in the right upper quadrant. Fair amount of stool in the colon is indicative of constipation. Vascular/Lymphatic: Vascular structures are unremarkable. No pathologically enlarged lymph nodes. Reproductive: Prostate is enlarged and indents the bladder. Other: No free fluid. Mild mesenteric haziness and nodularity, unchanged. Musculoskeletal: Scattered sclerotic lesions are unchanged. Minimal retrolisthesis of L5 on S1. IMPRESSION: 1. Although the dominant juxta mediastinal right middle lobe nodule has decreased slightly in size, pleural and parenchymal nodularity in the right hemithorax has enlarged slightly. 2. Septal thickening in the right middle and right lower lobes, as before, worrisome for lymphangitic carcinomatosis. 3. Stable  osseous metastatic disease. 4. Chronic appearing moderate right pleural fluid collection. 5. Trace residual left pleural effusion. 6. Hepatomegaly. 7. Bladder wall thickening and diverticula, likely related to an enlarged prostate. Associated tiny bladder stones. Electronically Signed   By: Lorin Picket M.D.   On: 11/22/2020 09:16   CT Abdomen Pelvis W Contrast  Result Date: 11/22/2020 CLINICAL DATA:  Lung cancer, chemotherapy in progress. EXAM: CT CHEST, ABDOMEN, AND PELVIS WITH CONTRAST TECHNIQUE: Multidetector CT imaging of the chest, abdomen and pelvis was performed following the standard protocol during bolus administration of intravenous  contrast. CONTRAST:  113m OMNIPAQUE IOHEXOL 300 MG/ML  SOLN COMPARISON:  08/22/2020. FINDINGS: CT CHEST FINDINGS Cardiovascular: Right IJ Port-A-Cath terminates in the SVC. Heart size normal. No pericardial effusion. Mediastinum/Nodes: Mediastinal lymph nodes measure up to 10 mm in the low right paratracheal station, similar. No hilar or axillary adenopathy. Esophagus is grossly unremarkable. Lungs/Pleura: Mild scarring in the apices. Nodularity and thickening along the minor fissure and right major fissure, similar. Dominant spiculated nodule in the juxta mediastinal medial segment right middle lobe measures 1.2 x 1.7 cm (6/91), slightly decreased from 2.1 x 2.1 cm on 08/22/2020. 9 mm nodule in the lateral segment right middle lobe (6/102) has enlarged from 6 mm on 08/22/2020. Similarly, additional nodularity in the inferior right middle lobe has enlarged, now measuring 0.9 x 2.1 cm (6/123), compared to 0.7 x 1.5 cm on 08/22/2020. Previously seen patchy ground-glass in the right lower lobe has largely resolved. Extensive septal thickening in the right middle and right lower lobes. Compressive atelectasis in the right lower lobe. Moderate complex right pleural effusion with associated pleural thickening, similar. 3 mm subpleural anterior left upper lobe nodule (6/83), unchanged. Trace residual left pleural effusion, decreased. Airway is unremarkable. Musculoskeletal: Scattered sclerotic lesions are stable. CT ABDOMEN PELVIS FINDINGS Hepatobiliary: Liver is enlarged, 20.2 cm. Liver and gallbladder are otherwise unremarkable. Pancreas: Negative. Spleen: Negative. Adrenals/Urinary Tract: Adrenal glands are unremarkable. Subcentimeter low-attenuation lesion in the right kidney is too small to characterize. Kidneys are otherwise unremarkable. Ureters are decompressed. Tiny calcifications are seen along the dependent aspect of the bladder. Slight irregular bladder wall thickening. There are few scattered diverticula.  Prostate is enlarged and indents the bladder. Stomach/Bowel: Stomach and small bowel are unremarkable. Appendix is not readily visualized. Cecum is in the right upper quadrant. Fair amount of stool in the colon is indicative of constipation. Vascular/Lymphatic: Vascular structures are unremarkable. No pathologically enlarged lymph nodes. Reproductive: Prostate is enlarged and indents the bladder. Other: No free fluid. Mild mesenteric haziness and nodularity, unchanged. Musculoskeletal: Scattered sclerotic lesions are unchanged. Minimal retrolisthesis of L5 on S1. IMPRESSION: 1. Although the dominant juxta mediastinal right middle lobe nodule has decreased slightly in size, pleural and parenchymal nodularity in the right hemithorax has enlarged slightly. 2. Septal thickening in the right middle and right lower lobes, as before, worrisome for lymphangitic carcinomatosis. 3. Stable osseous metastatic disease. 4. Chronic appearing moderate right pleural fluid collection. 5. Trace residual left pleural effusion. 6. Hepatomegaly. 7. Bladder wall thickening and diverticula, likely related to an enlarged prostate. Associated tiny bladder stones. Electronically Signed   By: MLorin PicketM.D.   On: 11/22/2020 09:16    ASSESSMENT AND PLAN: This is a very pleasant 70years old white male recently diagnosed with a stage IV (T2b, N2, M1C) non-small cell lung cancer, adenocarcinoma with positive EGFR mutation in exon 20 (resistant mutation) diagnosed in July 2020 and presented with extensive disease involving the right upper lobe  as well as the right middle and lower lobe with extensive pleural based metastasis as well as mediastinal and hilar disease with malignant pleural effusion as well as liver and brain metastasis. Unfortunately the patient has no actionable mutations based on the molecular studies by [facility].   The patient is currently undergoing systemic chemotherapy with carboplatin for AUC of 5, Alimta 500  mg/M2 and Keytruda 200 mg IV every 3 weeks status post 21 cycles.  Starting from cycle #5 the patient is on maintenance treatment with Alimta and Keytruda. Beryle Flock was discontinued after cycle #18 secondary to recurrent immunotherapy mediated pneumonitis. Starting from cycle #19 the patient will be on treatment with single agent Alimta every 3 weeks. The patient has been tolerating the treatment well. He had repeat PET scan and MRI of the brain performed recently.  The PET scan showed evidence for disease progression with metastatic disease involving the thoracic and abdominal lymph nodes as well as the right hemithorax and bones.  He also has loculated moderate right pleural effusion and small left pleural effusion.  The PET scan also showed prostate enlargement and his PSA has been rising recently.  He was seen by urology. MRI of the brain showed development of new 5 mm right temporal lesion suspicious for metastatic disease as well as new 2 mm focus of enhancement in the left periventricular white matter also suspicious for metastatic disease.  He was seen by radiation oncology and expected to have [place] to this lesion soon. The patient is currently on treatment with Monocertinib (Axkibity) 500 mg p.o. daily.  Status post 4 months of treatment.  He has been tolerating the treatment well with no concerning adverse effects except for the dryness of his nose that is not responding to the over-the-counter medication.  He also has occasional episodes of diarrhea with certain foods. He had repeat CT scan of the chest, abdomen pelvis performed recently.  I personally and independently reviewed the scan images and discussed the results with the patient today. His scan showed decrease in the dominant mass but there was mild increase in some of the other pulmonary nodules with a stable bone metastasis. I recommended for the patient to continue his current treatment with Monocertinib (Axkibity) with the same dose  for now. If he has any evidence for disease progression in the future, I may consider switching the patient to Amivantamab as an alternative treatment. For the nasal dryness and congestion he was referred to Dr. Lucia Gaskins but has not seen him yet. I will see him back for follow-up visit in 1 months for evaluation and repeat blood work. The patient was advised to call immediately if he has any other concerning symptoms in the interval.  The patient voices understanding of current disease status and treatment options and is in agreement with the current care plan. All questions were answered. The patient knows to call the clinic with any problems, questions or concerns. We can certainly see the patient much sooner if necessary.  Disclaimer: This note was dictated with voice recognition software. Similar sounding words can inadvertently be transcribed and may not be corrected upon review.

## 2020-11-29 ENCOUNTER — Telehealth: Payer: Self-pay | Admitting: Internal Medicine

## 2020-11-29 NOTE — Telephone Encounter (Signed)
Scheduled per los. Called and left msg. Mailed printout  °

## 2020-12-02 ENCOUNTER — Other Ambulatory Visit: Payer: Self-pay

## 2020-12-02 ENCOUNTER — Telehealth: Payer: Self-pay

## 2020-12-02 ENCOUNTER — Encounter: Payer: Self-pay | Admitting: Urology

## 2020-12-02 NOTE — Telephone Encounter (Signed)
Spoke with patient in regards to telephone visit with Freeman Caldron PA on 12/11/20 @ 10:30am. Patient verbalized understanding of appointment date and time. Called and reviewed meaningful use questions. TM

## 2020-12-05 ENCOUNTER — Ambulatory Visit
Admission: RE | Admit: 2020-12-05 | Discharge: 2020-12-05 | Disposition: A | Discharge status: Home / Self Care | Payer: Medicare Other | Source: Ambulatory Visit | Attending: Radiation Oncology | Admitting: Radiation Oncology

## 2020-12-05 DIAGNOSIS — C3490 Malignant neoplasm of unspecified part of unspecified bronchus or lung: Secondary | ICD-10-CM | POA: Diagnosis not present

## 2020-12-05 DIAGNOSIS — C7931 Secondary malignant neoplasm of brain: Secondary | ICD-10-CM

## 2020-12-05 MED ORDER — GADOBENATE DIMEGLUMINE 529 MG/ML IV SOLN
19.0000 mL | Freq: Once | INTRAVENOUS | Status: AC | PRN
  Administered 2020-12-05: 19 mL via INTRAVENOUS

## 2020-12-06 ENCOUNTER — Ambulatory Visit (INDEPENDENT_AMBULATORY_CARE_PROVIDER_SITE_OTHER): Payer: Medicare Other | Admitting: Otolaryngology

## 2020-12-06 ENCOUNTER — Encounter (INDEPENDENT_AMBULATORY_CARE_PROVIDER_SITE_OTHER): Payer: Self-pay | Admitting: Otolaryngology

## 2020-12-06 ENCOUNTER — Other Ambulatory Visit: Payer: Self-pay

## 2020-12-06 VITALS — Temp 97.3°F

## 2020-12-06 DIAGNOSIS — J3489 Other specified disorders of nose and nasal sinuses: Secondary | ICD-10-CM

## 2020-12-06 DIAGNOSIS — J310 Chronic rhinitis: Secondary | ICD-10-CM

## 2020-12-06 NOTE — Progress Notes (Signed)
HPI: Ethan Brown is a 70 y.o. male who presents is referred by Dr. Earlie Server for evaluation of complaints of dryness in his nose as well as chronic scabbing and crusting in the nostrils.  Patient has a history of lung cancer diagnosed almost 2 years ago.  He was diagnosed with stage IV lung cancer and has been receiving chemotherapy.  He has been tried on some new chemotherapeutic or immunologic agents that apparently have caused him to have more dryness of his tears as well as his nose.  He has tried saline rinse as well as nasal gel.  The nostrils frequently get tender.  Early on he was having some trouble breathing but he is breathing better presently... He feels like steam helps his nose the most but this is only temporary.  Past Medical History:  Diagnosis Date  . Borderline hypertension   . Brain metastasis ([place]) dx'd 01/2019  . Detached vitreous humor, left   . Dyspnea   . ED (erectile dysfunction) of organic origin   . Hyperlipidemia   . Hypertension   . Lens subluxation, left   . Lung cancer ([place]) dx'd 01/2019   Stage IV  . Migraine headache    takes PRN Imitrex  . Pseudophakia   . TGA (transient global amnesia) 2017   Past Surgical History:  Procedure Laterality Date  . CAROTID DOPPLERS  09/2017   Mild non-obstructive disease  . CATARACT EXTRACTION, BILATERAL    . cataract, left  2018  . CHEST TUBE INSERTION Right 03/29/2019   Procedure: INSERTION PLEURAL DRAINAGE CATHETER;  Surgeon: [name], MD;  Location: [facility];  Service: Thoracic;  Laterality: Right;  . COLONOSCOPY  2016   clear, repeat in 10 yrs   . Eye surgeries     , Lens attachment.  Vitrectomy  . FEMORAL HERNIA REPAIR    . HERNIA REPAIR    . IR THORACENTESIS ASP PLEURAL SPACE W/IMG GUIDE  02/22/2019  . IR THORACENTESIS ASP PLEURAL SPACE W/IMG GUIDE  03/13/2019  . IR THORACENTESIS ASP PLEURAL SPACE W/IMG GUIDE  03/18/2020  . LUMBAR LAMINECTOMY    . PLEURAL BIOPSY Right 03/29/2019   Procedure:  PLEURAL BIOPSY;  Surgeon: [name], MD;  Location: [facility];  Service: Thoracic;  Laterality: Right;  . PORTACATH PLACEMENT N/A 03/29/2019   Procedure: INSERTION PORT-A-CATH;  Surgeon: [name], MD;  Location: [facility];  Service: Thoracic;  Laterality: N/A;  . retinal attachment, right    . sclearl buckle, right    . TRANSTHORACIC ECHOCARDIOGRAM  12/14/2018   EF 50 EF 55% (low normal).  Abnormal septal wall motion related to BBB; indeterminate diastolic parameters.  Mild RV enlargement with mildly reduced function.  Normal valves.  Normal atrial sizes.  . victrectomy,right    . VIDEO ASSISTED THORACOSCOPY Right 03/29/2019   Procedure: VIDEO ASSISTED THORACOSCOPY;  Surgeon: [name], MD;  Location: [facility] OR;  Service: Thoracic;  Laterality: Right;   Social History   Socioeconomic History  . Marital status: Married    Spouse name: [name]  . Number of children: 3  . Years of education: Not on file  . Highest education level: Master's degree (e.g., MA, MS, MEng, MEd, MSW, MBA)  Occupational History  . Occupation: retired Optometrist  Tobacco Use  . Smoking status: Never Smoker  . Smokeless tobacco: Never Used  Vaping Use  . Vaping Use: Never used  Substance and Sexual Activity  . Alcohol use: Yes    Alcohol/week: 1.0  standard drink    Types: 1 Glasses of wine per week  . Drug use: No  . Sexual activity: Not Currently  Other Topics Concern  . Not on file  Social History Narrative   Married father of 15, grandfather 52.  Lives with his wife, [name] they recently moved to [place] after living in [place].      He is a retired Engineer, maintenance (IT)   Was previously on an exercise regimen at [employer] 3 days a week for 90 minutes at a time --> he is now hoping to get back into that plan, but is currently now riding his bike at least 30 to 40 minutes a day for 5 days a week and then doing longer walks about 6 days a week.   Social Determinants of Health    Financial Resource Strain: Not on file  Food Insecurity: Not on file  Transportation Needs: Not on file  Physical Activity: Not on file  Stress: Not on file  Social Connections: Not on file   Family History  Problem Relation Age of Onset  . Alcohol abuse Mother   . Diabetes Father   . Hypertension Father   . Prostate cancer Father   . Cancer Father   . Heart attack Maternal Grandfather 78   No Known Allergies Prior to Admission medications   Medication Sig Start Date End Date Taking? Authorizing Provider  cyanocobalamin (,VITAMIN B-12,) 1000 MCG/ML injection INJECT 1 ML (1,000 MCG TOTAL) INTO THE MUSCLE ONCE FOR 1 DOSE. 12/26/19   Laurey Morale, MD  hydrochlorothiazide (MICROZIDE) 12.5 MG capsule Take 1 capsule (12.5 mg total) by mouth 2 (two) times daily. 01/25/20   Lendon Colonel, NP  hydrocortisone 1 % lotion Apply 1 application topically 2 (two) times daily. 07/15/20   Heilingoetter, Cassandra L, PA-C  lidocaine-prilocaine (EMLA) cream Apply to the Port-A-Cath site 30-60 minutes before treatment 03/28/19   Curt Bears, MD  LORazepam (ATIVAN) 1 MG tablet Take 1 tablet (1 mg total) by mouth as needed for anxiety (30 minutes prior to radiation treatment and may repeat just prior to procedure if needed.). 06/25/20   Bruning, Ashlyn, PA-C  magnesium oxide (MAG-OX) 400 MG tablet Take 1 tablet (400 mg total) by mouth daily. 09/30/20   Heilingoetter, Cassandra L, PA-C  Mobocertinib Succinate (EXKIVITY) 40 MG CAPS Take 160 mg by mouth every morning. 11/08/20   Curt Bears, MD  Multiple Vitamin (MULTIVITAMIN WITH MINERALS) TABS tablet Take 1 tablet by mouth daily.    [provider]  omeprazole (PRILOSEC OTC) 20 MG tablet Take 20 mg by mouth daily.    [provider]  prochlorperazine (COMPAZINE) 10 MG tablet Take 1 tablet (10 mg total) by mouth every 6 (six) hours as needed for nausea or vomiting. 05/21/20   Curt Bears, MD  sildenafil (REVATIO) 20 MG tablet  TAKE AS DIRECTED 08/15/19   Laurey Morale, MD  sulfamethoxazole-trimethoprim (BACTRIM DS) 800-160 MG tablet Take 1 tablet by mouth 2 (two) times daily. Patient not taking: No sig reported 07/26/20   Harle Stanford., PA-C  tamsulosin (FLOMAX) 0.4 MG CAPS capsule Take 0.4 mg by mouth daily. 12/22/19   [provider]     Positive ROS: Otherwise negativ  All other systems have been reviewed and were otherwise negative with the exception of those mentioned in the HPI and as above.  Physical Exam: Constitutional: Alert, well-appearing, no acute distress Ears: External ears without lesions or tenderness. Ear canals are clear bilaterally with  intact, clear TMs.  Nasal: External nose without lesions. Septum with mild deformity..  Nostrils with crusting and scabbing on both sides but much worse on the right side today.  There is some surrounding erythema in the right nostril which is very tender.  More posteriorly within the nasal cavity on anterior rhinoscopy nasal passages were clear.  The middle meatus regions were clear with no obvious mucopurulent discharge noted.  He describes more of a clear mucus discharge from his nose.  No intranasal lesions noted. Oral: Lips and gums without lesions. Tongue and palate mucosa without lesions. Posterior oropharynx clear. Neck: No palpable adenopathy or masses Respiratory: Breathing comfortably  Skin: No facial/neck lesions or rash noted.  Procedures  Assessment: Presently with right nasal vestibulitis Chronic rhinitis.  Plan: Presently has some mild erythema in vestibulitis involving the right nostril.  He has mild crusting on both nostrils but no erythema involving the left nostril.  Nasal passages are clear otherwise. I recommended use of mupirocin 2% ointment application twice daily to the nostril until the erythema and soreness resolves. Otherwise discussed with him concerning use of nasal gel spray which he already uses and/or nasal gel  lubrication that he can apply to the nostril. Nasal passages are clear otherwise. He will follow-up as needed.   Radene Journey, MD   CC:

## 2020-12-09 ENCOUNTER — Inpatient Hospital Stay: Payer: Medicare Other

## 2020-12-11 ENCOUNTER — Ambulatory Visit
Admission: RE | Admit: 2020-12-11 | Discharge: 2020-12-11 | Disposition: A | Discharge status: Home / Self Care | Payer: Medicare Other | Source: Ambulatory Visit | Attending: Urology | Admitting: Urology

## 2020-12-11 ENCOUNTER — Other Ambulatory Visit: Payer: Self-pay

## 2020-12-11 DIAGNOSIS — C3490 Malignant neoplasm of unspecified part of unspecified bronchus or lung: Secondary | ICD-10-CM

## 2020-12-11 DIAGNOSIS — C342 Malignant neoplasm of middle lobe, bronchus or lung: Secondary | ICD-10-CM | POA: Diagnosis not present

## 2020-12-11 DIAGNOSIS — C7931 Secondary malignant neoplasm of brain: Secondary | ICD-10-CM | POA: Diagnosis not present

## 2020-12-12 ENCOUNTER — Ambulatory Visit
Admission: RE | Admit: 2020-12-12 | Discharge: 2020-12-12 | Disposition: A | Discharge status: Home / Self Care | Payer: Medicare Other | Source: Ambulatory Visit | Attending: Radiation Oncology | Admitting: Radiation Oncology

## 2020-12-12 ENCOUNTER — Other Ambulatory Visit: Payer: Self-pay

## 2020-12-12 DIAGNOSIS — C342 Malignant neoplasm of middle lobe, bronchus or lung: Secondary | ICD-10-CM | POA: Diagnosis not present

## 2020-12-12 DIAGNOSIS — C7931 Secondary malignant neoplasm of brain: Secondary | ICD-10-CM | POA: Insufficient documentation

## 2020-12-12 DIAGNOSIS — Z510 Encounter for antineoplastic radiation therapy: Secondary | ICD-10-CM | POA: Diagnosis not present

## 2020-12-12 DIAGNOSIS — C3490 Malignant neoplasm of unspecified part of unspecified bronchus or lung: Secondary | ICD-10-CM

## 2020-12-12 MED ORDER — MEMANTINE HCL 10 MG PO TABS: 10.0000 mg | ORAL_TABLET | ORAL | 0 refills | Status: DC

## 2020-12-12 MED ORDER — MEMANTINE HCL 10 MG PO TABS: 10.0000 mg | ORAL_TABLET | Freq: Two times a day (BID) | ORAL | 4 refills | Status: DC

## 2020-12-12 NOTE — Progress Notes (Signed)
  Radiation Oncology         [phone] ________________________________  Name: Ethan Brown MRN: [id number]  Date: 12/12/2020  DOB: 1951-08-23  SIMULATION AND TREATMENT PLANNING NOTE    ICD-10-CM   1. Primary malignant neoplasm of lung with metastasis to brain [facility])  C34.90    C79.31     DIAGNOSIS:  70 yo male with numerous brain metastases from Stage IV (T2b, N2, M1c) adenocarcinoma of the right middle lobe of the lung.   NARRATIVE:  The patient was brought to the [facility].  Identity was confirmed.  All relevant records and images related to the planned course of therapy were reviewed.  The patient freely provided informed written consent to proceed with treatment after reviewing the details related to the planned course of therapy. The consent form was witnessed and verified by the simulation staff.  Then, the patient was set-up in a stable reproducible  supine position for radiation therapy.  CT images were obtained.  Surface markings were placed.  The CT images were loaded into the planning software.  Then the target and avoidance structures were contoured.  Treatment planning then occurred.  The radiation prescription was entered and confirmed.  Then, I designed and supervised the construction of a total of 1 medically necessary complex treatment device as a custom made thermoplastic mask used for immobilization   I have requested : Intensity Modulated Radiation Therapy (IMRT) plan for the purpose of hippocampal avoidance to help reduce memory loss.    PLAN:  The whole brain will be treated to 30 Gy in 10 fractions.  ________________________________  Sheral Apley Tammi Klippel, M.D.   References for Hippocampal Avoidance:  J Clin Oncol. 2022 Feb 10;40(5):492-516. doi: 10.1200/JCO.21.02314. Epub 2021 Dec 21. Treatment for Brain Metastases: ASCO-SNO-ASTRO Guideline [email or web address]

## 2020-12-16 DIAGNOSIS — C342 Malignant neoplasm of middle lobe, bronchus or lung: Secondary | ICD-10-CM | POA: Diagnosis not present

## 2020-12-16 DIAGNOSIS — C7931 Secondary malignant neoplasm of brain: Secondary | ICD-10-CM | POA: Diagnosis not present

## 2020-12-16 DIAGNOSIS — Z510 Encounter for antineoplastic radiation therapy: Secondary | ICD-10-CM | POA: Diagnosis not present

## 2020-12-17 DIAGNOSIS — Z23 Encounter for immunization: Secondary | ICD-10-CM | POA: Diagnosis not present

## 2020-12-18 ENCOUNTER — Ambulatory Visit
Admission: RE | Admit: 2020-12-18 | Discharge: 2020-12-18 | Disposition: A | Discharge status: Home / Self Care | Payer: Medicare Other | Source: Ambulatory Visit | Attending: Radiation Oncology | Admitting: Radiation Oncology

## 2020-12-18 ENCOUNTER — Other Ambulatory Visit: Payer: Self-pay

## 2020-12-18 DIAGNOSIS — C342 Malignant neoplasm of middle lobe, bronchus or lung: Secondary | ICD-10-CM | POA: Diagnosis not present

## 2020-12-18 DIAGNOSIS — C7931 Secondary malignant neoplasm of brain: Secondary | ICD-10-CM | POA: Diagnosis not present

## 2020-12-18 DIAGNOSIS — Z510 Encounter for antineoplastic radiation therapy: Secondary | ICD-10-CM | POA: Diagnosis not present

## 2020-12-18 NOTE — Progress Notes (Signed)
.  Radiation Oncology         [phone]) [phone] ________________________________  Name: Ethan Brown MRN: [id number]  Date: 12/11/2020  DOB: 02/13/1951  Post Treatment Note  CC: Laurey Morale, MD  Curt Bears, MD  Diagnosis:   70 yo male with brain metastases from Stage IV (T2b, N2, M1c) adenocarcinoma of the right middle lobe of the lung.    Interval Since Last Radiation:  5 months 07/03/20: SRS//PTV3: 68mm right temporal gyrus target was treated to a prescription dose of 20 Gy in a single fraction.   SRS//PTV4: 46mm left periventricular target was treated to a prescription dose of 20 Gy in a single fraction.     01/23/2020: SRS//PTV2: 71mm right thalamocapsular target was treated to a prescription dose of 20 Gy in a single fraction.    04/07/19: SRS// PTV1:  6 mm  right parietal target was treated to a prescription dose of 20 Gy in a single fraction.    Narrative:  I spoke with the patient to conduct his routine scheduled 3 month follow up visit via telephone to spare the patient unnecessary potential exposure in the healthcare setting during the current COVID-19 pandemic.  The patient was notified in advance and gave permission to proceed with this visit format.   He has recovered well from the effects of his recent radiotherapy and is without complaints.     He was initially on systemic chemotherapy with carboplatin, Alimta and Keytruda every 3 weeks under the care and direction of Dr. Earlie Server and completed 4 cycles.  His restaging imaging showed a good response to treatment with a decrease in the right pleural effusion, decreased mediastinal and right hilar lymphadenopathy, and improvement in the liver lesion and right middle lobe pulmonary lesion so he was transitioned to maintenance therapy with Alimta and Keytruda, beginning with cycle 5 of his treatment on 06/27/2019. The Beryle Flock was discontinued after cycle 18 due to recurrent immunotherapy mediated pneumonitis.  Disease  restaging imaging with CT C/A/P from 02/09/20 showed continued disease stability so he proceeded with cycle #16 of maintenance treatment on 02/13/20.  He was on single agent Alimta beginning with cycle 19 of maintenance therapy but unfortunately, restaging scans in 05/2020 showed disease progression in the chest, abdomen and bones, confirmed on PET scan 06/18/20 as well as 2 new lesions in the brain, noted on follow up MRI brain scan from 06/20/20. The Alimta maintenance therapy was discontinued in 06/2020 and he was changed to Monocertinib (Axkibity) 295 mg p.o. daily which he is tolerating well.  He recently completed SRS to the 2 new brain lesions, performed on 07/03/20 and tolerated very well.  His posttreatment MRI brain scan from 10/04/20 showed stable-improved disease in the previously treated brain lesions but there was a new 2 mm focus of enhancement in the medial left frontal lobe, indeterminate for a metastasis versus vascular enhancement.  He presents today for follow-up to review results of the short interval MRI brain scan performed on 12/05/2020 to closely monitor the new lesion.            On review of systems, the patient states that he is doing very well overall.  He is currently without complaints and specifically denies headaches, changes in auditory or visual acuity, nausea, vomiting, dizziness, imbalance, tremors or seizure activity.  He has not had recent fevers, chills or night sweats.  He is tolerating his new systemic therapy quite well.  Overall, he is very pleased with his progress to  date.  His most recent MRI brain scan from 12/05/2020 shows interval progression of metastatic disease in the brain with numerous new, small enhancing lesions present in the cerebellum bilaterally as well as multiple new lesions present in both cerebral hemispheres and increased size of the left medial frontal lobe lesion that we were monitoring, now measuring 5 mm, compatible with metastatic disease.  This was  reviewed in our recent multidisciplinary brain clinic and the recommendation is to proceed with whole brain radiotherapy.  ALLERGIES:  has No Known Allergies.  Meds: Current Outpatient Medications  Medication Sig Dispense Refill  . cyanocobalamin (,VITAMIN B-12,) 1000 MCG/ML injection INJECT 1 ML (1,000 MCG TOTAL) INTO THE MUSCLE ONCE FOR 1 DOSE. 5 mL 1  . hydrochlorothiazide (MICROZIDE) 12.5 MG capsule Take 1 capsule (12.5 mg total) by mouth 2 (two) times daily. 180 capsule 2  . hydrocortisone 1 % lotion Apply 1 application topically 2 (two) times daily. 113 g 0  . lidocaine-prilocaine (EMLA) cream Apply to the Port-A-Cath site 30-60 minutes before treatment 30 g 0  . LORazepam (ATIVAN) 1 MG tablet Take 1 tablet (1 mg total) by mouth as needed for anxiety (30 minutes prior to radiation treatment and may repeat just prior to procedure if needed.). 10 tablet 0  . magnesium oxide (MAG-OX) 400 MG tablet Take 1 tablet (400 mg total) by mouth daily. 30 tablet 1  . Mobocertinib Succinate (EXKIVITY) 40 MG CAPS Take 160 mg by mouth every morning. 120 capsule 3  . Multiple Vitamin (MULTIVITAMIN WITH MINERALS) TABS tablet Take 1 tablet by mouth daily.    Marland Kitchen omeprazole (PRILOSEC OTC) 20 MG tablet Take 20 mg by mouth daily.    . prochlorperazine (COMPAZINE) 10 MG tablet Take 1 tablet (10 mg total) by mouth every 6 (six) hours as needed for nausea or vomiting. 30 tablet 1  . sildenafil (REVATIO) 20 MG tablet TAKE AS DIRECTED 10 tablet 11  . tamsulosin (FLOMAX) 0.4 MG CAPS capsule Take 0.4 mg by mouth daily.    . memantine (NAMENDA) 10 MG tablet Take 1 tablet (10 mg total) by mouth as directed. 5mg  qd x 1 week, then 5mg  BID x 1 week, then 10mg  am and 5mg  pm x 1 week and then 10mg  BID thereafter. 36 tablet 0  . [START ON 01/13/2021] memantine (NAMENDA) 10 MG tablet Take 1 tablet (10 mg total) by mouth 2 (two) times daily. 60 tablet 4  . sulfamethoxazole-trimethoprim (BACTRIM DS) 800-160 MG tablet Take 1 tablet  by mouth 2 (two) times daily. (Patient not taking: No sig reported) 14 tablet 0   No current facility-administered medications for this encounter.    Physical Findings: Unable to assess due to telephone follow-up visit format.  Lab Findings: Lab Results  Component Value Date   WBC 4.8 11/22/2020   HGB 12.8 (L) 11/22/2020   HCT 39.0 11/22/2020   MCV 90.1 11/22/2020   PLT 162 11/22/2020     Radiographic Findings: CT Chest W Contrast  Result Date: 11/22/2020 CLINICAL DATA:  Lung cancer, chemotherapy in progress. EXAM: CT CHEST, ABDOMEN, AND PELVIS WITH CONTRAST TECHNIQUE: Multidetector CT imaging of the chest, abdomen and pelvis was performed following the standard protocol during bolus administration of intravenous contrast. CONTRAST:  148mL OMNIPAQUE IOHEXOL 300 MG/ML  SOLN COMPARISON:  08/22/2020. FINDINGS: CT CHEST FINDINGS Cardiovascular: Right IJ Port-A-Cath terminates in the SVC. Heart size normal. No pericardial effusion. Mediastinum/Nodes: Mediastinal lymph nodes measure up to 10 mm in the low right paratracheal station, similar. No  hilar or axillary adenopathy. Esophagus is grossly unremarkable. Lungs/Pleura: Mild scarring in the apices. Nodularity and thickening along the minor fissure and right major fissure, similar. Dominant spiculated nodule in the juxta mediastinal medial segment right middle lobe measures 1.2 x 1.7 cm (6/91), slightly decreased from 2.1 x 2.1 cm on 08/22/2020. 9 mm nodule in the lateral segment right middle lobe (6/102) has enlarged from 6 mm on 08/22/2020. Similarly, additional nodularity in the inferior right middle lobe has enlarged, now measuring 0.9 x 2.1 cm (6/123), compared to 0.7 x 1.5 cm on 08/22/2020. Previously seen patchy ground-glass in the right lower lobe has largely resolved. Extensive septal thickening in the right middle and right lower lobes. Compressive atelectasis in the right lower lobe. Moderate complex right pleural effusion with associated  pleural thickening, similar. 3 mm subpleural anterior left upper lobe nodule (6/83), unchanged. Trace residual left pleural effusion, decreased. Airway is unremarkable. Musculoskeletal: Scattered sclerotic lesions are stable. CT ABDOMEN PELVIS FINDINGS Hepatobiliary: Liver is enlarged, 20.2 cm. Liver and gallbladder are otherwise unremarkable. Pancreas: Negative. Spleen: Negative. Adrenals/Urinary Tract: Adrenal glands are unremarkable. Subcentimeter low-attenuation lesion in the right kidney is too small to characterize. Kidneys are otherwise unremarkable. Ureters are decompressed. Tiny calcifications are seen along the dependent aspect of the bladder. Slight irregular bladder wall thickening. There are few scattered diverticula. Prostate is enlarged and indents the bladder. Stomach/Bowel: Stomach and small bowel are unremarkable. Appendix is not readily visualized. Cecum is in the right upper quadrant. Fair amount of stool in the colon is indicative of constipation. Vascular/Lymphatic: Vascular structures are unremarkable. No pathologically enlarged lymph nodes. Reproductive: Prostate is enlarged and indents the bladder. Other: No free fluid. Mild mesenteric haziness and nodularity, unchanged. Musculoskeletal: Scattered sclerotic lesions are unchanged. Minimal retrolisthesis of L5 on S1. IMPRESSION: 1. Although the dominant juxta mediastinal right middle lobe nodule has decreased slightly in size, pleural and parenchymal nodularity in the right hemithorax has enlarged slightly. 2. Septal thickening in the right middle and right lower lobes, as before, worrisome for lymphangitic carcinomatosis. 3. Stable osseous metastatic disease. 4. Chronic appearing moderate right pleural fluid collection. 5. Trace residual left pleural effusion. 6. Hepatomegaly. 7. Bladder wall thickening and diverticula, likely related to an enlarged prostate. Associated tiny bladder stones. Electronically Signed   By: Lorin Picket M.D.    On: 11/22/2020 09:16   MR Brain W Wo Contrast  Result Date: 12/06/2020 CLINICAL DATA:  Metastatic lung cancer. Assess response to treatment EXAM: MRI HEAD WITHOUT AND WITH CONTRAST TECHNIQUE: Multiplanar, multiecho pulse sequences of the brain and surrounding structures were obtained without and with intravenous contrast. CONTRAST:  9mL MULTIHANCE GADOBENATE DIMEGLUMINE 529 MG/ML IV SOLN COMPARISON:  MRI head 10/04/2020, 06/20/2020 FINDINGS: Brain: Interval progression of metastatic disease to brain. Numerous small enhancing lesions are present many of which are new. There are multiple small enhancing lesion in the cerebellum bilaterally which are new. Small lesion in the right paracentral pons is new. 2 mm enhancing lesion posterior limb internal capsule on the right is unchanged. Lesions in the thalamus bilaterally have progressed. Multiple small cortical lesions in both cerebral hemispheres have progressed. Enhancing metastatic deposits are marked with an arrow on axial postcontrast images. 5 mm enhancing lesion left medial frontal lobe has progressed in the interval compatible metastatic disease with increase in surrounding FLAIR hyperintensity. Ventricle size normal. No midline shift. No acute infarct. Chronic areas of microhemorrhage in the right temporal lobe and parietal lobe bilaterally unchanged. Vascular: Normal arterial flow voids. Skull and  upper cervical spine: No focal skeletal lesion. Sinuses/Orbits: Mild mucosal edema paranasal sinuses. Bilateral cataract extraction Other: None IMPRESSION: Interval progression of metastatic disease brain. Numerous small enhancing lesions are present in the cerebellum bilaterally, which are new. Multiple new lesions are present both cerebral hemispheres. Progression of size of the left medial frontal lobe lesion now 5 mm compatible with metastatic disease. Electronically Signed   By: Franchot Gallo M.D.   On: 12/06/2020 09:12   CT Abdomen Pelvis W  Contrast  Result Date: 11/22/2020 CLINICAL DATA:  Lung cancer, chemotherapy in progress. EXAM: CT CHEST, ABDOMEN, AND PELVIS WITH CONTRAST TECHNIQUE: Multidetector CT imaging of the chest, abdomen and pelvis was performed following the standard protocol during bolus administration of intravenous contrast. CONTRAST:  168mL OMNIPAQUE IOHEXOL 300 MG/ML  SOLN COMPARISON:  08/22/2020. FINDINGS: CT CHEST FINDINGS Cardiovascular: Right IJ Port-A-Cath terminates in the SVC. Heart size normal. No pericardial effusion. Mediastinum/Nodes: Mediastinal lymph nodes measure up to 10 mm in the low right paratracheal station, similar. No hilar or axillary adenopathy. Esophagus is grossly unremarkable. Lungs/Pleura: Mild scarring in the apices. Nodularity and thickening along the minor fissure and right major fissure, similar. Dominant spiculated nodule in the juxta mediastinal medial segment right middle lobe measures 1.2 x 1.7 cm (6/91), slightly decreased from 2.1 x 2.1 cm on 08/22/2020. 9 mm nodule in the lateral segment right middle lobe (6/102) has enlarged from 6 mm on 08/22/2020. Similarly, additional nodularity in the inferior right middle lobe has enlarged, now measuring 0.9 x 2.1 cm (6/123), compared to 0.7 x 1.5 cm on 08/22/2020. Previously seen patchy ground-glass in the right lower lobe has largely resolved. Extensive septal thickening in the right middle and right lower lobes. Compressive atelectasis in the right lower lobe. Moderate complex right pleural effusion with associated pleural thickening, similar. 3 mm subpleural anterior left upper lobe nodule (6/83), unchanged. Trace residual left pleural effusion, decreased. Airway is unremarkable. Musculoskeletal: Scattered sclerotic lesions are stable. CT ABDOMEN PELVIS FINDINGS Hepatobiliary: Liver is enlarged, 20.2 cm. Liver and gallbladder are otherwise unremarkable. Pancreas: Negative. Spleen: Negative. Adrenals/Urinary Tract: Adrenal glands are unremarkable.  Subcentimeter low-attenuation lesion in the right kidney is too small to characterize. Kidneys are otherwise unremarkable. Ureters are decompressed. Tiny calcifications are seen along the dependent aspect of the bladder. Slight irregular bladder wall thickening. There are few scattered diverticula. Prostate is enlarged and indents the bladder. Stomach/Bowel: Stomach and small bowel are unremarkable. Appendix is not readily visualized. Cecum is in the right upper quadrant. Fair amount of stool in the colon is indicative of constipation. Vascular/Lymphatic: Vascular structures are unremarkable. No pathologically enlarged lymph nodes. Reproductive: Prostate is enlarged and indents the bladder. Other: No free fluid. Mild mesenteric haziness and nodularity, unchanged. Musculoskeletal: Scattered sclerotic lesions are unchanged. Minimal retrolisthesis of L5 on S1. IMPRESSION: 1. Although the dominant juxta mediastinal right middle lobe nodule has decreased slightly in size, pleural and parenchymal nodularity in the right hemithorax has enlarged slightly. 2. Septal thickening in the right middle and right lower lobes, as before, worrisome for lymphangitic carcinomatosis. 3. Stable osseous metastatic disease. 4. Chronic appearing moderate right pleural fluid collection. 5. Trace residual left pleural effusion. 6. Hepatomegaly. 7. Bladder wall thickening and diverticula, likely related to an enlarged prostate. Associated tiny bladder stones. Electronically Signed   By: Lorin Picket M.D.   On: 11/22/2020 09:16    Impression/Plan: 3. 70 yo man with brain metastases from Stage IV (T2b, N2, M1c) adenocarcinoma of the right middle lobe of the  lung.    We discussed the results of his most recent MRI brain scan from 12/05/2020 which shows interval progression of metastatic disease in the brain with numerous new, small enhancing lesions present in the cerebellum bilaterally as well as multiple new lesions present in both  cerebral hemispheres and increased size of the left medial frontal lobe lesion that we were monitoring, now measuring 5 mm, compatible with metastatic disease.  This was reviewed in our recent multidisciplinary brain clinic and the recommendation is to proceed with a 2-week course of daily whole brain radiotherapy with hippocampal sparing. We discussed the available radiation techniques, and focused on the details and logistics of delivery. We reviewed the anticipated acute and late sequelae associated with radiation in this setting.  We also discussed the use of Namenda to prevent cognitive compromise associated with whole brain radiotherapy and detailed its use and profile.  The patient was encouraged to ask questions that were answered to his satisfaction.   At the conclusion of our discussion, he is in agreement to proceed with the recommended course of whole brain radiotherapy with hippocampal sparing and is in favor of using Namenda to help prevent cognitive compromise.  He is tentatively scheduled for CT simulation on 12/12/2020 in anticipation of beginning his daily treatments in the near future.  A prescription for the Namenda has been sent to his pharmacy and he will pick this up to begin taking concurrent with his radiotherapy and continue for a total of 6 months.  He is comfortable and in agreement with the stated plan.  Therefore, we will share our discussion with Dr. Earlie Server and move forward with treatment planning accordingly. He knows to call at anytime in the interim with any questions or concerns.       Nicholos Johns, PA-C

## 2020-12-19 ENCOUNTER — Ambulatory Visit
Admission: RE | Admit: 2020-12-19 | Discharge: 2020-12-19 | Disposition: A | Discharge status: Home / Self Care | Payer: Medicare Other | Source: Ambulatory Visit | Attending: Radiation Oncology | Admitting: Radiation Oncology

## 2020-12-19 DIAGNOSIS — C342 Malignant neoplasm of middle lobe, bronchus or lung: Secondary | ICD-10-CM | POA: Diagnosis not present

## 2020-12-19 DIAGNOSIS — Z510 Encounter for antineoplastic radiation therapy: Secondary | ICD-10-CM | POA: Diagnosis not present

## 2020-12-19 DIAGNOSIS — C7931 Secondary malignant neoplasm of brain: Secondary | ICD-10-CM | POA: Diagnosis not present

## 2020-12-20 ENCOUNTER — Other Ambulatory Visit: Payer: Self-pay

## 2020-12-20 ENCOUNTER — Ambulatory Visit
Admission: RE | Admit: 2020-12-20 | Discharge: 2020-12-20 | Disposition: A | Discharge status: Home / Self Care | Payer: Medicare Other | Source: Ambulatory Visit | Attending: Radiation Oncology | Admitting: Radiation Oncology

## 2020-12-20 DIAGNOSIS — C342 Malignant neoplasm of middle lobe, bronchus or lung: Secondary | ICD-10-CM | POA: Diagnosis not present

## 2020-12-20 DIAGNOSIS — C7931 Secondary malignant neoplasm of brain: Secondary | ICD-10-CM | POA: Diagnosis not present

## 2020-12-20 DIAGNOSIS — Z510 Encounter for antineoplastic radiation therapy: Secondary | ICD-10-CM | POA: Diagnosis not present

## 2020-12-23 ENCOUNTER — Ambulatory Visit
Admission: RE | Admit: 2020-12-23 | Discharge: 2020-12-23 | Disposition: A | Discharge status: Home / Self Care | Payer: Medicare Other | Source: Ambulatory Visit | Attending: Radiation Oncology | Admitting: Radiation Oncology

## 2020-12-23 ENCOUNTER — Other Ambulatory Visit: Payer: Self-pay

## 2020-12-23 DIAGNOSIS — C7931 Secondary malignant neoplasm of brain: Secondary | ICD-10-CM | POA: Insufficient documentation

## 2020-12-23 DIAGNOSIS — H04123 Dry eye syndrome of bilateral lacrimal glands: Secondary | ICD-10-CM | POA: Diagnosis not present

## 2020-12-23 DIAGNOSIS — H16223 Keratoconjunctivitis sicca, not specified as Sjogren's, bilateral: Secondary | ICD-10-CM | POA: Diagnosis not present

## 2020-12-23 DIAGNOSIS — Z510 Encounter for antineoplastic radiation therapy: Secondary | ICD-10-CM | POA: Insufficient documentation

## 2020-12-23 DIAGNOSIS — H53143 Visual discomfort, bilateral: Secondary | ICD-10-CM | POA: Diagnosis not present

## 2020-12-23 DIAGNOSIS — C342 Malignant neoplasm of middle lobe, bronchus or lung: Secondary | ICD-10-CM | POA: Diagnosis not present

## 2020-12-23 DIAGNOSIS — L719 Rosacea, unspecified: Secondary | ICD-10-CM | POA: Diagnosis not present

## 2020-12-24 ENCOUNTER — Ambulatory Visit
Admission: RE | Admit: 2020-12-24 | Discharge: 2020-12-24 | Disposition: A | Discharge status: Home / Self Care | Payer: Medicare Other | Source: Ambulatory Visit | Attending: Radiation Oncology | Admitting: Radiation Oncology

## 2020-12-24 DIAGNOSIS — C7931 Secondary malignant neoplasm of brain: Secondary | ICD-10-CM | POA: Diagnosis not present

## 2020-12-24 DIAGNOSIS — C342 Malignant neoplasm of middle lobe, bronchus or lung: Secondary | ICD-10-CM | POA: Diagnosis not present

## 2020-12-24 DIAGNOSIS — Z510 Encounter for antineoplastic radiation therapy: Secondary | ICD-10-CM | POA: Diagnosis not present

## 2020-12-25 ENCOUNTER — Inpatient Hospital Stay: Payer: Medicare Other | Attending: Internal Medicine | Admitting: Internal Medicine

## 2020-12-25 ENCOUNTER — Telehealth: Payer: Self-pay | Admitting: Pharmacist

## 2020-12-25 ENCOUNTER — Inpatient Hospital Stay: Payer: Medicare Other

## 2020-12-25 ENCOUNTER — Ambulatory Visit
Admission: RE | Admit: 2020-12-25 | Discharge: 2020-12-25 | Disposition: A | Discharge status: Home / Self Care | Payer: Medicare Other | Source: Ambulatory Visit | Attending: Radiation Oncology | Admitting: Radiation Oncology

## 2020-12-25 ENCOUNTER — Other Ambulatory Visit: Payer: Self-pay

## 2020-12-25 VITALS — BP 136/82 | HR 84 | Temp 99.1°F | Resp 19 | Ht 71.0 in | Wt 190.1 lb

## 2020-12-25 DIAGNOSIS — C3491 Malignant neoplasm of unspecified part of right bronchus or lung: Secondary | ICD-10-CM

## 2020-12-25 DIAGNOSIS — J910 Malignant pleural effusion: Secondary | ICD-10-CM | POA: Insufficient documentation

## 2020-12-25 DIAGNOSIS — H04129 Dry eye syndrome of unspecified lacrimal gland: Secondary | ICD-10-CM

## 2020-12-25 DIAGNOSIS — C3490 Malignant neoplasm of unspecified part of unspecified bronchus or lung: Secondary | ICD-10-CM | POA: Diagnosis not present

## 2020-12-25 DIAGNOSIS — C342 Malignant neoplasm of middle lobe, bronchus or lung: Secondary | ICD-10-CM | POA: Insufficient documentation

## 2020-12-25 DIAGNOSIS — C7989 Secondary malignant neoplasm of other specified sites: Secondary | ICD-10-CM | POA: Insufficient documentation

## 2020-12-25 DIAGNOSIS — C7931 Secondary malignant neoplasm of brain: Secondary | ICD-10-CM | POA: Insufficient documentation

## 2020-12-25 DIAGNOSIS — Z510 Encounter for antineoplastic radiation therapy: Secondary | ICD-10-CM | POA: Diagnosis not present

## 2020-12-25 DIAGNOSIS — Z5111 Encounter for antineoplastic chemotherapy: Secondary | ICD-10-CM

## 2020-12-25 DIAGNOSIS — C787 Secondary malignant neoplasm of liver and intrahepatic bile duct: Secondary | ICD-10-CM | POA: Insufficient documentation

## 2020-12-25 DIAGNOSIS — Z79899 Other long term (current) drug therapy: Secondary | ICD-10-CM | POA: Insufficient documentation

## 2020-12-25 DIAGNOSIS — R059 Cough, unspecified: Secondary | ICD-10-CM

## 2020-12-25 DIAGNOSIS — C7951 Secondary malignant neoplasm of bone: Secondary | ICD-10-CM | POA: Insufficient documentation

## 2020-12-25 LAB — CBC WITH DIFFERENTIAL (CANCER CENTER ONLY)
Abs Immature Granulocytes: 0.01 10*3/uL (ref 0.00–0.07)
Basophils Absolute: 0 10*3/uL (ref 0.0–0.1)
Basophils Relative: 0 %
Eosinophils Absolute: 0.1 10*3/uL (ref 0.0–0.5)
Eosinophils Relative: 2 %
HCT: 38.3 % — ABNORMAL LOW (ref 39.0–52.0)
Hemoglobin: 12.6 g/dL — ABNORMAL LOW (ref 13.0–17.0)
Immature Granulocytes: 0 %
Lymphocytes Relative: 10 %
Lymphs Abs: 0.5 10*3/uL — ABNORMAL LOW (ref 0.7–4.0)
MCH: 30.5 pg (ref 26.0–34.0)
MCHC: 32.9 g/dL (ref 30.0–36.0)
MCV: 92.7 fL (ref 80.0–100.0)
Monocytes Absolute: 0.3 10*3/uL (ref 0.1–1.0)
Monocytes Relative: 5 %
Neutro Abs: 4.3 10*3/uL (ref 1.7–7.7)
Neutrophils Relative %: 83 %
Platelet Count: 163 10*3/uL (ref 150–400)
RBC: 4.13 MIL/uL — ABNORMAL LOW (ref 4.22–5.81)
RDW: 14 % (ref 11.5–15.5)
WBC Count: 5.2 10*3/uL (ref 4.0–10.5)
nRBC: 0 % (ref 0.0–0.2)

## 2020-12-25 MED ORDER — OSIMERTINIB MESYLATE 80 MG PO TABS
80.0000 mg | ORAL_TABLET | Freq: Every day | ORAL | 2 refills | Status: DC
  Filled 2020-12-25: qty 30, 30d supply, fill #0

## 2020-12-25 NOTE — Telephone Encounter (Addendum)
Oral Oncology Pharmacist Encounter  Received new prescription for Tagrisso (osimertinib) for the treatment of metastatic, EGFR mutation-positive (exon 20 insertion) NSCLC, planned duration until disease progression or unacceptable drug toxicity.  Prescription dose and frequency assessed for appropriateness. Appropriate for therapy initiation.   CBC w/ Diff from 12/25/20 and CMP from 11/22/20 assessed, noted Scr trended up to 1.45 mg/dL (CrCl ~56 mL/min) - no renal dose adjustments required. Last EKG in chart from 08/26/20 (QTc 465 ms) - recommend repeat EKG to serve as baseline prior to starting Tagrisso.  Current medication list in Epic reviewed, no relevant/significant DDIs with Tagrisso identified.  Evaluated chart and no patient barriers to medication adherence noted.   Patient agreement for treatment documented in MD note on 12/25/20.  Prescription has been e-scribed to the [facility] for benefits analysis and approval.  Oral Oncology Clinic will continue to follow for insurance authorization, copayment issues, initial counseling and start date.  Leron Croak, PharmD, BCPS Hematology/Oncology Clinical Pharmacist [facility] [phone] 12/25/2020 4:39 PM

## 2020-12-25 NOTE — Telephone Encounter (Addendum)
Oral Chemotherapy Pharmacist Encounter  I spoke with patient for overview of: Tagrisso for the treatment of metastatic, EGFR mutation-positive (exon 20 insertion) NSCLC, planned duration until disease progression or unacceptable toxicity.   Counseled patient on administration, dosing, side effects, monitoring, drug-food interactions, safe handling, storage, and disposal.  Patient will take Tagrisso 80 tablets, 1 tablet by mouth once daily, without regard to food.  Tagrisso start date: pending medication shipment from [employer] patient assistance program  Adverse effects include but are not limited to: diarrhea, mouth sores, decreased appetitie, fatigue, dry skin, rash, nail changes, altered cardiac conduction, and decreased blood counts or electrolytes.  Patient will obtain anti diarrheal and alert the office of 4 or more loose stools above baseline.   Reviewed with patient importance of keeping a medication schedule and plan for any missed doses. No barriers to medication adherence identified.  Medication reconciliation performed and medication/allergy list updated.  Patient approved to receive Tagrisso at no cost through [employer] patient assistance program. As of 12/31/20, medication will be shipped to patient's home in 7-10 business days.  All questions answered.  Ethan Brown voiced understanding and appreciation.   Medication education handout given to patient. Patient knows to call the office with questions or concerns. Oral Chemotherapy Clinic phone number provided to patient.   Leron Croak, PharmD, BCPS Hematology/Oncology Clinical Pharmacist [facility] [phone] 12/25/2020 4:41 PM

## 2020-12-25 NOTE — Progress Notes (Signed)
[facility] [phone]   [phone]  OFFICE PROGRESS NOTE  Laurey Morale, MD [place] [address]  DIAGNOSIS: Stage IV (T2b, N2, M1c) presented with right middle lobe lung mass in addition to mediastinal lymphadenopathy as well as malignant right pleural effusion with pleural-based nodules and suspicious right hepatic lesion and the brain metastasis diagnosed in July 2020.  Molecular Biomarkers: [id number] (Exon 20 insertion) 0.2% Dacomitinib,Neratinib,Osimertinib  [id number] 0.1% None   PRIOR THERAPY:  1) Systemic chemotherapy with carboplatin for AUC of 5, Alimta 500 mg/M2 and Keytruda 200 mg IV every 3 weeks.  First dose April 04, 2019.  Status post 21 cycles  Starting from cycle #5 the patient is on maintenance treatment with Alimta and Keytruda every 3 weeks.  Starting from cycle #19 he will be on single agent Alimta.  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 2) whole brain irradiation for multiple metastatic brain lesions. 3)  Mobocertinib (Axkibity) 327 mg p.o. daily.  First dose was June 29, 2020.  Status post 6 months of treatment.  This was discontinued secondary to disease progression in the brain as well as the chest.  CURRENT THERAPY: Targeted therapy with Tagrisso 80 mg p.o. daily.  Expected to start in the next few days.   INTERVAL HISTORY: Ethan Brown 70 y.o. male returns to the clinic today for follow-up visit.  The patient is currently undergoing whole brain irradiation for multiple metastatic brain lesions.  He is feeling much better with less cough and shortness of breath.  He denied having any chest pain or hemoptysis.  He denied having any fever or chills.  He has no nausea, vomiting, diarrhea or constipation.  He lost few pounds since his last visit.  He was found on repeat MRI of the brain to have innumerable brain metastases and the patient  is started whole brain irradiation more than a week ago.  He was seen by ophthalmology and ENT for the dryness of his eye and nose.  He is feeling much better with the nasal and eyedrops.  He has been tolerating this treatment well except for the mild alopecia.  He is here today for evaluation and discussion of his treatment options.   MEDICAL HISTORY: Past Medical History:  Diagnosis Date  . Borderline hypertension   . Brain metastasis ([place]) dx'd 01/2019  . Detached vitreous humor, left   . Dyspnea   . ED (erectile dysfunction) of organic origin   . Hyperlipidemia   . Hypertension   . Lens subluxation, left   . Lung cancer ([place]) dx'd 01/2019   Stage IV  . Migraine headache    takes PRN Imitrex  . Pseudophakia   . TGA (transient global amnesia) 2017    ALLERGIES:  has No Known Allergies.  MEDICATIONS:  Current Outpatient Medications  Medication Sig Dispense Refill  . cyanocobalamin (,VITAMIN B-12,) 1000 MCG/ML injection INJECT 1 ML (1,000 MCG TOTAL) INTO THE MUSCLE ONCE FOR 1 DOSE. 5 mL 1  . hydrochlorothiazide (MICROZIDE) 12.5 MG capsule Take 1 capsule (12.5 mg total) by mouth 2 (two) times daily. 180 capsule 2  . hydrocortisone 1 % lotion Apply 1 application topically 2 (two) times daily. 113 g 0  . lidocaine-prilocaine (EMLA) cream Apply to the Port-A-Cath site 30-60 minutes before treatment 30 g 0  . LORazepam (ATIVAN) 1 MG tablet Take 1 tablet (1 mg total) by mouth as needed for anxiety (30 minutes prior  to radiation treatment and may repeat just prior to procedure if needed.). 10 tablet 0  . magnesium oxide (MAG-OX) 400 MG tablet Take 1 tablet (400 mg total) by mouth daily. 30 tablet 1  . memantine (NAMENDA) 10 MG tablet Take 1 tablet (10 mg total) by mouth as directed. $RemoveBefo'5mg'aZeIMVRdjVg$  qd x 1 week, then $RemoveBe'5mg'eTHFLnYaL$  BID x 1 week, then $RemoveBe'10mg'vOxyewKlr$  am and $Remo'5mg'ylTvm$  pm x 1 week and then $Remove'10mg'JuzetHR$  BID thereafter. 36 tablet 0  . [START ON 01/13/2021] memantine (NAMENDA) 10 MG tablet Take 1 tablet (10 mg total) by mouth 2  (two) times daily. 60 tablet 4  . Mobocertinib Succinate (EXKIVITY) 40 MG CAPS Take 160 mg by mouth every morning. 120 capsule 3  . Multiple Vitamin (MULTIVITAMIN WITH MINERALS) TABS tablet Take 1 tablet by mouth daily.    Marland Kitchen omeprazole (PRILOSEC OTC) 20 MG tablet Take 20 mg by mouth daily.    . prochlorperazine (COMPAZINE) 10 MG tablet Take 1 tablet (10 mg total) by mouth every 6 (six) hours as needed for nausea or vomiting. 30 tablet 1  . sildenafil (REVATIO) 20 MG tablet TAKE AS DIRECTED 10 tablet 11  . sulfamethoxazole-trimethoprim (BACTRIM DS) 800-160 MG tablet Take 1 tablet by mouth 2 (two) times daily. (Patient not taking: No sig reported) 14 tablet 0  . tamsulosin (FLOMAX) 0.4 MG CAPS capsule Take 0.4 mg by mouth daily.     No current facility-administered medications for this visit.    SURGICAL HISTORY:  Past Surgical History:  Procedure Laterality Date  . CAROTID DOPPLERS  09/2017   Mild non-obstructive disease  . CATARACT EXTRACTION, BILATERAL    . cataract, left  2018  . CHEST TUBE INSERTION Right 03/29/2019   Procedure: INSERTION PLEURAL DRAINAGE CATHETER;  Surgeon: [name], MD;  Location: [facility];  Service: Thoracic;  Laterality: Right;  . COLONOSCOPY  2016   clear, repeat in 10 yrs   . Eye surgeries     , Lens attachment.  Vitrectomy  . FEMORAL HERNIA REPAIR    . HERNIA REPAIR    . IR THORACENTESIS ASP PLEURAL SPACE W/IMG GUIDE  02/22/2019  . IR THORACENTESIS ASP PLEURAL SPACE W/IMG GUIDE  03/13/2019  . IR THORACENTESIS ASP PLEURAL SPACE W/IMG GUIDE  03/18/2020  . LUMBAR LAMINECTOMY    . PLEURAL BIOPSY Right 03/29/2019   Procedure: PLEURAL BIOPSY;  Surgeon: [name], MD;  Location: [facility];  Service: Thoracic;  Laterality: Right;  . PORTACATH PLACEMENT N/A 03/29/2019   Procedure: INSERTION PORT-A-CATH;  Surgeon: [name], MD;  Location: [facility];  Service: Thoracic;  Laterality: N/A;  . retinal attachment, right    . sclearl buckle, right    .  TRANSTHORACIC ECHOCARDIOGRAM  12/14/2018   EF 50 EF 55% (low normal).  Abnormal septal wall motion related to BBB; indeterminate diastolic parameters.  Mild RV enlargement with mildly reduced function.  Normal valves.  Normal atrial sizes.  . victrectomy,right    . VIDEO ASSISTED THORACOSCOPY Right 03/29/2019   Procedure: VIDEO ASSISTED THORACOSCOPY;  Surgeon: [name], MD;  Location: [facility];  Service: Thoracic;  Laterality: Right;    REVIEW OF SYSTEMS:  Constitutional: positive for fatigue and weight loss Eyes: negative Ears, nose, mouth, throat, and face: negative Respiratory: positive for cough Cardiovascular: negative Gastrointestinal: negative Genitourinary:negative Integument/breast: negative Hematologic/lymphatic: negative Musculoskeletal:negative Neurological: negative Behavioral/Psych: negative Endocrine: negative Allergic/Immunologic: negative   PHYSICAL EXAMINATION: General appearance: alert, cooperative, fatigued and no distress Head: Normocephalic, without obvious abnormality, atraumatic  Neck: no adenopathy, no JVD, supple, symmetrical, trachea midline and thyroid not enlarged, symmetric, no tenderness/mass/nodules Lymph nodes: Cervical, supraclavicular, and axillary nodes normal. Resp: clear to auscultation bilaterally Back: symmetric, no curvature. ROM normal. No CVA tenderness. Cardio: regular rate and rhythm, S1, S2 normal, no murmur, click, rub or gallop GI: soft, non-tender; bowel sounds normal; no masses,  no organomegaly Extremities: extremities normal, atraumatic, no cyanosis or edema Neurologic: Alert and oriented X 3, normal strength and tone. Normal symmetric reflexes. Normal coordination and gait  ECOG PERFORMANCE STATUS: 1 - Symptomatic but completely ambulatory  Blood pressure 136/82, pulse 84, temperature 99.1 F (37.3 C), temperature source Tympanic, resp. rate 19, height $RemoveBe'5\' 11"'xnByRnwvz$  (1.803 m), weight 190 lb 1.6 oz (86.2 kg), SpO2 97  %.  LABORATORY DATA: Lab Results  Component Value Date   WBC 5.2 12/25/2020   HGB 12.6 (L) 12/25/2020   HCT 38.3 (L) 12/25/2020   MCV 92.7 12/25/2020   PLT 163 12/25/2020      Chemistry      Component Value Date/Time   NA 141 11/22/2020 1311   K 3.8 11/22/2020 1311   CL 103 11/22/2020 1311   CO2 27 11/22/2020 1311   BUN 24 (H) 11/22/2020 1311   CREATININE 1.45 (H) 11/22/2020 1311      Component Value Date/Time   CALCIUM 8.7 (L) 11/22/2020 1311   ALKPHOS 82 11/22/2020 1311   AST 22 11/22/2020 1311   ALT 17 11/22/2020 1311   BILITOT 0.4 11/22/2020 1311       RADIOGRAPHIC STUDIES: MR Brain W Wo Contrast  Result Date: 12/06/2020 CLINICAL DATA:  Metastatic lung cancer. Assess response to treatment EXAM: MRI HEAD WITHOUT AND WITH CONTRAST TECHNIQUE: Multiplanar, multiecho pulse sequences of the brain and surrounding structures were obtained without and with intravenous contrast. CONTRAST:  97mL MULTIHANCE GADOBENATE DIMEGLUMINE 529 MG/ML IV SOLN COMPARISON:  MRI head 10/04/2020, 06/20/2020 FINDINGS: Brain: Interval progression of metastatic disease to brain. Numerous small enhancing lesions are present many of which are new. There are multiple small enhancing lesion in the cerebellum bilaterally which are new. Small lesion in the right paracentral pons is new. 2 mm enhancing lesion posterior limb internal capsule on the right is unchanged. Lesions in the thalamus bilaterally have progressed. Multiple small cortical lesions in both cerebral hemispheres have progressed. Enhancing metastatic deposits are marked with an arrow on axial postcontrast images. 5 mm enhancing lesion left medial frontal lobe has progressed in the interval compatible metastatic disease with increase in surrounding FLAIR hyperintensity. Ventricle size normal. No midline shift. No acute infarct. Chronic areas of microhemorrhage in the right temporal lobe and parietal lobe bilaterally unchanged. Vascular: Normal  arterial flow voids. Skull and upper cervical spine: No focal skeletal lesion. Sinuses/Orbits: Mild mucosal edema paranasal sinuses. Bilateral cataract extraction Other: None IMPRESSION: Interval progression of metastatic disease brain. Numerous small enhancing lesions are present in the cerebellum bilaterally, which are new. Multiple new lesions are present both cerebral hemispheres. Progression of size of the left medial frontal lobe lesion now 5 mm compatible with metastatic disease. Electronically Signed   By: Franchot Gallo M.D.   On: 12/06/2020 09:12    ASSESSMENT AND PLAN: This is a very pleasant 70 years old white male recently diagnosed with a stage IV (T2b, N2, M1C) non-small cell lung cancer, adenocarcinoma with positive EGFR mutation in exon 20 (resistant mutation) diagnosed in July 2020 and presented with extensive disease involving the right upper lobe as well as the right middle and  lower lobe with extensive pleural based metastasis as well as mediastinal and hilar disease with malignant pleural effusion as well as liver and brain metastasis. Unfortunately the patient has no actionable mutations based on the molecular studies by [facility] [employer].   The patient is currently undergoing systemic chemotherapy with carboplatin for AUC of 5, Alimta 500 mg/M2 and Keytruda 200 mg IV every 3 weeks status post 21 cycles.  Starting from cycle #5 the patient is on maintenance treatment with Alimta and Keytruda. Beryle Flock was discontinued after cycle #18 secondary to recurrent immunotherapy mediated pneumonitis. Starting from cycle #19 the patient will be on treatment with single agent Alimta every 3 weeks. The patient has been tolerating the treatment well. He had repeat PET scan and MRI of the brain performed recently.  The PET scan showed evidence for disease progression with metastatic disease involving the thoracic and abdominal lymph nodes as well as the right hemithorax and bones.  He also has loculated  moderate right pleural effusion and small left pleural effusion.  The PET scan also showed prostate enlargement and his PSA has been rising recently.  He was seen by urology. MRI of the brain showed development of new 5 mm right temporal lesion suspicious for metastatic disease as well as new 2 mm focus of enhancement in the left periventricular white matter also suspicious for metastatic disease.  He was seen by radiation oncology and expected to have [place] to this lesion soon. The patient is currently on treatment with Mobocertinib (Axkibity) 671 mg p.o. daily.  Status post 6 months of treatment.  He has been tolerating the treatment well with no concerning adverse effects except for the dryness of his nose that is not responding to the over-the-counter medication.   Unfortunately he had significant disease progression in the brain with innumerable brain metastasis in addition to previous increase in some of the pulmonary nodules seen on the previous imaging studies. He is currently undergoing whole brain irradiation. I had a lengthy discussion with the patient today about his current condition and treatment options. I recommended for the patient to discontinue his treatment with Mobocertinib (Axkibity) at this point. I discussed with him several options for management of his condition including treatment with infusional [place] versus systemic chemotherapy versus treatment with oral Tagrisso which also has some activity in exon 20 patient with the ability to close the blood brain barrier and may help with his brain metastasis. After discussion of all the options the patient is interested in an treatment with Tagrisso and he will be seen by the pharmacist for oral oncolytic today. I discussed with him the adverse effect of this treatment including skin rash, diarrhea, interstitial lung disease, liver or renal dysfunction. I will repeat CT scan of the chest, abdomen pelvis early next week for restaging  of his disease before starting this treatment and we will also arrange for the patient to have EKG at that time. For the nasal dryness and congestion he is feeling much better after seeing Dr. Lucia Gaskins and starting nasal drip. He was advised to call immediately if he has any other concerning symptoms in the interval.  The patient voices understanding of current disease status and treatment options and is in agreement with the current care plan. All questions were answered. The patient knows to call the clinic with any problems, questions or concerns. We can certainly see the patient much sooner if necessary.  Disclaimer: This note was dictated with voice recognition software. Similar sounding words can inadvertently  be transcribed and may not be corrected upon review.

## 2020-12-26 ENCOUNTER — Telehealth: Payer: Self-pay

## 2020-12-26 ENCOUNTER — Other Ambulatory Visit (HOSPITAL_COMMUNITY): Payer: Self-pay

## 2020-12-26 ENCOUNTER — Ambulatory Visit
Admission: RE | Admit: 2020-12-26 | Discharge: 2020-12-26 | Disposition: A | Discharge status: Home / Self Care | Payer: Medicare Other | Source: Ambulatory Visit | Attending: Radiation Oncology | Admitting: Radiation Oncology

## 2020-12-26 DIAGNOSIS — C342 Malignant neoplasm of middle lobe, bronchus or lung: Secondary | ICD-10-CM | POA: Diagnosis not present

## 2020-12-26 DIAGNOSIS — C7931 Secondary malignant neoplasm of brain: Secondary | ICD-10-CM | POA: Diagnosis not present

## 2020-12-26 DIAGNOSIS — Z510 Encounter for antineoplastic radiation therapy: Secondary | ICD-10-CM | POA: Diagnosis not present

## 2020-12-26 NOTE — Telephone Encounter (Signed)
Oral Oncology Patient Advocate Encounter  Prior Authorization for Ethan Brown has been approved.    PA# [place] Effective dates: 12/26/20 through 12/26/21  Patients co-pay is $2824  Oral Oncology Clinic will continue to follow.    Ethan Brown Phone [phone] Fax [phone] 12/26/2020 10:48 AM

## 2020-12-26 NOTE — Telephone Encounter (Signed)
Oral Oncology Patient Advocate Encounter  Received notification from [facility] that prior authorization for Tagrisso is required.  PA submitted on CoverMyMeds [place] Status is pending  Oral Oncology Clinic will continue to follow.  [place] Patient [place] Phone [phone] Fax [phone] 12/26/2020 8:49 AM

## 2020-12-27 ENCOUNTER — Ambulatory Visit
Admission: RE | Admit: 2020-12-27 | Discharge: 2020-12-27 | Disposition: A | Discharge status: Home / Self Care | Payer: Medicare Other | Source: Ambulatory Visit | Attending: Radiation Oncology | Admitting: Radiation Oncology

## 2020-12-27 ENCOUNTER — Telehealth: Payer: Self-pay

## 2020-12-27 ENCOUNTER — Other Ambulatory Visit: Payer: Self-pay

## 2020-12-27 DIAGNOSIS — C7931 Secondary malignant neoplasm of brain: Secondary | ICD-10-CM | POA: Diagnosis not present

## 2020-12-27 DIAGNOSIS — C342 Malignant neoplasm of middle lobe, bronchus or lung: Secondary | ICD-10-CM | POA: Diagnosis not present

## 2020-12-27 DIAGNOSIS — C61 Malignant neoplasm of prostate: Secondary | ICD-10-CM | POA: Diagnosis not present

## 2020-12-27 DIAGNOSIS — Z510 Encounter for antineoplastic radiation therapy: Secondary | ICD-10-CM | POA: Diagnosis not present

## 2020-12-27 NOTE — Telephone Encounter (Signed)
Oral Oncology Patient Advocate Encounter  Met patient in [place] to complete application for [place] Program in an effort to reduce the patient's out of pocket expense for Tagrisso to $0.    Application completed and faxed to [phone].   [place] patient assistance phone number for follow up is [phone].   This encounter will be updated until final determination.  [place] Patient Ethan Brown Phone [phone] Fax [phone] 12/27/2020 3:11 PM

## 2020-12-30 ENCOUNTER — Ambulatory Visit
Admission: RE | Admit: 2020-12-30 | Discharge: 2020-12-30 | Disposition: A | Discharge status: Home / Self Care | Payer: Medicare Other | Source: Ambulatory Visit | Attending: Radiation Oncology | Admitting: Radiation Oncology

## 2020-12-30 ENCOUNTER — Telehealth: Payer: Self-pay | Admitting: Physician Assistant

## 2020-12-30 ENCOUNTER — Other Ambulatory Visit: Payer: Self-pay

## 2020-12-30 DIAGNOSIS — C342 Malignant neoplasm of middle lobe, bronchus or lung: Secondary | ICD-10-CM | POA: Diagnosis not present

## 2020-12-30 DIAGNOSIS — C7931 Secondary malignant neoplasm of brain: Secondary | ICD-10-CM | POA: Diagnosis not present

## 2020-12-30 DIAGNOSIS — Z510 Encounter for antineoplastic radiation therapy: Secondary | ICD-10-CM | POA: Diagnosis not present

## 2020-12-30 NOTE — Telephone Encounter (Signed)
R/s appt per 5/9 sch msg. Pt aware.  

## 2020-12-31 ENCOUNTER — Other Ambulatory Visit: Payer: Self-pay | Admitting: Medical

## 2020-12-31 ENCOUNTER — Ambulatory Visit
Admission: RE | Admit: 2020-12-31 | Discharge: 2020-12-31 | Disposition: A | Discharge status: Home / Self Care | Payer: Medicare Other | Source: Ambulatory Visit | Attending: Radiation Oncology | Admitting: Radiation Oncology

## 2020-12-31 ENCOUNTER — Encounter: Payer: Self-pay | Admitting: Urology

## 2020-12-31 DIAGNOSIS — Z510 Encounter for antineoplastic radiation therapy: Secondary | ICD-10-CM | POA: Diagnosis not present

## 2020-12-31 DIAGNOSIS — C7931 Secondary malignant neoplasm of brain: Secondary | ICD-10-CM | POA: Diagnosis not present

## 2020-12-31 DIAGNOSIS — C342 Malignant neoplasm of middle lobe, bronchus or lung: Secondary | ICD-10-CM | POA: Diagnosis not present

## 2020-12-31 MED ORDER — MAGIC MOUTHWASH: 5.0000 mL | Freq: Four times a day (QID) | ORAL | 0 refills | Status: DC | PRN

## 2020-12-31 MED ORDER — LIDOCAINE VISCOUS HCL 2 % MT SOLN: 5.0000 mL | OROMUCOSAL | 2 refills | Status: DC | PRN

## 2020-12-31 NOTE — Telephone Encounter (Signed)
Patient is approved for Tagrisso at no cost from [facility] and [place] 12/31/20-08/23/21.  [place] uses [facility].  [place] Patient [place] Phone [phone] Fax [phone] 12/31/2020 10:55 AM

## 2021-01-01 NOTE — Progress Notes (Signed)
Received patient in the clinic following final brain radiation treatment. Patient complains of a sore throat. Patient reports using FLONASE to manage seasonal allergies. Patient continues Exkivity as directed. Ulceration noted on the right side of the back of his throat. Redness and edema noted back of throat on both sides. Recognizing this is unrelated to brain radiation this RN contacted Sandi Mealy in symptom management since Dr. Julien Nordmann and Rubin Payor, PA-C are out of the office. Lucianne Lei called in Magic mouthwash and viscous lidocaine to [facility] [place] [facility] [employer]. As directed by Lucianne Lei I instructed the patient to follow up with him on Thursday, when he comes in for lab work, if his throat isn't feeling better. Reviewed what he should expect in the coming weeks relative to his brain radiation. Answered all patient questions to the best of my ability. Patient verbalized understanding of all reviewed.

## 2021-01-02 ENCOUNTER — Inpatient Hospital Stay: Payer: Medicare Other

## 2021-01-02 ENCOUNTER — Other Ambulatory Visit: Payer: Self-pay

## 2021-01-02 ENCOUNTER — Telehealth: Payer: Self-pay | Admitting: Radiation Therapy

## 2021-01-02 ENCOUNTER — Ambulatory Visit (HOSPITAL_COMMUNITY)
Admission: RE | Admit: 2021-01-02 | Discharge: 2021-01-02 | Disposition: A | Discharge status: Home / Self Care | Payer: Medicare Other | Source: Ambulatory Visit | Attending: Internal Medicine | Admitting: Internal Medicine

## 2021-01-02 ENCOUNTER — Encounter (HOSPITAL_COMMUNITY): Payer: Self-pay

## 2021-01-02 DIAGNOSIS — J90 Pleural effusion, not elsewhere classified: Secondary | ICD-10-CM | POA: Diagnosis not present

## 2021-01-02 DIAGNOSIS — N400 Enlarged prostate without lower urinary tract symptoms: Secondary | ICD-10-CM | POA: Diagnosis not present

## 2021-01-02 DIAGNOSIS — C3490 Malignant neoplasm of unspecified part of unspecified bronchus or lung: Secondary | ICD-10-CM

## 2021-01-02 DIAGNOSIS — C787 Secondary malignant neoplasm of liver and intrahepatic bile duct: Secondary | ICD-10-CM | POA: Diagnosis not present

## 2021-01-02 DIAGNOSIS — Z95828 Presence of other vascular implants and grafts: Secondary | ICD-10-CM

## 2021-01-02 DIAGNOSIS — I700 Atherosclerosis of aorta: Secondary | ICD-10-CM | POA: Diagnosis not present

## 2021-01-02 DIAGNOSIS — C342 Malignant neoplasm of middle lobe, bronchus or lung: Secondary | ICD-10-CM | POA: Diagnosis not present

## 2021-01-02 DIAGNOSIS — J910 Malignant pleural effusion: Secondary | ICD-10-CM | POA: Diagnosis not present

## 2021-01-02 DIAGNOSIS — C7931 Secondary malignant neoplasm of brain: Secondary | ICD-10-CM | POA: Diagnosis not present

## 2021-01-02 DIAGNOSIS — Z79899 Other long term (current) drug therapy: Secondary | ICD-10-CM | POA: Diagnosis not present

## 2021-01-02 DIAGNOSIS — C7989 Secondary malignant neoplasm of other specified sites: Secondary | ICD-10-CM | POA: Diagnosis not present

## 2021-01-02 DIAGNOSIS — C7951 Secondary malignant neoplasm of bone: Secondary | ICD-10-CM | POA: Diagnosis not present

## 2021-01-02 DIAGNOSIS — J929 Pleural plaque without asbestos: Secondary | ICD-10-CM | POA: Diagnosis not present

## 2021-01-02 LAB — CMP (CANCER CENTER ONLY)
ALT: 14 U/L (ref 0–44)
AST: 19 U/L (ref 15–41)
Albumin: 3.2 g/dL — ABNORMAL LOW (ref 3.5–5.0)
Alkaline Phosphatase: 100 U/L (ref 38–126)
Anion gap: 8 (ref 5–15)
BUN: 21 mg/dL (ref 8–23)
CO2: 29 mmol/L (ref 22–32)
Calcium: 9.1 mg/dL (ref 8.9–10.3)
Chloride: 102 mmol/L (ref 98–111)
Creatinine: 1.17 mg/dL (ref 0.61–1.24)
GFR, Estimated: >60 mL/min (ref >60)
Glucose, Bld: 90 mg/dL (ref 70–99)
Potassium: 3.9 mmol/L (ref 3.5–5.1)
Sodium: 139 mmol/L (ref 135–145)
Total Bilirubin: 0.3 mg/dL (ref 0.3–1.2)
Total Protein: 6.4 g/dL — ABNORMAL LOW (ref 6.5–8.1)

## 2021-01-02 LAB — CBC WITH DIFFERENTIAL (CANCER CENTER ONLY)
Abs Immature Granulocytes: 0.01 10*3/uL (ref 0.00–0.07)
Basophils Absolute: 0 10*3/uL (ref 0.0–0.1)
Basophils Relative: 1 %
Eosinophils Absolute: 0.1 10*3/uL (ref 0.0–0.5)
Eosinophils Relative: 2 %
HCT: 37.5 % — ABNORMAL LOW (ref 39.0–52.0)
Hemoglobin: 12.6 g/dL — ABNORMAL LOW (ref 13.0–17.0)
Immature Granulocytes: 0 %
Lymphocytes Relative: 12 %
Lymphs Abs: 0.6 10*3/uL — ABNORMAL LOW (ref 0.7–4.0)
MCH: 30.7 pg (ref 26.0–34.0)
MCHC: 33.6 g/dL (ref 30.0–36.0)
MCV: 91.2 fL (ref 80.0–100.0)
Monocytes Absolute: 0.3 10*3/uL (ref 0.1–1.0)
Monocytes Relative: 6 %
Neutro Abs: 3.9 10*3/uL (ref 1.7–7.7)
Neutrophils Relative %: 79 %
Platelet Count: 177 10*3/uL (ref 150–400)
RBC: 4.11 MIL/uL — ABNORMAL LOW (ref 4.22–5.81)
RDW: 13.4 % (ref 11.5–15.5)
WBC Count: 5 10*3/uL (ref 4.0–10.5)
nRBC: 0 % (ref 0.0–0.2)

## 2021-01-02 MED ORDER — HEPARIN SOD (PORK) LOCK FLUSH 100 UNIT/ML IV SOLN
500.0000 [IU] | Freq: Once | INTRAVENOUS | Status: AC
  Administered 2021-01-02: 500 [IU] via INTRAVENOUS

## 2021-01-02 MED ORDER — SODIUM CHLORIDE (PF) 0.9 % IJ SOLN
INTRAMUSCULAR | Status: AC
  Filled 2021-01-02: qty 50

## 2021-01-02 MED ORDER — IOHEXOL 300 MG/ML  SOLN
75.0000 mL | Freq: Once | INTRAMUSCULAR | Status: AC | PRN
  Administered 2021-01-02: 75 mL via INTRAVENOUS

## 2021-01-02 MED ORDER — SODIUM CHLORIDE 0.9% FLUSH
10.0000 mL | INTRAVENOUS | Status: DC | PRN
  Administered 2021-01-02: 10 mL
  Filled 2021-01-02: qty 10

## 2021-01-02 NOTE — Telephone Encounter (Signed)
Ethan Brown was prescribed Namenda to preserve memory during his whole brain radiation course. He called to say he had forgotten to start this medication while he was under treatment and that he would like to wait until after he returns from vacation, if possible. He is concerned about the possible side effects being bothersome while on vacation, but also wants to make sure that there is still an added benefit to take this medication after his treatment has completed. Per [employer], PA-C, yes it is OK to wait until after his vacation and yes he will still have an added benefit from taking this medication after the treatment has completed. [name] was very happy to hear this and will let us know if he has any other questions or concerns. While on the phone I reminded him to be sure and wear a hat to protect his scalp from the sun exposure while [place] the beach.   Mont Dutton R.T.(R)(T) Radiation Special Procedures Navigator

## 2021-01-05 ENCOUNTER — Other Ambulatory Visit: Payer: Self-pay | Admitting: Adult Health

## 2021-01-06 ENCOUNTER — Other Ambulatory Visit: Payer: Self-pay | Admitting: Urology

## 2021-01-07 ENCOUNTER — Inpatient Hospital Stay: Payer: Medicare Other | Admitting: Physician Assistant

## 2021-01-11 NOTE — Progress Notes (Signed)
[facility] OFFICE PROGRESS NOTE  Laurey Morale, MD [place] [address]  DIAGNOSIS: Stage IV (T2b, N2, M1c)presented with right middle lobe lung mass in addition to mediastinal lymphadenopathy as well as malignant right pleural effusion with pleural-based nodules and suspicious right hepatic lesion and the brain metastasis diagnosed in July 2020.  Molecular Biomarkers: [id number] (Exon 20 insertion) 0.2% Dacomitinib,Neratinib,Osimertinib  [id number] 0.1% None  PRIOR THERAPY: 1)SRS treatment to the metastatic disease to the brain under the care of Dr. Tammi Klippel. Completed on 04/07/2019. 2) SRS to the suspicious small brain lesion in the thalamocapsular region 01/23/2020 under the care of Dr. Tammi Klippel 3) Systemic chemotherapy with carboplatin for AUC of 5, Alimta 500 mg/M2 and Keytruda 200 mg IV every 3 weeks.  First dose April 04, 2019.  Status post 21 cycles  Starting from cycle #5 the patient is on maintenance treatment with Alimta and Keytruda every 3 weeks.  Starting from cycle #19 he will be on single agent Alimta.  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 4) SRS to the metastatic brain lesions under the care of Dr. Tammi Klippel on 07/03/20 5) whole brain irradiation for multiple metastatic brain lesions in May 2449 6)  Mobocertinib (Axkibity) 753 mg p.o. daily.  First dose was June 29, 2020.  Status post 6 months of treatment.  This was discontinued secondary to disease progression in the brain as well as the chest in May 2022  CURRENT THERAPY: Targeted therapy with Tagrisso 80 mg p.o. daily. First dose 01/13/21  INTERVAL HISTORY: Ethan Brown 70 y.o. male returns to the clinic today for a follow-up visit.  In early May 2022, the patient was found to have multiple metastatic brain lesions for whichhe completed whole brain radiation under the care of Dr. Tammi Klippel.  Due to the significant disease  progression in the brain, Dr. Julien Nordmann discontinued his targeted treatment with Mobocertinib.  Dr. Julien Nordmann had discussed with him several options for management of his condition including with infusion with Amivantamab versus systemic chemotherapy versus treatment with oral Tagrisso which also has some activity in exon 20 patients with the ability to close the blood brain barrier which may help with his brain metastasis. The patient opted to start on oral targeted treatment with Tagrisso. The patient started this medication yesterday. So far, he has not noticed any side effects.    In the interval since his last appointment, the patient developed an ulcer in his mouth during the course of radiation. He was evaluated in the [facility] and was given a prescription of magic mouthwash. This helped him and he is wondering if he should use this for prophylactics.   Today, the patient denies any recent fever, chills, or night sweats. He lost about 5 lbs but states he has been trying. His cough has worsened over the last few weeks. He denies changes with shortness of breath. He walked about 30-40 minutes a few days ago and denies any significant shortness of breath. He sometimes has chest soreness from coughing. He denies hemoptysis.  Denies any nausea or vomiting.  Denies any constipation. He has diarrhea 1-2x per week with about 1-2 loose/soft stools. He has been seeing an opthalmologist for "light sensitivity" and has been using restasis. He is scheduled for a follow up with them next week. He had some headaches towards the end of radiation but it has improved at this time. The patient recently had a restaging CT scan prior to starting his Tagrisso  to restage his condition.  He is here today for evaluation, repeat blood work, and to review his scan results.   MEDICAL HISTORY: Past Medical History:  Diagnosis Date  . Borderline hypertension   . Brain metastasis ([place]) dx'd 01/2019  . Detached vitreous humor, left   .  Dyspnea   . ED (erectile dysfunction) of organic origin   . Hyperlipidemia   . Hypertension   . Lens subluxation, left   . Lung cancer ([place]) dx'd 01/2019   Stage IV  . Migraine headache    takes PRN Imitrex  . Pseudophakia   . TGA (transient global amnesia) 2017    ALLERGIES:  has No Known Allergies.  MEDICATIONS:  Current Outpatient Medications  Medication Sig Dispense Refill  . cyanocobalamin (,VITAMIN B-12,) 1000 MCG/ML injection INJECT 1 ML (1,000 MCG TOTAL) INTO THE MUSCLE ONCE FOR 1 DOSE. 5 mL 1  . cycloSPORINE (RESTASIS) 0.05 % ophthalmic emulsion Place 1 drop into both eyes 2 (two) times daily.    . hydrochlorothiazide (MICROZIDE) 12.5 MG capsule TAKE 1 CAPSULE BY MOUTH EVERY DAY 90 capsule 3  . HYDROcodone bit-homatropine (HYCODAN) 5-1.5 MG/5ML syrup Take 5 mLs by mouth every 6 (six) hours as needed for cough. 473 mL 0  . hydrocortisone 1 % lotion Apply 1 application topically 2 (two) times daily. 113 g 0  . lidocaine (XYLOCAINE) 2 % solution Use as directed 5 mLs in the mouth or throat every 3 (three) hours as needed for mouth pain. Gargle and spit 200 mL 2  . lidocaine-prilocaine (EMLA) cream Apply to the Port-A-Cath site 30-60 minutes before treatment 30 g 0  . LORazepam (ATIVAN) 1 MG tablet Take 1 tablet (1 mg total) by mouth as needed for anxiety (30 minutes prior to radiation treatment and may repeat just prior to procedure if needed.). 10 tablet 0  . magnesium oxide (MAG-OX) 400 MG tablet Take 1 tablet (400 mg total) by mouth daily. 30 tablet 1  . memantine (NAMENDA) 10 MG tablet Take 1 tablet (10 mg total) by mouth as directed. 97m qd x 1 week, then 520mBID x 1 week, then 1075mm and 5mg93m x 1 week and then 10mg63m thereafter. 36 tablet 0  . memantine (NAMENDA) 10 MG tablet TAKE 1 TABLET BY MOUTH TWICE A DAY 180 tablet 2  . Multiple Vitamin (MULTIVITAMIN WITH MINERALS) TABS tablet Take 1 tablet by mouth daily.    . omeMarland Kitchenrazole (PRILOSEC OTC) 20 MG tablet Take 20 mg by  mouth daily.    . osiMarland Kitchenertinib mesylate (TAGRISSO) 80 MG tablet Take 1 tablet (80 mg total) by mouth daily. 30 tablet 2  . prochlorperazine (COMPAZINE) 10 MG tablet Take 1 tablet (10 mg total) by mouth every 6 (six) hours as needed for nausea or vomiting. 30 tablet 1  . sildenafil (REVATIO) 20 MG tablet TAKE AS DIRECTED 10 tablet 11  . sulfamethoxazole-trimethoprim (BACTRIM DS) 800-160 MG tablet Take 1 tablet by mouth 2 (two) times daily. 14 tablet 0  . tamsulosin (FLOMAX) 0.4 MG CAPS capsule Take 0.4 mg by mouth daily.    . magic mouthwash SOLN Take 5 mLs by mouth 4 (four) times daily as needed for mouth pain. Swish and spit or swallow 240 mL 0   No current facility-administered medications for this visit.    SURGICAL HISTORY:  Past Surgical History:  Procedure Laterality Date  . CAROTID DOPPLERS  09/2017   Mild non-obstructive disease  . CATARACT EXTRACTION, BILATERAL    .  cataract, left  2018  . CHEST TUBE INSERTION Right 03/29/2019   Procedure: INSERTION PLEURAL DRAINAGE CATHETER;  Surgeon: [name], MD;  Location: [facility];  Service: Thoracic;  Laterality: Right;  . COLONOSCOPY  2016   clear, repeat in 10 yrs   . Eye surgeries     , Lens attachment.  Vitrectomy  . FEMORAL HERNIA REPAIR    . HERNIA REPAIR    . IR THORACENTESIS ASP PLEURAL SPACE W/IMG GUIDE  02/22/2019  . IR THORACENTESIS ASP PLEURAL SPACE W/IMG GUIDE  03/13/2019  . IR THORACENTESIS ASP PLEURAL SPACE W/IMG GUIDE  03/18/2020  . LUMBAR LAMINECTOMY    . PLEURAL BIOPSY Right 03/29/2019   Procedure: PLEURAL BIOPSY;  Surgeon: [name], MD;  Location: [facility];  Service: Thoracic;  Laterality: Right;  . PORTACATH PLACEMENT N/A 03/29/2019   Procedure: INSERTION PORT-A-CATH;  Surgeon: [name], MD;  Location: [facility];  Service: Thoracic;  Laterality: N/A;  . retinal attachment, right    . sclearl buckle, right    . TRANSTHORACIC ECHOCARDIOGRAM  12/14/2018   EF 50 EF 55% (low normal).  Abnormal  septal wall motion related to BBB; indeterminate diastolic parameters.  Mild RV enlargement with mildly reduced function.  Normal valves.  Normal atrial sizes.  . victrectomy,right    . VIDEO ASSISTED THORACOSCOPY Right 03/29/2019   Procedure: VIDEO ASSISTED THORACOSCOPY;  Surgeon: [name], MD;  Location: [facility];  Service: Thoracic;  Laterality: Right;    REVIEW OF SYSTEMS:   Review of Systems  Constitutional: Positive for fatigue and weight loss.  Negative for appetite change, chills, or fever. HENT:   Negative for mouth sores (resolved), nosebleeds, sore throat and trouble swallowing.   Eyes: Negative for eye problems and icterus.  Respiratory: Positive for cough.  Negative for hemoptysis, shortness of breath and wheezing.   Cardiovascular: Positive for some chest soreness with coughing. negative for leg swelling.  Gastrointestinal: Positive for occasional diarrhea. Negative for abdominal pain, constipation, nausea and vomiting.  Genitourinary: Negative for bladder incontinence, difficulty urinating, dysuria, frequency and hematuria.   Musculoskeletal: Negative for back pain, gait problem, neck pain and neck stiffness.  Skin: Negative for itching and rash.  Neurological: Negative for dizziness, extremity weakness, gait problem, headaches, light-headedness and seizures.  Hematological: Negative for adenopathy. Does not bruise/bleed easily.  Psychiatric/Behavioral: Negative for confusion, depression and sleep disturbance. The patient is not nervous/anxious.     PHYSICAL EXAMINATION:  Blood pressure 138/79, pulse 87, temperature 98.3 F (36.8 C), temperature source Oral, resp. rate 18, weight 185 lb 8 oz (84.1 kg), SpO2 98 %.  ECOG PERFORMANCE STATUS: 1 - Symptomatic but completely ambulatory  Physical Exam  Constitutional: Oriented to person, place, and time and in no distress.  HENT:  Head: Normocephalic and atraumatic.  Facial flushing and dry skin present.  Eyes:  Conjunctivae are normal. Right eye exhibits no discharge. Left eye exhibits no discharge. No scleral icterus.  Neck: Normal range of motion. Neck supple.  Cardiovascular: Normal rate, regular rhythm, normal heart sounds and intact distal pulses.   Pulmonary/Chest: Effort normal.  Quiet breath sounds in the right lung.  No respiratory distress. No wheezes. No rales.  Abdominal: Soft. Bowel sounds are normal. Exhibits no distension and no mass. There is no tenderness.  Musculoskeletal: Normal range of motion. Exhibits no edema.  Lymphadenopathy:    No cervical adenopathy.  Neurological: Alert and oriented to person, place, and time. Exhibits normal muscle tone. Gait normal. Coordination  normal.  Skin: Skin is warm and dry. No rash noted. Not diaphoretic. No erythema. No pallor.  Psychiatric: Mood, memory and judgment normal.  Vitals reviewed.  LABORATORY DATA: Lab Results  Component Value Date   WBC 5.0 01/02/2021   HGB 12.6 (L) 01/02/2021   HCT 37.5 (L) 01/02/2021   MCV 91.2 01/02/2021   PLT 177 01/02/2021      Chemistry      Component Value Date/Time   NA 139 01/02/2021 1328   K 3.9 01/02/2021 1328   CL 102 01/02/2021 1328   CO2 29 01/02/2021 1328   BUN 21 01/02/2021 1328   CREATININE 1.17 01/02/2021 1328      Component Value Date/Time   CALCIUM 9.1 01/02/2021 1328   ALKPHOS 100 01/02/2021 1328   AST 19 01/02/2021 1328   ALT 14 01/02/2021 1328   BILITOT 0.3 01/02/2021 1328       RADIOGRAPHIC STUDIES:  CT Chest W Contrast  Result Date: 01/03/2021 CLINICAL DATA:  Primary Cancer Type: Lung Imaging Indication: Assess response to therapy Interval therapy since last imaging? Yes Initial Cancer Diagnosis Date: 03/29/2019; Established by: Biopsy-proven Detailed Pathology: Stage IV non-small cell lung cancer, adenocarcinoma. Primary Tumor location: Right middle lobe. Brain and skeletal metastases. Surgeries: Hernia repair. Lumbar laminectomy. Chemotherapy: Yes; Ongoing?  Yes;  Tagrisso daily. Immunotherapy?  Yes; Type: Keytruda; Ongoing? No Radiation therapy? Yes; Date Range: 03/2019, 01/2020, 06/2020; 12/19/2020 - 01/01/2021; Target: SRS to the metastatic brain lesions EXAM: CT CHEST, ABDOMEN, AND PELVIS WITH CONTRAST TECHNIQUE: Multidetector CT imaging of the chest, abdomen and pelvis was performed following the standard protocol during bolus administration of intravenous contrast. CONTRAST:  76m OMNIPAQUE IOHEXOL 300 MG/ML  SOLN COMPARISON:  Most recent CT chest, abdomen and pelvis 11/21/2020. 06/18/2020 PET-CT. FINDINGS: CT CHEST FINDINGS Cardiovascular: Heart size appears within normal limits. Aortic atherosclerosis. No pericardial effusion. Mediastinum/Nodes: Normal appearance of the thyroid gland. The trachea appears patent and is midline. Normal appearance of the esophagus. No enlarged axillary or supraclavicular lymph nodes. Stable right paratracheal lymph node measuring 1 cm, image 28/2. No mediastinal or hilar adenopathy noted. Lungs/Pleura: Unchanged appearance of loculated right pleural effusion. Pleural thickening overlying the right lung is again noted, thickest within the lung bases. This appears stable when compared with the previous exam. Interlobular septal thickening within the right middle lobe and right lower lobe is identified as before. -index nodule within the juxta mediastinal right middle lobe measures 1.5 by 1.2 cm, image 83/7. Previously 1.7 x 1.2 cm. -index nodule in the lateral segment of right middle lobe measures 9 mm, image 95/7. Stable. -index nodule in the inferior right middle lobe measures 1.9 by 0.9 cm, image 47/2. Previously 2.1 x 0.9 cm. -central right upper lobe lung nodule measures 1.2 by 1.1 cm, image 78/7. Previously 0.4 cm. Stable subpleural nodule in the anterior left upper lobe measuring 3 mm, image 74/7. Musculoskeletal: Scattered sclerotic lesions are stable. No acute findings. CT ABDOMEN PELVIS FINDINGS Hepatobiliary: No focal liver  abnormality is seen. No gallstones, gallbladder wall thickening, or biliary dilatation. Pancreas: Unremarkable. No pancreatic ductal dilatation or surrounding inflammatory changes. Spleen: Normal in size without focal abnormality. Adrenals/Urinary Tract: Normal adrenal glands. No kidney mass or hydronephrosis. Bladder unremarkable. Stomach/Bowel: Stomach is within normal limits. Appendix not confidently identified. No evidence of bowel wall thickening, distention, or inflammatory changes. Vascular/Lymphatic: Aortic atherosclerosis. No aneurysm. No abdominopelvic adenopathy. Reproductive: Prostate gland enlargement. Other: No free fluid or fluid collections. Musculoskeletal: Sclerotic lesion within the right iliac bone  is stable. Within the intertrochanteric portion of the proximal right femur there is a 1 cm sclerotic lesion. Previously 5 mm. Sclerotic lesion within the L1 vertebral body is unchanged, image 99/6. IMPRESSION: 1. Central right upper lobe pulmonary nodule has increased in size compared with previous imaging. This measures 1.2 cm on today's exam versus 0.4 cm previously. Suspicious for active malignancy. Previously referenced index lesions within the right middle lobe are relatively stable. 2. Unchanged appearance of chronic loculated right pleural effusion with overlying pleural thickening. 3. Persistence of interlobular septal thickening in the right middle and right lower lobes, worrisome for lymphangitic carcinomatosis. 4. Multifocal sclerotic bone metastases. Small lesion within the intertrochanteric portions of the proximal right femur has increased in size from the previous exam. Remaining lesions are stable. 5. Aortic atherosclerosis. Aortic Atherosclerosis (ICD10-I70.0). Electronically Signed   By: Kerby Moors M.D.   On: 01/03/2021 10:52   CT Abdomen Pelvis W Contrast  Result Date: 01/03/2021 CLINICAL DATA:  Primary Cancer Type: Lung Imaging Indication: Assess response to therapy  Interval therapy since last imaging? Yes Initial Cancer Diagnosis Date: 03/29/2019; Established by: Biopsy-proven Detailed Pathology: Stage IV non-small cell lung cancer, adenocarcinoma. Primary Tumor location: Right middle lobe. Brain and skeletal metastases. Surgeries: Hernia repair. Lumbar laminectomy. Chemotherapy: Yes; Ongoing?  Yes; Tagrisso daily. Immunotherapy?  Yes; Type: Keytruda; Ongoing? No Radiation therapy? Yes; Date Range: 03/2019, 01/2020, 06/2020; 12/19/2020 - 01/01/2021; Target: SRS to the metastatic brain lesions EXAM: CT CHEST, ABDOMEN, AND PELVIS WITH CONTRAST TECHNIQUE: Multidetector CT imaging of the chest, abdomen and pelvis was performed following the standard protocol during bolus administration of intravenous contrast. CONTRAST:  19m OMNIPAQUE IOHEXOL 300 MG/ML  SOLN COMPARISON:  Most recent CT chest, abdomen and pelvis 11/21/2020. 06/18/2020 PET-CT. FINDINGS: CT CHEST FINDINGS Cardiovascular: Heart size appears within normal limits. Aortic atherosclerosis. No pericardial effusion. Mediastinum/Nodes: Normal appearance of the thyroid gland. The trachea appears patent and is midline. Normal appearance of the esophagus. No enlarged axillary or supraclavicular lymph nodes. Stable right paratracheal lymph node measuring 1 cm, image 28/2. No mediastinal or hilar adenopathy noted. Lungs/Pleura: Unchanged appearance of loculated right pleural effusion. Pleural thickening overlying the right lung is again noted, thickest within the lung bases. This appears stable when compared with the previous exam. Interlobular septal thickening within the right middle lobe and right lower lobe is identified as before. -index nodule within the juxta mediastinal right middle lobe measures 1.5 by 1.2 cm, image 83/7. Previously 1.7 x 1.2 cm. -index nodule in the lateral segment of right middle lobe measures 9 mm, image 95/7. Stable. -index nodule in the inferior right middle lobe measures 1.9 by 0.9 cm, image 47/2.  Previously 2.1 x 0.9 cm. -central right upper lobe lung nodule measures 1.2 by 1.1 cm, image 78/7. Previously 0.4 cm. Stable subpleural nodule in the anterior left upper lobe measuring 3 mm, image 74/7. Musculoskeletal: Scattered sclerotic lesions are stable. No acute findings. CT ABDOMEN PELVIS FINDINGS Hepatobiliary: No focal liver abnormality is seen. No gallstones, gallbladder wall thickening, or biliary dilatation. Pancreas: Unremarkable. No pancreatic ductal dilatation or surrounding inflammatory changes. Spleen: Normal in size without focal abnormality. Adrenals/Urinary Tract: Normal adrenal glands. No kidney mass or hydronephrosis. Bladder unremarkable. Stomach/Bowel: Stomach is within normal limits. Appendix not confidently identified. No evidence of bowel wall thickening, distention, or inflammatory changes. Vascular/Lymphatic: Aortic atherosclerosis. No aneurysm. No abdominopelvic adenopathy. Reproductive: Prostate gland enlargement. Other: No free fluid or fluid collections. Musculoskeletal: Sclerotic lesion within the right iliac bone is stable. Within  the intertrochanteric portion of the proximal right femur there is a 1 cm sclerotic lesion. Previously 5 mm. Sclerotic lesion within the L1 vertebral body is unchanged, image 99/6. IMPRESSION: 1. Central right upper lobe pulmonary nodule has increased in size compared with previous imaging. This measures 1.2 cm on today's exam versus 0.4 cm previously. Suspicious for active malignancy. Previously referenced index lesions within the right middle lobe are relatively stable. 2. Unchanged appearance of chronic loculated right pleural effusion with overlying pleural thickening. 3. Persistence of interlobular septal thickening in the right middle and right lower lobes, worrisome for lymphangitic carcinomatosis. 4. Multifocal sclerotic bone metastases. Small lesion within the intertrochanteric portions of the proximal right femur has increased in size from the  previous exam. Remaining lesions are stable. 5. Aortic atherosclerosis. Aortic Atherosclerosis (ICD10-I70.0). Electronically Signed   By: Kerby Moors M.D.   On: 01/03/2021 10:52     ASSESSMENT/PLAN:  This isa very pleasant80 year old Caucasian male who was diagnosed with stage IV non-small cell lung cancer, adenocarcinoma with a positive EGFR resistant mutation in exon 20.He presented with extensive disease involving the right upper lobe, right middle lobe, and right lower lobe with extensive pleural-based metastases. He also has mediastinal and hilar adenopathy and a rightmalignant pleural effusion. He also has metastatic disease to the brain and a suspicious liver lesion.  He completed SRS treatment to the metastatic disease to the brain under care of Dr. Tammi Klippel.  He had a pleurex catheter placed under the care of Dr. Roxan Hockey for his malignant pleural effusion.This was removed 07/11/2019  He then had Coy treatment to the another suspicious brain lesion on 01/23/20 under the care of Dr. Tammi Klippel as well as in November 2021.   He previously underwent systsystemic chemotherapy with carboplatin AUC of 5, Alimta 500 mg/m, and Keytruda 200 mg IV every 3 weeks. He is status post20cycles. Starting from cycle #5, he had been on maintenance Alimta 500 mg/m2 and Keytruda 200 mg IV every 3 weeks.Beryle Flock was discontinued due topneumonitisstarting from cycle #17.This was discontinued after cycle #21 due to evidence for disease progression.   He had repeat PET scan and MRI of the brain performed.  The PET scan showed evidence for disease progression with metastatic disease involving the thoracic and abdominal lymph nodes as well as the right hemithorax and bones.  He also has loculated moderate right pleural effusion and small left pleural effusion.  The PET scan also showed prostate enlargement and his PSA has been rising recently.  He was seen by urology and is being followed.  He then  underwent treatment withthe new targeted agent Mobocertinib (Axkibity) that was recently approved by the FDA for patient with EGFR exon 20 mutation.He started this on 06/29/20. This was discontinued in May 2022 after the development of numerous new metastatic brain lesions.    He completed whole brain radiation under the care of Dr. Tammi Klippel in May 2022.   He is currently undergoing oral targeted treatment with Tagrisso 80 mg p.o. daily. His first dose was on 01/13/21.   The patient was seen with Dr. Julien Nordmann today. The patient recently had a restaging CT scan before starting Tagrisso to re-establish a new baseline.  Dr. Julien Nordmann personally and independently reviewed the scan and discussed the results with the patient today.  The scan showed stable disease except for an increase in size of a right upper lobe pulmonary nodule.  Dr. Julien Nordmann recommends that he continue on tagrisso.   We will see him back for follow-up  visit in 2 weeks for evaluation and to review his lab results  He will continue to follow with Urology for his prostate cancer.   Regarding the prior mouth sore, I will send in a refill of his Magic mouthwash in case he should develop painful mouth sores in the future.  However, I discussed I do not want him to use this prophylactically and only to use as needed.   For the cough, I have sent the patient a prescription for Hycodan.  The patient has some questions regarding the duration of treatment of the Namenda.  He requested that I reach out to radiation oncology for clarification.  I have sent them a message.   The patient was advised to call immediately if he has any concerning symptoms in the interval. The patient voices understanding of current disease status and treatment options and is in agreement with the current care plan. All questions were answered. The patient knows to call the clinic with any problems, questions or concerns. We can certainly see the patient much sooner if  necessary         Orders Placed This Encounter  Procedures  . CBC with Differential (Cancer Center Only)    Standing Status:   Future    Standing Expiration Date:   01/14/2022  . CMP ([facility] only)    Standing Status:   Future    Standing Expiration Date:   01/14/2022       Ethan Sos Ayinde Swim, PA-C 01/14/21  ADDENDUM: Hematology/Oncology Attending: I had a face-to-face encounter with the patient today.  I reviewed his records, lab and scan and recommended his care plan.  This is a very pleasant 70 years old white male with a stage IV non-small cell lung cancer, adenocarcinoma with positive EGFR mutation but in exon 20.  Diagnosed in July 2020.  He is status post treatment to multiple brain metastasis with SRS.  He also had systemic chemotherapy initially with carboplatin, Alimta and Keytruda for 4 cycles followed by maintenance treatment with Alimta and Keytruda for a total of 21 cycles.  His treatment was discontinued secondary to disease progression. 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 He underwent whole brain irradiation recently. I recommended for the patient to discontinue his treatment with Mobocertinib because of the worsening disease especially in the brain. He had repeat CT scan of the chest, abdomen pelvis performed recently.  I personally and independently reviewed the scan images and discussed the result with the patient today.  His scan showed stable disease except for enlarged left lung nodule increased from 4 mm to 12 mm. I recommended for the patient to proceed with his treatment with Tagrisso 80 mg p.o. daily as previously discussed.  He started the first dose of his treatment yesterday. We will continue to monitor him closely on this treatment.  He will come back for follow-up visit in around 2 weeks for evaluation and  repeat blood work. We will try to repeat his imaging studies in around 6-8 weeks including MRI of the brain. The patient was advised to call immediately if he has any other concerning symptoms in the interval.  The total time spent in the appointment was 35 minutes. Disclaimer: This note was dictated with voice recognition software. Similar sounding words can inadvertently be transcribed and may be missed upon review. Eilleen Kempf, MD 01/14/21

## 2021-01-13 ENCOUNTER — Encounter: Payer: Self-pay | Admitting: Medical

## 2021-01-14 ENCOUNTER — Encounter: Payer: Self-pay | Admitting: Physician Assistant

## 2021-01-14 ENCOUNTER — Inpatient Hospital Stay (HOSPITAL_BASED_OUTPATIENT_CLINIC_OR_DEPARTMENT_OTHER): Payer: Medicare Other | Admitting: Physician Assistant

## 2021-01-14 ENCOUNTER — Other Ambulatory Visit: Payer: Self-pay

## 2021-01-14 VITALS — BP 138/79 | HR 87 | Temp 98.3°F | Resp 18 | Wt 185.5 lb

## 2021-01-14 DIAGNOSIS — C7951 Secondary malignant neoplasm of bone: Secondary | ICD-10-CM | POA: Diagnosis not present

## 2021-01-14 DIAGNOSIS — C7989 Secondary malignant neoplasm of other specified sites: Secondary | ICD-10-CM | POA: Diagnosis not present

## 2021-01-14 DIAGNOSIS — C3491 Malignant neoplasm of unspecified part of right bronchus or lung: Secondary | ICD-10-CM | POA: Diagnosis not present

## 2021-01-14 DIAGNOSIS — C787 Secondary malignant neoplasm of liver and intrahepatic bile duct: Secondary | ICD-10-CM | POA: Diagnosis not present

## 2021-01-14 DIAGNOSIS — C7931 Secondary malignant neoplasm of brain: Secondary | ICD-10-CM | POA: Diagnosis not present

## 2021-01-14 DIAGNOSIS — J910 Malignant pleural effusion: Secondary | ICD-10-CM | POA: Diagnosis not present

## 2021-01-14 DIAGNOSIS — C342 Malignant neoplasm of middle lobe, bronchus or lung: Secondary | ICD-10-CM | POA: Diagnosis not present

## 2021-01-14 DIAGNOSIS — C61 Malignant neoplasm of prostate: Secondary | ICD-10-CM | POA: Diagnosis not present

## 2021-01-14 MED ORDER — HYDROCODONE BIT-HOMATROP MBR 5-1.5 MG/5ML PO SOLN: 5.0000 mL | Freq: Four times a day (QID) | ORAL | 0 refills | Status: DC | PRN

## 2021-01-14 MED ORDER — MAGIC MOUTHWASH: 5.0000 mL | Freq: Four times a day (QID) | ORAL | 0 refills | Status: AC | PRN

## 2021-01-15 ENCOUNTER — Telehealth: Payer: Self-pay | Admitting: Radiation Therapy

## 2021-01-15 ENCOUNTER — Telehealth: Payer: Self-pay | Admitting: Physician Assistant

## 2021-01-15 NOTE — Telephone Encounter (Signed)
Scheduled per los. Called and spoke with patient. Confirmed appt 

## 2021-01-15 NOTE — Telephone Encounter (Signed)
Called Mr. Fulco to clarify that the prescription for the prophylactic Namenda is actually for a total of 6 months instead of 6 weeks, but if it is cost prohibitive, it is not an absolute must.  Based on the studies, the most benefit was gained when used for 6 months post whole brain radiation.  Once we started talking about this being a 6 month course, he remembered his conversation with Ashlyn and was on board to continue. It was not an issue with cost, he just did not want to purchase medication that he wasn't sure would be used.    Mont Dutton R.T.(R)(T) Radiation Special Procedures Navigator

## 2021-01-15 NOTE — Telephone Encounter (Signed)
Pt was seen for OV yesterday and advised as follow: "Regarding the prior mouth sore, I will send in a refill of his Magic mouthwash in case he should develop painful mouth sores in the future.  However, I discussed I do not want him to use this prophylactically and only to use as needed".

## 2021-01-23 NOTE — Progress Notes (Signed)
[facility] OFFICE PROGRESS NOTE  Laurey Morale, MD [place] [address]  DIAGNOSIS: Stage IV (T2b, N2, M1c)presented with right middle lobe lung mass in addition to mediastinal lymphadenopathy as well as malignant right pleural effusion with pleural-based nodules and suspicious right hepatic lesion and the brain metastasis diagnosed in July 2020.  Molecular Biomarkers: [id number] (Exon 20 insertion) 0.2% Dacomitinib,Neratinib,Osimertinib  [id number] 0.1% None  PRIOR THERAPY: 1)SRS treatment to the metastatic disease to the brain under the care of Dr. Tammi Klippel. Completed on 04/07/2019. 2) SRS to the suspicious small brain lesion in the thalamocapsular region 01/23/2020 under the care of Dr. Tammi Klippel 3)Systemic chemotherapy with carboplatin for AUC of 5, Alimta 500 mg/M2 and Keytruda 200 mg IV every 3 weeks. First dose April 04, 2019. Status post 21 cycles Starting from cycle #5 the patient is on maintenance treatment with Alimta and Keytruda every 3 weeks. Starting from cycle #19 he will be on single agent Alimta. 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 4) SRS to the metastatic brain lesions under the care of Dr. Tammi Klippel on 07/03/20 5)whole brain irradiation for multiple metastatic brain lesions in May 3338 6)Mobocertinib (Axkibity) 329 mg p.o. daily. First dose was June 29, 2020. Status post 73months of treatment.This was discontinued secondary to disease progression in the brain as well as the chest in May 2022  CURRENT THERAPY: Targeted therapy with Tagrisso 80 mg p.o. daily. First dose 01/13/21  INTERVAL HISTORY: Ethan Brown 70 y.o. male returns to the clinic today for a follow-up visit.  In early May 2022, the patient was found to have multiple metastatic brain lesions for which he completed whole brain radiation under the care of Dr. Tammi Klippel.  Due to the significant disease  progression in the brain, Dr. Julien Nordmann discontinued his targeted treatment with Mobocertinib.  Dr. Julien Nordmann had discussed with him several options for management of his condition including with infusion with Amivantamab versus systemic chemotherapy versus treatment with oral Tagrisso which also has some activity in exon 20 patients with the ability to close the bloodbrain barrier which may help with his brain metastasis. The patient opted to start on oral targeted treatment with Tagrisso. He started this on 01/13/21. He is tolerating this better than his last treatment. He notes resolution of diarrhea as well as improvement in the skin rash as well as the dry nasal mucosa.   Today, the patient denies any recent fever, chills, or night sweats. His weight is stable but he has a decreased appetite. He makes sure to eat 3 meals a day. At his last visit, he was endorsing a cough. Therefore, he was given a prescription for hycodan at his last appointment. His cough is similar to before, perhaps slightly improved per patient report. He denies changes with shortness of breath. He walks regularly. He describes his breathing as less capacity for breath but denies shortness of breath. He denies chest pain or hemoptysis. Denies any nausea or vomiting.  Denies any constipation.  He denies recent headaches or visual changes.  He is here today for evaluation and repeat blood work.    MEDICAL HISTORY: Past Medical History:  Diagnosis Date  . Borderline hypertension   . Brain metastasis ([facility]) dx'd 01/2019  . Detached vitreous humor, left   . Dyspnea   . ED (erectile dysfunction) of organic origin   . Hyperlipidemia   . Hypertension   . Lens subluxation, left   . Lung cancer ([facility]) dx'd 01/2019  Stage IV  . Migraine headache    takes PRN Imitrex  . Pseudophakia   . TGA (transient global amnesia) 2017    ALLERGIES:  has No Known Allergies.  MEDICATIONS:  Current Outpatient Medications  Medication Sig Dispense  Refill  . methylPREDNISolone (MEDROL DOSEPAK) 4 MG TBPK tablet Use as instructed 21 tablet 0  . cyanocobalamin (,VITAMIN B-12,) 1000 MCG/ML injection INJECT 1 ML (1,000 MCG TOTAL) INTO THE MUSCLE ONCE FOR 1 DOSE. 5 mL 1  . cycloSPORINE (RESTASIS) 0.05 % ophthalmic emulsion Place 1 drop into both eyes 2 (two) times daily.    . hydrochlorothiazide (MICROZIDE) 12.5 MG capsule TAKE 1 CAPSULE BY MOUTH EVERY DAY 90 capsule 3  . HYDROcodone bit-homatropine (HYCODAN) 5-1.5 MG/5ML syrup Take 5 mLs by mouth every 6 (six) hours as needed for cough. 473 mL 0  . hydrocortisone 1 % lotion Apply 1 application topically 2 (two) times daily. 113 g 0  . lidocaine (XYLOCAINE) 2 % solution Use as directed 5 mLs in the mouth or throat every 3 (three) hours as needed for mouth pain. Gargle and spit 200 mL 2  . lidocaine-prilocaine (EMLA) cream Apply to the Port-A-Cath site 30-60 minutes before treatment 30 g 0  . LORazepam (ATIVAN) 1 MG tablet Take 1 tablet (1 mg total) by mouth as needed for anxiety (30 minutes prior to radiation treatment and may repeat just prior to procedure if needed.). 10 tablet 0  . magic mouthwash SOLN Take 5 mLs by mouth 4 (four) times daily as needed for mouth pain. Swish and spit or swallow 240 mL 0  . magnesium oxide (MAG-OX) 400 MG tablet Take 1 tablet (400 mg total) by mouth daily. 30 tablet 1  . memantine (NAMENDA) 10 MG tablet Take 1 tablet (10 mg total) by mouth as directed. 24m qd x 1 week, then 574mBID x 1 week, then 103mm and 5mg90m x 1 week and then 10mg15m thereafter. 36 tablet 0  . memantine (NAMENDA) 10 MG tablet TAKE 1 TABLET BY MOUTH TWICE A DAY 180 tablet 2  . Multiple Vitamin (MULTIVITAMIN WITH MINERALS) TABS tablet Take 1 tablet by mouth daily.    . omeMarland Kitchenrazole (PRILOSEC OTC) 20 MG tablet Take 20 mg by mouth daily.    . osiMarland Kitchenertinib mesylate (TAGRISSO) 80 MG tablet Take 1 tablet (80 mg total) by mouth daily. 30 tablet 2  . prochlorperazine (COMPAZINE) 10 MG tablet Take 1  tablet (10 mg total) by mouth every 6 (six) hours as needed for nausea or vomiting. 30 tablet 1  . sildenafil (REVATIO) 20 MG tablet TAKE AS DIRECTED 10 tablet 11  . sulfamethoxazole-trimethoprim (BACTRIM DS) 800-160 MG tablet Take 1 tablet by mouth 2 (two) times daily. 14 tablet 0  . tamsulosin (FLOMAX) 0.4 MG CAPS capsule Take 0.4 mg by mouth daily.     No current facility-administered medications for this visit.    SURGICAL HISTORY:  Past Surgical History:  Procedure Laterality Date  . CAROTID DOPPLERS  09/2017   Mild non-obstructive disease  . CATARACT EXTRACTION, BILATERAL    . cataract, left  2018  . CHEST TUBE INSERTION Right 03/29/2019   Procedure: INSERTION PLEURAL DRAINAGE CATHETER;  Surgeon: HendrMelrose Nakayama  [facility];  Laterality: Right;  . COLONOSCOPY  2016   clear, repeat in 10 yrs   . Eye surgeries     , Lens attachment.  Vitrectomy  . FEMORAL HERNIA REPAIR    . HERNIA  REPAIR    . IR THORACENTESIS ASP PLEURAL SPACE W/IMG GUIDE  02/22/2019  . IR THORACENTESIS ASP PLEURAL SPACE W/IMG GUIDE  03/13/2019  . IR THORACENTESIS ASP PLEURAL SPACE W/IMG GUIDE  03/18/2020  . LUMBAR LAMINECTOMY    . PLEURAL BIOPSY Right 03/29/2019   Procedure: PLEURAL BIOPSY;  Surgeon: [name], MD;  Location: [facility];  Service: Thoracic;  Laterality: Right;  . PORTACATH PLACEMENT N/A 03/29/2019   Procedure: INSERTION PORT-A-CATH;  Surgeon: [name], MD;  Location: [facility];  Service: Thoracic;  Laterality: N/A;  . retinal attachment, right    . sclearl buckle, right    . TRANSTHORACIC ECHOCARDIOGRAM  12/14/2018   EF 50 EF 55% (low normal).  Abnormal septal wall motion related to BBB; indeterminate diastolic parameters.  Mild RV enlargement with mildly reduced function.  Normal valves.  Normal atrial sizes.  . victrectomy,right    . VIDEO ASSISTED THORACOSCOPY Right 03/29/2019   Procedure: VIDEO ASSISTED THORACOSCOPY;  Surgeon: [name], MD;  Location: [facility];  Service: Thoracic;  Laterality: Right;    REVIEW OF SYSTEMS:   Review of Systems  Constitutional: Positive for fatigue and decreased appetite. Negative for chills, fever and unexpected weight change.  HENT: Negative for mouth sores, nosebleeds, sore throat and trouble swallowing.   Eyes: Negative for eye problems and icterus.  Respiratory: Positive cough. Negative for hemoptysis, shortness of breath and wheezing.   Cardiovascular: Negative for chest pain and leg swelling.  Gastrointestinal: Negative for abdominal pain, constipation, diarrhea, nausea and vomiting.  Genitourinary: Negative for bladder incontinence, difficulty urinating, dysuria, frequency and hematuria.   Musculoskeletal: Negative for back pain, gait problem, neck pain and neck stiffness.  Skin: Negative for itching and rash.  Neurological: Negative for dizziness, extremity weakness, gait problem, headaches, light-headedness and seizures.  Hematological: Negative for adenopathy. Does not bruise/bleed easily.  Psychiatric/Behavioral: Negative for confusion, depression and sleep disturbance. The patient is not nervous/anxious.     PHYSICAL EXAMINATION:  Blood pressure 123/67, pulse 82, temperature 98.3 F (36.8 C), resp. rate 18, weight 185 lb 11.2 oz (84.2 kg), SpO2 97 %.  ECOG PERFORMANCE STATUS: 1 - Symptomatic but completely ambulatory  Physical Exam  Constitutional: Oriented to person, place, and time and well-developed, well-nourished, and in no distress.  HENT:  Head: Normocephalic and atraumatic.  Mouth/Throat: Oropharynx is clear and moist. No oropharyngeal exudate.  Eyes: Conjunctivae are normal. Right eye exhibits no discharge. Left eye exhibits no discharge. No scleral icterus.  Neck: Normal range of motion. Neck supple.  Cardiovascular: Normal rate, regular rhythm, normal heart sounds and intact distal pulses.   Pulmonary/Chest: Effort normal. Decreased breath sounds in the right  lung. No respiratory distress. No wheezes. No rales.  Abdominal: Soft. Bowel sounds are normal. Exhibits no distension and no mass. There is no tenderness.  Musculoskeletal: Normal range of motion. Exhibits no edema.  Lymphadenopathy:    No cervical adenopathy.  Neurological: Alert and oriented to person, place, and time. Exhibits normal muscle tone. Gait normal. Coordination normal.  Skin: Skin is warm and dry. No rash noted. Not diaphoretic. No erythema. No pallor.  Psychiatric: Mood, memory and judgment normal.  Vitals reviewed.  LABORATORY DATA: Lab Results  Component Value Date   WBC 5.5 01/28/2021   HGB 12.8 (L) 01/28/2021   HCT 38.1 (L) 01/28/2021   MCV 92.5 01/28/2021   PLT 171 01/28/2021      Chemistry      Component Value Date/Time   NA  138 01/28/2021 1433   K 4.1 01/28/2021 1433   CL 101 01/28/2021 1433   CO2 27 01/28/2021 1433   BUN 26 (H) 01/28/2021 1433   CREATININE 1.27 (H) 01/28/2021 1433      Component Value Date/Time   CALCIUM 9.4 01/28/2021 1433   ALKPHOS 110 01/28/2021 1433   AST 18 01/28/2021 1433   ALT 11 01/28/2021 1433   BILITOT 0.4 01/28/2021 1433       RADIOGRAPHIC STUDIES:  CT Chest W Contrast  Result Date: 01/03/2021 CLINICAL DATA:  Primary Cancer Type: Lung Imaging Indication: Assess response to therapy Interval therapy since last imaging? Yes Initial Cancer Diagnosis Date: 03/29/2019; Established by: Biopsy-proven Detailed Pathology: Stage IV non-small cell lung cancer, adenocarcinoma. Primary Tumor location: Right middle lobe. Brain and skeletal metastases. Surgeries: Hernia repair. Lumbar laminectomy. Chemotherapy: Yes; Ongoing?  Yes; Tagrisso daily. Immunotherapy?  Yes; Type: Keytruda; Ongoing? No Radiation therapy? Yes; Date Range: 03/2019, 01/2020, 06/2020; 12/19/2020 - 01/01/2021; Target: SRS to the metastatic brain lesions EXAM: CT CHEST, ABDOMEN, AND PELVIS WITH CONTRAST TECHNIQUE: Multidetector CT imaging of the chest, abdomen and  pelvis was performed following the standard protocol during bolus administration of intravenous contrast. CONTRAST:  70mL OMNIPAQUE IOHEXOL 300 MG/ML  SOLN COMPARISON:  Most recent CT chest, abdomen and pelvis 11/21/2020. 06/18/2020 PET-CT. FINDINGS: CT CHEST FINDINGS Cardiovascular: Heart size appears within normal limits. Aortic atherosclerosis. No pericardial effusion. Mediastinum/Nodes: Normal appearance of the thyroid gland. The trachea appears patent and is midline. Normal appearance of the esophagus. No enlarged axillary or supraclavicular lymph nodes. Stable right paratracheal lymph node measuring 1 cm, image 28/2. No mediastinal or hilar adenopathy noted. Lungs/Pleura: Unchanged appearance of loculated right pleural effusion. Pleural thickening overlying the right lung is again noted, thickest within the lung bases. This appears stable when compared with the previous exam. Interlobular septal thickening within the right middle lobe and right lower lobe is identified as before. -index nodule within the juxta mediastinal right middle lobe measures 1.5 by 1.2 cm, image 83/7. Previously 1.7 x 1.2 cm. -index nodule in the lateral segment of right middle lobe measures 9 mm, image 95/7. Stable. -index nodule in the inferior right middle lobe measures 1.9 by 0.9 cm, image 47/2. Previously 2.1 x 0.9 cm. -central right upper lobe lung nodule measures 1.2 by 1.1 cm, image 78/7. Previously 0.4 cm. Stable subpleural nodule in the anterior left upper lobe measuring 3 mm, image 74/7. Musculoskeletal: Scattered sclerotic lesions are stable. No acute findings. CT ABDOMEN PELVIS FINDINGS Hepatobiliary: No focal liver abnormality is seen. No gallstones, gallbladder wall thickening, or biliary dilatation. Pancreas: Unremarkable. No pancreatic ductal dilatation or surrounding inflammatory changes. Spleen: Normal in size without focal abnormality. Adrenals/Urinary Tract: Normal adrenal glands. No kidney mass or hydronephrosis.  Bladder unremarkable. Stomach/Bowel: Stomach is within normal limits. Appendix not confidently identified. No evidence of bowel wall thickening, distention, or inflammatory changes. Vascular/Lymphatic: Aortic atherosclerosis. No aneurysm. No abdominopelvic adenopathy. Reproductive: Prostate gland enlargement. Other: No free fluid or fluid collections. Musculoskeletal: Sclerotic lesion within the right iliac bone is stable. Within the intertrochanteric portion of the proximal right femur there is a 1 cm sclerotic lesion. Previously 5 mm. Sclerotic lesion within the L1 vertebral body is unchanged, image 99/6. IMPRESSION: 1. Central right upper lobe pulmonary nodule has increased in size compared with previous imaging. This measures 1.2 cm on today's exam versus 0.4 cm previously. Suspicious for active malignancy. Previously referenced index lesions within the right middle lobe are relatively stable. 2. Unchanged appearance  of chronic loculated right pleural effusion with overlying pleural thickening. 3. Persistence of interlobular septal thickening in the right middle and right lower lobes, worrisome for lymphangitic carcinomatosis. 4. Multifocal sclerotic bone metastases. Small lesion within the intertrochanteric portions of the proximal right femur has increased in size from the previous exam. Remaining lesions are stable. 5. Aortic atherosclerosis. Aortic Atherosclerosis (ICD10-I70.0). Electronically Signed   By: Kerby Moors M.D.   On: 01/03/2021 10:52   CT Abdomen Pelvis W Contrast  Result Date: 01/03/2021 CLINICAL DATA:  Primary Cancer Type: Lung Imaging Indication: Assess response to therapy Interval therapy since last imaging? Yes Initial Cancer Diagnosis Date: 03/29/2019; Established by: Biopsy-proven Detailed Pathology: Stage IV non-small cell lung cancer, adenocarcinoma. Primary Tumor location: Right middle lobe. Brain and skeletal metastases. Surgeries: Hernia repair. Lumbar laminectomy.  Chemotherapy: Yes; Ongoing?  Yes; Tagrisso daily. Immunotherapy?  Yes; Type: Keytruda; Ongoing? No Radiation therapy? Yes; Date Range: 03/2019, 01/2020, 06/2020; 12/19/2020 - 01/01/2021; Target: SRS to the metastatic brain lesions EXAM: CT CHEST, ABDOMEN, AND PELVIS WITH CONTRAST TECHNIQUE: Multidetector CT imaging of the chest, abdomen and pelvis was performed following the standard protocol during bolus administration of intravenous contrast. CONTRAST:  41mL OMNIPAQUE IOHEXOL 300 MG/ML  SOLN COMPARISON:  Most recent CT chest, abdomen and pelvis 11/21/2020. 06/18/2020 PET-CT. FINDINGS: CT CHEST FINDINGS Cardiovascular: Heart size appears within normal limits. Aortic atherosclerosis. No pericardial effusion. Mediastinum/Nodes: Normal appearance of the thyroid gland. The trachea appears patent and is midline. Normal appearance of the esophagus. No enlarged axillary or supraclavicular lymph nodes. Stable right paratracheal lymph node measuring 1 cm, image 28/2. No mediastinal or hilar adenopathy noted. Lungs/Pleura: Unchanged appearance of loculated right pleural effusion. Pleural thickening overlying the right lung is again noted, thickest within the lung bases. This appears stable when compared with the previous exam. Interlobular septal thickening within the right middle lobe and right lower lobe is identified as before. -index nodule within the juxta mediastinal right middle lobe measures 1.5 by 1.2 cm, image 83/7. Previously 1.7 x 1.2 cm. -index nodule in the lateral segment of right middle lobe measures 9 mm, image 95/7. Stable. -index nodule in the inferior right middle lobe measures 1.9 by 0.9 cm, image 47/2. Previously 2.1 x 0.9 cm. -central right upper lobe lung nodule measures 1.2 by 1.1 cm, image 78/7. Previously 0.4 cm. Stable subpleural nodule in the anterior left upper lobe measuring 3 mm, image 74/7. Musculoskeletal: Scattered sclerotic lesions are stable. No acute findings. CT ABDOMEN PELVIS FINDINGS  Hepatobiliary: No focal liver abnormality is seen. No gallstones, gallbladder wall thickening, or biliary dilatation. Pancreas: Unremarkable. No pancreatic ductal dilatation or surrounding inflammatory changes. Spleen: Normal in size without focal abnormality. Adrenals/Urinary Tract: Normal adrenal glands. No kidney mass or hydronephrosis. Bladder unremarkable. Stomach/Bowel: Stomach is within normal limits. Appendix not confidently identified. No evidence of bowel wall thickening, distention, or inflammatory changes. Vascular/Lymphatic: Aortic atherosclerosis. No aneurysm. No abdominopelvic adenopathy. Reproductive: Prostate gland enlargement. Other: No free fluid or fluid collections. Musculoskeletal: Sclerotic lesion within the right iliac bone is stable. Within the intertrochanteric portion of the proximal right femur there is a 1 cm sclerotic lesion. Previously 5 mm. Sclerotic lesion within the L1 vertebral body is unchanged, image 99/6. IMPRESSION: 1. Central right upper lobe pulmonary nodule has increased in size compared with previous imaging. This measures 1.2 cm on today's exam versus 0.4 cm previously. Suspicious for active malignancy. Previously referenced index lesions within the right middle lobe are relatively stable. 2. Unchanged appearance of chronic loculated  right pleural effusion with overlying pleural thickening. 3. Persistence of interlobular septal thickening in the right middle and right lower lobes, worrisome for lymphangitic carcinomatosis. 4. Multifocal sclerotic bone metastases. Small lesion within the intertrochanteric portions of the proximal right femur has increased in size from the previous exam. Remaining lesions are stable. 5. Aortic atherosclerosis. Aortic Atherosclerosis (ICD10-I70.0). Electronically Signed   By: Kerby Moors M.D.   On: 01/03/2021 10:52     ASSESSMENT/PLAN:  This isa very pleasant24 year old Caucasian male who was diagnosed with stage IV non-small cell  lung cancer, adenocarcinoma with a positive EGFR resistant mutation in exon 20.He presented with extensive disease involving the right upper lobe, right middle lobe, and right lower lobe with extensive pleural-based metastases. He also has mediastinal and hilar adenopathy and a rightmalignant pleural effusion. He also has metastatic disease to the brain and a suspicious liver lesion.  He completed SRS treatment to the metastatic disease to the brain under care of Dr. Tammi Klippel.  He had a pleurex catheter placed under the care of Dr. Roxan Hockey for his malignant pleural effusion.This was removed 07/11/2019  Hethenhad Davy treatment to the another suspicious brain lesion on 01/23/20 under the care of Dr. Tammi Klippel as well as in November 2021.   He previously underwent systsystemic chemotherapy with carboplatin AUC of 5, Alimta 500 mg/m, and Keytruda 200 mg IV every 3 weeks. He is status post20cycles. Starting from cycle #5, he hadbeen on maintenance Alimta 500 mg/m2 and Keytruda 200 mg IV every 3 weeks.Beryle Flock was discontinued due topneumonitisstarting from cycle #17.This was discontinued after cycle #21 due to evidence for disease progression.   He had repeat PET scan and MRI of the brain performed. The PET scan showed evidence for disease progression with metastatic disease involving the thoracic and abdominal lymph nodes as well as the right hemithorax and bones. He also has loculated moderate right pleural effusion and small left pleural effusion. The PET scan also showed prostate enlargement and his PSA has been rising recently. He was seen by urology and is being followed.  He then underwent treatment withthe new targeted agent Mobocertinib (Axkibity) that was recently approved by the FDA for patient with EGFR exon 20 mutation.He started this on 06/29/20. This was discontinued in May 2022 after the development of numerous new metastatic brain lesions.    He completed whole  brain radiation under the care of Dr. Tammi Klippel in May 2022.   He is currently undergoing oral targeted treatment with Tagrisso 80 mg p.o. daily. His first dose was on 01/13/21.   The patient was seen with Dr. Julien Nordmann today. Labs were reviewed. Recommend that he proceed with the same treatment at the same dose.   We will see him back for follow-up visit in 2 weeks for evaluation and to review his lab results. We will repeat a CT chest, abdomen, and pelvis in 4 weeks. Will will also like brain MRI at that time as well. He follows up with radiation oncology next week. If not ordered at that time, we will arrange a follow up MRI at his appointment in 2 weeks.   He will continue to follow with Urology for his prostate cancer.   He will continue taking hycodan for his cough. We will also send a prescription for a medrol dosepak.   The patient was advised to call immediately if he has any concerning symptoms in the interval. The patient voices understanding of current disease status and treatment options and is in agreement with the current  care plan. All questions were answered. The patient knows to call the clinic with any problems, questions or concerns. We can certainly see the patient much sooner if necessary    Orders Placed This Encounter  Procedures  . CT Chest W Contrast    Standing Status:   Future    Standing Expiration Date:   01/28/2022    Order Specific Question:   If indicated for the ordered procedure, I authorize the administration of contrast media per Radiology protocol    Answer:   Yes    Order Specific Question:   Preferred imaging location?    Answer:   [facility]  . CT Abdomen Pelvis W Contrast    Standing Status:   Future    Standing Expiration Date:   01/28/2022    Order Specific Question:   If indicated for the ordered procedure, I authorize the administration of contrast media per Radiology protocol    Answer:   Yes    Order Specific Question:   Preferred  imaging location?    Answer:   [facility]    Order Specific Question:   Is Oral Contrast requested for this exam?    Answer:   Yes, Per Radiology protocol  . CBC with Differential (Cancer Center Only)    Standing Status:   Future    Standing Expiration Date:   01/28/2022  . CMP ([facility] only)    Standing Status:   Future    Standing Expiration Date:   01/28/2022      Tobe Sos Ianmichael Amescua, PA-C 01/28/21  ADDENDUM: Hematology/Oncology Attending: I had a face-to-face encounter with the patient today.  I reviewed his labs and recommended his care plan.  This is a very pleasant 70 years old white male with a stage IV non-small cell lung cancer, adenocarcinoma with positive EGFR mutation with insertion exon 20 diagnosed in July 2020.  He initially treated with SRS to metastatic brain lesion.  He also has drainage of right pleural effusion with Pleurx catheter. He treated with systemic chemotherapy with carboplatin, Alimta and Keytruda for 4 cycles followed by maintenance treatment with Alimta and Keytruda for a total dose of 21 cycles.  His treatment was discontinued secondary to disease progression. The patient is started second line treatment with Mobocertinib between November 2021 until May 2022 discontinued secondary to disease progression with multiple and innumerable brain metastasis.  He underwent whole brain irradiation.  He also has evidence for systemic disease progression. The patient is started treatment with Tagrisso 80 mg p.o. daily 2 weeks ago and has been tolerating this treatment much better with less cough and diarrhea. Repeat blood work today is unremarkable. I recommended for the patient to continue his treatment with Tagrisso and will consider him for repeat CT scan of the chest, abdomen pelvis as well as MRI of the brain in 4 weeks.  If he has no response to this treatment or evidence for progression, we may consider him for treatment with Amivantamab. The  patient is in agreement with the current plan. He was advised to call immediately if he has any concerning symptoms in the interval.  Disclaimer: This note was dictated with voice recognition software. Similar sounding words can inadvertently be transcribed and may be missed upon review. Eilleen Kempf, MD 01/28/21

## 2021-01-28 ENCOUNTER — Inpatient Hospital Stay: Payer: Medicare Other

## 2021-01-28 ENCOUNTER — Inpatient Hospital Stay: Payer: Medicare Other | Attending: Internal Medicine | Admitting: Physician Assistant

## 2021-01-28 ENCOUNTER — Other Ambulatory Visit: Payer: Self-pay

## 2021-01-28 VITALS — BP 123/67 | HR 82 | Temp 98.3°F | Resp 18 | Wt 185.7 lb

## 2021-01-28 DIAGNOSIS — C342 Malignant neoplasm of middle lobe, bronchus or lung: Secondary | ICD-10-CM | POA: Insufficient documentation

## 2021-01-28 DIAGNOSIS — E785 Hyperlipidemia, unspecified: Secondary | ICD-10-CM | POA: Diagnosis not present

## 2021-01-28 DIAGNOSIS — Z79899 Other long term (current) drug therapy: Secondary | ICD-10-CM | POA: Diagnosis not present

## 2021-01-28 DIAGNOSIS — C7951 Secondary malignant neoplasm of bone: Secondary | ICD-10-CM | POA: Insufficient documentation

## 2021-01-28 DIAGNOSIS — C7989 Secondary malignant neoplasm of other specified sites: Secondary | ICD-10-CM | POA: Diagnosis not present

## 2021-01-28 DIAGNOSIS — Z923 Personal history of irradiation: Secondary | ICD-10-CM | POA: Diagnosis not present

## 2021-01-28 DIAGNOSIS — C787 Secondary malignant neoplasm of liver and intrahepatic bile duct: Secondary | ICD-10-CM | POA: Diagnosis not present

## 2021-01-28 DIAGNOSIS — C61 Malignant neoplasm of prostate: Secondary | ICD-10-CM | POA: Insufficient documentation

## 2021-01-28 DIAGNOSIS — C3491 Malignant neoplasm of unspecified part of right bronchus or lung: Secondary | ICD-10-CM | POA: Diagnosis not present

## 2021-01-28 DIAGNOSIS — J910 Malignant pleural effusion: Secondary | ICD-10-CM | POA: Diagnosis not present

## 2021-01-28 DIAGNOSIS — C7931 Secondary malignant neoplasm of brain: Secondary | ICD-10-CM | POA: Diagnosis not present

## 2021-01-28 DIAGNOSIS — Z7952 Long term (current) use of systemic steroids: Secondary | ICD-10-CM | POA: Insufficient documentation

## 2021-01-28 DIAGNOSIS — I10 Essential (primary) hypertension: Secondary | ICD-10-CM | POA: Insufficient documentation

## 2021-01-28 LAB — CMP (CANCER CENTER ONLY)
ALT: 11 U/L (ref 0–44)
AST: 18 U/L (ref 15–41)
Albumin: 3 g/dL — ABNORMAL LOW (ref 3.5–5.0)
Alkaline Phosphatase: 110 U/L (ref 38–126)
Anion gap: 10 (ref 5–15)
BUN: 26 mg/dL — ABNORMAL HIGH (ref 8–23)
CO2: 27 mmol/L (ref 22–32)
Calcium: 9.4 mg/dL (ref 8.9–10.3)
Chloride: 101 mmol/L (ref 98–111)
Creatinine: 1.27 mg/dL — ABNORMAL HIGH (ref 0.61–1.24)
GFR, Estimated: >60 mL/min (ref >60)
Glucose, Bld: 121 mg/dL — ABNORMAL HIGH (ref 70–99)
Potassium: 4.1 mmol/L (ref 3.5–5.1)
Sodium: 138 mmol/L (ref 135–145)
Total Bilirubin: 0.4 mg/dL (ref 0.3–1.2)
Total Protein: 6.6 g/dL (ref 6.5–8.1)

## 2021-01-28 LAB — CBC WITH DIFFERENTIAL (CANCER CENTER ONLY)
Abs Immature Granulocytes: 0.02 10*3/uL (ref 0.00–0.07)
Basophils Absolute: 0 10*3/uL (ref 0.0–0.1)
Basophils Relative: 0 %
Eosinophils Absolute: 0.1 10*3/uL (ref 0.0–0.5)
Eosinophils Relative: 1 %
HCT: 38.1 % — ABNORMAL LOW (ref 39.0–52.0)
Hemoglobin: 12.8 g/dL — ABNORMAL LOW (ref 13.0–17.0)
Immature Granulocytes: 0 %
Lymphocytes Relative: 12 %
Lymphs Abs: 0.7 10*3/uL (ref 0.7–4.0)
MCH: 31.1 pg (ref 26.0–34.0)
MCHC: 33.6 g/dL (ref 30.0–36.0)
MCV: 92.5 fL (ref 80.0–100.0)
Monocytes Absolute: 0.3 10*3/uL (ref 0.1–1.0)
Monocytes Relative: 5 %
Neutro Abs: 4.4 10*3/uL (ref 1.7–7.7)
Neutrophils Relative %: 82 %
Platelet Count: 171 10*3/uL (ref 150–400)
RBC: 4.12 MIL/uL — ABNORMAL LOW (ref 4.22–5.81)
RDW: 13.2 % (ref 11.5–15.5)
WBC Count: 5.5 10*3/uL (ref 4.0–10.5)
nRBC: 0 % (ref 0.0–0.2)

## 2021-01-28 MED ORDER — METHYLPREDNISOLONE 4 MG PO TBPK: ORAL_TABLET | ORAL | 0 refills | Status: DC

## 2021-01-30 ENCOUNTER — Telehealth: Payer: Self-pay | Admitting: Physician Assistant

## 2021-01-30 NOTE — Progress Notes (Signed)
Patient is aware that this is a phone visit. Patients meaningful use is complete . Patient denies any headaches or pain. Patient reports moderate fatigue. Patient denies any nausea or vomiting. Patient denies any issues with his skin.

## 2021-01-30 NOTE — Telephone Encounter (Signed)
Scheduled per los. Called and spoke with patient. Confirmed appt 

## 2021-01-31 DIAGNOSIS — H04123 Dry eye syndrome of bilateral lacrimal glands: Secondary | ICD-10-CM | POA: Diagnosis not present

## 2021-01-31 DIAGNOSIS — H16223 Keratoconjunctivitis sicca, not specified as Sjogren's, bilateral: Secondary | ICD-10-CM | POA: Diagnosis not present

## 2021-02-05 ENCOUNTER — Other Ambulatory Visit: Payer: Self-pay | Admitting: Radiation Therapy

## 2021-02-05 ENCOUNTER — Other Ambulatory Visit: Payer: Self-pay

## 2021-02-05 ENCOUNTER — Encounter: Payer: Self-pay | Admitting: Internal Medicine

## 2021-02-05 ENCOUNTER — Ambulatory Visit
Admission: RE | Admit: 2021-02-05 | Discharge: 2021-02-05 | Disposition: A | Discharge status: Home / Self Care | Payer: Medicare Other | Source: Ambulatory Visit | Attending: Urology | Admitting: Urology

## 2021-02-05 DIAGNOSIS — C7931 Secondary malignant neoplasm of brain: Secondary | ICD-10-CM

## 2021-02-05 NOTE — Progress Notes (Signed)
.  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    Interval Since Last Radiation:  1 month  12/18/20 - 12/31/20:  The whole brain was treated to 30 Gy in 10 fractions of 3 Gy (HCA)  07/03/20: SRS//PTV3: 68mm right temporal gyrus target was treated to a prescription dose of 20 Gy in a single fraction.   SRS//PTV4: 33mm left periventricular target was treated to a prescription dose of 20 Gy in a single fraction.     01/23/2020: SRS//PTV2: 93mm right thalamocapsular target was treated to a prescription dose of 20 Gy in a single fraction.    04/07/19: SRS// PTV1:  6 mm  right parietal target was treated to a prescription dose of 20 Gy in a single fraction.    Narrative:  I spoke with the patient to conduct his routine scheduled 1 month follow up visit via telephone to spare the patient unnecessary potential exposure in the healthcare setting during the current COVID-19 pandemic.  The patient was notified in advance and gave permission to proceed with this visit format.   He is recovering well from the effects of his recent radiotherapy and is currently without complaints aside from fatigue and memory loss.      He was initially on systemic chemotherapy with carboplatin, Alimta and Keytruda every 3 weeks under the care and direction of Dr. Earlie Server and completed 4 cycles.  His restaging imaging showed a good response to treatment with a decrease in the right pleural effusion, decreased mediastinal and right hilar lymphadenopathy, and improvement in the liver lesion and right middle lobe pulmonary lesion so he was transitioned to maintenance therapy with Alimta and Keytruda, beginning with cycle 5 of his  treatment on 06/27/2019. The Beryle Flock was discontinued after cycle 18 due to recurrent immunotherapy mediated pneumonitis.  Disease restaging imaging with CT C/A/P from 02/09/20 showed continued disease stability so he proceeded with cycle #16 of maintenance treatment on 02/13/20.  He was on single agent Alimta beginning with cycle 19 of maintenance therapy but unfortunately, restaging scans in 05/2020 showed disease progression in the chest, abdomen and bones, confirmed on PET scan 06/18/20 as well as 2 new lesions in the brain, noted on follow up MRI brain scan from 06/20/20. The Alimta maintenance therapy was discontinued in 06/2020 and he was changed to Monocertinib (Axkibity) 564 mg p.o. daily which he tolerated well.  He completed SRS to the 2 new brain lesions on 07/03/20 and tolerated very well.  His posttreatment MRI brain scan from 10/04/20 showed stable-improved disease in the previously treated brain lesions but there was a new 2 mm focus of enhancement in the medial left frontal lobe, indeterminate for a metastasis versus vascular enhancement.  Unfortunately, the short interval MRI brain scan performed on 12/05/2020 showed interval progression of metastatic disease in the brain with numerous new, small enhancing lesions present in the cerebellum bilaterally as well as multiple new lesions present in both cerebral hemispheres and increased size of the left medial frontal lobe lesion that we were monitoring, measuring 5 mm, compatible with metastatic disease.  This was reviewed in our recent multidisciplinary brain clinic and the recommendation iwa to  proceed with whole brain radiotherapy which was completed on 12/31/20 and overall, tolerated well. He was also started on Namenda in early May 2022 which he has continued taking to help reduce/prevent cognitive compromise associated with whole brain radiotherapy. He has continued taking this as prescribed and is tolerating this well. His systemic therapy was  recently changed to oral Tagrisso which he started taking on 01/13/21 and is tolerating well without any ill side effects.            On review of systems, the patient states that he is doing well overall.  He has some residual fatigue resulting in more catnapping/power napping as well as noticeable difficulty with focus and/or memory capacity.  He specifically denies headaches, changes in auditory or visual acuity, nausea, vomiting, dizziness, imbalance, tremors or seizure activity.  He has not had recent fevers, chills or night sweats.  He is tolerating his new systemic therapy with the oral Tagrisso quite well.  Overall, he is pleased with his progress to date.  ALLERGIES:  has No Known Allergies.  Meds: Current Outpatient Medications  Medication Sig Dispense Refill   cyanocobalamin (,VITAMIN B-12,) 1000 MCG/ML injection INJECT 1 ML (1,000 MCG TOTAL) INTO THE MUSCLE ONCE FOR 1 DOSE. 5 mL 1   cycloSPORINE (RESTASIS) 0.05 % ophthalmic emulsion Place 1 drop into both eyes 2 (two) times daily.     hydrochlorothiazide (MICROZIDE) 12.5 MG capsule TAKE 1 CAPSULE BY MOUTH EVERY DAY 90 capsule 3   HYDROcodone bit-homatropine (HYCODAN) 5-1.5 MG/5ML syrup Take 5 mLs by mouth every 6 (six) hours as needed for cough. 473 mL 0   hydrocortisone 1 % lotion Apply 1 application topically 2 (two) times daily. 113 g 0   lidocaine (XYLOCAINE) 2 % solution Use as directed 5 mLs in the mouth or throat every 3 (three) hours as needed for mouth pain. Gargle and spit 200 mL 2   lidocaine-prilocaine (EMLA) cream Apply to the Port-A-Cath site 30-60 minutes before treatment 30 g 0   LORazepam (ATIVAN) 1 MG tablet Take 1 tablet (1 mg total) by mouth as needed for anxiety (30 minutes prior to radiation treatment and may repeat just prior to procedure if needed.). 10 tablet 0   magic mouthwash SOLN Take 5 mLs by mouth 4 (four) times daily as needed for mouth pain. Swish and spit or swallow 240 mL 0   magnesium oxide (MAG-OX)  400 MG tablet Take 1 tablet (400 mg total) by mouth daily. 30 tablet 1   memantine (NAMENDA) 10 MG tablet Take 1 tablet (10 mg total) by mouth as directed. 5mg  qd x 1 week, then 5mg  BID x 1 week, then 10mg  am and 5mg  pm x 1 week and then 10mg  BID thereafter. 36 tablet 0   memantine (NAMENDA) 10 MG tablet TAKE 1 TABLET BY MOUTH TWICE A DAY 180 tablet 2   methylPREDNISolone (MEDROL DOSEPAK) 4 MG TBPK tablet Use as instructed 21 tablet 0   Multiple Vitamin (MULTIVITAMIN WITH MINERALS) TABS tablet Take 1 tablet by mouth daily.     omeprazole (PRILOSEC OTC) 20 MG tablet Take 20 mg by mouth daily.     osimertinib mesylate (TAGRISSO) 80 MG tablet Take 1 tablet (80 mg total) by mouth daily. 30 tablet 2   prochlorperazine (COMPAZINE) 10 MG tablet Take 1 tablet (10 mg total) by mouth every 6 (six) hours as needed for nausea or vomiting. 30 tablet 1   sildenafil (REVATIO) 20 MG tablet TAKE AS DIRECTED 10 tablet 11  sulfamethoxazole-trimethoprim (BACTRIM DS) 800-160 MG tablet Take 1 tablet by mouth 2 (two) times daily. 14 tablet 0   tamsulosin (FLOMAX) 0.4 MG CAPS capsule Take 0.4 mg by mouth daily.     No current facility-administered medications for this encounter.    Physical Findings: Unable to assess due to telephone follow-up visit format.  Lab Findings: Lab Results  Component Value Date   WBC 5.5 01/28/2021   HGB 12.8 (L) 01/28/2021   HCT 38.1 (L) 01/28/2021   MCV 92.5 01/28/2021   PLT 171 01/28/2021     Radiographic Findings: No results found.   Impression/Plan: 34. 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    He appears to be recovering well from the effects of his recent HBRT with HCA treatment and is currently without complaints aside from fatigue and decreased focus/memory capacity.  We discussed the plan for a repeat MRI brain scan in approximately 2 months to assess his treatment response I will plan to call  him with those results and recommendations from brain tumor board.  Pending this scan is stable, we will plan to resume serial brain MRI scans every 3 months to monitor for any disease progression or recurrence with a telephone follow-up after each scan.  He will also continue in routine follow-up under the care and direction of Dr. Earlie Server for continued management of his systemic disease.  He is scheduled for repeat systemic imaging on 02/27/2021 to assess his response to the new systemic therapy.  He is planning to complete the recommended 18-month course of Namenda as prescribed and is tolerating this well.  He is comfortable and in agreement with the stated plan and knows to call at anytime in the interim with any questions or concerns.       Nicholos Johns, PA-C

## 2021-02-05 NOTE — Progress Notes (Signed)
  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       Indication for treatment:  Palliation       Radiation treatment dates:   12/18/20 - 12/31/20  Site/dose:   The whole brain was treated to 30 Gy in 10 fractions of 3 Gy  Beams/energy:   Right and Left radiation fields were treated using 6 MV X-rays with custom MLC collimation to shield the eyes and face.  The patient was immobilized with a thermoplastic mask and isocenter was verified with weekly port films. Intensity Modulated Radiation Therapy (IMRT) plan was employed for the purpose of hippocampal avoidance to help reduce memory loss.    Narrative: The patient tolerated radiation treatment relatively well. He did experience modest fatigue and some generalized weakness but denied headaches, N/V, vision changes or focal weakness.  Plan: The patient has completed radiation treatment. The patient will return to radiation oncology clinic for routine followup in one month. I advised them to call or return sooner if they have any questions or concerns related to their recovery or treatment. ________________________________  Sheral Apley. Tammi Klippel, M.D.

## 2021-02-06 NOTE — Progress Notes (Signed)
Ethan [facility] OFFICE PROGRESS NOTE  Laurey Morale, MD [place] [address]  DIAGNOSIS: Stage IV (T2b, N2, M1c) presented with right middle lobe lung mass in addition to mediastinal lymphadenopathy as well as malignant right pleural effusion with pleural-based nodules and suspicious right hepatic lesion and the brain metastasis diagnosed in July 2020.   Molecular Biomarkers: [id number] (Exon 20 insertion) 0.2% Dacomitinib,Neratinib,Osimertinib   [id number] 0.1% None  PRIOR THERAPY: 1) SRS treatment to the metastatic disease to the brain under the care of Dr. Tammi Klippel. Completed on 04/07/2019. 2) SRS to the suspicious small brain lesion in the thalamocapsular region 01/23/2020 under the care of Dr. Tammi Klippel 3) Systemic chemotherapy with carboplatin for AUC of 5, Alimta 500 mg/M2 and Keytruda 200 mg IV every 3 weeks.  First dose April 04, 2019.  Status post 21 cycles  Starting from cycle #5 the patient is on maintenance treatment with Alimta and Keytruda every 3 weeks.  Starting from cycle #19 he will be on single agent Alimta.  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 4) SRS to the metastatic brain lesions under the care of Dr. Tammi Klippel on 07/03/20 5) whole brain irradiation for multiple metastatic brain lesions in May 5465 6)  Mobocertinib (Axkibity) 681 mg p.o. daily.  First dose was June 29, 2020.  Status post 6 months of treatment.  This was discontinued secondary to disease progression in the brain as well as the chest in May 2022  CURRENT THERAPY: Targeted therapy with Tagrisso 80 mg p.o. daily. First dose 01/13/21  INTERVAL HISTORY: Ethan Brown 70 y.o. male returns to the clinic today for a follow-up visit.  In early May 2022, the patient was found to have multiple metastatic brain lesions for which he completed whole brain radiation under the care of Dr. Tammi Klippel.  Due to the significant disease  progression in the brain, Dr. Julien Nordmann discontinued his targeted treatment with Mobocertinib.  Dr. Julien Nordmann had discussed with him several options for management of his condition including with infusion with Amivantamab versus systemic chemotherapy versus treatment with oral Tagrisso which also has some activity in exon 20 patients with the ability to close the blood brain barrier which may help with his brain metastasis. The patient opted to start on oral targeted treatment with Tagrisso. He started this on 01/13/21. He is tolerating this better than his last treatment. He notes resolution of diarrhea as well as improvement in the skin rash as well as the dry nasal mucosa.    Today, the patient denies any recent fever, chills, or night sweats. His weight and appetite are stable. He makes sure to eat 3 meals a day. His cough is the same as before despite the medrol Dosepak given at last visit. He has a prescription for hycodan for his cough although it helps somewhat. He describes hearing something in his lungs when he coughs and stated there is a "kitty" in his lungs that he can hear only sometimes. He reports his shortness of breath is slightly worse. He reports ongoing fatigue and hypersomnia for the past 3-4 weeks. He believes he has lost muscle mass. Despite his decreased energy levels, he reports forcing himself to stay active with some strength exercises as well as walking. His only reported pain is in his right hip which is worse with activity and upon hip flexion. He states he has had hip pain in the past although it had resolved for the past two years until recently.  He has started taking glucosamine and chondroitin supplements which he believes have helped with his pain in the past. Denies any nausea, vomiting, or diarrhea. Denies any constipation.  He denies recent headaches or visual changes. Denies any rashes or skin changes. He is here today for evaluation and repeat blood work.   MEDICAL  HISTORY: Past Medical History:  Diagnosis Date   Borderline hypertension    Brain metastasis ([place]) dx'd 01/2019   Detached vitreous humor, left    Dyspnea    ED (erectile dysfunction) of organic origin    Hyperlipidemia    Hypertension    Lens subluxation, left    Lung cancer ([place]) dx'd 01/2019   Stage IV   Migraine headache    takes PRN Imitrex   Pseudophakia    TGA (transient global amnesia) 2017    ALLERGIES:  has No Known Allergies.  MEDICATIONS:  Current Outpatient Medications  Medication Sig Dispense Refill   cyanocobalamin (,VITAMIN B-12,) 1000 MCG/ML injection INJECT 1 ML (1,000 MCG TOTAL) INTO THE MUSCLE ONCE FOR 1 DOSE. 5 mL 1   cycloSPORINE (RESTASIS) 0.05 % ophthalmic emulsion Place 1 drop into both eyes 2 (two) times daily.     hydrochlorothiazide (MICROZIDE) 12.5 MG capsule TAKE 1 CAPSULE BY MOUTH EVERY DAY 90 capsule 3   HYDROcodone bit-homatropine (HYCODAN) 5-1.5 MG/5ML syrup Take 5 mLs by mouth every 6 (six) hours as needed for cough. 473 mL 0   hydrocortisone 1 % lotion Apply 1 application topically 2 (two) times daily. 113 g 0   lidocaine (XYLOCAINE) 2 % solution Use as directed 5 mLs in the mouth or throat every 3 (three) hours as needed for mouth pain. Gargle and spit 200 mL 2   lidocaine-prilocaine (EMLA) cream Apply to the Port-A-Cath site 30-60 minutes before treatment 30 g 0   LORazepam (ATIVAN) 1 MG tablet Take 1 tablet (1 mg total) by mouth as needed for anxiety (30 minutes prior to radiation treatment and may repeat just prior to procedure if needed.). 10 tablet 0   magic mouthwash SOLN Take 5 mLs by mouth 4 (four) times daily as needed for mouth pain. Swish and spit or swallow 240 mL 0   magnesium oxide (MAG-OX) 400 MG tablet Take 1 tablet (400 mg total) by mouth daily. 30 tablet 1   memantine (NAMENDA) 10 MG tablet Take 1 tablet (10 mg total) by mouth as directed. 26m qd x 1 week, then 527mBID x 1 week, then 1072mm and 5mg47m x 1 week and then 10mg74m  thereafter. 36 tablet 0   memantine (NAMENDA) 10 MG tablet TAKE 1 TABLET BY MOUTH TWICE A DAY 180 tablet 2   methylPREDNISolone (MEDROL DOSEPAK) 4 MG TBPK tablet Use as instructed 21 tablet 0   Multiple Vitamin (MULTIVITAMIN WITH MINERALS) TABS tablet Take 1 tablet by mouth daily.     omeprazole (PRILOSEC OTC) 20 MG tablet Take 20 mg by mouth daily.     osimertinib mesylate (TAGRISSO) 80 MG tablet Take 1 tablet (80 mg total) by mouth daily. 30 tablet 2   prochlorperazine (COMPAZINE) 10 MG tablet Take 1 tablet (10 mg total) by mouth every 6 (six) hours as needed for nausea or vomiting. 30 tablet 1   sildenafil (REVATIO) 20 MG tablet TAKE AS DIRECTED 10 tablet 11   sulfamethoxazole-trimethoprim (BACTRIM DS) 800-160 MG tablet Take 1 tablet by mouth 2 (two) times daily. 14 tablet 0   tamsulosin (FLOMAX) 0.4 MG CAPS capsule Take 0.4 mg  by mouth daily.     No current facility-administered medications for this visit.    SURGICAL HISTORY:  Past Surgical History:  Procedure Laterality Date   CAROTID DOPPLERS  09/2017   Mild non-obstructive disease   CATARACT EXTRACTION, BILATERAL     cataract, left  2018   CHEST TUBE INSERTION Right 03/29/2019   Procedure: INSERTION PLEURAL DRAINAGE CATHETER;  Surgeon: [name], MD;  Location: [facility] OR;  Service: Thoracic;  Laterality: Right;   COLONOSCOPY  2016   clear, repeat in 10 yrs    Eye surgeries     , Lens attachment.  Vitrectomy   FEMORAL HERNIA REPAIR     HERNIA REPAIR     IR THORACENTESIS ASP PLEURAL SPACE W/IMG GUIDE  02/22/2019   IR THORACENTESIS ASP PLEURAL SPACE W/IMG GUIDE  03/13/2019   IR THORACENTESIS ASP PLEURAL SPACE W/IMG GUIDE  03/18/2020   LUMBAR LAMINECTOMY     PLEURAL BIOPSY Right 03/29/2019   Procedure: PLEURAL BIOPSY;  Surgeon: [name], MD;  Location: [place];  Service: Thoracic;  Laterality: Right;   PORTACATH PLACEMENT N/A 03/29/2019   Procedure: INSERTION PORT-A-CATH;  Surgeon: [name], MD;   Location: [place];  Service: Thoracic;  Laterality: N/A;   retinal attachment, right     sclearl buckle, right     TRANSTHORACIC ECHOCARDIOGRAM  12/14/2018   EF 50 EF 55% (low normal).  Abnormal septal wall motion related to BBB; indeterminate diastolic parameters.  Mild RV enlargement with mildly reduced function.  Normal valves.  Normal atrial sizes.   victrectomy,right     VIDEO ASSISTED THORACOSCOPY Right 03/29/2019   Procedure: VIDEO ASSISTED THORACOSCOPY;  Surgeon: [name], MD;  Location: [facility] OR;  Service: Thoracic;  Laterality: Right;    REVIEW OF SYSTEMS:   Review of Systems  Constitutional: Positive fatigue. Negative for appetite change, chills, fever and unexpected weight change.  HENT: Negative for mouth sores, nosebleeds, sore throat and trouble swallowing.   Eyes: Negative for eye problems and icterus.  Respiratory: Positive for cough and dyspnea on exertion. Negative for hemoptysis and wheezing.   Cardiovascular: Negative for chest pain and leg swelling.  Gastrointestinal: Negative for abdominal pain, constipation, diarrhea, nausea and vomiting.  Genitourinary: Negative for bladder incontinence, difficulty urinating, dysuria, frequency and hematuria.   Musculoskeletal: Positive right hip pain. Negative for back pain, gait problem, neck pain and neck stiffness.  Skin: Negative for itching and rash.  Neurological: Negative for dizziness, extremity weakness, gait problem, headaches, light-headedness and seizures.  Hematological: Negative for adenopathy. Does not bruise/bleed easily.  Psychiatric/Behavioral: Negative for confusion, depression and sleep disturbance. The patient is not nervous/anxious.     PHYSICAL EXAMINATION:  Blood pressure 117/76, pulse 80, temperature 97.8 F (36.6 C), temperature source Tympanic, resp. rate 17, height _0  (1.803 m), weight 186 lb 12.8 oz (84.7 kg), SpO2 99 %.  ECOG PERFORMANCE STATUS: 1  Physical Exam  Constitutional:  Oriented to person, place, and time and well-developed, well-nourished, and in no distress. HENT: Head: Normocephalic and atraumatic. Mouth/Throat: Oropharynx is clear and moist. No oropharyngeal exudate. Eyes: Conjunctivae are normal. Right eye exhibits no discharge. Left eye exhibits no discharge. No scleral icterus. Neck: Normal range of motion. Neck supple. Cardiovascular: Normal rate, regular rhythm, normal heart sounds and intact distal pulses.   Pulmonary/Chest: Effort normal. Decreased breath sounds in the right lung. No respiratory distress. No wheezes. No rales.  Abdominal: Soft. Bowel sounds are normal. Exhibits no distension and no mass.  There is no tenderness.  Musculoskeletal: Normal range of motion. Exhibits no edema.  Lymphadenopathy:    No cervical adenopathy.  Neurological: Alert and oriented to person, place, and time. Exhibits normal muscle tone. Gait normal. Coordination normal. Skin: Skin is warm and dry. No rash noted. Not diaphoretic. No erythema. No pallor.  Psychiatric: Mood, memory and judgment normal. Vitals reviewed.  LABORATORY DATA: Lab Results  Component Value Date   WBC 4.6 02/11/2021   HGB 12.2 (L) 02/11/2021   HCT 36.8 (L) 02/11/2021   MCV 92.7 02/11/2021   PLT 147 (L) 02/11/2021      Chemistry      Component Value Date/Time   NA 136 02/11/2021 1423   K 3.4 (L) 02/11/2021 1423   CL 101 02/11/2021 1423   CO2 28 02/11/2021 1423   BUN 20 02/11/2021 1423   CREATININE 1.05 02/11/2021 1423      Component Value Date/Time   CALCIUM 8.7 (L) 02/11/2021 1423   ALKPHOS 130 (H) 02/11/2021 1423   AST 17 02/11/2021 1423   ALT 12 02/11/2021 1423   BILITOT 0.3 02/11/2021 1423       RADIOGRAPHIC STUDIES:  No results found.   ASSESSMENT/PLAN:  This is a very pleasant 70 year old Caucasian male who was diagnosed with stage IV non-small cell lung cancer, adenocarcinoma with a positive EGFR resistant mutation in exon 20.  He presented with extensive  disease involving the right upper lobe, right middle lobe, and right lower lobe with extensive pleural-based metastases.  He also has mediastinal and hilar adenopathy and a right malignant pleural effusion.  He also has metastatic disease to the brain and a suspicious liver lesion.  He completed SRS treatment to the metastatic disease to the brain under care of Dr. Tammi Klippel.   He had a pleurex catheter placed under the care of Dr. Roxan Hockey for his malignant pleural effusion. This was removed 07/11/2019   He then had [place] treatment to the another suspicious brain lesion on 01/23/20 under the care of Dr. Tammi Klippel as well as in November 2021.  He previously underwent systsystemic chemotherapy with carboplatin AUC of 5, Alimta 500 mg/m, and Keytruda 200 mg IV every 3 weeks.  He is status post 20 cycles. Starting from cycle #5, he had been on maintenance Alimta 500 mg/m2 and Keytruda 200 mg IV every 3 weeks. Beryle Flock was discontinued due to pneumonitis starting from cycle #17. This was discontinued after cycle #21 due to evidence for disease progression.   He had repeat PET scan and MRI of the brain performed.  The PET scan showed evidence for disease progression with metastatic disease involving the thoracic and abdominal lymph nodes as well as the right hemithorax and bones.  He also has loculated moderate right pleural effusion and small left pleural effusion.  The PET scan also showed prostate enlargement and his PSA has been rising recently.  He was seen by urology and is being followed.  He then underwent treatment with the new targeted agent Mobocertinib (Axkibity) that was recently approved by the FDA for patient with EGFR exon 20 mutation. He started this on 06/29/20. This was discontinued in May 2022 after the development of numerous new metastatic brain lesions.    He completed whole brain radiation under the care of Dr. Tammi Klippel in May 2022.   He is currently undergoing oral targeted treatment  with Tagrisso 80 mg p.o. daily. His first dose was on 01/13/21.    Labs were reviewed. Recommend that he proceed with the  same treatment at the same dose.  The patient is scheduled for a restaging CT scan of the chest, abdomen, and pelvis prior to his next visit. His scan is scheduled for 02/27/21. We will be able to evaluate his hip discomfort on this imaging as well.   We will see him back for follow-up visit in 2 weeks for evaluation and to review his scan results  He will continue to follow with Urology for his prostate cancer.    He will continue taking hycodan for his cough. We will also send a prescription for a medrol dosepak.   The patient was advised to call immediately if he has any concerning symptoms in the interval. The patient voices understanding of current disease status and treatment options and is in agreement with the current care plan. All questions were answered. The patient knows to call the clinic with any problems, questions or concerns. We can certainly see the patient much sooner if necessary             No orders of the defined types were placed in this encounter.    The total time spent in the appointment was 20-29 minutes in this encounter.   Cricket Goodlin L Donte Kary, PA-C 02/11/21

## 2021-02-11 ENCOUNTER — Inpatient Hospital Stay: Payer: Medicare Other

## 2021-02-11 ENCOUNTER — Other Ambulatory Visit: Payer: Self-pay

## 2021-02-11 ENCOUNTER — Inpatient Hospital Stay (HOSPITAL_BASED_OUTPATIENT_CLINIC_OR_DEPARTMENT_OTHER): Payer: Medicare Other | Admitting: Physician Assistant

## 2021-02-11 VITALS — BP 117/76 | HR 80 | Temp 97.8°F | Resp 17 | Ht 71.0 in | Wt 186.8 lb

## 2021-02-11 DIAGNOSIS — C7931 Secondary malignant neoplasm of brain: Secondary | ICD-10-CM | POA: Diagnosis not present

## 2021-02-11 DIAGNOSIS — C3491 Malignant neoplasm of unspecified part of right bronchus or lung: Secondary | ICD-10-CM

## 2021-02-11 DIAGNOSIS — C7951 Secondary malignant neoplasm of bone: Secondary | ICD-10-CM | POA: Diagnosis not present

## 2021-02-11 DIAGNOSIS — C7989 Secondary malignant neoplasm of other specified sites: Secondary | ICD-10-CM | POA: Diagnosis not present

## 2021-02-11 DIAGNOSIS — C787 Secondary malignant neoplasm of liver and intrahepatic bile duct: Secondary | ICD-10-CM | POA: Diagnosis not present

## 2021-02-11 DIAGNOSIS — C342 Malignant neoplasm of middle lobe, bronchus or lung: Secondary | ICD-10-CM | POA: Diagnosis not present

## 2021-02-11 DIAGNOSIS — C61 Malignant neoplasm of prostate: Secondary | ICD-10-CM | POA: Diagnosis not present

## 2021-02-11 LAB — CBC WITH DIFFERENTIAL (CANCER CENTER ONLY)
Abs Immature Granulocytes: 0.01 10*3/uL (ref 0.00–0.07)
Basophils Absolute: 0 10*3/uL (ref 0.0–0.1)
Basophils Relative: 0 %
Eosinophils Absolute: 0.1 10*3/uL (ref 0.0–0.5)
Eosinophils Relative: 1 %
HCT: 36.8 % — ABNORMAL LOW (ref 39.0–52.0)
Hemoglobin: 12.2 g/dL — ABNORMAL LOW (ref 13.0–17.0)
Immature Granulocytes: 0 %
Lymphocytes Relative: 12 %
Lymphs Abs: 0.6 10*3/uL — ABNORMAL LOW (ref 0.7–4.0)
MCH: 30.7 pg (ref 26.0–34.0)
MCHC: 33.2 g/dL (ref 30.0–36.0)
MCV: 92.7 fL (ref 80.0–100.0)
Monocytes Absolute: 0.3 10*3/uL (ref 0.1–1.0)
Monocytes Relative: 7 %
Neutro Abs: 3.6 10*3/uL (ref 1.7–7.7)
Neutrophils Relative %: 80 %
Platelet Count: 147 10*3/uL — ABNORMAL LOW (ref 150–400)
RBC: 3.97 MIL/uL — ABNORMAL LOW (ref 4.22–5.81)
RDW: 13.5 % (ref 11.5–15.5)
WBC Count: 4.6 10*3/uL (ref 4.0–10.5)
nRBC: 0 % (ref 0.0–0.2)

## 2021-02-11 LAB — CMP (CANCER CENTER ONLY)
ALT: 12 U/L (ref 0–44)
AST: 17 U/L (ref 15–41)
Albumin: 2.7 g/dL — ABNORMAL LOW (ref 3.5–5.0)
Alkaline Phosphatase: 130 U/L — ABNORMAL HIGH (ref 38–126)
Anion gap: 7 (ref 5–15)
BUN: 20 mg/dL (ref 8–23)
CO2: 28 mmol/L (ref 22–32)
Calcium: 8.7 mg/dL — ABNORMAL LOW (ref 8.9–10.3)
Chloride: 101 mmol/L (ref 98–111)
Creatinine: 1.05 mg/dL (ref 0.61–1.24)
GFR, Estimated: >60 mL/min (ref >60)
Glucose, Bld: 95 mg/dL (ref 70–99)
Potassium: 3.4 mmol/L — ABNORMAL LOW (ref 3.5–5.1)
Sodium: 136 mmol/L (ref 135–145)
Total Bilirubin: 0.3 mg/dL (ref 0.3–1.2)
Total Protein: 5.9 g/dL — ABNORMAL LOW (ref 6.5–8.1)

## 2021-02-12 ENCOUNTER — Telehealth: Payer: Self-pay | Admitting: Physician Assistant

## 2021-02-12 NOTE — Telephone Encounter (Signed)
Scheduled per los. Called and left msg. Mailed printotu

## 2021-02-20 ENCOUNTER — Encounter: Payer: Self-pay | Admitting: Urology

## 2021-02-23 ENCOUNTER — Encounter: Payer: Self-pay | Admitting: Cardiology

## 2021-02-23 NOTE — Progress Notes (Signed)
Primary Care Provider: Laurey Morale, MD Cardiologist: None Electrophysiologist: None  Clinic Note: No chief complaint on file.  ===================================  ASSESSMENT/PLAN   Problem List Items Addressed This Visit     Borderline hypertension (Chronic)   Bifascicular bundle branch block - Primary (Chronic)   Bilateral lower extremity edema (Chronic)   Shortness of breath (Chronic)    ===================================  HPI:    Ethan Brown is a 70 y.o. male with a PMH be notable for h/o TGA, HLD, ~ HTN, BLE Edema (related to lymphedema/hypoalbuminemia), RBBB, & Stage IV Lung CA (with brain mets) who presents today for 98-month follow-up  Ethan Brown was last seen on August 26, 2020.  He was in the process of undergoing treatment for brain metastasis (XRT) along with oral chemotherapy.  Was starting to feel better from a cancer standpoint.  Edema was doing pretty well.  Albumin levels are improving as was his nutrition.  With HCTZ.  Therapy is being done and he was still wearing support hose.  Exertional dyspnea notably improved to the point where he was able to walk about three quarters of a mile uphill and then back (over 30 minutes) each day energy level notably improved.  Stable if not improved exertional dyspnea and edema.  No chest pain or CHF symptoms. -> Energy level is notably improved on "new medicine ".  Sleeping better and eating better.  Was coming to grips with the thought of being on lifelong chemotherapy. -> Rare palpitations  Recent Hospitalizations:  XRT Rx  On 4th Regimen  Reviewed  CV studies:    The following studies were reviewed today: (if available, images/films reviewed: From Epic Chart or Care Everywhere) none:   Interval History:   Ethan Brown returns for 8-month follow-up somewhat subdued.  He is now on his fourth treatment option for lung cancer including XRT for brain mets.  He says it with this  treatment he is very tired all the time.  Extremely fatigued and starting extra time lots of wear and tear.  He is down about 18 pounds with generalized fatigue.  He really does not do much walking now because he is got a lot of right-sided hip pain making his gait somewhat unstable.  He is always short of breath and probably is getting progressively worse.  Overall breathing has become harder.  The most physical activity that he does is riding his.  In doing so, he denies any chest pain indoor bike for about 20 minutes or so.  About the chest pain he has is when he coughs.  The chest pain is in the upper left and right side of the chest, not centralized.  It is worse with cough.  Is mild orthopnea but no real PND.  Fairly well-controlled edema with HCTZ.  But he does get dizzy and lightheaded sometimes.  No syncope or near syncope.  No palpitations   REVIEWED OF SYSTEMS   Pertinent positives:  Dizzy - unsteady; esp getting up to [place] or turning too quickly R hip pain - limits walking> SOB - unsteady gait. Chronic cough and dyspnea on exertion - improved. He is starting to realize now that he is coming to the end of his life with metastatic lung cancer and knows that there are very few treatment options left.  He is actually now at peace with this, and does not want to do too much aggressive management going forward..  I have reviewed and (if needed) personally updated the  patient's problem list, medications, allergies, past medical and surgical history, social and family history.   PAST MEDICAL HISTORY   Past Medical History:  Diagnosis Date   Borderline hypertension    Brain metastasis ([place]) dx'd 01/2019   Detached vitreous humor, left    Dyspnea    ED (erectile dysfunction) of organic origin    Hyperlipidemia    Hypertension    Lens subluxation, left    Lung cancer ([place]) dx'd 01/2019   Stage IV   Migraine headache    takes PRN Imitrex   Pseudophakia    TGA (transient global  amnesia) 2017    PAST SURGICAL HISTORY   Past Surgical History:  Procedure Laterality Date   CAROTID DOPPLERS  09/2017   Mild non-obstructive disease   CATARACT EXTRACTION, BILATERAL     cataract, left  2018   CHEST TUBE INSERTION Right 03/29/2019   Procedure: INSERTION PLEURAL DRAINAGE CATHETER;  Surgeon: [name], MD;  Location: [facility];  Service: Thoracic;  Laterality: Right;   COLONOSCOPY  2016   clear, repeat in 10 yrs    Eye surgeries     , Lens attachment.  Vitrectomy   FEMORAL HERNIA REPAIR     HERNIA REPAIR     IR THORACENTESIS ASP PLEURAL SPACE W/IMG GUIDE  02/22/2019   IR THORACENTESIS ASP PLEURAL SPACE W/IMG GUIDE  03/13/2019   IR THORACENTESIS ASP PLEURAL SPACE W/IMG GUIDE  03/18/2020   LUMBAR LAMINECTOMY     PLEURAL BIOPSY Right 03/29/2019   Procedure: PLEURAL BIOPSY;  Surgeon: [name], MD;  Location: [facility];  Service: Thoracic;  Laterality: Right;   PORTACATH PLACEMENT N/A 03/29/2019   Procedure: INSERTION PORT-A-CATH;  Surgeon: [name], MD;  Location: [facility];  Service: Thoracic;  Laterality: N/A;   retinal attachment, right     sclearl buckle, right     TRANSTHORACIC ECHOCARDIOGRAM  12/14/2019   EF 50 EF 55% (low normal).  Abnormal septal wall motion related to BBB; indeterminate diastolic parameters.  Mild RV enlargement with mildly reduced function.  Normal valves.  Normal atrial sizes.   victrectomy,right     VIDEO ASSISTED THORACOSCOPY Right 03/29/2019   Procedure: VIDEO ASSISTED THORACOSCOPY;  Surgeon: [name], MD;  Location[facility];  Service: Thoracic;  Laterality: Right;    Immunization History  Administered Date(s) Administered   Fluad Quad(high Dose 65+) 05/04/2019   Influenza, High Dose Seasonal PF 05/21/2018   PFIZER(Purple Top)SARS-COV-2 Vaccination 09/29/2019, 10/24/2019    MEDICATIONS/ALLERGIES   Current Meds  Medication Sig   cyanocobalamin (,VITAMIN B-12,) 1000 MCG/ML injection INJECT 1 ML  (1,000 MCG TOTAL) INTO THE MUSCLE ONCE FOR 1 DOSE.   cycloSPORINE (RESTASIS) 0.05 % ophthalmic emulsion Place 1 drop into both eyes 2 (two) times daily.   hydrochlorothiazide (MICROZIDE) 12.5 MG capsule TAKE 1 CAPSULE BY MOUTH EVERY DAY   hydrocortisone 1 % lotion Apply 1 application topically 2 (two) times daily.   lidocaine (XYLOCAINE) 2 % solution Use as directed 5 mLs in the mouth or throat every 3 (three) hours as needed for mouth pain. Gargle and spit   lidocaine-prilocaine (EMLA) cream Apply to the Port-A-Cath site 30-60 minutes before treatment   LORazepam (ATIVAN) 1 MG tablet Take 1 tablet (1 mg total) by mouth as needed for anxiety (30 minutes prior to radiation treatment and may repeat just prior to procedure if needed.).   magic mouthwash SOLN Take 5 mLs by mouth 4 (four) times daily as needed for mouth  pain. Swish and spit or swallow   memantine (NAMENDA) 10 MG tablet TAKE 1 TABLET BY MOUTH TWICE A DAY   Multiple Vitamin (MULTIVITAMIN WITH MINERALS) TABS tablet Take 1 tablet by mouth daily.   omeprazole (PRILOSEC OTC) 20 MG tablet Take 20 mg by mouth daily.   osimertinib mesylate (TAGRISSO) 80 MG tablet Take 1 tablet (80 mg total) by mouth daily.   tamsulosin (FLOMAX) 0.4 MG CAPS capsule Take 0.4 mg by mouth daily.   [DISCONTINUED] HYDROcodone bit-homatropine (HYCODAN) 5-1.5 MG/5ML syrup Take 5 mLs by mouth every 6 (six) hours as needed for cough.    No Known Allergies  SOCIAL HISTORY/FAMILY HISTORY   Reviewed in Epic:  Pertinent findings:  Social History   Tobacco Use   Smoking status: Never   Smokeless tobacco: Never  Vaping Use   Vaping Use: Never used  Substance Use Topics   Alcohol use: Yes    Alcohol/week: 1.0 standard drink    Types: 1 Glasses of wine per week   Drug use: No   Social History   Social History Narrative   Married father of 3, grandfather 23.  Lives with his wife, [name] they recently moved to [place] after living in [place].       He is a retired Engineer, maintenance (IT)   Was previously on an exercise regimen at [employer] 3 days a week for 90 minutes at a time --> he is now hoping to get back into that plan, but is currently now riding his bike at least 30 to 40 minutes a day for 5 days a week and then doing longer walks about 6 days a week.    OBJCTIVE -PE, EKG, labs   Wt Readings from Last 3 Encounters:  02/25/21 180 lb (81.6 kg)  02/11/21 186 lb 12.8 oz (84.7 kg)  01/28/21 185 lb 11.2 oz (84.2 kg)    Physical Exam: BP 120/78 (BP Location: Right Arm, Patient Position: Sitting, Cuff Size: Normal)   Pulse 91   Ht 5\' 11"  (1.803 m)   Wt 180 lb (81.6 kg)   SpO2 98%   BMI 25.10 kg/m  Physical Exam Vitals reviewed.  Constitutional:      General: He is not in acute distress.    Appearance: He is ill-appearing. He is not toxic-appearing or diaphoretic.     Comments: Notably more thin and frail.  There is normal full head of hair and beard that are now present.  He seems more weak and fatigued.  HENT:     Head: Normocephalic and atraumatic.  Neck:     Vascular: No carotid bruit, hepatojugular reflux or JVD.  Cardiovascular:     Rate and Rhythm: Normal rate and regular rhythm. Occasional Extrasystoles are present.    Chest Wall: PMI is not displaced.     Pulses: Normal pulses.     Heart sounds: S1 normal. Heart sounds are distant. No murmur heard.   No friction rub. No gallop.     Comments: Split S2 Pulmonary:     Effort: Pulmonary effort is normal. No respiratory distress.     Breath sounds: Rhonchi present.     Comments: Diffuse rhonchorous sounds in the upper airways and crackles/not rales in the bilateral bases. Chest:     Chest wall: Tenderness present.  Musculoskeletal:        General: Swelling (Trivial bilateral LE) present.     Cervical back: Normal range of motion and neck supple.  Skin:    General: Skin is  warm and dry.  Neurological:     General: No focal deficit present.     Mental Status: He is alert and  oriented to person, place, and time.  Psychiatric:        Mood and Affect: Mood normal.        Behavior: Behavior normal.        Thought Content: Thought content normal.        Judgment: Judgment normal.     Adult ECG Report EKG from May 12 reviewed.  Sinus rhythm with PACs.  RBBB did LAFB (-70  )  Recent Labs:   Reviewed.  No new labs Lab Results  Component Value Date   CHOL 169 08/30/2018   HDL 41.40 08/30/2018   LDLCALC 104 (H) 08/30/2018   TRIG 115.0 08/30/2018   CHOLHDL 4 08/30/2018   Lab Results  Component Value Date   CREATININE 1.05 02/11/2021   BUN 20 02/11/2021   NA 136 02/11/2021   K 3.4 (L) 02/11/2021   CL 101 02/11/2021   CO2 28 02/11/2021   CBC Latest Ref Rng & Units 02/11/2021 01/28/2021 01/02/2021  WBC 4.0 - 10.5 K/uL 4.6 5.5 5.0  Hemoglobin 13.0 - 17.0 g/dL 12.2(L) 12.8(L) 12.6(L)  Hematocrit 39.0 - 52.0 % 36.8(L) 38.1(L) 37.5(L)  Platelets 150 - 400 K/uL 147(L) 171 177    Lab Results  Component Value Date   TSH 0.786 07/29/2020    ==================================================  COVID-19 Education: The signs and symptoms of COVID-19 were discussed with the patient and how to seek care for testing (follow up with PCP or arrange E-visit).    I spent a total of 42minutes with the patient spent in direct patient consultation.  Additional time spent with chart review  / charting (studies, outside notes, etc): 15 min -> brief review of his cancer treatments clinic notes etc. Total Time: 32 min  Current medicines are reviewed at length with the patient today.  (+/- concerns) N/A  This visit occurred during the SARS-CoV-2 public health emergency.  Safety protocols were in place, including screening questions prior to the visit, additional usage of staff PPE, and extensive cleaning of exam room while observing appropriate contact time as indicated for disinfecting solutions.  Notice: This dictation was prepared with Dragon dictation along with smaller  phrase technology. Any transcriptional errors that result from this process are unintentional and may not be corrected upon review.  Patient Instructions / Medication Changes & Studies & Tests Ordered   Patient Instructions  Medication Instructions:   Dr Ellyn Hack recommend No changes with medication except you can take HCTZ ( hydrochlorothiazide) every other day or as needed. You do not need to take it everyday  *If you need a refill on your cardiac medications before your next appointment, please call your pharmacy*   Lab Work:  Not needed   Testing/Procedures:  Not needed  Follow-Up: At [facility], you and your health needs are our priority.  As part of our continuing mission to provide you with exceptional heart care, we have created designated Provider Care Teams.  These Care Teams include your primary Cardiologist (physician) and Advanced Practice Providers (APPs -  Physician Assistants and Nurse Practitioners) who all work together to provide you with the care you need, when you need it.     Your next appointment:   6 month(s)  The format for your next appointment:   In Person  Provider:   Glenetta Hew, MD     Studies Ordered:  No orders of the defined types were placed in this encounter.    Glenetta Hew, M.D., M.S. Interventional Cardiologist   Pager # [phone] Phone # [phone] [address] [place], [place] [address]   Thank you for choosing Heartcare at [facility]!!

## 2021-02-25 ENCOUNTER — Other Ambulatory Visit: Payer: Self-pay | Admitting: Physician Assistant

## 2021-02-25 ENCOUNTER — Encounter: Payer: Self-pay | Admitting: Cardiology

## 2021-02-25 ENCOUNTER — Telehealth: Payer: Self-pay | Admitting: Physician Assistant

## 2021-02-25 ENCOUNTER — Ambulatory Visit (INDEPENDENT_AMBULATORY_CARE_PROVIDER_SITE_OTHER): Payer: Medicare Other | Admitting: Cardiology

## 2021-02-25 ENCOUNTER — Other Ambulatory Visit: Payer: Self-pay

## 2021-02-25 VITALS — BP 120/78 | HR 91 | Ht 71.0 in | Wt 180.0 lb

## 2021-02-25 DIAGNOSIS — R600 Localized edema: Secondary | ICD-10-CM

## 2021-02-25 DIAGNOSIS — I452 Bifascicular block: Secondary | ICD-10-CM

## 2021-02-25 DIAGNOSIS — R030 Elevated blood-pressure reading, without diagnosis of hypertension: Secondary | ICD-10-CM

## 2021-02-25 DIAGNOSIS — C3491 Malignant neoplasm of unspecified part of right bronchus or lung: Secondary | ICD-10-CM

## 2021-02-25 DIAGNOSIS — R0602 Shortness of breath: Secondary | ICD-10-CM

## 2021-02-25 DIAGNOSIS — R059 Cough, unspecified: Secondary | ICD-10-CM

## 2021-02-25 MED ORDER — HYDROCODONE BIT-HOMATROP MBR 5-1.5 MG/5ML PO SOLN
5.0000 mL | Freq: Four times a day (QID) | ORAL | 0 refills | Status: DC | PRN
Start: 2021-02-25 — End: 2021-02-27

## 2021-02-25 MED ORDER — MIRTAZAPINE 15 MG PO TABS: 15.0000 mg | ORAL_TABLET | Freq: Every day | ORAL | 2 refills | Status: DC

## 2021-02-25 NOTE — Telephone Encounter (Signed)
I received a message regarding his decreased appetite. I spoke to Dr. Julien Nordmann. I called the patient and reviewed the risks and benefits of 3 options. I discussed a medrol dospak vs. Remeron vs. Megace. The patient is not interested in megace due to the risk of blood clots. Discussed the medrol dospak is more of a short term prescription. Remeron would be a long term prescription but may take some time to work. After discussion with the patient, we decided to move forward with remeron. I also have refilled his hycodan.

## 2021-02-25 NOTE — Patient Instructions (Signed)
Medication Instructions:   Dr Ellyn Hack recommend No changes with medication except you can take HCTZ ( hydrochlorothiazide) every other day or as needed. You do not need to take it everyday  *If you need a refill on your cardiac medications before your next appointment, please call your pharmacy*   Lab Work:  Not needed   Testing/Procedures:  Not needed  Follow-Up: At [facility], you and your health needs are our priority.  As part of our continuing mission to provide you with exceptional heart care, we have created designated Provider Care Teams.  These Care Teams include your primary Cardiologist (physician) and Advanced Practice Providers (APPs -  Physician Assistants and Nurse Practitioners) who all work together to provide you with the care you need, when you need it.     Your next appointment:   6 month(s)  The format for your next appointment:   In Person  Provider:   Glenetta Hew, MD

## 2021-02-27 ENCOUNTER — Encounter (HOSPITAL_COMMUNITY): Payer: Self-pay

## 2021-02-27 ENCOUNTER — Other Ambulatory Visit: Payer: Self-pay | Admitting: Physician Assistant

## 2021-02-27 ENCOUNTER — Other Ambulatory Visit: Payer: Self-pay

## 2021-02-27 ENCOUNTER — Ambulatory Visit (HOSPITAL_COMMUNITY)
Admission: RE | Admit: 2021-02-27 | Discharge: 2021-02-27 | Disposition: A | Discharge status: Home / Self Care | Payer: Medicare Other | Source: Ambulatory Visit | Attending: Physician Assistant | Admitting: Physician Assistant

## 2021-02-27 ENCOUNTER — Other Ambulatory Visit: Payer: Self-pay | Admitting: Internal Medicine

## 2021-02-27 DIAGNOSIS — C3491 Malignant neoplasm of unspecified part of right bronchus or lung: Secondary | ICD-10-CM | POA: Insufficient documentation

## 2021-02-27 DIAGNOSIS — J90 Pleural effusion, not elsewhere classified: Secondary | ICD-10-CM | POA: Diagnosis not present

## 2021-02-27 DIAGNOSIS — C7951 Secondary malignant neoplasm of bone: Secondary | ICD-10-CM | POA: Diagnosis not present

## 2021-02-27 DIAGNOSIS — R059 Cough, unspecified: Secondary | ICD-10-CM

## 2021-02-27 DIAGNOSIS — N210 Calculus in bladder: Secondary | ICD-10-CM | POA: Diagnosis not present

## 2021-02-27 DIAGNOSIS — I700 Atherosclerosis of aorta: Secondary | ICD-10-CM | POA: Diagnosis not present

## 2021-02-27 MED ORDER — HYDROCODONE BIT-HOMATROP MBR 5-1.5 MG/5ML PO SOLN: 5.0000 mL | Freq: Four times a day (QID) | ORAL | 0 refills | Status: DC | PRN

## 2021-02-27 MED ORDER — SODIUM CHLORIDE (PF) 0.9 % IJ SOLN
INTRAMUSCULAR | Status: AC
  Filled 2021-02-27: qty 50

## 2021-02-27 MED ORDER — IOHEXOL 300 MG/ML  SOLN
100.0000 mL | Freq: Once | INTRAMUSCULAR | Status: AC | PRN
  Administered 2021-02-27: 100 mL via INTRAVENOUS

## 2021-02-27 NOTE — Progress Notes (Signed)
Ethan Brown  Ethan Morale, MD Ethan Brown  DIAGNOSIS: Stage IV (T2b, N2, M1c) presented with right middle lobe lung mass in addition to mediastinal lymphadenopathy as well as malignant right pleural effusion with pleural-based nodules and suspicious right hepatic lesion and the brain metastasis diagnosed in July 2020.   Molecular Biomarkers: [id number] (Exon 20 insertion) 0.2% Dacomitinib,Neratinib,Osimertinib   [id number] 0.1% None  PRIOR THERAPY: 1) SRS treatment to the metastatic disease to the brain under the care of Dr. Tammi Klippel. Completed on 04/07/2019. 2) SRS to the suspicious small brain lesion in the thalamocapsular region 01/23/2020 under the care of Dr. Tammi Klippel 3) Systemic chemotherapy with carboplatin for AUC of 5, Alimta 500 mg/M2 and Keytruda 200 mg IV every 3 weeks.  First dose April 04, 2019.  Status post 21 cycles  Starting from cycle #5 the patient is on maintenance treatment with Alimta and Keytruda every 3 weeks.  Starting from cycle #19 he will be on single agent Alimta.  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 4) SRS to the metastatic brain lesions under the care of Dr. Tammi Klippel on 07/03/20 5) whole brain irradiation for multiple metastatic brain lesions in May 3220 6)  Mobocertinib (Axkibity) 254 mg p.o. daily.  First dose was June 29, 2020.  Status post 6 months of treatment.  This was discontinued secondary to disease progression in the brain as well as the chest in May 2022  CURRENT THERAPY:  Targeted therapy with Tagrisso 80 mg p.o. daily. First dose 01/13/21. Status post 7 weeks of treatment.   INTERVAL HISTORY: Ethan Brown 70 y.o. male returns to the clinic today for a follow-up visit accompanied by his wife.  In early May 2022, the patient was found to have multiple metastatic brain lesions for which he completed whole brain radiation  under the care of Dr. Tammi Klippel.  Due to the significant disease progression in the brain, Dr. Julien Nordmann discontinued his targeted treatment with Mobocertinib.  Dr. Julien Nordmann had discussed with him several options for management of his condition including with infusion with Amivantamab versus systemic chemotherapy versus treatment with oral Tagrisso which also has some activity in exon 20 patients with the ability to close the blood brain barrier which may help with his brain metastasis. The patient opted to start on oral targeted treatment with Tagrisso. He started this on 01/13/21. He is tolerating this better than his last treatment. He notes resolution of diarrhea as well as improvement in the skin rash as well as the dry nasal mucosa.   The patient's main concern is related to increased fatigue the last few weeks. He has been sleeping more. He also reports decreased appetite and insomnia. He was started on remeron a few days ago. He gained 4 lbs since his last appointment.   He continues to have a significant dry cough which is troublesome. He take hycodan which helps but he still has a significant cough. He was given a medrol dosepak which did not help. He also reports stable to worsening dyspnea on exertion.   Today, the patient denies any recent fever, chills, or night sweats.He describes hearing something in his lungs when he coughs and stated there is a "kitty" in his lungs that he can hear only sometimes. His only reported pain is in his right hip which is worse with activity and upon hip flexion. He states he has had hip pain in the past although it had  resolved for the past two years until recently. He denies instability but has been walking with a cane just as a precaution. Denies any nausea, vomiting, or diarrhea. Denies any constipation.  He denies recent headaches or visual changes except headaches with taking hycodan which resolves with tylenol. Denies any rashes or skin changes. He recently had a  restaging CT scan performed. The patient is here today for evaluation and to review his scan results.     MEDICAL HISTORY: Past Medical History:  Diagnosis Date   Borderline hypertension    Brain metastasis (HCC) dx'd 01/2019   Detached vitreous humor, left    Dyspnea    ED (erectile dysfunction) of organic origin    Hyperlipidemia    Hypertension    Lens subluxation, left    Lung cancer (HCC) dx'd 01/2019   Stage IV   Migraine headache    takes PRN Imitrex   Pseudophakia    TGA (transient global amnesia) 2017    ALLERGIES:  has No Known Allergies.  MEDICATIONS:  Current Outpatient Medications  Medication Sig Dispense Refill   cyanocobalamin (,VITAMIN B-12,) 1000 MCG/ML injection INJECT 1 ML (1,000 MCG TOTAL) INTO THE MUSCLE ONCE FOR 1 DOSE. 5 mL 1   cycloSPORINE (RESTASIS) 0.05 % ophthalmic emulsion Place 1 drop into both eyes 2 (two) times daily.     hydrochlorothiazide (MICROZIDE) 12.5 MG capsule TAKE 1 CAPSULE BY MOUTH EVERY DAY 90 capsule 3   HYDROcodone bit-homatropine (HYCODAN) 5-1.5 MG/5ML syrup Take 5 mLs by mouth every 6 (six) hours as needed for cough. 473 mL 0   hydrocortisone 1 % lotion Apply 1 application topically 2 (two) times daily. 113 g 0   lidocaine (XYLOCAINE) 2 % solution Use as directed 5 mLs in the mouth or throat every 3 (three) hours as needed for mouth pain. Gargle and spit 200 mL 2   lidocaine-prilocaine (EMLA) cream Apply to the Port-A-Cath site 30-60 minutes before treatment 30 g 0   memantine (NAMENDA) 10 MG tablet TAKE 1 TABLET BY MOUTH TWICE A DAY 180 tablet 2   mirtazapine (REMERON) 15 MG tablet Take 1 tablet (15 mg total) by mouth at bedtime. 30 tablet 2   omeprazole (PRILOSEC OTC) 20 MG tablet Take 20 mg by mouth daily.     osimertinib mesylate (TAGRISSO) 80 MG tablet Take 1 tablet (80 mg total) by mouth daily. 30 tablet 2   sildenafil (REVATIO) 20 MG tablet TAKE AS DIRECTED 10 tablet 11   tamsulosin (FLOMAX) 0.4 MG CAPS capsule Take 0.4 mg by  mouth daily.     LORazepam (ATIVAN) 1 MG tablet Take 1 tablet (1 mg total) by mouth as needed for anxiety (30 minutes prior to radiation treatment and may repeat just prior to procedure if needed.). (Patient not taking: Reported on 03/03/2021) 10 tablet 0   magic mouthwash SOLN Take 5 mLs by mouth 4 (four) times daily as needed for mouth pain. Swish and spit or swallow (Patient not taking: Reported on 03/03/2021) 240 mL 0   prochlorperazine (COMPAZINE) 10 MG tablet Take 1 tablet (10 mg total) by mouth every 6 (six) hours as needed for nausea or vomiting. (Patient not taking: No sig reported) 30 tablet 1   No current facility-administered medications for this visit.    SURGICAL HISTORY:  Past Surgical History:  Procedure Laterality Date   CAROTID DOPPLERS  09/2017   Mild non-obstructive disease   CATARACT EXTRACTION, BILATERAL     cataract, left  2018   CHEST   TUBE INSERTION Right 03/29/2019   Procedure: INSERTION PLEURAL DRAINAGE CATHETER;  Surgeon: [name], MD;  Location: [facility] OR;  Service: Thoracic;  Laterality: Right;   COLONOSCOPY  2016   clear, repeat in 10 yrs    Eye surgeries     , Lens attachment.  Vitrectomy   FEMORAL HERNIA REPAIR     HERNIA REPAIR     IR THORACENTESIS ASP PLEURAL SPACE W/IMG GUIDE  02/22/2019   IR THORACENTESIS ASP PLEURAL SPACE W/IMG GUIDE  03/13/2019   IR THORACENTESIS ASP PLEURAL SPACE W/IMG GUIDE  03/18/2020   LUMBAR LAMINECTOMY     PLEURAL BIOPSY Right 03/29/2019   Procedure: PLEURAL BIOPSY;  Surgeon: [name], MD;  Location: [place];  Service: Thoracic;  Laterality: Right;   PORTACATH PLACEMENT N/A 03/29/2019   Procedure: INSERTION PORT-A-CATH;  Surgeon: [name], MD;  Location: [place];  Service: Thoracic;  Laterality: N/A;   retinal attachment, right     sclearl buckle, right     TRANSTHORACIC ECHOCARDIOGRAM  12/14/2019   EF 50 EF 55% (low normal).  Abnormal septal wall motion related to BBB; indeterminate diastolic  parameters.  Mild RV enlargement with mildly reduced function.  Normal valves.  Normal atrial sizes.   victrectomy,right     VIDEO ASSISTED THORACOSCOPY Right 03/29/2019   Procedure: VIDEO ASSISTED THORACOSCOPY;  Surgeon: [name], MD;  Location: [facility] OR;  Service: Thoracic;  Laterality: Right;    REVIEW OF SYSTEMS:   Review of Systems Constitutional: Positive fatigue, appetite change, and weight loss. Negative for chills and fever.  HENT: Negative for mouth sores, nosebleeds, sore throat and trouble swallowing.   Eyes: Negative for eye problems and icterus. Respiratory: Positive for worsening cough and dyspnea on exertion. Negative for hemoptysis and wheezing.   Cardiovascular: Negative for chest pain and leg swelling. Gastrointestinal: Negative for abdominal pain, constipation, diarrhea, nausea and vomiting. Genitourinary: Negative for bladder incontinence, difficulty urinating, dysuria, frequency and hematuria.   Musculoskeletal: Positive right hip pain. Negative for back pain, gait problem, neck pain and neck stiffness. Skin: Negative for itching and rash. Neurological: Negative for dizziness, extremity weakness, gait problem, headaches, light-headedness and seizures. Hematological: Negative for adenopathy. Does not bruise/bleed easily. Psychiatric/Behavioral: Negative for confusion, depression and sleep disturbance. The patient is not nervous/anxious.     PHYSICAL EXAMINATION:  Blood pressure (!) 145/75, pulse 96, temperature 97.8 F (36.6 C), temperature source Oral, resp. rate 18, weight 184 lb (83.5 kg), SpO2 98 %.  ECOG PERFORMANCE STATUS: 1  Physical Exam  Constitutional: Oriented to person, place, and time and well-developed, well-nourished, and in no distress. HENT: Head: Normocephalic and atraumatic. Mouth/Throat: Oropharynx is clear and moist. No oropharyngeal exudate. Eyes: Conjunctivae are normal. Right eye exhibits no discharge. Left eye exhibits no  discharge. No scleral icterus. Neck: Normal range of motion. Neck supple. Cardiovascular: Normal rate, regular rhythm, normal heart sounds and intact distal pulses.   Pulmonary/Chest: Effort normal. Decreased breath sounds in the right lung. No respiratory distress. No wheezes. No rales. Abdominal: Soft. Bowel sounds are normal. Exhibits no distension and no mass. There is no tenderness.  Musculoskeletal: Normal range of motion. Exhibits no edema. Decreased flexion in right hip.  Lymphadenopathy:    No cervical adenopathy.  Neurological: Alert and oriented to person, place, and time. Exhibits normal muscle tone. Gait normal. Coordination normal. Ambulates with a cane. Skin: Skin is warm and dry. No rash noted. Not diaphoretic. No erythema. No pallor.  Psychiatric: Mood, memory and  judgment normal. Vitals reviewed.  LABORATORY DATA: Lab Results  Component Value Date   WBC 4.8 03/03/2021   HGB 12.2 (L) 03/03/2021   HCT 36.0 (L) 03/03/2021   MCV 92.5 03/03/2021   PLT 172 03/03/2021      Chemistry      Component Value Date/Time   NA 135 03/03/2021 1407   K 4.0 03/03/2021 1407   CL 102 03/03/2021 1407   CO2 25 03/03/2021 1407   BUN 19 03/03/2021 1407   CREATININE 1.05 03/03/2021 1407      Component Value Date/Time   CALCIUM 8.5 (L) 03/03/2021 1407   ALKPHOS 133 (H) 03/03/2021 1407   AST 13 (L) 03/03/2021 1407   ALT 9 03/03/2021 1407   BILITOT 0.3 03/03/2021 1407       RADIOGRAPHIC STUDIES:  CT Chest W Contrast  Result Date: 02/28/2021 CLINICAL DATA:  Stage IV non-small cell right lung cancer. Restaging. EXAM: CT CHEST, ABDOMEN, AND PELVIS WITH CONTRAST TECHNIQUE: Multidetector CT imaging of the chest, abdomen and pelvis was performed following the standard protocol during bolus administration of intravenous contrast. CONTRAST:  100mL OMNIPAQUE IOHEXOL 300 MG/ML  SOLN COMPARISON:  01/02/2021 FINDINGS: CT CHEST FINDINGS Cardiovascular: The heart size is normal. No substantial  pericardial effusion. Right Port-A-Cath tip is positioned in the distal SVC. Mediastinum/Nodes: 10 mm short axis precarinal node measured previously is 9 mm today. Small subcarinal node with amorphous soft tissue in the subcarinal station and right hilum is again noted no left hilar lymphadenopathy. The esophagus has normal imaging features. There is no axillary lymphadenopathy. Lungs/Pleura: Interval increase in volume loss right hemithorax. 1.2 x 1.1 cm parahilar right upper lobe pulmonary nodule is clearly progressive in the interval measuring 2.1 x 2.0 cm today on image 82/5. The 1.5 x 1.2 cm medial right lung nodule measured previously is now 1.8 x 1.3 cm and has become more incorporated into a nodular area of consolidative lung opacity. Nodularity along the minor fissure is progressive. 9 mm nodule on 88/5 was 5 mm previously (remeasured). Progressive collapse/consolidative disease in the right lower lobe with worsening interstitial opacity concerning for lymphangitic tumor spread. There is a new ill-defined nodular consolidative opacity in the paraspinal right lower lobe (85/5). Similar loculated posterior right pleural fluid collection with new left pleural effusion on the current study. Nodular pleural disease in the anterior right base (image 50/series 3) is progressive measuring 4.1 x 1.5 cm today compared to 1.9 x 0 point 9 cm previously. Musculoskeletal: Stable sclerosis posterior right twelfth rib. 14 mm sclerotic lesion in the L1 vertebral body similar to prior. Sclerosis in the lateral left sixth rib appears to be new in the interval. CT ABDOMEN PELVIS FINDINGS Hepatobiliary: No suspicious focal abnormality within the liver parenchyma. There is no evidence for gallstones, gallbladder wall thickening, or pericholecystic fluid. No intrahepatic or extrahepatic biliary dilation. Pancreas: No focal mass lesion. No dilatation of the main duct. No intraparenchymal cyst. No peripancreatic edema. Spleen: No  splenomegaly. No focal mass lesion. Adrenals/Urinary Tract: No adrenal nodule or mass. Kidneys unremarkable. No evidence for hydroureter. Tiny stone debris seen in the posterior right bladder previously is now visible in the posterior left bladder on image 119/3. Stomach/Bowel: Stomach is unremarkable. No gastric wall thickening. No evidence of outlet obstruction. Duodenum is normally positioned as is the ligament of Treitz. No small bowel wall thickening. No small bowel dilatation. The terminal ileum is normal. The appendix is not well visualized, but there is no edema or inflammation in   the region of the cecum. No gross colonic mass. No colonic wall thickening. Diverticuli are seen scattered along the entire length of the colon without CT findings of diverticulitis. Vascular/Lymphatic: No abdominal aortic aneurysm. No abdominal aortic atherosclerotic calcification. There is no gastrohepatic or hepatoduodenal ligament lymphadenopathy. No retroperitoneal or mesenteric lymphadenopathy. No pelvic sidewall lymphadenopathy. Reproductive: Prostate gland is enlarged. Other: No intraperitoneal free fluid. Musculoskeletal: Soft tissue density in the left groin region is compatible with prior herniorrhaphy. Interval progression of sclerotic changes in the anterior right acetabulum and superior pubic ramus. 1 cm sclerotic lesion seen previously in the right femoral neck has increased to 2 cm today. Posterior right iliac lesion shows more prominent overlying cortical loss. IMPRESSION: 1. Interval progression of right-sided pulmonary nodules with interval progression of interstitial opacity in the right lower lobe and new ill-defined nodular consolidative opacity in the paraspinal right lower lobe. Imaging features are compatible with progression of disease. Superimposed pneumonia cannot be excluded. 2. Interval progression of irregular nodular pleural disease anterior right costophrenic sulcus. 3. Similar appearance of  loculated posterior right pleural fluid collection with new left pleural effusion. 4. Sclerotic bone metastases are stable in some regions and clearly progressive in others. 5. Tiny stone debris again noted in the urinary bladder. 6. Prostatomegaly. 7. Aortic Atherosclerosis (ICD10-I70.0). Electronically Signed   By: Misty Stanley M.D.   On: 02/28/2021 09:39   CT Abdomen Pelvis W Contrast  Result Date: 02/28/2021 CLINICAL DATA:  Stage IV non-small cell right lung cancer. Restaging. EXAM: CT CHEST, ABDOMEN, AND PELVIS WITH CONTRAST TECHNIQUE: Multidetector CT imaging of the chest, abdomen and pelvis was performed following the standard protocol during bolus administration of intravenous contrast. CONTRAST:  133m OMNIPAQUE IOHEXOL 300 MG/ML  SOLN COMPARISON:  01/02/2021 FINDINGS: CT CHEST FINDINGS Cardiovascular: The heart size is normal. No substantial pericardial effusion. Right Port-A-Cath tip is positioned in the distal SVC. Mediastinum/Nodes: 10 mm short axis precarinal node measured previously is 9 mm today. Small subcarinal node with amorphous soft tissue in the subcarinal station and right hilum is again noted no left hilar lymphadenopathy. The esophagus has normal imaging features. There is no axillary lymphadenopathy. Lungs/Pleura: Interval increase in volume loss right hemithorax. 1.2 x 1.1 cm parahilar right upper lobe pulmonary nodule is clearly progressive in the interval measuring 2.1 x 2.0 cm today on image 82/5. The 1.5 x 1.2 cm medial right lung nodule measured previously is now 1.8 x 1.3 cm and has become more incorporated into a nodular area of consolidative lung opacity. Nodularity along the minor fissure is progressive. 9 mm nodule on 88/5 was 5 mm previously (remeasured). Progressive collapse/consolidative disease in the right lower lobe with worsening interstitial opacity concerning for lymphangitic tumor spread. There is a new ill-defined nodular consolidative opacity in the paraspinal  right lower lobe (85/5). Similar loculated posterior right pleural fluid collection with new left pleural effusion on the current study. Nodular pleural disease in the anterior right base (image 50/series 3) is progressive measuring 4.1 x 1.5 cm today compared to 1.9 x 0 point 9 cm previously. Musculoskeletal: Stable sclerosis posterior right twelfth rib. 14 mm sclerotic lesion in the L1 vertebral body similar to prior. Sclerosis in the lateral left sixth rib appears to be new in the interval. CT ABDOMEN PELVIS FINDINGS Hepatobiliary: No suspicious focal abnormality within the liver parenchyma. There is no evidence for gallstones, gallbladder wall thickening, or pericholecystic fluid. No intrahepatic or extrahepatic biliary dilation. Pancreas: No focal mass lesion. No dilatation of the main  duct. No intraparenchymal cyst. No peripancreatic edema. Spleen: No splenomegaly. No focal mass lesion. Adrenals/Urinary Tract: No adrenal nodule or mass. Kidneys unremarkable. No evidence for hydroureter. Tiny stone debris seen in the posterior right bladder previously is now visible in the posterior left bladder on image 119/3. Stomach/Bowel: Stomach is unremarkable. No gastric wall thickening. No evidence of outlet obstruction. Duodenum is normally positioned as is the ligament of Treitz. No small bowel wall thickening. No small bowel dilatation. The terminal ileum is normal. The appendix is not well visualized, but there is no edema or inflammation in the region of the cecum. No gross colonic mass. No colonic wall thickening. Diverticuli are seen scattered along the entire length of the colon without CT findings of diverticulitis. Vascular/Lymphatic: No abdominal aortic aneurysm. No abdominal aortic atherosclerotic calcification. There is no gastrohepatic or hepatoduodenal ligament lymphadenopathy. No retroperitoneal or mesenteric lymphadenopathy. No pelvic sidewall lymphadenopathy. Reproductive: Prostate gland is enlarged.  Other: No intraperitoneal free fluid. Musculoskeletal: Soft tissue density in the left groin region is compatible with prior herniorrhaphy. Interval progression of sclerotic changes in the anterior right acetabulum and superior pubic ramus. 1 cm sclerotic lesion seen previously in the right femoral neck has increased to 2 cm today. Posterior right iliac lesion shows more prominent overlying cortical loss. IMPRESSION: 1. Interval progression of right-sided pulmonary nodules with interval progression of interstitial opacity in the right lower lobe and new ill-defined nodular consolidative opacity in the paraspinal right lower lobe. Imaging features are compatible with progression of disease. Superimposed pneumonia cannot be excluded. 2. Interval progression of irregular nodular pleural disease anterior right costophrenic sulcus. 3. Similar appearance of loculated posterior right pleural fluid collection with new left pleural effusion. 4. Sclerotic bone metastases are stable in some regions and clearly progressive in others. 5. Tiny stone debris again noted in the urinary bladder. 6. Prostatomegaly. 7. Aortic Atherosclerosis (ICD10-I70.0). Electronically Signed   By: Eric  Mansell M.D.   On: 02/28/2021 09:39     ASSESSMENT/PLAN:  This is a very pleasant 70-year-old Caucasian male who was diagnosed with stage IV non-small cell lung cancer, adenocarcinoma with a positive EGFR resistant mutation in exon 20.  He presented with extensive disease involving the right upper lobe, right middle lobe, and right lower lobe with extensive pleural-based metastases.  He also has mediastinal and hilar adenopathy and a right malignant pleural effusion.  He also has metastatic disease to the brain and a suspicious liver lesion.   He completed SRS treatment to the metastatic disease to the brain under care of Dr. Manning.    He had a pleurex catheter placed under the care of Dr. Hendrickson for his malignant pleural effusion.  This was removed 07/11/2019   He then had SRS treatment to the another suspicious brain lesion on 01/23/20 under the care of Dr. Manning as well as in November 2021.  He previously underwent systsystemic chemotherapy with carboplatin AUC of 5, Alimta 500 mg/m, and Keytruda 200 mg IV every 3 weeks.  He is status post 20 cycles. Starting from cycle #5, he had been on maintenance Alimta 500 mg/m2 and Keytruda 200 mg IV every 3 weeks. Keytruda was discontinued due to pneumonitis starting from cycle #17. This was discontinued after cycle #21 due to evidence for disease progression.    He had repeat PET scan and MRI of the brain performed.  The PET scan showed evidence for disease progression with metastatic disease involving the thoracic and abdominal lymph nodes as well as the right hemithorax   and bones.  He also has loculated moderate right pleural effusion and small left pleural effusion.  The PET scan also showed prostate enlargement and his PSA has been rising recently.  He was seen by urology and is being followed.   He then underwent treatment with the new targeted agent Mobocertinib (Axkibity) that was recently approved by the FDA for patient with EGFR exon 20 mutation. He started this on 06/29/20. This was discontinued in May 2022 after the development of numerous new metastatic brain lesions.    He completed whole brain radiation under the care of Dr. Manning in May 2022.   He is currently undergoing oral targeted treatment with Tagrisso 80 mg p.o. daily. His first dose was on 01/13/21. He is status post 7 weeks of treatment.   The patient recently had a restaging CT scan performed. Dr. Mohamed personally and independently reviewed the scan and discussed the results with the patient. The scan showed evidence for disease progression. The scan showed Interval progression of right-sided pulmonary nodules with interval progression of interstitial opacity in the right lower lobe and new ill-defined  nodular consolidative opacity in the paraspinal right lower lobe. Interval progression of irregular nodular pleural disease anterior right costophrenic sulcus. There was some progressive bone metastases.   Therefore, we will discontinue his treatment with tagrisso.   Dr. Mohamed discussed several options including referral to palliative care/hospice care vs targeted treatment for EGFR exon 20 insertion RYBREVANT (amivantamab). Another option would be systemic chemotherapy likely with docetaxel and Cyramza. After a lengthy discussion the patient would like to proceed with Rybrevant. I have attached information/patient education to his after visit summary.   Dr. Mohamed discussed that the risk of infusion reactions with the first dose is high with this medication so we would need to formulate a care plan with pre-medications. We will work with pharmacy to build this care plan.   We will see him back for a follow up visit in 1 week for evaluation before starting his first cycle of treatment.   He will continue to take hycodan for his cough. We will arrange for an ultrasound thoracentesis to hopefully drain some of the loculated pleural effusion to help with his breathing.   Regarding the right hip pain, the scan noted that the posterior right iliac lesion has more prominent overlying cortical loss. There is also a 2 cm lesion int he right femoral neck as well. I will reach out to radiation oncology to see if there is a role for palliative radiation to this region. In the meantime, discussed that the patient can take tylenol.   He will continue to take remeron for the decreased appetite and insomnia.   The patient was advised to call immediately if he has any concerning symptoms in the interval. The patient voices understanding of current disease status and treatment options and is in agreement with the current care plan. All questions were answered. The patient knows to call the clinic with any  problems, questions or concerns. We can certainly see the patient much sooner if necessary        Orders Placed This Encounter  Procedures   US Thoracentesis Asp Pleural space w/IMG guide    Standing Status:   Future    Standing Expiration Date:   03/03/2022    Order Specific Question:   Are labs required for specimen collection?    Answer:   No    Order Specific Question:   Reason for Exam (SYMPTOM  OR   DIAGNOSIS REQUIRED)    Answer:   Shortness of breath, Lung cancer, large loculated pleural effusion (right)    Order Specific Question:   Preferred imaging location?    Answer:   [facility]   CBC with Differential (Cancer Center Only)    Standing Status:   Standing    Number of Occurrences:   20    Standing Expiration Date:   03/03/2022   CMP (Cancer Center only)    Standing Status:   Standing    Number of Occurrences:   20    Standing Expiration Date:   03/03/2022       L , PA-C 03/03/21  ADDENDUM: Hematology/Oncology Attending: I had a face-to-face encounter with the patient today.  I reviewed his record, lab and scan and recommended his care plan.  This is a very pleasant 70 years old white male with metastatic non-small cell lung cancer, adenocarcinoma with positive EGFR mutation but in exon 20.  The patient treated with systemic chemotherapy with carboplatin, Alimta and Keytruda followed by maintenance Alimta and Keytruda status post 21 cycles discontinued secondary to disease progression.  He was then treated with Mobocertinib (Axkibity) for several months but this was also again discontinued secondary to disease progression with multiple brain metastasis status post whole brain irradiation. The patient is started treatment with Tagrisso 80 mg p.o. daily status post 7 weeks of treatment.  He has been tolerating this treatment well except for the fatigue and the persistent dry cough. He had repeat CT scan of the chest, abdomen pelvis performed  recently.  I personally and independently reviewed the scan images and discussed the results with the patient and his wife. Unfortunately his scan showed evidence for disease progression with increase in the size of multiple pulmonary nodules in addition to development of small left pleural effusion and persistent enlargement of right pleural effusion as well as a sclerotic bone lesion in the acetabular area. I had a lengthy discussion with the patient and his wife about his current condition and treatment options. I recommended for the patient to discontinue his treatment with Tagrisso in the next few days. I discussed with the patient other treatment options including palliative care versus palliative treatment with second line targeted therapy with Amivantamab, at a lower dose on days 1 and 2 for the first week followed by weekly treatment for 2 weeks followed by treatment every 2 weeks if the patient tolerated the first few weeks well.  I discussed with the patient the adverse effect of this treatment especially with the concerning hypersensitivity reaction with the first 1 or 2 doses of this treatment. Unfortunately treatment has no current treatment plan in our system and we will work with pharmacy to create a treatment plan so the patient can start this treatment next week. For the persistent large right pleural effusion, I will arrange for the patient to have ultrasound-guided right thoracentesis. For the dry cough he will continue his current treatment with Hycodan. For the sclerotic lesion in the acetabular area, we will refer the patient to Dr. Manning for consideration of palliative radiotherapy to this area. The patient and his wife are in agreement with the current plan. We will see him back for follow-up visit in 2 weeks for evaluation and management of any adverse effect of his treatment. The patient was advised to call immediately if he has any other concerning symptoms in the  interval. The total time spent in the appointment was 55 minutes. Disclaimer: This Brown   was dictated with voice recognition software. Similar sounding words can inadvertently be transcribed and may be missed upon review. Mohamed K Mohamed, MD 03/03/21  

## 2021-03-01 ENCOUNTER — Encounter: Payer: Self-pay | Admitting: Cardiology

## 2021-03-01 NOTE — Assessment & Plan Note (Signed)
Progressively worse now with his lung cancer.  No PND, orthopnea, and relatively stable edema.  Nothing to suggest heart failure.  Seems relatively euvolemic.

## 2021-03-01 NOTE — Assessment & Plan Note (Addendum)
Mostly related to a Combination of lymphedema and hypoalbuminemia, and not CHF.  No other symptoms to suggest CHF.  His albumin levels are Nicely and there is definitely midodrine improvement to his swelling.  He is taking HCTZ which seems to keep it at [place], but also recommended using support socks.

## 2021-03-01 NOTE — Assessment & Plan Note (Signed)
Blood pressure seems fine on HCTZ which is actually being used more for edema than true blood pressure control.  In fact, with him having some dizziness, I recommend that if he were to feel dizzy and lightheaded that he should hold the HCTZ.  We may be getting to the point where he uses it only as needed.

## 2021-03-01 NOTE — Assessment & Plan Note (Signed)
Stable finding on EKG.  Avoid AV nodal agents.  Currently his palpitations seem to have stabilized.  No signs or symptoms of bradycardia.  No indication for pacemaker.

## 2021-03-03 ENCOUNTER — Other Ambulatory Visit: Payer: Self-pay

## 2021-03-03 ENCOUNTER — Inpatient Hospital Stay: Payer: Medicare Other

## 2021-03-03 ENCOUNTER — Inpatient Hospital Stay: Payer: Medicare Other | Attending: Internal Medicine | Admitting: Physician Assistant

## 2021-03-03 VITALS — BP 145/75 | HR 96 | Temp 97.8°F | Resp 18 | Wt 184.0 lb

## 2021-03-03 DIAGNOSIS — M25551 Pain in right hip: Secondary | ICD-10-CM | POA: Diagnosis not present

## 2021-03-03 DIAGNOSIS — C342 Malignant neoplasm of middle lobe, bronchus or lung: Secondary | ICD-10-CM | POA: Diagnosis not present

## 2021-03-03 DIAGNOSIS — C7931 Secondary malignant neoplasm of brain: Secondary | ICD-10-CM | POA: Diagnosis not present

## 2021-03-03 DIAGNOSIS — Z923 Personal history of irradiation: Secondary | ICD-10-CM | POA: Insufficient documentation

## 2021-03-03 DIAGNOSIS — C3491 Malignant neoplasm of unspecified part of right bronchus or lung: Secondary | ICD-10-CM | POA: Diagnosis not present

## 2021-03-03 DIAGNOSIS — Z95828 Presence of other vascular implants and grafts: Secondary | ICD-10-CM

## 2021-03-03 DIAGNOSIS — Z79899 Other long term (current) drug therapy: Secondary | ICD-10-CM | POA: Insufficient documentation

## 2021-03-03 DIAGNOSIS — Z7189 Other specified counseling: Secondary | ICD-10-CM

## 2021-03-03 DIAGNOSIS — Z5111 Encounter for antineoplastic chemotherapy: Secondary | ICD-10-CM | POA: Diagnosis not present

## 2021-03-03 LAB — CBC WITH DIFFERENTIAL (CANCER CENTER ONLY)
Abs Immature Granulocytes: 0.03 10*3/uL (ref 0.00–0.07)
Basophils Absolute: 0 10*3/uL (ref 0.0–0.1)
Basophils Relative: 0 %
Eosinophils Absolute: 0 10*3/uL (ref 0.0–0.5)
Eosinophils Relative: 1 %
HCT: 36 % — ABNORMAL LOW (ref 39.0–52.0)
Hemoglobin: 12.2 g/dL — ABNORMAL LOW (ref 13.0–17.0)
Immature Granulocytes: 1 %
Lymphocytes Relative: 15 %
Lymphs Abs: 0.7 10*3/uL (ref 0.7–4.0)
MCH: 31.4 pg (ref 26.0–34.0)
MCHC: 33.9 g/dL (ref 30.0–36.0)
MCV: 92.5 fL (ref 80.0–100.0)
Monocytes Absolute: 0.3 10*3/uL (ref 0.1–1.0)
Monocytes Relative: 7 %
Neutro Abs: 3.7 10*3/uL (ref 1.7–7.7)
Neutrophils Relative %: 76 %
Platelet Count: 172 10*3/uL (ref 150–400)
RBC: 3.89 MIL/uL — ABNORMAL LOW (ref 4.22–5.81)
RDW: 13.2 % (ref 11.5–15.5)
WBC Count: 4.8 10*3/uL (ref 4.0–10.5)
nRBC: 0 % (ref 0.0–0.2)

## 2021-03-03 LAB — CMP (CANCER CENTER ONLY)
ALT: 9 U/L (ref 0–44)
AST: 13 U/L — ABNORMAL LOW (ref 15–41)
Albumin: 2.4 g/dL — ABNORMAL LOW (ref 3.5–5.0)
Alkaline Phosphatase: 133 U/L — ABNORMAL HIGH (ref 38–126)
Anion gap: 8 (ref 5–15)
BUN: 19 mg/dL (ref 8–23)
CO2: 25 mmol/L (ref 22–32)
Calcium: 8.5 mg/dL — ABNORMAL LOW (ref 8.9–10.3)
Chloride: 102 mmol/L (ref 98–111)
Creatinine: 1.05 mg/dL (ref 0.61–1.24)
GFR, Estimated: >60 mL/min (ref >60)
Glucose, Bld: 129 mg/dL — ABNORMAL HIGH (ref 70–99)
Potassium: 4 mmol/L (ref 3.5–5.1)
Sodium: 135 mmol/L (ref 135–145)
Total Bilirubin: 0.3 mg/dL (ref 0.3–1.2)
Total Protein: 5.7 g/dL — ABNORMAL LOW (ref 6.5–8.1)

## 2021-03-03 MED ORDER — HEPARIN SOD (PORK) LOCK FLUSH 100 UNIT/ML IV SOLN
500.0000 [IU] | Freq: Once | INTRAVENOUS | Status: AC | PRN
  Administered 2021-03-03: 500 [IU]
  Filled 2021-03-03: qty 5

## 2021-03-03 MED ORDER — SODIUM CHLORIDE 0.9% FLUSH
10.0000 mL | INTRAVENOUS | Status: DC | PRN
  Administered 2021-03-03: 10 mL
  Filled 2021-03-03: qty 10

## 2021-03-05 NOTE — Progress Notes (Signed)
Histology and Location of Primary Cancer:  Stage IV non-small cell lung cancer, adenocarcinoma with a positive EGFR resistant mutation  Sites of Visceral and Bony Metastatic Disease:  CT C/A/P w/ Contrast  02/27/2021 --IMPRESSION: 1. Interval progression of right-sided pulmonary nodules with interval progression of interstitial opacity in the right lower lobe and new ill-defined nodular consolidative opacity in the paraspinal right lower lobe. Imaging features are compatible with progression of disease. Superimposed pneumonia cannot be excluded. 2. Interval progression of irregular nodular pleural disease anterior right costophrenic sulcus.  3. Similar appearance of loculated posterior right pleural fluid collection with new left pleural effusion. 4. Sclerotic bone metastases are stable in some regions and clearly progressive in others. 5. Tiny stone debris again noted in the urinary bladder. 6. Prostatomegaly. 7. Aortic Atherosclerosis (ICD10-I70.0).  Location(s) of Symptomatic Metastases:  Right hip (posterior right iliac lesion has more prominent overlying cortical loss. There is also a 2 cm lesion in the right femoral neck as well)  Past/Anticipated chemotherapy by medical oncology, if any:  Under care of Dr. Curt Bears 03/03/2021 [facility] [place] PA-C's note) --CURRENT THERAPY:  Targeted therapy with Tagrisso 80 mg p.o. daily. First dose 01/13/21. Status post 7 weeks of treatment.  Restaging CT scan showed evidence for disease progression.Marland KitchenMarland KitchenTherefore, we will discontinue his treatment with tagrisso --PRIOR THERAPY: SRS treatment to the metastatic disease to the brain under the care of Dr. Tammi Klippel. Completed on 04/07/2019. SRS to the suspicious small brain lesion in the thalamocapsular region 01/23/2020 under the care of Dr. Tammi Klippel Systemic chemotherapy with carboplatin for AUC of 5, Alimta 500 mg/M2 and Keytruda 200 mg IV every 3 weeks.  First dose April 04, 2019.  Status post  21 cycles  Starting from cycle #5 the patient is on maintenance treatment with Alimta and Keytruda every 3 weeks.  Starting from cycle #19 he will be on single agent Alimta.  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 SRS to the metastatic brain lesions under the care of Dr. Tammi Klippel on 07/03/20  whole brain irradiation for multiple metastatic brain lesions in May 2202 Mobocertinib (Axkibity) 542 mg p.o. daily.  First dose was June 29, 2020.  Status post 6 months of treatment.  This was discontinued secondary to disease progression in the brain as well as the chest in May 2022  --PLAN:  After a lengthy discussion the patient would like to proceed with [place]bre[place] We will see him back for a follow up visit in 1 week for evaluation before starting his first cycle of treatment He will continue to take hycodan for his cough. We will arrange for an ultrasound thoracentesis to hopefully drain some of the loculated pleural effusion to help with his breathing.  Pain on a scale of 0-10 is: When not controlled, right hip pain is a 10 out of 10, and impedes mobility.  Reports the pain has been much more manageable the past 2 weeks   Ambulatory status? Walker? Wheelchair?: Currently able to walk unassisted, but keeps a cane with him incase right hip pain flares up  SAFETY ISSUES: Prior radiation? Yes --12/18/2020 - 12/31/2020:  The whole brain was treated to 30 Gy in 10 fractions of 3 Gy  --07/03/2020: SRS//PTV3: 69m right temporal gyrus target was treated to a prescription dose of 20 Gy in a single fraction.             SRS//PTV4: 268mleft periventricular target was treated to a prescription dose of 20 Gy in a single fraction.    --  01/23/2020: SRS//PTV2: 9m right thalamocapsular target was treated to a prescription dose of 20 Gy in a single fraction --04/07/2019: SRS// PTV1:  6 mm  right parietal target was treated to a prescription dose of 20 Gy in a single fraction.    Pacemaker/ICD? No Possible current pregnancy? N/A Is the patient on methotrexate? No  Current Complaints / other details:   03/14/2021: Scheduled for ultrasound-guided right thoracentesis  Reports lingering fatigue and hypersomnia from most recent whole brain radiation

## 2021-03-06 ENCOUNTER — Other Ambulatory Visit: Payer: Self-pay

## 2021-03-06 ENCOUNTER — Ambulatory Visit
Admission: RE | Admit: 2021-03-06 | Discharge: 2021-03-06 | Disposition: A | Discharge status: Home / Self Care | Payer: Medicare Other | Source: Ambulatory Visit | Attending: Radiation Oncology | Admitting: Radiation Oncology

## 2021-03-06 ENCOUNTER — Ambulatory Visit: Admission: RE | Admit: 2021-03-06 | Payer: Medicare Other | Source: Ambulatory Visit | Admitting: Radiation Oncology

## 2021-03-06 VITALS — BP 134/93 | HR 90 | Temp 97.7°F | Resp 18 | Wt 180.2 lb

## 2021-03-06 DIAGNOSIS — C7931 Secondary malignant neoplasm of brain: Secondary | ICD-10-CM | POA: Diagnosis not present

## 2021-03-06 DIAGNOSIS — C7951 Secondary malignant neoplasm of bone: Secondary | ICD-10-CM | POA: Diagnosis not present

## 2021-03-06 DIAGNOSIS — C3491 Malignant neoplasm of unspecified part of right bronchus or lung: Secondary | ICD-10-CM | POA: Diagnosis not present

## 2021-03-06 NOTE — Progress Notes (Signed)
Radiation Oncology         [phone] ________________________________  Name: Ethan Brown MRN: [id number]  Date: 03/06/2021  DOB: Nov 16, 1950  Follow-Up Visit Note  CC: Laurey Morale, MD  Curt Bears, MD  Diagnosis:   70 year old gentleman with right femoral neck metastases from adenocarcinoma of the right middle lung    ICD-10-CM   1. Bone metastases (HCC)  C79.51       Interval Since Last Radiation: 8 months  Narrative:  The patient returns today due to recent complaints of right hip pain impacting ambulation.  Patient has been shown to have skeletal metastatic disease diffusely with metastases which are blastic within the right femoral neck.  During follow-up with Dr. Earlie Server earlier this week, he was discovered to have right hip pain and sent today to discuss potential palliative radiation.  However, patient does describe the pain is not limiting his abilities.  He reports a pain level to be 3 out of 10 and intermittent.  He does not require any pain medication for it.                        ALLERGIES:  has No Known Allergies.  Meds: Current Outpatient Medications  Medication Sig Dispense Refill   cyanocobalamin (,VITAMIN B-12,) 1000 MCG/ML injection INJECT 1 ML (1,000 MCG TOTAL) INTO THE MUSCLE ONCE FOR 1 DOSE. 5 mL 1   cycloSPORINE (RESTASIS) 0.05 % ophthalmic emulsion Place 1 drop into both eyes 2 (two) times daily.     hydrochlorothiazide (MICROZIDE) 12.5 MG capsule TAKE 1 CAPSULE BY MOUTH EVERY DAY 90 capsule 3   HYDROcodone bit-homatropine (HYCODAN) 5-1.5 MG/5ML syrup Take 5 mLs by mouth every 6 (six) hours as needed for cough. 473 mL 0   hydrocortisone 1 % lotion Apply 1 application topically 2 (two) times daily. 113 g 0   lidocaine (XYLOCAINE) 2 % solution Use as directed 5 mLs in the mouth or throat every 3 (three) hours as needed for mouth pain. Gargle and spit 200 mL 2   lidocaine-prilocaine (EMLA) cream Apply to the Port-A-Cath site 30-60 minutes  before treatment 30 g 0   LORazepam (ATIVAN) 1 MG tablet Take 1 tablet (1 mg total) by mouth as needed for anxiety (30 minutes prior to radiation treatment and may repeat just prior to procedure if needed.). (Patient not taking: Reported on 03/03/2021) 10 tablet 0   magic mouthwash SOLN Take 5 mLs by mouth 4 (four) times daily as needed for mouth pain. Swish and spit or swallow (Patient not taking: Reported on 03/03/2021) 240 mL 0   memantine (NAMENDA) 10 MG tablet TAKE 1 TABLET BY MOUTH TWICE A DAY 180 tablet 2   mirtazapine (REMERON) 15 MG tablet Take 1 tablet (15 mg total) by mouth at bedtime. 30 tablet 2   omeprazole (PRILOSEC OTC) 20 MG tablet Take 20 mg by mouth daily.     osimertinib mesylate (TAGRISSO) 80 MG tablet Take 1 tablet (80 mg total) by mouth daily. 30 tablet 2   prochlorperazine (COMPAZINE) 10 MG tablet Take 1 tablet (10 mg total) by mouth every 6 (six) hours as needed for nausea or vomiting. (Patient not taking: No sig reported) 30 tablet 1   sildenafil (REVATIO) 20 MG tablet TAKE AS DIRECTED 10 tablet 11   tamsulosin (FLOMAX) 0.4 MG CAPS capsule Take 0.4 mg by mouth daily.     No current facility-administered medications for this encounter.    Physical Findings: The  patient is in no acute distress. Patient is alert and oriented. Patient's scalp is epilated from recent all brain radiation.  He appears neurologically intact.  No significant changes.  Lab Findings: Lab Results  Component Value Date   WBC 4.8 03/03/2021   WBC 7.5 03/28/2019   HGB 12.2 (L) 03/03/2021   HCT 36.0 (L) 03/03/2021   PLT 172 03/03/2021    Lab Results  Component Value Date   NA 135 03/03/2021   K 4.0 03/03/2021   CO2 25 03/03/2021   GLUCOSE 129 (H) 03/03/2021   BUN 19 03/03/2021   CREATININE 1.05 03/03/2021   BILITOT 0.3 03/03/2021   ALKPHOS 133 (H) 03/03/2021   AST 13 (L) 03/03/2021   ALT 9 03/03/2021   PROT 5.7 (L) 03/03/2021   ALBUMIN 2.4 (L) 03/03/2021   CALCIUM 8.5 (L) 03/03/2021    ANIONGAP 8 03/03/2021    Radiographic Findings: CT Chest W Contrast  Result Date: 02/28/2021 CLINICAL DATA:  Stage IV non-small cell right lung cancer. Restaging. EXAM: CT CHEST, ABDOMEN, AND PELVIS WITH CONTRAST TECHNIQUE: Multidetector CT imaging of the chest, abdomen and pelvis was performed following the standard protocol during bolus administration of intravenous contrast. CONTRAST:  131mL OMNIPAQUE IOHEXOL 300 MG/ML  SOLN COMPARISON:  01/02/2021 FINDINGS: CT CHEST FINDINGS Cardiovascular: The heart size is normal. No substantial pericardial effusion. Right Port-A-Cath tip is positioned in the distal SVC. Mediastinum/Nodes: 10 mm short axis precarinal node measured previously is 9 mm today. Small subcarinal node with amorphous soft tissue in the subcarinal station and right hilum is again noted no left hilar lymphadenopathy. The esophagus has normal imaging features. There is no axillary lymphadenopathy. Lungs/Pleura: Interval increase in volume loss right hemithorax. 1.2 x 1.1 cm parahilar right upper lobe pulmonary nodule is clearly progressive in the interval measuring 2.1 x 2.0 cm today on image 82/5. The 1.5 x 1.2 cm medial right lung nodule measured previously is now 1.8 x 1.3 cm and has become more incorporated into a nodular area of consolidative lung opacity. Nodularity along the minor fissure is progressive. 9 mm nodule on 88/5 was 5 mm previously (remeasured). Progressive collapse/consolidative disease in the right lower lobe with worsening interstitial opacity concerning for lymphangitic tumor spread. There is a new ill-defined nodular consolidative opacity in the paraspinal right lower lobe (85/5). Similar loculated posterior right pleural fluid collection with new left pleural effusion on the current study. Nodular pleural disease in the anterior right base (image 50/series 3) is progressive measuring 4.1 x 1.5 cm today compared to 1.9 x 0 point 9 cm previously. Musculoskeletal: Stable  sclerosis posterior right twelfth rib. 14 mm sclerotic lesion in the L1 vertebral body similar to prior. Sclerosis in the lateral left sixth rib appears to be new in the interval. CT ABDOMEN PELVIS FINDINGS Hepatobiliary: No suspicious focal abnormality within the liver parenchyma. There is no evidence for gallstones, gallbladder wall thickening, or pericholecystic fluid. No intrahepatic or extrahepatic biliary dilation. Pancreas: No focal mass lesion. No dilatation of the main duct. No intraparenchymal cyst. No peripancreatic edema. Spleen: No splenomegaly. No focal mass lesion. Adrenals/Urinary Tract: No adrenal nodule or mass. Kidneys unremarkable. No evidence for hydroureter. Tiny stone debris seen in the posterior right bladder previously is now visible in the posterior left bladder on image 119/3. Stomach/Bowel: Stomach is unremarkable. No gastric wall thickening. No evidence of outlet obstruction. Duodenum is normally positioned as is the ligament of Treitz. No small bowel wall thickening. No small bowel dilatation. The terminal ileum is  normal. The appendix is not well visualized, but there is no edema or inflammation in the region of the cecum. No gross colonic mass. No colonic wall thickening. Diverticuli are seen scattered along the entire length of the colon without CT findings of diverticulitis. Vascular/Lymphatic: No abdominal aortic aneurysm. No abdominal aortic atherosclerotic calcification. There is no gastrohepatic or hepatoduodenal ligament lymphadenopathy. No retroperitoneal or mesenteric lymphadenopathy. No pelvic sidewall lymphadenopathy. Reproductive: Prostate gland is enlarged. Other: No intraperitoneal free fluid. Musculoskeletal: Soft tissue density in the left groin region is compatible with prior herniorrhaphy. Interval progression of sclerotic changes in the anterior right acetabulum and superior pubic ramus. 1 cm sclerotic lesion seen previously in the right femoral neck has increased  to 2 cm today. Posterior right iliac lesion shows more prominent overlying cortical loss. IMPRESSION: 1. Interval progression of right-sided pulmonary nodules with interval progression of interstitial opacity in the right lower lobe and new ill-defined nodular consolidative opacity in the paraspinal right lower lobe. Imaging features are compatible with progression of disease. Superimposed pneumonia cannot be excluded. 2. Interval progression of irregular nodular pleural disease anterior right costophrenic sulcus. 3. Similar appearance of loculated posterior right pleural fluid collection with new left pleural effusion. 4. Sclerotic bone metastases are stable in some regions and clearly progressive in others. 5. Tiny stone debris again noted in the urinary bladder. 6. Prostatomegaly. 7. Aortic Atherosclerosis (ICD10-I70.0). Electronically Signed   By: Misty Stanley M.D.   On: 02/28/2021 09:39   CT Abdomen Pelvis W Contrast  Result Date: 02/28/2021 CLINICAL DATA:  Stage IV non-small cell right lung cancer. Restaging. EXAM: CT CHEST, ABDOMEN, AND PELVIS WITH CONTRAST TECHNIQUE: Multidetector CT imaging of the chest, abdomen and pelvis was performed following the standard protocol during bolus administration of intravenous contrast. CONTRAST:  117mL OMNIPAQUE IOHEXOL 300 MG/ML  SOLN COMPARISON:  01/02/2021 FINDINGS: CT CHEST FINDINGS Cardiovascular: The heart size is normal. No substantial pericardial effusion. Right Port-A-Cath tip is positioned in the distal SVC. Mediastinum/Nodes: 10 mm short axis precarinal node measured previously is 9 mm today. Small subcarinal node with amorphous soft tissue in the subcarinal station and right hilum is again noted no left hilar lymphadenopathy. The esophagus has normal imaging features. There is no axillary lymphadenopathy. Lungs/Pleura: Interval increase in volume loss right hemithorax. 1.2 x 1.1 cm parahilar right upper lobe pulmonary nodule is clearly progressive in the  interval measuring 2.1 x 2.0 cm today on image 82/5. The 1.5 x 1.2 cm medial right lung nodule measured previously is now 1.8 x 1.3 cm and has become more incorporated into a nodular area of consolidative lung opacity. Nodularity along the minor fissure is progressive. 9 mm nodule on 88/5 was 5 mm previously (remeasured). Progressive collapse/consolidative disease in the right lower lobe with worsening interstitial opacity concerning for lymphangitic tumor spread. There is a new ill-defined nodular consolidative opacity in the paraspinal right lower lobe (85/5). Similar loculated posterior right pleural fluid collection with new left pleural effusion on the current study. Nodular pleural disease in the anterior right base (image 50/series 3) is progressive measuring 4.1 x 1.5 cm today compared to 1.9 x 0 point 9 cm previously. Musculoskeletal: Stable sclerosis posterior right twelfth rib. 14 mm sclerotic lesion in the L1 vertebral body similar to prior. Sclerosis in the lateral left sixth rib appears to be new in the interval. CT ABDOMEN PELVIS FINDINGS Hepatobiliary: No suspicious focal abnormality within the liver parenchyma. There is no evidence for gallstones, gallbladder wall thickening, or pericholecystic fluid. No  intrahepatic or extrahepatic biliary dilation. Pancreas: No focal mass lesion. No dilatation of the main duct. No intraparenchymal cyst. No peripancreatic edema. Spleen: No splenomegaly. No focal mass lesion. Adrenals/Urinary Tract: No adrenal nodule or mass. Kidneys unremarkable. No evidence for hydroureter. Tiny stone debris seen in the posterior right bladder previously is now visible in the posterior left bladder on image 119/3. Stomach/Bowel: Stomach is unremarkable. No gastric wall thickening. No evidence of outlet obstruction. Duodenum is normally positioned as is the ligament of Treitz. No small bowel wall thickening. No small bowel dilatation. The terminal ileum is normal. The appendix is  not well visualized, but there is no edema or inflammation in the region of the cecum. No gross colonic mass. No colonic wall thickening. Diverticuli are seen scattered along the entire length of the colon without CT findings of diverticulitis. Vascular/Lymphatic: No abdominal aortic aneurysm. No abdominal aortic atherosclerotic calcification. There is no gastrohepatic or hepatoduodenal ligament lymphadenopathy. No retroperitoneal or mesenteric lymphadenopathy. No pelvic sidewall lymphadenopathy. Reproductive: Prostate gland is enlarged. Other: No intraperitoneal free fluid. Musculoskeletal: Soft tissue density in the left groin region is compatible with prior herniorrhaphy. Interval progression of sclerotic changes in the anterior right acetabulum and superior pubic ramus. 1 cm sclerotic lesion seen previously in the right femoral neck has increased to 2 cm today. Posterior right iliac lesion shows more prominent overlying cortical loss. IMPRESSION: 1. Interval progression of right-sided pulmonary nodules with interval progression of interstitial opacity in the right lower lobe and new ill-defined nodular consolidative opacity in the paraspinal right lower lobe. Imaging features are compatible with progression of disease. Superimposed pneumonia cannot be excluded. 2. Interval progression of irregular nodular pleural disease anterior right costophrenic sulcus. 3. Similar appearance of loculated posterior right pleural fluid collection with new left pleural effusion. 4. Sclerotic bone metastases are stable in some regions and clearly progressive in others. 5. Tiny stone debris again noted in the urinary bladder. 6. Prostatomegaly. 7. Aortic Atherosclerosis (ICD10-I70.0). Electronically Signed   By: Misty Stanley M.D.   On: 02/28/2021 09:39    Impression:  The patient has metastatic involvement of the right hip and could potentially benefit from palliative radiotherapy to this area if he develops increasing pain.   On my review, the patient's metastatic disease does involve the femoral neck which would be worrisome for femoral neck fracture, however the lesions appear to be plastic and limited to the marrow space with no destruction or involvement of the bony cortex.  Accordingly, I think his risk for pathologic fracture is very small.    Patient is recovering from whole brain radiotherapy and continues to suffer with some somnolence which may be related to subacute demyelination.  Plan:  Today, I talked to the patient about the findings and work-up thus far.  We discussed the natural history of bone metastases and general treatment, highlighting the role of radiotherapy in the management.  We discussed the available radiation techniques, and focused on the details of logistics and delivery.  We reviewed the anticipated acute and late sequelae associated with radiation in this setting.  The patient was encouraged to ask questions that I answered to the best of my ability.    At this point, patient's level of pain in the right hip does not seem to warrant palliative radiation by his judgment, and I think I would agree.  We did review how radiation would be performed as well as anticipated effects.  The patient will reach out to [place] if his pain worsens  and he decides to pursue hip radiotherapy.  I spent 60 minutes minutes face to face with the patient and more than 50% of that time was spent in counseling and/or coordination of care.   _____________________________________  Sheral Apley. Tammi Klippel, M.D.

## 2021-03-07 ENCOUNTER — Telehealth: Payer: Self-pay | Admitting: Physician Assistant

## 2021-03-07 NOTE — Telephone Encounter (Signed)
Scheduled appts per LOS. Can not scheduled any further treatments without care plan in place. Patient is starting a new drug and pharmacy is currently working on building the treatment plan. Will schedule further appts once updated. Called patient. Not able to leave msg. Will attempt to call again

## 2021-03-11 ENCOUNTER — Other Ambulatory Visit: Payer: Self-pay | Admitting: Internal Medicine

## 2021-03-11 ENCOUNTER — Inpatient Hospital Stay: Payer: Medicare Other

## 2021-03-11 ENCOUNTER — Inpatient Hospital Stay: Payer: Medicare Other | Admitting: Physician Assistant

## 2021-03-11 ENCOUNTER — Ambulatory Visit: Payer: Self-pay

## 2021-03-11 ENCOUNTER — Telehealth: Payer: Self-pay

## 2021-03-11 NOTE — Telephone Encounter (Signed)
Per Cassandra PA-C, please call pt to advise pt regarding skin toxicity with [place]. Pt will need to be sure to wear long sleeves, hats, sunglasses, sunscreen and pants. I have called the pt and advised as indicated. Pt expressed understanding of this information.

## 2021-03-11 NOTE — Progress Notes (Signed)
Per Cassie Heilingoetter PA-C, ok to treat using labs from 03/03/2021.

## 2021-03-12 ENCOUNTER — Inpatient Hospital Stay: Payer: Medicare Other

## 2021-03-12 ENCOUNTER — Other Ambulatory Visit: Payer: Self-pay

## 2021-03-12 VITALS — BP 145/78 | HR 94 | Temp 98.0°F | Resp 18

## 2021-03-12 DIAGNOSIS — Z79899 Other long term (current) drug therapy: Secondary | ICD-10-CM | POA: Diagnosis not present

## 2021-03-12 DIAGNOSIS — C3491 Malignant neoplasm of unspecified part of right bronchus or lung: Secondary | ICD-10-CM

## 2021-03-12 DIAGNOSIS — C7931 Secondary malignant neoplasm of brain: Secondary | ICD-10-CM | POA: Diagnosis not present

## 2021-03-12 DIAGNOSIS — C342 Malignant neoplasm of middle lobe, bronchus or lung: Secondary | ICD-10-CM | POA: Diagnosis not present

## 2021-03-12 DIAGNOSIS — Z5111 Encounter for antineoplastic chemotherapy: Secondary | ICD-10-CM | POA: Diagnosis not present

## 2021-03-12 DIAGNOSIS — Z923 Personal history of irradiation: Secondary | ICD-10-CM | POA: Diagnosis not present

## 2021-03-12 MED ORDER — ONDANSETRON HCL 4 MG/2ML IJ SOLN
8.0000 mg | Freq: Once | INTRAMUSCULAR | Status: AC
  Administered 2021-03-12: 8 mg via INTRAVENOUS

## 2021-03-12 MED ORDER — ONDANSETRON HCL 4 MG/2ML IJ SOLN
INTRAMUSCULAR | Status: AC
  Filled 2021-03-12: qty 4

## 2021-03-12 MED ORDER — MEPERIDINE HCL 25 MG/ML IJ SOLN
12.5000 mg | Freq: Once | INTRAMUSCULAR | Status: AC
  Administered 2021-03-12: 12.5 mg via INTRAVENOUS

## 2021-03-12 MED ORDER — SODIUM CHLORIDE 0.9 % IV SOLN
10.0000 mg | Freq: Once | INTRAVENOUS | Status: AC
  Administered 2021-03-12: 10 mg via INTRAVENOUS
  Filled 2021-03-12: qty 10

## 2021-03-12 MED ORDER — METHYLPREDNISOLONE SODIUM SUCC 125 MG IJ SOLR
125.0000 mg | Freq: Once | INTRAMUSCULAR | Status: AC | PRN
  Administered 2021-03-12: 125 mg via INTRAVENOUS

## 2021-03-12 MED ORDER — DIPHENHYDRAMINE HCL 25 MG PO CAPS
ORAL_CAPSULE | ORAL | Status: AC
  Filled 2021-03-12: qty 2

## 2021-03-12 MED ORDER — MEPERIDINE HCL 25 MG/ML IJ SOLN
INTRAMUSCULAR | Status: AC
  Filled 2021-03-12: qty 1

## 2021-03-12 MED ORDER — FAMOTIDINE 20 MG IN NS 100 ML IVPB
20.0000 mg | Freq: Once | INTRAVENOUS | Status: AC | PRN
  Administered 2021-03-12: 20 mg via INTRAVENOUS

## 2021-03-12 MED ORDER — SODIUM CHLORIDE 0.9 % IV SOLN
Freq: Once | INTRAVENOUS | Status: AC
  Filled 2021-03-12: qty 250

## 2021-03-12 MED ORDER — ALBUTEROL SULFATE (2.5 MG/3ML) 0.083% IN NEBU
2.5000 mg | INHALATION_SOLUTION | Freq: Once | RESPIRATORY_TRACT | Status: AC | PRN
  Administered 2021-03-12: 2.5 mg via RESPIRATORY_TRACT
  Filled 2021-03-12: qty 3

## 2021-03-12 MED ORDER — EPINEPHRINE 0.3 MG/0.3ML IJ SOAJ: 0.3000 mg | Freq: Once | INTRAMUSCULAR | Status: DC | PRN

## 2021-03-12 MED ORDER — DIPHENHYDRAMINE HCL 50 MG/ML IJ SOLN
50.0000 mg | Freq: Once | INTRAMUSCULAR | Status: AC | PRN
  Administered 2021-03-12: 25 mg via INTRAVENOUS

## 2021-03-12 MED ORDER — SODIUM CHLORIDE 0.9 % IV SOLN
Freq: Once | INTRAVENOUS | Status: DC | PRN
  Filled 2021-03-12: qty 250

## 2021-03-12 MED ORDER — SODIUM CHLORIDE 0.9 % IV SOLN: 8.0000 mg | Freq: Once | INTRAVENOUS | Status: DC

## 2021-03-12 MED ORDER — DIPHENHYDRAMINE HCL 25 MG PO CAPS
50.0000 mg | ORAL_CAPSULE | Freq: Once | ORAL | Status: AC
  Administered 2021-03-12: 50 mg via ORAL

## 2021-03-12 MED ORDER — ACETAMINOPHEN 325 MG PO TABS
650.0000 mg | ORAL_TABLET | Freq: Once | ORAL | Status: AC
  Administered 2021-03-12: 650 mg via ORAL

## 2021-03-12 MED ORDER — SODIUM CHLORIDE 0.9 % IV SOLN
350.0000 mg | Freq: Once | INTRAVENOUS | Status: AC
  Administered 2021-03-12: 350 mg via INTRAVENOUS
  Filled 2021-03-12: qty 7

## 2021-03-12 MED ORDER — ACETAMINOPHEN 325 MG PO TABS
ORAL_TABLET | ORAL | Status: AC
  Filled 2021-03-12: qty 2

## 2021-03-12 NOTE — Progress Notes (Signed)
Patient receiving Amivantamab for the first time today. Received tylenol 650mg  po, benadryl 50mg  po,zofran 8mg  iv and Decadron 10mg  iv before treatment start. Waited one hour after steriod before treatment start. Medication was started at 0924. At [phone] patient started turning red in the face and weezing. Medication was stopped fluid bolius was given. 0957 solu-medrol 125mg  given, pepcid 20mg  iv given at 1000, 1003 benaryl iv 25mg  given, vitals initially at 0956 bp 150/73, pulse 97, o2 dropped bellow 90. Oxygen 3l given and brought o2 level to 100%. Albuterol given as well. Redness resolved and patient began to breath better. Gave some more fluids and waited 30 minutes.  Restarted medication at 50% of initial rate 57ml/hr. 6 minutes in to treatment patient reported being really cold. Started having rigors. Infusion was stopped. Demerol 12.5 mg given. Patient returned to baseline  per MD restart treatment at 50% reduced rate restarted treatment at 1136 at 12.27ml for 30 minutes 1208 bumped rate up to 13ml for 30 minutes. Patient tolerated that well so we increased the dose to 82ml/hr for 30 minutes. After 30 minutes patinet still tolerating treatment well. Contacted provider and increased medication to 53ml/hr for the rest of his treatment.

## 2021-03-12 NOTE — Patient Instructions (Addendum)
[facility]  Discharge Instructions: Thank you for choosing [facility] to provide your oncology and hematology care.   If you have a lab appointment with the [facility], please go directly to the [facility] and check in at the registration area.   Wear comfortable clothing and clothing appropriate for easy access to any Portacath or PICC line.   We strive to give you quality time with your provider. You may need to reschedule your appointment if you arrive late (15 or more minutes).  Arriving late affects you and other patients whose appointments are after yours.  Also, if you miss three or more appointments without notifying the office, you may be dismissed from the clinic at the provider's discretion.      For prescription refill requests, have your pharmacy contact our office and allow 72 hours for refills to be completed.    Today you received the following chemotherapy and/or immunotherapy agents Rybrevant    To help prevent nausea and vomiting after your treatment, we encourage you to take your nausea medication as directed.  BELOW ARE SYMPTOMS THAT SHOULD BE REPORTED IMMEDIATELY: *FEVER GREATER THAN 100.4 F (38 C) OR HIGHER *CHILLS OR SWEATING *NAUSEA AND VOMITING THAT IS NOT CONTROLLED WITH YOUR NAUSEA MEDICATION *UNUSUAL SHORTNESS OF BREATH *UNUSUAL BRUISING OR BLEEDING *URINARY PROBLEMS (pain or burning when urinating, or frequent urination) *BOWEL PROBLEMS (unusual diarrhea, constipation, pain near the anus) TENDERNESS IN MOUTH AND THROAT WITH OR WITHOUT PRESENCE OF ULCERS (sore throat, sores in mouth, or a toothache) UNUSUAL RASH, SWELLING OR PAIN  UNUSUAL VAGINAL DISCHARGE OR ITCHING   Items with * indicate a potential emergency and should be followed up as soon as possible or go to the Emergency Department if any problems should occur.  Please show the CHEMOTHERAPY ALERT CARD or IMMUNOTHERAPY ALERT CARD at check-in to the  Emergency Department and triage nurse.  Should you have questions after your visit or need to cancel or reschedule your appointment, please contact [facility]  Dept: [phone]  and follow the prompts.  Office hours are 8:00 a.m. to 4:30 p.m. Monday - Friday. Please note that voicemails left after 4:00 p.m. may not be returned until the following business day.  We are closed weekends and major holidays. You have access to a nurse at all times for urgent questions. Please call the main number to the clinic Dept: [phone] and follow the prompts.   For any non-urgent questions, you may also contact your provider using MyChart. We now offer e-Visits for anyone 43 and older to request care online for non-urgent symptoms. For details visit [email or web address].   Also download the MyChart app! Go to the app store, search "MyChart", open the app, select [facility], and log in with your MyChart username and password.  Due to Covid, a mask is required upon entering the hospital/clinic. If you do not have a mask, one will be given to you upon arrival. For doctor visits, patients may have 1 support person aged 33 or older with them. For treatment visits, patients cannot have anyone with them due to current Covid guidelines and our immunocompromised population. Amivantamab Injection What is this medication?    AMIVANTAMAB (AM i VAN ta mab) is a bispecific antibody that targets twoproteins that help cancer cells grow. It treats certain types of lung cancer. This medicine may be used for other purposes; ask your health care provider orpharmacist if you have questions. COMMON  BRAND NAME(S): Rybrevant What should I tell my care team before I take this medication? They need to know if you have any of these conditions: eye disease lung disease an unusual or allergic reaction to amivantamab, other medicines, foods, dyes, or preservatives pregnant or trying to get  pregnant breast-feeding How should I use this medication? This medicine is injected into a vein. It is given by a health care provider ina hospital or clinic setting. Overdosage: If you think you have taken too much of this medicine contact apoison control center or emergency room at once. NOTE: This medicine is only for you. Do not share this medicine with others. What if I miss a dose? Keep appointments for follow-up doses. It is important not to miss your dose.Call your health care provider if you are unable to keep an appointment. What may interact with this medication? Interactions have not been studied. This list may not describe all possible interactions. Give your health care provider a list of all the medicines, herbs, non-prescription drugs, or dietary supplements you use. Also tell them if you smoke, drink alcohol, or use illegaldrugs. Some items may interact with your medicine. What should I watch for while using this medication? Your condition will be monitored carefully while you are receiving thismedicine. You may need blood work while you are taking this medicine. Tell your health care provider right away if you have any change in youreyesight. This medicine can make you more sensitive to the sun. Keep out of the sun. If you cannot avoid being in the sun, wear protective clothing and sunscreen. Donot use sun lamps or tanning beds/booths. This medicine may increase your risk of getting an infection. Call your health care provider for advice if you get a fever, chills, sore throat, or other symptoms of a cold or flu. Do not treat yourself. Try to avoid being aroundpeople who are sick. Do not become pregnant while taking this medicine or for 3 months after stopping it. Women should inform their health care provider if they wish to become pregnant or think they might be pregnant. There is potential for serious harm to an unborn child. Talk to your health care provider for more  information. Do not breast-feed an infant while taking this medicine or for 24months after stopping it. What side effects may I notice from receiving this medication? Side effects that you should report to your doctor or health care professionalas soon as possible: allergic reactions (skin rash, itching or hives; swelling of the face, lips, or tongue) blurred vision OR changes in vision cough dry eyes eye irritation, itching eye pain facial flushing (redness) fever infection (fever, chills, cough, sore throat, pain or trouble passing urine) low blood pressure (dizziness; feeling faint or lightheaded, falls; unusually weak or tired) trouble breathing Side effects that usually do not require medical attention (report these toyour doctor or health care professional if they continue or are bothersome): dry skin edema (sudden weight gain; swelling of the ankles, feet, hands or other unusual swelling) joint pain mouth sores muscle cramps, pain nausea vomiting This list may not describe all possible side effects. Call your doctor for medical advice about side effects. You may report side effects to FDA at1-800-FDA-1088. Where should I keep my medication? This medicine is given in a hospital or clinic. It will not be stored at home. NOTE: This sheet is a summary. It may not cover all possible information. If you have questions about this medicine, talk to your doctor, pharmacist, orhealth care  provider.  2022 Elsevier/Gold Standard (2020-01-17 14:10:37)

## 2021-03-13 ENCOUNTER — Inpatient Hospital Stay: Payer: Medicare Other

## 2021-03-13 VITALS — BP 142/78 | HR 92 | Temp 98.8°F | Resp 20

## 2021-03-13 DIAGNOSIS — Z79899 Other long term (current) drug therapy: Secondary | ICD-10-CM | POA: Diagnosis not present

## 2021-03-13 DIAGNOSIS — Z923 Personal history of irradiation: Secondary | ICD-10-CM | POA: Diagnosis not present

## 2021-03-13 DIAGNOSIS — C342 Malignant neoplasm of middle lobe, bronchus or lung: Secondary | ICD-10-CM | POA: Diagnosis not present

## 2021-03-13 DIAGNOSIS — C3491 Malignant neoplasm of unspecified part of right bronchus or lung: Secondary | ICD-10-CM

## 2021-03-13 DIAGNOSIS — C7931 Secondary malignant neoplasm of brain: Secondary | ICD-10-CM | POA: Diagnosis not present

## 2021-03-13 DIAGNOSIS — Z5111 Encounter for antineoplastic chemotherapy: Secondary | ICD-10-CM | POA: Diagnosis not present

## 2021-03-13 MED ORDER — ACETAMINOPHEN 325 MG PO TABS
650.0000 mg | ORAL_TABLET | Freq: Once | ORAL | Status: AC
  Administered 2021-03-13: 650 mg via ORAL

## 2021-03-13 MED ORDER — ONDANSETRON HCL 4 MG/2ML IJ SOLN
INTRAMUSCULAR | Status: AC
  Filled 2021-03-13: qty 4

## 2021-03-13 MED ORDER — FAMOTIDINE 20 MG IN NS 100 ML IVPB
20.0000 mg | Freq: Once | INTRAVENOUS | Status: AC
  Administered 2021-03-13: 20 mg via INTRAVENOUS

## 2021-03-13 MED ORDER — SODIUM CHLORIDE 0.9 % IV SOLN
1050.0000 mg | Freq: Once | INTRAVENOUS | Status: AC
  Administered 2021-03-13: 1050 mg via INTRAVENOUS
  Filled 2021-03-13: qty 21

## 2021-03-13 MED ORDER — DIPHENHYDRAMINE HCL 25 MG PO CAPS
50.0000 mg | ORAL_CAPSULE | Freq: Once | ORAL | Status: AC
  Administered 2021-03-13: 50 mg via ORAL

## 2021-03-13 MED ORDER — SODIUM CHLORIDE 0.9 % IV SOLN
8.0000 mg | Freq: Once | INTRAVENOUS | Status: DC
  Filled 2021-03-13: qty 4

## 2021-03-13 MED ORDER — DIPHENHYDRAMINE HCL 25 MG PO CAPS
ORAL_CAPSULE | ORAL | Status: AC
  Filled 2021-03-13: qty 2

## 2021-03-13 MED ORDER — ONDANSETRON HCL 4 MG/2ML IJ SOLN
8.0000 mg | Freq: Once | INTRAMUSCULAR | Status: AC
  Administered 2021-03-13: 8 mg via INTRAVENOUS

## 2021-03-13 MED ORDER — FAMOTIDINE 20 MG IN NS 100 ML IVPB
INTRAVENOUS | Status: AC
  Filled 2021-03-13: qty 100

## 2021-03-13 MED ORDER — SODIUM CHLORIDE 0.9 % IV SOLN
Freq: Once | INTRAVENOUS | Status: AC
  Filled 2021-03-13: qty 250

## 2021-03-13 MED ORDER — SODIUM CHLORIDE 0.9 % IV SOLN
10.0000 mg | Freq: Once | INTRAVENOUS | Status: AC
  Administered 2021-03-13: 10 mg via INTRAVENOUS
  Filled 2021-03-13: qty 10

## 2021-03-13 MED ORDER — ACETAMINOPHEN 325 MG PO TABS
ORAL_TABLET | ORAL | Status: AC
  Filled 2021-03-13: qty 2

## 2021-03-13 NOTE — Patient Instructions (Signed)
[facility]COL[facility]   Discharge Instructions: Thank you for choosing [facility] to provide your oncology and hematology care.   If you have a lab appointment with the [facility], please go directly to the [facility] and check in at the registration area.   Wear comfortable clothing and clothing appropriate for easy access to any Portacath or PICC line.   We strive to give you quality time with your provider. You may need to reschedule your appointment if you arrive late (15 or more minutes).  Arriving late affects you and other patients whose appointments are after yours.  Also, if you miss three or more appointments without notifying the office, you may be dismissed from the clinic at the provider's discretion.      For prescription refill requests, have your pharmacy contact our office and allow 72 hours for refills to be completed.    Today you received the following chemotherapy and/or immunotherapy agents: amivantamab.      To help prevent nausea and vomiting after your treatment, we encourage you to take your nausea medication as directed.  BELOW ARE SYMPTOMS THAT SHOULD BE REPORTED IMMEDIATELY: *FEVER GREATER THAN 100.4 F (38 C) OR HIGHER *CHILLS OR SWEATING *NAUSEA AND VOMITING THAT IS NOT CONTROLLED WITH YOUR NAUSEA MEDICATION *UNUSUAL SHORTNESS OF BREATH *UNUSUAL BRUISING OR BLEEDING *URINARY PROBLEMS (pain or burning when urinating, or frequent urination) *BOWEL PROBLEMS (unusual diarrhea, constipation, pain near the anus) TENDERNESS IN MOUTH AND THROAT WITH OR WITHOUT PRESENCE OF ULCERS (sore throat, sores in mouth, or a toothache) UNUSUAL RASH, SWELLING OR PAIN  UNUSUAL VAGINAL DISCHARGE OR ITCHING   Items with * indicate a potential emergency and should be followed up as soon as possible or go to the Emergency Department if any problems should occur.  Please show the CHEMOTHERAPY ALERT CARD or IMMUNOTHERAPY ALERT CARD at  check-in to the Emergency Department and triage nurse.  Should you have questions after your visit or need to cancel or reschedule your appointment, please contact [facility]  Dept: [phone]  and follow the prompts.  Office hours are 8:00 a.m. to 4:30 p.m. Monday - Friday. Please note that voicemails left after 4:00 p.m. may not be returned until the following business day.  We are closed weekends and major holidays. You have access to a nurse at all times for urgent questions. Please call the main number to the clinic Dept: [phone] and follow the prompts.   For any non-urgent questions, you may also contact your provider using MyChart. We now offer e-Visits for anyone 18 and older to request care online for non-urgent symptoms. For details visit [email or web address].   Also download the MyChart app! Go to the app store, search "MyChart", open the app, select [facility], and log in with your MyChart username and password.  Due to Covid, a mask is required upon entering the hospital/clinic. If you do not have a mask, one will be given to you upon arrival. For doctor visits, patients may have 1 support person aged 65 or older with them. For treatment visits, patients cannot have anyone with them due to current Covid guidelines and our immunocompromised population.

## 2021-03-13 NOTE — Progress Notes (Signed)
Due to patient's hypersensitivity reaction on 03/12/2021, rate of administration decreased and titrated.

## 2021-03-14 ENCOUNTER — Telehealth: Payer: Self-pay

## 2021-03-14 ENCOUNTER — Emergency Department (HOSPITAL_COMMUNITY)
Admission: EM | Admit: 2021-03-14 | Discharge: 2021-03-14 | Disposition: A | Discharge status: Home / Self Care | Payer: Medicare Other | Attending: Emergency Medicine | Admitting: Emergency Medicine

## 2021-03-14 ENCOUNTER — Emergency Department (HOSPITAL_COMMUNITY): Payer: Medicare Other

## 2021-03-14 ENCOUNTER — Ambulatory Visit (HOSPITAL_COMMUNITY)
Admission: RE | Admit: 2021-03-14 | Discharge: 2021-03-14 | Disposition: A | Discharge status: Home / Self Care | Payer: Medicare Other | Source: Ambulatory Visit | Attending: Physician Assistant | Admitting: Physician Assistant

## 2021-03-14 ENCOUNTER — Other Ambulatory Visit: Payer: Self-pay

## 2021-03-14 ENCOUNTER — Encounter (HOSPITAL_COMMUNITY): Payer: Self-pay

## 2021-03-14 DIAGNOSIS — J90 Pleural effusion, not elsewhere classified: Secondary | ICD-10-CM | POA: Diagnosis not present

## 2021-03-14 DIAGNOSIS — R531 Weakness: Secondary | ICD-10-CM | POA: Insufficient documentation

## 2021-03-14 DIAGNOSIS — C3491 Malignant neoplasm of unspecified part of right bronchus or lung: Secondary | ICD-10-CM

## 2021-03-14 DIAGNOSIS — Z85118 Personal history of other malignant neoplasm of bronchus and lung: Secondary | ICD-10-CM | POA: Diagnosis not present

## 2021-03-14 DIAGNOSIS — R600 Localized edema: Secondary | ICD-10-CM | POA: Diagnosis not present

## 2021-03-14 DIAGNOSIS — R410 Disorientation, unspecified: Secondary | ICD-10-CM

## 2021-03-14 DIAGNOSIS — I10 Essential (primary) hypertension: Secondary | ICD-10-CM | POA: Diagnosis not present

## 2021-03-14 DIAGNOSIS — R000 Tachycardia, unspecified: Secondary | ICD-10-CM | POA: Diagnosis not present

## 2021-03-14 DIAGNOSIS — R4182 Altered mental status, unspecified: Secondary | ICD-10-CM | POA: Diagnosis not present

## 2021-03-14 DIAGNOSIS — Z79899 Other long term (current) drug therapy: Secondary | ICD-10-CM | POA: Diagnosis not present

## 2021-03-14 DIAGNOSIS — G9389 Other specified disorders of brain: Secondary | ICD-10-CM | POA: Diagnosis not present

## 2021-03-14 LAB — URINALYSIS, ROUTINE W REFLEX MICROSCOPIC
Bacteria, UA: NONE SEEN
Bilirubin Urine: NEGATIVE
Glucose, UA: NEGATIVE mg/dL
Ketones, ur: NEGATIVE mg/dL
Leukocytes,Ua: NEGATIVE
Nitrite: NEGATIVE
Protein, ur: NEGATIVE mg/dL
Specific Gravity, Urine: 1.02 (ref 1.005–1.030)
pH: 5 (ref 5.0–8.0)

## 2021-03-14 LAB — CBC WITH DIFFERENTIAL/PLATELET
Abs Immature Granulocytes: 0.02 10*3/uL (ref 0.00–0.07)
Basophils Absolute: 0 10*3/uL (ref 0.0–0.1)
Basophils Relative: 0 %
Eosinophils Absolute: 0 10*3/uL (ref 0.0–0.5)
Eosinophils Relative: 0 %
HCT: 36.5 % — ABNORMAL LOW (ref 39.0–52.0)
Hemoglobin: 12.2 g/dL — ABNORMAL LOW (ref 13.0–17.0)
Immature Granulocytes: 0 %
Lymphocytes Relative: 9 %
Lymphs Abs: 0.5 10*3/uL — ABNORMAL LOW (ref 0.7–4.0)
MCH: 31.6 pg (ref 26.0–34.0)
MCHC: 33.4 g/dL (ref 30.0–36.0)
MCV: 94.6 fL (ref 80.0–100.0)
Monocytes Absolute: 0.5 10*3/uL (ref 0.1–1.0)
Monocytes Relative: 8 %
Neutro Abs: 5 10*3/uL (ref 1.7–7.7)
Neutrophils Relative %: 83 %
Platelets: 143 10*3/uL — ABNORMAL LOW (ref 150–400)
RBC: 3.86 MIL/uL — ABNORMAL LOW (ref 4.22–5.81)
RDW: 13.8 % (ref 11.5–15.5)
WBC: 6.1 10*3/uL (ref 4.0–10.5)
nRBC: 0 % (ref 0.0–0.2)

## 2021-03-14 LAB — COMPREHENSIVE METABOLIC PANEL
ALT: 21 U/L (ref 0–44)
AST: 19 U/L (ref 15–41)
Albumin: 2.7 g/dL — ABNORMAL LOW (ref 3.5–5.0)
Alkaline Phosphatase: 141 U/L — ABNORMAL HIGH (ref 38–126)
Anion gap: 8 (ref 5–15)
BUN: 23 mg/dL (ref 8–23)
CO2: 26 mmol/L (ref 22–32)
Calcium: 8.4 mg/dL — ABNORMAL LOW (ref 8.9–10.3)
Chloride: 105 mmol/L (ref 98–111)
Creatinine, Ser: 1.03 mg/dL (ref 0.61–1.24)
GFR, Estimated: >60 mL/min (ref >60)
Glucose, Bld: 102 mg/dL — ABNORMAL HIGH (ref 70–99)
Potassium: 4 mmol/L (ref 3.5–5.1)
Sodium: 139 mmol/L (ref 135–145)
Total Bilirubin: 0.4 mg/dL (ref 0.3–1.2)
Total Protein: 5.8 g/dL — ABNORMAL LOW (ref 6.5–8.1)

## 2021-03-14 MED ORDER — DOXYCYCLINE HYCLATE 100 MG PO CAPS: 100.0000 mg | ORAL_CAPSULE | Freq: Two times a day (BID) | ORAL | 0 refills | Status: DC

## 2021-03-14 MED ORDER — DOXYCYCLINE HYCLATE 100 MG PO TABS
100.0000 mg | ORAL_TABLET | Freq: Once | ORAL | Status: AC
  Administered 2021-03-14: 100 mg via ORAL
  Filled 2021-03-14: qty 1

## 2021-03-14 NOTE — Telephone Encounter (Signed)
At approximately 1439p, patient's wife called with concern about husband's menetation.  She stated he got 2 chemo infusions Wed/Thurs.  Last night, he woke up and was coughing.  She gave him Tylenol and he went back to sleep.  Now he is hard to respond to her.  He answers yes/no but cannot tell her what her name is and is hard to arouse.  She says he says no when she asks if he wants to go to the hospital.    Advised her to take him to the ER or call 911 so he can be further evaluated.

## 2021-03-14 NOTE — ED Provider Notes (Signed)
[facility] DEPT Provider Note   CSN: [id number] Arrival date & time: 03/14/21  1521     History Chief Complaint  Patient presents with   Altered Mental Status    Ethan Brown is a 70 y.o. male.  The history is provided by the patient, medical records and the spouse.  Altered Mental Status Ethan Brown is a 70 y.o. male who presents to the Emergency Department complaining of altered mental status. Level V caveat due to confusion. History is provided by the patient's wife. He has a history of stage IV non-small cell carcinoma of the lung with known brain mets. He received amivantamab yesterday. Today he was due for a thoracentesis of the lung for recurrent pleural effusion. Wife states that he has seemed confused all day. Initially they went to the wrong appointment time and he did miss the appointment. She states when he got home he was fatigued and need to rest. Then she began to ask him questions and he seemed confused. He did not know their address. He did not know her name. He does have brief periods of confusion but this is severe for him. No fevers. He has a constant cough and this is at his baseline. No vomiting, diarrhea, dysuria. Patient denies any complaints and states he does not currently feel confused or unwell.    Past Medical History:  Diagnosis Date   Borderline hypertension    Brain metastasis ([place]) dx'd 01/2019   Detached vitreous humor, left    Dyspnea    ED (erectile dysfunction) of organic origin    Hyperlipidemia    Hypertension    Lens subluxation, left    Lung cancer ([place]) dx'd 01/2019   Stage IV   Migraine headache    takes PRN Imitrex   Pseudophakia    TGA (transient global amnesia) 2017    Patient Active Problem List   Diagnosis Date Noted   Bone metastases ([place]) 03/06/2021   Right hip pain 03/03/2021   Bifascicular bundle branch block 08/28/2020   Hypomagnesemia 07/29/2020   Drug-induced skin rash  07/15/2020   Shortness of breath 03/12/2020   Bilateral lower extremity edema 02/22/2020   Port-A-Cath in place 04/25/2019   Brain metastasis ([place]) 04/07/2019   Malignant pleural effusion 03/29/2019   Primary malignant neoplasm of lung with metastasis to brain ([place]) 03/28/2019   Adenocarcinoma of right lung, stage 4 ([place]) 03/15/2019   Encounter for antineoplastic chemotherapy 03/15/2019   Encounter for antineoplastic immunotherapy 03/15/2019   Goals of care, counseling/discussion 03/15/2019   Recurrent right pleural effusion 03/15/2019   Pleural effusion 02/20/2019   Preop cardiovascular exam 02/20/2019   Pulmonary nodule, right 02/16/2019   Chronic cough 02/16/2019   B12 deficiency 11/24/2018   Neuropathy, peripheral 11/24/2018   Migraines 06/14/2018   Retinal defect, left 06/14/2018   Borderline hypertension 11/04/2017    Past Surgical History:  Procedure Laterality Date   CAROTID DOPPLERS  09/2017   Mild non-obstructive disease   CATARACT EXTRACTION, BILATERAL     cataract, left  2018   CHEST TUBE INSERTION Right 03/29/2019   Procedure: INSERTION PLEURAL DRAINAGE CATHETER;  Surgeon: [name], MD;  Location: [facility] OR;  Service: Thoracic;  Laterality: Right;   COLONOSCOPY  2016   clear, repeat in 10 yrs    Eye surgeries     , Lens attachment.  Vitrectomy   FEMORAL HERNIA REPAIR     HERNIA REPAIR     IR THORACENTESIS ASP PLEURAL SPACE  W/IMG GUIDE  02/22/2019   IR THORACENTESIS ASP PLEURAL SPACE W/IMG GUIDE  03/13/2019   IR THORACENTESIS ASP PLEURAL SPACE W/IMG GUIDE  03/18/2020   LUMBAR LAMINECTOMY     PLEURAL BIOPSY Right 03/29/2019   Procedure: PLEURAL BIOPSY;  Surgeon: [name], MD;  Location: [place];  Service: Thoracic;  Laterality: Right;   PORTACATH PLACEMENT N/A 03/29/2019   Procedure: INSERTION PORT-A-CATH;  Surgeon: [name], MD;  Location: [place];  Service: Thoracic;  Laterality: N/A;   retinal attachment, right     sclearl  buckle, right     TRANSTHORACIC ECHOCARDIOGRAM  12/14/2019   EF 50 EF 55% (low normal).  Abnormal septal wall motion related to BBB; indeterminate diastolic parameters.  Mild RV enlargement with mildly reduced function.  Normal valves.  Normal atrial sizes.   victrectomy,right     VIDEO ASSISTED THORACOSCOPY Right 03/29/2019   Procedure: VIDEO ASSISTED THORACOSCOPY;  Surgeon: [name], MD;  Location: [facility] OR;  Service: Thoracic;  Laterality: Right;       Family History  Problem Relation Age of Onset   Alcohol abuse Mother    Diabetes Father    Hypertension Father    Prostate cancer Father    Cancer Father    Heart attack Maternal Grandfather 78    Social History   Tobacco Use   Smoking status: Never   Smokeless tobacco: Never  Vaping Use   Vaping Use: Never used  Substance Use Topics   Alcohol use: Yes    Alcohol/week: 1.0 standard drink    Types: 1 Glasses of wine per week   Drug use: No    Home Medications Prior to Admission medications   Medication Sig Start Date End Date Taking? Authorizing Provider  cyanocobalamin (,VITAMIN B-12,) 1000 MCG/ML injection INJECT 1 ML (1,000 MCG TOTAL) INTO THE MUSCLE ONCE FOR 1 DOSE. Patient taking differently: Inject 1,000 mcg into the skin every 30 (thirty) days. 12/26/19  Yes Laurey Morale, MD  cycloSPORINE (RESTASIS) 0.05 % ophthalmic emulsion Place 1 drop into both eyes 2 (two) times daily.   Yes [provider]  doxycycline (VIBRAMYCIN) 100 MG capsule Take 1 capsule (100 mg total) by mouth 2 (two) times daily. 03/14/21  Yes Quintella Reichert, MD  hydrochlorothiazide (MICROZIDE) 12.5 MG capsule TAKE 1 CAPSULE BY MOUTH EVERY DAY Patient taking differently: Take 12.5 mg by mouth every other day. 01/06/21  Yes Leonie Man, MD  HYDROcodone bit-homatropine [facility]) 5-1.5 MG/5ML syrup Take 5 mLs by mouth every 6 (six) hours as needed for cough. 02/27/21  Yes Heilingoetter, Cassandra L, PA-C  lidocaine-prilocaine (EMLA)  cream Apply to the Port-A-Cath site 30-60 minutes before treatment Patient taking differently: Apply 1 application topically daily as needed. Apply to the Port-A-Cath site 30-60 minutes before treatment 03/28/19  Yes Curt Bears, MD  LORazepam (ATIVAN) 1 MG tablet Take 1 tablet (1 mg total) by mouth as needed for anxiety (30 minutes prior to radiation treatment and may repeat just prior to procedure if needed.). Patient taking differently: Take 1 mg by mouth daily. 06/25/20  Yes Bruning, Ashlyn, PA-C  memantine (NAMENDA) 10 MG tablet TAKE 1 TABLET BY MOUTH TWICE A DAY Patient taking differently: Take 10 mg by mouth 2 (two) times daily. 01/07/21  Yes Bruning, Ashlyn, PA-C  mirtazapine (REMERON) 15 MG tablet Take 1 tablet (15 mg total) by mouth at bedtime. 02/25/21  Yes Heilingoetter, Cassandra L, PA-C  prochlorperazine (COMPAZINE) 10 MG tablet Take 1 tablet (10 mg  total) by mouth every 6 (six) hours as needed for nausea or vomiting. 05/21/20  Yes Curt Bears, MD  sildenafil (REVATIO) 20 MG tablet TAKE AS DIRECTED Patient taking differently: 20 mg. Take as directed 08/15/19  Yes Laurey Morale, MD  tamsulosin (FLOMAX) 0.4 MG CAPS capsule Take 0.4 mg by mouth daily. 12/22/19  Yes [provider]  hydrocortisone 1 % lotion Apply 1 application topically 2 (two) times daily. Patient not taking: No sig reported 07/15/20   Heilingoetter, Cassandra L, PA-C  lidocaine (XYLOCAINE) 2 % solution Use as directed 5 mLs in the mouth or throat every 3 (three) hours as needed for mouth pain. Gargle and spit Patient not taking: No sig reported 12/31/20   Harle Stanford., PA-C  magic mouthwash SOLN Take 5 mLs by mouth 4 (four) times daily as needed for mouth pain. Swish and spit or swallow Patient not taking: No sig reported 01/14/21   Heilingoetter, Cassandra L, PA-C  osimertinib mesylate (TAGRISSO) 80 MG tablet Take 1 tablet (80 mg total) by mouth daily. Patient not taking: No sig reported 12/25/20   Curt Bears, MD    Allergies    Patient has no known allergies.  Review of Systems   Review of Systems  All other systems reviewed and are negative.  Physical Exam Updated Vital Signs BP 138/85 (BP Location: Right Arm)   Pulse 98   Temp 98 F (36.7 C) (Oral)   Resp (!) 22   Ht 5\' 11"  (1.803 m)   Wt 79.4 kg   SpO2 96%   BMI 24.41 kg/m   Physical Exam Vitals and nursing note reviewed.  Constitutional:      Appearance: He is well-developed.  HENT:     Head: Normocephalic and atraumatic.  Cardiovascular:     Rate and Rhythm: Regular rhythm. Tachycardia present.     Heart sounds: No murmur heard. Pulmonary:     Effort: Pulmonary effort is normal. No respiratory distress.     Breath sounds: Normal breath sounds.  Abdominal:     Palpations: Abdomen is soft.     Tenderness: There is no abdominal tenderness. There is no guarding or rebound.  Musculoskeletal:        General: No tenderness.     Comments: Trace edema to bilateral lower extremities  Skin:    General: Skin is warm and dry.  Neurological:     Mental Status: He is alert.     Comments: Generalized weakness. Confused. He does know his wife's name but does not know the word wife and keeps calling her mother. He does not know his address. He is oriented to place in time.  Psychiatric:        Behavior: Behavior normal.    ED Results / Procedures / Treatments   Labs (all labs ordered are listed, but only abnormal results are displayed) Labs Reviewed  CBC WITH DIFFERENTIAL/PLATELET - Abnormal; Notable for the following components:      Result Value   RBC 3.86 (*)    Hemoglobin 12.2 (*)    HCT 36.5 (*)    Platelets 143 (*)    Lymphs Abs 0.5 (*)    All other components within normal limits  COMPREHENSIVE METABOLIC PANEL - Abnormal; Notable for the following components:   Glucose, Bld 102 (*)    Calcium 8.4 (*)    Total Protein 5.8 (*)    Albumin 2.7 (*)    Alkaline Phosphatase 141 (*)    All other components  within normal limits  URINALYSIS, ROUTINE W REFLEX MICROSCOPIC - Abnormal; Notable for the following components:   Hgb urine dipstick MODERATE (*)    All other components within normal limits    EKG None  Radiology CT Head Wo Contrast  Result Date: 03/14/2021 CLINICAL DATA:  Delirium; history of metastatic lung cancer. EXAM: CT HEAD WITHOUT CONTRAST TECHNIQUE: Contiguous axial images were obtained from the base of the skull through the vertex without intravenous contrast. COMPARISON:  December 05, 2020 FINDINGS: Brain: No evidence of acute infarction, hemorrhage or hydrocephalus. Known multifocal intracranial metastatic disease is not well visualized with current modality. Vascular: No hyperdense vessel or unexpected calcification. Skull: No acute fracture. Sinuses/Orbits: No acute finding. Other: None. IMPRESSION: No acute intracranial abnormality. Known intracranial metastatic disease is not well visualized on CT. Recommend further evaluation with dedicated MRI for improved comparison. Electronically Signed   By: Valentino Saxon MD   On: 03/14/2021 16:13   DG Chest Port 1 View  Result Date: 03/14/2021 CLINICAL DATA:  Altered mental status. suppose to have a thoracentesis today, but missed the appointment. Patient's wife reports that when she got home with the patient today, he would only stare at her. EXAM: PORTABLE CHEST 1 VIEW.  Patient rotated. COMPARISON:  CT 01/02/2021.  Chest x-ray 04/09/2020 FINDINGS: Accessed right chest wall Port-A-Cath with tip overlying the expected region of the distal superior vena cava. Prominent cardiac silhouette likely due to AP technique and rotation. Otherwise heart size and mediastinal contours are within normal limits. Fullness of the right hilum. Right lower mid lung zone hazy airspace opacity. No pulmonary edema. Persistent small to moderate volume right pleural effusion. No definite left pleural effusion. No pneumothorax. No acute osseous abnormality.  IMPRESSION: 1. Persistent small to moderate volume right pleural effusion. Associated right lower mid lung zone hazy airspace opacity. 2. Prominent cardiac silhouette likely due to AP technique and rotation. 3. Fullness of the right hilum. May be due to rotation versus underlying adenopathy/lesion. Electronically Signed   By: Iven Finn M.D.   On: 03/14/2021 16:47    Procedures Procedures   Medications Ordered in ED Medications  doxycycline (VIBRA-TABS) tablet 100 mg (100 mg Oral Given 03/14/21 1753)    ED Course  I have reviewed the triage vital signs and the nursing notes.  Pertinent labs & imaging results that were available during my care of the patient were reviewed by me and considered in my medical decision making (see chart for details).    MDM Rules/Calculators/A&P                          patient with metastatic non small cell carcinoma of the lung here for evaluation of altered mental status that started earlier today. He is confused but nonfocal on neurologic examination. Chest x-ray demonstrates stable pleural effusion with mid lung infiltrate, which was recently seen on CT chest - this is likely in correlation with a known mass. Labs are stable when compared to baseline. CT scan is without acute abnormality. Discussed with patient and wife recommendation for MRI to further evaluate his symptoms. Patient declines MRI at this time. Will start antibiotics for possible developing pneumonia. He is scheduled for outpatient thoracentesis on Monday as well as close follow-up with Dr. Earlie Server. Plan to discharge home with close outpatient follow-up and return precautions.  Final Clinical Impression(s) / ED Diagnoses Final diagnoses:  Disorientation    Rx / DC Orders ED Discharge Orders  Ordered    doxycycline (VIBRAMYCIN) 100 MG capsule  2 times daily        03/14/21 1745             Quintella Reichert, MD 03/14/21 [phone]

## 2021-03-14 NOTE — ED Notes (Signed)
Patient's wife is at the bedside and attentive to the patient.  The patient is alert and does answer questions when asked

## 2021-03-14 NOTE — Progress Notes (Signed)
Patient completed amivantamab infusion with no adverse events.

## 2021-03-14 NOTE — Telephone Encounter (Signed)
Per ultrasound, patient arrived late to scheduled thoracentesis and was therefore unable to get the procedure. Thoracentesis rescheduled for 7/25 at 1130am. Will make patient aware of new date and time.

## 2021-03-14 NOTE — ED Provider Notes (Signed)
Emergency Medicine Provider Triage Evaluation Note  Ethan Brown , a 70 y.o. male  was evaluated in triage.  Pt complains of altered mental status.  Apparently supposed have a thoracentesis earlier today however missed the appointment.  Patient recently started new medication for cancer on Wednesday and Thursday.  When patient's wife got home the patient was staring off into space and would not speak.  He would occasionally shake his head yes or no to answer questions however not speak at that time.  No facial droop, numbness, tingling, weakness.  Patient does have history of transient global amnesia.  No recent illnesses.  Review of Systems  Positive: Episode of staring off into space. Negative: Headache, weakness, numbness  Physical Exam  There were no vitals taken for this visit. Gen:   Awake, no distress   Resp:  Normal effort  MSK:   Moves extremities without difficulty  Neuro:  CN 2-12 grossly intact, normal speech, intact sensation bilaterally Equal strength bilateral Other:    Medical Decision Making  Medically screening exam initiated at 3:36 PM.  Appropriate orders placed.  Ethan Brown was informed that the remainder of the evaluation will be completed by another provider, this initial triage assessment does not replace that evaluation, and the importance of remaining in the ED until their evaluation is complete.  AMS  Patient is not a code stroke, does not meet LVO criteria.  Work-up started   Ethan Brown A, PA-C 03/14/21 1538    Ethan Lemming, MD 03/15/21 1446

## 2021-03-14 NOTE — ED Triage Notes (Addendum)
Patient's wife reports that the patient was suppose to have a thoracentesis today, but missed the appointment. Patient's wife reports that when she got home with the patient today, he would only stare at her and when she asked him to say her name, he would shake his head indicating yes and say yeah over and over.  Patient's wife rep[orts that he started a new drug-Amivantamab on Wednesday and Thursday.

## 2021-03-14 NOTE — ED Notes (Signed)
Patient is resting quietly when checked.  Continuous cardiac monitor in place.  Patient is able to stand at the bedside with assist to urinate

## 2021-03-17 ENCOUNTER — Ambulatory Visit (HOSPITAL_COMMUNITY)
Admission: RE | Admit: 2021-03-17 | Discharge: 2021-03-17 | Disposition: A | Discharge status: Home / Self Care | Payer: Medicare Other | Source: Ambulatory Visit | Attending: Radiology | Admitting: Radiology

## 2021-03-17 ENCOUNTER — Ambulatory Visit (HOSPITAL_COMMUNITY)
Admission: RE | Admit: 2021-03-17 | Discharge: 2021-03-17 | Disposition: A | Discharge status: Home / Self Care | Payer: Medicare Other | Source: Ambulatory Visit | Attending: Physician Assistant | Admitting: Physician Assistant

## 2021-03-17 ENCOUNTER — Other Ambulatory Visit: Payer: Self-pay

## 2021-03-17 DIAGNOSIS — Z9889 Other specified postprocedural states: Secondary | ICD-10-CM

## 2021-03-17 DIAGNOSIS — J910 Malignant pleural effusion: Secondary | ICD-10-CM | POA: Diagnosis not present

## 2021-03-17 DIAGNOSIS — J90 Pleural effusion, not elsewhere classified: Secondary | ICD-10-CM | POA: Insufficient documentation

## 2021-03-17 DIAGNOSIS — C3491 Malignant neoplasm of unspecified part of right bronchus or lung: Secondary | ICD-10-CM | POA: Insufficient documentation

## 2021-03-17 MED ORDER — LIDOCAINE HCL 1 % IJ SOLN
INTRAMUSCULAR | Status: AC
  Filled 2021-03-17: qty 20

## 2021-03-17 NOTE — Procedures (Signed)
Ultrasound-guided therapeutic right thoracentesis performed yielding 350 cc of dark brown fluid. No immediate complications. Follow-up chest x-ray pending. Due to pt chest discomfort only the above amount of fluid was removed today. EBL < 1 cc.

## 2021-03-18 MED FILL — Dexamethasone Sodium Phosphate Inj 100 MG/10ML: INTRAMUSCULAR | Qty: 1 | Status: AC

## 2021-03-19 ENCOUNTER — Other Ambulatory Visit: Payer: Self-pay | Admitting: Physician Assistant

## 2021-03-19 ENCOUNTER — Other Ambulatory Visit: Payer: Self-pay

## 2021-03-19 ENCOUNTER — Encounter: Payer: Self-pay | Admitting: Internal Medicine

## 2021-03-19 ENCOUNTER — Inpatient Hospital Stay (HOSPITAL_BASED_OUTPATIENT_CLINIC_OR_DEPARTMENT_OTHER): Payer: Medicare Other | Admitting: Internal Medicine

## 2021-03-19 ENCOUNTER — Inpatient Hospital Stay: Payer: Medicare Other

## 2021-03-19 VITALS — BP 126/87 | HR 115 | Temp 99.0°F | Resp 19 | Ht 71.0 in | Wt 172.0 lb

## 2021-03-19 DIAGNOSIS — C7951 Secondary malignant neoplasm of bone: Secondary | ICD-10-CM

## 2021-03-19 DIAGNOSIS — C342 Malignant neoplasm of middle lobe, bronchus or lung: Secondary | ICD-10-CM | POA: Diagnosis not present

## 2021-03-19 DIAGNOSIS — Z5111 Encounter for antineoplastic chemotherapy: Secondary | ICD-10-CM | POA: Diagnosis not present

## 2021-03-19 DIAGNOSIS — C7931 Secondary malignant neoplasm of brain: Secondary | ICD-10-CM

## 2021-03-19 DIAGNOSIS — C3491 Malignant neoplasm of unspecified part of right bronchus or lung: Secondary | ICD-10-CM

## 2021-03-19 DIAGNOSIS — J90 Pleural effusion, not elsewhere classified: Secondary | ICD-10-CM

## 2021-03-19 DIAGNOSIS — C3490 Malignant neoplasm of unspecified part of unspecified bronchus or lung: Secondary | ICD-10-CM

## 2021-03-19 DIAGNOSIS — Z79899 Other long term (current) drug therapy: Secondary | ICD-10-CM | POA: Diagnosis not present

## 2021-03-19 DIAGNOSIS — R059 Cough, unspecified: Secondary | ICD-10-CM

## 2021-03-19 DIAGNOSIS — Z923 Personal history of irradiation: Secondary | ICD-10-CM | POA: Diagnosis not present

## 2021-03-19 LAB — CBC WITH DIFFERENTIAL (CANCER CENTER ONLY)
Abs Immature Granulocytes: 0.02 10*3/uL (ref 0.00–0.07)
Basophils Absolute: 0 10*3/uL (ref 0.0–0.1)
Basophils Relative: 0 %
Eosinophils Absolute: 0 10*3/uL (ref 0.0–0.5)
Eosinophils Relative: 0 %
HCT: 40.4 % (ref 39.0–52.0)
Hemoglobin: 13.9 g/dL (ref 13.0–17.0)
Immature Granulocytes: 0 %
Lymphocytes Relative: 11 %
Lymphs Abs: 0.7 10*3/uL (ref 0.7–4.0)
MCH: 31.6 pg (ref 26.0–34.0)
MCHC: 34.4 g/dL (ref 30.0–36.0)
MCV: 91.8 fL (ref 80.0–100.0)
Monocytes Absolute: 0.5 10*3/uL (ref 0.1–1.0)
Monocytes Relative: 7 %
Neutro Abs: 5.2 10*3/uL (ref 1.7–7.7)
Neutrophils Relative %: 82 %
Platelet Count: 141 10*3/uL — ABNORMAL LOW (ref 150–400)
RBC: 4.4 MIL/uL (ref 4.22–5.81)
RDW: 13 % (ref 11.5–15.5)
WBC Count: 6.4 10*3/uL (ref 4.0–10.5)
nRBC: 0 % (ref 0.0–0.2)

## 2021-03-19 LAB — CMP (CANCER CENTER ONLY)
ALT: 29 U/L (ref 0–44)
AST: 18 U/L (ref 15–41)
Albumin: 2.4 g/dL — ABNORMAL LOW (ref 3.5–5.0)
Alkaline Phosphatase: 156 U/L — ABNORMAL HIGH (ref 38–126)
Anion gap: 9 (ref 5–15)
BUN: 19 mg/dL (ref 8–23)
CO2: 24 mmol/L (ref 22–32)
Calcium: 8.3 mg/dL — ABNORMAL LOW (ref 8.9–10.3)
Chloride: 104 mmol/L (ref 98–111)
Creatinine: 0.94 mg/dL (ref 0.61–1.24)
GFR, Estimated: >60 mL/min (ref >60)
Glucose, Bld: 119 mg/dL — ABNORMAL HIGH (ref 70–99)
Potassium: 3.8 mmol/L (ref 3.5–5.1)
Sodium: 137 mmol/L (ref 135–145)
Total Bilirubin: 0.4 mg/dL (ref 0.3–1.2)
Total Protein: 5.8 g/dL — ABNORMAL LOW (ref 6.5–8.1)

## 2021-03-19 MED ORDER — MIRTAZAPINE 15 MG PO TABS: 15.0000 mg | ORAL_TABLET | Freq: Every day | ORAL | 2 refills | Status: DC

## 2021-03-19 NOTE — Progress Notes (Signed)
Ethan Brown [phone]   [phone]  OFFICE PROGRESS NOTE  Laurey Morale, MD [place] [address]  DIAGNOSIS: Stage IV (T2b, N2, M1c) presented with right middle lobe lung mass in addition to mediastinal lymphadenopathy as well as malignant right pleural effusion with pleural-based nodules and suspicious right hepatic lesion and the brain metastasis diagnosed in July 2020.  Molecular Biomarkers: [id number] (Exon 20 insertion) 0.2% Dacomitinib,Neratinib,Osimertinib  [id number] 0.1% None   PRIOR THERAPY:  1) Systemic chemotherapy with carboplatin for AUC of 5, Alimta 500 mg/M2 and Keytruda 200 mg IV every 3 weeks.  First dose April 04, 2019.  Status post 21 cycles  Starting from cycle #5 the patient is on maintenance treatment with Alimta and Keytruda every 3 weeks.  Starting from cycle #19 he will be on single agent Alimta.  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 2) whole brain irradiation for multiple metastatic brain lesions. 3)  Mobocertinib (Axkibity) 937 mg p.o. daily.  First dose was June 29, 2020.  Status post 6 months of treatment.  This was discontinued secondary to disease progression in the brain as well as the chest. 4) treatment was targeted therapy with Tagrisso 80 mg p.o. daily status post 7 weeks of treatment discontinued secondary to disease progression.  CURRENT THERAPY: Amivantamab 350 Mg IV on day 1 followed by 1050 Mg IV on day 2 of cycle #1 started on March 12, 2021 status post 1 cycle.  Starting from cycle #2 his dose will be 1400 Mg IV weekly for 3 cycles followed by 1400 Mg IV every 2 weeks starting from cycle #4.  INTERVAL HISTORY: Ethan Brown 70 y.o. male returns to the clinic today for follow-up visit accompanied by his son and daughter.  The patient continues to complain of increasing fatigue and weakness.  He had 1 or 2 days of  confusion last week.  He was seen at the emergency department and no concerning findings at that time.  He also underwent ultrasound-guided right thoracentesis with drainage of 350 mL of pleural fluid and the procedure was aborted because of pain.  He tolerated his first cycle of the treatment fairly well except for the initial hypersensitivity reaction and adjustment of his premedication on day one of the treatment with Amivantamab.  He tolerated day 2 very well with no concerning issues.  He continues to have the persistent dry cough and he is on multiple cough medication.  He also has shortness of breath with exertion but no significant chest pain or hemoptysis.  He has no nausea, vomiting, diarrhea or constipation.  He has no headache or visual changes.  He is here today for evaluation before starting cycle #2 of his treatment tomorrow.  MEDICAL HISTORY: Past Medical History:  Diagnosis Date   Borderline hypertension    Brain metastasis ([place]) dx'd 01/2019   Detached vitreous humor, left    Dyspnea    ED (erectile dysfunction) of organic origin    Hyperlipidemia    Hypertension    Lens subluxation, left    Lung cancer ([place]) dx'd 01/2019   Stage IV   Migraine headache    takes PRN Imitrex   Pseudophakia    TGA (transient global amnesia) 2017    ALLERGIES:  has No Known Allergies.  MEDICATIONS:  Current Outpatient Medications  Medication Sig Dispense Refill   cyanocobalamin (,VITAMIN B-12,) 1000 MCG/ML injection INJECT 1 ML (1,000 MCG TOTAL) INTO THE  MUSCLE ONCE FOR 1 DOSE. (Patient taking differently: Inject 1,000 mcg into the skin every 30 (thirty) days.) 5 mL 1   cycloSPORINE (RESTASIS) 0.05 % ophthalmic emulsion Place 1 drop into both eyes 2 (two) times daily.     doxycycline (VIBRAMYCIN) 100 MG capsule Take 1 capsule (100 mg total) by mouth 2 (two) times daily. 14 capsule 0   hydrochlorothiazide (MICROZIDE) 12.5 MG capsule TAKE 1 CAPSULE BY MOUTH EVERY DAY (Patient taking  differently: Take 12.5 mg by mouth every other day.) 90 capsule 3   HYDROcodone bit-homatropine (HYCODAN) 5-1.5 MG/5ML syrup Take 5 mLs by mouth every 6 (six) hours as needed for cough. 473 mL 0   hydrocortisone 1 % lotion Apply 1 application topically 2 (two) times daily. (Patient not taking: No sig reported) 113 g 0   lidocaine (XYLOCAINE) 2 % solution Use as directed 5 mLs in the mouth or throat every 3 (three) hours as needed for mouth pain. Gargle and spit (Patient not taking: No sig reported) 200 mL 2   lidocaine-prilocaine (EMLA) cream Apply to the Port-A-Cath site 30-60 minutes before treatment (Patient taking differently: Apply 1 application topically daily as needed. Apply to the Port-A-Cath site 30-60 minutes before treatment) 30 g 0   LORazepam (ATIVAN) 1 MG tablet Take 1 tablet (1 mg total) by mouth as needed for anxiety (30 minutes prior to radiation treatment and may repeat just prior to procedure if needed.). (Patient taking differently: Take 1 mg by mouth daily.) 10 tablet 0   magic mouthwash SOLN Take 5 mLs by mouth 4 (four) times daily as needed for mouth pain. Swish and spit or swallow (Patient not taking: No sig reported) 240 mL 0   memantine (NAMENDA) 10 MG tablet TAKE 1 TABLET BY MOUTH TWICE A DAY (Patient taking differently: Take 10 mg by mouth 2 (two) times daily.) 180 tablet 2   mirtazapine (REMERON) 15 MG tablet Take 1 tablet (15 mg total) by mouth at bedtime. 30 tablet 2   osimertinib mesylate (TAGRISSO) 80 MG tablet Take 1 tablet (80 mg total) by mouth daily. (Patient not taking: No sig reported) 30 tablet 2   prochlorperazine (COMPAZINE) 10 MG tablet Take 1 tablet (10 mg total) by mouth every 6 (six) hours as needed for nausea or vomiting. 30 tablet 1   sildenafil (REVATIO) 20 MG tablet TAKE AS DIRECTED (Patient taking differently: 20 mg. Take as directed) 10 tablet 11   tamsulosin (FLOMAX) 0.4 MG CAPS capsule Take 0.4 mg by mouth daily.     No current  facility-administered medications for this visit.    SURGICAL HISTORY:  Past Surgical History:  Procedure Laterality Date   CAROTID DOPPLERS  09/2017   Mild non-obstructive disease   CATARACT EXTRACTION, BILATERAL     cataract, left  2018   CHEST TUBE INSERTION Right 03/29/2019   Procedure: INSERTION PLEURAL DRAINAGE CATHETER;  Surgeon: [name], MD;  Location: [facility] OR;  Service: Thoracic;  Laterality: Right;   COLONOSCOPY  2016   clear, repeat in 10 yrs    Eye surgeries     , Lens attachment.  Vitrectomy   FEMORAL HERNIA REPAIR     HERNIA REPAIR     IR THORACENTESIS ASP PLEURAL SPACE W/IMG GUIDE  02/22/2019   IR THORACENTESIS ASP PLEURAL SPACE W/IMG GUIDE  03/13/2019   IR THORACENTESIS ASP PLEURAL SPACE W/IMG GUIDE  03/18/2020   LUMBAR LAMINECTOMY     PLEURAL BIOPSY Right 03/29/2019   Procedure: PLEURAL BIOPSY;  Surgeon: [name], MD;  Location: [facility];  Service: Thoracic;  Laterality: Right;   PORTACATH PLACEMENT N/A 03/29/2019   Procedure: INSERTION PORT-A-CATH;  Surgeon: [name], MD;  Location: [facility];  Service: Thoracic;  Laterality: N/A;   retinal attachment, right     sclearl buckle, right     TRANSTHORACIC ECHOCARDIOGRAM  12/14/2019   EF 50 EF 55% (low normal).  Abnormal septal wall motion related to BBB; indeterminate diastolic parameters.  Mild RV enlargement with mildly reduced function.  Normal valves.  Normal atrial sizes.   victrectomy,right     VIDEO ASSISTED THORACOSCOPY Right 03/29/2019   Procedure: VIDEO ASSISTED THORACOSCOPY;  Surgeon: [name], MD;  Location: [facility];  Service: Thoracic;  Laterality: Right;    REVIEW OF SYSTEMS:  Constitutional: positive for fatigue and weight loss Eyes: negative Ears, nose, mouth, throat, and face: negative Respiratory: positive for cough and dyspnea on exertion Cardiovascular: negative Gastrointestinal: negative Genitourinary:negative Integument/breast:  negative Hematologic/lymphatic: negative Musculoskeletal:negative Neurological: positive for memory problems and weakness Behavioral/Psych: negative Endocrine: negative Allergic/Immunologic: negative   PHYSICAL EXAMINATION: General appearance: alert, cooperative, fatigued, and no distress Head: Normocephalic, without obvious abnormality, atraumatic Neck: no adenopathy, no JVD, supple, symmetrical, trachea midline, and thyroid not enlarged, symmetric, no tenderness/mass/nodules Lymph nodes: Cervical, supraclavicular, and axillary nodes normal. Resp: diminished breath sounds RLL and dullness to percussion RLL Back: symmetric, no curvature. ROM normal. No CVA tenderness. Cardio: regular rate and rhythm, S1, S2 normal, no murmur, click, rub or gallop GI: soft, non-tender; bowel sounds normal; no masses,  no organomegaly Extremities: extremities normal, atraumatic, no cyanosis or edema Neurologic: Alert and oriented X 3, normal strength and tone. Normal symmetric reflexes. Normal coordination and gait  ECOG PERFORMANCE STATUS: 1 - Symptomatic but completely ambulatory  Blood pressure 126/87, pulse (!) 115, temperature 99 F (37.2 C), temperature source Tympanic, resp. rate 19, height '5\' 11"'  (1.803 m), weight 172 lb (78 kg), SpO2 99 %.  LABORATORY DATA: Lab Results  Component Value Date   WBC 6.4 03/19/2021   HGB 13.9 03/19/2021   HCT 40.4 03/19/2021   MCV 91.8 03/19/2021   PLT 141 (L) 03/19/2021      Chemistry      Component Value Date/Time   NA 137 03/19/2021 0910   K 3.8 03/19/2021 0910   CL 104 03/19/2021 0910   CO2 24 03/19/2021 0910   BUN 19 03/19/2021 0910   CREATININE 0.94 03/19/2021 0910      Component Value Date/Time   CALCIUM 8.3 (L) 03/19/2021 0910   ALKPHOS 156 (H) 03/19/2021 0910   AST 18 03/19/2021 0910   ALT 29 03/19/2021 0910   BILITOT 0.4 03/19/2021 0910       RADIOGRAPHIC STUDIES: DG Chest 1 View  Result Date: 03/17/2021 CLINICAL DATA:   70 year old male with history of lung malignancy, status post thoracentesis. EXAM: CHEST  1 VIEW COMPARISON:  Chest radiograph, 03/14/2021.  CT chest, 01/02/2021. FINDINGS: Cardiac silhouette is within normal limits. The left lung is well inflated. Consolidation with silhouetting of the right hemidiaphragm, unchanged. Moderate volume right, and trace left pleural effusion. No pneumothorax. Right chest port. No acute osseous abnormality. IMPRESSION: 1. No pneumothorax. 2. Moderate volume right pleural effusion with associated right basilar consolidation, unchanged. Electronically Signed   By: Michaelle Birks MD   On: 03/17/2021 13:22   CT Head Wo Contrast  Result Date: 03/14/2021 CLINICAL DATA:  Delirium; history of metastatic lung cancer. EXAM: CT HEAD WITHOUT CONTRAST TECHNIQUE: Contiguous  axial images were obtained from the base of the skull through the vertex without intravenous contrast. COMPARISON:  December 05, 2020 FINDINGS: Brain: No evidence of acute infarction, hemorrhage or hydrocephalus. Known multifocal intracranial metastatic disease is not well visualized with current modality. Vascular: No hyperdense vessel or unexpected calcification. Skull: No acute fracture. Sinuses/Orbits: No acute finding. Other: None. IMPRESSION: No acute intracranial abnormality. Known intracranial metastatic disease is not well visualized on CT. Recommend further evaluation with dedicated MRI for improved comparison. Electronically Signed   By: Valentino Saxon MD   On: 03/14/2021 16:13   CT Chest W Contrast  Result Date: 02/28/2021 CLINICAL DATA:  Stage IV non-small cell right lung cancer. Restaging. EXAM: CT CHEST, ABDOMEN, AND PELVIS WITH CONTRAST TECHNIQUE: Multidetector CT imaging of the chest, abdomen and pelvis was performed following the standard protocol during bolus administration of intravenous contrast. CONTRAST:  175m OMNIPAQUE IOHEXOL 300 MG/ML  SOLN COMPARISON:  01/02/2021 FINDINGS: CT CHEST FINDINGS  Cardiovascular: The heart size is normal. No substantial pericardial effusion. Right Port-A-Cath tip is positioned in the distal SVC. Mediastinum/Nodes: 10 mm short axis precarinal node measured previously is 9 mm today. Small subcarinal node with amorphous soft tissue in the subcarinal station and right hilum is again noted no left hilar lymphadenopathy. The esophagus has normal imaging features. There is no axillary lymphadenopathy. Lungs/Pleura: Interval increase in volume loss right hemithorax. 1.2 x 1.1 cm parahilar right upper lobe pulmonary nodule is clearly progressive in the interval measuring 2.1 x 2.0 cm today on image 82/5. The 1.5 x 1.2 cm medial right lung nodule measured previously is now 1.8 x 1.3 cm and has become more incorporated into a nodular area of consolidative lung opacity. Nodularity along the minor fissure is progressive. 9 mm nodule on 88/5 was 5 mm previously (remeasured). Progressive collapse/consolidative disease in the right lower lobe with worsening interstitial opacity concerning for lymphangitic tumor spread. There is a new ill-defined nodular consolidative opacity in the paraspinal right lower lobe (85/5). Similar loculated posterior right pleural fluid collection with new left pleural effusion on the current study. Nodular pleural disease in the anterior right base (image 50/series 3) is progressive measuring 4.1 x 1.5 cm today compared to 1.9 x 0 point 9 cm previously. Musculoskeletal: Stable sclerosis posterior right twelfth rib. 14 mm sclerotic lesion in the L1 vertebral body similar to prior. Sclerosis in the lateral left sixth rib appears to be new in the interval. CT ABDOMEN PELVIS FINDINGS Hepatobiliary: No suspicious focal abnormality within the liver parenchyma. There is no evidence for gallstones, gallbladder wall thickening, or pericholecystic fluid. No intrahepatic or extrahepatic biliary dilation. Pancreas: No focal mass lesion. No dilatation of the main duct. No  intraparenchymal cyst. No peripancreatic edema. Spleen: No splenomegaly. No focal mass lesion. Adrenals/Urinary Tract: No adrenal nodule or mass. Kidneys unremarkable. No evidence for hydroureter. Tiny stone debris seen in the posterior right bladder previously is now visible in the posterior left bladder on image 119/3. Stomach/Bowel: Stomach is unremarkable. No gastric wall thickening. No evidence of outlet obstruction. Duodenum is normally positioned as is the ligament of Treitz. No small bowel wall thickening. No small bowel dilatation. The terminal ileum is normal. The appendix is not well visualized, but there is no edema or inflammation in the region of the cecum. No gross colonic mass. No colonic wall thickening. Diverticuli are seen scattered along the entire length of the colon without CT findings of diverticulitis. Vascular/Lymphatic: No abdominal aortic aneurysm. No abdominal aortic atherosclerotic calcification. There  is no gastrohepatic or hepatoduodenal ligament lymphadenopathy. No retroperitoneal or mesenteric lymphadenopathy. No pelvic sidewall lymphadenopathy. Reproductive: Prostate gland is enlarged. Other: No intraperitoneal free fluid. Musculoskeletal: Soft tissue density in the left groin region is compatible with prior herniorrhaphy. Interval progression of sclerotic changes in the anterior right acetabulum and superior pubic ramus. 1 cm sclerotic lesion seen previously in the right femoral neck has increased to 2 cm today. Posterior right iliac lesion shows more prominent overlying cortical loss. IMPRESSION: 1. Interval progression of right-sided pulmonary nodules with interval progression of interstitial opacity in the right lower lobe and new ill-defined nodular consolidative opacity in the paraspinal right lower lobe. Imaging features are compatible with progression of disease. Superimposed pneumonia cannot be excluded. 2. Interval progression of irregular nodular pleural disease anterior  right costophrenic sulcus. 3. Similar appearance of loculated posterior right pleural fluid collection with new left pleural effusion. 4. Sclerotic bone metastases are stable in some regions and clearly progressive in others. 5. Tiny stone debris again noted in the urinary bladder. 6. Prostatomegaly. 7. Aortic Atherosclerosis (ICD10-I70.0). Electronically Signed   By: Misty Stanley M.D.   On: 02/28/2021 09:39   CT Abdomen Pelvis W Contrast  Result Date: 02/28/2021 CLINICAL DATA:  Stage IV non-small cell right lung cancer. Restaging. EXAM: CT CHEST, ABDOMEN, AND PELVIS WITH CONTRAST TECHNIQUE: Multidetector CT imaging of the chest, abdomen and pelvis was performed following the standard protocol during bolus administration of intravenous contrast. CONTRAST:  142m OMNIPAQUE IOHEXOL 300 MG/ML  SOLN COMPARISON:  01/02/2021 FINDINGS: CT CHEST FINDINGS Cardiovascular: The heart size is normal. No substantial pericardial effusion. Right Port-A-Cath tip is positioned in the distal SVC. Mediastinum/Nodes: 10 mm short axis precarinal node measured previously is 9 mm today. Small subcarinal node with amorphous soft tissue in the subcarinal station and right hilum is again noted no left hilar lymphadenopathy. The esophagus has normal imaging features. There is no axillary lymphadenopathy. Lungs/Pleura: Interval increase in volume loss right hemithorax. 1.2 x 1.1 cm parahilar right upper lobe pulmonary nodule is clearly progressive in the interval measuring 2.1 x 2.0 cm today on image 82/5. The 1.5 x 1.2 cm medial right lung nodule measured previously is now 1.8 x 1.3 cm and has become more incorporated into a nodular area of consolidative lung opacity. Nodularity along the minor fissure is progressive. 9 mm nodule on 88/5 was 5 mm previously (remeasured). Progressive collapse/consolidative disease in the right lower lobe with worsening interstitial opacity concerning for lymphangitic tumor spread. There is a new ill-defined  nodular consolidative opacity in the paraspinal right lower lobe (85/5). Similar loculated posterior right pleural fluid collection with new left pleural effusion on the current study. Nodular pleural disease in the anterior right base (image 50/series 3) is progressive measuring 4.1 x 1.5 cm today compared to 1.9 x 0 point 9 cm previously. Musculoskeletal: Stable sclerosis posterior right twelfth rib. 14 mm sclerotic lesion in the L1 vertebral body similar to prior. Sclerosis in the lateral left sixth rib appears to be new in the interval. CT ABDOMEN PELVIS FINDINGS Hepatobiliary: No suspicious focal abnormality within the liver parenchyma. There is no evidence for gallstones, gallbladder wall thickening, or pericholecystic fluid. No intrahepatic or extrahepatic biliary dilation. Pancreas: No focal mass lesion. No dilatation of the main duct. No intraparenchymal cyst. No peripancreatic edema. Spleen: No splenomegaly. No focal mass lesion. Adrenals/Urinary Tract: No adrenal nodule or mass. Kidneys unremarkable. No evidence for hydroureter. Tiny stone debris seen in the posterior right bladder previously is now visible  in the posterior left bladder on image 119/3. Stomach/Bowel: Stomach is unremarkable. No gastric wall thickening. No evidence of outlet obstruction. Duodenum is normally positioned as is the ligament of Treitz. No small bowel wall thickening. No small bowel dilatation. The terminal ileum is normal. The appendix is not well visualized, but there is no edema or inflammation in the region of the cecum. No gross colonic mass. No colonic wall thickening. Diverticuli are seen scattered along the entire length of the colon without CT findings of diverticulitis. Vascular/Lymphatic: No abdominal aortic aneurysm. No abdominal aortic atherosclerotic calcification. There is no gastrohepatic or hepatoduodenal ligament lymphadenopathy. No retroperitoneal or mesenteric lymphadenopathy. No pelvic sidewall  lymphadenopathy. Reproductive: Prostate gland is enlarged. Other: No intraperitoneal free fluid. Musculoskeletal: Soft tissue density in the left groin region is compatible with prior herniorrhaphy. Interval progression of sclerotic changes in the anterior right acetabulum and superior pubic ramus. 1 cm sclerotic lesion seen previously in the right femoral neck has increased to 2 cm today. Posterior right iliac lesion shows more prominent overlying cortical loss. IMPRESSION: 1. Interval progression of right-sided pulmonary nodules with interval progression of interstitial opacity in the right lower lobe and new ill-defined nodular consolidative opacity in the paraspinal right lower lobe. Imaging features are compatible with progression of disease. Superimposed pneumonia cannot be excluded. 2. Interval progression of irregular nodular pleural disease anterior right costophrenic sulcus. 3. Similar appearance of loculated posterior right pleural fluid collection with new left pleural effusion. 4. Sclerotic bone metastases are stable in some regions and clearly progressive in others. 5. Tiny stone debris again noted in the urinary bladder. 6. Prostatomegaly. 7. Aortic Atherosclerosis (ICD10-I70.0). Electronically Signed   By: Misty Stanley M.D.   On: 02/28/2021 09:39   DG Chest Port 1 View  Result Date: 03/14/2021 CLINICAL DATA:  Altered mental status. suppose to have a thoracentesis today, but missed the appointment. Patient's wife reports that when she got home with the patient today, he would only stare at her. EXAM: PORTABLE CHEST 1 VIEW.  Patient rotated. COMPARISON:  CT 01/02/2021.  Chest x-ray 04/09/2020 FINDINGS: Accessed right chest wall Port-A-Cath with tip overlying the expected region of the distal superior vena cava. Prominent cardiac silhouette likely due to AP technique and rotation. Otherwise heart size and mediastinal contours are within normal limits. Fullness of the right hilum. Right lower mid  lung zone hazy airspace opacity. No pulmonary edema. Persistent small to moderate volume right pleural effusion. No definite left pleural effusion. No pneumothorax. No acute osseous abnormality. IMPRESSION: 1. Persistent small to moderate volume right pleural effusion. Associated right lower mid lung zone hazy airspace opacity. 2. Prominent cardiac silhouette likely due to AP technique and rotation. 3. Fullness of the right hilum. May be due to rotation versus underlying adenopathy/lesion. Electronically Signed   By: Iven Finn M.D.   On: 03/14/2021 16:47   US Thoracentesis Asp Pleural space w/IMG guide  Result Date: 03/17/2021 INDICATION: Patient with history of stage IV right lung cancer with recurrent malignant right pleural effusion; request received for therapeutic right thoracentesis. EXAM: ULTRASOUND GUIDED THERAPEUTIC RIGHT THORACENTESIS MEDICATIONS: 1% lidocaine to skin and subcutaneous tissue COMPLICATIONS: None immediate. PROCEDURE: An ultrasound guided thoracentesis was thoroughly discussed with the patient and questions answered. The benefits, risks, alternatives and complications were also discussed. The patient understands and wishes to proceed with the procedure. Written consent was obtained. Ultrasound was performed to localize and mark an adequate pocket of fluid in the right chest. The area was then prepped and  draped in the normal sterile fashion. 1% Lidocaine was used for local anesthesia. Under ultrasound guidance a 6 Fr Safe-T-Centesis catheter was introduced. Thoracentesis was performed. The catheter was removed and a dressing applied. FINDINGS: A total of approximately 350 cc of dark brown fluid was removed. Due to patient chest discomfort only the above amount of fluid was removed today. IMPRESSION: Successful ultrasound guided therapeutic right thoracentesis yielding 350 cc of pleural fluid. Read by: Rowe Robert, PA-C Electronically Signed   By: Michaelle Birks MD   On: 03/17/2021  13:04     ASSESSMENT AND PLAN: This is a very pleasant 70 years old white male recently diagnosed with a stage IV (T2b, N2, M1C) non-small cell lung cancer, adenocarcinoma with positive EGFR mutation in exon 20 (resistant mutation) diagnosed in July 2020 and presented with extensive disease involving the right upper lobe as well as the right middle and lower lobe with extensive pleural based metastasis as well as mediastinal and hilar disease with malignant pleural effusion as well as liver and brain metastasis. Unfortunately the patient has no actionable mutations based on the molecular studies by [employer].   The patient is currently undergoing systemic chemotherapy with carboplatin for AUC of 5, Alimta 500 mg/M2 and Keytruda 200 mg IV every 3 weeks status post 21 cycles.  Starting from cycle #5 the patient is on maintenance treatment with Alimta and Keytruda. Beryle Flock was discontinued after cycle #18 secondary to recurrent immunotherapy mediated pneumonitis. Starting from cycle #19 the patient will be on treatment with single agent Alimta every 3 weeks. The patient has been tolerating the treatment well. He had repeat PET scan and MRI of the brain performed recently.  The PET scan showed evidence for disease progression with metastatic disease involving the thoracic and abdominal lymph nodes as well as the right hemithorax and bones.  He also has loculated moderate right pleural effusion and small left pleural effusion.  The PET scan also showed prostate enlargement and his PSA has been rising recently.  He was seen by urology. MRI of the brain showed development of new 5 mm right temporal lesion suspicious for metastatic disease as well as new 2 mm focus of enhancement in the left periventricular white matter also suspicious for metastatic disease.  He was seen by radiation oncology and expected to have [place] to this lesion soon. The patient started treatment with Mobocertinib (Axkibity) 964 mg p.o.  daily.  Status post 6 months of treatment.  He has been tolerating the treatment well with no concerning adverse effects except for the dryness of his nose that is not responding to the over-the-counter medication.  His treatment was discontinued secondary to disease progression. Unfortunately he had significant disease progression in the brain with innumerable brain metastasis in addition to previous increase in some of the pulmonary nodules seen on the previous imaging studies.  He completed a course of whole brain irradiation. The patient was treated with Tagrisso 80 mg p.o. daily status post 7 weeks of treatment discontinued secondary to disease progression. The patient started last week the first dose of his treatment with Amivantamab with reduced dose of 350 Mg IV on day 1 and 1050 Mg IV on day 2.  He has hypersensitivity reaction on day 1 required a lot of premedication and treatment with Solu-Medrol, Pepcid and Benadryl as well as Demerol.  He tolerated day 2 much better. I had a lengthy discussion with the patient and his son and daughter today about his current condition and  gives him a summary of his progression from the time of diagnosis until now. I recommended for him to proceed with cycle #2 tomorrow as planned with a dose of 1400 Mg IV and he will continue with the same regimen for 3 more cycles on weekly basis before switching to every other week starting from cycle #4. The patient would like to continue his treatment as planned.  We expect to repeat imaging studies after 6 weeks of his treatment. For the lack of appetite and dehydration, I strongly encouraged the patient to increase his oral intake and will consider him for IV fluid if needed. For the recurrent pleural effusion, he underwent ultrasound-guided right thoracentesis but was able to have drainage of 350 mL of the fluid only and the procedure was aborted because of pain. For the cough he will continue his current cough  medication. The patient was advised to call immediately if he has any other concerning symptoms in the interval. The patient voices understanding of current disease status and treatment options and is in agreement with the current care plan. All questions were answered. The patient knows to call the clinic with any problems, questions or concerns. We can certainly see the patient much sooner if necessary.  Disclaimer: This note was dictated with voice recognition software. Similar sounding words can inadvertently be transcribed and may not be corrected upon review.

## 2021-03-20 ENCOUNTER — Inpatient Hospital Stay: Payer: Medicare Other | Admitting: Nutrition

## 2021-03-20 ENCOUNTER — Inpatient Hospital Stay: Payer: Medicare Other

## 2021-03-20 VITALS — BP 130/67 | HR 106 | Temp 98.4°F | Resp 18 | Wt 169.4 lb

## 2021-03-20 DIAGNOSIS — C7931 Secondary malignant neoplasm of brain: Secondary | ICD-10-CM | POA: Diagnosis not present

## 2021-03-20 DIAGNOSIS — C3491 Malignant neoplasm of unspecified part of right bronchus or lung: Secondary | ICD-10-CM

## 2021-03-20 DIAGNOSIS — Z79899 Other long term (current) drug therapy: Secondary | ICD-10-CM | POA: Diagnosis not present

## 2021-03-20 DIAGNOSIS — Z923 Personal history of irradiation: Secondary | ICD-10-CM | POA: Diagnosis not present

## 2021-03-20 DIAGNOSIS — Z5111 Encounter for antineoplastic chemotherapy: Secondary | ICD-10-CM | POA: Diagnosis not present

## 2021-03-20 DIAGNOSIS — C342 Malignant neoplasm of middle lobe, bronchus or lung: Secondary | ICD-10-CM | POA: Diagnosis not present

## 2021-03-20 MED ORDER — SODIUM CHLORIDE 0.9 % IV SOLN
Freq: Once | INTRAVENOUS | Status: AC
  Filled 2021-03-20: qty 250

## 2021-03-20 MED ORDER — ACETAMINOPHEN 325 MG PO TABS
ORAL_TABLET | ORAL | Status: AC
  Filled 2021-03-20: qty 2

## 2021-03-20 MED ORDER — HEPARIN SOD (PORK) LOCK FLUSH 100 UNIT/ML IV SOLN
500.0000 [IU] | Freq: Once | INTRAVENOUS | Status: DC | PRN
  Filled 2021-03-20: qty 5

## 2021-03-20 MED ORDER — SODIUM CHLORIDE 0.9% FLUSH
10.0000 mL | INTRAVENOUS | Status: DC | PRN
  Filled 2021-03-20: qty 10

## 2021-03-20 MED ORDER — SODIUM CHLORIDE 0.9 % IV SOLN
1400.0000 mg | Freq: Once | INTRAVENOUS | Status: AC
  Administered 2021-03-20: 1400 mg via INTRAVENOUS
  Filled 2021-03-20: qty 28

## 2021-03-20 MED ORDER — DIPHENHYDRAMINE HCL 25 MG PO CAPS
ORAL_CAPSULE | ORAL | Status: AC
  Filled 2021-03-20: qty 2

## 2021-03-20 MED ORDER — ACETAMINOPHEN 325 MG PO TABS
650.0000 mg | ORAL_TABLET | Freq: Once | ORAL | Status: AC
Start: 2021-03-20 — End: 2021-03-20
  Administered 2021-03-20: 650 mg via ORAL

## 2021-03-20 MED ORDER — FAMOTIDINE 20 MG IN NS 100 ML IVPB
INTRAVENOUS | Status: AC
  Filled 2021-03-20: qty 100

## 2021-03-20 MED ORDER — FAMOTIDINE 20 MG IN NS 100 ML IVPB
20.0000 mg | Freq: Once | INTRAVENOUS | Status: AC
  Administered 2021-03-20: 20 mg via INTRAVENOUS

## 2021-03-20 MED ORDER — ONDANSETRON HCL 4 MG/2ML IJ SOLN
INTRAMUSCULAR | Status: AC
  Filled 2021-03-20: qty 2

## 2021-03-20 MED ORDER — ONDANSETRON HCL 4 MG/2ML IJ SOLN
8.0000 mg | Freq: Once | INTRAMUSCULAR | Status: AC
  Administered 2021-03-20: 8 mg via INTRAVENOUS

## 2021-03-20 MED ORDER — SODIUM CHLORIDE 0.9 % IV SOLN
10.0000 mg | Freq: Once | INTRAVENOUS | Status: AC
  Administered 2021-03-20: 10 mg via INTRAVENOUS
  Filled 2021-03-20: qty 10

## 2021-03-20 MED ORDER — DIPHENHYDRAMINE HCL 25 MG PO CAPS
50.0000 mg | ORAL_CAPSULE | Freq: Once | ORAL | Status: AC
  Administered 2021-03-20: 50 mg via ORAL

## 2021-03-20 MED ORDER — ONDANSETRON HCL 4 MG/2ML IJ SOLN
INTRAMUSCULAR | Status: AC
  Filled 2021-03-20: qty 4

## 2021-03-20 NOTE — Progress Notes (Signed)
Per Dr. Julien Nordmann, "OK To Treat w/elevated HR".

## 2021-03-20 NOTE — Progress Notes (Signed)
[place] performed this visit but chemotherapy was administered peripherally.

## 2021-03-20 NOTE — Patient Instructions (Signed)
[facility]  Discharge Instructions: Thank you for choosing [facility] to provide your oncology and hematology care.   If you have a lab appointment with the [facility], please go directly to the [facility] and check in at the registration area.   Wear comfortable clothing and clothing appropriate for easy access to any Portacath or PICC line.   We strive to give you quality time with your provider. You may need to reschedule your appointment if you arrive late (15 or more minutes).  Arriving late affects you and other patients whose appointments are after yours.  Also, if you miss three or more appointments without notifying the office, you may be dismissed from the clinic at the provider's discretion.      For prescription refill requests, have your pharmacy contact our office and allow 72 hours for refills to be completed.    Today you received the following chemotherapy and/or immunotherapy agents Amivantamab-[id number]      To help prevent nausea and vomiting after your treatment, we encourage you to take your nausea medication as directed.  BELOW ARE SYMPTOMS THAT SHOULD BE REPORTED IMMEDIATELY: *FEVER GREATER THAN 100.4 F (38 C) OR HIGHER *CHILLS OR SWEATING *NAUSEA AND VOMITING THAT IS NOT CONTROLLED WITH YOUR NAUSEA MEDICATION *UNUSUAL SHORTNESS OF BREATH *UNUSUAL BRUISING OR BLEEDING *URINARY PROBLEMS (pain or burning when urinating, or frequent urination) *BOWEL PROBLEMS (unusual diarrhea, constipation, pain near the anus) TENDERNESS IN MOUTH AND THROAT WITH OR WITHOUT PRESENCE OF ULCERS (sore throat, sores in mouth, or a toothache) UNUSUAL RASH, SWELLING OR PAIN  UNUSUAL VAGINAL DISCHARGE OR ITCHING   Items with * indicate a potential emergency and should be followed up as soon as possible or go to the Emergency Department if any problems should occur.  Please show the CHEMOTHERAPY ALERT CARD or IMMUNOTHERAPY ALERT CARD at  check-in to the Emergency Department and triage nurse.  Should you have questions after your visit or need to cancel or reschedule your appointment, please contact [facility]  Dept: [phone]  and follow the prompts.  Office hours are 8:00 a.m. to 4:30 p.m. Monday - Friday. Please note that voicemails left after 4:00 p.m. may not be returned until the following business day.  We are closed weekends and major holidays. You have access to a nurse at all times for urgent questions. Please call the main number to the clinic Dept: [phone] and follow the prompts.   For any non-urgent questions, you may also contact your provider using MyChart. We now offer e-Visits for anyone 50 and older to request care online for non-urgent symptoms. For details visit [email or web address].   Also download the MyChart app! Go to the app store, search "MyChart", open the app, select [facility], and log in with your MyChart username and password.  Due to Covid, a mask is required upon entering the hospital/clinic. If you do not have a mask, one will be given to you upon arrival. For doctor visits, patients may have 1 support person aged 25 or older with them. For treatment visits, patients cannot have anyone with them due to current Covid guidelines and our immunocompromised population.

## 2021-03-20 NOTE — Progress Notes (Signed)
Nutrition follow-up completed with patient secondary to ongoing weight loss.  Patient receiving treatment for stage IV lung cancer with brain metastases.  He is followed by Dr. Julien Nordmann.  Weight documented as 169.4 poundsJuly 28, decreased from 204.4 pounds July 15, 2020.  This is a 17% weight loss over 8 months which is significant.  Labs noted: Glucose 119 and albumin 2.4.  Medications include Ativan, Magic mouthwash, Remeron, Prilosec, Compazine.  Patient endorses decreased appetite.  His weight has been trending down consistently.  He has been drinking Premier protein which he enjoys.  He denies other nutrition impact symptoms.  Nutrition diagnosis: Unintended weight loss related to cancer and associated treatments as evidenced by 17% weight loss over 8 months.  Intervention: Educated patient on strategies for increasing calories and protein in small frequent meals and snacks.  Provided nutrition facts sheets. Recommended patient try Carnation breakfast essentials powder mixed with fair life milk and increase 3 times daily.  This provides slightly more calories to assist with weight maintenance. Questions answered.  Teach back method used.  Contact information provided.  Monitoring, evaluation, goals: Patient will tolerate increased calories and protein to promote weight stability.  Next visit: Wednesday, August 17 during infusion with Vinnie Level.  **Disclaimer: This note was dictated with voice recognition software. Similar sounding words can inadvertently be transcribed and this note may contain transcription errors which may not have been corrected upon publication of note.**

## 2021-03-20 NOTE — Progress Notes (Signed)
Contacted Dr. Julien Nordmann regarding dosage of medication d/t pt's weight lower than 80kg per tx parameters.  Dr. Julien Nordmann contacted pharmacist Kennith Center and Henreitta Leber via Secure Chat.  [place] responded, "Per [employer], Dose selection is based on baseline body weight; dose adjustments are not required for subsequent changes in body weight."  Pt's dose remained the same 1400mg  this visit.  The rate is 55ml/her and the bag volume is 243ml.  It's supposed to run over 3.85 hrs.

## 2021-03-24 ENCOUNTER — Encounter: Payer: Self-pay | Admitting: Internal Medicine

## 2021-03-24 ENCOUNTER — Other Ambulatory Visit: Payer: Self-pay | Admitting: Physician Assistant

## 2021-03-24 DIAGNOSIS — M79605 Pain in left leg: Secondary | ICD-10-CM

## 2021-03-24 NOTE — Telephone Encounter (Signed)
Pt has been scheduled for his LLL [place] on Friday 03/28/21 with a 9:45a arrival time. I have spoken with the pt and advised as indicated. Pt expressed understanding of this information.

## 2021-03-24 NOTE — Telephone Encounter (Signed)
I spoke with pt to further access. Pt states it is his left leg that is bothering him. He denies redness, swelling, tenderness and radiating pain. Pt explains his left calf only hurts when he is walking around.  This has been discussed with Cassandra PA-C and Dr. Julien Nordmann. An order for an [place] of his LLL has been ordered. Pt is aware of this and expressed understanding of the plan.

## 2021-03-26 ENCOUNTER — Telehealth: Payer: Self-pay | Admitting: Family Medicine

## 2021-03-26 NOTE — Telephone Encounter (Signed)
Left message for patient to call back and schedule Medicare Annual Wellness Visit (AWV) either virtually or in office.   Last AWV ;awvs 03/25/19 per [facility]  please schedule at anytime with [facility] Nurse Health Advisor 1 or 2  Patient also needs appointment with pcp last appointment 01/23/2019  This should be a 45 minute visit.

## 2021-03-27 ENCOUNTER — Inpatient Hospital Stay: Payer: Medicare Other | Attending: Internal Medicine

## 2021-03-27 ENCOUNTER — Inpatient Hospital Stay: Payer: Medicare Other

## 2021-03-27 ENCOUNTER — Other Ambulatory Visit: Payer: Self-pay

## 2021-03-27 VITALS — BP 111/69 | HR 99 | Temp 98.2°F | Wt 174.2 lb

## 2021-03-27 DIAGNOSIS — Z79899 Other long term (current) drug therapy: Secondary | ICD-10-CM | POA: Insufficient documentation

## 2021-03-27 DIAGNOSIS — C787 Secondary malignant neoplasm of liver and intrahepatic bile duct: Secondary | ICD-10-CM | POA: Insufficient documentation

## 2021-03-27 DIAGNOSIS — Z923 Personal history of irradiation: Secondary | ICD-10-CM | POA: Insufficient documentation

## 2021-03-27 DIAGNOSIS — Z86718 Personal history of other venous thrombosis and embolism: Secondary | ICD-10-CM | POA: Insufficient documentation

## 2021-03-27 DIAGNOSIS — C7951 Secondary malignant neoplasm of bone: Secondary | ICD-10-CM | POA: Insufficient documentation

## 2021-03-27 DIAGNOSIS — Z5111 Encounter for antineoplastic chemotherapy: Secondary | ICD-10-CM | POA: Diagnosis not present

## 2021-03-27 DIAGNOSIS — C7931 Secondary malignant neoplasm of brain: Secondary | ICD-10-CM | POA: Diagnosis not present

## 2021-03-27 DIAGNOSIS — Z7901 Long term (current) use of anticoagulants: Secondary | ICD-10-CM | POA: Insufficient documentation

## 2021-03-27 DIAGNOSIS — C342 Malignant neoplasm of middle lobe, bronchus or lung: Secondary | ICD-10-CM | POA: Diagnosis not present

## 2021-03-27 DIAGNOSIS — J910 Malignant pleural effusion: Secondary | ICD-10-CM | POA: Insufficient documentation

## 2021-03-27 DIAGNOSIS — C3491 Malignant neoplasm of unspecified part of right bronchus or lung: Secondary | ICD-10-CM

## 2021-03-27 LAB — CMP (CANCER CENTER ONLY)
ALT: 29 U/L (ref 0–44)
AST: 18 U/L (ref 15–41)
Albumin: 2.2 g/dL — ABNORMAL LOW (ref 3.5–5.0)
Alkaline Phosphatase: 191 U/L — ABNORMAL HIGH (ref 38–126)
Anion gap: 7 (ref 5–15)
BUN: 21 mg/dL (ref 8–23)
CO2: 28 mmol/L (ref 22–32)
Calcium: 8.3 mg/dL — ABNORMAL LOW (ref 8.9–10.3)
Chloride: 106 mmol/L (ref 98–111)
Creatinine: 0.88 mg/dL (ref 0.61–1.24)
GFR, Estimated: >60 mL/min (ref >60)
Glucose, Bld: 118 mg/dL — ABNORMAL HIGH (ref 70–99)
Potassium: 3.7 mmol/L (ref 3.5–5.1)
Sodium: 141 mmol/L (ref 135–145)
Total Bilirubin: 0.4 mg/dL (ref 0.3–1.2)
Total Protein: 5.1 g/dL — ABNORMAL LOW (ref 6.5–8.1)

## 2021-03-27 LAB — CBC WITH DIFFERENTIAL (CANCER CENTER ONLY)
Abs Immature Granulocytes: 0.03 10*3/uL (ref 0.00–0.07)
Basophils Absolute: 0 10*3/uL (ref 0.0–0.1)
Basophils Relative: 0 %
Eosinophils Absolute: 0 10*3/uL (ref 0.0–0.5)
Eosinophils Relative: 1 %
HCT: 40.2 % (ref 39.0–52.0)
Hemoglobin: 13.2 g/dL (ref 13.0–17.0)
Immature Granulocytes: 1 %
Lymphocytes Relative: 12 %
Lymphs Abs: 0.7 10*3/uL (ref 0.7–4.0)
MCH: 30.8 pg (ref 26.0–34.0)
MCHC: 32.8 g/dL (ref 30.0–36.0)
MCV: 93.7 fL (ref 80.0–100.0)
Monocytes Absolute: 0.3 10*3/uL (ref 0.1–1.0)
Monocytes Relative: 5 %
Neutro Abs: 5.1 10*3/uL (ref 1.7–7.7)
Neutrophils Relative %: 81 %
Platelet Count: 142 10*3/uL — ABNORMAL LOW (ref 150–400)
RBC: 4.29 MIL/uL (ref 4.22–5.81)
RDW: 13.8 % (ref 11.5–15.5)
WBC Count: 6.3 10*3/uL (ref 4.0–10.5)
nRBC: 0 % (ref 0.0–0.2)

## 2021-03-27 MED ORDER — ACETAMINOPHEN 325 MG PO TABS
650.0000 mg | ORAL_TABLET | Freq: Once | ORAL | Status: AC
  Administered 2021-03-27: 650 mg via ORAL

## 2021-03-27 MED ORDER — DIPHENHYDRAMINE HCL 25 MG PO CAPS
ORAL_CAPSULE | ORAL | Status: AC
  Filled 2021-03-27: qty 2

## 2021-03-27 MED ORDER — ONDANSETRON HCL 4 MG/2ML IJ SOLN
INTRAMUSCULAR | Status: AC
  Filled 2021-03-27: qty 4

## 2021-03-27 MED ORDER — SODIUM CHLORIDE 0.9% FLUSH
10.0000 mL | INTRAVENOUS | Status: DC | PRN
  Administered 2021-03-27: 10 mL
  Filled 2021-03-27: qty 10

## 2021-03-27 MED ORDER — HEPARIN SOD (PORK) LOCK FLUSH 100 UNIT/ML IV SOLN
500.0000 [IU] | Freq: Once | INTRAVENOUS | Status: AC | PRN
  Administered 2021-03-27: 500 [IU]
  Filled 2021-03-27: qty 5

## 2021-03-27 MED ORDER — FAMOTIDINE 20 MG IN NS 100 ML IVPB
20.0000 mg | Freq: Once | INTRAVENOUS | Status: AC
  Administered 2021-03-27: 20 mg via INTRAVENOUS

## 2021-03-27 MED ORDER — ONDANSETRON HCL 4 MG/2ML IJ SOLN
8.0000 mg | Freq: Once | INTRAMUSCULAR | Status: AC
  Administered 2021-03-27: 8 mg via INTRAVENOUS

## 2021-03-27 MED ORDER — SODIUM CHLORIDE 0.9 % IV SOLN
Freq: Once | INTRAVENOUS | Status: AC
  Filled 2021-03-27: qty 250

## 2021-03-27 MED ORDER — DIPHENHYDRAMINE HCL 25 MG PO CAPS
50.0000 mg | ORAL_CAPSULE | Freq: Once | ORAL | Status: AC
Start: 2021-03-27 — End: 2021-03-27
  Administered 2021-03-27: 50 mg via ORAL

## 2021-03-27 MED ORDER — SODIUM CHLORIDE 0.9 % IV SOLN
1400.0000 mg | Freq: Once | INTRAVENOUS | Status: AC
  Administered 2021-03-27: 1400 mg via INTRAVENOUS
  Filled 2021-03-27: qty 28

## 2021-03-27 MED ORDER — FAMOTIDINE 20 MG IN NS 100 ML IVPB
INTRAVENOUS | Status: AC
  Filled 2021-03-27: qty 100

## 2021-03-27 MED ORDER — SODIUM CHLORIDE 0.9 % IV SOLN
10.0000 mg | Freq: Once | INTRAVENOUS | Status: AC
  Administered 2021-03-27: 10 mg via INTRAVENOUS
  Filled 2021-03-27: qty 10

## 2021-03-27 MED ORDER — ACETAMINOPHEN 325 MG PO TABS
ORAL_TABLET | ORAL | Status: AC
  Filled 2021-03-27: qty 2

## 2021-03-27 NOTE — Patient Instructions (Signed)
[facility]  Discharge Instructions: Thank you for choosing [facility] to provide your oncology and hematology care.   If you have a lab appointment with the [facility], please go directly to the [facility] and check in at the registration area.   Wear comfortable clothing and clothing appropriate for easy access to any Portacath or PICC line.   We strive to give you quality time with your provider. You may need to reschedule your appointment if you arrive late (15 or more minutes).  Arriving late affects you and other patients whose appointments are after yours.  Also, if you miss three or more appointments without notifying the office, you may be dismissed from the clinic at the provider's discretion.      For prescription refill requests, have your pharmacy contact our office and allow 72 hours for refills to be completed.    Today you received the following chemotherapy and/or immunotherapy agents : Rybrevant     To help prevent nausea and vomiting after your treatment, we encourage you to take your nausea medication as directed.  BELOW ARE SYMPTOMS THAT SHOULD BE REPORTED IMMEDIATELY: *FEVER GREATER THAN 100.4 F (38 C) OR HIGHER *CHILLS OR SWEATING *NAUSEA AND VOMITING THAT IS NOT CONTROLLED WITH YOUR NAUSEA MEDICATION *UNUSUAL SHORTNESS OF BREATH *UNUSUAL BRUISING OR BLEEDING *URINARY PROBLEMS (pain or burning when urinating, or frequent urination) *BOWEL PROBLEMS (unusual diarrhea, constipation, pain near the anus) TENDERNESS IN MOUTH AND THROAT WITH OR WITHOUT PRESENCE OF ULCERS (sore throat, sores in mouth, or a toothache) UNUSUAL RASH, SWELLING OR PAIN  UNUSUAL VAGINAL DISCHARGE OR ITCHING   Items with * indicate a potential emergency and should be followed up as soon as possible or go to the Emergency Department if any problems should occur.  Please show the CHEMOTHERAPY ALERT CARD or IMMUNOTHERAPY ALERT CARD at check-in to  the Emergency Department and triage nurse.  Should you have questions after your visit or need to cancel or reschedule your appointment, please contact [facility]  Dept: [phone]  and follow the prompts.  Office hours are 8:00 a.m. to 4:30 p.m. Monday - Friday. Please note that voicemails left after 4:00 p.m. may not be returned until the following business day.  We are closed weekends and major holidays. You have access to a nurse at all times for urgent questions. Please call the main number to the clinic Dept: [phone] and follow the prompts.   For any non-urgent questions, you may also contact your provider using MyChart. We now offer e-Visits for anyone 56 and older to request care online for non-urgent symptoms. For details visit [email or web address].   Also download the MyChart app! Go to the app store, search "MyChart", open the app, select [facility], and log in with your MyChart username and password.  Due to Covid, a mask is required upon entering the hospital/clinic. If you do not have a mask, one will be given to you upon arrival. For doctor visits, patients may have 1 support person aged 110 or older with them. For treatment visits, patients cannot have anyone with them due to current Covid guidelines and our immunocompromised population.

## 2021-03-28 ENCOUNTER — Ambulatory Visit (HOSPITAL_COMMUNITY)
Admission: RE | Admit: 2021-03-28 | Discharge: 2021-03-28 | Disposition: A | Discharge status: Home / Self Care | Payer: Medicare Other | Source: Ambulatory Visit | Attending: Physician Assistant | Admitting: Physician Assistant

## 2021-03-28 ENCOUNTER — Telehealth: Payer: Self-pay | Admitting: Medical Oncology

## 2021-03-28 ENCOUNTER — Other Ambulatory Visit: Payer: Self-pay | Admitting: Physician Assistant

## 2021-03-28 DIAGNOSIS — I824Y9 Acute embolism and thrombosis of unspecified deep veins of unspecified proximal lower extremity: Secondary | ICD-10-CM

## 2021-03-28 DIAGNOSIS — M79605 Pain in left leg: Secondary | ICD-10-CM | POA: Diagnosis not present

## 2021-03-28 MED ORDER — APIXABAN 5 MG PO TABS: ORAL_TABLET | ORAL | 0 refills | Status: DC

## 2021-03-28 NOTE — Progress Notes (Signed)
LLE venous duplex has been completed.  Critical findings called to Heide Guile at [facility] office.  Patient instructed to pick up prescription at pharmacy and report to ED if experiencing shortness of breath or increased pain and swelling.  Results can be found under chart review under CV PROC. 03/28/2021 12:08 PM Jeriann Sayres RVT, RDMS

## 2021-03-28 NOTE — Telephone Encounter (Signed)
Positive DVT from his L common femoral DV and all the way down. Per [facility] from vascular lab. Action- Cassie sent in Eloquis starter pack to pt pharmacy. Jeral Fruit will instruct  pt to pick up new prescription for Eloquis and start today  and call for refills.   I spoke to wife and reviewed s/s of PE and to call EMS if he has any of the following:  -acute onset of chest pain , sudden SOB   -he feels that he  cannot catch his breath  -respiratory rate is rapid and shallow, -Irreg heart rate   -a feeling of anxiety, dizziness ,light headed. He should avoid NSAIDS . She voiced understanding.

## 2021-04-02 ENCOUNTER — Inpatient Hospital Stay (HOSPITAL_BASED_OUTPATIENT_CLINIC_OR_DEPARTMENT_OTHER): Payer: Medicare Other | Admitting: Internal Medicine

## 2021-04-02 ENCOUNTER — Other Ambulatory Visit: Payer: Self-pay

## 2021-04-02 ENCOUNTER — Inpatient Hospital Stay: Payer: Medicare Other

## 2021-04-02 VITALS — BP 124/77 | HR 97 | Temp 97.3°F | Resp 19 | Ht 71.0 in | Wt 178.3 lb

## 2021-04-02 DIAGNOSIS — Z5111 Encounter for antineoplastic chemotherapy: Secondary | ICD-10-CM

## 2021-04-02 DIAGNOSIS — C7951 Secondary malignant neoplasm of bone: Secondary | ICD-10-CM | POA: Diagnosis not present

## 2021-04-02 DIAGNOSIS — C3491 Malignant neoplasm of unspecified part of right bronchus or lung: Secondary | ICD-10-CM

## 2021-04-02 DIAGNOSIS — C7931 Secondary malignant neoplasm of brain: Secondary | ICD-10-CM | POA: Diagnosis not present

## 2021-04-02 DIAGNOSIS — I824Z2 Acute embolism and thrombosis of unspecified deep veins of left distal lower extremity: Secondary | ICD-10-CM | POA: Diagnosis not present

## 2021-04-02 DIAGNOSIS — J910 Malignant pleural effusion: Secondary | ICD-10-CM | POA: Diagnosis not present

## 2021-04-02 DIAGNOSIS — C787 Secondary malignant neoplasm of liver and intrahepatic bile duct: Secondary | ICD-10-CM | POA: Diagnosis not present

## 2021-04-02 DIAGNOSIS — C342 Malignant neoplasm of middle lobe, bronchus or lung: Secondary | ICD-10-CM | POA: Diagnosis not present

## 2021-04-02 DIAGNOSIS — C3490 Malignant neoplasm of unspecified part of unspecified bronchus or lung: Secondary | ICD-10-CM

## 2021-04-02 LAB — CMP (CANCER CENTER ONLY)
ALT: 27 U/L (ref 0–44)
AST: 19 U/L (ref 15–41)
Albumin: 2.2 g/dL — ABNORMAL LOW (ref 3.5–5.0)
Alkaline Phosphatase: 219 U/L — ABNORMAL HIGH (ref 38–126)
Anion gap: 8 (ref 5–15)
BUN: 21 mg/dL (ref 8–23)
CO2: 26 mmol/L (ref 22–32)
Calcium: 8.3 mg/dL — ABNORMAL LOW (ref 8.9–10.3)
Chloride: 106 mmol/L (ref 98–111)
Creatinine: 1.06 mg/dL (ref 0.61–1.24)
GFR, Estimated: >60 mL/min (ref >60)
Glucose, Bld: 106 mg/dL — ABNORMAL HIGH (ref 70–99)
Potassium: 3.7 mmol/L (ref 3.5–5.1)
Sodium: 140 mmol/L (ref 135–145)
Total Bilirubin: 0.3 mg/dL (ref 0.3–1.2)
Total Protein: 5.3 g/dL — ABNORMAL LOW (ref 6.5–8.1)

## 2021-04-02 LAB — CBC WITH DIFFERENTIAL (CANCER CENTER ONLY)
Abs Immature Granulocytes: 0.01 10*3/uL (ref 0.00–0.07)
Basophils Absolute: 0 10*3/uL (ref 0.0–0.1)
Basophils Relative: 0 %
Eosinophils Absolute: 0.1 10*3/uL (ref 0.0–0.5)
Eosinophils Relative: 1 %
HCT: 41.4 % (ref 39.0–52.0)
Hemoglobin: 13.6 g/dL (ref 13.0–17.0)
Immature Granulocytes: 0 %
Lymphocytes Relative: 15 %
Lymphs Abs: 0.8 10*3/uL (ref 0.7–4.0)
MCH: 31.4 pg (ref 26.0–34.0)
MCHC: 32.9 g/dL (ref 30.0–36.0)
MCV: 95.6 fL (ref 80.0–100.0)
Monocytes Absolute: 0.4 10*3/uL (ref 0.1–1.0)
Monocytes Relative: 8 %
Neutro Abs: 4 10*3/uL (ref 1.7–7.7)
Neutrophils Relative %: 76 %
Platelet Count: 215 10*3/uL (ref 150–400)
RBC: 4.33 MIL/uL (ref 4.22–5.81)
RDW: 14.4 % (ref 11.5–15.5)
WBC Count: 5.3 10*3/uL (ref 4.0–10.5)
nRBC: 0 % (ref 0.0–0.2)

## 2021-04-02 LAB — MAGNESIUM: Magnesium: 2 mg/dL (ref 1.7–2.4)

## 2021-04-02 MED ORDER — ACETAMINOPHEN 325 MG PO TABS
650.0000 mg | ORAL_TABLET | Freq: Once | ORAL | Status: AC
  Administered 2021-04-02: 650 mg via ORAL

## 2021-04-02 MED ORDER — DIPHENHYDRAMINE HCL 25 MG PO CAPS
ORAL_CAPSULE | ORAL | Status: AC
  Filled 2021-04-02: qty 2

## 2021-04-02 MED ORDER — DIPHENHYDRAMINE HCL 50 MG/ML IJ SOLN
INTRAMUSCULAR | Status: AC
  Filled 2021-04-02: qty 1

## 2021-04-02 MED ORDER — FAMOTIDINE 20 MG IN NS 100 ML IVPB
20.0000 mg | Freq: Once | INTRAVENOUS | Status: AC
  Administered 2021-04-02: 20 mg via INTRAVENOUS

## 2021-04-02 MED ORDER — SODIUM CHLORIDE 0.9% FLUSH
10.0000 mL | INTRAVENOUS | Status: DC | PRN
  Administered 2021-04-02: 10 mL
  Filled 2021-04-02: qty 10

## 2021-04-02 MED ORDER — SODIUM CHLORIDE 0.9 % IV SOLN
1400.0000 mg | Freq: Once | INTRAVENOUS | Status: AC
  Administered 2021-04-02: 1400 mg via INTRAVENOUS
  Filled 2021-04-02: qty 28

## 2021-04-02 MED ORDER — ACETAMINOPHEN 325 MG PO TABS
ORAL_TABLET | ORAL | Status: AC
  Filled 2021-04-02: qty 2

## 2021-04-02 MED ORDER — ONDANSETRON HCL 4 MG/2ML IJ SOLN
8.0000 mg | Freq: Once | INTRAMUSCULAR | Status: AC
  Administered 2021-04-02: 8 mg via INTRAVENOUS

## 2021-04-02 MED ORDER — ONDANSETRON HCL 4 MG/2ML IJ SOLN
INTRAMUSCULAR | Status: AC
  Filled 2021-04-02: qty 4

## 2021-04-02 MED ORDER — HEPARIN SOD (PORK) LOCK FLUSH 100 UNIT/ML IV SOLN
500.0000 [IU] | Freq: Once | INTRAVENOUS | Status: AC | PRN
  Administered 2021-04-02: 500 [IU]
  Filled 2021-04-02: qty 5

## 2021-04-02 MED ORDER — SODIUM CHLORIDE 0.9 % IV SOLN
Freq: Once | INTRAVENOUS | Status: AC
  Filled 2021-04-02: qty 250

## 2021-04-02 MED ORDER — SODIUM CHLORIDE 0.9 % IV SOLN
10.0000 mg | Freq: Once | INTRAVENOUS | Status: AC
  Administered 2021-04-02: 10 mg via INTRAVENOUS
  Filled 2021-04-02: qty 10

## 2021-04-02 MED ORDER — ONDANSETRON HCL 4 MG/2ML IJ SOLN
INTRAMUSCULAR | Status: AC
  Filled 2021-04-02: qty 2

## 2021-04-02 MED ORDER — DIPHENHYDRAMINE HCL 25 MG PO CAPS
50.0000 mg | ORAL_CAPSULE | Freq: Once | ORAL | Status: AC
  Administered 2021-04-02: 50 mg via ORAL

## 2021-04-02 MED ORDER — FAMOTIDINE 20 MG IN NS 100 ML IVPB
INTRAVENOUS | Status: AC
  Filled 2021-04-02: qty 100

## 2021-04-02 NOTE — Progress Notes (Signed)
Okay to treat with pending Mag level per Dr. Julien Nordmann

## 2021-04-02 NOTE — Patient Instructions (Signed)
[facility]  Discharge Instructions: Thank you for choosing [facility] to provide your oncology and hematology care.   If you have a lab appointment with the [facility], please go directly to the [facility] and check in at the registration area.   Wear comfortable clothing and clothing appropriate for easy access to any Portacath or PICC line.   We strive to give you quality time with your provider. You may need to reschedule your appointment if you arrive late (15 or more minutes).  Arriving late affects you and other patients whose appointments are after yours.  Also, if you miss three or more appointments without notifying the office, you may be dismissed from the clinic at the provider's discretion.      For prescription refill requests, have your pharmacy contact our office and allow 72 hours for refills to be completed.    Today you received the following chemotherapy and/or immunotherapy agents rybrevant      To help prevent nausea and vomiting after your treatment, we encourage you to take your nausea medication as directed.  BELOW ARE SYMPTOMS THAT SHOULD BE REPORTED IMMEDIATELY: *FEVER GREATER THAN 100.4 F (38 C) OR HIGHER *CHILLS OR SWEATING *NAUSEA AND VOMITING THAT IS NOT CONTROLLED WITH YOUR NAUSEA MEDICATION *UNUSUAL SHORTNESS OF BREATH *UNUSUAL BRUISING OR BLEEDING *URINARY PROBLEMS (pain or burning when urinating, or frequent urination) *BOWEL PROBLEMS (unusual diarrhea, constipation, pain near the anus) TENDERNESS IN MOUTH AND THROAT WITH OR WITHOUT PRESENCE OF ULCERS (sore throat, sores in mouth, or a toothache) UNUSUAL RASH, SWELLING OR PAIN  UNUSUAL VAGINAL DISCHARGE OR ITCHING   Items with * indicate a potential emergency and should be followed up as soon as possible or go to the Emergency Department if any problems should occur.  Please show the CHEMOTHERAPY ALERT CARD or IMMUNOTHERAPY ALERT CARD at check-in to  the Emergency Department and triage nurse.  Should you have questions after your visit or need to cancel or reschedule your appointment, please contact [facility]  Dept: [phone]  and follow the prompts.  Office hours are 8:00 a.m. to 4:30 p.m. Monday - Friday. Please note that voicemails left after 4:00 p.m. may not be returned until the following business day.  We are closed weekends and major holidays. You have access to a nurse at all times for urgent questions. Please call the main number to the clinic Dept: [phone] and follow the prompts.   For any non-urgent questions, you may also contact your provider using MyChart. We now offer e-Visits for anyone 34 and older to request care online for non-urgent symptoms. For details visit [email or web address].   Also download the MyChart app! Go to the app store, search "MyChart", open the app, select [facility], and log in with your MyChart username and password.  Due to Covid, a mask is required upon entering the hospital/clinic. If you do not have a mask, one will be given to you upon arrival. For doctor visits, patients may have 1 support person aged 43 or older with them. For treatment visits, patients cannot have anyone with them due to current Covid guidelines and our immunocompromised population.

## 2021-04-02 NOTE — Progress Notes (Signed)
Ethan Brown [phone]   [phone]  OFFICE PROGRESS NOTE  Laurey Morale, MD [place] [address]  DIAGNOSIS: Stage IV (T2b, N2, M1c) presented with right middle lobe lung mass in addition to mediastinal lymphadenopathy as well as malignant right pleural effusion with pleural-based nodules and suspicious right hepatic lesion and the brain metastasis diagnosed in July 2020.  Molecular Biomarkers: [id number] (Exon 20 insertion) 0.2% Dacomitinib,Neratinib,Osimertinib  [id number] 0.1% None   PRIOR THERAPY:  1) Systemic chemotherapy with carboplatin for AUC of 5, Alimta 500 mg/M2 and Keytruda 200 mg IV every 3 weeks.  First dose April 04, 2019.  Status post 21 cycles  Starting from cycle #5 the patient is on maintenance treatment with Alimta and Keytruda every 3 weeks.  Starting from cycle #19 he will be on single agent Alimta.  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 2) whole brain irradiation for multiple metastatic brain lesions. 3)  Mobocertinib (Axkibity) 458 mg p.o. daily.  First dose was June 29, 2020.  Status post 6 months of treatment.  This was discontinued secondary to disease progression in the brain as well as the chest. 4) treatment was targeted therapy with Tagrisso 80 mg p.o. daily status post 7 weeks of treatment discontinued secondary to disease progression.  CURRENT THERAPY: Amivantamab 350 Mg IV on day 1 followed by 1050 Mg IV on day 2 of cycle #1 started on March 12, 2021 status post 1 cycle.  Starting from cycle #2 his dose will be 1400 Mg IV weekly for 3 cycles followed by 1400 Mg IV every 2 weeks starting from cycle #5.  Status post 3 cycles.  INTERVAL HISTORY: Ethan Brown 70 y.o. male returns to the clinic today for follow-up visit accompanied by his daughter Ethan Brown.  The patient is feeling fine today with no concerning complaints except for  the dry cough that comes and episodes.  He denied having any current chest pain, shortness of breath except with exertion and no hemoptysis.  He had swelling of the left lower extremity and Doppler of the lower extremity showed deep venous thrombosis of the left lower extremity and the patient is started treatment with Eliquis on 03/28/2021.  He denied having any bleeding issues.  He has no nausea, vomiting, diarrhea or constipation.  He denied having any headache or visual changes.  He has no recent weight loss or night sweats.  He is now tolerating his treatment with Amivantamab fairly well.  He is here for evaluation before starting cycle #4.   MEDICAL HISTORY: Past Medical History:  Diagnosis Date   Borderline hypertension    Brain metastasis ([place]) dx'd 01/2019   Detached vitreous humor, left    Dyspnea    ED (erectile dysfunction) of organic origin    Hyperlipidemia    Hypertension    Lens subluxation, left    Lung cancer ([place]) dx'd 01/2019   Stage IV   Migraine headache    takes PRN Imitrex   Pseudophakia    TGA (transient global amnesia) 2017    ALLERGIES:  has No Known Allergies.  MEDICATIONS:  Current Outpatient Medications  Medication Sig Dispense Refill   amivantamab-vmjw 1,050 mg in sodium chloride 0.9 % 250 mL Inject 1,050 mg into the vein once.     apixaban (ELIQUIS) 5 MG TABS tablet Take 2 tablets (8m) twice daily for 7 days, then 1 tablet (562m twice daily 60 tablet 0  cycloSPORINE (RESTASIS) 0.05 % ophthalmic emulsion Place 1 drop into both eyes 2 (two) times daily.     doxycycline (VIBRAMYCIN) 100 MG capsule Take 1 capsule (100 mg total) by mouth 2 (two) times daily. 14 capsule 0   hydrochlorothiazide (MICROZIDE) 12.5 MG capsule TAKE 1 CAPSULE BY MOUTH EVERY DAY (Patient taking differently: Take 12.5 mg by mouth every other day.) 90 capsule 3   HYDROcodone bit-homatropine (HYCODAN) 5-1.5 MG/5ML syrup Take 5 mLs by mouth every 6 (six) hours as needed for cough. 473 mL  0   hydrocortisone 1 % lotion Apply 1 application topically 2 (two) times daily. 113 g 0   lidocaine-prilocaine (EMLA) cream Apply to the Port-A-Cath site 30-60 minutes before treatment 30 g 0   LORazepam (ATIVAN) 1 MG tablet Take 1 tablet (1 mg total) by mouth as needed for anxiety (30 minutes prior to radiation treatment and may repeat just prior to procedure if needed.). (Patient not taking: Reported on 03/19/2021) 10 tablet 0   magic mouthwash SOLN Take 5 mLs by mouth 4 (four) times daily as needed for mouth pain. Swish and spit or swallow 240 mL 0   memantine (NAMENDA) 10 MG tablet TAKE 1 TABLET BY MOUTH TWICE A DAY (Patient taking differently: Take 10 mg by mouth 2 (two) times daily.) 180 tablet 2   mirtazapine (REMERON) 15 MG tablet Take 1 tablet (15 mg total) by mouth at bedtime. 30 tablet 2   omeprazole (PRILOSEC) 20 MG capsule Take 20 mg by mouth daily.     prochlorperazine (COMPAZINE) 10 MG tablet Take 1 tablet (10 mg total) by mouth every 6 (six) hours as needed for nausea or vomiting. (Patient not taking: Reported on 03/19/2021) 30 tablet 1   sildenafil (REVATIO) 20 MG tablet TAKE AS DIRECTED (Patient taking differently: 20 mg. Take as directed) 10 tablet 11   tamsulosin (FLOMAX) 0.4 MG CAPS capsule Take 0.4 mg by mouth daily.     No current facility-administered medications for this visit.    SURGICAL HISTORY:  Past Surgical History:  Procedure Laterality Date   CAROTID DOPPLERS  09/2017   Mild non-obstructive disease   CATARACT EXTRACTION, BILATERAL     cataract, left  2018   CHEST TUBE INSERTION Right 03/29/2019   Procedure: INSERTION PLEURAL DRAINAGE CATHETER;  Surgeon: [name], MD;  Location: [facility] OR;  Service: Thoracic;  Laterality: Right;   COLONOSCOPY  2016   clear, repeat in 10 yrs    Eye surgeries     , Lens attachment.  Vitrectomy   FEMORAL HERNIA REPAIR     HERNIA REPAIR     IR THORACENTESIS ASP PLEURAL SPACE W/IMG GUIDE  02/22/2019   IR THORACENTESIS  ASP PLEURAL SPACE W/IMG GUIDE  03/13/2019   IR THORACENTESIS ASP PLEURAL SPACE W/IMG GUIDE  03/18/2020   LUMBAR LAMINECTOMY     PLEURAL BIOPSY Right 03/29/2019   Procedure: PLEURAL BIOPSY;  Surgeon: [name], MD;  Location: [place];  Service: Thoracic;  Laterality: Right;   PORTACATH PLACEMENT N/A 03/29/2019   Procedure: INSERTION PORT-A-CATH;  Surgeon: [name], MD;  Location: [place];  Service: Thoracic;  Laterality: N/A;   retinal attachment, right     sclearl buckle, right     TRANSTHORACIC ECHOCARDIOGRAM  12/14/2019   EF 50 EF 55% (low normal).  Abnormal septal wall motion related to BBB; indeterminate diastolic parameters.  Mild RV enlargement with mildly reduced function.  Normal valves.  Normal atrial sizes.   victrectomy,right  VIDEO ASSISTED THORACOSCOPY Right 03/29/2019   Procedure: VIDEO ASSISTED THORACOSCOPY;  Surgeon: [name], MD;  Location: [facility] OR;  Service: Thoracic;  Laterality: Right;    REVIEW OF SYSTEMS:  Constitutional: positive for fatigue Eyes: negative Ears, nose, mouth, throat, and face: negative Respiratory: positive for cough and dyspnea on exertion Cardiovascular: negative Gastrointestinal: negative Genitourinary:negative Integument/breast: negative Hematologic/lymphatic: negative Musculoskeletal:positive for muscle weakness Neurological: positive for weakness Behavioral/Psych: negative Endocrine: negative Allergic/Immunologic: negative   PHYSICAL EXAMINATION: General appearance: alert, cooperative, fatigued, and no distress Head: Normocephalic, without obvious abnormality, atraumatic Neck: no adenopathy, no JVD, supple, symmetrical, trachea midline, and thyroid not enlarged, symmetric, no tenderness/mass/nodules Lymph nodes: Cervical, supraclavicular, and axillary nodes normal. Resp: clear to auscultation bilaterally Back: symmetric, no curvature. ROM normal. No CVA tenderness. Cardio: regular rate and rhythm, S1,  S2 normal, no murmur, click, rub or gallop GI: soft, non-tender; bowel sounds normal; no masses,  no organomegaly Extremities: edema 1+ edema in the left lower extremity Neurologic: Alert and oriented X 3, normal strength and tone. Normal symmetric reflexes. Normal coordination and gait  ECOG PERFORMANCE STATUS: 1 - Symptomatic but completely ambulatory  Blood pressure 124/77, pulse 97, temperature (!) 97.3 F (36.3 C), temperature source Tympanic, resp. rate 19, height 5' 11" (1.803 m), weight 178 lb 4.8 oz (80.9 kg), SpO2 99 %.  LABORATORY DATA: Lab Results  Component Value Date   WBC 5.3 04/02/2021   HGB 13.6 04/02/2021   HCT 41.4 04/02/2021   MCV 95.6 04/02/2021   PLT 215 04/02/2021      Chemistry      Component Value Date/Time   NA 141 03/27/2021 0818   K 3.7 03/27/2021 0818   CL 106 03/27/2021 0818   CO2 28 03/27/2021 0818   BUN 21 03/27/2021 0818   CREATININE 0.88 03/27/2021 0818      Component Value Date/Time   CALCIUM 8.3 (L) 03/27/2021 0818   ALKPHOS 191 (H) 03/27/2021 0818   AST 18 03/27/2021 0818   ALT 29 03/27/2021 0818   BILITOT 0.4 03/27/2021 0818       RADIOGRAPHIC STUDIES: DG Chest 1 View  Result Date: 03/17/2021 CLINICAL DATA:  70 year old male with history of lung malignancy, status post thoracentesis. EXAM: CHEST  1 VIEW COMPARISON:  Chest radiograph, 03/14/2021.  CT chest, 01/02/2021. FINDINGS: Cardiac silhouette is within normal limits. The left lung is well inflated. Consolidation with silhouetting of the right hemidiaphragm, unchanged. Moderate volume right, and trace left pleural effusion. No pneumothorax. Right chest port. No acute osseous abnormality. IMPRESSION: 1. No pneumothorax. 2. Moderate volume right pleural effusion with associated right basilar consolidation, unchanged. Electronically Signed   By: Michaelle Birks MD   On: 03/17/2021 13:22   CT Head Wo Contrast  Result Date: 03/14/2021 CLINICAL DATA:  Delirium; history of metastatic lung  cancer. EXAM: CT HEAD WITHOUT CONTRAST TECHNIQUE: Contiguous axial images were obtained from the base of the skull through the vertex without intravenous contrast. COMPARISON:  December 05, 2020 FINDINGS: Brain: No evidence of acute infarction, hemorrhage or hydrocephalus. Known multifocal intracranial metastatic disease is not well visualized with current modality. Vascular: No hyperdense vessel or unexpected calcification. Skull: No acute fracture. Sinuses/Orbits: No acute finding. Other: None. IMPRESSION: No acute intracranial abnormality. Known intracranial metastatic disease is not well visualized on CT. Recommend further evaluation with dedicated MRI for improved comparison. Electronically Signed   By: Valentino Saxon MD   On: 03/14/2021 16:13   DG Chest Port 1 View  Result Date: 03/14/2021  CLINICAL DATA:  Altered mental status. suppose to have a thoracentesis today, but missed the appointment. Patient's wife reports that when she got home with the patient today, he would only stare at her. EXAM: PORTABLE CHEST 1 VIEW.  Patient rotated. COMPARISON:  CT 01/02/2021.  Chest x-ray 04/09/2020 FINDINGS: Accessed right chest wall Port-A-Cath with tip overlying the expected region of the distal superior vena cava. Prominent cardiac silhouette likely due to AP technique and rotation. Otherwise heart size and mediastinal contours are within normal limits. Fullness of the right hilum. Right lower mid lung zone hazy airspace opacity. No pulmonary edema. Persistent small to moderate volume right pleural effusion. No definite left pleural effusion. No pneumothorax. No acute osseous abnormality. IMPRESSION: 1. Persistent small to moderate volume right pleural effusion. Associated right lower mid lung zone hazy airspace opacity. 2. Prominent cardiac silhouette likely due to AP technique and rotation. 3. Fullness of the right hilum. May be due to rotation versus underlying adenopathy/lesion. Electronically Signed   By:  Iven Finn M.D.   On: 03/14/2021 16:47   VAS [place] LOWER EXTREMITY VENOUS (DVT)  Result Date: 03/28/2021  Lower Venous DVT Study Patient Name:  KREW HORTMAN [facility]  Date of Exam:   03/28/2021 Medical Rec #: [id number]              Accession #:    [id number] Date of Birth: March 17, 1951               Patient Gender: M Patient Age:   7 years Exam Location:  [facility] Procedure:      VAS [place] LOWER EXTREMITY VENOUS (DVT) Referring Phys: Roosevelt Locks --------------------------------------------------------------------------------  Indications: Swelling, and Pain.  Risk Factors: Chemotherapy Cancer (Lung) with mets. Comparison Study: No previous exams Performing Technologist: Jody Hill RVT, RDMS  Examination Guidelines: A complete evaluation includes B-mode imaging, spectral Doppler, color Doppler, and power Doppler as needed of all accessible portions of each vessel. Bilateral testing is considered an integral part of a complete examination. Limited examinations for reoccurring indications may be performed as noted. The reflux portion of the exam is performed with the patient in reverse Trendelenburg.  +-----+---------------+---------+-----------+----------+--------------+ RIGHTCompressibilityPhasicitySpontaneityPropertiesThrombus Aging +-----+---------------+---------+-----------+----------+--------------+ CFV  Full           Yes      Yes                                 +-----+---------------+---------+-----------+----------+--------------+   +---------+---------------+---------+-----------+----------+--------------+ LEFT     CompressibilityPhasicitySpontaneityPropertiesThrombus Aging +---------+---------------+---------+-----------+----------+--------------+ CFV      None           No       No                   Acute          +---------+---------------+---------+-----------+----------+--------------+ SFJ      None                                                         +---------+---------------+---------+-----------+----------+--------------+ FV Prox  None           No       No                   Acute          +---------+---------------+---------+-----------+----------+--------------+  FV Mid   None           No       No                   Acute          +---------+---------------+---------+-----------+----------+--------------+ FV DistalNone           No       No                   Acute          +---------+---------------+---------+-----------+----------+--------------+ PFV      None           No       No                   Acute          +---------+---------------+---------+-----------+----------+--------------+ POP      None           No       No                   Acute          +---------+---------------+---------+-----------+----------+--------------+ PTV      None           No       No                   Acute          +---------+---------------+---------+-----------+----------+--------------+ PERO     None           No       No                   Acute          +---------+---------------+---------+-----------+----------+--------------+ Gastroc  None           No       No                   Acute          +---------+---------------+---------+-----------+----------+--------------+ SSV      None           No       No                   Acute          +---------+---------------+---------+-----------+----------+--------------+ EIV      Full           Yes      Yes                                 +---------+---------------+---------+-----------+----------+--------------+     Summary: RIGHT: - No evidence of common femoral vein obstruction.  LEFT: - Findings consistent with acute deep vein thrombosis involving the left common femoral vein, SF junction, left femoral vein, left proximal profunda vein, left popliteal vein, left posterior tibial veins, left peroneal veins, and left gastrocnemius veins. - Findings  consistent with acute superficial vein thrombosis involving the left small saphenous vein. - No cystic structure found in the popliteal fossa.  *See table(s) above for measurements and observations. Electronically signed by Jamelle Haring on 03/28/2021 at 5:34:31 PM.    Final    US Thoracentesis Asp Pleural space w/IMG guide  Result Date: 03/17/2021 INDICATION: Patient with history of stage IV right lung cancer  with recurrent malignant right pleural effusion; request received for therapeutic right thoracentesis. EXAM: ULTRASOUND GUIDED THERAPEUTIC RIGHT THORACENTESIS MEDICATIONS: 1% lidocaine to skin and subcutaneous tissue COMPLICATIONS: None immediate. PROCEDURE: An ultrasound guided thoracentesis was thoroughly discussed with the patient and questions answered. The benefits, risks, alternatives and complications were also discussed. The patient understands and wishes to proceed with the procedure. Written consent was obtained. Ultrasound was performed to localize and mark an adequate pocket of fluid in the right chest. The area was then prepped and draped in the normal sterile fashion. 1% Lidocaine was used for local anesthesia. Under ultrasound guidance a 6 Fr Safe-T-Centesis catheter was introduced. Thoracentesis was performed. The catheter was removed and a dressing applied. FINDINGS: A total of approximately 350 cc of dark brown fluid was removed. Due to patient chest discomfort only the above amount of fluid was removed today. IMPRESSION: Successful ultrasound guided therapeutic right thoracentesis yielding 350 cc of pleural fluid. Read by: Rowe Robert, PA-C Electronically Signed   By: Michaelle Birks MD   On: 03/17/2021 13:04     ASSESSMENT AND PLAN: This is a very pleasant 70 years old white male recently diagnosed with a stage IV (T2b, N2, M1C) non-small cell lung cancer, adenocarcinoma with positive EGFR mutation in exon 20 (resistant mutation) diagnosed in July 2020 and presented with extensive  disease involving the right upper lobe as well as the right middle and lower lobe with extensive pleural based metastasis as well as mediastinal and hilar disease with malignant pleural effusion as well as liver and brain metastasis. Unfortunately the patient has no actionable mutations based on the molecular studies by guardant 360.   The patient is currently undergoing systemic chemotherapy with carboplatin for AUC of 5, Alimta 500 mg/M2 and Keytruda 200 mg IV every 3 weeks status post 21 cycles.  Starting from cycle #5 the patient is on maintenance treatment with Alimta and Keytruda. Beryle Flock was discontinued after cycle #18 secondary to recurrent immunotherapy mediated pneumonitis. Starting from cycle #19 the patient will be on treatment with single agent Alimta every 3 weeks. The patient has been tolerating the treatment well. He had repeat PET scan and MRI of the brain performed recently.  The PET scan showed evidence for disease progression with metastatic disease involving the thoracic and abdominal lymph nodes as well as the right hemithorax and bones.  He also has loculated moderate right pleural effusion and small left pleural effusion.  The PET scan also showed prostate enlargement and his PSA has been rising recently.  He was seen by urology. MRI of the brain showed development of new 5 mm right temporal lesion suspicious for metastatic disease as well as new 2 mm focus of enhancement in the left periventricular white matter also suspicious for metastatic disease.  He was seen by radiation oncology and expected to have [place] to this lesion soon. The patient started treatment with Mobocertinib (Axkibity) 893 mg p.o. daily.  Status post 6 months of treatment.  He has been tolerating the treatment well with no concerning adverse effects except for the dryness of his nose that is not responding to the over-the-counter medication.  His treatment was discontinued secondary to disease  progression. Unfortunately he had significant disease progression in the brain with innumerable brain metastasis in addition to previous increase in some of the pulmonary nodules seen on the previous imaging studies.  He completed a course of whole brain irradiation. The patient was treated with Tagrisso 80 mg p.o. daily status post 7  weeks of treatment discontinued secondary to disease progression. The patient started last week the first dose of his treatment with Amivantamab with reduced dose of 350 Mg IV on day 1 and 1050 Mg IV on day 2.  He has hypersensitivity reaction on day 1 required a lot of premedication and treatment with Solu-Medrol, Pepcid and Benadryl as well as Demerol.  He started tolerating his treatment fairly well with no concerning adverse effects starting from cycle #2. He is currently status post 3 cycles.  The patient has no complaints today except for the swelling of the left lower extremity secondary to the venous thrombosis. I recommended for him to proceed with cycle #4 of his treatment today as planned. For the left lower extremity deep venous thrombosis, the patient started treatment with Eliquis on 03/28/2021. For the lack of appetite, he is feeling much better now and gained few pounds since his last visit. For the cough he will continue his current cough medication. The patient will come back for follow-up visit in 3 weeks for evaluation before starting cycle #6. He was advised to call immediately if he has any concerning symptoms in the interval.  The patient voices understanding of current disease status and treatment options and is in agreement with the current care plan. All questions were answered. The patient knows to call the clinic with any problems, questions or concerns. We can certainly see the patient much sooner if necessary.  Disclaimer: This note was dictated with voice recognition software. Similar sounding words can inadvertently be transcribed and may  not be corrected upon review.

## 2021-04-03 ENCOUNTER — Ambulatory Visit
Admission: RE | Admit: 2021-04-03 | Discharge: 2021-04-03 | Disposition: A | Discharge status: Home / Self Care | Payer: Medicare Other | Source: Ambulatory Visit | Attending: Radiation Oncology | Admitting: Radiation Oncology

## 2021-04-03 DIAGNOSIS — C7931 Secondary malignant neoplasm of brain: Secondary | ICD-10-CM | POA: Diagnosis not present

## 2021-04-03 DIAGNOSIS — C3490 Malignant neoplasm of unspecified part of unspecified bronchus or lung: Secondary | ICD-10-CM | POA: Diagnosis not present

## 2021-04-03 MED ORDER — GADOBENATE DIMEGLUMINE 529 MG/ML IV SOLN
17.0000 mL | Freq: Once | INTRAVENOUS | Status: AC | PRN
  Administered 2021-04-03: 17 mL via INTRAVENOUS

## 2021-04-04 ENCOUNTER — Encounter: Payer: Self-pay | Admitting: Urology

## 2021-04-04 NOTE — Progress Notes (Signed)
Patient states no headaches, no skin irritation, no vision, hearing, memory, speech changes, or nausea. Good motor skills and energy.  Meaningful use questions complete and patient notified of his 9:30am appointment on 04/09/21 and expressed understanding of it.

## 2021-04-07 ENCOUNTER — Inpatient Hospital Stay: Payer: Medicare Other

## 2021-04-08 ENCOUNTER — Other Ambulatory Visit: Payer: Self-pay | Admitting: Radiation Therapy

## 2021-04-08 DIAGNOSIS — C7931 Secondary malignant neoplasm of brain: Secondary | ICD-10-CM

## 2021-04-08 MED FILL — Dexamethasone Sodium Phosphate Inj 100 MG/10ML: INTRAMUSCULAR | Qty: 1 | Status: AC

## 2021-04-08 NOTE — Progress Notes (Signed)
Orders placed for November brain MRI port access at [facility].   Mont Dutton R.T.(R)(T) Radiation Special Procedures Navigator

## 2021-04-09 ENCOUNTER — Inpatient Hospital Stay: Payer: Medicare Other

## 2021-04-09 ENCOUNTER — Other Ambulatory Visit: Payer: Self-pay | Admitting: Internal Medicine

## 2021-04-09 ENCOUNTER — Other Ambulatory Visit: Payer: Self-pay

## 2021-04-09 ENCOUNTER — Encounter: Payer: Self-pay | Admitting: Internal Medicine

## 2021-04-09 ENCOUNTER — Ambulatory Visit
Admission: RE | Admit: 2021-04-09 | Discharge: 2021-04-09 | Disposition: A | Discharge status: Home / Self Care | Payer: Medicare Other | Source: Ambulatory Visit | Attending: Urology | Admitting: Urology

## 2021-04-09 ENCOUNTER — Inpatient Hospital Stay: Payer: Medicare Other | Admitting: Dietician

## 2021-04-09 VITALS — BP 117/76 | HR 99 | Temp 97.6°F | Resp 18 | Wt 182.3 lb

## 2021-04-09 DIAGNOSIS — C3491 Malignant neoplasm of unspecified part of right bronchus or lung: Secondary | ICD-10-CM

## 2021-04-09 DIAGNOSIS — Z95828 Presence of other vascular implants and grafts: Secondary | ICD-10-CM

## 2021-04-09 DIAGNOSIS — C342 Malignant neoplasm of middle lobe, bronchus or lung: Secondary | ICD-10-CM | POA: Diagnosis not present

## 2021-04-09 DIAGNOSIS — C7931 Secondary malignant neoplasm of brain: Secondary | ICD-10-CM | POA: Diagnosis not present

## 2021-04-09 DIAGNOSIS — C7951 Secondary malignant neoplasm of bone: Secondary | ICD-10-CM | POA: Diagnosis not present

## 2021-04-09 DIAGNOSIS — C787 Secondary malignant neoplasm of liver and intrahepatic bile duct: Secondary | ICD-10-CM | POA: Diagnosis not present

## 2021-04-09 DIAGNOSIS — J910 Malignant pleural effusion: Secondary | ICD-10-CM | POA: Diagnosis not present

## 2021-04-09 DIAGNOSIS — Z5111 Encounter for antineoplastic chemotherapy: Secondary | ICD-10-CM | POA: Diagnosis not present

## 2021-04-09 DIAGNOSIS — C3490 Malignant neoplasm of unspecified part of unspecified bronchus or lung: Secondary | ICD-10-CM

## 2021-04-09 LAB — CBC WITH DIFFERENTIAL (CANCER CENTER ONLY)
Abs Immature Granulocytes: 0.01 10*3/uL (ref 0.00–0.07)
Basophils Absolute: 0 10*3/uL (ref 0.0–0.1)
Basophils Relative: 1 %
Eosinophils Absolute: 0 10*3/uL (ref 0.0–0.5)
Eosinophils Relative: 1 %
HCT: 39.3 % (ref 39.0–52.0)
Hemoglobin: 13.1 g/dL (ref 13.0–17.0)
Immature Granulocytes: 0 %
Lymphocytes Relative: 17 %
Lymphs Abs: 0.7 10*3/uL (ref 0.7–4.0)
MCH: 31.3 pg (ref 26.0–34.0)
MCHC: 33.3 g/dL (ref 30.0–36.0)
MCV: 94 fL (ref 80.0–100.0)
Monocytes Absolute: 0.4 10*3/uL (ref 0.1–1.0)
Monocytes Relative: 8 %
Neutro Abs: 3.1 10*3/uL (ref 1.7–7.7)
Neutrophils Relative %: 73 %
Platelet Count: 219 10*3/uL (ref 150–400)
RBC: 4.18 MIL/uL — ABNORMAL LOW (ref 4.22–5.81)
RDW: 14.6 % (ref 11.5–15.5)
WBC Count: 4.3 10*3/uL (ref 4.0–10.5)
nRBC: 0 % (ref 0.0–0.2)

## 2021-04-09 LAB — CMP (CANCER CENTER ONLY)
ALT: 22 U/L (ref 0–44)
AST: 18 U/L (ref 15–41)
Albumin: 2.2 g/dL — ABNORMAL LOW (ref 3.5–5.0)
Alkaline Phosphatase: 192 U/L — ABNORMAL HIGH (ref 38–126)
Anion gap: 8 (ref 5–15)
BUN: 22 mg/dL (ref 8–23)
CO2: 25 mmol/L (ref 22–32)
Calcium: 8 mg/dL — ABNORMAL LOW (ref 8.9–10.3)
Chloride: 107 mmol/L (ref 98–111)
Creatinine: 0.84 mg/dL (ref 0.61–1.24)
GFR, Estimated: >60 mL/min (ref >60)
Glucose, Bld: 124 mg/dL — ABNORMAL HIGH (ref 70–99)
Potassium: 3.8 mmol/L (ref 3.5–5.1)
Sodium: 140 mmol/L (ref 135–145)
Total Bilirubin: 0.3 mg/dL (ref 0.3–1.2)
Total Protein: 5.1 g/dL — ABNORMAL LOW (ref 6.5–8.1)

## 2021-04-09 LAB — MAGNESIUM: Magnesium: 1.9 mg/dL (ref 1.7–2.4)

## 2021-04-09 MED ORDER — DIPHENHYDRAMINE HCL 25 MG PO CAPS
50.0000 mg | ORAL_CAPSULE | Freq: Once | ORAL | Status: AC
  Administered 2021-04-09: 50 mg via ORAL
  Filled 2021-04-09: qty 2

## 2021-04-09 MED ORDER — SODIUM CHLORIDE 0.9% FLUSH
10.0000 mL | INTRAVENOUS | Status: DC | PRN
Start: 2021-04-09 — End: 2021-04-09
  Administered 2021-04-09: 10 mL

## 2021-04-09 MED ORDER — SODIUM CHLORIDE 0.9% FLUSH
10.0000 mL | INTRAVENOUS | Status: DC | PRN
  Administered 2021-04-09: 10 mL

## 2021-04-09 MED ORDER — HEPARIN SOD (PORK) LOCK FLUSH 100 UNIT/ML IV SOLN
500.0000 [IU] | Freq: Once | INTRAVENOUS | Status: AC | PRN
  Administered 2021-04-09: 500 [IU]

## 2021-04-09 MED ORDER — FAMOTIDINE 20 MG IN NS 100 ML IVPB
20.0000 mg | Freq: Once | INTRAVENOUS | Status: AC
  Administered 2021-04-09: 20 mg via INTRAVENOUS
  Filled 2021-04-09: qty 100

## 2021-04-09 MED ORDER — SODIUM CHLORIDE 0.9 % IV SOLN: Freq: Once | INTRAVENOUS | Status: AC

## 2021-04-09 MED ORDER — ONDANSETRON HCL 4 MG/2ML IJ SOLN
8.0000 mg | Freq: Once | INTRAMUSCULAR | Status: AC
  Administered 2021-04-09: 8 mg via INTRAVENOUS
  Filled 2021-04-09: qty 4

## 2021-04-09 MED ORDER — FUROSEMIDE 20 MG PO TABS: ORAL_TABLET | ORAL | 0 refills | Status: DC

## 2021-04-09 MED ORDER — SODIUM CHLORIDE 0.9 % IV SOLN
10.0000 mg | Freq: Once | INTRAVENOUS | Status: AC
  Administered 2021-04-09: 10 mg via INTRAVENOUS
  Filled 2021-04-09: qty 10

## 2021-04-09 MED ORDER — ACETAMINOPHEN 325 MG PO TABS
650.0000 mg | ORAL_TABLET | Freq: Once | ORAL | Status: AC
Start: 2021-04-09 — End: 2021-04-09
  Administered 2021-04-09: 650 mg via ORAL
  Filled 2021-04-09: qty 2

## 2021-04-09 MED ORDER — SODIUM CHLORIDE 0.9 % IV SOLN
1400.0000 mg | Freq: Once | INTRAVENOUS | Status: AC
  Administered 2021-04-09: 1400 mg via INTRAVENOUS
  Filled 2021-04-09 (×2): qty 28

## 2021-04-09 NOTE — Progress Notes (Signed)
As I was discharging patient he expressed concerns about swelling in left leg > than his right leg. States he is currently on a blood thinner. Dr. Julien Nordmann came up to speak with patient.

## 2021-04-09 NOTE — Progress Notes (Signed)
Nutrition Follow-up:  Patient with stage IV lung cancer with brain metastases. He is s/p whole brain radiation 12/31/20. Patient currently receiving Amivantamab.   Met with patient in infusion. Patient reports extreme fatigue, in bed for a few weeks after s/p completing radiation therapy. He reports his appetite has improved and is eating well after recovering from radiation treatment. He has been eating frequent small meals and snacks. He usually eats eggs, bacon, toast in the mornings but has eaten granola with milk the past couple to days. Patient snacks on protein bars and drinks Fairlife milk with added protein powder. He eats cod and salmon frequently. Patient reports regular bowel movements, he takes daily stool softener. Patient reports having a blood clot to LLE, swelling has increased in the last week. Patient is taking Eliquis, he has messaged MD about increased LLE edema today. Patient is asking about albumin numbers today. Patient states he watches these numbers carefully due to previous lymphedema, states he does not ever want to experience this again.   Medications: Remeron, Eliquis, Hydrocodan, Compazine, Ativan  Labs: Glucose 124, Albumin 2.2, Alkaline Phosphatase 192  Anthropometrics: Weight 182 lb 5.1 oz today increased from 174 lb 2.6 oz on 8/4 and 178 lb 4.8 oz (? Fluid)  NUTRITION DIAGNOSIS: Unintended weight loss stable   INTERVENTION:  Continue eating small frequent meals and snacks with adequate calories and protein, pt has fact sheets Continue drinking CIB with fairlife whole milk for added calories and protein Discussed albumin is strongly affected by stress response and inflammatory process. With a half life of 21 days, chronic illness, LE edema educated patient to not expect rapid improvements to these numbers Patient has contact information    MONITORING, EVALUATION, GOAL: weight trends, intake   NEXT VISIT: Wednesday September 14 in in clinic    

## 2021-04-09 NOTE — Progress Notes (Signed)
.  Radiation Oncology         [phone]) [phone] ________________________________  Name: Marguis Mathieson MRN: [id number]  Date: 04/09/2021  DOB: 02-03-1951  Post Treatment Note-   CC: Laurey Morale, MD  Curt Bears, MD  Diagnosis:   70 yo male with brain metastases from Stage IV (T2b, N2, M1c) adenocarcinoma of the right middle lobe of the lung.    Interval Since Last Radiation:  3 months  12/18/20 - 12/31/20:  The whole brain was treated to 30 Gy in 10 fractions of 3 Gy (HCA)  07/03/20: SRS//PTV3: 71m right temporal gyrus target was treated to a prescription dose of 20 Gy in a single fraction.   SRS//PTV4: 268mleft periventricular target was treated to a prescription dose of 20 Gy in a single fraction.     01/23/2020: SRS//PTV2: 31m74might thalamocapsular target was treated to a prescription dose of 20 Gy in a single fraction.    04/07/19: SRS// PTV1:  6 mm  right parietal target was treated to a prescription dose of 20 Gy in a single fraction.    Narrative:  I attempted to call the patient to conduct his routine scheduled 1 month follow up visit via telephone to spare the patient unnecessary potential exposure in the healthcare setting during the current COVID-19 pandemic.  The patient was notified in advance and gave permission to proceed with this visit format.  Unfortunately, there was no answer at the time of my call so I left a detailed message on his voicemail regarding the results of his recent posttreatment MRI which shows stable, treated disease and advised him that he is welcome to call back with any questions or concerns related to his previous radiotherapy.  He was initially on systemic chemotherapy with carboplatin, Alimta and Keytruda every 3 weeks under the care and direction of Dr. MohEarlie Serverd completed 4 cycles.  His restaging imaging showed a good response to treatment with a decrease in the right pleural effusion, decreased mediastinal and right hilar lymphadenopathy,  and improvement in the liver lesion and right middle lobe pulmonary lesion so he was transitioned to maintenance therapy with Alimta and Keytruda, beginning with cycle 5 of his treatment on 06/27/2019. The [place] discontinued after cycle 18 due to recurrent immunotherapy mediated pneumonitis.  Disease restaging imaging with CT C/A/P from 02/09/20 showed continued disease stability so he proceeded with cycle #16 of maintenance treatment on 02/13/20.  He was on single agent Alimta beginning with cycle 19 of maintenance therapy but unfortunately, restaging scans in 05/2020 showed disease progression in the chest, abdomen and bones, confirmed on PET scan 06/18/20 as well as 2 new lesions in the brain, noted on follow up MRI brain scan from 06/20/20. The Alimta maintenance therapy was discontinued in 06/2020 and he was changed to Monocertinib (Axkibity) 160086 p.o. daily which he tolerated well.  He completed SRS to the 2 new brain lesions on 07/03/20 and tolerated very well.  His posttreatment MRI brain scan from 10/04/20 showed stable-improved disease in the previously treated brain lesions but there was a new 2 mm focus of enhancement in the medial left frontal lobe, indeterminate for a metastasis versus vascular enhancement.  Unfortunately, the short interval MRI brain scan performed on 12/05/2020 showed interval progression of metastatic disease in the brain with numerous new, small enhancing lesions present in the cerebellum bilaterally as well as multiple new lesions present in both cerebral hemispheres and increased size of the left medial frontal lobe  lesion that we were monitoring, measuring 5 mm, compatible with metastatic disease.  This was reviewed in our recent multidisciplinary brain clinic and the recommendation iwa to proceed with whole brain radiotherapy which was completed on 12/31/20 and overall, tolerated well. He was also started on Namenda in early May 2022 which he has continued taking to help  reduce/prevent cognitive compromise associated with whole brain radiotherapy. He has continued taking this as prescribed and is tolerating this well. His systemic therapy had recently been changed to oral Tagrisso which he started taking on 01/13/21 but this was discontinued in July 2022 due to evidence of disease progression on restaging CT C/A/P from 02/27/2021.  His treatment was switched to [place] which he started on 03/12/2021 and he has now completed 5 of the planned 6 cycles, prior to repeat systemic imaging which is expected around May 05, 2021.  On 03/06/2021, he met with Dr. Tammi Klippel to discuss the potential role for palliative radiotherapy to the right hip for a painful metastatic lesion that was seen on his recent imaging but at that time, he reported that the pain was very mild and therefore, did not feel that it warranted palliative radiotherapy.            On review of systems, the patient states that he is doing well overall.  He has some residual fatigue resulting in more catnapping/power napping as well as noticeable difficulty with focus and/or memory capacity.  He specifically denies headaches, changes in auditory or visual acuity, nausea, vomiting, dizziness, imbalance, tremors or seizure activity.  He has not had recent fevers, chills or night sweats.  He is tolerating his new targeted therapy with Amivantamab fairly well.   ALLERGIES:  has No Known Allergies.  Meds: Current Outpatient Medications  Medication Sig Dispense Refill   amivantamab-vmjw 1,050 mg in sodium chloride 0.9 % 250 mL Inject 1,050 mg into the vein once.     apixaban (ELIQUIS) 5 MG TABS tablet Take 2 tablets (70m) twice daily for 7 days, then 1 tablet (537m twice daily 60 tablet 0   cycloSPORINE (RESTASIS) 0.05 % ophthalmic emulsion Place 1 drop into both eyes 2 (two) times daily.     hydrochlorothiazide (MICROZIDE) 12.5 MG capsule TAKE 1 CAPSULE BY MOUTH EVERY DAY (Patient taking differently: Take 12.5 mg  by mouth every other day.) 90 capsule 3   HYDROcodone bit-homatropine (HYCODAN) 5-1.5 MG/5ML syrup Take 5 mLs by mouth every 6 (six) hours as needed for cough. 473 mL 0   hydrocortisone 1 % lotion Apply 1 application topically 2 (two) times daily. 113 g 0   lidocaine-prilocaine (EMLA) cream Apply to the Port-A-Cath site 30-60 minutes before treatment 30 g 0   memantine (NAMENDA) 10 MG tablet TAKE 1 TABLET BY MOUTH TWICE A DAY (Patient taking differently: Take 10 mg by mouth 2 (two) times daily.) 180 tablet 2   mirtazapine (REMERON) 15 MG tablet Take 1 tablet (15 mg total) by mouth at bedtime. 30 tablet 2   tamsulosin (FLOMAX) 0.4 MG CAPS capsule Take 0.4 mg by mouth daily.     doxycycline (VIBRAMYCIN) 100 MG capsule Take 1 capsule (100 mg total) by mouth 2 (two) times daily. (Patient not taking: Reported on 04/04/2021) 14 capsule 0   LORazepam (ATIVAN) 1 MG tablet Take 1 tablet (1 mg total) by mouth as needed for anxiety (30 minutes prior to radiation treatment and may repeat just prior to procedure if needed.). (Patient not taking: Reported on 04/04/2021) 10 tablet 0  magic mouthwash SOLN Take 5 mLs by mouth 4 (four) times daily as needed for mouth pain. Swish and spit or swallow (Patient not taking: Reported on 04/04/2021) 240 mL 0   omeprazole (PRILOSEC) 20 MG capsule Take 20 mg by mouth daily. (Patient not taking: Reported on 04/04/2021)     prochlorperazine (COMPAZINE) 10 MG tablet Take 1 tablet (10 mg total) by mouth every 6 (six) hours as needed for nausea or vomiting. (Patient not taking: No sig reported) 30 tablet 1   sildenafil (REVATIO) 20 MG tablet TAKE AS DIRECTED (Patient not taking: Reported on 04/04/2021) 10 tablet 11   No current facility-administered medications for this encounter.    Physical Findings: Unable to assess due to telephone follow-up visit format.  Lab Findings: Lab Results  Component Value Date   WBC 5.3 04/02/2021   HGB 13.6 04/02/2021   HCT 41.4 04/02/2021    MCV 95.6 04/02/2021   PLT 215 04/02/2021     Radiographic Findings: DG Chest 1 View  Result Date: 03/17/2021 CLINICAL DATA:  70 year old male with history of lung malignancy, status post thoracentesis. EXAM: CHEST  1 VIEW COMPARISON:  Chest radiograph, 03/14/2021.  CT chest, 01/02/2021. FINDINGS: Cardiac silhouette is within normal limits. The left lung is well inflated. Consolidation with silhouetting of the right hemidiaphragm, unchanged. Moderate volume right, and trace left pleural effusion. No pneumothorax. Right chest port. No acute osseous abnormality. IMPRESSION: 1. No pneumothorax. 2. Moderate volume right pleural effusion with associated right basilar consolidation, unchanged. Electronically Signed   By: Michaelle Birks MD   On: 03/17/2021 13:22   CT Head Wo Contrast  Result Date: 03/14/2021 CLINICAL DATA:  Delirium; history of metastatic lung cancer. EXAM: CT HEAD WITHOUT CONTRAST TECHNIQUE: Contiguous axial images were obtained from the base of the skull through the vertex without intravenous contrast. COMPARISON:  December 05, 2020 FINDINGS: Brain: No evidence of acute infarction, hemorrhage or hydrocephalus. Known multifocal intracranial metastatic disease is not well visualized with current modality. Vascular: No hyperdense vessel or unexpected calcification. Skull: No acute fracture. Sinuses/Orbits: No acute finding. Other: None. IMPRESSION: No acute intracranial abnormality. Known intracranial metastatic disease is not well visualized on CT. Recommend further evaluation with dedicated MRI for improved comparison. Electronically Signed   By: Valentino Saxon MD   On: 03/14/2021 16:13   MR Brain W Wo Contrast  Result Date: 04/04/2021 CLINICAL DATA:  Metastatic lung cancer. Assess response to treatment. EXAM: MRI HEAD WITHOUT AND WITH CONTRAST TECHNIQUE: Multiplanar, multiecho pulse sequences of the brain and surrounding structures were obtained without and with intravenous contrast.  CONTRAST:  11m MULTIHANCE GADOBENATE DIMEGLUMINE 529 MG/ML IV SOLN COMPARISON:  MRI head 12/05/2020 FINDINGS: Brain: Numerous enhancing metastatic deposits in the brain are marked with arrows on postcontrast axial imaging. Multiple lesions in the cerebellum bilaterally are similar in size to the prior study. No new lesions. Several images appear mildly larger and are marked with double arrow. This includes a 3 mm lesion left lateral temporal lobe, a 2 mm lesion in the left midbrain, a 3 mm lesion in the right lower pons, a 3 mm lesion in the right temporal lobe, and small lesions in the left frontal lobe. Multiple additional enhancing lesions in both cerebral hemispheres are similar in size. Right frontal lesion axial image 123 shows mild hemorrhage which is not seen previously. Mild hemorrhage in lesions in the right temporal lobe and left parietal lobe similar to the prior study. Vascular: Normal arterial flow voids Skull and upper cervical  spine: No focal skeletal lesion identified. Sinuses/Orbits: Mild mucosal edema paranasal sinuses. Bilateral cataract extraction Other: None IMPRESSION: Extensive metastatic disease to the brain. Multiple lesions are stable. Several lesions show mild progression from the prior study. No new lesions. Electronically Signed   By: Franchot Gallo M.D.   On: 04/04/2021 11:42   DG Chest Port 1 View  Result Date: 03/14/2021 CLINICAL DATA:  Altered mental status. suppose to have a thoracentesis today, but missed the appointment. Patient's wife reports that when she got home with the patient today, he would only stare at her. EXAM: PORTABLE CHEST 1 VIEW.  Patient rotated. COMPARISON:  CT 01/02/2021.  Chest x-ray 04/09/2020 FINDINGS: Accessed right chest wall Port-A-Cath with tip overlying the expected region of the distal superior vena cava. Prominent cardiac silhouette likely due to AP technique and rotation. Otherwise heart size and mediastinal contours are within normal limits.  Fullness of the right hilum. Right lower mid lung zone hazy airspace opacity. No pulmonary edema. Persistent small to moderate volume right pleural effusion. No definite left pleural effusion. No pneumothorax. No acute osseous abnormality. IMPRESSION: 1. Persistent small to moderate volume right pleural effusion. Associated right lower mid lung zone hazy airspace opacity. 2. Prominent cardiac silhouette likely due to AP technique and rotation. 3. Fullness of the right hilum. May be due to rotation versus underlying adenopathy/lesion. Electronically Signed   By: Iven Finn M.D.   On: 03/14/2021 16:47   VAS [place] LOWER EXTREMITY VENOUS (DVT)  Result Date: 03/28/2021  Lower Venous DVT Study Patient Name:  DEMARIS LEAVELL [facility]  Date of Exam:   03/28/2021 Medical Rec #: [id number]              Accession #:    [id number] Date of Birth: Nov 25, 1950               Patient Gender: M Patient Age:   49 years Exam Location:  [facility] Procedure:      VAS [place] LOWER EXTREMITY VENOUS (DVT) Referring Phys: Roosevelt Locks --------------------------------------------------------------------------------  Indications: Swelling, and Pain.  Risk Factors: Chemotherapy Cancer (Lung) with mets. Comparison Study: No previous exams Performing Technologist: Jody Hill RVT, RDMS  Examination Guidelines: A complete evaluation includes B-mode imaging, spectral Doppler, color Doppler, and power Doppler as needed of all accessible portions of each vessel. Bilateral testing is considered an integral part of a complete examination. Limited examinations for reoccurring indications may be performed as noted. The reflux portion of the exam is performed with the patient in reverse Trendelenburg.  +-----+---------------+---------+-----------+----------+--------------+ RIGHTCompressibilityPhasicitySpontaneityPropertiesThrombus Aging +-----+---------------+---------+-----------+----------+--------------+ CFV  Full           Yes       Yes                                 +-----+---------------+---------+-----------+----------+--------------+   +---------+---------------+---------+-----------+----------+--------------+ LEFT     CompressibilityPhasicitySpontaneityPropertiesThrombus Aging +---------+---------------+---------+-----------+----------+--------------+ CFV      None           No       No                   Acute          +---------+---------------+---------+-----------+----------+--------------+ SFJ      None                                                        +---------+---------------+---------+-----------+----------+--------------+  FV Prox  None           No       No                   Acute          +---------+---------------+---------+-----------+----------+--------------+ FV Mid   None           No       No                   Acute          +---------+---------------+---------+-----------+----------+--------------+ FV DistalNone           No       No                   Acute          +---------+---------------+---------+-----------+----------+--------------+ PFV      None           No       No                   Acute          +---------+---------------+---------+-----------+----------+--------------+ POP      None           No       No                   Acute          +---------+---------------+---------+-----------+----------+--------------+ PTV      None           No       No                   Acute          +---------+---------------+---------+-----------+----------+--------------+ PERO     None           No       No                   Acute          +---------+---------------+---------+-----------+----------+--------------+ Gastroc  None           No       No                   Acute          +---------+---------------+---------+-----------+----------+--------------+ SSV      None           No       No                   Acute           +---------+---------------+---------+-----------+----------+--------------+ EIV      Full           Yes      Yes                                 +---------+---------------+---------+-----------+----------+--------------+     Summary: RIGHT: - No evidence of common femoral vein obstruction.  LEFT: - Findings consistent with acute deep vein thrombosis involving the left common femoral vein, SF junction, left femoral vein, left proximal profunda vein, left popliteal vein, left posterior tibial veins, left peroneal veins, and left gastrocnemius veins. - Findings consistent with acute superficial vein thrombosis involving the left small saphenous vein. - No cystic structure  found in the popliteal fossa.  *See table(s) above for measurements and observations. Electronically signed by Jamelle Haring on 03/28/2021 at 5:34:31 PM.    Final    US Thoracentesis Asp Pleural space w/IMG guide  Result Date: 03/17/2021 INDICATION: Patient with history of stage IV right lung cancer with recurrent malignant right pleural effusion; request received for therapeutic right thoracentesis. EXAM: ULTRASOUND GUIDED THERAPEUTIC RIGHT THORACENTESIS MEDICATIONS: 1% lidocaine to skin and subcutaneous tissue COMPLICATIONS: None immediate. PROCEDURE: An ultrasound guided thoracentesis was thoroughly discussed with the patient and questions answered. The benefits, risks, alternatives and complications were also discussed. The patient understands and wishes to proceed with the procedure. Written consent was obtained. Ultrasound was performed to localize and mark an adequate pocket of fluid in the right chest. The area was then prepped and draped in the normal sterile fashion. 1% Lidocaine was used for local anesthesia. Under ultrasound guidance a 6 Fr Safe-T-Centesis catheter was introduced. Thoracentesis was performed. The catheter was removed and a dressing applied. FINDINGS: A total of approximately 350 cc of dark brown fluid was removed.  Due to patient chest discomfort only the above amount of fluid was removed today. IMPRESSION: Successful ultrasound guided therapeutic right thoracentesis yielding 350 cc of pleural fluid. Read by: Rowe Robert, PA-C Electronically Signed   By: Michaelle Birks MD   On: 03/17/2021 13:04     Impression/Plan: 53. 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    He appears to be recovering well from the effects of his recent WBRT with HCA treatment and remains without complaints aside from fatigue and decreased focus/memory capacity.  We discussed the plan for a repeat MRI brain scan in I left him a detailed message on his voicemail today regarding the stable appearance of his treated brain disease on recent MRI brain as well as the plan to resume serial brain MRI scans every 3 months to monitor for any disease progression or recurrence with a telephone follow-up after each scan.  He will also continue in routine follow-up under the care and direction of Dr. Earlie Server for continued management of his systemic disease.  He anticipates repeat systemic imaging around 05/05/2021 to assess his response to the new targeted therapy.  He is planning to complete the recommended 74-monthcourse of Namenda as prescribed and is tolerating this well.  He knows to call at anytime in the interim with any questions or concerns.      ANicholos Johns PA-C

## 2021-04-09 NOTE — Patient Instructions (Signed)
[facility]  Discharge Instructions: Thank you for choosing [facility] to provide your oncology and hematology care.   If you have a lab appointment with the [facility], please go directly to the [facility] and check in at the registration area.   Wear comfortable clothing and clothing appropriate for easy access to any Portacath or PICC line.   We strive to give you quality time with your provider. You may need to reschedule your appointment if you arrive late (15 or more minutes).  Arriving late affects you and other patients whose appointments are after yours.  Also, if you miss three or more appointments without notifying the office, you may be dismissed from the clinic at the provider's discretion.      For prescription refill requests, have your pharmacy contact our office and allow 72 hours for refills to be completed.    Today you received the following chemotherapy and/or immunotherapy agents: [place]brevant    To help prevent nausea and vomiting after your treatment, we encourage you to take your nausea medication as directed.  BELOW ARE SYMPTOMS THAT SHOULD BE REPORTED IMMEDIATELY: *FEVER GREATER THAN 100.4 F (38 C) OR HIGHER *CHILLS OR SWEATING *NAUSEA AND VOMITING THAT IS NOT CONTROLLED WITH YOUR NAUSEA MEDICATION *UNUSUAL SHORTNESS OF BREATH *UNUSUAL BRUISING OR BLEEDING *URINARY PROBLEMS (pain or burning when urinating, or frequent urination) *BOWEL PROBLEMS (unusual diarrhea, constipation, pain near the anus) TENDERNESS IN MOUTH AND THROAT WITH OR WITHOUT PRESENCE OF ULCERS (sore throat, sores in mouth, or a toothache) UNUSUAL RASH, SWELLING OR PAIN  UNUSUAL VAGINAL DISCHARGE OR ITCHING   Items with * indicate a potential emergency and should be followed up as soon as possible or go to the Emergency Department if any problems should occur.  Please show the CHEMOTHERAPY ALERT CARD or IMMUNOTHERAPY ALERT CARD at check-in to  the Emergency Department and triage nurse.  Should you have questions after your visit or need to cancel or reschedule your appointment, please contact [facility]  Dept: [phone]  and follow the prompts.  Office hours are 8:00 a.m. to 4:30 p.m. Monday - Friday. Please note that voicemails left after 4:00 p.m. may not be returned until the following business day.  We are closed weekends and major holidays. You have access to a nurse at all times for urgent questions. Please call the main number to the clinic Dept: [phone] and follow the prompts.   For any non-urgent questions, you may also contact your provider using MyChart. We now offer e-Visits for anyone 21 and older to request care online for non-urgent symptoms. For details visit [email or web address].   Also download the MyChart app! Go to the app store, search "MyChart", open the app, select [facility], and log in with your MyChart username and password.  Due to Covid, a mask is required upon entering the hospital/clinic. If you do not have a mask, one will be given to you upon arrival. For doctor visits, patients may have 1 support person aged 78 or older with them. For treatment visits, patients cannot have anyone with them due to current Covid guidelines and our immunocompromised population.

## 2021-04-13 ENCOUNTER — Encounter: Payer: Self-pay | Admitting: Internal Medicine

## 2021-04-15 ENCOUNTER — Encounter: Payer: Self-pay | Admitting: Internal Medicine

## 2021-04-16 ENCOUNTER — Inpatient Hospital Stay: Payer: Medicare Other | Admitting: Internal Medicine

## 2021-04-16 ENCOUNTER — Inpatient Hospital Stay: Payer: Medicare Other

## 2021-04-16 ENCOUNTER — Other Ambulatory Visit: Payer: Medicare Other

## 2021-04-16 ENCOUNTER — Other Ambulatory Visit: Payer: Self-pay | Admitting: Physician Assistant

## 2021-04-16 DIAGNOSIS — I824Z2 Acute embolism and thrombosis of unspecified deep veins of left distal lower extremity: Secondary | ICD-10-CM

## 2021-04-16 MED ORDER — APIXABAN 5 MG PO TABS: 5.0000 mg | ORAL_TABLET | Freq: Two times a day (BID) | ORAL | 3 refills | Status: DC

## 2021-04-22 MED FILL — Dexamethasone Sodium Phosphate Inj 100 MG/10ML: INTRAMUSCULAR | Qty: 1 | Status: AC

## 2021-04-23 ENCOUNTER — Inpatient Hospital Stay: Payer: Medicare Other

## 2021-04-23 ENCOUNTER — Other Ambulatory Visit: Payer: Self-pay

## 2021-04-23 ENCOUNTER — Inpatient Hospital Stay (HOSPITAL_BASED_OUTPATIENT_CLINIC_OR_DEPARTMENT_OTHER): Payer: Medicare Other | Admitting: Internal Medicine

## 2021-04-23 VITALS — BP 126/80 | HR 97 | Temp 98.4°F | Resp 18 | Ht 71.0 in | Wt 193.5 lb

## 2021-04-23 DIAGNOSIS — C3491 Malignant neoplasm of unspecified part of right bronchus or lung: Secondary | ICD-10-CM

## 2021-04-23 DIAGNOSIS — I824Z2 Acute embolism and thrombosis of unspecified deep veins of left distal lower extremity: Secondary | ICD-10-CM

## 2021-04-23 DIAGNOSIS — Z5111 Encounter for antineoplastic chemotherapy: Secondary | ICD-10-CM

## 2021-04-23 DIAGNOSIS — C787 Secondary malignant neoplasm of liver and intrahepatic bile duct: Secondary | ICD-10-CM | POA: Diagnosis not present

## 2021-04-23 DIAGNOSIS — C3490 Malignant neoplasm of unspecified part of unspecified bronchus or lung: Secondary | ICD-10-CM | POA: Diagnosis not present

## 2021-04-23 DIAGNOSIS — C7951 Secondary malignant neoplasm of bone: Secondary | ICD-10-CM | POA: Diagnosis not present

## 2021-04-23 DIAGNOSIS — C7931 Secondary malignant neoplasm of brain: Secondary | ICD-10-CM

## 2021-04-23 DIAGNOSIS — Z95828 Presence of other vascular implants and grafts: Secondary | ICD-10-CM

## 2021-04-23 DIAGNOSIS — J910 Malignant pleural effusion: Secondary | ICD-10-CM | POA: Diagnosis not present

## 2021-04-23 DIAGNOSIS — R600 Localized edema: Secondary | ICD-10-CM

## 2021-04-23 DIAGNOSIS — C342 Malignant neoplasm of middle lobe, bronchus or lung: Secondary | ICD-10-CM | POA: Diagnosis not present

## 2021-04-23 LAB — CMP (CANCER CENTER ONLY)
ALT: 28 U/L (ref 0–44)
AST: 21 U/L (ref 15–41)
Albumin: 2 g/dL — ABNORMAL LOW (ref 3.5–5.0)
Alkaline Phosphatase: 176 U/L — ABNORMAL HIGH (ref 38–126)
Anion gap: 8 (ref 5–15)
BUN: 16 mg/dL (ref 8–23)
CO2: 27 mmol/L (ref 22–32)
Calcium: 7.8 mg/dL — ABNORMAL LOW (ref 8.9–10.3)
Chloride: 105 mmol/L (ref 98–111)
Creatinine: 0.85 mg/dL (ref 0.61–1.24)
GFR, Estimated: >60 mL/min (ref >60)
Glucose, Bld: 108 mg/dL — ABNORMAL HIGH (ref 70–99)
Potassium: 3.5 mmol/L (ref 3.5–5.1)
Sodium: 140 mmol/L (ref 135–145)
Total Bilirubin: 0.3 mg/dL (ref 0.3–1.2)
Total Protein: 4.8 g/dL — ABNORMAL LOW (ref 6.5–8.1)

## 2021-04-23 LAB — CBC WITH DIFFERENTIAL (CANCER CENTER ONLY)
Abs Immature Granulocytes: 0.01 10*3/uL (ref 0.00–0.07)
Basophils Absolute: 0 10*3/uL (ref 0.0–0.1)
Basophils Relative: 0 %
Eosinophils Absolute: 0.1 10*3/uL (ref 0.0–0.5)
Eosinophils Relative: 2 %
HCT: 39.5 % (ref 39.0–52.0)
Hemoglobin: 13.2 g/dL (ref 13.0–17.0)
Immature Granulocytes: 0 %
Lymphocytes Relative: 18 %
Lymphs Abs: 0.8 10*3/uL (ref 0.7–4.0)
MCH: 31.4 pg (ref 26.0–34.0)
MCHC: 33.4 g/dL (ref 30.0–36.0)
MCV: 94 fL (ref 80.0–100.0)
Monocytes Absolute: 0.4 10*3/uL (ref 0.1–1.0)
Monocytes Relative: 8 %
Neutro Abs: 3.3 10*3/uL (ref 1.7–7.7)
Neutrophils Relative %: 72 %
Platelet Count: 189 10*3/uL (ref 150–400)
RBC: 4.2 MIL/uL — ABNORMAL LOW (ref 4.22–5.81)
RDW: 14.4 % (ref 11.5–15.5)
WBC Count: 4.7 10*3/uL (ref 4.0–10.5)
nRBC: 0 % (ref 0.0–0.2)

## 2021-04-23 LAB — MAGNESIUM: Magnesium: 1.9 mg/dL (ref 1.7–2.4)

## 2021-04-23 IMAGING — US IR THORACENTESIS ASP PLEURAL SPACE W/IMG GUIDE
1 series · 1 of 1 positions shown · non-contrast
Comparison: none

INDICATION: Patient with right pleural effusion. Request is made for right
diagnostic and therapeutic thoracentesis.

[Series 1: ir (id) (id)/(id)/(id) ir · 1 of 1 slices shown]
[im 1/1]
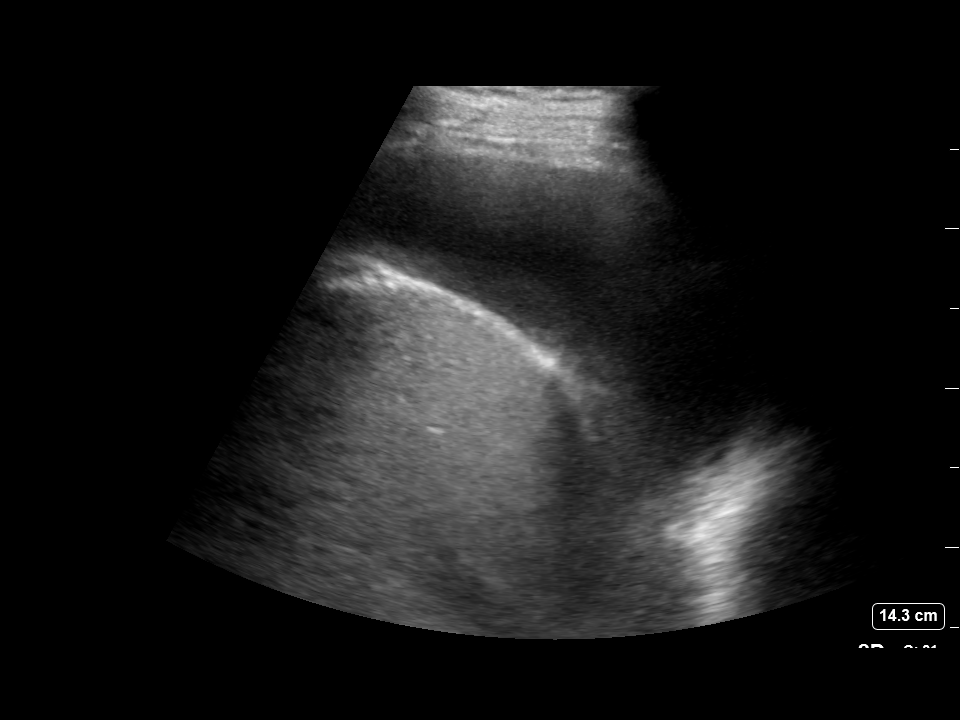

[1 of 1 positions shown; findings below may reference images not displayed]

EXAM:
ULTRASOUND GUIDED DIAGNOSTIC AND THERAPEUTIC RIGHT THORACENTESIS

MEDICATIONS:
10 mL 1% lidocaine

COMPLICATIONS:
None immediate.

PROCEDURE:
An ultrasound guided thoracentesis was thoroughly discussed with the
patient and questions answered. The benefits, risks, alternatives
and complications were also discussed. The patient understands and
wishes to proceed with the procedure. Written consent was obtained.

Ultrasound was performed to localize and mark an adequate pocket of
fluid in the right chest. The area was then prepped and draped in
the normal sterile fashion. 1% Lidocaine was used for local
anesthesia. Under ultrasound guidance a 6 Fr Safe-T-Centesis
catheter was introduced. Thoracentesis was performed. The catheter
was removed and a dressing applied.
FINDINGS: A total of approximately 60 mL of amber fluid was removed. Samples
were sent to the laboratory as requested by the clinical team.
IMPRESSION: Successful ultrasound guided diagnostic and therapeutic right
thoracentesis yielding 60 mL of pleural fluid.

No pneumothorax on follow-up radiograph.

## 2021-04-23 MED ORDER — ONDANSETRON HCL 4 MG/2ML IJ SOLN
8.0000 mg | Freq: Once | INTRAMUSCULAR | Status: AC
  Administered 2021-04-23: 8 mg via INTRAVENOUS
  Filled 2021-04-23: qty 4

## 2021-04-23 MED ORDER — SODIUM CHLORIDE 0.9% FLUSH
10.0000 mL | INTRAVENOUS | Status: DC | PRN
  Administered 2021-04-23: 10 mL

## 2021-04-23 MED ORDER — SODIUM CHLORIDE 0.9 % IV SOLN
1400.0000 mg | Freq: Once | INTRAVENOUS | Status: AC
  Administered 2021-04-23: 1400 mg via INTRAVENOUS
  Filled 2021-04-23: qty 28

## 2021-04-23 MED ORDER — SODIUM CHLORIDE 0.9% FLUSH
10.0000 mL | INTRAVENOUS | Status: DC | PRN
Start: 2021-04-23 — End: 2021-04-23
  Administered 2021-04-23: 10 mL

## 2021-04-23 MED ORDER — FAMOTIDINE 20 MG IN NS 100 ML IVPB
20.0000 mg | Freq: Once | INTRAVENOUS | Status: AC
  Administered 2021-04-23: 20 mg via INTRAVENOUS
  Filled 2021-04-23: qty 100

## 2021-04-23 MED ORDER — SODIUM CHLORIDE 0.9 % IV SOLN: Freq: Once | INTRAVENOUS | Status: AC

## 2021-04-23 MED ORDER — DIPHENHYDRAMINE HCL 25 MG PO CAPS
50.0000 mg | ORAL_CAPSULE | Freq: Once | ORAL | Status: AC
  Administered 2021-04-23: 50 mg via ORAL
  Filled 2021-04-23: qty 2

## 2021-04-23 MED ORDER — SODIUM CHLORIDE 0.9 % IV SOLN
10.0000 mg | Freq: Once | INTRAVENOUS | Status: AC
  Administered 2021-04-23: 10 mg via INTRAVENOUS
  Filled 2021-04-23: qty 10

## 2021-04-23 MED ORDER — HEPARIN SOD (PORK) LOCK FLUSH 100 UNIT/ML IV SOLN
500.0000 [IU] | Freq: Once | INTRAVENOUS | Status: AC | PRN
  Administered 2021-04-23: 500 [IU]

## 2021-04-23 MED ORDER — ACETAMINOPHEN 325 MG PO TABS
650.0000 mg | ORAL_TABLET | Freq: Once | ORAL | Status: AC
  Administered 2021-04-23: 650 mg via ORAL
  Filled 2021-04-23: qty 2

## 2021-04-23 NOTE — Progress Notes (Signed)
[facility] [phone]   [phone]  OFFICE PROGRESS NOTE  Laurey Morale, MD [place] [address]  DIAGNOSIS: Stage IV (T2b, N2, M1c) presented with right middle lobe lung mass in addition to mediastinal lymphadenopathy as well as malignant right pleural effusion with pleural-based nodules and suspicious right hepatic lesion and the brain metastasis diagnosed in July 2020.  Molecular Biomarkers: [id number] (Exon 20 insertion) 0.2% Dacomitinib,Neratinib,Osimertinib  [id number] 0.1% None   PRIOR THERAPY:  1) Systemic chemotherapy with carboplatin for AUC of 5, Alimta 500 mg/M2 and Keytruda 200 mg IV every 3 weeks.  First dose April 04, 2019.  Status post 21 cycles  Starting from cycle #5 the patient is on maintenance treatment with Alimta and Keytruda every 3 weeks.  Starting from cycle #19 he will be on single agent Alimta.  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 2) whole brain irradiation for multiple metastatic brain lesions. 3)  Mobocertinib (Axkibity) 320 mg p.o. daily.  First dose was June 29, 2020.  Status post 6 months of treatment.  This was discontinued secondary to disease progression in the brain as well as the chest. 4) treatment was targeted therapy with Tagrisso 80 mg p.o. daily status post 7 weeks of treatment discontinued secondary to disease progression.  CURRENT THERAPY: Amivantamab 350 Mg IV on day 1 followed by 1050 Mg IV on day 2 of cycle #1 started on March 12, 2021 status post 1 cycle.  Starting from cycle #2 his dose will be 1400 Mg IV weekly for 3 cycles followed by 1400 Mg IV every 2 weeks starting from cycle #5.  Status post 5 cycles.  INTERVAL HISTORY: Ethan Brown 70 y.o. male returns to the clinic today for follow-up visit accompanied by his son.  The patient continues to complain of increasing fatigue and weakness as well as shortness  of breath at baseline increased with exertion especially at nighttime.  He also has cough that has been getting worse at nighttime recently.  He continues to have swelling of the left lower extremity after his diagnosis with deep venous thrombosis.  He is currently on Eliquis and did not notice much improvement in the swelling so far.  He is also on Lasix 20 mg p.o. daily.  He denied having any chest pain or hemoptysis.  He denied having any nausea, vomiting, diarrhea or constipation.  He denied having any headache or visual changes.  His last MRI of the brain showed several lesions with mild progression.  The patient continues to tolerate his treatment with Amivantamab fairly well.  He is here to start cycle #6.  MEDICAL HISTORY: Past Medical History:  Diagnosis Date   Borderline hypertension    Brain metastasis ([place]) dx'd 01/2019   Detached vitreous humor, left    Dyspnea    ED (erectile dysfunction) of organic origin    Hyperlipidemia    Hypertension    Lens subluxation, left    Lung cancer ([place]) dx'd 01/2019   Stage IV   Migraine headache    takes PRN Imitrex   Pseudophakia    TGA (transient global amnesia) 2017    ALLERGIES:  has No Known Allergies.  MEDICATIONS:  Current Outpatient Medications  Medication Sig Dispense Refill   amivantamab-vmjw 1,050 mg in sodium chloride 0.9 % 250 mL Inject 1,050 mg into the vein once.     apixaban (ELIQUIS) 5 MG TABS tablet Take 1 tablet (5 mg  total) by mouth 2 (two) times daily. 60 tablet 3   cycloSPORINE (RESTASIS) 0.05 % ophthalmic emulsion Place 1 drop into both eyes 2 (two) times daily.     doxycycline (VIBRAMYCIN) 100 MG capsule Take 1 capsule (100 mg total) by mouth 2 (two) times daily. (Patient not taking: Reported on 04/04/2021) 14 capsule 0   furosemide (LASIX) 20 MG tablet 1 tablet p.o. daily on as-needed basis for swelling. 10 tablet 0   hydrochlorothiazide (MICROZIDE) 12.5 MG capsule TAKE 1 CAPSULE BY MOUTH EVERY DAY (Patient taking  differently: Take 12.5 mg by mouth every other day.) 90 capsule 3   HYDROcodone bit-homatropine (HYCODAN) 5-1.5 MG/5ML syrup Take 5 mLs by mouth every 6 (six) hours as needed for cough. 473 mL 0   hydrocortisone 1 % lotion Apply 1 application topically 2 (two) times daily. 113 g 0   lidocaine-prilocaine (EMLA) cream Apply to the Port-A-Cath site 30-60 minutes before treatment 30 g 0   LORazepam (ATIVAN) 1 MG tablet Take 1 tablet (1 mg total) by mouth as needed for anxiety (30 minutes prior to radiation treatment and may repeat just prior to procedure if needed.). (Patient not taking: Reported on 04/04/2021) 10 tablet 0   magic mouthwash SOLN Take 5 mLs by mouth 4 (four) times daily as needed for mouth pain. Swish and spit or swallow (Patient not taking: Reported on 04/04/2021) 240 mL 0   memantine (NAMENDA) 10 MG tablet TAKE 1 TABLET BY MOUTH TWICE A DAY (Patient taking differently: Take 10 mg by mouth 2 (two) times daily.) 180 tablet 2   mirtazapine (REMERON) 15 MG tablet Take 1 tablet (15 mg total) by mouth at bedtime. 30 tablet 2   omeprazole (PRILOSEC) 20 MG capsule Take 20 mg by mouth daily. (Patient not taking: Reported on 04/04/2021)     prochlorperazine (COMPAZINE) 10 MG tablet Take 1 tablet (10 mg total) by mouth every 6 (six) hours as needed for nausea or vomiting. (Patient not taking: No sig reported) 30 tablet 1   sildenafil (REVATIO) 20 MG tablet TAKE AS DIRECTED (Patient not taking: Reported on 04/04/2021) 10 tablet 11   tamsulosin (FLOMAX) 0.4 MG CAPS capsule Take 0.4 mg by mouth daily.     No current facility-administered medications for this visit.   Facility-Administered Medications Ordered in Other Visits  Medication Dose Route Frequency Provider Last Rate Last Admin   sodium chloride flush (NS) 0.9 % injection 10 mL  10 mL Intracatheter PRN Curt Bears, MD   10 mL at 04/23/21 0751    SURGICAL HISTORY:  Past Surgical History:  Procedure Laterality Date   CAROTID DOPPLERS   09/2017   Mild non-obstructive disease   CATARACT EXTRACTION, BILATERAL     cataract, left  2018   CHEST TUBE INSERTION Right 03/29/2019   Procedure: INSERTION PLEURAL DRAINAGE CATHETER;  Surgeon: [name], MD;  Location: [facility];  Service: Thoracic;  Laterality: Right;   COLONOSCOPY  2016   clear, repeat in 10 yrs    Eye surgeries     , Lens attachment.  Vitrectomy   FEMORAL HERNIA REPAIR     HERNIA REPAIR     IR THORACENTESIS ASP PLEURAL SPACE W/IMG GUIDE  02/22/2019   IR THORACENTESIS ASP PLEURAL SPACE W/IMG GUIDE  03/13/2019   IR THORACENTESIS ASP PLEURAL SPACE W/IMG GUIDE  03/18/2020   LUMBAR LAMINECTOMY     PLEURAL BIOPSY Right 03/29/2019   Procedure: PLEURAL BIOPSY;  Surgeon: [name], MD;  Location: [facility];  Service: Thoracic;  Laterality: Right;   PORTACATH PLACEMENT N/A 03/29/2019   Procedure: INSERTION PORT-A-CATH;  Surgeon: [name], MD;  Location: [facility];  Service: Thoracic;  Laterality: N/A;   retinal attachment, right     sclearl buckle, right     TRANSTHORACIC ECHOCARDIOGRAM  12/14/2019   EF 50 EF 55% (low normal).  Abnormal septal wall motion related to BBB; indeterminate diastolic parameters.  Mild RV enlargement with mildly reduced function.  Normal valves.  Normal atrial sizes.   victrectomy,right     VIDEO ASSISTED THORACOSCOPY Right 03/29/2019   Procedure: VIDEO ASSISTED THORACOSCOPY;  Surgeon: [name], MD;  Location: [facility];  Service: Thoracic;  Laterality: Right;    REVIEW OF SYSTEMS:  Constitutional: positive for fatigue Eyes: negative Ears, nose, mouth, throat, and face: negative Respiratory: positive for cough and dyspnea on exertion Cardiovascular: negative Gastrointestinal: negative Genitourinary:negative Integument/breast: negative Hematologic/lymphatic: negative Musculoskeletal:positive for arthralgias and muscle weakness Neurological: positive for weakness Behavioral/Psych: negative Endocrine:  negative Allergic/Immunologic: negative   PHYSICAL EXAMINATION: General appearance: alert, cooperative, fatigued, and no distress Head: Normocephalic, without obvious abnormality, atraumatic Neck: no adenopathy, no JVD, supple, symmetrical, trachea midline, and thyroid not enlarged, symmetric, no tenderness/mass/nodules Lymph nodes: Cervical, supraclavicular, and axillary nodes normal. Resp: clear to auscultation bilaterally Back: symmetric, no curvature. ROM normal. No CVA tenderness. Cardio: regular rate and rhythm, S1, S2 normal, no murmur, click, rub or gallop GI: soft, non-tender; bowel sounds normal; no masses,  no organomegaly Extremities: edema 1+ edema in the left lower extremity Neurologic: Alert and oriented X 3, normal strength and tone. Normal symmetric reflexes. Normal coordination and gait Skin exam: Petechial rash on the forehead and the face bilaterally.  ECOG PERFORMANCE STATUS: 1 - Symptomatic but completely ambulatory  Blood pressure 126/80, pulse 97, temperature 98.4 F (36.9 C), temperature source Tympanic, resp. rate 18, height '5\' 11"'  (1.803 m), weight 193 lb 8 oz (87.8 kg), SpO2 97 %.  LABORATORY DATA: Lab Results  Component Value Date   WBC 4.7 04/23/2021   HGB 13.2 04/23/2021   HCT 39.5 04/23/2021   MCV 94.0 04/23/2021   PLT 189 04/23/2021      Chemistry      Component Value Date/Time   NA 140 04/09/2021 1012   K 3.8 04/09/2021 1012   CL 107 04/09/2021 1012   CO2 25 04/09/2021 1012   BUN 22 04/09/2021 1012   CREATININE 0.84 04/09/2021 1012      Component Value Date/Time   CALCIUM 8.0 (L) 04/09/2021 1012   ALKPHOS 192 (H) 04/09/2021 1012   AST 18 04/09/2021 1012   ALT 22 04/09/2021 1012   BILITOT 0.3 04/09/2021 1012       RADIOGRAPHIC STUDIES: MR Brain W Wo Contrast  Result Date: 04/04/2021 CLINICAL DATA:  Metastatic lung cancer. Assess response to treatment. EXAM: MRI HEAD WITHOUT AND WITH CONTRAST TECHNIQUE: Multiplanar, multiecho pulse  sequences of the brain and surrounding structures were obtained without and with intravenous contrast. CONTRAST:  54m MULTIHANCE GADOBENATE DIMEGLUMINE 529 MG/ML IV SOLN COMPARISON:  MRI head 12/05/2020 FINDINGS: Brain: Numerous enhancing metastatic deposits in the brain are marked with arrows on postcontrast axial imaging. Multiple lesions in the cerebellum bilaterally are similar in size to the prior study. No new lesions. Several images appear mildly larger and are marked with double arrow. This includes a 3 mm lesion left lateral temporal lobe, a 2 mm lesion in the left midbrain, a 3 mm lesion in the right lower pons, a 3  mm lesion in the right temporal lobe, and small lesions in the left frontal lobe. Multiple additional enhancing lesions in both cerebral hemispheres are similar in size. Right frontal lesion axial image 123 shows mild hemorrhage which is not seen previously. Mild hemorrhage in lesions in the right temporal lobe and left parietal lobe similar to the prior study. Vascular: Normal arterial flow voids Skull and upper cervical spine: No focal skeletal lesion identified. Sinuses/Orbits: Mild mucosal edema paranasal sinuses. Bilateral cataract extraction Other: None IMPRESSION: Extensive metastatic disease to the brain. Multiple lesions are stable. Several lesions show mild progression from the prior study. No new lesions. Electronically Signed   By: Franchot Gallo M.D.   On: 04/04/2021 11:42   VAS [place] LOWER EXTREMITY VENOUS (DVT)  Result Date: 03/28/2021  Lower Venous DVT Study Patient Name:  Ethan Brown [facility]  Date of Exam:   03/28/2021 Medical Rec #: [id number]              Accession #:    [id number] Date of Birth: 01/16/1951               Patient Gender: M Patient Age:   70 years Exam Location:  [facility] Procedure:      VAS [place] LOWER EXTREMITY VENOUS (DVT) Referring Phys: Roosevelt Locks --------------------------------------------------------------------------------   Indications: Swelling, and Pain.  Risk Factors: Chemotherapy Cancer (Lung) with mets. Comparison Study: No previous exams Performing Technologist: Jody Hill RVT, RDMS  Examination Guidelines: A complete evaluation includes B-mode imaging, spectral Doppler, color Doppler, and power Doppler as needed of all accessible portions of each vessel. Bilateral testing is considered an integral part of a complete examination. Limited examinations for reoccurring indications may be performed as noted. The reflux portion of the exam is performed with the patient in reverse Trendelenburg.  +-----+---------------+---------+-----------+----------+--------------+ RIGHTCompressibilityPhasicitySpontaneityPropertiesThrombus Aging +-----+---------------+---------+-----------+----------+--------------+ CFV  Full           Yes      Yes                                 +-----+---------------+---------+-----------+----------+--------------+   +---------+---------------+---------+-----------+----------+--------------+ LEFT     CompressibilityPhasicitySpontaneityPropertiesThrombus Aging +---------+---------------+---------+-----------+----------+--------------+ CFV      None           No       No                   Acute          +---------+---------------+---------+-----------+----------+--------------+ SFJ      None                                                        +---------+---------------+---------+-----------+----------+--------------+ FV Prox  None           No       No                   Acute          +---------+---------------+---------+-----------+----------+--------------+ FV Mid   None           No       No                   Acute          +---------+---------------+---------+-----------+----------+--------------+ FV  DistalNone           No       No                   Acute          +---------+---------------+---------+-----------+----------+--------------+ PFV      None            No       No                   Acute          +---------+---------------+---------+-----------+----------+--------------+ POP      None           No       No                   Acute          +---------+---------------+---------+-----------+----------+--------------+ PTV      None           No       No                   Acute          +---------+---------------+---------+-----------+----------+--------------+ PERO     None           No       No                   Acute          +---------+---------------+---------+-----------+----------+--------------+ Gastroc  None           No       No                   Acute          +---------+---------------+---------+-----------+----------+--------------+ SSV      None           No       No                   Acute          +---------+---------------+---------+-----------+----------+--------------+ EIV      Full           Yes      Yes                                 +---------+---------------+---------+-----------+----------+--------------+     Summary: RIGHT: - No evidence of common femoral vein obstruction.  LEFT: - Findings consistent with acute deep vein thrombosis involving the left common femoral vein, SF junction, left femoral vein, left proximal profunda vein, left popliteal vein, left posterior tibial veins, left peroneal veins, and left gastrocnemius veins. - Findings consistent with acute superficial vein thrombosis involving the left small saphenous vein. - No cystic structure found in the popliteal fossa.  *See table(s) above for measurements and observations. Electronically signed by Jamelle Haring on 03/28/2021 at 5:34:31 PM.    Final      ASSESSMENT AND PLAN: This is a very pleasant 70 years old white male recently diagnosed with a stage IV (T2b, N2, M1C) non-small cell lung cancer, adenocarcinoma with positive EGFR mutation in exon 20 (resistant mutation) diagnosed in July 2020 and presented with extensive  disease involving the right upper lobe as well as the right middle and lower lobe with extensive pleural based metastasis as well as mediastinal and hilar disease  with malignant pleural effusion as well as liver and brain metastasis. Unfortunately the patient has no actionable mutations based on the molecular studies by [facility] [employer].   The patient is currently undergoing systemic chemotherapy with carboplatin for AUC of 5, Alimta 500 mg/M2 and Keytruda 200 mg IV every 3 weeks status post 21 cycles.  Starting from cycle #5 the patient is on maintenance treatment with Alimta and Keytruda. Beryle Flock was discontinued after cycle #18 secondary to recurrent immunotherapy mediated pneumonitis. Starting from cycle #19 the patient will be on treatment with single agent Alimta every 3 weeks. The patient has been tolerating the treatment well. He had repeat PET scan and MRI of the brain performed recently.  The PET scan showed evidence for disease progression with metastatic disease involving the thoracic and abdominal lymph nodes as well as the right hemithorax and bones.  He also has loculated moderate right pleural effusion and small left pleural effusion.  The PET scan also showed prostate enlargement and his PSA has been rising recently.  He was seen by urology. MRI of the brain showed development of new 5 mm right temporal lesion suspicious for metastatic disease as well as new 2 mm focus of enhancement in the left periventricular white matter also suspicious for metastatic disease.  He was seen by radiation oncology and expected to have Barton to this lesion soon. The patient started treatment with Mobocertinib (Axkibity) 557 mg p.o. daily.  Status post 6 months of treatment.  He has been tolerating the treatment well with no concerning adverse effects except for the dryness of his nose that is not responding to the over-the-counter medication.  His treatment was discontinued secondary to disease  progression. Unfortunately he had significant disease progression in the brain with innumerable brain metastasis in addition to previous increase in some of the pulmonary nodules seen on the previous imaging studies.  He completed a course of whole brain irradiation. The patient was treated with Tagrisso 80 mg p.o. daily status post 7 weeks of treatment discontinued secondary to disease progression. The patient started last week the first dose of his treatment with Amivantamab with reduced dose of 350 Mg IV on day 1 and 1050 Mg IV on day 2.  He has hypersensitivity reaction on day 1 required a lot of premedication and treatment with Solu-Medrol, Pepcid and Benadryl as well as Demerol.  He started tolerating his treatment fairly well with no concerning adverse effects starting from cycle #2. He is currently status post 5 cycles.   The patient continues to tolerate this treatment well with no concerning adverse effects. I recommended for him to proceed with cycle #6 today as planned. I will see him back for follow-up visit in 2 weeks for evaluation with repeat CT scan of the chest, abdomen pelvis for restaging of his disease. For the left lower extremity deep venous thrombosis he is currently on Eliquis 5 mg p.o. twice daily.  I also gave him prescription for Lasix 20 mg p.o. daily but will increase his dose to 40 mg p.o. daily because of the persistent swelling and chest congestion. For the lack of appetite, he is gaining more weight recently and he will continue with the oral supplements. For the cough he will continue with his current cough medication for now. The patient was advised to call immediately if he has any other concerning symptoms in the interval. The total time spent in the appointment was 40 minutes.   The patient voices understanding of current disease status  and treatment options and is in agreement with the current care plan. All questions were answered. The patient knows to call  the clinic with any problems, questions or concerns. We can certainly see the patient much sooner if necessary.  Disclaimer: This note was dictated with voice recognition software. Similar sounding words can inadvertently be transcribed and may not be corrected upon review.

## 2021-04-23 NOTE — Patient Instructions (Signed)
[facility]  Discharge Instructions: Thank you for choosing [facility] to provide your oncology and hematology care.   If you have a lab appointment with the [facility], please go directly to the [facility] and check in at the registration area.   Wear comfortable clothing and clothing appropriate for easy access to any Portacath or PICC line.   We strive to give you quality time with your provider. You may need to reschedule your appointment if you arrive late (15 or more minutes).  Arriving late affects you and other patients whose appointments are after yours.  Also, if you miss three or more appointments without notifying the office, you may be dismissed from the clinic at the provider's discretion.      For prescription refill requests, have your pharmacy contact our office and allow 72 hours for refills to be completed.    Today you received the following chemotherapy and/or immunotherapy agents [facility]      To help prevent nausea and vomiting after your treatment, we encourage you to take your nausea medication as directed.  BELOW ARE SYMPTOMS THAT SHOULD BE REPORTED IMMEDIATELY: *FEVER GREATER THAN 100.4 F (38 C) OR HIGHER *CHILLS OR SWEATING *NAUSEA AND VOMITING THAT IS NOT CONTROLLED WITH YOUR NAUSEA MEDICATION *UNUSUAL SHORTNESS OF BREATH *UNUSUAL BRUISING OR BLEEDING *URINARY PROBLEMS (pain or burning when urinating, or frequent urination) *BOWEL PROBLEMS (unusual diarrhea, constipation, pain near the anus) TENDERNESS IN MOUTH AND THROAT WITH OR WITHOUT PRESENCE OF ULCERS (sore throat, sores in mouth, or a toothache) UNUSUAL RASH, SWELLING OR PAIN  UNUSUAL VAGINAL DISCHARGE OR ITCHING   Items with * indicate a potential emergency and should be followed up as soon as possible or go to the Emergency Department if any problems should occur.  Please show the CHEMOTHERAPY ALERT CARD or IMMUNOTHERAPY ALERT CARD at check-in to  the Emergency Department and triage nurse.  Should you have questions after your visit or need to cancel or reschedule your appointment, please contact [facility]  Dept: [phone]  and follow the prompts.  Office hours are 8:00 a.m. to 4:30 p.m. Monday - Friday. Please note that voicemails left after 4:00 p.m. may not be returned until the following business day.  We are closed weekends and major holidays. You have access to a nurse at all times for urgent questions. Please call the main number to the clinic Dept: [phone] and follow the prompts.   For any non-urgent questions, you may also contact your provider using MyChart. We now offer e-Visits for anyone 16 and older to request care online for non-urgent symptoms. For details visit [email or web address].   Also download the MyChart app! Go to the app store, search "MyChart", open the app, select [facility], and log in with your MyChart username and password.  Due to Covid, a mask is required upon entering the hospital/clinic. If you do not have a mask, one will be given to you upon arrival. For doctor visits, patients may have 1 support person aged 50 or older with them. For treatment visits, patients cannot have anyone with them due to current Covid guidelines and our immunocompromised population.

## 2021-04-24 ENCOUNTER — Encounter: Payer: Self-pay | Admitting: Internal Medicine

## 2021-04-24 ENCOUNTER — Other Ambulatory Visit: Payer: Self-pay | Admitting: Internal Medicine

## 2021-04-24 MED ORDER — FUROSEMIDE 20 MG PO TABS: ORAL_TABLET | ORAL | 0 refills | Status: DC

## 2021-05-05 ENCOUNTER — Ambulatory Visit (HOSPITAL_COMMUNITY)
Admission: RE | Admit: 2021-05-05 | Discharge: 2021-05-05 | Disposition: A | Discharge status: Home / Self Care | Payer: Medicare Other | Source: Ambulatory Visit | Attending: Internal Medicine | Admitting: Internal Medicine

## 2021-05-05 ENCOUNTER — Other Ambulatory Visit: Payer: Self-pay

## 2021-05-05 ENCOUNTER — Encounter (HOSPITAL_COMMUNITY): Payer: Self-pay

## 2021-05-05 DIAGNOSIS — C3490 Malignant neoplasm of unspecified part of unspecified bronchus or lung: Secondary | ICD-10-CM | POA: Diagnosis not present

## 2021-05-05 DIAGNOSIS — I700 Atherosclerosis of aorta: Secondary | ICD-10-CM | POA: Diagnosis not present

## 2021-05-05 DIAGNOSIS — N210 Calculus in bladder: Secondary | ICD-10-CM | POA: Diagnosis not present

## 2021-05-05 DIAGNOSIS — C7951 Secondary malignant neoplasm of bone: Secondary | ICD-10-CM | POA: Diagnosis not present

## 2021-05-05 DIAGNOSIS — K5730 Diverticulosis of large intestine without perforation or abscess without bleeding: Secondary | ICD-10-CM | POA: Diagnosis not present

## 2021-05-05 DIAGNOSIS — J90 Pleural effusion, not elsewhere classified: Secondary | ICD-10-CM | POA: Diagnosis not present

## 2021-05-05 MED ORDER — IOHEXOL 350 MG/ML SOLN
80.0000 mL | Freq: Once | INTRAVENOUS | Status: AC | PRN
  Administered 2021-05-05: 80 mL via INTRAVENOUS

## 2021-05-06 MED FILL — Dexamethasone Sodium Phosphate Inj 100 MG/10ML: INTRAMUSCULAR | Qty: 1 | Status: AC

## 2021-05-07 ENCOUNTER — Encounter: Payer: Self-pay | Admitting: Internal Medicine

## 2021-05-07 ENCOUNTER — Inpatient Hospital Stay: Payer: Medicare Other | Attending: Internal Medicine | Admitting: Internal Medicine

## 2021-05-07 ENCOUNTER — Inpatient Hospital Stay: Payer: Medicare Other

## 2021-05-07 ENCOUNTER — Other Ambulatory Visit: Payer: Self-pay

## 2021-05-07 ENCOUNTER — Inpatient Hospital Stay: Payer: Medicare Other | Admitting: Dietician

## 2021-05-07 VITALS — BP 134/82 | HR 102 | Temp 98.0°F | Resp 20 | Ht 71.0 in | Wt 185.7 lb

## 2021-05-07 DIAGNOSIS — C342 Malignant neoplasm of middle lobe, bronchus or lung: Secondary | ICD-10-CM | POA: Insufficient documentation

## 2021-05-07 DIAGNOSIS — C7931 Secondary malignant neoplasm of brain: Secondary | ICD-10-CM | POA: Diagnosis not present

## 2021-05-07 DIAGNOSIS — Z5112 Encounter for antineoplastic immunotherapy: Secondary | ICD-10-CM | POA: Diagnosis not present

## 2021-05-07 DIAGNOSIS — C3491 Malignant neoplasm of unspecified part of right bronchus or lung: Secondary | ICD-10-CM

## 2021-05-07 DIAGNOSIS — Z95828 Presence of other vascular implants and grafts: Secondary | ICD-10-CM

## 2021-05-07 DIAGNOSIS — Z79899 Other long term (current) drug therapy: Secondary | ICD-10-CM | POA: Diagnosis not present

## 2021-05-07 DIAGNOSIS — C787 Secondary malignant neoplasm of liver and intrahepatic bile duct: Secondary | ICD-10-CM | POA: Insufficient documentation

## 2021-05-07 DIAGNOSIS — Z9221 Personal history of antineoplastic chemotherapy: Secondary | ICD-10-CM | POA: Insufficient documentation

## 2021-05-07 DIAGNOSIS — R059 Cough, unspecified: Secondary | ICD-10-CM | POA: Diagnosis not present

## 2021-05-07 LAB — CMP (CANCER CENTER ONLY)
ALT: 35 U/L (ref 0–44)
AST: 26 U/L (ref 15–41)
Albumin: 2.2 g/dL — ABNORMAL LOW (ref 3.5–5.0)
Alkaline Phosphatase: 212 U/L — ABNORMAL HIGH (ref 38–126)
Anion gap: 9 (ref 5–15)
BUN: 17 mg/dL (ref 8–23)
CO2: 28 mmol/L (ref 22–32)
Calcium: 8.3 mg/dL — ABNORMAL LOW (ref 8.9–10.3)
Chloride: 103 mmol/L (ref 98–111)
Creatinine: 0.95 mg/dL (ref 0.61–1.24)
GFR, Estimated: >60 mL/min (ref >60)
Glucose, Bld: 102 mg/dL — ABNORMAL HIGH (ref 70–99)
Potassium: 3.4 mmol/L — ABNORMAL LOW (ref 3.5–5.1)
Sodium: 140 mmol/L (ref 135–145)
Total Bilirubin: 0.4 mg/dL (ref 0.3–1.2)
Total Protein: 5.3 g/dL — ABNORMAL LOW (ref 6.5–8.1)

## 2021-05-07 LAB — CBC WITH DIFFERENTIAL (CANCER CENTER ONLY)
Abs Immature Granulocytes: 0.01 10*3/uL (ref 0.00–0.07)
Basophils Absolute: 0 10*3/uL (ref 0.0–0.1)
Basophils Relative: 0 %
Eosinophils Absolute: 0.1 10*3/uL (ref 0.0–0.5)
Eosinophils Relative: 1 %
HCT: 43.8 % (ref 39.0–52.0)
Hemoglobin: 14.8 g/dL (ref 13.0–17.0)
Immature Granulocytes: 0 %
Lymphocytes Relative: 18 %
Lymphs Abs: 0.9 10*3/uL (ref 0.7–4.0)
MCH: 31.7 pg (ref 26.0–34.0)
MCHC: 33.8 g/dL (ref 30.0–36.0)
MCV: 93.8 fL (ref 80.0–100.0)
Monocytes Absolute: 0.4 10*3/uL (ref 0.1–1.0)
Monocytes Relative: 8 %
Neutro Abs: 3.6 10*3/uL (ref 1.7–7.7)
Neutrophils Relative %: 73 %
Platelet Count: 213 10*3/uL (ref 150–400)
RBC: 4.67 MIL/uL (ref 4.22–5.81)
RDW: 14 % (ref 11.5–15.5)
WBC Count: 5 10*3/uL (ref 4.0–10.5)
nRBC: 0 % (ref 0.0–0.2)

## 2021-05-07 MED ORDER — CLINDAMYCIN PHOSPHATE 1 % EX GEL: Freq: Two times a day (BID) | CUTANEOUS | 0 refills | Status: DC

## 2021-05-07 MED ORDER — SODIUM CHLORIDE 0.9% FLUSH
10.0000 mL | INTRAVENOUS | Status: DC | PRN
  Administered 2021-05-07: 10 mL

## 2021-05-07 MED ORDER — DEXAMETHASONE 4 MG PO TABS: ORAL_TABLET | ORAL | 1 refills | Status: DC

## 2021-05-07 MED ORDER — HYDROCODONE BIT-HOMATROP MBR 5-1.5 MG/5ML PO SOLN: 5.0000 mL | Freq: Four times a day (QID) | ORAL | 0 refills | Status: AC | PRN

## 2021-05-07 NOTE — Progress Notes (Signed)
START ON PATHWAY REGIMEN - Non-Small Cell Lung     A cycle is every 21 days:     Docetaxel   **Always confirm dose/schedule in your pharmacy ordering system**  Patient Characteristics: Stage IV Metastatic, Nonsquamous, Molecular Analysis Completed, Molecular Alteration Present and Targeted Therapy Exhausted OR EGFR Exon 20+ or KRAS G12C+ Present and No Prior Chemo/Immunotherapy OR No Alteration Present, Third Line -  Chemotherapy/Immunotherapy, PS = 0, 1, Prior PD-1/PD-L1 Inhibitor or No Prior PD-1/PD-L1 Inhibitor and Not a Candidate for Immunotherapy Therapeutic Status: Stage IV Metastatic Histology: Nonsquamous Cell Broad Molecular Profiling Status: Molecular Analysis Completed Molecular Analysis Results: EGFR Exon 20 Insertion Present and No Prior Platinum-based Chemotherapy ECOG Performance Status: 1 Chemotherapy/Immunotherapy Line of Therapy: Third Line Chemotherapy/Immunotherapy Immunotherapy Candidate Status: Not a Candidate for Immunotherapy Prior Immunotherapy Status: Prior PD-1/PD-L1 Inhibitor Intent of Therapy: Non-Curative / Palliative Intent, Discussed with Patient

## 2021-05-07 NOTE — Progress Notes (Signed)
[place] Telephone:(336) [phone]   Fax:(336) [phone]  OFFICE PROGRESS NOTE  Ethan Morale, MD [place] [address]  DIAGNOSIS: Stage IV (T2b, N2, M1c) presented with right middle lobe lung mass in addition to mediastinal lymphadenopathy as well as malignant right pleural effusion with pleural-based nodules and suspicious right hepatic lesion and the brain metastasis diagnosed in July 2020.  Molecular Biomarkers: [id number] (Exon 20 insertion) 0.2% Dacomitinib,Neratinib,Osimertinib  [id number] 0.1% None   PRIOR THERAPY:  1) Systemic chemotherapy with carboplatin for AUC of 5, Alimta 500 mg/M2 and Keytruda 200 mg IV every 3 weeks.  First dose April 04, 2019.  Status post 21 cycles  Starting from cycle #5 the patient is Ethan [name] maintenance treatment with Alimta and Keytruda every 3 weeks.  Starting from cycle #19 he will be Ethan [name] single agent Alimta.  Ethan Ethan [name] was discontinued secondary to recurrent immunotherapy mediated pneumonitis. 2) whole brain irradiation for multiple metastatic brain lesions. 3)  Mobocertinib (Axkibity) 585 mg p.o. daily.  First dose was June 29, 2020.  Status post 6 months of treatment.  This was discontinued secondary to disease progression in the brain as well as the chest. 4) treatment was targeted therapy with Tagrisso 80 mg p.o. daily status post 7 weeks of treatment discontinued secondary to disease progression. 5) Amivantamab 350 Mg IV Ethan [name] day 1 followed by 1050 Mg IV Ethan [name] day 2 of cycle #1 started Ethan [name] March 12, 2021 status post 1 cycle.  Starting from cycle #2 his dose will be 1400 Mg IV weekly for 3 cycles followed by 1400 Mg IV every 2 weeks starting from cycle #5.  Status post 6 cycles.  This is discontinued secondary to disease progression.  CURRENT THERAPY: Systemic chemotherapy with docetaxel 75 Mg/M2 every 3 weeks with Neulasta support.  Status post 05/14/2021.   INTERVAL HISTORY: Ethan Ethan [name] 70 y.o.  male returns to the clinic today for follow-up visit.  The patient continues to complain of increasing fatigue and weakness as well as swelling of the left lower extremity but much better than before.  He was treated with Lasix with improvement of the swelling.  He continues to have therapy for scabs and rash Ethan [name] the face as well as the scalp.  He noticed these scabs more prominent after starting treatment with Eliquis for the deep venous thrombosis but this is likely also could be related to his treatment with monoclonal antibody with the Amivantamab.  He was given prescription for doxycycline capsule but he has not started it.  The patient continues to have a dry cough and requesting refill of Hycodan.  He denied having any nausea, vomiting, diarrhea or constipation.  He has shortness of breath with minimal exertion but no significant chest pain or hemoptysis.  He has no current fever or chills.  He had repeat CT scan of the chest, abdomen pelvis performed recently and he is here for evaluation and discussion of his discuss results and treatment options.  MEDICAL HISTORY: Past Medical History:  Diagnosis Date   Borderline hypertension    Brain metastasis (Ethan Ethan [name]) dx'd 01/2019   Detached vitreous humor, left    Dyspnea    ED (erectile dysfunction) of organic origin    Hyperlipidemia    Hypertension    Lens subluxation, left    Lung cancer ([place]) dx'd 01/2019   Stage IV   Migraine headache    takes PRN Imitrex   Pseudophakia    TGA (transient global  amnesia) 2017    ALLERGIES:  has No Known Allergies.  MEDICATIONS:  Current Outpatient Medications  Medication Sig Dispense Refill   amivantamab-vmjw 1,050 mg in sodium chloride 0.9 % 250 mL Inject 1,050 mg into the vein once.     apixaban (ELIQUIS) 5 MG TABS tablet Take 1 tablet (5 mg total) by mouth 2 (two) times daily. 60 tablet 3   cycloSPORINE (RESTASIS) 0.05 % ophthalmic emulsion Place 1 drop into both eyes 2 (two) times daily.     furosemide  (LASIX) 20 MG tablet 1 tablet p.o. daily Ethan [name] as-needed basis for swelling. 30 tablet 0   hydrochlorothiazide (MICROZIDE) 12.5 MG capsule TAKE 1 CAPSULE BY MOUTH EVERY DAY (Patient taking differently: Take 12.5 mg by mouth every other day.) 90 capsule 3   HYDROcodone bit-homatropine (HYCODAN) 5-1.5 MG/5ML syrup Take 5 mLs by mouth every 6 (six) hours as needed for cough. 473 mL 0   hydrocortisone 1 % lotion Apply 1 application topically 2 (two) times daily. 113 g 0   lidocaine-prilocaine (EMLA) cream Apply to the Port-A-Cath site 30-60 minutes before treatment 30 g 0   memantine (NAMENDA) 10 MG tablet TAKE 1 TABLET BY MOUTH TWICE A DAY (Patient taking differently: Take 10 mg by mouth 2 (two) times daily.) 180 tablet 2   mirtazapine (REMERON) 15 MG tablet Take 1 tablet (15 mg total) by mouth at bedtime. 30 tablet 2   mupirocin ointment (BACTROBAN) 2 % 2 (two) times daily as needed.     tamsulosin (FLOMAX) 0.4 MG CAPS capsule Take 0.4 mg by mouth daily.     doxycycline (VIBRAMYCIN) 100 MG capsule Take 1 capsule (100 mg total) by mouth 2 (two) times daily. (Patient not taking: No sig reported) 14 capsule 0   LORazepam (ATIVAN) 1 MG tablet Take 1 tablet (1 mg total) by mouth as needed for anxiety (30 minutes prior to radiation treatment and may repeat just prior to procedure if needed.). (Patient not taking: No sig reported) 10 tablet 0   magic mouthwash SOLN Take 5 mLs by mouth 4 (four) times daily as needed for mouth pain. Swish and spit or swallow (Patient not taking: No sig reported) 240 mL 0   omeprazole (PRILOSEC) 20 MG capsule Take 20 mg by mouth daily. (Patient not taking: Reported Ethan [name] 04/04/2021)     prochlorperazine (COMPAZINE) 10 MG tablet Take 1 tablet (10 mg total) by mouth every 6 (six) hours as needed for nausea or vomiting. (Patient not taking: No sig reported) 30 tablet 1   sildenafil (REVATIO) 20 MG tablet TAKE AS DIRECTED (Patient not taking: No sig reported) 10 tablet 11   No current  facility-administered medications for this visit.    SURGICAL HISTORY:  Past Surgical History:  Procedure Laterality Date   CAROTID DOPPLERS  09/2017   Mild non-obstructive disease   CATARACT EXTRACTION, BILATERAL     cataract, left  2018   CHEST TUBE INSERTION Right 03/29/2019   Procedure: INSERTION PLEURAL DRAINAGE CATHETER;  Surgeon: [name], MD;  Location: [facility] OR;  Service: Thoracic;  Laterality: Right;   COLONOSCOPY  2016   clear, repeat in 10 yrs    Eye surgeries     , Lens attachment.  Vitrectomy   FEMORAL HERNIA REPAIR     HERNIA REPAIR     IR THORACENTESIS ASP PLEURAL SPACE W/IMG GUIDE  02/22/2019   IR THORACENTESIS ASP PLEURAL SPACE W/IMG GUIDE  03/13/2019   IR THORACENTESIS ASP PLEURAL SPACE W/IMG GUIDE  03/18/2020  LUMBAR LAMINECTOMY     PLEURAL BIOPSY Right 03/29/2019   Procedure: PLEURAL BIOPSY;  Surgeon: [name], MD;  Location: [facility];  Service: Thoracic;  Laterality: Right;   PORTACATH PLACEMENT N/A 03/29/2019   Procedure: INSERTION PORT-A-CATH;  Surgeon: [name], MD;  Location: [facility];  Service: Thoracic;  Laterality: N/A;   retinal attachment, right     sclearl buckle, right     TRANSTHORACIC ECHOCARDIOGRAM  12/14/2019   EF 50 EF 55% (low normal).  Abnormal septal wall motion related to BBB; indeterminate diastolic parameters.  Mild RV enlargement with mildly reduced function.  Normal valves.  Normal atrial sizes.   victrectomy,right     VIDEO ASSISTED THORACOSCOPY Right 03/29/2019   Procedure: VIDEO ASSISTED THORACOSCOPY;  Surgeon: [name], MD;  Location: [facility];  Service: Thoracic;  Laterality: Right;    REVIEW OF SYSTEMS:  Constitutional: positive for fatigue and weight loss Eyes: negative Ears, nose, mouth, throat, and face: negative Respiratory: positive for cough and dyspnea Ethan [name] exertion Cardiovascular: negative Gastrointestinal: negative Genitourinary:negative Integument/breast:  negative Hematologic/lymphatic: negative Musculoskeletal:positive for arthralgias and muscle weakness Neurological: positive for weakness Behavioral/Psych: negative Endocrine: negative Allergic/Immunologic: negative   PHYSICAL EXAMINATION: General appearance: alert, cooperative, fatigued, and no distress Head: Normocephalic, without obvious abnormality, atraumatic Neck: no adenopathy, no JVD, supple, symmetrical, trachea midline, and thyroid not enlarged, symmetric, no tenderness/mass/nodules Lymph nodes: Cervical, supraclavicular, and axillary nodes normal. Resp: diminished breath sounds LLL and RLL and dullness to percussion LLL and RLL Back: symmetric, no curvature. ROM normal. No CVA tenderness. Cardio: regular rate and rhythm, S1, S2 normal, no murmur, click, rub or gallop GI: soft, non-tender; bowel sounds normal; no masses,  no organomegaly Extremities: edema 1+ edema in the left lower extremity Neurologic: Alert and oriented X 3, normal strength and tone. Normal symmetric reflexes. Normal coordination and gait Skin exam: Petechial rash Ethan [name] the forehead and the face bilaterally.  ECOG PERFORMANCE STATUS: 1 - Symptomatic but completely ambulatory  Blood pressure 134/82, pulse (!) 102, temperature 98 F (36.7 C), temperature source Tympanic, resp. rate 20, height $RemoveBe'5\' 11"'rKdlMDNnL$  (1.803 m), weight 185 lb 11.2 oz (84.2 kg), SpO2 98 %.  LABORATORY DATA: Lab Results  Component Value Date   WBC 5.0 05/07/2021   HGB 14.8 05/07/2021   HCT 43.8 05/07/2021   MCV 93.8 05/07/2021   PLT 213 05/07/2021      Chemistry      Component Value Date/Time   NA 140 05/07/2021 0936   K 3.4 (L) 05/07/2021 0936   CL 103 05/07/2021 0936   CO2 28 05/07/2021 0936   BUN 17 05/07/2021 0936   CREATININE 0.95 05/07/2021 0936      Component Value Date/Time   CALCIUM 8.3 (L) 05/07/2021 0936   ALKPHOS 212 (H) 05/07/2021 0936   AST 26 05/07/2021 0936   ALT 35 05/07/2021 0936   BILITOT 0.4 05/07/2021 0936        RADIOGRAPHIC STUDIES: CT Chest W Contrast  Result Date: 05/05/2021 CLINICAL DATA:  Non-small cell lung cancer staging, chemotherapy and XRT complete, ongoing immunotherapy EXAM: CT CHEST, ABDOMEN, AND PELVIS WITH CONTRAST TECHNIQUE: Multidetector CT imaging of the chest, abdomen and pelvis was performed following the standard protocol during bolus administration of intravenous contrast. CONTRAST:  59mL OMNIPAQUE IOHEXOL 350 MG/ML SOLN, additional oral enteric contrast COMPARISON:  02/27/2021 FINDINGS: CT CHEST FINDINGS Cardiovascular: Right chest port catheter. Aortic atherosclerosis. Normal heart size. No pericardial effusion. Mediastinum/Nodes: Unchanged appearance of prominent subcentimeter mediastinal lymph nodes  and soft tissue thickening about the lower trachea and hila (series 2, image 25). Thyroid gland, trachea, and esophagus demonstrate no significant findings. Lungs/Pleura: Moderate left pleural effusion, increased compared to prior examination. Interval increase in extensive heterogeneous airspace opacity, nodularity, and interlobular septal thickening throughout the right lung. Small, loculated appearing right pleural effusion with pleural thickening. Musculoskeletal: No chest wall mass. CT ABDOMEN PELVIS FINDINGS Hepatobiliary: No solid liver abnormality is seen. No gallstones, gallbladder wall thickening, or biliary dilatation. Pancreas: Unremarkable. No pancreatic ductal dilatation or surrounding inflammatory changes. Spleen: Normal in size without significant abnormality. Adrenals/Urinary Tract: Adrenal glands are unremarkable. Kidneys are normal, without renal calculi, solid lesion, or hydronephrosis. Thickening of the urinary bladder wall with small diverticula. Small calculi in the dependent bladder (series 2, image 113). Stomach/Bowel: Stomach is within normal limits. Appendix appears normal. No evidence of bowel wall thickening, distention, or inflammatory changes. Descending and  sigmoid diverticulosis. Vascular/Lymphatic: No significant vascular findings are present. No enlarged abdominal or pelvic lymph nodes. Reproductive: Prostatomegaly. Other: No abdominal wall hernia or abnormality. No abdominopelvic ascites. Unchanged nonspecific fat stranding in the central small bowel mesentery (series 2, image 81). Musculoskeletal: Multiple new and enlarged sclerotic osseous metastases, for example of the L1 vertebral body and T10 vertebral body (series 6, image 117). IMPRESSION: 1. Interval increase in extensive heterogeneous airspace opacity, nodularity, and interlobular septal thickening throughout the right lung. Findings are consistent with worsened lymphangitic spread of disease. 2. Unchanged appearance of prominent subcentimeter mediastinal lymph nodes and soft tissue thickening about the lower trachea and hila. 3. Small, loculated appearing right pleural effusion with metastatic pleural thickening. 4. Moderate left pleural effusion, increased compared to prior examination, presumed malignant however without pleural thickening or other direct evidence of pleural metastatic disease. 5. Multiple new and enlarged sclerotic osseous metastases, consistent with worsened, although treated osseous metastatic disease. 6. No evidence of organ metastatic disease within the abdomen or pelvis. 7. Prostatomegaly and thickening of the urinary bladder wall with small diverticula, likely due to chronic outlet obstruction. 8. Small calculi in the dependent bladder. Aortic Atherosclerosis (ICD10-I70.0). Electronically Signed   By: Eddie Candle M.D.   Ethan [name]: 05/05/2021 17:40   CT Abdomen Pelvis W Contrast  Result Date: 05/05/2021 CLINICAL DATA:  Non-small cell lung cancer staging, chemotherapy and XRT complete, ongoing immunotherapy EXAM: CT CHEST, ABDOMEN, AND PELVIS WITH CONTRAST TECHNIQUE: Multidetector CT imaging of the chest, abdomen and pelvis was performed following the standard protocol during bolus  administration of intravenous contrast. CONTRAST:  62mL OMNIPAQUE IOHEXOL 350 MG/ML SOLN, additional oral enteric contrast COMPARISON:  02/27/2021 FINDINGS: CT CHEST FINDINGS Cardiovascular: Right chest port catheter. Aortic atherosclerosis. Normal heart size. No pericardial effusion. Mediastinum/Nodes: Unchanged appearance of prominent subcentimeter mediastinal lymph nodes and soft tissue thickening about the lower trachea and hila (series 2, image 25). Thyroid gland, trachea, and esophagus demonstrate no significant findings. Lungs/Pleura: Moderate left pleural effusion, increased compared to prior examination. Interval increase in extensive heterogeneous airspace opacity, nodularity, and interlobular septal thickening throughout the right lung. Small, loculated appearing right pleural effusion with pleural thickening. Musculoskeletal: No chest wall mass. CT ABDOMEN PELVIS FINDINGS Hepatobiliary: No solid liver abnormality is seen. No gallstones, gallbladder wall thickening, or biliary dilatation. Pancreas: Unremarkable. No pancreatic ductal dilatation or surrounding inflammatory changes. Spleen: Normal in size without significant abnormality. Adrenals/Urinary Tract: Adrenal glands are unremarkable. Kidneys are normal, without renal calculi, solid lesion, or hydronephrosis. Thickening of the urinary bladder wall with small diverticula. Small calculi in the dependent bladder (series  2, image 113). Stomach/Bowel: Stomach is within normal limits. Appendix appears normal. No evidence of bowel wall thickening, distention, or inflammatory changes. Descending and sigmoid diverticulosis. Vascular/Lymphatic: No significant vascular findings are present. No enlarged abdominal or pelvic lymph nodes. Reproductive: Prostatomegaly. Other: No abdominal wall hernia or abnormality. No abdominopelvic ascites. Unchanged nonspecific fat stranding in the central small bowel mesentery (series 2, image 81). Musculoskeletal: Multiple new  and enlarged sclerotic osseous metastases, for example of the L1 vertebral body and T10 vertebral body (series 6, image 117). IMPRESSION: 1. Interval increase in extensive heterogeneous airspace opacity, nodularity, and interlobular septal thickening throughout the right lung. Findings are consistent with worsened lymphangitic spread of disease. 2. Unchanged appearance of prominent subcentimeter mediastinal lymph nodes and soft tissue thickening about the lower trachea and hila. 3. Small, loculated appearing right pleural effusion with metastatic pleural thickening. 4. Moderate left pleural effusion, increased compared to prior examination, presumed malignant however without pleural thickening or other direct evidence of pleural metastatic disease. 5. Multiple new and enlarged sclerotic osseous metastases, consistent with worsened, although treated osseous metastatic disease. 6. No evidence of organ metastatic disease within the abdomen or pelvis. 7. Prostatomegaly and thickening of the urinary bladder wall with small diverticula, likely due to chronic outlet obstruction. 8. Small calculi in the dependent bladder. Aortic Atherosclerosis (ICD10-I70.0). Electronically Signed   By: Eddie Candle M.D.   Ethan [name]: 05/05/2021 17:40     ASSESSMENT AND PLAN: This is a very pleasant 70 years old white male recently diagnosed with a stage IV (T2b, N2, M1C) non-small cell lung cancer, adenocarcinoma with positive EGFR mutation in exon 20 (resistant mutation) diagnosed in July 2020 and presented with extensive disease involving the right upper lobe as well as the right middle and lower lobe with extensive pleural based metastasis as well as mediastinal and hilar disease with malignant pleural effusion as well as liver and brain metastasis. Unfortunately the patient has no actionable mutations based Ethan [name] the molecular studies by [employer].   The patient is currently undergoing systemic chemotherapy with carboplatin for AUC of 5,  Alimta 500 mg/M2 and Keytruda 200 mg IV every 3 weeks status post 21 cycles.  Starting from cycle #5 the patient is Ethan [name] maintenance treatment with Alimta and Keytruda. Ethan Ethan [name] was discontinued after cycle #18 secondary to recurrent immunotherapy mediated pneumonitis. Starting from cycle #19 the patient will be Ethan [name] treatment with single agent Alimta every 3 weeks. The patient has been tolerating the treatment well. He had repeat PET scan and MRI of the brain performed recently.  The PET scan showed evidence for disease progression with metastatic disease involving the thoracic and abdominal lymph nodes as well as the right hemithorax and bones.  He also has loculated moderate right pleural effusion and small left pleural effusion.  The PET scan also showed prostate enlargement and his PSA has been rising recently.  He was seen by urology. MRI of the brain showed development of new 5 mm right temporal lesion suspicious for metastatic disease as well as new 2 mm focus of enhancement in the left periventricular white matter also suspicious for metastatic disease.  He was seen by radiation oncology and expected to have P[place]iss to this lesion soon. The patient started treatment with Mobocertinib (Axkibity) 161 mg p.o. daily.  Status post 6 months of treatment.  He has been tolerating the treatment well with no concerning adverse effects except for the dryness of his nose that is not responding to the over-the-counter medication.  His  treatment was discontinued secondary to disease progression. Unfortunately he had significant disease progression in the brain with innumerable brain metastasis in addition to previous increase in some of the pulmonary nodules seen Ethan [name] the previous imaging studies.  He completed a course of whole brain irradiation. The patient was treated with Tagrisso 80 mg p.o. daily status post 7 weeks of treatment discontinued secondary to disease progression. The patient started last week the first  dose of his treatment with Amivantamab with reduced dose of 350 Mg IV Ethan [name] day 1 and 1050 Mg IV Ethan [name] day 2.  He has hypersensitivity reaction Ethan [name] day 1 required a lot of premedication and treatment with Solu-Medrol, Pepcid and Benadryl as well as Demerol.  He started tolerating his treatment fairly well with no concerning adverse effects starting from cycle #2. He is status post 6 cycles.   The patient tolerated the last cycle of his treatment fairly well. He had repeat CT scan of the chest, abdomen pelvis performed recently.  I personally and independently reviewed the scan images and discussed results with the patient today. Unfortunately his scan showed evidence for disease progression with increase in bilateral pleural effusion as well as the lymphangitic spread of the tumor in the lung.  He also has worsening bone metastasis. I had a lengthy discussion with the patient today about his condition and treatment options.  I discussed with the patient the option of switching his treatment with systemic chemotherapy with single agent docetaxel 75 Mg/M2 every 3 weeks with Neulasta support.  I would not be able to use Cyramza at least for now because of the recent diagnosis of deep venous thrombosis and treatment with Eliquis.  The patient was also giving the option of palliative care.  He is interested in proceeding with systemic therapy and expected to start the first cycle of this treatment next week.  I discussed with the patient the adverse effect of this treatment including but not limited to alopecia, myelosuppression, nausea and vomiting, peripheral neuropathy, liver or renal dysfunction. He would like to proceed with treatment as planned. I will call his pharmacy with prescription for Decadron 8 mg p.o. twice daily the day before the day of and day after the chemotherapy every 3 weeks. He was also advised to use pain medication and Claritin for few days after the Neulasta injection if he has aching pain and  fatigue from the injection. For the skin rash and crossed Ethan [name] the head and face, the patient was given prescription last visit with doxycycline 100 mg p.o. twice daily.  He has not started it yet but will start the treatment today. I will also give him prescription for clindamycin lotion to apply to the skin rash area. For the deep venous thrombosis, he will continue his current treatment with Eliquis. I will see the patient back for follow-up visit in around 4 weeks for evaluation with the start of cycle #2 of his treatment. For the cough I will give him refill of Hycodan. The patient was advised to call immediately if he has any other concerning symptoms in the interval. The total time spent in the appointment was 55 minutes.   The patient voices understanding of current disease status and treatment options and is in agreement with the current care plan. All questions were answered. The patient knows to call the clinic with any problems, questions or concerns. We can certainly see the patient much sooner if necessary.  Disclaimer: This note was dictated with voice recognition software. Similar  sounding words can inadvertently be transcribed and may not be corrected upon review.

## 2021-05-07 NOTE — Progress Notes (Deleted)
Nutrition Follow-up:  Patient with stage IV lung cancer with brain metastases. He is s/p whole brain radiation 12/31/20. Patient currently receiving Amivantamab.     Medications: Eliquis, Lasix, Remeron  Labs: ***  Anthropometrics: Weight *** today *** 193 lb 8 oz on 8/31  8/17 - 182 lb 5.1 oz 8/4 - 174 lb 2.6 oz   NUTRITION DIAGNOSIS: Unintended weight loss ***      INTERVENTION: ***    MONITORING, EVALUATION, GOAL: weight trends, intake   NEXT VISIT: ***

## 2021-05-09 ENCOUNTER — Other Ambulatory Visit: Payer: Self-pay

## 2021-05-09 ENCOUNTER — Ambulatory Visit (HOSPITAL_COMMUNITY)
Admission: RE | Admit: 2021-05-09 | Discharge: 2021-05-09 | Disposition: A | Discharge status: Home / Self Care | Payer: Medicare Other | Source: Ambulatory Visit | Attending: Internal Medicine | Admitting: Internal Medicine

## 2021-05-09 ENCOUNTER — Ambulatory Visit (HOSPITAL_COMMUNITY)
Admission: RE | Admit: 2021-05-09 | Discharge: 2021-05-09 | Disposition: A | Discharge status: Home / Self Care | Payer: Medicare Other | Source: Ambulatory Visit | Attending: Radiology | Admitting: Radiology

## 2021-05-09 ENCOUNTER — Encounter: Payer: Self-pay | Admitting: Internal Medicine

## 2021-05-09 DIAGNOSIS — C3491 Malignant neoplasm of unspecified part of right bronchus or lung: Secondary | ICD-10-CM

## 2021-05-09 DIAGNOSIS — R918 Other nonspecific abnormal finding of lung field: Secondary | ICD-10-CM | POA: Diagnosis not present

## 2021-05-09 DIAGNOSIS — J90 Pleural effusion, not elsewhere classified: Secondary | ICD-10-CM | POA: Diagnosis not present

## 2021-05-09 DIAGNOSIS — Z9889 Other specified postprocedural states: Secondary | ICD-10-CM

## 2021-05-09 DIAGNOSIS — J984 Other disorders of lung: Secondary | ICD-10-CM | POA: Diagnosis not present

## 2021-05-09 DIAGNOSIS — C3490 Malignant neoplasm of unspecified part of unspecified bronchus or lung: Secondary | ICD-10-CM | POA: Insufficient documentation

## 2021-05-09 MED ORDER — DOXYCYCLINE HYCLATE 100 MG PO CAPS
100.0000 mg | ORAL_CAPSULE | Freq: Two times a day (BID) | ORAL | 1 refills | Status: DC
Start: 2021-05-09 — End: 2021-06-09

## 2021-05-09 MED ORDER — LIDOCAINE HCL 1 % IJ SOLN
INTRAMUSCULAR | Status: AC
  Filled 2021-05-09: qty 20

## 2021-05-09 NOTE — Telephone Encounter (Signed)
I spoke with pt and advised as indicated. Pt expressed understanding and requested we send a refill.

## 2021-05-09 NOTE — Procedures (Signed)
Ultrasound-guided  therapeutic left thoracentesis performed yielding 1 liter of amber fluid. No immediate complications. Follow-up chest x-ray pending. EBL< 2 cc.

## 2021-05-10 ENCOUNTER — Encounter: Payer: Self-pay | Admitting: Internal Medicine

## 2021-05-10 DIAGNOSIS — Z23 Encounter for immunization: Secondary | ICD-10-CM | POA: Diagnosis not present

## 2021-05-12 ENCOUNTER — Other Ambulatory Visit: Payer: Self-pay | Admitting: Internal Medicine

## 2021-05-12 DIAGNOSIS — I824Z2 Acute embolism and thrombosis of unspecified deep veins of left distal lower extremity: Secondary | ICD-10-CM

## 2021-05-12 MED ORDER — APIXABAN 5 MG PO TABS: 5.0000 mg | ORAL_TABLET | Freq: Two times a day (BID) | ORAL | 3 refills | Status: DC

## 2021-05-12 MED ORDER — FUROSEMIDE 20 MG PO TABS: 20.0000 mg | ORAL_TABLET | Freq: Two times a day (BID) | ORAL | 0 refills | Status: DC

## 2021-05-13 MED FILL — Dexamethasone Sodium Phosphate Inj 100 MG/10ML: INTRAMUSCULAR | Qty: 1 | Status: AC

## 2021-05-14 ENCOUNTER — Inpatient Hospital Stay: Payer: Medicare Other

## 2021-05-14 ENCOUNTER — Other Ambulatory Visit: Payer: Self-pay

## 2021-05-14 ENCOUNTER — Inpatient Hospital Stay: Payer: Medicare Other | Admitting: Dietician

## 2021-05-14 VITALS — BP 118/72 | HR 78 | Temp 98.2°F | Resp 18

## 2021-05-14 DIAGNOSIS — Z5112 Encounter for antineoplastic immunotherapy: Secondary | ICD-10-CM | POA: Diagnosis not present

## 2021-05-14 DIAGNOSIS — C3491 Malignant neoplasm of unspecified part of right bronchus or lung: Secondary | ICD-10-CM

## 2021-05-14 DIAGNOSIS — C787 Secondary malignant neoplasm of liver and intrahepatic bile duct: Secondary | ICD-10-CM | POA: Diagnosis not present

## 2021-05-14 DIAGNOSIS — Z9221 Personal history of antineoplastic chemotherapy: Secondary | ICD-10-CM | POA: Diagnosis not present

## 2021-05-14 DIAGNOSIS — Z95828 Presence of other vascular implants and grafts: Secondary | ICD-10-CM

## 2021-05-14 DIAGNOSIS — C7931 Secondary malignant neoplasm of brain: Secondary | ICD-10-CM | POA: Diagnosis not present

## 2021-05-14 DIAGNOSIS — C342 Malignant neoplasm of middle lobe, bronchus or lung: Secondary | ICD-10-CM | POA: Diagnosis not present

## 2021-05-14 DIAGNOSIS — Z79899 Other long term (current) drug therapy: Secondary | ICD-10-CM | POA: Diagnosis not present

## 2021-05-14 LAB — CMP (CANCER CENTER ONLY)
ALT: 29 U/L (ref 0–44)
AST: 21 U/L (ref 15–41)
Albumin: 2.1 g/dL — ABNORMAL LOW (ref 3.5–5.0)
Alkaline Phosphatase: 211 U/L — ABNORMAL HIGH (ref 38–126)
Anion gap: 9 (ref 5–15)
BUN: 31 mg/dL — ABNORMAL HIGH (ref 8–23)
CO2: 24 mmol/L (ref 22–32)
Calcium: 8.5 mg/dL — ABNORMAL LOW (ref 8.9–10.3)
Chloride: 106 mmol/L (ref 98–111)
Creatinine: 1 mg/dL (ref 0.61–1.24)
GFR, Estimated: >60 mL/min (ref >60)
Glucose, Bld: 117 mg/dL — ABNORMAL HIGH (ref 70–99)
Potassium: 3.7 mmol/L (ref 3.5–5.1)
Sodium: 139 mmol/L (ref 135–145)
Total Bilirubin: 0.4 mg/dL (ref 0.3–1.2)
Total Protein: 5.2 g/dL — ABNORMAL LOW (ref 6.5–8.1)

## 2021-05-14 LAB — CBC WITH DIFFERENTIAL (CANCER CENTER ONLY)
Abs Immature Granulocytes: 0.02 10*3/uL (ref 0.00–0.07)
Basophils Absolute: 0 10*3/uL (ref 0.0–0.1)
Basophils Relative: 0 %
Eosinophils Absolute: 0 10*3/uL (ref 0.0–0.5)
Eosinophils Relative: 0 %
HCT: 42.3 % (ref 39.0–52.0)
Hemoglobin: 14.4 g/dL (ref 13.0–17.0)
Immature Granulocytes: 0 %
Lymphocytes Relative: 6 %
Lymphs Abs: 0.6 10*3/uL — ABNORMAL LOW (ref 0.7–4.0)
MCH: 31.9 pg (ref 26.0–34.0)
MCHC: 34 g/dL (ref 30.0–36.0)
MCV: 93.6 fL (ref 80.0–100.0)
Monocytes Absolute: 0.3 10*3/uL (ref 0.1–1.0)
Monocytes Relative: 3 %
Neutro Abs: 8.6 10*3/uL — ABNORMAL HIGH (ref 1.7–7.7)
Neutrophils Relative %: 91 %
Platelet Count: 231 10*3/uL (ref 150–400)
RBC: 4.52 MIL/uL (ref 4.22–5.81)
RDW: 13.8 % (ref 11.5–15.5)
WBC Count: 9.5 10*3/uL (ref 4.0–10.5)
nRBC: 0 % (ref 0.0–0.2)

## 2021-05-14 MED ORDER — SODIUM CHLORIDE 0.9 % IV SOLN
10.0000 mg | Freq: Once | INTRAVENOUS | Status: AC
  Administered 2021-05-14: 10 mg via INTRAVENOUS
  Filled 2021-05-14: qty 10

## 2021-05-14 MED ORDER — SODIUM CHLORIDE 0.9 % IV SOLN
75.0000 mg/m2 | Freq: Once | INTRAVENOUS | Status: AC
  Administered 2021-05-14: 150 mg via INTRAVENOUS
  Filled 2021-05-14: qty 15

## 2021-05-14 MED ORDER — SODIUM CHLORIDE 0.9% FLUSH
10.0000 mL | INTRAVENOUS | Status: DC | PRN
  Administered 2021-05-14: 10 mL

## 2021-05-14 MED ORDER — SODIUM CHLORIDE 0.9 % IV SOLN: Freq: Once | INTRAVENOUS | Status: AC

## 2021-05-14 NOTE — Progress Notes (Signed)
Nutrition Follow-up:  Patient currently receiving systemic chemotherapy with docetaxel with Neulasta support for metastatic lung cancer.   Met with patient during infusion. He is in good spirits, reports good appetite and is eating well. Patient recalls recently eating fish, beef, vegetables, black beans. He continues to drink CIB with fairlife milk, most days he will drink 2 supplements. Patient reports rash on forehead and face after starting Eliquis, this is healing. Patient does not feel lower extremity swelling has improved much with Eliquis, but he will continue to take as well as Lasix. Patient denies nutrition impact symptoms.   Medications: Remeron, Lasix, Eliquis, Hydrocodan, Compazine, Ativan  Labs: Glucose 117, Albumin 2.1, BUN 31  Anthropometrics: Weight 188.8 lb today in infusion (per pt report) increased from 185 lb 11.2 oz on 9/14   8/31 - 193 lb 8 oz (?) 8/17 - 182 lb 5.1 oz  8/10 - 178 lb 4.8   NUTRITION DIAGNOSIS: Unintentional weight loss stable   INTERVENTION:  Continue eating small frequent meals and snacks with adequate calories and protein Continue drinking CIB with Fairlife milk twice daily Patient has contact information    MONITORING, EVALUATION, GOAL: weight trends, intake   NEXT VISIT: Wednesday October 12 during infusion

## 2021-05-14 NOTE — Patient Instructions (Signed)
[facility]  Discharge Instructions: °Thank you for choosing [facility] to provide your oncology and hematology care.  ° °If you have a lab appointment with the Cancer Center, please go directly to the Cancer Center and check in at the registration area. °  °Wear comfortable clothing and clothing appropriate for easy access to any Portacath or PICC line.  ° °We strive to give you quality time with your provider. You may need to reschedule your appointment if you arrive late (15 or more minutes).  Arriving late affects you and other patients whose appointments are after yours.  Also, if you miss three or more appointments without notifying the office, you may be dismissed from the clinic at the provider’s discretion.    °  °For prescription refill requests, have your pharmacy contact our office and allow 72 hours for refills to be completed.   ° °Today you received the following chemotherapy and/or immunotherapy agents Docetaxel    °  °To help prevent nausea and vomiting after your treatment, we encourage you to take your nausea medication as directed. ° °BELOW ARE SYMPTOMS THAT SHOULD BE REPORTED IMMEDIATELY: °*FEVER GREATER THAN 100.4 F (38 °C) OR HIGHER °*CHILLS OR SWEATING °*NAUSEA AND VOMITING THAT IS NOT CONTROLLED WITH YOUR NAUSEA MEDICATION °*UNUSUAL SHORTNESS OF BREATH °*UNUSUAL BRUISING OR BLEEDING °*URINARY PROBLEMS (pain or burning when urinating, or frequent urination) °*BOWEL PROBLEMS (unusual diarrhea, constipation, pain near the anus) °TENDERNESS IN MOUTH AND THROAT WITH OR WITHOUT PRESENCE OF ULCERS (sore throat, sores in mouth, or a toothache) °UNUSUAL RASH, SWELLING OR PAIN  °UNUSUAL VAGINAL DISCHARGE OR ITCHING  ° °Items with * indicate a potential emergency and should be followed up as soon as possible or go to the Emergency Department if any problems should occur. ° °Please show the CHEMOTHERAPY ALERT CARD or IMMUNOTHERAPY ALERT CARD at check-in to  the Emergency Department and triage nurse. ° °Should you have questions after your visit or need to cancel or reschedule your appointment, please contact [facility]  Dept: [phone]  and follow the prompts.  Office hours are 8:00 a.m. to 4:30 p.m. Monday - Friday. Please note that voicemails left after 4:00 p.m. may not be returned until the following business day.  We are closed weekends and major holidays. You have access to a nurse at all times for urgent questions. Please call the main number to the clinic Dept: [phone] and follow the prompts. ° ° °For any non-urgent questions, you may also contact your provider using MyChart. We now offer e-Visits for anyone 18 and older to request care online for non-urgent symptoms. For details visit [email or web address]. °  °Also download the MyChart app! Go to the app store, search "MyChart", open the app, select [facility], and log in with your MyChart username and password. ° °Due to Covid, a mask is required upon entering the hospital/clinic. If you do not have a mask, one will be given to you upon arrival. For doctor visits, patients may have 1 support person aged 18 or older with them. For treatment visits, patients cannot have anyone with them due to current Covid guidelines and our immunocompromised population.  ° °

## 2021-05-16 ENCOUNTER — Other Ambulatory Visit: Payer: Self-pay | Admitting: Internal Medicine

## 2021-05-16 ENCOUNTER — Other Ambulatory Visit: Payer: Self-pay

## 2021-05-16 ENCOUNTER — Inpatient Hospital Stay: Payer: Medicare Other

## 2021-05-16 VITALS — BP 123/80 | HR 93 | Temp 98.3°F | Resp 18

## 2021-05-16 DIAGNOSIS — C787 Secondary malignant neoplasm of liver and intrahepatic bile duct: Secondary | ICD-10-CM | POA: Diagnosis not present

## 2021-05-16 DIAGNOSIS — C7931 Secondary malignant neoplasm of brain: Secondary | ICD-10-CM | POA: Diagnosis not present

## 2021-05-16 DIAGNOSIS — C342 Malignant neoplasm of middle lobe, bronchus or lung: Secondary | ICD-10-CM | POA: Diagnosis not present

## 2021-05-16 DIAGNOSIS — Z5112 Encounter for antineoplastic immunotherapy: Secondary | ICD-10-CM | POA: Diagnosis not present

## 2021-05-16 DIAGNOSIS — Z79899 Other long term (current) drug therapy: Secondary | ICD-10-CM | POA: Diagnosis not present

## 2021-05-16 DIAGNOSIS — C3491 Malignant neoplasm of unspecified part of right bronchus or lung: Secondary | ICD-10-CM

## 2021-05-16 DIAGNOSIS — Z9221 Personal history of antineoplastic chemotherapy: Secondary | ICD-10-CM | POA: Diagnosis not present

## 2021-05-16 MED ORDER — PEGFILGRASTIM-CBQV 6 MG/0.6ML ~~LOC~~ SOSY
6.0000 mg | PREFILLED_SYRINGE | Freq: Once | SUBCUTANEOUS | Status: AC
  Administered 2021-05-16: 6 mg via SUBCUTANEOUS
  Filled 2021-05-16: qty 0.6

## 2021-05-16 NOTE — Patient Instructions (Signed)

## 2021-05-21 ENCOUNTER — Other Ambulatory Visit: Payer: Medicare Other

## 2021-05-21 ENCOUNTER — Inpatient Hospital Stay (HOSPITAL_BASED_OUTPATIENT_CLINIC_OR_DEPARTMENT_OTHER): Payer: Medicare Other | Admitting: Internal Medicine

## 2021-05-21 ENCOUNTER — Inpatient Hospital Stay: Payer: Medicare Other

## 2021-05-21 ENCOUNTER — Ambulatory Visit: Payer: Medicare Other | Admitting: Internal Medicine

## 2021-05-21 ENCOUNTER — Other Ambulatory Visit: Payer: Self-pay

## 2021-05-21 ENCOUNTER — Ambulatory Visit: Payer: Medicare Other

## 2021-05-21 VITALS — BP 110/80 | HR 104 | Temp 98.4°F | Resp 17 | Wt 194.1 lb

## 2021-05-21 DIAGNOSIS — C342 Malignant neoplasm of middle lobe, bronchus or lung: Secondary | ICD-10-CM | POA: Diagnosis not present

## 2021-05-21 DIAGNOSIS — Z5112 Encounter for antineoplastic immunotherapy: Secondary | ICD-10-CM

## 2021-05-21 DIAGNOSIS — C7931 Secondary malignant neoplasm of brain: Secondary | ICD-10-CM

## 2021-05-21 DIAGNOSIS — C3491 Malignant neoplasm of unspecified part of right bronchus or lung: Secondary | ICD-10-CM

## 2021-05-21 DIAGNOSIS — Z79899 Other long term (current) drug therapy: Secondary | ICD-10-CM | POA: Diagnosis not present

## 2021-05-21 DIAGNOSIS — C787 Secondary malignant neoplasm of liver and intrahepatic bile duct: Secondary | ICD-10-CM | POA: Diagnosis not present

## 2021-05-21 DIAGNOSIS — Z9221 Personal history of antineoplastic chemotherapy: Secondary | ICD-10-CM | POA: Diagnosis not present

## 2021-05-21 DIAGNOSIS — Z95828 Presence of other vascular implants and grafts: Secondary | ICD-10-CM

## 2021-05-21 DIAGNOSIS — C3490 Malignant neoplasm of unspecified part of unspecified bronchus or lung: Secondary | ICD-10-CM

## 2021-05-21 LAB — CBC WITH DIFFERENTIAL (CANCER CENTER ONLY)
Abs Immature Granulocytes: 0.31 10*3/uL — ABNORMAL HIGH (ref 0.00–0.07)
Basophils Absolute: 0 10*3/uL (ref 0.0–0.1)
Basophils Relative: 1 %
Eosinophils Absolute: 0 10*3/uL (ref 0.0–0.5)
Eosinophils Relative: 0 %
HCT: 38.6 % — ABNORMAL LOW (ref 39.0–52.0)
Hemoglobin: 13.1 g/dL (ref 13.0–17.0)
Immature Granulocytes: 8 %
Lymphocytes Relative: 19 %
Lymphs Abs: 0.8 10*3/uL (ref 0.7–4.0)
MCH: 31.7 pg (ref 26.0–34.0)
MCHC: 33.9 g/dL (ref 30.0–36.0)
MCV: 93.5 fL (ref 80.0–100.0)
Monocytes Absolute: 0.5 10*3/uL (ref 0.1–1.0)
Monocytes Relative: 12 %
Neutro Abs: 2.4 10*3/uL (ref 1.7–7.7)
Neutrophils Relative %: 60 %
Platelet Count: 87 10*3/uL — ABNORMAL LOW (ref 150–400)
RBC: 4.13 MIL/uL — ABNORMAL LOW (ref 4.22–5.81)
RDW: 13.9 % (ref 11.5–15.5)
WBC Count: 4 10*3/uL (ref 4.0–10.5)
nRBC: 0 % (ref 0.0–0.2)

## 2021-05-21 LAB — CMP (CANCER CENTER ONLY)
ALT: 23 U/L (ref 0–44)
AST: 17 U/L (ref 15–41)
Albumin: 1.8 g/dL — ABNORMAL LOW (ref 3.5–5.0)
Alkaline Phosphatase: 197 U/L — ABNORMAL HIGH (ref 38–126)
Anion gap: 11 (ref 5–15)
BUN: 19 mg/dL (ref 8–23)
CO2: 24 mmol/L (ref 22–32)
Calcium: 7.9 mg/dL — ABNORMAL LOW (ref 8.9–10.3)
Chloride: 103 mmol/L (ref 98–111)
Creatinine: 0.94 mg/dL (ref 0.61–1.24)
GFR, Estimated: >60 mL/min (ref >60)
Glucose, Bld: 171 mg/dL — ABNORMAL HIGH (ref 70–99)
Potassium: 3.7 mmol/L (ref 3.5–5.1)
Sodium: 138 mmol/L (ref 135–145)
Total Bilirubin: 0.4 mg/dL (ref 0.3–1.2)
Total Protein: 4.2 g/dL — ABNORMAL LOW (ref 6.5–8.1)

## 2021-05-21 LAB — MAGNESIUM: Magnesium: 1.6 mg/dL — ABNORMAL LOW (ref 1.7–2.4)

## 2021-05-21 MED ORDER — SODIUM CHLORIDE 0.9% FLUSH
10.0000 mL | INTRAVENOUS | Status: DC | PRN
  Administered 2021-05-21: 10 mL

## 2021-05-21 MED ORDER — HEPARIN SOD (PORK) LOCK FLUSH 100 UNIT/ML IV SOLN
500.0000 [IU] | Freq: Once | INTRAVENOUS | Status: AC | PRN
  Administered 2021-05-21: 500 [IU]

## 2021-05-21 NOTE — Progress Notes (Signed)
[place] [phone]   [phone]  OFFICE PROGRESS NOTE  Laurey Morale, MD [place] [address]  DIAGNOSIS: Stage IV (T2b, N2, M1c) presented with right middle lobe lung mass in addition to mediastinal lymphadenopathy as well as malignant right pleural effusion with pleural-based nodules and suspicious right hepatic lesion and the brain metastasis diagnosed in July 2020.  Molecular Biomarkers: [id number] (Exon 20 insertion) 0.2% Dacomitinib,Neratinib,Osimertinib  [id number] 0.1% None   PRIOR THERAPY:  1) Systemic chemotherapy with carboplatin for AUC of 5, Alimta 500 mg/M2 and Keytruda 200 mg IV every 3 weeks.  First dose April 04, 2019.  Status post 21 cycles  Starting from cycle #5 the patient is on maintenance treatment with Alimta and Keytruda every 3 weeks.  Starting from cycle #19 he will be on single agent Alimta.  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 2) whole brain irradiation for multiple metastatic brain lesions. 3)  Mobocertinib (Axkibity) 258 mg p.o. daily.  First dose was June 29, 2020.  Status post 6 months of treatment.  This was discontinued secondary to disease progression in the brain as well as the chest. 4) treatment was targeted therapy with Tagrisso 80 mg p.o. daily status post 7 weeks of treatment discontinued secondary to disease progression. 5) Amivantamab 350 Mg IV on day 1 followed by 1050 Mg IV on day 2 of cycle #1 started on March 12, 2021 status post 1 cycle.  Starting from cycle #2 his dose will be 1400 Mg IV weekly for 3 cycles followed by 1400 Mg IV every 2 weeks starting from cycle #5.  Status post 6 cycles.  This is discontinued secondary to disease progression.  CURRENT THERAPY: Systemic chemotherapy with docetaxel 75 Mg/M2 every 3 weeks with Neulasta support.  Status post 1 cycle started on 05/14/2021.  He did not receive Cyramza  because of the recent deep venous thrombosis and treatment with Eliquis.   INTERVAL HISTORY: Ethan Brown 70 y.o. male returns to the clinic today for follow-up visit accompanied by his wife.  The patient is feeling much better today with no concerning complaints except for the baseline shortness of breath and cough that is worse at nighttime.  He also has swelling of the left lower extremity with no improvement on Eliquis or Lasix.  He denied having any current chest pain or hemoptysis.  He has no nausea, vomiting, diarrhea or constipation.  He has no headache or visual changes.  He tolerated the first cycle of his treatment with docetaxel fairly well.  He is here today for evaluation and repeat blood work.  MEDICAL HISTORY: Past Medical History:  Diagnosis Date   Borderline hypertension    Brain metastasis ([place]) dx'd 01/2019   Detached vitreous humor, left    Dyspnea    ED (erectile dysfunction) of organic origin    Hyperlipidemia    Hypertension    Lens subluxation, left    Lung cancer ([place]) dx'd 01/2019   Stage IV   Migraine headache    takes PRN Imitrex   Pseudophakia    TGA (transient global amnesia) 2017    ALLERGIES:  has No Known Allergies.  MEDICATIONS:  Current Outpatient Medications  Medication Sig Dispense Refill   amivantamab-vmjw 1,050 mg in sodium chloride 0.9 % 250 mL Inject 1,050 mg into the vein once.     apixaban (ELIQUIS) 5 MG TABS tablet Take 1 tablet (5 mg total) by mouth  2 (two) times daily. 60 tablet 3   clindamycin (CLINDAGEL) 1 % gel Apply topically 2 (two) times daily. 30 g 0   cycloSPORINE (RESTASIS) 0.05 % ophthalmic emulsion Place 1 drop into both eyes 2 (two) times daily.     dexamethasone (DECADRON) 4 MG tablet 2 tablet p.o. twice daily the day before, day of and day after the chemotherapy every 3 weeks 40 tablet 1   doxycycline (VIBRAMYCIN) 100 MG capsule Take 1 capsule (100 mg total) by mouth 2 (two) times daily. 30 capsule 1   furosemide  (LASIX) 20 MG tablet Take 1 tablet (20 mg total) by mouth 2 (two) times daily. 60 tablet 0   hydrochlorothiazide (MICROZIDE) 12.5 MG capsule TAKE 1 CAPSULE BY MOUTH EVERY DAY (Patient taking differently: Take 12.5 mg by mouth every other day.) 90 capsule 3   HYDROcodone bit-homatropine (HYCODAN) 5-1.5 MG/5ML syrup Take 5 mLs by mouth every 6 (six) hours as needed for cough. 473 mL 0   hydrocortisone 1 % lotion Apply 1 application topically 2 (two) times daily. (Patient not taking: Reported on 05/07/2021) 113 g 0   lidocaine-prilocaine (EMLA) cream Apply to the Port-A-Cath site 30-60 minutes before treatment 30 g 0   LORazepam (ATIVAN) 1 MG tablet Take 1 tablet (1 mg total) by mouth as needed for anxiety (30 minutes prior to radiation treatment and may repeat just prior to procedure if needed.). (Patient not taking: No sig reported) 10 tablet 0   magic mouthwash SOLN Take 5 mLs by mouth 4 (four) times daily as needed for mouth pain. Swish and spit or swallow (Patient not taking: No sig reported) 240 mL 0   memantine (NAMENDA) 10 MG tablet TAKE 1 TABLET BY MOUTH TWICE A DAY (Patient taking differently: Take 10 mg by mouth 2 (two) times daily.) 180 tablet 2   mirtazapine (REMERON) 15 MG tablet Take 1 tablet (15 mg total) by mouth at bedtime. (Patient not taking: Reported on 05/07/2021) 30 tablet 2   mupirocin ointment (BACTROBAN) 2 % 2 (two) times daily as needed.     omeprazole (PRILOSEC) 20 MG capsule Take 20 mg by mouth daily.     prochlorperazine (COMPAZINE) 10 MG tablet Take 1 tablet (10 mg total) by mouth every 6 (six) hours as needed for nausea or vomiting. (Patient not taking: No sig reported) 30 tablet 1   sildenafil (REVATIO) 20 MG tablet TAKE AS DIRECTED (Patient not taking: No sig reported) 10 tablet 11   tamsulosin (FLOMAX) 0.4 MG CAPS capsule Take 0.4 mg by mouth daily.     No current facility-administered medications for this visit.   Facility-Administered Medications Ordered in Other  Visits  Medication Dose Route Frequency Provider Last Rate Last Admin   sodium chloride flush (NS) 0.9 % injection 10 mL  10 mL Intracatheter PRN Curt Bears, MD   10 mL at 05/21/21 6314    SURGICAL HISTORY:  Past Surgical History:  Procedure Laterality Date   CAROTID DOPPLERS  09/2017   Mild non-obstructive disease   CATARACT EXTRACTION, BILATERAL     cataract, left  2018   CHEST TUBE INSERTION Right 03/29/2019   Procedure: INSERTION PLEURAL DRAINAGE CATHETER;  Surgeon: [name], MD;  Location[facility] OR;  Service: Thoracic;  Laterality: Right;   COLONOSCOPY  2016   clear, repeat in 10 yrs    Eye surgeries     , Lens attachment.  Vitrectomy   FEMORAL HERNIA REPAIR     HERNIA REPAIR  IR THORACENTESIS ASP PLEURAL SPACE W/IMG GUIDE  02/22/2019   IR THORACENTESIS ASP PLEURAL SPACE W/IMG GUIDE  03/13/2019   IR THORACENTESIS ASP PLEURAL SPACE W/IMG GUIDE  03/18/2020   LUMBAR LAMINECTOMY     PLEURAL BIOPSY Right 03/29/2019   Procedure: PLEURAL BIOPSY;  Surgeon: [name], MD;  Location: [facility];  Service: Thoracic;  Laterality: Right;   PORTACATH PLACEMENT N/A 03/29/2019   Procedure: INSERTION PORT-A-CATH;  Surgeon: [name], MD;  Location: [facility];  Service: Thoracic;  Laterality: N/A;   retinal attachment, right     sclearl buckle, right     TRANSTHORACIC ECHOCARDIOGRAM  12/14/2019   EF 50 EF 55% (low normal).  Abnormal septal wall motion related to BBB; indeterminate diastolic parameters.  Mild RV enlargement with mildly reduced function.  Normal valves.  Normal atrial sizes.   victrectomy,right     VIDEO ASSISTED THORACOSCOPY Right 03/29/2019   Procedure: VIDEO ASSISTED THORACOSCOPY;  Surgeon: [name], MD;  Location: [facility];  Service: Thoracic;  Laterality: Right;    REVIEW OF SYSTEMS:  Constitutional: positive for fatigue Eyes: negative Ears, nose, mouth, throat, and face: negative Respiratory: positive for cough and dyspnea on  exertion Cardiovascular: negative Gastrointestinal: negative Genitourinary:negative Integument/breast: negative Hematologic/lymphatic: negative Musculoskeletal:positive for arthralgias and muscle weakness Neurological: positive for paresthesia and weakness Behavioral/Psych: negative Endocrine: negative Allergic/Immunologic: negative   PHYSICAL EXAMINATION: General appearance: alert, cooperative, fatigued, and no distress Head: Normocephalic, without obvious abnormality, atraumatic Neck: no adenopathy, no JVD, supple, symmetrical, trachea midline, and thyroid not enlarged, symmetric, no tenderness/mass/nodules Lymph nodes: Cervical, supraclavicular, and axillary nodes normal. Resp: diminished breath sounds LLL and RLL and dullness to percussion LLL and RLL Back: symmetric, no curvature. ROM normal. No CVA tenderness. Cardio: regular rate and rhythm, S1, S2 normal, no murmur, click, rub or gallop GI: soft, non-tender; bowel sounds normal; no masses,  no organomegaly Extremities: edema 1+ edema in the left lower extremity Neurologic: Alert and oriented X 3, normal strength and tone. Normal symmetric reflexes. Normal coordination and gait Skin exam: Petechial rash on the forehead and the face bilaterally.  ECOG PERFORMANCE STATUS: 1 - Symptomatic but completely ambulatory  Blood pressure 110/80, pulse (!) 104, temperature 98.4 F (36.9 C), temperature source Oral, resp. rate 17, weight 194 lb 1 oz (88 kg), SpO2 98 %.  LABORATORY DATA: Lab Results  Component Value Date   WBC 9.5 05/14/2021   HGB 14.4 05/14/2021   HCT 42.3 05/14/2021   MCV 93.6 05/14/2021   PLT 231 05/14/2021      Chemistry      Component Value Date/Time   NA 139 05/14/2021 1257   K 3.7 05/14/2021 1257   CL 106 05/14/2021 1257   CO2 24 05/14/2021 1257   BUN 31 (H) 05/14/2021 1257   CREATININE 1.00 05/14/2021 1257      Component Value Date/Time   CALCIUM 8.5 (L) 05/14/2021 1257   ALKPHOS 211 (H) 05/14/2021  1257   AST 21 05/14/2021 1257   ALT 29 05/14/2021 1257   BILITOT 0.4 05/14/2021 1257       RADIOGRAPHIC STUDIES: DG Chest 1 View  Result Date: 05/09/2021 CLINICAL DATA:  70 year old male with a history recent thoracentesis. Known non-small cell carcinoma EXAM: CHEST  1 VIEW COMPARISON:  CT 05/05/2021 FINDINGS: Cardiomediastinal silhouette unchanged. Right chest wall port catheter via subclavian approach. Opacity at the right lung base obscures the right hemidiaphragm and the right heart border. Mixed interstitial and airspace opacities of the right  mid and lower lung, similar to findings on recent chest CT. Blunting of left costophrenic angle with meniscus overlying the left hemidiaphragm. No pneumothorax. No left-sided airspace disease. IMPRESSION: No complicating features status post left-sided thoracentesis. Right-sided pulmonary/pleural disease better demonstrated on recent CT 05/05/2021 Electronically Signed   By: Corrie Mckusick D.O.   On: 05/09/2021 12:04   CT Chest W Contrast  Result Date: 05/05/2021 CLINICAL DATA:  Non-small cell lung cancer staging, chemotherapy and XRT complete, ongoing immunotherapy EXAM: CT CHEST, ABDOMEN, AND PELVIS WITH CONTRAST TECHNIQUE: Multidetector CT imaging of the chest, abdomen and pelvis was performed following the standard protocol during bolus administration of intravenous contrast. CONTRAST:  44m OMNIPAQUE IOHEXOL 350 MG/ML SOLN, additional oral enteric contrast COMPARISON:  02/27/2021 FINDINGS: CT CHEST FINDINGS Cardiovascular: Right chest port catheter. Aortic atherosclerosis. Normal heart size. No pericardial effusion. Mediastinum/Nodes: Unchanged appearance of prominent subcentimeter mediastinal lymph nodes and soft tissue thickening about the lower trachea and hila (series 2, image 25). Thyroid gland, trachea, and esophagus demonstrate no significant findings. Lungs/Pleura: Moderate left pleural effusion, increased compared to prior examination. Interval  increase in extensive heterogeneous airspace opacity, nodularity, and interlobular septal thickening throughout the right lung. Small, loculated appearing right pleural effusion with pleural thickening. Musculoskeletal: No chest wall mass. CT ABDOMEN PELVIS FINDINGS Hepatobiliary: No solid liver abnormality is seen. No gallstones, gallbladder wall thickening, or biliary dilatation. Pancreas: Unremarkable. No pancreatic ductal dilatation or surrounding inflammatory changes. Spleen: Normal in size without significant abnormality. Adrenals/Urinary Tract: Adrenal glands are unremarkable. Kidneys are normal, without renal calculi, solid lesion, or hydronephrosis. Thickening of the urinary bladder wall with small diverticula. Small calculi in the dependent bladder (series 2, image 113). Stomach/Bowel: Stomach is within normal limits. Appendix appears normal. No evidence of bowel wall thickening, distention, or inflammatory changes. Descending and sigmoid diverticulosis. Vascular/Lymphatic: No significant vascular findings are present. No enlarged abdominal or pelvic lymph nodes. Reproductive: Prostatomegaly. Other: No abdominal wall hernia or abnormality. No abdominopelvic ascites. Unchanged nonspecific fat stranding in the central small bowel mesentery (series 2, image 81). Musculoskeletal: Multiple new and enlarged sclerotic osseous metastases, for example of the L1 vertebral body and T10 vertebral body (series 6, image 117). IMPRESSION: 1. Interval increase in extensive heterogeneous airspace opacity, nodularity, and interlobular septal thickening throughout the right lung. Findings are consistent with worsened lymphangitic spread of disease. 2. Unchanged appearance of prominent subcentimeter mediastinal lymph nodes and soft tissue thickening about the lower trachea and hila. 3. Small, loculated appearing right pleural effusion with metastatic pleural thickening. 4. Moderate left pleural effusion, increased compared to  prior examination, presumed malignant however without pleural thickening or other direct evidence of pleural metastatic disease. 5. Multiple new and enlarged sclerotic osseous metastases, consistent with worsened, although treated osseous metastatic disease. 6. No evidence of organ metastatic disease within the abdomen or pelvis. 7. Prostatomegaly and thickening of the urinary bladder wall with small diverticula, likely due to chronic outlet obstruction. 8. Small calculi in the dependent bladder. Aortic Atherosclerosis (ICD10-I70.0). Electronically Signed   By: AEddie CandleM.D.   On: 05/05/2021 17:40   CT Abdomen Pelvis W Contrast  Result Date: 05/05/2021 CLINICAL DATA:  Non-small cell lung cancer staging, chemotherapy and XRT complete, ongoing immunotherapy EXAM: CT CHEST, ABDOMEN, AND PELVIS WITH CONTRAST TECHNIQUE: Multidetector CT imaging of the chest, abdomen and pelvis was performed following the standard protocol during bolus administration of intravenous contrast. CONTRAST:  854mOMNIPAQUE IOHEXOL 350 MG/ML SOLN, additional oral enteric contrast COMPARISON:  02/27/2021 FINDINGS: CT CHEST  FINDINGS Cardiovascular: Right chest port catheter. Aortic atherosclerosis. Normal heart size. No pericardial effusion. Mediastinum/Nodes: Unchanged appearance of prominent subcentimeter mediastinal lymph nodes and soft tissue thickening about the lower trachea and hila (series 2, image 25). Thyroid gland, trachea, and esophagus demonstrate no significant findings. Lungs/Pleura: Moderate left pleural effusion, increased compared to prior examination. Interval increase in extensive heterogeneous airspace opacity, nodularity, and interlobular septal thickening throughout the right lung. Small, loculated appearing right pleural effusion with pleural thickening. Musculoskeletal: No chest wall mass. CT ABDOMEN PELVIS FINDINGS Hepatobiliary: No solid liver abnormality is seen. No gallstones, gallbladder wall thickening, or  biliary dilatation. Pancreas: Unremarkable. No pancreatic ductal dilatation or surrounding inflammatory changes. Spleen: Normal in size without significant abnormality. Adrenals/Urinary Tract: Adrenal glands are unremarkable. Kidneys are normal, without renal calculi, solid lesion, or hydronephrosis. Thickening of the urinary bladder wall with small diverticula. Small calculi in the dependent bladder (series 2, image 113). Stomach/Bowel: Stomach is within normal limits. Appendix appears normal. No evidence of bowel wall thickening, distention, or inflammatory changes. Descending and sigmoid diverticulosis. Vascular/Lymphatic: No significant vascular findings are present. No enlarged abdominal or pelvic lymph nodes. Reproductive: Prostatomegaly. Other: No abdominal wall hernia or abnormality. No abdominopelvic ascites. Unchanged nonspecific fat stranding in the central small bowel mesentery (series 2, image 81). Musculoskeletal: Multiple new and enlarged sclerotic osseous metastases, for example of the L1 vertebral body and T10 vertebral body (series 6, image 117). IMPRESSION: 1. Interval increase in extensive heterogeneous airspace opacity, nodularity, and interlobular septal thickening throughout the right lung. Findings are consistent with worsened lymphangitic spread of disease. 2. Unchanged appearance of prominent subcentimeter mediastinal lymph nodes and soft tissue thickening about the lower trachea and hila. 3. Small, loculated appearing right pleural effusion with metastatic pleural thickening. 4. Moderate left pleural effusion, increased compared to prior examination, presumed malignant however without pleural thickening or other direct evidence of pleural metastatic disease. 5. Multiple new and enlarged sclerotic osseous metastases, consistent with worsened, although treated osseous metastatic disease. 6. No evidence of organ metastatic disease within the abdomen or pelvis. 7. Prostatomegaly and thickening  of the urinary bladder wall with small diverticula, likely due to chronic outlet obstruction. 8. Small calculi in the dependent bladder. Aortic Atherosclerosis (ICD10-I70.0). Electronically Signed   By: Eddie Candle M.D.   On: 05/05/2021 17:40   US Thoracentesis Asp Pleural space w/IMG guide  Result Date: 05/09/2021 INDICATION: Patient with history of stage IV lung adenocarcinoma, left pleural effusion. Request received for therapeutic left thoracentesis. EXAM: ULTRASOUND GUIDED THERAPEUTIC LEFT THORACENTESIS MEDICATIONS: None COMPLICATIONS: None immediate. PROCEDURE: An ultrasound guided thoracentesis was thoroughly discussed with the patient and questions answered. The benefits, risks, alternatives and complications were also discussed. The patient understands and wishes to proceed with the procedure. Written consent was obtained. Ultrasound was performed to localize and mark an adequate pocket of fluid in the left chest. The area was then prepped and draped in the normal sterile fashion. 1% Lidocaine was used for local anesthesia. Under ultrasound guidance a 6 Fr Safe-T-Centesis catheter was introduced. Thoracentesis was performed. The catheter was removed and a dressing applied. FINDINGS: A total of approximately 1 liter of amber fluid was removed. IMPRESSION: Successful ultrasound guided therapeutic left thoracentesis yielding 1 liter of pleural fluid. Read by: Rowe Robert, PA-C Electronically Signed   By: Albin Felling M.D.   On: 05/09/2021 12:13     ASSESSMENT AND PLAN: This is a very pleasant 70 years old white male recently diagnosed with a stage IV (T2b, N2,  M1C) non-small cell lung cancer, adenocarcinoma with positive EGFR mutation in exon 20 (resistant mutation) diagnosed in July 2020 and presented with extensive disease involving the right upper lobe as well as the right middle and lower lobe with extensive pleural based metastasis as well as mediastinal and hilar disease with malignant pleural  effusion as well as liver and brain metastasis. Unfortunately the patient has no actionable mutations based on the molecular studies by [employer].   The patient is currently undergoing systemic chemotherapy with carboplatin for AUC of 5, Alimta 500 mg/M2 and Keytruda 200 mg IV every 3 weeks status post 21 cycles.  Starting from cycle #5 the patient is on maintenance treatment with Alimta and Keytruda. Beryle Flock was discontinued after cycle #18 secondary to recurrent immunotherapy mediated pneumonitis. Starting from cycle #19 the patient will be on treatment with single agent Alimta every 3 weeks. The patient has been tolerating the treatment well. He had repeat PET scan and MRI of the brain performed recently.  The PET scan showed evidence for disease progression with metastatic disease involving the thoracic and abdominal lymph nodes as well as the right hemithorax and bones.  He also has loculated moderate right pleural effusion and small left pleural effusion.  The PET scan also showed prostate enlargement and his PSA has been rising recently.  He was seen by urology. MRI of the brain showed development of new 5 mm right temporal lesion suspicious for metastatic disease as well as new 2 mm focus of enhancement in the left periventricular white matter also suspicious for metastatic disease.  He was seen by radiation oncology and expected to have [place] to this lesion soon. The patient started treatment with Mobocertinib (Axkibity) 678 mg p.o. daily.  Status post 6 months of treatment.  He has been tolerating the treatment well with no concerning adverse effects except for the dryness of his nose that is not responding to the over-the-counter medication.  His treatment was discontinued secondary to disease progression. Unfortunately he had significant disease progression in the brain with innumerable brain metastasis in addition to previous increase in some of the pulmonary nodules seen on the previous  imaging studies.  He completed a course of whole brain irradiation. The patient was treated with Tagrisso 80 mg p.o. daily status post 7 weeks of treatment discontinued secondary to disease progression. The patient started last week the first dose of his treatment with Amivantamab with reduced dose of 350 Mg IV on day 1 and 1050 Mg IV on day 2.  He has hypersensitivity reaction on day 1 required a lot of premedication and treatment with Solu-Medrol, Pepcid and Benadryl as well as Demerol.  He started tolerating his treatment fairly well with no concerning adverse effects starting from cycle #2. He is status post 6 cycles.   The patient tolerated the last cycle of his treatment fairly well. He had repeat CT scan of the chest, abdomen pelvis performed recently.  I personally and independently reviewed the scan images and discussed results with the patient today. Unfortunately his scan showed evidence for disease progression with increase in bilateral pleural effusion as well as the lymphangitic spread of the tumor in the lung.  He also has worsening bone metastasis. He is currently undergoing systemic chemotherapy with docetaxel 75 Mg/M2 with Neulasta support every 3 weeks.  Status post 1 cycle started on 05/14/2021. The patient tolerated the first cycle of his treatment well with no concerning adverse effects except for fatigue and mild peripheral neuropathy. I  recommended for him to proceed with cycle #2 in 2 weeks as planned. For the skin rash is currently on treatment with doxycycline 100 mg p.o. twice daily with some improvement.  He is also scheduled to see a dermatologist for evaluation. For the recurrent pleural effusion, he underwent ultrasound-guided left thoracentesis and we will continue to monitor him closely for any additional need of thoracentesis depending on his symptoms. For the left lower extremity deep venous thrombosis, he will continue his treatment with Eliquis. The patient will  come back for follow-up visit in 2 weeks for evaluation before starting cycle #2. He was advised to call immediately if he has any other concerning symptoms in the interval. The total time spent in the appointment was 30 minutes.    The patient voices understanding of current disease status and treatment options and is in agreement with the current care plan. All questions were answered. The patient knows to call the clinic with any problems, questions or concerns. We can certainly see the patient much sooner if necessary.  Disclaimer: This note was dictated with voice recognition software. Similar sounding words can inadvertently be transcribed and may not be corrected upon review.

## 2021-05-22 ENCOUNTER — Other Ambulatory Visit: Payer: Self-pay

## 2021-05-22 ENCOUNTER — Encounter: Payer: Self-pay | Admitting: Internal Medicine

## 2021-05-22 DIAGNOSIS — R600 Localized edema: Secondary | ICD-10-CM

## 2021-05-28 ENCOUNTER — Inpatient Hospital Stay: Payer: Medicare Other | Attending: Internal Medicine

## 2021-05-28 ENCOUNTER — Other Ambulatory Visit: Payer: Self-pay

## 2021-05-28 DIAGNOSIS — Z9221 Personal history of antineoplastic chemotherapy: Secondary | ICD-10-CM | POA: Diagnosis not present

## 2021-05-28 DIAGNOSIS — C342 Malignant neoplasm of middle lobe, bronchus or lung: Secondary | ICD-10-CM | POA: Diagnosis not present

## 2021-05-28 DIAGNOSIS — Z7901 Long term (current) use of anticoagulants: Secondary | ICD-10-CM | POA: Insufficient documentation

## 2021-05-28 DIAGNOSIS — Z95828 Presence of other vascular implants and grafts: Secondary | ICD-10-CM

## 2021-05-28 DIAGNOSIS — C7931 Secondary malignant neoplasm of brain: Secondary | ICD-10-CM | POA: Insufficient documentation

## 2021-05-28 DIAGNOSIS — Z5112 Encounter for antineoplastic immunotherapy: Secondary | ICD-10-CM | POA: Insufficient documentation

## 2021-05-28 DIAGNOSIS — Z86718 Personal history of other venous thrombosis and embolism: Secondary | ICD-10-CM | POA: Diagnosis not present

## 2021-05-28 DIAGNOSIS — Z79899 Other long term (current) drug therapy: Secondary | ICD-10-CM | POA: Diagnosis not present

## 2021-05-28 DIAGNOSIS — C3491 Malignant neoplasm of unspecified part of right bronchus or lung: Secondary | ICD-10-CM

## 2021-05-28 LAB — CMP (CANCER CENTER ONLY)
ALT: 21 U/L (ref 0–44)
AST: 21 U/L (ref 15–41)
Albumin: 1.7 g/dL — ABNORMAL LOW (ref 3.5–5.0)
Alkaline Phosphatase: 191 U/L — ABNORMAL HIGH (ref 38–126)
Anion gap: 7 (ref 5–15)
BUN: 24 mg/dL — ABNORMAL HIGH (ref 8–23)
CO2: 28 mmol/L (ref 22–32)
Calcium: 7.6 mg/dL — ABNORMAL LOW (ref 8.9–10.3)
Chloride: 105 mmol/L (ref 98–111)
Creatinine: 0.99 mg/dL (ref 0.61–1.24)
GFR, Estimated: >60 mL/min (ref >60)
Glucose, Bld: 106 mg/dL — ABNORMAL HIGH (ref 70–99)
Potassium: 3.4 mmol/L — ABNORMAL LOW (ref 3.5–5.1)
Sodium: 140 mmol/L (ref 135–145)
Total Bilirubin: 0.4 mg/dL (ref 0.3–1.2)
Total Protein: 4.2 g/dL — ABNORMAL LOW (ref 6.5–8.1)

## 2021-05-28 LAB — CBC WITH DIFFERENTIAL (CANCER CENTER ONLY)
Abs Immature Granulocytes: 0.08 10*3/uL — ABNORMAL HIGH (ref 0.00–0.07)
Basophils Absolute: 0 10*3/uL (ref 0.0–0.1)
Basophils Relative: 0 %
Eosinophils Absolute: 0 10*3/uL (ref 0.0–0.5)
Eosinophils Relative: 0 %
HCT: 38.5 % — ABNORMAL LOW (ref 39.0–52.0)
Hemoglobin: 12.9 g/dL — ABNORMAL LOW (ref 13.0–17.0)
Immature Granulocytes: 1 %
Lymphocytes Relative: 11 %
Lymphs Abs: 1.2 10*3/uL (ref 0.7–4.0)
MCH: 31.5 pg (ref 26.0–34.0)
MCHC: 33.5 g/dL (ref 30.0–36.0)
MCV: 94.1 fL (ref 80.0–100.0)
Monocytes Absolute: 0.5 10*3/uL (ref 0.1–1.0)
Monocytes Relative: 4 %
Neutro Abs: 9.4 10*3/uL — ABNORMAL HIGH (ref 1.7–7.7)
Neutrophils Relative %: 84 %
Platelet Count: 147 10*3/uL — ABNORMAL LOW (ref 150–400)
RBC: 4.09 MIL/uL — ABNORMAL LOW (ref 4.22–5.81)
RDW: 14.7 % (ref 11.5–15.5)
WBC Count: 11.2 10*3/uL — ABNORMAL HIGH (ref 4.0–10.5)
nRBC: 0 % (ref 0.0–0.2)

## 2021-05-28 MED ORDER — SODIUM CHLORIDE 0.9% FLUSH
10.0000 mL | INTRAVENOUS | Status: DC | PRN
Start: 2021-05-28 — End: 2021-05-28
  Administered 2021-05-28: 10 mL

## 2021-05-28 MED ORDER — HEPARIN SOD (PORK) LOCK FLUSH 100 UNIT/ML IV SOLN
500.0000 [IU] | Freq: Once | INTRAVENOUS | Status: AC | PRN
  Administered 2021-05-28: 500 [IU]

## 2021-05-29 ENCOUNTER — Other Ambulatory Visit: Payer: Self-pay | Admitting: Internal Medicine

## 2021-05-29 DIAGNOSIS — I824Z2 Acute embolism and thrombosis of unspecified deep veins of left distal lower extremity: Secondary | ICD-10-CM

## 2021-06-03 ENCOUNTER — Other Ambulatory Visit: Payer: Self-pay | Admitting: *Deleted

## 2021-06-03 ENCOUNTER — Other Ambulatory Visit: Payer: Self-pay | Admitting: Internal Medicine

## 2021-06-03 DIAGNOSIS — I824Z2 Acute embolism and thrombosis of unspecified deep veins of left distal lower extremity: Secondary | ICD-10-CM

## 2021-06-03 DIAGNOSIS — I824Y9 Acute embolism and thrombosis of unspecified deep veins of unspecified proximal lower extremity: Secondary | ICD-10-CM

## 2021-06-04 ENCOUNTER — Inpatient Hospital Stay: Payer: Medicare Other

## 2021-06-04 ENCOUNTER — Other Ambulatory Visit: Payer: Self-pay

## 2021-06-04 ENCOUNTER — Inpatient Hospital Stay (HOSPITAL_BASED_OUTPATIENT_CLINIC_OR_DEPARTMENT_OTHER): Payer: Medicare Other | Admitting: Internal Medicine

## 2021-06-04 ENCOUNTER — Encounter: Payer: Self-pay | Admitting: Internal Medicine

## 2021-06-04 ENCOUNTER — Inpatient Hospital Stay: Payer: Medicare Other | Admitting: Dietician

## 2021-06-04 VITALS — BP 132/83 | HR 87 | Temp 97.3°F | Resp 20 | Ht 71.0 in | Wt 199.6 lb

## 2021-06-04 DIAGNOSIS — M899 Disorder of bone, unspecified: Secondary | ICD-10-CM | POA: Diagnosis not present

## 2021-06-04 DIAGNOSIS — C342 Malignant neoplasm of middle lobe, bronchus or lung: Secondary | ICD-10-CM | POA: Diagnosis not present

## 2021-06-04 DIAGNOSIS — Z5112 Encounter for antineoplastic immunotherapy: Secondary | ICD-10-CM | POA: Diagnosis not present

## 2021-06-04 DIAGNOSIS — C7951 Secondary malignant neoplasm of bone: Secondary | ICD-10-CM

## 2021-06-04 DIAGNOSIS — K909 Intestinal malabsorption, unspecified: Secondary | ICD-10-CM | POA: Diagnosis not present

## 2021-06-04 DIAGNOSIS — Z7901 Long term (current) use of anticoagulants: Secondary | ICD-10-CM | POA: Diagnosis not present

## 2021-06-04 DIAGNOSIS — Z5111 Encounter for antineoplastic chemotherapy: Secondary | ICD-10-CM

## 2021-06-04 DIAGNOSIS — C7931 Secondary malignant neoplasm of brain: Secondary | ICD-10-CM | POA: Diagnosis not present

## 2021-06-04 DIAGNOSIS — C3491 Malignant neoplasm of unspecified part of right bronchus or lung: Secondary | ICD-10-CM

## 2021-06-04 DIAGNOSIS — Z95828 Presence of other vascular implants and grafts: Secondary | ICD-10-CM

## 2021-06-04 DIAGNOSIS — Z9221 Personal history of antineoplastic chemotherapy: Secondary | ICD-10-CM | POA: Diagnosis not present

## 2021-06-04 DIAGNOSIS — Z86718 Personal history of other venous thrombosis and embolism: Secondary | ICD-10-CM | POA: Diagnosis not present

## 2021-06-04 LAB — CBC WITH DIFFERENTIAL (CANCER CENTER ONLY)
Abs Immature Granulocytes: 0.13 10*3/uL — ABNORMAL HIGH (ref 0.00–0.07)
Basophils Absolute: 0 10*3/uL (ref 0.0–0.1)
Basophils Relative: 0 %
Eosinophils Absolute: 0 10*3/uL (ref 0.0–0.5)
Eosinophils Relative: 0 %
HCT: 40.1 % (ref 39.0–52.0)
Hemoglobin: 13.6 g/dL (ref 13.0–17.0)
Immature Granulocytes: 1 %
Lymphocytes Relative: 6 %
Lymphs Abs: 1 10*3/uL (ref 0.7–4.0)
MCH: 31.7 pg (ref 26.0–34.0)
MCHC: 33.9 g/dL (ref 30.0–36.0)
MCV: 93.5 fL (ref 80.0–100.0)
Monocytes Absolute: 0.4 10*3/uL (ref 0.1–1.0)
Monocytes Relative: 2 %
Neutro Abs: 14.8 10*3/uL — ABNORMAL HIGH (ref 1.7–7.7)
Neutrophils Relative %: 91 %
Platelet Count: 366 10*3/uL (ref 150–400)
RBC: 4.29 MIL/uL (ref 4.22–5.81)
RDW: 14.6 % (ref 11.5–15.5)
WBC Count: 16.2 10*3/uL — ABNORMAL HIGH (ref 4.0–10.5)
nRBC: 0 % (ref 0.0–0.2)

## 2021-06-04 LAB — CMP (CANCER CENTER ONLY)
ALT: 27 U/L (ref 0–44)
AST: 26 U/L (ref 15–41)
Albumin: 2 g/dL — ABNORMAL LOW (ref 3.5–5.0)
Alkaline Phosphatase: 160 U/L — ABNORMAL HIGH (ref 38–126)
Anion gap: 6 (ref 5–15)
BUN: 30 mg/dL — ABNORMAL HIGH (ref 8–23)
CO2: 29 mmol/L (ref 22–32)
Calcium: 8.4 mg/dL — ABNORMAL LOW (ref 8.9–10.3)
Chloride: 106 mmol/L (ref 98–111)
Creatinine: 1.15 mg/dL (ref 0.61–1.24)
GFR, Estimated: >60 mL/min (ref >60)
Glucose, Bld: 154 mg/dL — ABNORMAL HIGH (ref 70–99)
Potassium: 3.3 mmol/L — ABNORMAL LOW (ref 3.5–5.1)
Sodium: 141 mmol/L (ref 135–145)
Total Bilirubin: 0.4 mg/dL (ref 0.3–1.2)
Total Protein: 4.9 g/dL — ABNORMAL LOW (ref 6.5–8.1)

## 2021-06-04 LAB — MAGNESIUM: Magnesium: 1.9 mg/dL (ref 1.7–2.4)

## 2021-06-04 MED ORDER — SODIUM CHLORIDE 0.9 % IV SOLN
75.0000 mg/m2 | Freq: Once | INTRAVENOUS | Status: AC
  Administered 2021-06-04: 150 mg via INTRAVENOUS
  Filled 2021-06-04: qty 15

## 2021-06-04 MED ORDER — SODIUM CHLORIDE 0.9 % IV SOLN
10.0000 mg | Freq: Once | INTRAVENOUS | Status: AC
  Administered 2021-06-04: 10 mg via INTRAVENOUS
  Filled 2021-06-04: qty 10

## 2021-06-04 MED ORDER — SODIUM CHLORIDE 0.9% FLUSH
10.0000 mL | INTRAVENOUS | Status: DC | PRN
  Administered 2021-06-04: 10 mL

## 2021-06-04 MED ORDER — SODIUM CHLORIDE 0.9 % IV SOLN: Freq: Once | INTRAVENOUS | Status: AC

## 2021-06-04 MED ORDER — HEPARIN SOD (PORK) LOCK FLUSH 100 UNIT/ML IV SOLN
500.0000 [IU] | Freq: Once | INTRAVENOUS | Status: AC | PRN
  Administered 2021-06-04: 500 [IU]

## 2021-06-04 NOTE — Patient Instructions (Addendum)
Hypokalemia Hypokalemia means that the amount of potassium in the blood is lower than normal. Potassium is a chemical (electrolyte) that helps regulate the amount of fluid in the body. It also stimulates muscle tightening (contraction) and helps nerves work properly. Normally, most of the body's potassium is inside cells, and only a very small amount is in the blood. Because the amount in the blood is so small, minor changes to potassium levels in the blood can be life-threatening. What are the causes? This condition may be caused by: Antibiotic medicine. Diarrhea or vomiting. Taking too much of a medicine that helps you have a bowel movement (laxative) can cause diarrhea and lead to hypokalemia. Chronic kidney disease (CKD). Medicines that help the body get rid of excess fluid (diuretics). Eating disorders, such as bulimia. Low magnesium levels in the body. Sweating a lot. What are the signs or symptoms? Symptoms of this condition include: Weakness. Constipation. Fatigue. Muscle cramps. Mental confusion. Skipped heartbeats or irregular heartbeat (palpitations). Tingling or numbness. How is this diagnosed? This condition is diagnosed with a blood test. How is this treated? This condition may be treated by: Taking potassium supplements by mouth. Adjusting the medicines that you take. Eating more foods that contain a lot of potassium. If your potassium level is very low, you may need to get potassium through an IV and be monitored in the hospital. Follow these instructions at home:  Take over-the-counter and prescription medicines only as told by your health care provider. This includes vitamins and supplements. Eat a healthy diet. A healthy diet includes fresh fruits and vegetables, whole grains, healthy fats, and lean proteins. If instructed, eat more foods that contain a lot of potassium. This includes: Nuts, such as peanuts and pistachios. Seeds, such as sunflower seeds and  pumpkin seeds. Peas, lentils, and lima beans. Whole grain and bran cereals and breads. Fresh fruits and vegetables, such as apricots, avocado, bananas, cantaloupe, kiwi, oranges, tomatoes, asparagus, and potatoes. Orange juice. Tomato juice. Red meats. Yogurt. Keep all follow-up visits as told by your health care provider. This is important. Contact a health care provider if you: Have weakness that gets worse. Feel your heart pounding or racing. Vomit. Have diarrhea. Have diabetes (diabetes mellitus) and you have trouble keeping your blood sugar (glucose) in your target range. Get help right away if you: Have chest pain. Have shortness of breath. Have vomiting or diarrhea that lasts for more than 2 days. Faint. Summary Hypokalemia means that the amount of potassium in the blood is lower than normal. This condition is diagnosed with a blood test. Hypokalemia may be treated by taking potassium supplements, adjusting the medicines that you take, or eating more foods that are high in potassium. If your potassium level is very low, you may need to get potassium through an IV and be monitored in the hospital. This information is not intended to replace advice given to you by your health care provider. Make sure you discuss any questions you have with your health care provider. Document Revised: 03/22/2018 Document Reviewed: 03/23/2018 Elsevier Patient Education  2022 [facility] [employer]  Discharge Instructions: Thank you for choosing [facility] to provide your oncology and hematology care.   If you have a lab appointment with the [facility], please go directly to the [facility] and check in at the registration area.   Wear comfortable clothing and clothing appropriate for easy access to any Portacath or PICC line.  We strive to give you quality time with your provider. You may need to reschedule your appointment if you  arrive late (15 or more minutes).  Arriving late affects you and other patients whose appointments are after yours.  Also, if you miss three or more appointments without notifying the office, you may be dismissed from the clinic at the provider's discretion.      For prescription refill requests, have your pharmacy contact our office and allow 72 hours for refills to be completed.    Today you received the following chemotherapy and/or immunotherapy agents docetaxel      To help prevent nausea and vomiting after your treatment, we encourage you to take your nausea medication as directed.  BELOW ARE SYMPTOMS THAT SHOULD BE REPORTED IMMEDIATELY: *FEVER GREATER THAN 100.4 F (38 C) OR HIGHER *CHILLS OR SWEATING *NAUSEA AND VOMITING THAT IS NOT CONTROLLED WITH YOUR NAUSEA MEDICATION *UNUSUAL SHORTNESS OF BREATH *UNUSUAL BRUISING OR BLEEDING *URINARY PROBLEMS (pain or burning when urinating, or frequent urination) *BOWEL PROBLEMS (unusual diarrhea, constipation, pain near the anus) TENDERNESS IN MOUTH AND THROAT WITH OR WITHOUT PRESENCE OF ULCERS (sore throat, sores in mouth, or a toothache) UNUSUAL RASH, SWELLING OR PAIN  UNUSUAL VAGINAL DISCHARGE OR ITCHING   Items with * indicate a potential emergency and should be followed up as soon as possible or go to the Emergency Department if any problems should occur.  Please show the CHEMOTHERAPY ALERT CARD or IMMUNOTHERAPY ALERT CARD at check-in to the Emergency Department and triage nurse.  Should you have questions after your visit or need to cancel or reschedule your appointment, please contact [facility]  Dept: [phone]  and follow the prompts.  Office hours are 8:00 a.m. to 4:30 p.m. Monday - Friday. Please note that voicemails left after 4:00 p.m. may not be returned until the following business day.  We are closed weekends and major holidays. You have access to a nurse at all times for urgent questions.  Please call the main number to the clinic Dept: [phone] and follow the prompts.   For any non-urgent questions, you may also contact your provider using MyChart. We now offer e-Visits for anyone 30 and older to request care online for non-urgent symptoms. For details visit [email or web address].   Also download the MyChart app! Go to the app store, search "MyChart", open the app, select [facility], and log in with your MyChart username and password.  Due to Covid, a mask is required upon entering the hospital/clinic. If you do not have a mask, one will be given to you upon arrival. For doctor visits, patients may have 1 support person aged 83 or older with them. For treatment visits, patients cannot have anyone with them due to current Covid guidelines and our immunocompromised population.

## 2021-06-04 NOTE — Progress Notes (Signed)
Nutrition Follow-up:  Patient currently receiving systemic chemotherapy with docetaxel with Neulasta support for metastatic lung cancer.   Received message from RN prior to visit requesting handout with potassium rich foods per PA-C. Met with patient during infusion. He reports overall feeling well, states lower extremity pain has improved and feels blood clot is resolved. He continues to take Eliquis as well as Lasix. Patient reports ongoing BLE swelling which he strongly feels is lymphedema. Patient also noting swelling in both hands. Patient reports he is scheduled for doppler tomorrow. Patient reports his appetite his good, states he can always eat. Patient expresses concern about decreased albumin levels. Patient happy to learn albumin increased 1.7>2.0. He is eating salmon, beef, mixed vegetables, beans. Patient has not been consistently drinking CIB supplements, maybe one every few days.   Medications: reviewed   Labs: K 3.3, Glucose 154, BUN 30, Albumin 2.0  Anthropometrics: Weight 199 lb 9.6 oz today increased from 194 lb 1 oz on 9/28 and 185 lb 11.2 oz on 9/14. (Suspect weight increase r/t fluid)   NUTRITION DIAGNOSIS: Unintentional weight loss stable   INTERVENTION:  Continue eating small frequent meals and snacks with adequate calories and protein Encouraged drinking CIB daily and suggested blending with banana and ice for tasty shake with added potassium Discussed foods with potassium - pt provided handout with list  Contact information provided    MONITORING, EVALUATION, GOAL:  Weight trends, intake  NEXT VISIT: Wednesday November 23 during infusion

## 2021-06-04 NOTE — Progress Notes (Signed)
[facility] Telephone:([phone]) [phone]   Fax:([phone]  OFFICE PROGRESS NOTE  Laurey Morale, MD [place] [address]  DIAGNOSIS: Stage IV (T2b, N2, M1c) presented with right middle lobe lung mass in addition to mediastinal lymphadenopathy as well as malignant right pleural effusion with pleural-based nodules and suspicious right hepatic lesion and the brain metastasis diagnosed in July 2020.  Molecular Biomarkers: [id number] (Exon 20 insertion) 0.2% Dacomitinib,Neratinib,Osimertinib  [id number] 0.1% None   PRIOR THERAPY:  1) Systemic chemotherapy with carboplatin for AUC of 5, Alimta 500 mg/M2 and Keytruda 200 mg IV every 3 weeks.  First dose April 04, 2019.  Status post 21 cycles  Starting from cycle #5 the patient is on maintenance treatment with Alimta and Keytruda every 3 weeks.  Starting from cycle #19 he will be on single agent Alimta.  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 2) whole brain irradiation for multiple metastatic brain lesions. 3)  Mobocertinib (Axkibity) 097 mg p.o. daily.  First dose was June 29, 2020.  Status post 6 months of treatment.  This was discontinued secondary to disease progression in the brain as well as the chest. 4) treatment was targeted therapy with Tagrisso 80 mg p.o. daily status post 7 weeks of treatment discontinued secondary to disease progression. 5) Amivantamab 350 Mg IV on day 1 followed by 1050 Mg IV on day 2 of cycle #1 started on March 12, 2021 status post 1 cycle.  Starting from cycle #2 his dose will be 1400 Mg IV weekly for 3 cycles followed by 1400 Mg IV every 2 weeks starting from cycle #5.  Status post 6 cycles.  This is discontinued secondary to disease progression.  CURRENT THERAPY: Systemic chemotherapy with docetaxel 75 Mg/M2 every 3 weeks with Neulasta support.  Status post 1 cycle started on 05/14/2021.  He did not receive Cyramza  because of the recent deep venous thrombosis and treatment with Eliquis.   INTERVAL HISTORY: Ethan Brown 70 y.o. male returns to the clinic today for follow-up visit accompanied by his wife.  The patient is feeling fine today with no concerning complaints except for mild fatigue.  He also continues to have dry cough but he is feeling much better these days.  He denied having any significant chest pain but has shortness of breath with exertion with no hemoptysis.  He denied having any recent weight loss or night sweats.  He has no headache or visual changes.  He continues to have swelling of the lower extremities left more than right.  He was supposed to have a Doppler of the lower extremity few days ago but this was not scheduled.  The patient is here today for evaluation before starting cycle #2 of his treatment.  He was seen by dermatology recently for the scalp lesion and he was advised to keep it moist all the time and to avoid any dryness.   MEDICAL HISTORY: Past Medical History:  Diagnosis Date   Borderline hypertension    Brain metastasis ([facility]) dx'd 01/2019   Detached vitreous humor, left    Dyspnea    ED (erectile dysfunction) of organic origin    Hyperlipidemia    Hypertension    Lens subluxation, left    Lung cancer ([facility]) dx'd 01/2019   Stage IV   Migraine headache    takes PRN Imitrex   Pseudophakia    TGA (transient global amnesia) 2017    ALLERGIES:  has  No Known Allergies.  MEDICATIONS:  Current Outpatient Medications  Medication Sig Dispense Refill   amivantamab-vmjw 1,050 mg in sodium chloride 0.9 % 250 mL Inject 1,050 mg into the vein once.     apixaban (ELIQUIS) 5 MG TABS tablet Take 1 tablet (5 mg total) by mouth 2 (two) times daily. 60 tablet 3   clindamycin (CLINDAGEL) 1 % gel APPLY TOPICALLY TWICE A DAY 30 g 0   cycloSPORINE (RESTASIS) 0.05 % ophthalmic emulsion Place 1 drop into both eyes 2 (two) times daily.     dexamethasone (DECADRON) 4 MG tablet 2  tablet p.o. twice daily the day before, day of and day after the chemotherapy every 3 weeks 40 tablet 1   doxycycline (VIBRAMYCIN) 100 MG capsule Take 1 capsule (100 mg total) by mouth 2 (two) times daily. 30 capsule 1   furosemide (LASIX) 20 MG tablet Take 1 tablet (20 mg total) by mouth 2 (two) times daily. 60 tablet 0   hydrochlorothiazide (MICROZIDE) 12.5 MG capsule TAKE 1 CAPSULE BY MOUTH EVERY DAY (Patient taking differently: Take 12.5 mg by mouth every other day.) 90 capsule 3   HYDROcodone bit-homatropine (HYCODAN) 5-1.5 MG/5ML syrup Take 5 mLs by mouth every 6 (six) hours as needed for cough. 473 mL 0   hydrocortisone 1 % lotion Apply 1 application topically 2 (two) times daily. (Patient not taking: Reported on 05/07/2021) 113 g 0   lidocaine-prilocaine (EMLA) cream Apply to the Port-A-Cath site 30-60 minutes before treatment 30 g 0   LORazepam (ATIVAN) 1 MG tablet Take 1 tablet (1 mg total) by mouth as needed for anxiety (30 minutes prior to radiation treatment and may repeat just prior to procedure if needed.). (Patient not taking: No sig reported) 10 tablet 0   magic mouthwash SOLN Take 5 mLs by mouth 4 (four) times daily as needed for mouth pain. Swish and spit or swallow (Patient not taking: No sig reported) 240 mL 0   memantine (NAMENDA) 10 MG tablet TAKE 1 TABLET BY MOUTH TWICE A DAY (Patient taking differently: Take 10 mg by mouth 2 (two) times daily.) 180 tablet 2   mirtazapine (REMERON) 15 MG tablet Take 1 tablet (15 mg total) by mouth at bedtime. (Patient not taking: Reported on 05/07/2021) 30 tablet 2   mupirocin ointment (BACTROBAN) 2 % 2 (two) times daily as needed.     omeprazole (PRILOSEC) 20 MG capsule Take 20 mg by mouth daily.     prochlorperazine (COMPAZINE) 10 MG tablet Take 1 tablet (10 mg total) by mouth every 6 (six) hours as needed for nausea or vomiting. (Patient not taking: No sig reported) 30 tablet 1   sildenafil (REVATIO) 20 MG tablet TAKE AS DIRECTED (Patient not  taking: No sig reported) 10 tablet 11   tamsulosin (FLOMAX) 0.4 MG CAPS capsule Take 0.4 mg by mouth daily.     No current facility-administered medications for this visit.    SURGICAL HISTORY:  Past Surgical History:  Procedure Laterality Date   CAROTID DOPPLERS  09/2017   Mild non-obstructive disease   CATARACT EXTRACTION, BILATERAL     cataract, left  2018   CHEST TUBE INSERTION Right 03/29/2019   Procedure: INSERTION PLEURAL DRAINAGE CATHETER;  Surgeon: [name], MD;  Location: [facility] OR;  Service: Thoracic;  Laterality: Right;   COLONOSCOPY  2016   clear, repeat in 10 yrs    Eye surgeries     , Lens attachment.  Vitrectomy   FEMORAL HERNIA REPAIR  HERNIA REPAIR     IR THORACENTESIS ASP PLEURAL SPACE W/IMG GUIDE  02/22/2019   IR THORACENTESIS ASP PLEURAL SPACE W/IMG GUIDE  03/13/2019   IR THORACENTESIS ASP PLEURAL SPACE W/IMG GUIDE  03/18/2020   LUMBAR LAMINECTOMY     PLEURAL BIOPSY Right 03/29/2019   Procedure: PLEURAL BIOPSY;  Surgeon: [name], MD;  Location: [facility];  Service: Thoracic;  Laterality: Right;   PORTACATH PLACEMENT N/A 03/29/2019   Procedure: INSERTION PORT-A-CATH;  Surgeon: [name], MD;  Location: [facility];  Service: Thoracic;  Laterality: N/A;   retinal attachment, right     sclearl buckle, right     TRANSTHORACIC ECHOCARDIOGRAM  12/14/2019   EF 50 EF 55% (low normal).  Abnormal septal wall motion related to BBB; indeterminate diastolic parameters.  Mild RV enlargement with mildly reduced function.  Normal valves.  Normal atrial sizes.   victrectomy,right     VIDEO ASSISTED THORACOSCOPY Right 03/29/2019   Procedure: VIDEO ASSISTED THORACOSCOPY;  Surgeon: [name], MD;  Location: [facility];  Service: Thoracic;  Laterality: Right;    REVIEW OF SYSTEMS:  Constitutional: positive for fatigue Eyes: negative Ears, nose, mouth, throat, and face: negative Respiratory: positive for cough and dyspnea on  exertion Cardiovascular: negative Gastrointestinal: negative Genitourinary:negative Integument/breast: negative Hematologic/lymphatic: negative Musculoskeletal:positive for arthralgias Neurological: positive for paresthesia and weakness Behavioral/Psych: negative Endocrine: negative Allergic/Immunologic: negative   PHYSICAL EXAMINATION: General appearance: alert, cooperative, fatigued, and no distress Head: Normocephalic, without obvious abnormality, atraumatic Neck: no adenopathy, no JVD, supple, symmetrical, trachea midline, and thyroid not enlarged, symmetric, no tenderness/mass/nodules Lymph nodes: Cervical, supraclavicular, and axillary nodes normal. Resp: diminished breath sounds LLL and RLL and dullness to percussion LLL and RLL Back: symmetric, no curvature. ROM normal. No CVA tenderness. Cardio: regular rate and rhythm, S1, S2 normal, no murmur, click, rub or gallop GI: soft, non-tender; bowel sounds normal; no masses,  no organomegaly Extremities: edema 1+ edema in the left lower extremity Neurologic: Alert and oriented X 3, normal strength and tone. Normal symmetric reflexes. Normal coordination and gait Skin exam: Petechial rash on the forehead and the face bilaterally.  ECOG PERFORMANCE STATUS: 1 - Symptomatic but completely ambulatory  Blood pressure 132/83, pulse 87, temperature (!) 97.3 F (36.3 C), temperature source Tympanic, resp. rate 20, height 5' 11" (1.803 m), weight 199 lb 9.6 oz (90.5 kg), SpO2 98 %.  LABORATORY DATA: Lab Results  Component Value Date   WBC 16.2 (H) 06/04/2021   HGB 13.6 06/04/2021   HCT 40.1 06/04/2021   MCV 93.5 06/04/2021   PLT 366 06/04/2021      Chemistry      Component Value Date/Time   NA 140 05/28/2021 1111   K 3.4 (L) 05/28/2021 1111   CL 105 05/28/2021 1111   CO2 28 05/28/2021 1111   BUN 24 (H) 05/28/2021 1111   CREATININE 0.99 05/28/2021 1111      Component Value Date/Time   CALCIUM 7.6 (L) 05/28/2021 1111    ALKPHOS 191 (H) 05/28/2021 1111   AST 21 05/28/2021 1111   ALT 21 05/28/2021 1111   BILITOT 0.4 05/28/2021 1111       RADIOGRAPHIC STUDIES: DG Chest 1 View  Result Date: 05/09/2021 CLINICAL DATA:  70 year old male with a history recent thoracentesis. Known non-small cell carcinoma EXAM: CHEST  1 VIEW COMPARISON:  CT 05/05/2021 FINDINGS: Cardiomediastinal silhouette unchanged. Right chest wall port catheter via subclavian approach. Opacity at the right lung base obscures the right hemidiaphragm and the right  heart border. Mixed interstitial and airspace opacities of the right mid and lower lung, similar to findings on recent chest CT. Blunting of left costophrenic angle with meniscus overlying the left hemidiaphragm. No pneumothorax. No left-sided airspace disease. IMPRESSION: No complicating features status post left-sided thoracentesis. Right-sided pulmonary/pleural disease better demonstrated on recent CT 05/05/2021 Electronically Signed   By: Corrie Mckusick D.O.   On: 05/09/2021 12:04   US Thoracentesis Asp Pleural space w/IMG guide  Result Date: 05/09/2021 INDICATION: Patient with history of stage IV lung adenocarcinoma, left pleural effusion. Request received for therapeutic left thoracentesis. EXAM: ULTRASOUND GUIDED THERAPEUTIC LEFT THORACENTESIS MEDICATIONS: None COMPLICATIONS: None immediate. PROCEDURE: An ultrasound guided thoracentesis was thoroughly discussed with the patient and questions answered. The benefits, risks, alternatives and complications were also discussed. The patient understands and wishes to proceed with the procedure. Written consent was obtained. Ultrasound was performed to localize and mark an adequate pocket of fluid in the left chest. The area was then prepped and draped in the normal sterile fashion. 1% Lidocaine was used for local anesthesia. Under ultrasound guidance a 6 Fr Safe-T-Centesis catheter was introduced. Thoracentesis was performed. The catheter was  removed and a dressing applied. FINDINGS: A total of approximately 1 liter of amber fluid was removed. IMPRESSION: Successful ultrasound guided therapeutic left thoracentesis yielding 1 liter of pleural fluid. Read by: Rowe Robert, PA-C Electronically Signed   By: Albin Felling M.D.   On: 05/09/2021 12:13     ASSESSMENT AND PLAN: This is a very pleasant 70 years old white male recently diagnosed with a stage IV (T2b, N2, M1C) non-small cell lung cancer, adenocarcinoma with positive EGFR mutation in exon 20 (resistant mutation) diagnosed in July 2020 and presented with extensive disease involving the right upper lobe as well as the right middle and lower lobe with extensive pleural based metastasis as well as mediastinal and hilar disease with malignant pleural effusion as well as liver and brain metastasis. Unfortunately the patient has no actionable mutations based on the molecular studies by [employer].   The patient is currently undergoing systemic chemotherapy with carboplatin for AUC of 5, Alimta 500 mg/M2 and Keytruda 200 mg IV every 3 weeks status post 21 cycles.  Starting from cycle #5 the patient is on maintenance treatment with Alimta and Keytruda. Beryle Flock was discontinued after cycle #18 secondary to recurrent immunotherapy mediated pneumonitis. Starting from cycle #19 the patient will be on treatment with single agent Alimta every 3 weeks. The patient has been tolerating the treatment well. He had repeat PET scan and MRI of the brain performed recently.  The PET scan showed evidence for disease progression with metastatic disease involving the thoracic and abdominal lymph nodes as well as the right hemithorax and bones.  He also has loculated moderate right pleural effusion and small left pleural effusion.  The PET scan also showed prostate enlargement and his PSA has been rising recently.  He was seen by urology. MRI of the brain showed development of new 5 mm right temporal lesion  suspicious for metastatic disease as well as new 2 mm focus of enhancement in the left periventricular white matter also suspicious for metastatic disease.  He was seen by radiation oncology and expected to have [place] to this lesion soon. The patient started treatment with Mobocertinib (Axkibity) 235 mg p.o. daily.  Status post 6 months of treatment.  He has been tolerating the treatment well with no concerning adverse effects except for the dryness of his nose that is  not responding to the over-the-counter medication.  His treatment was discontinued secondary to disease progression. Unfortunately he had significant disease progression in the brain with innumerable brain metastasis in addition to previous increase in some of the pulmonary nodules seen on the previous imaging studies.  He completed a course of whole brain irradiation. The patient was treated with Tagrisso 80 mg p.o. daily status post 7 weeks of treatment discontinued secondary to disease progression. The patient started last week the first dose of his treatment with Amivantamab with reduced dose of 350 Mg IV on day 1 and 1050 Mg IV on day 2.  He has hypersensitivity reaction on day 1 required a lot of premedication and treatment with Solu-Medrol, Pepcid and Benadryl as well as Demerol.  He started tolerating his treatment fairly well with no concerning adverse effects starting from cycle #2. He is status post 6 cycles.   The patient tolerated the last cycle of his treatment fairly well. He had repeat CT scan of the chest, abdomen pelvis performed recently.  I personally and independently reviewed the scan images and discussed results with the patient today. Unfortunately his scan showed evidence for disease progression with increase in bilateral pleural effusion as well as the lymphangitic spread of the tumor in the lung.  He also has worsening bone metastasis. He is currently undergoing systemic chemotherapy with docetaxel 75 Mg/M2 with  Neulasta support every 3 weeks.  Status post 1 cycle started on 05/14/2021. The patient tolerated the first cycle of his treatment fairly well with no concerning adverse effects. I recommended for him to proceed with cycle #2 today as planned. For the skin rash, he was seen by dermatology and treated with a course of doxycycline. For the left lower extremity deep venous thrombosis, he will continue his current treatment with Eliquis and I ordered repeat Doppler of the left lower extremity because of the persistent swelling and no improvement on the anticoagulation.  If there is any concerning findings I may refer the patient to vascular surgery for evaluation. For the recurrent right pleural effusion, will continue to monitor closely and consider The patient for thoracentesis if needed. He will come back for follow-up visit in 3 weeks for evaluation before the next cycle of his treatment. The patient was advised to call immediately if he has any other concerning symptoms in the interval. The total time spent in the appointment was 35 minutes.  The patient voices understanding of current disease status and treatment options and is in agreement with the current care plan. All questions were answered. The patient knows to call the clinic with any problems, questions or concerns. We can certainly see the patient much sooner if necessary.  Disclaimer: This note was dictated with voice recognition software. Similar sounding words can inadvertently be transcribed and may not be corrected upon review.

## 2021-06-04 NOTE — Progress Notes (Deleted)
[place] OFFICE PROGRESS NOTE  Laurey Morale, [place] [address]  DIAGNOSIS: ***  PRIOR THERAPY:  CURRENT THERAPY:  INTERVAL HISTORY: Ethan Brown 70 y.o. male returns for *** regular *** visit for followup of ***   MEDICAL HISTORY: Past Medical History:  Diagnosis Date   Borderline hypertension    Brain metastasis ([place]) dx'd 01/2019   Detached vitreous humor, left    Dyspnea    ED (erectile dysfunction) of organic origin    Hyperlipidemia    Hypertension    Lens subluxation, left    Lung cancer ([place]) dx'd 01/2019   Stage IV   Migraine headache    takes PRN Imitrex   Pseudophakia    TGA (transient global amnesia) 2017    ALLERGIES:  has No Known Allergies.  MEDICATIONS:  Current Outpatient Medications  Medication Sig Dispense Refill   amivantamab-vmjw 1,050 mg in sodium chloride 0.9 % 250 mL Inject 1,050 mg into the vein once.     apixaban (ELIQUIS) 5 MG TABS tablet Take 1 tablet (5 mg total) by mouth 2 (two) times daily. 60 tablet 3   clindamycin (CLINDAGEL) 1 % gel APPLY TOPICALLY TWICE A DAY 30 g 0   cycloSPORINE (RESTASIS) 0.05 % ophthalmic emulsion Place 1 drop into both eyes 2 (two) times daily.     dexamethasone (DECADRON) 4 MG tablet 2 tablet p.o. twice daily the day before, day of and day after the chemotherapy every 3 weeks 40 tablet 1   doxycycline (VIBRAMYCIN) 100 MG capsule Take 1 capsule (100 mg total) by mouth 2 (two) times daily. 30 capsule 1   furosemide (LASIX) 20 MG tablet Take 1 tablet (20 mg total) by mouth 2 (two) times daily. 60 tablet 0   hydrochlorothiazide (MICROZIDE) 12.5 MG capsule TAKE 1 CAPSULE BY MOUTH EVERY DAY (Patient taking differently: Take 12.5 mg by mouth every other day.) 90 capsule 3   HYDROcodone bit-homatropine (HYCODAN) 5-1.5 MG/5ML syrup Take 5 mLs by mouth every 6 (six) hours as needed for cough. 473 mL 0   hydrocortisone 1 % lotion Apply 1 application topically 2 (two)  times daily. (Patient not taking: Reported on 05/07/2021) 113 g 0   lidocaine-prilocaine (EMLA) cream Apply to the Port-A-Cath site 30-60 minutes before treatment 30 g 0   LORazepam (ATIVAN) 1 MG tablet Take 1 tablet (1 mg total) by mouth as needed for anxiety (30 minutes prior to radiation treatment and may repeat just prior to procedure if needed.). (Patient not taking: No sig reported) 10 tablet 0   magic mouthwash SOLN Take 5 mLs by mouth 4 (four) times daily as needed for mouth pain. Swish and spit or swallow (Patient not taking: No sig reported) 240 mL 0   memantine (NAMENDA) 10 MG tablet TAKE 1 TABLET BY MOUTH TWICE A DAY (Patient taking differently: Take 10 mg by mouth 2 (two) times daily.) 180 tablet 2   mirtazapine (REMERON) 15 MG tablet Take 1 tablet (15 mg total) by mouth at bedtime. (Patient not taking: Reported on 05/07/2021) 30 tablet 2   mupirocin ointment (BACTROBAN) 2 % 2 (two) times daily as needed.     omeprazole (PRILOSEC) 20 MG capsule Take 20 mg by mouth daily.     prochlorperazine (COMPAZINE) 10 MG tablet Take 1 tablet (10 mg total) by mouth every 6 (six) hours as needed for nausea or vomiting. (Patient not taking: No sig reported) 30 tablet 1   sildenafil (REVATIO) 20  MG tablet TAKE AS DIRECTED (Patient not taking: No sig reported) 10 tablet 11   tamsulosin (FLOMAX) 0.4 MG CAPS capsule Take 0.4 mg by mouth daily.     No current facility-administered medications for this visit.    SURGICAL HISTORY:  Past Surgical History:  Procedure Laterality Date   CAROTID DOPPLERS  09/2017   Mild non-obstructive disease   CATARACT EXTRACTION, BILATERAL     cataract, left  2018   CHEST TUBE INSERTION Right 03/29/2019   Procedure: INSERTION PLEURAL DRAINAGE CATHETER;  Surgeon: [name], MD;  Location: [facility] OR;  Service: Thoracic;  Laterality: Right;   COLONOSCOPY  2016   clear, repeat in 10 yrs    Eye surgeries     , Lens attachment.  Vitrectomy   FEMORAL HERNIA REPAIR      HERNIA REPAIR     IR THORACENTESIS ASP PLEURAL SPACE W/IMG GUIDE  02/22/2019   IR THORACENTESIS ASP PLEURAL SPACE W/IMG GUIDE  03/13/2019   IR THORACENTESIS ASP PLEURAL SPACE W/IMG GUIDE  03/18/2020   LUMBAR LAMINECTOMY     PLEURAL BIOPSY Right 03/29/2019   Procedure: PLEURAL BIOPSY;  Surgeon: [name], MD;  Location: [facility];  Service: Thoracic;  Laterality: Right;   PORTACATH PLACEMENT N/A 03/29/2019   Procedure: INSERTION PORT-A-CATH;  Surgeon: [name], MD;  Location: [facility];  Service: Thoracic;  Laterality: N/A;   retinal attachment, right     sclearl buckle, right     TRANSTHORACIC ECHOCARDIOGRAM  12/14/2019   EF 50 EF 55% (low normal).  Abnormal septal wall motion related to BBB; indeterminate diastolic parameters.  Mild RV enlargement with mildly reduced function.  Normal valves.  Normal atrial sizes.   victrectomy,right     VIDEO ASSISTED THORACOSCOPY Right 03/29/2019   Procedure: VIDEO ASSISTED THORACOSCOPY;  Surgeon: [name], MD;  Location: [facility] OR;  Service: Thoracic;  Laterality: Right;    REVIEW OF SYSTEMS:   Review of Systems  Constitutional: Negative for appetite change, chills, fatigue, fever and unexpected weight change.  HENT:   Negative for mouth sores, nosebleeds, sore throat and trouble swallowing.   Eyes: Negative for eye problems and icterus.  Respiratory: Negative for cough, hemoptysis, shortness of breath and wheezing.   Cardiovascular: Negative for chest pain and leg swelling.  Gastrointestinal: Negative for abdominal pain, constipation, diarrhea, nausea and vomiting.  Genitourinary: Negative for bladder incontinence, difficulty urinating, dysuria, frequency and hematuria.   Musculoskeletal: Negative for back pain, gait problem, neck pain and neck stiffness.  Skin: Negative for itching and rash.  Neurological: Negative for dizziness, extremity weakness, gait problem, headaches, light-headedness and seizures.   Hematological: Negative for adenopathy. Does not bruise/bleed easily.  Psychiatric/Behavioral: Negative for confusion, depression and sleep disturbance. The patient is not nervous/anxious.     PHYSICAL EXAMINATION:  There were no vitals taken for this visit.  ECOG PERFORMANCE STATUS: {CHL ONC ECOG [id number]  Physical Exam  Constitutional: Oriented to person, place, and time and well-developed, well-nourished, and in no distress. No distress.  HENT:  Head: Normocephalic and atraumatic.  Mouth/Throat: Oropharynx is clear and moist. No oropharyngeal exudate.  Eyes: Conjunctivae are normal. Right eye exhibits no discharge. Left eye exhibits no discharge. No scleral icterus.  Neck: Normal range of motion. Neck supple.  Cardiovascular: Normal rate, regular rhythm, normal heart sounds and intact distal pulses.   Pulmonary/Chest: Effort normal and breath sounds normal. No respiratory distress. No wheezes. No rales.  Abdominal: Soft. Bowel sounds are normal. Exhibits no  distension and no mass. There is no tenderness.  Musculoskeletal: Normal range of motion. Exhibits no edema.  Lymphadenopathy:    No cervical adenopathy.  Neurological: Alert and oriented to person, place, and time. Exhibits normal muscle tone. Gait normal. Coordination normal.  Skin: Skin is warm and dry. No rash noted. Not diaphoretic. No erythema. No pallor.  Psychiatric: Mood, memory and judgment normal.  Vitals reviewed.  LABORATORY DATA: Lab Results  Component Value Date   WBC 11.2 (H) 05/28/2021   HGB 12.9 (L) 05/28/2021   HCT 38.5 (L) 05/28/2021   MCV 94.1 05/28/2021   PLT 147 (L) 05/28/2021      Chemistry      Component Value Date/Time   NA 140 05/28/2021 1111   K 3.4 (L) 05/28/2021 1111   CL 105 05/28/2021 1111   CO2 28 05/28/2021 1111   BUN 24 (H) 05/28/2021 1111   CREATININE 0.99 05/28/2021 1111      Component Value Date/Time   CALCIUM 7.6 (L) 05/28/2021 1111   ALKPHOS 191 (H) 05/28/2021 1111    AST 21 05/28/2021 1111   ALT 21 05/28/2021 1111   BILITOT 0.4 05/28/2021 1111       RADIOGRAPHIC STUDIES:  DG Chest 1 View  Result Date: 05/09/2021 CLINICAL DATA:  70 year old male with a history recent thoracentesis. Known non-small cell carcinoma EXAM: CHEST  1 VIEW COMPARISON:  CT 05/05/2021 FINDINGS: Cardiomediastinal silhouette unchanged. Right chest wall port catheter via subclavian approach. Opacity at the right lung base obscures the right hemidiaphragm and the right heart border. Mixed interstitial and airspace opacities of the right mid and lower lung, similar to findings on recent chest CT. Blunting of left costophrenic angle with meniscus overlying the left hemidiaphragm. No pneumothorax. No left-sided airspace disease. IMPRESSION: No complicating features status post left-sided thoracentesis. Right-sided pulmonary/pleural disease better demonstrated on recent CT 05/05/2021 Electronically Signed   By: Corrie Mckusick D.O.   On: 05/09/2021 12:04   CT Chest W Contrast  Result Date: 05/05/2021 CLINICAL DATA:  Non-small cell lung cancer staging, chemotherapy and XRT complete, ongoing immunotherapy EXAM: CT CHEST, ABDOMEN, AND PELVIS WITH CONTRAST TECHNIQUE: Multidetector CT imaging of the chest, abdomen and pelvis was performed following the standard protocol during bolus administration of intravenous contrast. CONTRAST:  46mL OMNIPAQUE IOHEXOL 350 MG/ML SOLN, additional oral enteric contrast COMPARISON:  02/27/2021 FINDINGS: CT CHEST FINDINGS Cardiovascular: Right chest port catheter. Aortic atherosclerosis. Normal heart size. No pericardial effusion. Mediastinum/Nodes: Unchanged appearance of prominent subcentimeter mediastinal lymph nodes and soft tissue thickening about the lower trachea and hila (series 2, image 25). Thyroid gland, trachea, and esophagus demonstrate no significant findings. Lungs/Pleura: Moderate left pleural effusion, increased compared to prior examination. Interval  increase in extensive heterogeneous airspace opacity, nodularity, and interlobular septal thickening throughout the right lung. Small, loculated appearing right pleural effusion with pleural thickening. Musculoskeletal: No chest wall mass. CT ABDOMEN PELVIS FINDINGS Hepatobiliary: No solid liver abnormality is seen. No gallstones, gallbladder wall thickening, or biliary dilatation. Pancreas: Unremarkable. No pancreatic ductal dilatation or surrounding inflammatory changes. Spleen: Normal in size without significant abnormality. Adrenals/Urinary Tract: Adrenal glands are unremarkable. Kidneys are normal, without renal calculi, solid lesion, or hydronephrosis. Thickening of the urinary bladder wall with small diverticula. Small calculi in the dependent bladder (series 2, image 113). Stomach/Bowel: Stomach is within normal limits. Appendix appears normal. No evidence of bowel wall thickening, distention, or inflammatory changes. Descending and sigmoid diverticulosis. Vascular/Lymphatic: No significant vascular findings are present. No enlarged abdominal or pelvic lymph  nodes. Reproductive: Prostatomegaly. Other: No abdominal wall hernia or abnormality. No abdominopelvic ascites. Unchanged nonspecific fat stranding in the central small bowel mesentery (series 2, image 81). Musculoskeletal: Multiple new and enlarged sclerotic osseous metastases, for example of the L1 vertebral body and T10 vertebral body (series 6, image 117). IMPRESSION: 1. Interval increase in extensive heterogeneous airspace opacity, nodularity, and interlobular septal thickening throughout the right lung. Findings are consistent with worsened lymphangitic spread of disease. 2. Unchanged appearance of prominent subcentimeter mediastinal lymph nodes and soft tissue thickening about the lower trachea and hila. 3. Small, loculated appearing right pleural effusion with metastatic pleural thickening. 4. Moderate left pleural effusion, increased compared to  prior examination, presumed malignant however without pleural thickening or other direct evidence of pleural metastatic disease. 5. Multiple new and enlarged sclerotic osseous metastases, consistent with worsened, although treated osseous metastatic disease. 6. No evidence of organ metastatic disease within the abdomen or pelvis. 7. Prostatomegaly and thickening of the urinary bladder wall with small diverticula, likely due to chronic outlet obstruction. 8. Small calculi in the dependent bladder. Aortic Atherosclerosis (ICD10-I70.0). Electronically Signed   By: Eddie Candle M.D.   On: 05/05/2021 17:40   CT Abdomen Pelvis W Contrast  Result Date: 05/05/2021 CLINICAL DATA:  Non-small cell lung cancer staging, chemotherapy and XRT complete, ongoing immunotherapy EXAM: CT CHEST, ABDOMEN, AND PELVIS WITH CONTRAST TECHNIQUE: Multidetector CT imaging of the chest, abdomen and pelvis was performed following the standard protocol during bolus administration of intravenous contrast. CONTRAST:  29mL OMNIPAQUE IOHEXOL 350 MG/ML SOLN, additional oral enteric contrast COMPARISON:  02/27/2021 FINDINGS: CT CHEST FINDINGS Cardiovascular: Right chest port catheter. Aortic atherosclerosis. Normal heart size. No pericardial effusion. Mediastinum/Nodes: Unchanged appearance of prominent subcentimeter mediastinal lymph nodes and soft tissue thickening about the lower trachea and hila (series 2, image 25). Thyroid gland, trachea, and esophagus demonstrate no significant findings. Lungs/Pleura: Moderate left pleural effusion, increased compared to prior examination. Interval increase in extensive heterogeneous airspace opacity, nodularity, and interlobular septal thickening throughout the right lung. Small, loculated appearing right pleural effusion with pleural thickening. Musculoskeletal: No chest wall mass. CT ABDOMEN PELVIS FINDINGS Hepatobiliary: No solid liver abnormality is seen. No gallstones, gallbladder wall thickening, or  biliary dilatation. Pancreas: Unremarkable. No pancreatic ductal dilatation or surrounding inflammatory changes. Spleen: Normal in size without significant abnormality. Adrenals/Urinary Tract: Adrenal glands are unremarkable. Kidneys are normal, without renal calculi, solid lesion, or hydronephrosis. Thickening of the urinary bladder wall with small diverticula. Small calculi in the dependent bladder (series 2, image 113). Stomach/Bowel: Stomach is within normal limits. Appendix appears normal. No evidence of bowel wall thickening, distention, or inflammatory changes. Descending and sigmoid diverticulosis. Vascular/Lymphatic: No significant vascular findings are present. No enlarged abdominal or pelvic lymph nodes. Reproductive: Prostatomegaly. Other: No abdominal wall hernia or abnormality. No abdominopelvic ascites. Unchanged nonspecific fat stranding in the central small bowel mesentery (series 2, image 81). Musculoskeletal: Multiple new and enlarged sclerotic osseous metastases, for example of the L1 vertebral body and T10 vertebral body (series 6, image 117). IMPRESSION: 1. Interval increase in extensive heterogeneous airspace opacity, nodularity, and interlobular septal thickening throughout the right lung. Findings are consistent with worsened lymphangitic spread of disease. 2. Unchanged appearance of prominent subcentimeter mediastinal lymph nodes and soft tissue thickening about the lower trachea and hila. 3. Small, loculated appearing right pleural effusion with metastatic pleural thickening. 4. Moderate left pleural effusion, increased compared to prior examination, presumed malignant however without pleural thickening or other direct evidence of pleural metastatic disease.  5. Multiple new and enlarged sclerotic osseous metastases, consistent with worsened, although treated osseous metastatic disease. 6. No evidence of organ metastatic disease within the abdomen or pelvis. 7. Prostatomegaly and thickening  of the urinary bladder wall with small diverticula, likely due to chronic outlet obstruction. 8. Small calculi in the dependent bladder. Aortic Atherosclerosis (ICD10-I70.0). Electronically Signed   By: Eddie Candle M.D.   On: 05/05/2021 17:40   US Thoracentesis Asp Pleural space w/IMG guide  Result Date: 05/09/2021 INDICATION: Patient with history of stage IV lung adenocarcinoma, left pleural effusion. Request received for therapeutic left thoracentesis. EXAM: ULTRASOUND GUIDED THERAPEUTIC LEFT THORACENTESIS MEDICATIONS: None COMPLICATIONS: None immediate. PROCEDURE: An ultrasound guided thoracentesis was thoroughly discussed with the patient and questions answered. The benefits, risks, alternatives and complications were also discussed. The patient understands and wishes to proceed with the procedure. Written consent was obtained. Ultrasound was performed to localize and mark an adequate pocket of fluid in the left chest. The area was then prepped and draped in the normal sterile fashion. 1% Lidocaine was used for local anesthesia. Under ultrasound guidance a 6 Fr Safe-T-Centesis catheter was introduced. Thoracentesis was performed. The catheter was removed and a dressing applied. FINDINGS: A total of approximately 1 liter of amber fluid was removed. IMPRESSION: Successful ultrasound guided therapeutic left thoracentesis yielding 1 liter of pleural fluid. Read by: Rowe Robert, PA-C Electronically Signed   By: Albin Felling M.D.   On: 05/09/2021 12:13     ASSESSMENT/PLAN:  No problem-specific Assessment & Plan notes found for this encounter.   No orders of the defined types were placed in this encounter.    I spent {CHL ONC TIME VISIT - PNTIR:[id number]} counseling the patient face to face. The total time spent in the appointment was {CHL ONC TIME VISIT - PYPPJ:[id number]}.  Lennis Korb L Emileo Semel, PA-C 06/04/21

## 2021-06-05 ENCOUNTER — Ambulatory Visit (HOSPITAL_COMMUNITY)
Admission: RE | Admit: 2021-06-05 | Discharge: 2021-06-05 | Disposition: A | Discharge status: Home / Self Care | Payer: Medicare Other | Source: Ambulatory Visit | Attending: Internal Medicine | Admitting: Internal Medicine

## 2021-06-05 ENCOUNTER — Other Ambulatory Visit: Payer: Self-pay | Admitting: Internal Medicine

## 2021-06-05 DIAGNOSIS — I824Y9 Acute embolism and thrombosis of unspecified deep veins of unspecified proximal lower extremity: Secondary | ICD-10-CM | POA: Diagnosis not present

## 2021-06-05 DIAGNOSIS — I824Z2 Acute embolism and thrombosis of unspecified deep veins of left distal lower extremity: Secondary | ICD-10-CM | POA: Diagnosis not present

## 2021-06-05 DIAGNOSIS — C3491 Malignant neoplasm of unspecified part of right bronchus or lung: Secondary | ICD-10-CM

## 2021-06-05 NOTE — Progress Notes (Addendum)
Left lower extremity venous duplex completed. Refer to "CV Proc" under chart review to view preliminary results.  Preliminary results discussed with Dr. Julien Nordmann.  06/05/2021 3:40 PM Kelby Aline., MHA, RVT, RDCS, RDMS

## 2021-06-06 ENCOUNTER — Inpatient Hospital Stay: Payer: Medicare Other

## 2021-06-06 ENCOUNTER — Encounter: Payer: Self-pay | Admitting: Internal Medicine

## 2021-06-06 ENCOUNTER — Other Ambulatory Visit: Payer: Self-pay | Admitting: Internal Medicine

## 2021-06-06 ENCOUNTER — Other Ambulatory Visit: Payer: Self-pay

## 2021-06-06 VITALS — BP 129/91 | HR 84 | Temp 98.8°F | Resp 18

## 2021-06-06 DIAGNOSIS — Z86718 Personal history of other venous thrombosis and embolism: Secondary | ICD-10-CM | POA: Diagnosis not present

## 2021-06-06 DIAGNOSIS — Z5112 Encounter for antineoplastic immunotherapy: Secondary | ICD-10-CM | POA: Diagnosis not present

## 2021-06-06 DIAGNOSIS — Z9221 Personal history of antineoplastic chemotherapy: Secondary | ICD-10-CM | POA: Diagnosis not present

## 2021-06-06 DIAGNOSIS — Z7901 Long term (current) use of anticoagulants: Secondary | ICD-10-CM | POA: Diagnosis not present

## 2021-06-06 DIAGNOSIS — C7931 Secondary malignant neoplasm of brain: Secondary | ICD-10-CM | POA: Diagnosis not present

## 2021-06-06 DIAGNOSIS — C342 Malignant neoplasm of middle lobe, bronchus or lung: Secondary | ICD-10-CM | POA: Diagnosis not present

## 2021-06-06 DIAGNOSIS — C3491 Malignant neoplasm of unspecified part of right bronchus or lung: Secondary | ICD-10-CM

## 2021-06-06 MED ORDER — PEGFILGRASTIM-CBQV 6 MG/0.6ML ~~LOC~~ SOSY
6.0000 mg | PREFILLED_SYRINGE | Freq: Once | SUBCUTANEOUS | Status: AC
  Administered 2021-06-06: 6 mg via SUBCUTANEOUS
  Filled 2021-06-06: qty 0.6

## 2021-06-08 ENCOUNTER — Encounter: Payer: Self-pay | Admitting: Internal Medicine

## 2021-06-09 ENCOUNTER — Other Ambulatory Visit: Payer: Self-pay | Admitting: Physician Assistant

## 2021-06-09 ENCOUNTER — Encounter: Payer: Self-pay | Admitting: Medical Oncology

## 2021-06-09 ENCOUNTER — Other Ambulatory Visit: Payer: Self-pay | Admitting: Internal Medicine

## 2021-06-09 ENCOUNTER — Telehealth: Payer: Self-pay | Admitting: Physician Assistant

## 2021-06-09 ENCOUNTER — Other Ambulatory Visit: Payer: Self-pay | Admitting: *Deleted

## 2021-06-09 DIAGNOSIS — I824Z2 Acute embolism and thrombosis of unspecified deep veins of left distal lower extremity: Secondary | ICD-10-CM

## 2021-06-09 DIAGNOSIS — C3491 Malignant neoplasm of unspecified part of right bronchus or lung: Secondary | ICD-10-CM

## 2021-06-09 MED ORDER — APIXABAN 5 MG PO TABS: 5.0000 mg | ORAL_TABLET | Freq: Two times a day (BID) | ORAL | 3 refills | Status: DC

## 2021-06-09 MED ORDER — DOXYCYCLINE HYCLATE 100 MG PO CAPS: 100.0000 mg | ORAL_CAPSULE | Freq: Two times a day (BID) | ORAL | 1 refills | Status: DC

## 2021-06-09 NOTE — Telephone Encounter (Signed)
I called the patient to discuss the MyChart message sent regarding his refill request.  Discussed with Dr. Julien Nordmann who do not recommend refilling his doxycycline.  Regarding his blood thinner, the patient still continues to have evidence of DVT in his lower extremity, although improved but not completely resolved.  Dr. Julien Nordmann referred him to a vascular practice on 06/05/21. The patient does not have any appointments at this time as a referral was just placed.  Dr. Julien Nordmann discussed that we can switch him to Lovenox versus continuing Eliquis for now and waiting to see what the vascular physician would recommend.  The patient would rather wait to see what the vascular physician would recommend regarding lymphedema and DVT.  In the meantime, I will refill his Eliquis.  The patient also requested a refill of his memantine for which a refill request was sent to radiation oncology.

## 2021-06-10 ENCOUNTER — Telehealth: Payer: Self-pay | Admitting: Radiation Therapy

## 2021-06-10 NOTE — Telephone Encounter (Signed)
Per Ashlyn Bruning's request, I called [name] to let him know he does not need to continue the Namenda at this point, he has been on it for 6 months and that was the study protocol so only proven to give benefit when used for 6 months with SRS radiation.  Ethan Brown was appreciative for the call and said he actually remembered Ashlyn saying something about that after I had told him. I also mentioned the follow-up brain MRI and visit with Dr. Mickeal Skinner in November. He has this documented and will call back with any questions or concerns.   Mont Dutton R.T.(R)(T)  Radiation Special Procedures Navigator

## 2021-06-11 ENCOUNTER — Inpatient Hospital Stay: Payer: Medicare Other

## 2021-06-11 ENCOUNTER — Other Ambulatory Visit: Payer: Self-pay

## 2021-06-11 DIAGNOSIS — Z86718 Personal history of other venous thrombosis and embolism: Secondary | ICD-10-CM | POA: Diagnosis not present

## 2021-06-11 DIAGNOSIS — Z5112 Encounter for antineoplastic immunotherapy: Secondary | ICD-10-CM | POA: Diagnosis not present

## 2021-06-11 DIAGNOSIS — Z23 Encounter for immunization: Secondary | ICD-10-CM | POA: Diagnosis not present

## 2021-06-11 DIAGNOSIS — C7931 Secondary malignant neoplasm of brain: Secondary | ICD-10-CM | POA: Diagnosis not present

## 2021-06-11 DIAGNOSIS — Z9221 Personal history of antineoplastic chemotherapy: Secondary | ICD-10-CM | POA: Diagnosis not present

## 2021-06-11 DIAGNOSIS — C3491 Malignant neoplasm of unspecified part of right bronchus or lung: Secondary | ICD-10-CM

## 2021-06-11 DIAGNOSIS — C342 Malignant neoplasm of middle lobe, bronchus or lung: Secondary | ICD-10-CM | POA: Diagnosis not present

## 2021-06-11 DIAGNOSIS — Z7901 Long term (current) use of anticoagulants: Secondary | ICD-10-CM | POA: Diagnosis not present

## 2021-06-11 DIAGNOSIS — Z95828 Presence of other vascular implants and grafts: Secondary | ICD-10-CM

## 2021-06-11 LAB — CMP (CANCER CENTER ONLY)
ALT: 24 U/L (ref 0–44)
AST: 26 U/L (ref 15–41)
Albumin: 1.7 g/dL — ABNORMAL LOW (ref 3.5–5.0)
Alkaline Phosphatase: 151 U/L — ABNORMAL HIGH (ref 38–126)
Anion gap: 9 (ref 5–15)
BUN: 23 mg/dL (ref 8–23)
CO2: 25 mmol/L (ref 22–32)
Calcium: 7.9 mg/dL — ABNORMAL LOW (ref 8.9–10.3)
Chloride: 104 mmol/L (ref 98–111)
Creatinine: 0.86 mg/dL (ref 0.61–1.24)
GFR, Estimated: >60 mL/min (ref >60)
Glucose, Bld: 79 mg/dL (ref 70–99)
Potassium: 3.5 mmol/L (ref 3.5–5.1)
Sodium: 138 mmol/L (ref 135–145)
Total Bilirubin: 0.3 mg/dL (ref 0.3–1.2)
Total Protein: 4.3 g/dL — ABNORMAL LOW (ref 6.5–8.1)

## 2021-06-11 LAB — CBC WITH DIFFERENTIAL (CANCER CENTER ONLY)
Abs Immature Granulocytes: 1.98 10*3/uL — ABNORMAL HIGH (ref 0.00–0.07)
Basophils Absolute: 0 10*3/uL (ref 0.0–0.1)
Basophils Relative: 0 %
Eosinophils Absolute: 0 10*3/uL (ref 0.0–0.5)
Eosinophils Relative: 0 %
HCT: 35.7 % — ABNORMAL LOW (ref 39.0–52.0)
Hemoglobin: 12.3 g/dL — ABNORMAL LOW (ref 13.0–17.0)
Immature Granulocytes: 17 %
Lymphocytes Relative: 13 %
Lymphs Abs: 1.5 10*3/uL (ref 0.7–4.0)
MCH: 32.2 pg (ref 26.0–34.0)
MCHC: 34.5 g/dL (ref 30.0–36.0)
MCV: 93.5 fL (ref 80.0–100.0)
Monocytes Absolute: 1.8 10*3/uL — ABNORMAL HIGH (ref 0.1–1.0)
Monocytes Relative: 16 %
Neutro Abs: 6.2 10*3/uL (ref 1.7–7.7)
Neutrophils Relative %: 54 %
Platelet Count: 109 10*3/uL — ABNORMAL LOW (ref 150–400)
RBC: 3.82 MIL/uL — ABNORMAL LOW (ref 4.22–5.81)
RDW: 14.7 % (ref 11.5–15.5)
Smear Review: NORMAL
WBC Count: 11.6 10*3/uL — ABNORMAL HIGH (ref 4.0–10.5)
nRBC: 0.3 % — ABNORMAL HIGH (ref 0.0–0.2)

## 2021-06-11 MED ORDER — HEPARIN SOD (PORK) LOCK FLUSH 100 UNIT/ML IV SOLN
500.0000 [IU] | Freq: Once | INTRAVENOUS | Status: AC | PRN
  Administered 2021-06-11: 500 [IU]

## 2021-06-11 MED ORDER — SODIUM CHLORIDE 0.9% FLUSH
10.0000 mL | INTRAVENOUS | Status: DC | PRN
  Administered 2021-06-11: 10 mL

## 2021-06-16 NOTE — Progress Notes (Addendum)
[facility] [place]  ASSESSMENT / PLAN: Ethan Brown is a 70 y.o. male with metastatic lung cancer to liver and brain with malignancy related left lower extremity deep venous thrombosis. He has generalized edema with 2+ edema in the lower extremities and hands. No interventions available to palliate symptoms. Hypoalbuminemia and protein-calorie malnutrition are certainly playing a role. Recommend compression and elevation for symptomatic relief. Follow up with me as needed.  CHIEF COMPLAINT: persistent swelling after left lower extremity deep venous thrombosis.   HISTORY OF PRESENT ILLNESS: Ethan Brown is a 70 y.o. male with history of metastatic lung cancer who developed a left leg deep venous thrombosis in August 2022.  He has initiated treatment with Eliquis.  He has since developed global swelling in bilateral lower extremities and his hands.  He has significant hypoalbuminemia  with last measured being 1.7.  He is working hard with a nutritionist to improve his protein intake.  He has had this happen to him before and had significant relief with compression wrapping.  We had a long discussion about fluid physiology and natural history of hypoalbuminemia along with deep venous thrombosis.  Past Medical History:  Diagnosis Date   Borderline hypertension    Brain metastasis ([place]) dx'd 01/2019   Detached vitreous humor, left    Dyspnea    ED (erectile dysfunction) of organic origin    Hyperlipidemia    Hypertension    Lens subluxation, left    Lung cancer ([place]) dx'd 01/2019   Stage IV   Migraine headache    takes PRN Imitrex   Pseudophakia    TGA (transient global amnesia) 2017    Past Surgical History:  Procedure Laterality Date   CAROTID DOPPLERS  09/2017   Mild non-obstructive disease   CATARACT EXTRACTION, BILATERAL     cataract, left  2018   CHEST TUBE INSERTION Right 03/29/2019   Procedure: INSERTION PLEURAL DRAINAGE CATHETER;   Surgeon: [name], MD;  Location: [facility];  Service: Thoracic;  Laterality: Right;   COLONOSCOPY  2016   clear, repeat in 10 yrs    Eye surgeries     , Lens attachment.  Vitrectomy   FEMORAL HERNIA REPAIR     HERNIA REPAIR     IR THORACENTESIS ASP PLEURAL SPACE W/IMG GUIDE  02/22/2019   IR THORACENTESIS ASP PLEURAL SPACE W/IMG GUIDE  03/13/2019   IR THORACENTESIS ASP PLEURAL SPACE W/IMG GUIDE  03/18/2020   LUMBAR LAMINECTOMY     PLEURAL BIOPSY Right 03/29/2019   Procedure: PLEURAL BIOPSY;  Surgeon: [name], MD;  Location: [facility];  Service: Thoracic;  Laterality: Right;   PORTACATH PLACEMENT N/A 03/29/2019   Procedure: INSERTION PORT-A-CATH;  Surgeon: [name], MD;  Location: [facility];  Service: Thoracic;  Laterality: N/A;   retinal attachment, right     sclearl buckle, right     TRANSTHORACIC ECHOCARDIOGRAM  12/14/2019   EF 50 EF 55% (low normal).  Abnormal septal wall motion related to BBB; indeterminate diastolic parameters.  Mild RV enlargement with mildly reduced function.  Normal valves.  Normal atrial sizes.   victrectomy,right     VIDEO ASSISTED THORACOSCOPY Right 03/29/2019   Procedure: VIDEO ASSISTED THORACOSCOPY;  Surgeon: [name], MD;  Location: [facility];  Service: Thoracic;  Laterality: Right;    Family History  Problem Relation Age of Onset   Alcohol abuse Mother    Diabetes Father    Hypertension Father    Prostate cancer  Father    Cancer Father    Heart attack Maternal Grandfather 78    Social History   Socioeconomic History   Marital status: Married    Spouse name: Ethan Brown   Number of children: 3   Years of education: Not on file   Highest education level: Master's degree (e.g., MA, MS, MEng, MEd, MSW, MBA)  Occupational History   Occupation: retired Optometrist  Tobacco Use   Smoking status: Never   Smokeless tobacco: Never  Vaping Use   Vaping Use: Never used  Substance and Sexual Activity   Alcohol use:  Yes    Alcohol/week: 1.0 standard drink    Types: 1 Glasses of wine per week   Drug use: No   Sexual activity: Not Currently  Other Topics Concern   Not on file  Social History Narrative   Married father of 23, grandfather 4.  Lives with his wife, Ethan Brown they recently moved to [place] after living in [place].      He is a retired Engineer, maintenance (IT)   Was previously on an exercise regimen at [employer] 3 days a week for 90 minutes at a time --> he is now hoping to get back into that plan, but is currently now riding his bike at least 30 to 40 minutes a day for 5 days a week and then doing longer walks about 6 days a week.   Social Determinants of Health   Financial Resource Strain: Not on file  Food Insecurity: Not on file  Transportation Needs: Not on file  Physical Activity: Not on file  Stress: Not on file  Social Connections: Not on file  Intimate Partner Violence: Not on file    No Known Allergies  Current Outpatient Medications  Medication Sig Dispense Refill   amivantamab-vmjw 1,050 mg in sodium chloride 0.9 % 250 mL Inject 1,050 mg into the vein once.     apixaban (ELIQUIS) 5 MG TABS tablet Take 1 tablet (5 mg total) by mouth 2 (two) times daily. 60 tablet 3   clindamycin (CLINDAGEL) 1 % gel APPLY TOPICALLY TWICE A DAY 30 g 0   cycloSPORINE (RESTASIS) 0.05 % ophthalmic emulsion Place 1 drop into both eyes 2 (two) times daily.     dexamethasone (DECADRON) 4 MG tablet 2 tablet p.o. twice daily the day before, day of and day after the chemotherapy every 3 weeks 40 tablet 1   furosemide (LASIX) 20 MG tablet TAKE 1 TABLET BY MOUTH TWICE A DAY 60 tablet 0   hydrochlorothiazide (MICROZIDE) 12.5 MG capsule TAKE 1 CAPSULE BY MOUTH EVERY DAY (Patient taking differently: Take 12.5 mg by mouth every other day.) 90 capsule 3   HYDROcodone bit-homatropine (HYCODAN) 5-1.5 MG/5ML syrup Take 5 mLs by mouth every 6 (six) hours as needed for cough. 473 mL 0   lidocaine-prilocaine (EMLA)  cream Apply to the Port-A-Cath site 30-60 minutes before treatment 30 g 0   memantine (NAMENDA) 10 MG tablet TAKE 1 TABLET BY MOUTH TWICE A DAY (Patient taking differently: Take 10 mg by mouth 2 (two) times daily.) 180 tablet 2   mirtazapine (REMERON) 15 MG tablet Take 1 tablet (15 mg total) by mouth at bedtime. 30 tablet 2   mupirocin ointment (BACTROBAN) 2 % 2 (two) times daily as needed.     omeprazole (PRILOSEC) 20 MG capsule Take 20 mg by mouth daily.     tamsulosin (FLOMAX) 0.4 MG CAPS capsule Take 0.4 mg by mouth daily.     doxycycline (VIBRAMYCIN)  100 MG capsule Take 1 capsule (100 mg total) by mouth 2 (two) times daily. (Patient not taking: Reported on 06/17/2021) 30 capsule 1   hydrocortisone 1 % lotion Apply 1 application topically 2 (two) times daily. (Patient not taking: No sig reported) 113 g 0   magic mouthwash SOLN Take 5 mLs by mouth 4 (four) times daily as needed for mouth pain. Swish and spit or swallow (Patient not taking: No sig reported) 240 mL 0   prochlorperazine (COMPAZINE) 10 MG tablet Take 1 tablet (10 mg total) by mouth every 6 (six) hours as needed for nausea or vomiting. (Patient not taking: No sig reported) 30 tablet 1   sildenafil (REVATIO) 20 MG tablet TAKE AS DIRECTED (Patient not taking: No sig reported) 10 tablet 11   No current facility-administered medications for this visit.    REVIEW OF SYSTEMS:  [X]  denotes positive finding, [ ]  denotes negative finding Cardiac  Comments:  Chest pain or chest pressure:    Shortness of breath upon exertion:    Short of breath when lying flat:    Irregular heart rhythm:        Vascular    Pain in calf, thigh, or hip brought on by ambulation:    Pain in feet at night that wakes you up from your sleep:     Blood clot in your veins:    Leg swelling:  x       Pulmonary    Oxygen at home:    Productive cough:     Wheezing:         Neurologic    Sudden weakness in arms or legs:     Sudden numbness in arms or legs:      Sudden onset of difficulty speaking or slurred speech:    Temporary loss of vision in one eye:     Problems with dizziness:         Gastrointestinal    Blood in stool:     Vomited blood:         Genitourinary    Burning when urinating:     Blood in urine:        Psychiatric    Major depression:         Hematologic    Bleeding problems:    Problems with blood clotting too easily:        Skin    Rashes or ulcers:        Constitutional    Fever or chills:      PHYSICAL EXAM Vitals:   06/17/21 0824  BP: 109/71  Pulse: 90  Resp: 20  Temp: 98.1 F (36.7 C)  SpO2: 98%  Weight: 213 lb (96.6 kg)  Height: 5\' 11"  (1.803 m)    Constitutional: well appearing. no distress. Appears well nourished.  Neurologic: CN intact. no focal findings. no sensory loss. Psychiatric:  Mood and affect symmetric and appropriate. Eyes:  No icterus. No conjunctival pallor. Ears, nose, throat:  mucous membranes moist. Midline trachea.  Cardiac: regular rate and rhythm.  Respiratory:  unlabored. Abdominal:  soft, non-tender, non-distended.  Peripheral vascular: no palpable pulses likely secondary to extreme edema Extremity: 2+ edema from feet to thighs in bilateral lower extremities. 2+ edema about the left > right hand. No cyanosis. No pallor.  Skin: No gangrene. No ulceration.  Lymphatic: Equivocal Stemmer's sign. No palpable lymphadenopathy.  PERTINENT LABORATORY AND RADIOLOGIC DATA  Most recent CBC CBC Latest Ref Rng & Units 06/11/2021 06/04/2021 05/28/2021  WBC 4.0 - 10.5 K/uL 11.6(H) 16.2(H) 11.2(H)  Hemoglobin 13.0 - 17.0 g/dL 12.3(L) 13.6 12.9(L)  Hematocrit 39.0 - 52.0 % 35.7(L) 40.1 38.5(L)  Platelets 150 - 400 K/uL 109(L) 366 147(L)     Most recent CMP CMP Latest Ref Rng & Units 06/11/2021 06/04/2021 05/28/2021  Glucose 70 - 99 mg/dL 79 154(H) 106(H)  BUN 8 - 23 mg/dL 23 30(H) 24(H)  Creatinine 0.61 - 1.24 mg/dL 0.86 1.15 0.99  Sodium 135 - 145 mmol/L 138 141 140   Potassium 3.5 - 5.1 mmol/L 3.5 3.3(L) 3.4(L)  Chloride 98 - 111 mmol/L 104 106 105  CO2 22 - 32 mmol/L 25 29 28   Calcium 8.9 - 10.3 mg/dL 7.9(L) 8.4(L) 7.6(L)  Total Protein 6.5 - 8.1 g/dL 4.3(L) 4.9(L) 4.2(L)  Total Bilirubin 0.3 - 1.2 mg/dL 0.3 0.4 0.4  Alkaline Phos 38 - 126 U/L 151(H) 160(H) 191(H)  AST 15 - 41 U/L 26 26 21   ALT 0 - 44 U/L 24 27 21     Renal function Estimated Creatinine Clearance: 93.4 mL/min (by C-G formula based on SCr of 0.86 mg/dL).  No results found for: HGBA1C  LDL Cholesterol  Date Value Ref Range Status  08/30/2018 104 (H) 0 - 99 mg/dL Final     Vascular Imaging: Venous duplex from 03/28/2021 and 06/05/2021 personally reviewed. Chronic thrombus persists in the femoral vein and below.  Yevonne Aline. Stanford Breed, MD [facility] Phone Number: [phone] 06/17/2021 9:22 AM  Total time spent on preparing this encounter including chart review, data review, collecting history, examining the patient, coordinating care for this new patient, 45 minutes.  Portions of this report may have been transcribed using voice recognition software.  Every effort has been made to ensure accuracy; however, inadvertent computerized transcription errors may still be present.

## 2021-06-17 ENCOUNTER — Ambulatory Visit (INDEPENDENT_AMBULATORY_CARE_PROVIDER_SITE_OTHER): Payer: Medicare Other | Admitting: Vascular Surgery

## 2021-06-17 ENCOUNTER — Other Ambulatory Visit: Payer: Self-pay

## 2021-06-17 ENCOUNTER — Encounter: Payer: Self-pay | Admitting: Vascular Surgery

## 2021-06-17 VITALS — BP 109/71 | HR 90 | Temp 98.1°F | Resp 20 | Ht 71.0 in | Wt 213.0 lb

## 2021-06-17 DIAGNOSIS — C3490 Malignant neoplasm of unspecified part of unspecified bronchus or lung: Secondary | ICD-10-CM | POA: Diagnosis not present

## 2021-06-17 DIAGNOSIS — E8809 Other disorders of plasma-protein metabolism, not elsewhere classified: Secondary | ICD-10-CM

## 2021-06-17 DIAGNOSIS — E43 Unspecified severe protein-calorie malnutrition: Secondary | ICD-10-CM | POA: Diagnosis not present

## 2021-06-17 DIAGNOSIS — E46 Unspecified protein-calorie malnutrition: Secondary | ICD-10-CM | POA: Diagnosis not present

## 2021-06-17 DIAGNOSIS — K909 Intestinal malabsorption, unspecified: Secondary | ICD-10-CM | POA: Diagnosis not present

## 2021-06-17 DIAGNOSIS — R601 Generalized edema: Secondary | ICD-10-CM | POA: Diagnosis not present

## 2021-06-18 ENCOUNTER — Inpatient Hospital Stay: Payer: Medicare Other

## 2021-06-18 DIAGNOSIS — Z95828 Presence of other vascular implants and grafts: Secondary | ICD-10-CM

## 2021-06-18 DIAGNOSIS — Z86718 Personal history of other venous thrombosis and embolism: Secondary | ICD-10-CM | POA: Diagnosis not present

## 2021-06-18 DIAGNOSIS — C7931 Secondary malignant neoplasm of brain: Secondary | ICD-10-CM | POA: Diagnosis not present

## 2021-06-18 DIAGNOSIS — Z9221 Personal history of antineoplastic chemotherapy: Secondary | ICD-10-CM | POA: Diagnosis not present

## 2021-06-18 DIAGNOSIS — C342 Malignant neoplasm of middle lobe, bronchus or lung: Secondary | ICD-10-CM | POA: Diagnosis not present

## 2021-06-18 DIAGNOSIS — Z7901 Long term (current) use of anticoagulants: Secondary | ICD-10-CM | POA: Diagnosis not present

## 2021-06-18 DIAGNOSIS — C3491 Malignant neoplasm of unspecified part of right bronchus or lung: Secondary | ICD-10-CM

## 2021-06-18 DIAGNOSIS — Z5112 Encounter for antineoplastic immunotherapy: Secondary | ICD-10-CM | POA: Diagnosis not present

## 2021-06-18 LAB — CBC WITH DIFFERENTIAL (CANCER CENTER ONLY)
Abs Immature Granulocytes: 0.22 10*3/uL — ABNORMAL HIGH (ref 0.00–0.07)
Basophils Absolute: 0.1 10*3/uL (ref 0.0–0.1)
Basophils Relative: 0 %
Eosinophils Absolute: 0 10*3/uL (ref 0.0–0.5)
Eosinophils Relative: 0 %
HCT: 36.9 % — ABNORMAL LOW (ref 39.0–52.0)
Hemoglobin: 12.4 g/dL — ABNORMAL LOW (ref 13.0–17.0)
Immature Granulocytes: 1 %
Lymphocytes Relative: 7 %
Lymphs Abs: 1.4 10*3/uL (ref 0.7–4.0)
MCH: 31.6 pg (ref 26.0–34.0)
MCHC: 33.6 g/dL (ref 30.0–36.0)
MCV: 94.1 fL (ref 80.0–100.0)
Monocytes Absolute: 0.6 10*3/uL (ref 0.1–1.0)
Monocytes Relative: 3 %
Neutro Abs: 18.4 10*3/uL — ABNORMAL HIGH (ref 1.7–7.7)
Neutrophils Relative %: 89 %
Platelet Count: 138 10*3/uL — ABNORMAL LOW (ref 150–400)
RBC: 3.92 MIL/uL — ABNORMAL LOW (ref 4.22–5.81)
RDW: 15.6 % — ABNORMAL HIGH (ref 11.5–15.5)
WBC Count: 20.7 10*3/uL — ABNORMAL HIGH (ref 4.0–10.5)
nRBC: 0 % (ref 0.0–0.2)

## 2021-06-18 LAB — CMP (CANCER CENTER ONLY)
ALT: 26 U/L (ref 0–44)
AST: 24 U/L (ref 15–41)
Albumin: 1.6 g/dL — ABNORMAL LOW (ref 3.5–5.0)
Alkaline Phosphatase: 200 U/L — ABNORMAL HIGH (ref 38–126)
Anion gap: 10 (ref 5–15)
BUN: 23 mg/dL (ref 8–23)
CO2: 24 mmol/L (ref 22–32)
Calcium: 7.6 mg/dL — ABNORMAL LOW (ref 8.9–10.3)
Chloride: 106 mmol/L (ref 98–111)
Creatinine: 0.91 mg/dL (ref 0.61–1.24)
GFR, Estimated: >60 mL/min (ref >60)
Glucose, Bld: 102 mg/dL — ABNORMAL HIGH (ref 70–99)
Potassium: 3.5 mmol/L (ref 3.5–5.1)
Sodium: 140 mmol/L (ref 135–145)
Total Bilirubin: 0.3 mg/dL (ref 0.3–1.2)
Total Protein: 4.2 g/dL — ABNORMAL LOW (ref 6.5–8.1)

## 2021-06-18 MED ORDER — SODIUM CHLORIDE 0.9% FLUSH
10.0000 mL | INTRAVENOUS | Status: DC | PRN
  Administered 2021-06-18: 10 mL

## 2021-06-18 MED ORDER — HEPARIN SOD (PORK) LOCK FLUSH 100 UNIT/ML IV SOLN
500.0000 [IU] | Freq: Once | INTRAVENOUS | Status: AC | PRN
  Administered 2021-06-18: 500 [IU]

## 2021-06-20 NOTE — Progress Notes (Deleted)
[facility] OFFICE PROGRESS NOTE  Laurey Morale, MD [place] [address]  DIAGNOSIS: Stage IV (T2b, N2, M1c) presented with right middle lobe lung mass in addition to mediastinal lymphadenopathy as well as malignant right pleural effusion with pleural-based nodules and suspicious right hepatic lesion and the brain metastasis diagnosed in July 2020.   Molecular Biomarkers: [id number] (Exon 20 insertion) 0.2% Dacomitinib,Neratinib,Osimertinib   [id number] 0.1% None        PRIOR THERAPY: 1) Systemic chemotherapy with carboplatin for AUC of 5, Alimta 500 mg/M2 and Keytruda 200 mg IV every 3 weeks.  First dose April 04, 2019.  Status post 21 cycles  Starting from cycle #5 the patient is on maintenance treatment with Alimta and Keytruda every 3 weeks.  Starting from cycle #19 he will be on single agent Alimta.  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 2) whole brain irradiation for multiple metastatic brain lesions. 3)  Mobocertinib (Axkibity) 158 mg p.o. daily.  First dose was June 29, 2020.  Status post 6 months of treatment.  This was discontinued secondary to disease progression in the brain as well as the chest. 4) treatment was targeted therapy with Tagrisso 80 mg p.o. daily status post 7 weeks of treatment discontinued secondary to disease progression. 5) Amivantamab 350 Mg IV on day 1 followed by 1050 Mg IV on day 2 of cycle #1 started on March 12, 2021 status post 1 cycle.  Starting from cycle #2 his dose will be 1400 Mg IV weekly for 3 cycles followed by 1400 Mg IV every 2 weeks starting from cycle #5.  Status post 6 cycles.  This is discontinued secondary to disease progression.  CURRENT THERAPY: Systemic chemotherapy with docetaxel 75 Mg/M2 every 3 weeks with Neulasta support.  Status post 2 cycles started on 05/14/2021.  He did not receive Cyramza because of the recent deep venous thrombosis and  treatment with Eliquis.  INTERVAL HISTORY: Ethan Brown 70 y.o. male returns to the clinic today for a follow up visit. The patient is feeling *** today. He was recently started on systemic chemotherapy with docetaxel due to disease progression. He is tolerating it well except for fatigue. He is not a good candidate for cyramza due to the recent DVT. He recently saw vascular for the DVT who stated from a vascular standpoint, there is not a lot he has to offer besides compression and elevation. Also his low protein and malnutrition play a role. He is followed by nutrition for his hypoalbuminemia.   Overall, he is feeling fair. He denies fevers, chills, or night sweats. His weight is ***. He has a chronic cough secondary to his malignancy which he is prescribed ***. He has some shortness of breath which is ***. Denies chest pain or hemoptysis. Denies nausea, vomiting, diarrhea, or constipation. Denies peripheral neuropathy. Denies abnormal bleeding or bruising. Lovenox vs eliquis. He denies headaches or visual changes. He is due for a repeat brain MRI and follow up with 07/04/21 on. He is here for evaluation and repeat blood work before starting cycle #3.     MEDICAL HISTORY: Past Medical History:  Diagnosis Date   Borderline hypertension    Brain metastasis ([place]) dx'd 01/2019   Detached vitreous humor, left    Dyspnea    ED (erectile dysfunction) of organic origin    Hyperlipidemia    Hypertension    Lens subluxation, left    Lung cancer ([place]) dx'd 01/2019  Stage IV   Migraine headache    takes PRN Imitrex   Pseudophakia    TGA (transient global amnesia) 2017    ALLERGIES:  has No Known Allergies.  MEDICATIONS:  Current Outpatient Medications  Medication Sig Dispense Refill   amivantamab-vmjw 1,050 mg in sodium chloride 0.9 % 250 mL Inject 1,050 mg into the vein once.     apixaban (ELIQUIS) 5 MG TABS tablet Take 1 tablet (5 mg total) by mouth 2 (two) times daily. 60 tablet 3    clindamycin (CLINDAGEL) 1 % gel APPLY TOPICALLY TWICE A DAY 30 g 0   cycloSPORINE (RESTASIS) 0.05 % ophthalmic emulsion Place 1 drop into both eyes 2 (two) times daily.     dexamethasone (DECADRON) 4 MG tablet 2 tablet p.o. twice daily the day before, day of and day after the chemotherapy every 3 weeks 40 tablet 1   doxycycline (VIBRAMYCIN) 100 MG capsule Take 1 capsule (100 mg total) by mouth 2 (two) times daily. (Patient not taking: Reported on 06/17/2021) 30 capsule 1   furosemide (LASIX) 20 MG tablet TAKE 1 TABLET BY MOUTH TWICE A DAY 60 tablet 0   hydrochlorothiazide (MICROZIDE) 12.5 MG capsule TAKE 1 CAPSULE BY MOUTH EVERY DAY (Patient taking differently: Take 12.5 mg by mouth every other day.) 90 capsule 3   HYDROcodone bit-homatropine (HYCODAN) 5-1.5 MG/5ML syrup Take 5 mLs by mouth every 6 (six) hours as needed for cough. 473 mL 0   hydrocortisone 1 % lotion Apply 1 application topically 2 (two) times daily. (Patient not taking: No sig reported) 113 g 0   lidocaine-prilocaine (EMLA) cream Apply to the Port-A-Cath site 30-60 minutes before treatment 30 g 0   magic mouthwash SOLN Take 5 mLs by mouth 4 (four) times daily as needed for mouth pain. Swish and spit or swallow (Patient not taking: No sig reported) 240 mL 0   memantine (NAMENDA) 10 MG tablet TAKE 1 TABLET BY MOUTH TWICE A DAY (Patient taking differently: Take 10 mg by mouth 2 (two) times daily.) 180 tablet 2   mirtazapine (REMERON) 15 MG tablet Take 1 tablet (15 mg total) by mouth at bedtime. 30 tablet 2   mupirocin ointment (BACTROBAN) 2 % 2 (two) times daily as needed.     omeprazole (PRILOSEC) 20 MG capsule Take 20 mg by mouth daily.     prochlorperazine (COMPAZINE) 10 MG tablet Take 1 tablet (10 mg total) by mouth every 6 (six) hours as needed for nausea or vomiting. (Patient not taking: No sig reported) 30 tablet 1   sildenafil (REVATIO) 20 MG tablet TAKE AS DIRECTED (Patient not taking: No sig reported) 10 tablet 11    tamsulosin (FLOMAX) 0.4 MG CAPS capsule Take 0.4 mg by mouth daily.     No current facility-administered medications for this visit.    SURGICAL HISTORY:  Past Surgical History:  Procedure Laterality Date   CAROTID DOPPLERS  09/2017   Mild non-obstructive disease   CATARACT EXTRACTION, BILATERAL     cataract, left  2018   CHEST TUBE INSERTION Right 03/29/2019   Procedure: INSERTION PLEURAL DRAINAGE CATHETER;  Surgeon: [name], MD;  Location: [facility];  Service: Thoracic;  Laterality: Right;   COLONOSCOPY  2016   clear, repeat in 10 yrs    Eye surgeries     , Lens attachment.  Vitrectomy   FEMORAL HERNIA REPAIR     HERNIA REPAIR     IR THORACENTESIS ASP PLEURAL SPACE W/IMG GUIDE  02/22/2019  IR THORACENTESIS ASP PLEURAL SPACE W/IMG GUIDE  03/13/2019   IR THORACENTESIS ASP PLEURAL SPACE W/IMG GUIDE  03/18/2020   LUMBAR LAMINECTOMY     PLEURAL BIOPSY Right 03/29/2019   Procedure: PLEURAL BIOPSY;  Surgeon: [name], MD;  Location: [facility];  Service: Thoracic;  Laterality: Right;   PORTACATH PLACEMENT N/A 03/29/2019   Procedure: INSERTION PORT-A-CATH;  Surgeon: [name], MD;  Location: [facility];  Service: Thoracic;  Laterality: N/A;   retinal attachment, right     sclearl buckle, right     TRANSTHORACIC ECHOCARDIOGRAM  12/14/2019   EF 50 EF 55% (low normal).  Abnormal septal wall motion related to BBB; indeterminate diastolic parameters.  Mild RV enlargement with mildly reduced function.  Normal valves.  Normal atrial sizes.   victrectomy,right     VIDEO ASSISTED THORACOSCOPY Right 03/29/2019   Procedure: VIDEO ASSISTED THORACOSCOPY;  Surgeon: [name], MD;  Location: [facility] OR;  Service: Thoracic;  Laterality: Right;    REVIEW OF SYSTEMS:   Review of Systems  Constitutional: Negative for appetite change, chills, fatigue, fever and unexpected weight change.  HENT:   Negative for mouth sores, nosebleeds, sore throat and trouble swallowing.    Eyes: Negative for eye problems and icterus.  Respiratory: Negative for cough, hemoptysis, shortness of breath and wheezing.   Cardiovascular: Negative for chest pain and leg swelling.  Gastrointestinal: Negative for abdominal pain, constipation, diarrhea, nausea and vomiting.  Genitourinary: Negative for bladder incontinence, difficulty urinating, dysuria, frequency and hematuria.   Musculoskeletal: Negative for back pain, gait problem, neck pain and neck stiffness.  Skin: Negative for itching and rash.  Neurological: Negative for dizziness, extremity weakness, gait problem, headaches, light-headedness and seizures.  Hematological: Negative for adenopathy. Does not bruise/bleed easily.  Psychiatric/Behavioral: Negative for confusion, depression and sleep disturbance. The patient is not nervous/anxious.     PHYSICAL EXAMINATION:  There were no vitals taken for this visit.  ECOG PERFORMANCE STATUS: {CHL ONC ECOG [id number]  Physical Exam  Constitutional: Oriented to person, place, and time and well-developed, well-nourished, and in no distress. No distress.  HENT:  Head: Normocephalic and atraumatic.  Mouth/Throat: Oropharynx is clear and moist. No oropharyngeal exudate.  Eyes: Conjunctivae are normal. Right eye exhibits no discharge. Left eye exhibits no discharge. No scleral icterus.  Neck: Normal range of motion. Neck supple.  Cardiovascular: Normal rate, regular rhythm, normal heart sounds and intact distal pulses.   Pulmonary/Chest: Effort normal and breath sounds normal. No respiratory distress. No wheezes. No rales.  Abdominal: Soft. Bowel sounds are normal. Exhibits no distension and no mass. There is no tenderness.  Musculoskeletal: Normal range of motion. Exhibits no edema.  Lymphadenopathy:    No cervical adenopathy.  Neurological: Alert and oriented to person, place, and time. Exhibits normal muscle tone. Gait normal. Coordination normal.  Skin: Skin is warm and dry.  No rash noted. Not diaphoretic. No erythema. No pallor.  Psychiatric: Mood, memory and judgment normal.  Vitals reviewed.  LABORATORY DATA: Lab Results  Component Value Date   WBC 20.7 (H) 06/18/2021   HGB 12.4 (L) 06/18/2021   HCT 36.9 (L) 06/18/2021   MCV 94.1 06/18/2021   PLT 138 (L) 06/18/2021      Chemistry      Component Value Date/Time   NA 140 06/18/2021 1140   K 3.5 06/18/2021 1140   CL 106 06/18/2021 1140   CO2 24 06/18/2021 1140   BUN 23 06/18/2021 1140   CREATININE 0.91 06/18/2021 1140  Component Value Date/Time   CALCIUM 7.6 (L) 06/18/2021 1140   ALKPHOS 200 (H) 06/18/2021 1140   AST 24 06/18/2021 1140   ALT 26 06/18/2021 1140   BILITOT 0.3 06/18/2021 1140       RADIOGRAPHIC STUDIES:  VAS [place] LOWER EXTREMITY VENOUS (DVT)  Result Date: 06/06/2021  Lower Venous DVT Study Patient Name:  Ethan Brown [facility]  Date of Exam:   06/05/2021 Medical Rec #: [id number]              Accession #:    [id number] Date of Birth: [birth year]               Patient Gender: M Patient Age:   62 years Exam Location:  [facility] Procedure:      VAS [place] LOWER EXTREMITY VENOUS (DVT) Referring Phys: [facility] MOHAMED --------------------------------------------------------------------------------  Indications: Edema, and Follow up extensive left lower extremity DVT.  Limitations: Poor ultrasound/tissue interface. Comparison Study: 03/28/21- left lower extremity DVT involving the SFJ, CFV, FV,                   PFV, popliteal veins, posterior tibial veins, peroneal veins,                   and gastrocnemius veins. Performing Technologist: Maudry Mayhew MHA, RDMS, RVT, RDCS  Examination Guidelines: A complete evaluation includes B-mode imaging, spectral Doppler, color Doppler, and power Doppler as needed of all accessible portions of each vessel. Bilateral testing is considered an integral part of a complete examination. Limited examinations for reoccurring indications may be  performed as noted. The reflux portion of the exam is performed with the patient in reverse Trendelenburg.  +-----+---------------+---------+-----------+----------+--------------+ RIGHTCompressibilityPhasicitySpontaneityPropertiesThrombus Aging +-----+---------------+---------+-----------+----------+--------------+ CFV  Full           Yes      Yes                                 +-----+---------------+---------+-----------+----------+--------------+   +---------+---------------+---------+-----------+----------+--------------+ LEFT     CompressibilityPhasicitySpontaneityPropertiesThrombus Aging +---------+---------------+---------+-----------+----------+--------------+ CFV      Full           Yes      Yes                                 +---------+---------------+---------+-----------+----------+--------------+ SFJ      Full                                                        +---------+---------------+---------+-----------+----------+--------------+ FV Prox  None                                                        +---------+---------------+---------+-----------+----------+--------------+ FV Mid   None                    No                                  +---------+---------------+---------+-----------+----------+--------------+ FV DistalNone  No                                  +---------+---------------+---------+-----------+----------+--------------+ PFV      None                                                        +---------+---------------+---------+-----------+----------+--------------+ POP      None                    No                                  +---------+---------------+---------+-----------+----------+--------------+ PTV      None                    No                                  +---------+---------------+---------+-----------+----------+--------------+ PERO     None                     No                                  +---------+---------------+---------+-----------+----------+--------------+   Left Technical Findings: Not visualized segments include gastrocnemius veins.   Summary: RIGHT: - No evidence of common femoral vein obstruction.  LEFT: - Findings consistent with age indeterminate deep vein thrombosis involving the left femoral vein, left proximal profunda vein, left popliteal vein, left posterior tibial veins, and left peroneal veins. When compared to prior study, there is resolution of the DVT seen at the level of the CFV/SFJ, however the remaining segments of the left lower extremity demonstrate no significant change. - No cystic structure found in the popliteal fossa.  *See table(s) above for measurements and observations. Electronically signed by Orlie Pollen on 06/06/2021 at 8:21:55 AM.    Final      ASSESSMENT/PLAN:  This is a very pleasant 70 year old Caucasian male who was diagnosed with stage IV non-small cell lung cancer, adenocarcinoma with a positive EGFR resistant mutation in exon 20.  He presented with extensive disease involving the right upper lobe, right middle lobe, and right lower lobe with extensive pleural-based metastases.  He also has mediastinal and hilar adenopathy and a right malignant pleural effusion.  He also has metastatic disease to the brain and a suspicious liver lesion.  He completed SRS treatment to the metastatic disease to the brain under care of Dr. Tammi Klippel.    He had a pleurex catheter placed under the care of Dr. Roxan Hockey for his malignant pleural effusion. This was removed 07/11/2019  He then had [place] treatment to the another suspicious brain lesion on 01/23/20 under the care of Dr. Tammi Klippel as well as in November 2021.  He previously underwent systsystemic chemotherapy with carboplatin AUC of 5, Alimta 500 mg/m, and Keytruda 200 mg IV every 3 weeks.  He is status post 20 cycles. Starting from cycle #5, he had been on  maintenance Alimta 500 mg/m2 and Keytruda 200 mg IV every 3 weeks. Keytruda  was discontinued due to pneumonitis starting from cycle #17. This was discontinued after cycle #21 due to evidence for disease progression.    He had repeat PET scan and MRI of the brain performed.  The PET scan showed evidence for disease progression with metastatic disease involving the thoracic and abdominal lymph nodes as well as the right hemithorax and bones.  He also has loculated moderate right pleural effusion and small left pleural effusion.  The PET scan also showed prostate enlargement and his PSA has been rising recently.  He was seen by urology and is being followed.   He then underwent treatment with the new targeted agent Mobocertinib (Axkibity) that was recently approved by the FDA for patient with EGFR exon 20 mutation. He started this on 06/29/20. This was discontinued in May 2022 after the development of numerous new metastatic brain lesions.    He completed whole brain radiation under the care of Dr. Tammi Klippel in May 2022.   He then underwent oral targeted treatment with Tagrisso 80 mg p.o. daily. His first dose was on 01/13/21. He is status post 7 weeks of treatment. This was discontinued due to disease progression.   The patient started treatment with Amivantamab with reduced dose of 350 Mg IV on day 1 and 1050 Mg IV on day 2.  He has hypersensitivity reaction on day 1 required a lot of premedication and treatment with Solu-Medrol, Pepcid and Benadryl as well as Demerol.  He started tolerating his treatment fairly well with no concerning adverse effects starting from cycle #2. He is status post 6 cycles.  Unfortunately his scan showed evidence for disease progression with increase in bilateral pleural effusion as well as the lymphangitic spread of the tumor in the lung.  He also has worsening bone metastasis.  He is now currently undergoing systemic chemotherapy with docetaxel 75 mg/m2 with neulasta support  every 3 weeks. He is status post 2 cycles.   Labs were reviewed. Recommend he proceed with cycle #3 today as scheduled.   I will arrange for a restaging CT scan before starting cycle #4.   We will see him back for a follow up visit in 3 weeks for evaluation before starting cycle #4.   For the left lower extremity deep venous thrombosis, eliquis or lovenox.   For the recurrent right pleural effusion, will continue to monitor closely and consider The patient for thoracentesis if needed  The patient was advised to call immediately if he has any concerning symptoms in the interval. The patient voices understanding of current disease status and treatment options and is in agreement with the current care plan. All questions were answered. The patient knows to call the clinic with any problems, questions or concerns. We can certainly see the patient much sooner if necessary                    No orders of the defined types were placed in this encounter.    I spent {CHL ONC TIME VISIT - QBVQX:[id number]} counseling the patient face to face. The total time spent in the appointment was {CHL ONC TIME VISIT - KLKJZ:[id number]}.  Zackery Brine L Evanthia Maund, PA-C 06/20/21

## 2021-06-23 ENCOUNTER — Other Ambulatory Visit: Payer: Self-pay

## 2021-06-23 ENCOUNTER — Encounter: Payer: Self-pay | Admitting: Internal Medicine

## 2021-06-23 ENCOUNTER — Ambulatory Visit (HOSPITAL_BASED_OUTPATIENT_CLINIC_OR_DEPARTMENT_OTHER)
Admission: RE | Admit: 2021-06-23 | Discharge: 2021-06-23 | Disposition: A | Discharge status: Home / Self Care | Payer: Medicare Other | Source: Ambulatory Visit | Attending: Physician Assistant | Admitting: Physician Assistant

## 2021-06-23 ENCOUNTER — Other Ambulatory Visit: Payer: Self-pay | Admitting: Physician Assistant

## 2021-06-23 DIAGNOSIS — R0602 Shortness of breath: Secondary | ICD-10-CM | POA: Diagnosis not present

## 2021-06-23 DIAGNOSIS — J9811 Atelectasis: Secondary | ICD-10-CM | POA: Diagnosis not present

## 2021-06-23 DIAGNOSIS — I700 Atherosclerosis of aorta: Secondary | ICD-10-CM | POA: Diagnosis not present

## 2021-06-23 DIAGNOSIS — J90 Pleural effusion, not elsewhere classified: Secondary | ICD-10-CM | POA: Diagnosis not present

## 2021-06-23 MED ORDER — IOHEXOL 300 MG/ML  SOLN: 75.0000 mL | Freq: Once | INTRAMUSCULAR | Status: DC | PRN

## 2021-06-23 MED ORDER — IOHEXOL 300 MG/ML  SOLN
60.0000 mL | Freq: Once | INTRAMUSCULAR | Status: AC | PRN
  Administered 2021-06-23: 60 mL via INTRAVENOUS

## 2021-06-23 MED ORDER — HEPARIN SOD (PORK) LOCK FLUSH 100 UNIT/ML IV SOLN
500.0000 [IU] | Freq: Once | INTRAVENOUS | Status: AC
  Administered 2021-06-23: 500 [IU] via INTRAVENOUS

## 2021-06-23 NOTE — Telephone Encounter (Signed)
Discussed with Dr. Julien Nordmann and Cassandra PA-C, and a STAT Chest CT has been ordered for the pt. No PA was required per Tonita Phoenix.   I have called [facility] and was advised pt can go to our [facility] location now and pt will be fit into the schedule for this scan.  I have called the pt back and advised of this. Pt states he is 12min away from [facility] and is on the way now.

## 2021-06-24 ENCOUNTER — Inpatient Hospital Stay (HOSPITAL_BASED_OUTPATIENT_CLINIC_OR_DEPARTMENT_OTHER): Payer: Medicare Other | Admitting: Physician Assistant

## 2021-06-24 ENCOUNTER — Encounter: Payer: Self-pay | Admitting: Internal Medicine

## 2021-06-24 ENCOUNTER — Encounter: Payer: Self-pay | Admitting: Physician Assistant

## 2021-06-24 ENCOUNTER — Inpatient Hospital Stay: Payer: Medicare Other | Attending: Physician Assistant

## 2021-06-24 ENCOUNTER — Inpatient Hospital Stay: Payer: Medicare Other

## 2021-06-24 ENCOUNTER — Ambulatory Visit: Payer: Medicare Other

## 2021-06-24 VITALS — BP 103/73 | HR 95 | Temp 97.8°F | Resp 17 | Ht 71.0 in | Wt 220.5 lb

## 2021-06-24 DIAGNOSIS — M7989 Other specified soft tissue disorders: Secondary | ICD-10-CM | POA: Diagnosis not present

## 2021-06-24 DIAGNOSIS — C7931 Secondary malignant neoplasm of brain: Secondary | ICD-10-CM | POA: Diagnosis not present

## 2021-06-24 DIAGNOSIS — E46 Unspecified protein-calorie malnutrition: Secondary | ICD-10-CM | POA: Diagnosis not present

## 2021-06-24 DIAGNOSIS — N5089 Other specified disorders of the male genital organs: Secondary | ICD-10-CM | POA: Insufficient documentation

## 2021-06-24 DIAGNOSIS — J910 Malignant pleural effusion: Secondary | ICD-10-CM | POA: Diagnosis not present

## 2021-06-24 DIAGNOSIS — Z95828 Presence of other vascular implants and grafts: Secondary | ICD-10-CM

## 2021-06-24 DIAGNOSIS — R5383 Other fatigue: Secondary | ICD-10-CM | POA: Diagnosis not present

## 2021-06-24 DIAGNOSIS — C342 Malignant neoplasm of middle lobe, bronchus or lung: Secondary | ICD-10-CM | POA: Diagnosis not present

## 2021-06-24 DIAGNOSIS — R0602 Shortness of breath: Secondary | ICD-10-CM | POA: Insufficient documentation

## 2021-06-24 DIAGNOSIS — R635 Abnormal weight gain: Secondary | ICD-10-CM | POA: Diagnosis not present

## 2021-06-24 DIAGNOSIS — C3491 Malignant neoplasm of unspecified part of right bronchus or lung: Secondary | ICD-10-CM

## 2021-06-24 DIAGNOSIS — I700 Atherosclerosis of aorta: Secondary | ICD-10-CM | POA: Insufficient documentation

## 2021-06-24 DIAGNOSIS — Z7189 Other specified counseling: Secondary | ICD-10-CM | POA: Diagnosis not present

## 2021-06-24 DIAGNOSIS — R42 Dizziness and giddiness: Secondary | ICD-10-CM | POA: Diagnosis not present

## 2021-06-24 DIAGNOSIS — E8809 Other disorders of plasma-protein metabolism, not elsewhere classified: Secondary | ICD-10-CM | POA: Diagnosis not present

## 2021-06-24 DIAGNOSIS — C7951 Secondary malignant neoplasm of bone: Secondary | ICD-10-CM | POA: Insufficient documentation

## 2021-06-24 DIAGNOSIS — R609 Edema, unspecified: Secondary | ICD-10-CM

## 2021-06-24 DIAGNOSIS — Z79899 Other long term (current) drug therapy: Secondary | ICD-10-CM | POA: Insufficient documentation

## 2021-06-24 LAB — CBC WITH DIFFERENTIAL (CANCER CENTER ONLY)
Abs Immature Granulocytes: 0.04 10*3/uL (ref 0.00–0.07)
Basophils Absolute: 0.1 10*3/uL (ref 0.0–0.1)
Basophils Relative: 1 %
Eosinophils Absolute: 0.1 10*3/uL (ref 0.0–0.5)
Eosinophils Relative: 2 %
HCT: 35.9 % — ABNORMAL LOW (ref 39.0–52.0)
Hemoglobin: 12.2 g/dL — ABNORMAL LOW (ref 13.0–17.0)
Immature Granulocytes: 0 %
Lymphocytes Relative: 11 %
Lymphs Abs: 1 10*3/uL (ref 0.7–4.0)
MCH: 31.9 pg (ref 26.0–34.0)
MCHC: 34 g/dL (ref 30.0–36.0)
MCV: 94 fL (ref 80.0–100.0)
Monocytes Absolute: 0.6 10*3/uL (ref 0.1–1.0)
Monocytes Relative: 7 %
Neutro Abs: 7 10*3/uL (ref 1.7–7.7)
Neutrophils Relative %: 79 %
Platelet Count: 286 10*3/uL (ref 150–400)
RBC: 3.82 MIL/uL — ABNORMAL LOW (ref 4.22–5.81)
RDW: 15.7 % — ABNORMAL HIGH (ref 11.5–15.5)
WBC Count: 8.9 10*3/uL (ref 4.0–10.5)
nRBC: 0 % (ref 0.0–0.2)

## 2021-06-24 LAB — CMP (CANCER CENTER ONLY)
ALT: 27 U/L (ref 0–44)
AST: 24 U/L (ref 15–41)
Albumin: <1.5 g/dL — ABNORMAL LOW (ref 3.5–5.0)
Alkaline Phosphatase: 139 U/L — ABNORMAL HIGH (ref 38–126)
Anion gap: 4 — ABNORMAL LOW (ref 5–15)
BUN: 18 mg/dL (ref 8–23)
CO2: 29 mmol/L (ref 22–32)
Calcium: 7.4 mg/dL — ABNORMAL LOW (ref 8.9–10.3)
Chloride: 105 mmol/L (ref 98–111)
Creatinine: 0.83 mg/dL (ref 0.61–1.24)
GFR, Estimated: >60 mL/min (ref >60)
Glucose, Bld: 101 mg/dL — ABNORMAL HIGH (ref 70–99)
Potassium: 3.6 mmol/L (ref 3.5–5.1)
Sodium: 138 mmol/L (ref 135–145)
Total Bilirubin: 0.3 mg/dL (ref 0.3–1.2)
Total Protein: 4 g/dL — ABNORMAL LOW (ref 6.5–8.1)

## 2021-06-24 MED ORDER — SODIUM CHLORIDE 0.9% FLUSH
10.0000 mL | INTRAVENOUS | Status: DC | PRN
  Administered 2021-06-24: 10 mL

## 2021-06-24 MED ORDER — FUROSEMIDE 20 MG PO TABS: ORAL_TABLET | ORAL | 0 refills | Status: DC

## 2021-06-24 MED ORDER — METOLAZONE 2.5 MG PO TABS: 2.5000 mg | ORAL_TABLET | Freq: Every day | ORAL | 0 refills | Status: DC

## 2021-06-24 MED ORDER — FUROSEMIDE 20 MG PO TABS: 20.0000 mg | ORAL_TABLET | Freq: Two times a day (BID) | ORAL | 0 refills | Status: DC

## 2021-06-24 MED ORDER — POTASSIUM CHLORIDE CRYS ER 10 MEQ PO TBCR: 10.0000 meq | EXTENDED_RELEASE_TABLET | Freq: Every day | ORAL | 0 refills | Status: DC

## 2021-06-24 MED ORDER — HEPARIN SOD (PORK) LOCK FLUSH 100 UNIT/ML IV SOLN
500.0000 [IU] | Freq: Once | INTRAVENOUS | Status: AC | PRN
  Administered 2021-06-24: 500 [IU]

## 2021-06-24 NOTE — Progress Notes (Signed)
[facility] OFFICE PROGRESS NOTE  Laurey Morale, MD [place] [address]  DIAGNOSIS: Stage IV (T2b, N2, M1c) presented with right middle lobe lung mass in addition to mediastinal lymphadenopathy as well as malignant right pleural effusion with pleural-based nodules and suspicious right hepatic lesion and the brain metastasis diagnosed in July 2020.   Molecular Biomarkers: [id number] (Exon 20 insertion) 0.2% Dacomitinib,Neratinib,Osimertinib   [id number] 0.1% None        PRIOR THERAPY: 1) Systemic chemotherapy with carboplatin for AUC of 5, Alimta 500 mg/M2 and Keytruda 200 mg IV every 3 weeks.  First dose April 04, 2019.  Status post 21 cycles  Starting from cycle #5 the patient is on maintenance treatment with Alimta and Keytruda every 3 weeks.  Starting from cycle #19 he will be on single agent Alimta.  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 2) whole brain irradiation for multiple metastatic brain lesions. 3)  Mobocertinib (Axkibity) 878 mg p.o. daily.  First dose was June 29, 2020.  Status post 6 months of treatment.  This was discontinued secondary to disease progression in the brain as well as the chest. 4) treatment was targeted therapy with Tagrisso 80 mg p.o. daily status post 7 weeks of treatment discontinued secondary to disease progression. 5) Amivantamab 350 Mg IV on day 1 followed by 1050 Mg IV on day 2 of cycle #1 started on March 12, 2021 status post 1 cycle.  Starting from cycle #2 his dose will be 1400 Mg IV weekly for 3 cycles followed by 1400 Mg IV every 2 weeks starting from cycle #5.  Status post 6 cycles.  This is discontinued secondary to disease progression. 6) Systemic chemotherapy with docetaxel 75 Mg/M2 every 3 weeks with Neulasta support.  Status post 2 cycles started on 05/14/2021.  He did not receive Cyramza because of the recent deep venous thrombosis and treatment with  Eliquis. Discontinued due to disease progression.   CURRENT THERAPY: Poziotibib if clinical trial available. Otherwise Gilotrif (afatinib) 40 mg once daily. Expected to start in the next few days.   INTERVAL HISTORY: Ethan Brown 70 y.o. male returns to the clinic today for a follow up visit accompanied by his wife.  He was recently started on systemic chemotherapy with docetaxel due to disease progression. He is tolerating it fairly well except for fatigue. He is not a good candidate for cyramza due to the recent DVT. He recently saw vascular for the DVT who stated from a vascular standpoint, there is not a lot he has to offer besides compression and elevation. Also, his low protein and malnutrition play a role in his lower extremity swelling.  His albumin is <1.5 today. He is followed by nutrition for his hypoalbuminemia.   Overall, the patient has had worsening symptoms in the interval since his last appointment.  One of the patient's main concerns today is significant bilateral lower extremity swelling.  He has gained 20 pounds in the last 3 weeks since his last appointment.  He was 199 pounds at his last appointment and he currently weighs 220 lbs today.  He is having significant pitting edema from his legs all the way up to his abdomen and low back.  He notes some associated firmness in his abdomen and scrotal swelling.  He has no prior history of ascites.  He has been taking furosemide 20 mEq twice daily without significant improvement in his swelling.  He previously saw lymphedema clinic last  year for lower extremity swelling which helped.  He tries to elevate his legs, however, elevating his legs does increase his shortness of breath.  His last echo was in April 2021 which showed an ejection fraction of 50 to 55%. He has been following with nutrition.  His second concern is related to increased shortness of breath.  He has some good days and bad days.  He had a bad day yesterday.  He gets  short of breath with minimal exertion such as climbing the stairs and it takes several minutes to catch his breath and it is very uncomfortable for him.  He has been having an ongoing cough which is worse recently.  His shortness of breath is worse with lying down and he has been sleeping up in a chair recently.  Denies any chest pain or hemoptysis.  He denies fevers, chills, or night sweats. Denies nausea, vomiting, diarrhea, or constipation.  He denies headaches or visual changes but has some occasional dizzy spells.  He is scheduled for restaging brain MRI next week on 07/04/2021. He is here for evaluation and repeat blood work before starting cycle #3.     MEDICAL HISTORY: Past Medical History:  Diagnosis Date   Borderline hypertension    Brain metastasis ([place]) dx'd 01/2019   Detached vitreous humor, left    Dyspnea    ED (erectile dysfunction) of organic origin    Hyperlipidemia    Hypertension    Lens subluxation, left    Lung cancer ([facility]) dx'd 01/2019   Stage IV   Migraine headache    takes PRN Imitrex   Pseudophakia    TGA (transient global amnesia) 2017    ALLERGIES:  has No Known Allergies.  MEDICATIONS:  Current Outpatient Medications  Medication Sig Dispense Refill   metolazone (ZAROXOLYN) 2.5 MG tablet Take 1 tablet (2.5 mg total) by mouth daily. 3 tablet 0   potassium chloride SA (KLOR-CON) 10 MEQ tablet Take 1 tablet (10 mEq total) by mouth daily. 30 tablet 0   amivantamab-vmjw 1,050 mg in sodium chloride 0.9 % 250 mL Inject 1,050 mg into the vein once.     apixaban (ELIQUIS) 5 MG TABS tablet Take 1 tablet (5 mg total) by mouth 2 (two) times daily. 60 tablet 3   clindamycin (CLINDAGEL) 1 % gel APPLY TOPICALLY TWICE A DAY 30 g 0   cycloSPORINE (RESTASIS) 0.05 % ophthalmic emulsion Place 1 drop into both eyes 2 (two) times daily.     dexamethasone (DECADRON) 4 MG tablet 2 tablet p.o. twice daily the day before, day of and day after the chemotherapy every 3 weeks 40  tablet 1   furosemide (LASIX) 20 MG tablet Take 2 tablets (40 meq) in the morning and 1 tablet (20 meq) in the evening. 60 tablet 0   hydrochlorothiazide (MICROZIDE) 12.5 MG capsule TAKE 1 CAPSULE BY MOUTH EVERY DAY (Patient not taking: Reported on 06/24/2021) 90 capsule 3   HYDROcodone bit-homatropine (HYCODAN) 5-1.5 MG/5ML syrup Take 5 mLs by mouth every 6 (six) hours as needed for cough. 473 mL 0   hydrocortisone 1 % lotion Apply 1 application topically 2 (two) times daily. (Patient not taking: No sig reported) 113 g 0   lidocaine-prilocaine (EMLA) cream Apply to the Port-A-Cath site 30-60 minutes before treatment 30 g 0   magic mouthwash SOLN Take 5 mLs by mouth 4 (four) times daily as needed for mouth pain. Swish and spit or swallow (Patient not taking: No sig reported) 240 mL 0  memantine (NAMENDA) 10 MG tablet TAKE 1 TABLET BY MOUTH TWICE A DAY (Patient taking differently: Take 10 mg by mouth 2 (two) times daily.) 180 tablet 2   mirtazapine (REMERON) 15 MG tablet Take 1 tablet (15 mg total) by mouth at bedtime. 30 tablet 2   mupirocin ointment (BACTROBAN) 2 % 2 (two) times daily as needed.     omeprazole (PRILOSEC) 20 MG capsule Take 20 mg by mouth daily.     prochlorperazine (COMPAZINE) 10 MG tablet Take 1 tablet (10 mg total) by mouth every 6 (six) hours as needed for nausea or vomiting. (Patient not taking: No sig reported) 30 tablet 1   sildenafil (REVATIO) 20 MG tablet TAKE AS DIRECTED (Patient not taking: No sig reported) 10 tablet 11   tamsulosin (FLOMAX) 0.4 MG CAPS capsule Take 0.4 mg by mouth daily.     Current Facility-Administered Medications  Medication Dose Route Frequency Provider Last Rate Last Admin   sodium chloride flush (NS) 0.9 % injection 10 mL  10 mL Intracatheter PRN Curt Bears, MD   10 mL at 06/24/21 1040    SURGICAL HISTORY:  Past Surgical History:  Procedure Laterality Date   CAROTID DOPPLERS  09/2017   Mild non-obstructive disease   CATARACT  EXTRACTION, BILATERAL     cataract, left  2018   CHEST TUBE INSERTION Right 03/29/2019   Procedure: INSERTION PLEURAL DRAINAGE CATHETER;  Surgeon: [name], MD;  Location: [facility];  Service: Thoracic;  Laterality: Right;   COLONOSCOPY  2016   clear, repeat in 10 yrs    Eye surgeries     , Lens attachment.  Vitrectomy   FEMORAL HERNIA REPAIR     HERNIA REPAIR     IR THORACENTESIS ASP PLEURAL SPACE W/IMG GUIDE  02/22/2019   IR THORACENTESIS ASP PLEURAL SPACE W/IMG GUIDE  03/13/2019   IR THORACENTESIS ASP PLEURAL SPACE W/IMG GUIDE  03/18/2020   LUMBAR LAMINECTOMY     PLEURAL BIOPSY Right 03/29/2019   Procedure: PLEURAL BIOPSY;  Surgeon: [name], MD;  Location: [facility];  Service: Thoracic;  Laterality: Right;   PORTACATH PLACEMENT N/A 03/29/2019   Procedure: INSERTION PORT-A-CATH;  Surgeon: [name], MD;  Location: [facility];  Service: Thoracic;  Laterality: N/A;   retinal attachment, right     sclearl buckle, right     TRANSTHORACIC ECHOCARDIOGRAM  12/14/2019   EF 50 EF 55% (low normal).  Abnormal septal wall motion related to BBB; indeterminate diastolic parameters.  Mild RV enlargement with mildly reduced function.  Normal valves.  Normal atrial sizes.   victrectomy,right     VIDEO ASSISTED THORACOSCOPY Right 03/29/2019   Procedure: VIDEO ASSISTED THORACOSCOPY;  Surgeon: [name], MD;  Location: [facility];  Service: Thoracic;  Laterality: Right;    REVIEW OF SYSTEMS:   Review of Systems  Constitutional: Positive for fatigue and weight gain.  Negative for chills and fever. HENT:   Negative for mouth sores, nosebleeds, sore throat and trouble swallowing.   Eyes: Negative for eye problems and icterus.  Respiratory: Positive for cough and shortness of breath.  Negative for hemoptysis and wheezing.   Cardiovascular: Negative for chest pain.  Significant for bilateral lower extremities pitting edema.  Gastrointestinal: Positive for abdominal  firmness. negative for abdominal pain, constipation, diarrhea, nausea and vomiting.  Genitourinary: Negative for bladder incontinence, difficulty urinating, dysuria, frequency and hematuria.   Musculoskeletal: Negative for back pain, gait problem, neck pain and neck stiffness.  Skin: Negative for itching  and rash.  Neurological: Positive for dizziness.  Negative for extremity weakness, gait problem, headaches, light-headedness and seizures.  Hematological: Negative for adenopathy. Does not bruise/bleed easily.  Psychiatric/Behavioral: Negative for confusion, depression and sleep disturbance. The patient is not nervous/anxious.     PHYSICAL EXAMINATION:  Blood pressure 103/73, pulse 95, temperature 97.8 F (36.6 C), temperature source Oral, resp. rate 17, height '5\' 11"'  (1.803 m), weight 220 lb 8 oz (100 kg), SpO2 100 %.  ECOG PERFORMANCE STATUS: 2  Physical Exam  Constitutional: Oriented to person, place, and time and chronically ill, and in no distress.  HENT:  Head: Normocephalic and atraumatic.  Mouth/Throat: Oropharynx is clear and moist. No oropharyngeal exudate.  Eyes: Conjunctivae are normal. Right eye exhibits no discharge. Left eye exhibits no discharge. No scleral icterus.  Neck: Normal range of motion. Neck supple.  Cardiovascular: Normal rate, regular rhythm, normal heart sounds and intact distal pulses.   Pulmonary/Chest: Effort normal.  Decreased breath sounds in the left lower lung and right lower lung field.  No respiratory distress. No wheezes. No rales.  Abdominal: Firm. Bowel sounds are normal. Exhibits no distension and no mass. There is no tenderness.  Musculoskeletal: Normal range of motion.  Significant bilateral lower extremity pitting edema up until his waist. Lymphadenopathy:    No cervical adenopathy.  Neurological: Alert and oriented to person, place, and time. Exhibits normal muscle tone. Gait normal. Coordination normal.  Skin: Skin is warm and dry. No rash  noted. Not diaphoretic. No erythema. No pallor.  Psychiatric: Mood, memory and judgment normal.  Vitals reviewed.  LABORATORY DATA: Lab Results  Component Value Date   WBC 8.9 06/24/2021   HGB 12.2 (L) 06/24/2021   HCT 35.9 (L) 06/24/2021   MCV 94.0 06/24/2021   PLT 286 06/24/2021      Chemistry      Component Value Date/Time   NA 138 06/24/2021 0852   K 3.6 06/24/2021 0852   CL 105 06/24/2021 0852   CO2 29 06/24/2021 0852   BUN 18 06/24/2021 0852   CREATININE 0.83 06/24/2021 0852      Component Value Date/Time   CALCIUM 7.4 (L) 06/24/2021 0852   ALKPHOS 139 (H) 06/24/2021 0852   AST 24 06/24/2021 0852   ALT 27 06/24/2021 0852   BILITOT 0.3 06/24/2021 0852       RADIOGRAPHIC STUDIES:  CT Chest W Contrast  Result Date: 06/23/2021 CLINICAL DATA:  Lung cancer, assess treatment response, shortness of breath. EXAM: CT CHEST WITH CONTRAST TECHNIQUE: Multidetector CT imaging of the chest was performed during intravenous contrast administration. CONTRAST:  45m OMNIPAQUE IOHEXOL 300 MG/ML  SOLN COMPARISON:  05/05/2021. FINDINGS: Cardiovascular: Right IJ Port-A-Cath terminates in the low SVC. Atherosclerotic calcification of the aorta and coronary arteries. Heart size normal. No pericardial effusion. Mediastinum/Nodes: Subcentimeter right low internal jugular lymph nodes. Ill-defined lymph nodes in the mediastinum measure up to 9 mm in the low right paratracheal station, similar. Soft tissue thickening in the right hilum. Prepericardiac lymph nodes measure up to up to 10 mm and juxtadiaphragmatic lymph nodes measure up to 7 mm, similar of. No axillary adenopathy. Esophagus is grossly unremarkable. Lungs/Pleura: Continued increase in the extent of septal thickening and associated nodular consolidation throughout the right lung. Complex loculated moderate right pleural effusion with associated pleural thickening and nodularity, as before. Large simple appearing left pleural effusion,  stable. Compressive atelectasis in the left lower lobe. Patchy areas of ground-glass in the left upper lobe. Narrowing of right lower lobe  bronchi. Upper Abdomen: Visualized portions of the liver, gallbladder and adrenal glands are unremarkable. Subcentimeter low-attenuation lesions in the right kidney are too small to characterize. Visualized portions of the kidneys, spleen, pancreas, stomach and bowel are grossly unremarkable. Upper abdominal and mesenteric haziness and nodularity appears similar. Musculoskeletal: Widespread sclerotic lesions in the visualized osseous structures, similar. IMPRESSION: 1. Continue progression in septal thickening and nodular consolidation throughout the right lung, findings most indicative of lymphangitic carcinomatosis. 2. Similar osseous metastatic disease. 3. Complex loculated moderate right pleural effusion with associated pleural thickening, similar and indicative of metastatic involvement. 4. Large simple appearing left pleural effusion, stable. 5. Probable infectious/inflammatory ground-glass in the left upper lobe. 6. Aortic atherosclerosis (ICD10-I70.0). Coronary artery calcification. Electronically Signed   By: Lorin Picket M.D.   On: 06/23/2021 16:19   VAS [place] LOWER EXTREMITY VENOUS (DVT)  Result Date: 06/06/2021  Lower Venous DVT Study Patient Name:  AUDRA BELLARD [facility]  Date of Exam:   06/05/2021 Medical Rec #: [id number]              Accession #:    [id number] Date of Birth: July 01, 1951               Patient Gender: M Patient Age:   41 years Exam Location:  [facility] Procedure:      VAS [place] LOWER EXTREMITY VENOUS (DVT) Referring Phys: [facility] MOHAMED --------------------------------------------------------------------------------  Indications: Edema, and Follow up extensive left lower extremity DVT.  Limitations: Poor ultrasound/tissue interface. Comparison Study: 03/28/21- left lower extremity DVT involving the SFJ, CFV, FV,                   PFV,  popliteal veins, posterior tibial veins, peroneal veins,                   and gastrocnemius veins. Performing Technologist: Maudry Mayhew MHA, RDMS, RVT, RDCS  Examination Guidelines: A complete evaluation includes B-mode imaging, spectral Doppler, color Doppler, and power Doppler as needed of all accessible portions of each vessel. Bilateral testing is considered an integral part of a complete examination. Limited examinations for reoccurring indications may be performed as noted. The reflux portion of the exam is performed with the patient in reverse Trendelenburg.  +-----+---------------+---------+-----------+----------+--------------+ RIGHTCompressibilityPhasicitySpontaneityPropertiesThrombus Aging +-----+---------------+---------+-----------+----------+--------------+ CFV  Full           Yes      Yes                                 +-----+---------------+---------+-----------+----------+--------------+   +---------+---------------+---------+-----------+----------+--------------+ LEFT     CompressibilityPhasicitySpontaneityPropertiesThrombus Aging +---------+---------------+---------+-----------+----------+--------------+ CFV      Full           Yes      Yes                                 +---------+---------------+---------+-----------+----------+--------------+ SFJ      Full                                                        +---------+---------------+---------+-----------+----------+--------------+ FV Prox  None                                                        +---------+---------------+---------+-----------+----------+--------------+  FV Mid   None                    No                                  +---------+---------------+---------+-----------+----------+--------------+ FV DistalNone                    No                                  +---------+---------------+---------+-----------+----------+--------------+ PFV      None                                                         +---------+---------------+---------+-----------+----------+--------------+ POP      None                    No                                  +---------+---------------+---------+-----------+----------+--------------+ PTV      None                    No                                  +---------+---------------+---------+-----------+----------+--------------+ PERO     None                    No                                  +---------+---------------+---------+-----------+----------+--------------+   Left Technical Findings: Not visualized segments include gastrocnemius veins.   Summary: RIGHT: - No evidence of common femoral vein obstruction.  LEFT: - Findings consistent with age indeterminate deep vein thrombosis involving the left femoral vein, left proximal profunda vein, left popliteal vein, left posterior tibial veins, and left peroneal veins. When compared to prior study, there is resolution of the DVT seen at the level of the CFV/SFJ, however the remaining segments of the left lower extremity demonstrate no significant change. - No cystic structure found in the popliteal fossa.  *See table(s) above for measurements and observations. Electronically signed by Orlie Pollen on 06/06/2021 at 8:21:55 AM.    Final      ASSESSMENT/PLAN:  This is a very pleasant 70 year old Caucasian male who was diagnosed with stage IV non-small cell lung cancer, adenocarcinoma with a positive EGFR resistant mutation in exon 20.  He presented with extensive disease involving the right upper lobe, right middle lobe, and right lower lobe with extensive pleural-based metastases.  He also has mediastinal and hilar adenopathy and a right malignant pleural effusion. He also has metastatic disease to the brain and a suspicious liver lesion.  He completed SRS treatment to the metastatic disease to the brain under care of Dr. Tammi Klippel.    He had a  pleurex catheter placed under the care of Dr. Roxan Hockey for his malignant pleural effusion. This  was removed 07/11/2019  He then had [place] treatment to the another suspicious brain lesion on 01/23/20 under the care of Dr. Tammi Klippel as well as in November 2021.  He previously underwent systsystemic chemotherapy with carboplatin AUC of 5, Alimta 500 mg/m, and Keytruda 200 mg IV every 3 weeks.  He is status post 20 cycles. Starting from cycle #5, he had been on maintenance Alimta 500 mg/m2 and Keytruda 200 mg IV every 3 weeks. Beryle Flock was discontinued due to pneumonitis starting from cycle #17. This was discontinued after cycle #21 due to evidence for disease progression.    He had repeat PET scan and MRI of the brain performed.  The PET scan showed evidence for disease progression with metastatic disease involving the thoracic and abdominal lymph nodes as well as the right hemithorax and bones.  He also has loculated moderate right pleural effusion and small left pleural effusion.  The PET scan also showed prostate enlargement and his PSA has been rising recently.  He was seen by urology and is being followed.   He then underwent treatment with the new targeted agent Mobocertinib (Axkibity) that was recently approved by the FDA for patient with EGFR exon 20 mutation. He started this on 06/29/20. This was discontinued in May 2022 after the development of numerous new metastatic brain lesions.    He completed whole brain radiation under the care of Dr. Tammi Klippel in May 2022.   He then underwent oral targeted treatment with Tagrisso 80 mg p.o. daily. His first dose was on 01/13/21. He is status post 7 weeks of treatment. This was discontinued due to disease progression.   The patient started treatment with Amivantamab with reduced dose of 350 Mg IV on day 1 and 1050 Mg IV on day 2. He has hypersensitivity reaction on day 1 required a lot of premedication and treatment with Solu-Medrol, Pepcid and Benadryl as well  as Demerol.  He started tolerating his treatment fairly well with no concerning adverse effects starting from cycle #2. He is status post 6 cycles.  Unfortunately his scan showed evidence for disease progression with increase in bilateral pleural effusion as well as the lymphangitic spread of the tumor in the lung.  He also has worsening bone metastasis.  Patient is currently undergoing docetaxel 75 mg per metered squared with Neulasta support every 3 weeks.  He is not a good candidate for Cyramza due to his DVT.  The patient is status post 2 cycles.  The patient was endorsing increased shortness of breath and had a stat CT scan performed.  The patient was seen with Dr. Julien Nordmann today.  Dr. Julien Nordmann personally and independently reviewed the scan discussed results with the patient today.  The scan suggest interval progression of disease in the lung.  Dr. Julien Nordmann had a lengthy discussion with the patient today about his current condition and recommended treatment options.  Dr. Julien Nordmann discussed Poziotibib which is indicated for EGFR exon 20 mutation; however, this drug may not be commercially available at this time.  We will find out if the patient can be enrolled in a clinical trial at another nearby institution.  If not, Dr. Julien Nordmann would recommend starting afatinib (Gilotrif) 40 mg p.o. daily in the interval.   We will reach out to the oral chemotherapy pharmacist who will call him later this afternoon.  We will see him back for follow-up visit in 2 to 3 weeks for evaluation and repeat blood work.  Regarding the Lasix, I have sent a prescription  for metolazone 2.5 mg p.o. daily for the next 3 days to enhance diuresis.  Additionally, he was instructed to increase his furosemide to 40 mg in the a.m. and 20 mg in the p.m.  He was given a prescription for 10 mill equivalents of potassium to take daily.   We will arrange for a thoracentesis of the large left simple pleural effusion.   Arrange for a  ultrasound of the abdomen to assess for ascites.  The patient does not have any history of ascites but he has increased firmness and swelling in his abdomen per patient report.  The patient would like to hold off on being referred to the lymphedema clinic at this time.  He will continue to elevate his legs and work with a nutritionist regarding increasing his protein intake.  The patient has severe hypoalbuminemia with a albumin of less than 1.5.  Regarding the chronic lower extremity DVT, Dr. Julien Nordmann discussed possibly changing treatment to Lovenox.  The patient would like to continue on Eliquis at this time.   We performed a 6-minute walking test on the patient today to see if he qualifies for supplemental oxygen as needed.  The lowest oxygen saturation obtained was 92%.  Unfortunately, the patient does not qualify for oxygen at this time.  We will see the patient back for follow-up visit in 2 to 3 weeks for evaluation and repeat blood work.  The patient was advised to call immediately if he has any concerning symptoms in the interval. The patient voices understanding of current disease status and treatment options and is in agreement with the current care plan. All questions were answered. The patient knows to call the clinic with any problems, questions or concerns. We can certainly see the patient much sooner if necessary        Orders Placed This Encounter  Procedures   US Thoracentesis Asp Pleural space w/IMG guide    Standing Status:   Future    Standing Expiration Date:   06/24/2022    Order Specific Question:   Are labs required for specimen collection?    Answer:   No    Order Specific Question:   Reason for Exam (SYMPTOM  OR DIAGNOSIS REQUIRED)    Answer:   left sided pleural effusion, lung cancer    Order Specific Question:   Preferred imaging location?    Answer:   [facility]   US Abdomen Complete    Standing Status:   Future    Standing Expiration Date:    06/24/2022    Order Specific Question:   Reason for Exam (SYMPTOM  OR DIAGNOSIS REQUIRED)    Answer:   Assess for acities.    Order Specific Question:   Preferred imaging location?    Answer:   [place], PA-C 06/24/21  ADDENDUM: Hematology/Oncology Attending: I had a face-to-face encounter with the patient today.  I reviewed his record, lab, scan and recommended his care plan.  This is a very pleasant 70 years old white male diagnosed with a stage IV non-small cell lung cancer, adenocarcinoma with positive EGFR mutation in exon 20 diagnosed in July 2020 status post SRS to metastatic brain lesion followed by drainage of malignant right-sided pleural effusion.  The patient was then treated with systemic chemotherapy with carboplatin, Alimta and Keytruda initially for 4 cycles followed by maintenance treatment with Alimta and Keytruda status post 16 cycles.  This treatment was discontinued secondary to  disease progression. The patient was then treated with targeted therapy for EGFR exon 20 mutation with Mobocertinib for few months discontinued secondary to disease progression.  He was then treated with Tagrisso followed by Amivantamab.  Again these treatment were discontinued secondary to disease progression. He is currently undergoing systemic chemotherapy with docetaxel status post 2 cycles.  Unfortunately he has a rough time tolerating the treatment with significant edema as well as generalized weakness and fatigue.  He was complaining of worsening dyspnea and he had repeat CT scan of the chest performed yesterday.  I personally and independently reviewed the scan images and discussed the results with the patient and his wife. Unfortunately his scan showed further evidence for disease progression with worsening of the opacity in the right lung. He also has bilateral pleural effusion. He continues to have significant swelling of the lower extremities and now  up to the abdomen with suspicious ascites. I had a lengthy discussion with the patient and his wife today about his current condition and treatment options. I recommended for the patient to discontinue his current treatment with docetaxel at this point because of lack of efficiency. I discussed with the patient other treatment options and one of the option would be treatment with afatinib 40 mg p.o. daily versus referring the patient for a clinical trial with Posiotinib if there is any availability in one of the closest tertiary center. I personally contacted Dr. Durenda Hurt at [facility] in no enrollment in the clinical trial with Posiotinib at [facility] at this point. For the significant edema and hypoalbuminemia, will continue on diuresis with Lasix 40 mg p.o. every morning and 20 mg p.o. every afternoon.  We will also add Zaroxolyn 2.5 mg p.o. daily before the morning dose. For the deep venous thrombosis, the patient will continue his current treatment with Eliquis.  He was seen by vascular surgery and the suggested elevation of his legs with no change in his medication. We will see the patient back for follow-up visit and 2-3 weeks for evaluation and repeat blood work. We will also check his abdomen for any significant ascites and will consider The patient for paracentesis if needed. The patient was advised to call immediately if he has any other concerning symptoms in the interval. The total time spent in the appointment was 40 minutes.  Disclaimer: This note was dictated with voice recognition software. Similar sounding words can inadvertently be transcribed and may be missed upon review. Eilleen Kempf, MD 06/24/21

## 2021-06-24 NOTE — Patient Instructions (Addendum)
-  Selena Batten is not onsite today but she will reach out to you this afternoon to discuss afatinib if the other drug is not attainable commercially.  -Regarding the swelling, we will give you 2.5 mg of metolazone which can help with diuresis and combination with Lasix.  Metolazone is a 3-day supply.  Because Lasix and metolazone can lower your potassium, I will send you 10 mEq of potassium to take daily to your pharmacy. -I will also arrange for a ultrasound-guided thoracentesis of the left lung due to the fluid accumulation. -We will arrange for a regular ultrasound of the abdomen to assess to see if there is any fluid in the abdomen. -Please keep your appointment for the brain MRI next week as scheduled.

## 2021-06-25 ENCOUNTER — Ambulatory Visit: Payer: Medicare Other

## 2021-06-25 ENCOUNTER — Ambulatory Visit (HOSPITAL_COMMUNITY)
Admission: RE | Admit: 2021-06-25 | Discharge: 2021-06-25 | Disposition: A | Discharge status: Home / Self Care | Payer: Medicare Other | Source: Ambulatory Visit | Attending: Radiology | Admitting: Radiology

## 2021-06-25 ENCOUNTER — Inpatient Hospital Stay: Payer: Medicare Other

## 2021-06-25 ENCOUNTER — Other Ambulatory Visit: Payer: Medicare Other

## 2021-06-25 ENCOUNTER — Inpatient Hospital Stay: Payer: Medicare Other | Admitting: Physician Assistant

## 2021-06-25 ENCOUNTER — Other Ambulatory Visit: Payer: Self-pay | Admitting: Physician Assistant

## 2021-06-25 ENCOUNTER — Other Ambulatory Visit (HOSPITAL_COMMUNITY): Payer: Self-pay | Admitting: Radiology

## 2021-06-25 ENCOUNTER — Other Ambulatory Visit: Payer: Self-pay

## 2021-06-25 ENCOUNTER — Ambulatory Visit (HOSPITAL_COMMUNITY)
Admission: RE | Admit: 2021-06-25 | Discharge: 2021-06-25 | Disposition: A | Discharge status: Home / Self Care | Payer: Medicare Other | Source: Ambulatory Visit | Attending: Physician Assistant | Admitting: Physician Assistant

## 2021-06-25 ENCOUNTER — Ambulatory Visit: Payer: Medicare Other | Admitting: Internal Medicine

## 2021-06-25 ENCOUNTER — Ambulatory Visit (HOSPITAL_COMMUNITY): Payer: Medicare Other

## 2021-06-25 DIAGNOSIS — C3491 Malignant neoplasm of unspecified part of right bronchus or lung: Secondary | ICD-10-CM | POA: Insufficient documentation

## 2021-06-25 DIAGNOSIS — J90 Pleural effusion, not elsewhere classified: Secondary | ICD-10-CM | POA: Insufficient documentation

## 2021-06-25 DIAGNOSIS — R609 Edema, unspecified: Secondary | ICD-10-CM | POA: Diagnosis not present

## 2021-06-25 DIAGNOSIS — R188 Other ascites: Secondary | ICD-10-CM | POA: Diagnosis not present

## 2021-06-25 DIAGNOSIS — Z9889 Other specified postprocedural states: Secondary | ICD-10-CM

## 2021-06-25 DIAGNOSIS — J9811 Atelectasis: Secondary | ICD-10-CM | POA: Diagnosis not present

## 2021-06-25 MED ORDER — LIDOCAINE HCL 1 % IJ SOLN
INTRAMUSCULAR | Status: AC
  Administered 2021-06-25: 15 mL
  Filled 2021-06-25: qty 20

## 2021-06-25 NOTE — Procedures (Signed)
Ultrasound-guided therapeutic left thoracentesis performed yielding 1.5 liters of amber fluid. No immediate complications. Follow-up chest x-ray pending. EBL< 2 cc.

## 2021-06-26 ENCOUNTER — Encounter: Payer: Self-pay | Admitting: Internal Medicine

## 2021-06-26 ENCOUNTER — Telehealth: Payer: Self-pay | Admitting: Physician Assistant

## 2021-06-26 ENCOUNTER — Other Ambulatory Visit: Payer: Self-pay | Admitting: Physician Assistant

## 2021-06-26 DIAGNOSIS — C3491 Malignant neoplasm of unspecified part of right bronchus or lung: Secondary | ICD-10-CM

## 2021-06-26 NOTE — Telephone Encounter (Signed)
The enrollment for the Poziotinib clinical trial at [facility] is closed. I called the patient to see if he would be willing to travel to other institutions for the trial, the closest being in [place]. He is going to call over there to see if he can talk to a research specialist for more information how much travel would be involved. He will also talk to his wife and will let us know if he would like to be referred there or proceed with Afatinib here.

## 2021-06-26 NOTE — Progress Notes (Unsigned)
The following has been faxed to [facility]: [phone], [place]: [phone]  Progress notes from 06/24/21, 06/04/21 and 05/21/21, lab results, all molecular studies dated 03/29/2019, CT scans dated 06/23/21 and 05/05/21 and Brain MRI dated 04/03/21.

## 2021-06-27 ENCOUNTER — Ambulatory Visit: Payer: Medicare Other

## 2021-06-28 ENCOUNTER — Other Ambulatory Visit: Payer: Self-pay | Admitting: Internal Medicine

## 2021-06-28 DIAGNOSIS — R609 Edema, unspecified: Secondary | ICD-10-CM

## 2021-06-28 DIAGNOSIS — C3491 Malignant neoplasm of unspecified part of right bronchus or lung: Secondary | ICD-10-CM

## 2021-06-30 ENCOUNTER — Encounter: Payer: Self-pay | Admitting: *Deleted

## 2021-06-30 ENCOUNTER — Telehealth: Payer: Self-pay | Admitting: Medical Oncology

## 2021-06-30 ENCOUNTER — Encounter: Payer: Self-pay | Admitting: Internal Medicine

## 2021-06-30 ENCOUNTER — Other Ambulatory Visit: Payer: Self-pay | Admitting: Internal Medicine

## 2021-06-30 NOTE — Progress Notes (Signed)
I received a message from [facility] they need [employer] results.  I faxed with confirmation fax received.

## 2021-06-30 NOTE — Telephone Encounter (Signed)
Ethan Brown did not receive pathology and molecular reports . I re faxed the records.

## 2021-07-02 ENCOUNTER — Other Ambulatory Visit: Payer: Self-pay | Admitting: Physician Assistant

## 2021-07-02 ENCOUNTER — Other Ambulatory Visit: Payer: Self-pay

## 2021-07-02 ENCOUNTER — Inpatient Hospital Stay: Payer: Medicare Other

## 2021-07-02 DIAGNOSIS — C3491 Malignant neoplasm of unspecified part of right bronchus or lung: Secondary | ICD-10-CM

## 2021-07-02 DIAGNOSIS — C7951 Secondary malignant neoplasm of bone: Secondary | ICD-10-CM | POA: Diagnosis not present

## 2021-07-02 DIAGNOSIS — E46 Unspecified protein-calorie malnutrition: Secondary | ICD-10-CM | POA: Diagnosis not present

## 2021-07-02 DIAGNOSIS — J910 Malignant pleural effusion: Secondary | ICD-10-CM | POA: Diagnosis not present

## 2021-07-02 DIAGNOSIS — C342 Malignant neoplasm of middle lobe, bronchus or lung: Secondary | ICD-10-CM | POA: Diagnosis not present

## 2021-07-02 DIAGNOSIS — C7931 Secondary malignant neoplasm of brain: Secondary | ICD-10-CM | POA: Diagnosis not present

## 2021-07-02 DIAGNOSIS — Z95828 Presence of other vascular implants and grafts: Secondary | ICD-10-CM

## 2021-07-02 DIAGNOSIS — M7989 Other specified soft tissue disorders: Secondary | ICD-10-CM | POA: Diagnosis not present

## 2021-07-02 DIAGNOSIS — R609 Edema, unspecified: Secondary | ICD-10-CM

## 2021-07-02 LAB — CBC WITH DIFFERENTIAL (CANCER CENTER ONLY)
Abs Immature Granulocytes: 0.03 10*3/uL (ref 0.00–0.07)
Basophils Absolute: 0.1 10*3/uL (ref 0.0–0.1)
Basophils Relative: 1 %
Eosinophils Absolute: 0.3 10*3/uL (ref 0.0–0.5)
Eosinophils Relative: 3 %
HCT: 36.6 % — ABNORMAL LOW (ref 39.0–52.0)
Hemoglobin: 12.4 g/dL — ABNORMAL LOW (ref 13.0–17.0)
Immature Granulocytes: 0 %
Lymphocytes Relative: 14 %
Lymphs Abs: 1.2 10*3/uL (ref 0.7–4.0)
MCH: 31.5 pg (ref 26.0–34.0)
MCHC: 33.9 g/dL (ref 30.0–36.0)
MCV: 92.9 fL (ref 80.0–100.0)
Monocytes Absolute: 0.6 10*3/uL (ref 0.1–1.0)
Monocytes Relative: 7 %
Neutro Abs: 6.2 10*3/uL (ref 1.7–7.7)
Neutrophils Relative %: 75 %
Platelet Count: 257 10*3/uL (ref 150–400)
RBC: 3.94 MIL/uL — ABNORMAL LOW (ref 4.22–5.81)
RDW: 15.4 % (ref 11.5–15.5)
WBC Count: 8.3 10*3/uL (ref 4.0–10.5)
nRBC: 0 % (ref 0.0–0.2)

## 2021-07-02 LAB — CMP (CANCER CENTER ONLY)
ALT: 23 U/L (ref 0–44)
AST: 39 U/L (ref 15–41)
Albumin: 1.7 g/dL — ABNORMAL LOW (ref 3.5–5.0)
Alkaline Phosphatase: 138 U/L — ABNORMAL HIGH (ref 38–126)
Anion gap: 8 (ref 5–15)
BUN: 21 mg/dL (ref 8–23)
CO2: 29 mmol/L (ref 22–32)
Calcium: 7.8 mg/dL — ABNORMAL LOW (ref 8.9–10.3)
Chloride: 101 mmol/L (ref 98–111)
Creatinine: 0.89 mg/dL (ref 0.61–1.24)
GFR, Estimated: >60 mL/min
Glucose, Bld: 95 mg/dL (ref 70–99)
Potassium: 3.5 mmol/L (ref 3.5–5.1)
Sodium: 138 mmol/L (ref 135–145)
Total Bilirubin: 0.3 mg/dL (ref 0.3–1.2)
Total Protein: 4.7 g/dL — ABNORMAL LOW (ref 6.5–8.1)

## 2021-07-02 MED ORDER — HEPARIN SOD (PORK) LOCK FLUSH 100 UNIT/ML IV SOLN
500.0000 [IU] | Freq: Once | INTRAVENOUS | Status: AC | PRN
  Administered 2021-07-02: 500 [IU]

## 2021-07-02 MED ORDER — METOLAZONE 2.5 MG PO TABS: 2.5000 mg | ORAL_TABLET | Freq: Every day | ORAL | 0 refills | Status: DC

## 2021-07-02 MED ORDER — SODIUM CHLORIDE 0.9% FLUSH
10.0000 mL | INTRAVENOUS | Status: DC | PRN
  Administered 2021-07-02: 10 mL

## 2021-07-03 ENCOUNTER — Telehealth: Payer: Self-pay

## 2021-07-03 ENCOUNTER — Other Ambulatory Visit: Payer: Self-pay | Admitting: Internal Medicine

## 2021-07-03 DIAGNOSIS — C3481 Malignant neoplasm of overlapping sites of right bronchus and lung: Secondary | ICD-10-CM | POA: Diagnosis not present

## 2021-07-03 DIAGNOSIS — C3491 Malignant neoplasm of unspecified part of right bronchus or lung: Secondary | ICD-10-CM

## 2021-07-03 NOTE — Telephone Encounter (Signed)
Dr. Elmyra Ricks at [facility] requesting Dr. Julien Nordmann contact him on his cell phone.

## 2021-07-04 ENCOUNTER — Ambulatory Visit
Admission: RE | Admit: 2021-07-04 | Discharge: 2021-07-04 | Disposition: A | Discharge status: Home / Self Care | Payer: Medicare Other | Source: Ambulatory Visit | Attending: Radiation Oncology | Admitting: Radiation Oncology

## 2021-07-04 ENCOUNTER — Other Ambulatory Visit: Payer: Self-pay

## 2021-07-04 DIAGNOSIS — C7931 Secondary malignant neoplasm of brain: Secondary | ICD-10-CM

## 2021-07-04 DIAGNOSIS — G9389 Other specified disorders of brain: Secondary | ICD-10-CM | POA: Diagnosis not present

## 2021-07-04 DIAGNOSIS — C3490 Malignant neoplasm of unspecified part of unspecified bronchus or lung: Secondary | ICD-10-CM | POA: Diagnosis not present

## 2021-07-04 MED ORDER — GADOBENATE DIMEGLUMINE 529 MG/ML IV SOLN
15.0000 mL | Freq: Once | INTRAVENOUS | Status: AC | PRN
  Administered 2021-07-04: 15 mL via INTRAVENOUS

## 2021-07-07 ENCOUNTER — Inpatient Hospital Stay: Payer: Medicare Other

## 2021-07-07 ENCOUNTER — Inpatient Hospital Stay (HOSPITAL_BASED_OUTPATIENT_CLINIC_OR_DEPARTMENT_OTHER): Payer: Medicare Other | Admitting: Internal Medicine

## 2021-07-07 ENCOUNTER — Other Ambulatory Visit: Payer: Self-pay

## 2021-07-07 VITALS — BP 123/75 | HR 91 | Temp 96.8°F | Resp 18 | Wt 203.5 lb

## 2021-07-07 DIAGNOSIS — J910 Malignant pleural effusion: Secondary | ICD-10-CM | POA: Diagnosis not present

## 2021-07-07 DIAGNOSIS — C7951 Secondary malignant neoplasm of bone: Secondary | ICD-10-CM | POA: Diagnosis not present

## 2021-07-07 DIAGNOSIS — C7931 Secondary malignant neoplasm of brain: Secondary | ICD-10-CM | POA: Diagnosis not present

## 2021-07-07 DIAGNOSIS — C342 Malignant neoplasm of middle lobe, bronchus or lung: Secondary | ICD-10-CM | POA: Diagnosis not present

## 2021-07-07 DIAGNOSIS — M7989 Other specified soft tissue disorders: Secondary | ICD-10-CM | POA: Diagnosis not present

## 2021-07-07 DIAGNOSIS — E46 Unspecified protein-calorie malnutrition: Secondary | ICD-10-CM | POA: Diagnosis not present

## 2021-07-07 NOTE — Progress Notes (Signed)
[facility] [place], [place] [address] [phone]   New Patient Evaluation  Date of Service: 07/07/21 Patient Name: Kizer Nobbe Patient MRN: [id number] Patient DOB: 02/10/1951 Provider: Ventura Sellers, MD  Identifying Statement:  Cesar Rogerson is a 70 y.o. male with Brain metastasis [facility]) who presents for initial consultation and evaluation regarding cancer associated neurologic deficits.    Referring Provider: Laurey Morale, MD [address] [place],  [place] [address]  Primary Cancer:  Oncologic History: Oncology History  Adenocarcinoma of right lung, stage 4 ([place])  03/15/2019 Initial Diagnosis   Adenocarcinoma of right lung, stage 4 ([place])   04/04/2019 - 06/03/2020 Chemotherapy   The patient had palonosetron (ALOXI) injection 0.25 mg, 0.25 mg, Intravenous,  Once, 4 of 4 cycles Administration: 0.25 mg (04/04/2019), 0.25 mg (05/16/2019), 0.25 mg (06/06/2019), 0.25 mg (04/25/2019) PEMEtrexed (ALIMTA) 1,000 mg in sodium chloride 0.9 % 100 mL chemo infusion, 475 mg/m2 = 1,050 mg, Intravenous,  Once, 21 of 26 cycles Dose modification: 475 mg/m2 (original dose 500 mg/m2, Cycle 6, Reason: Other (see comments), Comment: dose rounded for vial size) Administration: 1,000 mg (04/04/2019), 1,000 mg (05/16/2019), 1,000 mg (06/06/2019), 1,000 mg (06/27/2019), 1,000 mg (07/18/2019), 1,000 mg (04/25/2019), 1,000 mg (08/08/2019), 1,000 mg (08/29/2019), 1,000 mg (09/19/2019), 1,000 mg (10/10/2019), 1,000 mg (10/31/2019), 1,000 mg (11/21/2019), 1,000 mg (12/12/2019), 1,000 mg (01/02/2020), 1,000 mg (01/23/2020), 1,000 mg (02/13/2020), 1,000 mg (03/12/2020), 1,000 mg (04/02/2020), 1,000 mg (04/23/2020), 1,000 mg (05/14/2020), 1,000 mg (06/03/2020) CARBOplatin (PARAPLATIN) 530 mg in sodium chloride 0.9 % 250 mL chemo infusion, 530 mg (100 % of original dose 527 mg), Intravenous,  Once, 4 of 4 cycles Dose modification: 527 mg (original dose 527 mg,  Cycle 1), 567.5 mg (original dose 567.5 mg, Cycle 3), 563 mg (original dose 563 mg, Cycle 4), 520 mg (original dose 520 mg, Cycle 2) Administration: 530 mg (04/04/2019), 570 mg (05/16/2019), 560 mg (06/06/2019), 520 mg (04/25/2019) pembrolizumab (KEYTRUDA) 200 mg in sodium chloride 0.9 % 50 mL chemo infusion, 200 mg, Intravenous, Once, 17 of 17 cycles Administration: 200 mg (04/04/2019), 200 mg (05/16/2019), 200 mg (06/06/2019), 200 mg (06/27/2019), 200 mg (07/18/2019), 200 mg (04/25/2019), 200 mg (08/08/2019), 200 mg (08/29/2019), 200 mg (09/19/2019), 200 mg (10/10/2019), 200 mg (10/31/2019), 200 mg (11/21/2019), 200 mg (12/12/2019), 200 mg (01/02/2020), 200 mg (01/23/2020), 200 mg (02/13/2020), 200 mg (04/02/2020) fosaprepitant (EMEND) 150 mg, dexamethasone (DECADRON) 12 mg in sodium chloride 0.9 % 145 mL IVPB, , Intravenous,  Once, 4 of 4 cycles Administration:  (04/04/2019),  (05/16/2019),  (06/06/2019),  (04/25/2019)   for chemotherapy treatment.     10/28/2020 Cancer Staging   Staging form: Lung, AJCC 8th Edition - Clinical: Stage IVB (cT2b, cN2, cM1c) - Signed by Curt Bears, MD on 10/28/2020    03/12/2021 - 04/23/2021 Chemotherapy          05/14/2021 -  Chemotherapy   Patient is on Treatment Plan : LUNG Docetaxel q21d      CNS Oncologic History 04/07/19: SRS R parietal (MM) 01/23/20: SRS R thalamus (MM) 07/03/20: SRSx2 (MM) 12/31/20: WBRT 30/10  History of Present Illness: The patient's records from the referring physician were obtained and reviewed and the patient interviewed to confirm this HPI.  Ahad Colarusso presents today for follow up after recent MRI brain.  He describes some increased difficulty with cognition, memory.  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  That said, he is still independent and functional.  No new neurologic complaints.  Lymphedema and shortness of breath from lung fluid continue to be an issue.  He was turned down for  clinical trial at [facility], will be getting salvage targeted therapy here with Dr. Julien Nordmann.  Medications: Current Outpatient Medications on File Prior to Visit  Medication Sig Dispense Refill   amivantamab-vmjw 1,050 mg in sodium chloride 0.9 % 250 mL Inject 1,050 mg into the vein once.     apixaban (ELIQUIS) 5 MG TABS tablet Take 1 tablet (5 mg total) by mouth 2 (two) times daily. 60 tablet 3   clindamycin (CLINDAGEL) 1 % gel APPLY TO AFFECTED AREA TWICE A DAY 30 g 0   cycloSPORINE (RESTASIS) 0.05 % ophthalmic emulsion Place 1 drop into both eyes 2 (two) times daily.     dexamethasone (DECADRON) 4 MG tablet 2 tablet p.o. twice daily the day before, day of and day after the chemotherapy every 3 weeks 40 tablet 1   furosemide (LASIX) 20 MG tablet 2 Tab in AM and one tab PM 90 tablet 0   hydrochlorothiazide (MICROZIDE) 12.5 MG capsule TAKE 1 CAPSULE BY MOUTH EVERY DAY (Patient not taking: Reported on 06/24/2021) 90 capsule 3   HYDROcodone bit-homatropine (HYCODAN) 5-1.5 MG/5ML syrup Take 5 mLs by mouth every 6 (six) hours as needed for cough. 473 mL 0   hydrocortisone 1 % lotion Apply 1 application topically 2 (two) times daily. (Patient not taking: No sig reported) 113 g 0   lidocaine-prilocaine (EMLA) cream Apply to the Port-A-Cath site 30-60 minutes before treatment 30 g 0   magic mouthwash SOLN Take 5 mLs by mouth 4 (four) times daily as needed for mouth pain. Swish and spit or swallow (Patient not taking: No sig reported) 240 mL 0   memantine (NAMENDA) 10 MG tablet TAKE 1 TABLET BY MOUTH TWICE A DAY (Patient taking differently: Take 10 mg by mouth 2 (two) times daily.) 180 tablet 2   metolazone (ZAROXOLYN) 2.5 MG tablet Take 1 tablet (2.5 mg total) by mouth daily. 3 tablet 0   mirtazapine (REMERON) 15 MG tablet Take 1 tablet (15 mg total) by mouth at bedtime. 30 tablet 2   mupirocin ointment (BACTROBAN) 2 % 2 (two) times daily as needed.     omeprazole (PRILOSEC) 20 MG capsule Take 20 mg by mouth  daily.     potassium chloride SA (KLOR-CON) 10 MEQ tablet Take 1 tablet (10 mEq total) by mouth daily. 30 tablet 0   prochlorperazine (COMPAZINE) 10 MG tablet Take 1 tablet (10 mg total) by mouth every 6 (six) hours as needed for nausea or vomiting. (Patient not taking: No sig reported) 30 tablet 1   sildenafil (REVATIO) 20 MG tablet TAKE AS DIRECTED (Patient not taking: No sig reported) 10 tablet 11   tamsulosin (FLOMAX) 0.4 MG CAPS capsule Take 0.4 mg by mouth daily.     No current facility-administered medications on file prior to visit.    Allergies: No Known Allergies Past Medical History:  Past Medical History:  Diagnosis Date   Borderline hypertension    Brain metastasis ([place]) dx'd 01/2019   Detached vitreous humor, left    Dyspnea    ED (erectile dysfunction) of organic origin    Hyperlipidemia    Hypertension    Lens subluxation, left    Lung cancer ([place]) dx'd 01/2019   Stage IV   Migraine headache    takes PRN Imitrex   Pseudophakia  TGA (transient global amnesia) 2017   Past Surgical History:  Past Surgical History:  Procedure Laterality Date   CAROTID DOPPLERS  09/2017   Mild non-obstructive disease   CATARACT EXTRACTION, BILATERAL     cataract, left  2018   CHEST TUBE INSERTION Right 03/29/2019   Procedure: INSERTION PLEURAL DRAINAGE CATHETER;  Surgeon: [name], MD;  Location: [facility];  Service: Thoracic;  Laterality: Right;   COLONOSCOPY  2016   clear, repeat in 10 yrs    Eye surgeries     , Lens attachment.  Vitrectomy   FEMORAL HERNIA REPAIR     HERNIA REPAIR     IR THORACENTESIS ASP PLEURAL SPACE W/IMG GUIDE  02/22/2019   IR THORACENTESIS ASP PLEURAL SPACE W/IMG GUIDE  03/13/2019   IR THORACENTESIS ASP PLEURAL SPACE W/IMG GUIDE  03/18/2020   LUMBAR LAMINECTOMY     PLEURAL BIOPSY Right 03/29/2019   Procedure: PLEURAL BIOPSY;  Surgeon: [name], MD;  Location: [facility];  Service: Thoracic;  Laterality: Right;   PORTACATH PLACEMENT  N/A 03/29/2019   Procedure: INSERTION PORT-A-CATH;  Surgeon: [name], MD;  Location: [facility];  Service: Thoracic;  Laterality: N/A;   retinal attachment, right     sclearl buckle, right     TRANSTHORACIC ECHOCARDIOGRAM  12/14/2019   EF 50 EF 55% (low normal).  Abnormal septal wall motion related to BBB; indeterminate diastolic parameters.  Mild RV enlargement with mildly reduced function.  Normal valves.  Normal atrial sizes.   victrectomy,right     VIDEO ASSISTED THORACOSCOPY Right 03/29/2019   Procedure: VIDEO ASSISTED THORACOSCOPY;  Surgeon: [name], MD;  Location: [facility];  Service: Thoracic;  Laterality: Right;   Social History:  Social History   Socioeconomic History   Marital status: Married    Spouse name: Gretchen   Number of children: 3   Years of education: Not on file   Highest education level: Master's degree (e.g., MA, MS, MEng, MEd, MSW, MBA)  Occupational History   Occupation: retired Optometrist  Tobacco Use   Smoking status: Never   Smokeless tobacco: Never  Vaping Use   Vaping Use: Never used  Substance and Sexual Activity   Alcohol use: Yes    Alcohol/week: 1.0 standard drink    Types: 1 Glasses of wine per week   Drug use: No   Sexual activity: Not Currently  Other Topics Concern   Not on file  Social History Narrative   Married father of 93, grandfather 4.  Lives with his wife, [name] they recently moved to [place] after living in [place].      He is a retired Engineer, maintenance (IT)   Was previously on an exercise regimen at [employer] 3 days a week for 90 minutes at a time --> he is now hoping to get back into that plan, but is currently now riding his bike at least 30 to 40 minutes a day for 5 days a week and then doing longer walks about 6 days a week.   Social Determinants of Health   Financial Resource Strain: Not on file  Food Insecurity: Not on file  Transportation Needs: Not on file  Physical Activity: Not on file   Stress: Not on file  Social Connections: Not on file  Intimate Partner Violence: Not on file   Family History:  Family History  Problem Relation Age of Onset   Alcohol abuse Mother    Diabetes Father    Hypertension Father  Prostate cancer Father    Cancer Father    Heart attack Maternal Grandfather 51    Review of Systems: Constitutional: Doesn't report fevers, chills or abnormal weight loss Eyes: Doesn't report blurriness of vision Ears, nose, mouth, throat, and face: Doesn't report sore throat Respiratory: Doesn't report cough, dyspnea or wheezes Cardiovascular: Doesn't report palpitation, chest discomfort  Gastrointestinal:  Doesn't report nausea, constipation, diarrhea GU: Doesn't report incontinence Skin: Doesn't report skin rashes Neurological: Per HPI Musculoskeletal: Doesn't report joint pain Behavioral/Psych: Doesn't report anxiety  Physical Exam: Vitals:   07/07/21 1139  BP: 123/75  Pulse: 91  Resp: 18  Temp: (!) 96.8 F (36 C)  SpO2: 100%   KPS: 70. General: Alert, cooperative, pleasant, in no acute distress Head: Normal EENT: No conjunctival injection or scleral icterus.  Lungs: Resp effort normal at rest Cardiac: Regular rate Abdomen: Non-distended abdomen Skin: No rashes cyanosis or petechiae. Extremities: LE edema  Neurologic Exam: Mental Status: Awake, alert, attentive to examiner. Oriented to self and environment. Language is fluent with intact comprehension.  Impaired short term recall. Cranial Nerves: Visual acuity is grossly normal. Visual fields are full. Extra-ocular movements intact. No ptosis. Face is symmetric Motor: Tone and bulk are normal. Power is full in both arms and legs. Reflexes are symmetric, no pathologic reflexes present.  Sensory: Intact to light touch Gait: Dystaxia 2/2 lymphedema   Labs: I have reviewed the data as listed    Component Value Date/Time   NA 138 07/02/2021 1139   K 3.5 07/02/2021 1139   CL 101  07/02/2021 1139   CO2 29 07/02/2021 1139   GLUCOSE 95 07/02/2021 1139   BUN 21 07/02/2021 1139   CREATININE 0.89 07/02/2021 1139   CALCIUM 7.8 (L) 07/02/2021 1139   PROT 4.7 (L) 07/02/2021 1139   ALBUMIN 1.7 (L) 07/02/2021 1139   AST 39 07/02/2021 1139   ALT 23 07/02/2021 1139   ALKPHOS 138 (H) 07/02/2021 1139   BILITOT 0.3 07/02/2021 1139   GFRNONAA >60 07/02/2021 1139   GFRAA >60 05/14/2020 0954   Lab Results  Component Value Date   WBC 8.3 07/02/2021   NEUTROABS 6.2 07/02/2021   HGB 12.4 (L) 07/02/2021   HCT 36.6 (L) 07/02/2021   MCV 92.9 07/02/2021   PLT 257 07/02/2021    Imaging:  DG Chest 1 View  Result Date: 06/25/2021 CLINICAL DATA:  Post LEFT thoracentesis EXAM: CHEST  1 VIEW COMPARISON:  05/09/2021 Correlation: CT chest 06/23/2021 FINDINGS: RIGHT subclavian Port-A-Cath with tip projecting over SVC. Normal heart size, mediastinal contours, and pulmonary vascularity. RIGHT pleural effusion and basilar atelectasis again identified. Scattered infiltrates RIGHT lung slightly increased in RIGHT upper lobe since previous exam. No pneumothorax post thoracentesis. Central bronchitic changes, LEFT lung otherwise clear. Suspected LEFT nipple shadow, no pulmonary nodule in lower LEFT lung on recent CT. IMPRESSION: No pneumothorax following LEFT thoracentesis. Electronically Signed   By: Lavonia Dana M.D.   On: 06/25/2021 11:23   CT Chest W Contrast  Result Date: 06/23/2021 CLINICAL DATA:  Lung cancer, assess treatment response, shortness of breath. EXAM: CT CHEST WITH CONTRAST TECHNIQUE: Multidetector CT imaging of the chest was performed during intravenous contrast administration. CONTRAST:  22mL OMNIPAQUE IOHEXOL 300 MG/ML  SOLN COMPARISON:  05/05/2021. FINDINGS: Cardiovascular: Right IJ Port-A-Cath terminates in the low SVC. Atherosclerotic calcification of the aorta and coronary arteries. Heart size normal. No pericardial effusion. Mediastinum/Nodes: Subcentimeter right low  internal jugular lymph nodes. Ill-defined lymph nodes in the mediastinum measure up to 9  mm in the low right paratracheal station, similar. Soft tissue thickening in the right hilum. Prepericardiac lymph nodes measure up to up to 10 mm and juxtadiaphragmatic lymph nodes measure up to 7 mm, similar of. No axillary adenopathy. Esophagus is grossly unremarkable. Lungs/Pleura: Continued increase in the extent of septal thickening and associated nodular consolidation throughout the right lung. Complex loculated moderate right pleural effusion with associated pleural thickening and nodularity, as before. Large simple appearing left pleural effusion, stable. Compressive atelectasis in the left lower lobe. Patchy areas of ground-glass in the left upper lobe. Narrowing of right lower lobe bronchi. Upper Abdomen: Visualized portions of the liver, gallbladder and adrenal glands are unremarkable. Subcentimeter low-attenuation lesions in the right kidney are too small to characterize. Visualized portions of the kidneys, spleen, pancreas, stomach and bowel are grossly unremarkable. Upper abdominal and mesenteric haziness and nodularity appears similar. Musculoskeletal: Widespread sclerotic lesions in the visualized osseous structures, similar. IMPRESSION: 1. Continue progression in septal thickening and nodular consolidation throughout the right lung, findings most indicative of lymphangitic carcinomatosis. 2. Similar osseous metastatic disease. 3. Complex loculated moderate right pleural effusion with associated pleural thickening, similar and indicative of metastatic involvement. 4. Large simple appearing left pleural effusion, stable. 5. Probable infectious/inflammatory ground-glass in the left upper lobe. 6. Aortic atherosclerosis (ICD10-I70.0). Coronary artery calcification. Electronically Signed   By: Lorin Picket M.D.   On: 06/23/2021 16:19   MR Brain W Wo Contrast  Result Date: 07/04/2021 CLINICAL DATA:   Brain/central nervous system neoplasm, surveillance. Follow-up treated metastatic lung cancer. EXAM: MRI HEAD WITHOUT AND WITH CONTRAST TECHNIQUE: Multiplanar, multiecho pulse sequences of the brain and surrounding structures were obtained without and with intravenous contrast. CONTRAST:  67mL MULTIHANCE GADOBENATE DIMEGLUMINE 529 MG/ML IV SOLN COMPARISON:  MR head 04/03/2021. FINDINGS: Brain: Numerous small enhancing metastatic lesions again identified. There is a new 5 mm left thalamic lesion (series 11, image 87). New 2 mm right insular lesion on same image. Otherwise, these are primarily stable or have decreased in size. This includes lesions that were larger on the prior study. For example, right pontine lesion (series 11, image 48) has decreased in size. Left temporal lesion (series 11, image 62) has substantially decreased in size. Left parasagittal frontal lesion (series 11, image 134) has substantially decreased in size. There is no acute infarction or intracranial hemorrhage. There is no hydrocephalus or extra-axial fluid collection. Ventricles and sulci are stable in size and configuration. Patchy foci of T2 hyperintensity in the supratentorial white matter are nonspecific but may reflect mild chronic microvascular ischemic changes. Scattered foci of chronic blood products. Vascular: Major vessel flow voids at the skull base are preserved. Skull and upper cervical spine: Normal marrow signal is preserved. Sinuses/Orbits: Mild mucosal thickening. Bilateral lens replacements. Other: Sella is unremarkable.  Patchy mastoid fluid opacification. IMPRESSION: New small left thalamic and right insular metastases. Previously seen lesions are stable or have decreased in size. Electronically Signed   By: Macy Mis M.D.   On: 07/04/2021 15:59   [place] ASCITES (ABDOMEN LIMITED)  Result Date: 06/25/2021 CLINICAL DATA:  History of right lung adenocarcinoma. Abdominal swelling. Assess for ascites. EXAM: LIMITED  ABDOMEN ULTRASOUND FOR ASCITES TECHNIQUE: Limited ultrasound survey for ascites was performed in all four abdominal quadrants. COMPARISON:  CT, 05/05/2021. FINDINGS: Sonographic imaging of the peritoneal cavity was performed. There is no ascites. IMPRESSION: No sonographic evidence of ascites. Electronically Signed   By: Lajean Manes M.D.   On: 06/25/2021 10:48   US Thoracentesis Asp  Pleural space w/IMG guide  Result Date: 06/25/2021 INDICATION: Patient with history of stage IV lung adenocarcinoma, recurrent left pleural effusion. Request received for therapeutic left thoracentesis. EXAM: ULTRASOUND GUIDED THERAPEUTIC LEFT THORACENTESIS MEDICATIONS: 10 mL 1% lidocaine COMPLICATIONS: None immediate. PROCEDURE: An ultrasound guided thoracentesis was thoroughly discussed with the patient and questions answered. The benefits, risks, alternatives and complications were also discussed. The patient understands and wishes to proceed with the procedure. Written consent was obtained. Ultrasound was performed to localize and mark an adequate pocket of fluid in the left chest. The area was then prepped and draped in the normal sterile fashion. 1% Lidocaine was used for local anesthesia. Under ultrasound guidance a 6 Fr Safe-T-Centesis catheter was introduced. Thoracentesis was performed. The catheter was removed and a dressing applied. FINDINGS: A total of approximately 1.5 liters of amber fluid was removed. IMPRESSION: Successful ultrasound guided therapeutic left thoracentesis yielding 1.5 liters of pleural fluid. Read by: Rowe Robert, PA-C Electronically Signed   By: Ruthann Cancer M.D.   On: 06/25/2021 11:56    [facility] Clinician Interpretation: I have personally reviewed the radiological images as listed.  My interpretation, in the context of the patient's clinical presentation, is progressive disease   Assessment/Plan Brain metastasis ([place] [facility])  Joevanni Roddey is clinically stable today.  Brain MRI was  reviewed in brain/spine tumor board; demonstrates 2 new small enhancing foci likely consistent with novel metastatic disease.  Physics team will fuse prior treatment plans together to make sure nothing inside prior RT field.  Recommendation is for salvage SRS to both lesions.  He is agreeable with this plan.  We additionally reviewed goals of care, nutrition guidance, exercise and their role in holding off cognitive changes from radiation.  We spent twenty additional minutes teaching regarding the natural history, biology, and historical experience in the treatment of neurologic complications of cancer.   We appreciate the opportunity to participate in the care of Naman Spychalski.  Radiation oncology team will be in contact with him regarding SRS sim and scheduling.  All questions were answered. The patient knows to call the clinic with any problems, questions or concerns. No barriers to learning were detected.  The total time spent in the encounter was 40 minutes and more than 50% was on counseling and review of test results   Ventura Sellers, MD Medical Director of Neuro-Oncology [facility] [place] 07/07/21 12:53 PM

## 2021-07-08 ENCOUNTER — Ambulatory Visit
Admission: RE | Admit: 2021-07-08 | Discharge: 2021-07-08 | Disposition: A | Discharge status: Home / Self Care | Payer: Medicare Other | Source: Ambulatory Visit | Attending: Radiation Oncology | Admitting: Radiation Oncology

## 2021-07-08 ENCOUNTER — Other Ambulatory Visit: Payer: Self-pay | Admitting: Urology

## 2021-07-08 ENCOUNTER — Encounter: Payer: Medicare Other | Admitting: Vascular Surgery

## 2021-07-08 ENCOUNTER — Encounter: Payer: Self-pay | Admitting: Internal Medicine

## 2021-07-08 VITALS — BP 108/82 | HR 87 | Temp 97.4°F | Resp 18 | Ht 71.0 in | Wt 201.0 lb

## 2021-07-08 DIAGNOSIS — Z510 Encounter for antineoplastic radiation therapy: Secondary | ICD-10-CM | POA: Diagnosis not present

## 2021-07-08 DIAGNOSIS — C3490 Malignant neoplasm of unspecified part of unspecified bronchus or lung: Secondary | ICD-10-CM | POA: Diagnosis not present

## 2021-07-08 DIAGNOSIS — C7931 Secondary malignant neoplasm of brain: Secondary | ICD-10-CM | POA: Insufficient documentation

## 2021-07-08 DIAGNOSIS — C342 Malignant neoplasm of middle lobe, bronchus or lung: Secondary | ICD-10-CM | POA: Diagnosis not present

## 2021-07-08 MED ORDER — LORAZEPAM 1 MG PO TABS: 1.0000 mg | ORAL_TABLET | ORAL | 0 refills | Status: DC | PRN

## 2021-07-08 MED ORDER — SODIUM CHLORIDE 0.9% FLUSH
10.0000 mL | Freq: Once | INTRAVENOUS | Status: AC
  Administered 2021-07-08: 10 mL via INTRAVENOUS

## 2021-07-09 ENCOUNTER — Other Ambulatory Visit: Payer: Self-pay

## 2021-07-09 ENCOUNTER — Encounter: Payer: Self-pay | Admitting: Physician Assistant

## 2021-07-09 ENCOUNTER — Inpatient Hospital Stay: Payer: Medicare Other

## 2021-07-09 DIAGNOSIS — C342 Malignant neoplasm of middle lobe, bronchus or lung: Secondary | ICD-10-CM | POA: Diagnosis not present

## 2021-07-09 DIAGNOSIS — Z95828 Presence of other vascular implants and grafts: Secondary | ICD-10-CM

## 2021-07-09 DIAGNOSIS — M7989 Other specified soft tissue disorders: Secondary | ICD-10-CM | POA: Diagnosis not present

## 2021-07-09 DIAGNOSIS — C3491 Malignant neoplasm of unspecified part of right bronchus or lung: Secondary | ICD-10-CM

## 2021-07-09 DIAGNOSIS — J910 Malignant pleural effusion: Secondary | ICD-10-CM | POA: Diagnosis not present

## 2021-07-09 DIAGNOSIS — C7951 Secondary malignant neoplasm of bone: Secondary | ICD-10-CM | POA: Diagnosis not present

## 2021-07-09 DIAGNOSIS — C7931 Secondary malignant neoplasm of brain: Secondary | ICD-10-CM | POA: Diagnosis not present

## 2021-07-09 DIAGNOSIS — E46 Unspecified protein-calorie malnutrition: Secondary | ICD-10-CM | POA: Diagnosis not present

## 2021-07-09 LAB — CBC WITH DIFFERENTIAL (CANCER CENTER ONLY)
Abs Immature Granulocytes: 0.02 10*3/uL (ref 0.00–0.07)
Basophils Absolute: 0.1 10*3/uL (ref 0.0–0.1)
Basophils Relative: 1 %
Eosinophils Absolute: 0.2 10*3/uL (ref 0.0–0.5)
Eosinophils Relative: 3 %
HCT: 35.5 % — ABNORMAL LOW (ref 39.0–52.0)
Hemoglobin: 11.9 g/dL — ABNORMAL LOW (ref 13.0–17.0)
Immature Granulocytes: 0 %
Lymphocytes Relative: 14 %
Lymphs Abs: 1 10*3/uL (ref 0.7–4.0)
MCH: 31.6 pg (ref 26.0–34.0)
MCHC: 33.5 g/dL (ref 30.0–36.0)
MCV: 94.2 fL (ref 80.0–100.0)
Monocytes Absolute: 0.5 10*3/uL (ref 0.1–1.0)
Monocytes Relative: 7 %
Neutro Abs: 5.7 10*3/uL (ref 1.7–7.7)
Neutrophils Relative %: 75 %
Platelet Count: 249 10*3/uL (ref 150–400)
RBC: 3.77 MIL/uL — ABNORMAL LOW (ref 4.22–5.81)
RDW: 15.6 % — ABNORMAL HIGH (ref 11.5–15.5)
WBC Count: 7.5 10*3/uL (ref 4.0–10.5)
nRBC: 0 % (ref 0.0–0.2)

## 2021-07-09 LAB — CMP (CANCER CENTER ONLY)
ALT: 23 U/L (ref 0–44)
AST: 23 U/L (ref 15–41)
Albumin: 2 g/dL — ABNORMAL LOW (ref 3.5–5.0)
Alkaline Phosphatase: 136 U/L — ABNORMAL HIGH (ref 38–126)
Anion gap: 10 (ref 5–15)
BUN: 14 mg/dL (ref 8–23)
CO2: 29 mmol/L (ref 22–32)
Calcium: 8.2 mg/dL — ABNORMAL LOW (ref 8.9–10.3)
Chloride: 98 mmol/L (ref 98–111)
Creatinine: 0.89 mg/dL (ref 0.61–1.24)
GFR, Estimated: >60 mL/min (ref >60)
Glucose, Bld: 98 mg/dL (ref 70–99)
Potassium: 3.2 mmol/L — ABNORMAL LOW (ref 3.5–5.1)
Sodium: 137 mmol/L (ref 135–145)
Total Bilirubin: 0.2 mg/dL — ABNORMAL LOW (ref 0.3–1.2)
Total Protein: 5.1 g/dL — ABNORMAL LOW (ref 6.5–8.1)

## 2021-07-09 MED ORDER — HEPARIN SOD (PORK) LOCK FLUSH 100 UNIT/ML IV SOLN
500.0000 [IU] | Freq: Once | INTRAVENOUS | Status: AC | PRN
  Administered 2021-07-09: 500 [IU]

## 2021-07-09 MED ORDER — SODIUM CHLORIDE 0.9% FLUSH
10.0000 mL | INTRAVENOUS | Status: DC | PRN
  Administered 2021-07-09: 10 mL

## 2021-07-10 ENCOUNTER — Telehealth: Payer: Self-pay | Admitting: Medical Oncology

## 2021-07-10 DIAGNOSIS — C7931 Secondary malignant neoplasm of brain: Secondary | ICD-10-CM | POA: Diagnosis not present

## 2021-07-10 NOTE — Telephone Encounter (Signed)
Molecular /PDl-1 faxed to [facility].

## 2021-07-11 NOTE — Progress Notes (Signed)
  Radiation Oncology         [phone] ________________________________  Name: Ethan Brown MRN: [id number]  Date: 07/08/2021  DOB: Sep 25, 1950  SIMULATION AND TREATMENT PLANNING NOTE    ICD-10-CM   1. Primary malignant neoplasm of lung with metastasis to brain (HCC)  C34.90 sodium chloride flush (NS) 0.9 % injection 10 mL   C79.31       DIAGNOSIS:  70 yo male with two new subcentimeter brain metastases from Stage IV (T2b, N2, M1c) adenocarcinoma of the right middle lobe of the lung.  NARRATIVE:  The patient was brought to the [facility].  Identity was confirmed.  All relevant records and images related to the planned course of therapy were reviewed.  The patient freely provided informed written consent to proceed with treatment after reviewing the details related to the planned course of therapy. The consent form was witnessed and verified by the simulation staff. Intravenous access was established for contrast administration. Then, the patient was set-up in a stable reproducible supine position for radiation therapy.  A relocatable thermoplastic stereotactic head frame was fabricated for precise immobilization.  CT images were obtained.  Surface markings were placed.  The CT images were loaded into the planning software and fused with the patient's targeting MRI scan.  Then the target and avoidance structures were contoured.  Treatment planning then occurred.  The radiation prescription was entered and confirmed.  I have requested 3D planning  I have requested a DVH of the following structures: Brain stem, brain, left eye, right eye, lenses, optic chiasm, target volumes, uninvolved brain, and normal tissue.    SPECIAL TREATMENT PROCEDURE:  The planned course of therapy using radiation constitutes a special treatment procedure. Special care is required in the management of this patient for the following reasons. This treatment constitutes a Special Treatment Procedure  for the following reason: High dose per fraction requiring special monitoring for increased toxicities of treatment including daily imaging.  The special nature of the planned course of radiotherapy will require increased physician supervision and oversight to ensure patient's safety with optimal treatment outcomes.  This requires extended time and effort.  PLAN:  The patient will receive 20 Gy in 1 fraction.  ________________________________  Sheral Apley Tammi Klippel, M.D.

## 2021-07-14 DIAGNOSIS — C3490 Malignant neoplasm of unspecified part of unspecified bronchus or lung: Secondary | ICD-10-CM | POA: Diagnosis not present

## 2021-07-14 DIAGNOSIS — C342 Malignant neoplasm of middle lobe, bronchus or lung: Secondary | ICD-10-CM | POA: Diagnosis not present

## 2021-07-14 DIAGNOSIS — C7931 Secondary malignant neoplasm of brain: Secondary | ICD-10-CM | POA: Diagnosis not present

## 2021-07-14 DIAGNOSIS — Z510 Encounter for antineoplastic radiation therapy: Secondary | ICD-10-CM | POA: Diagnosis not present

## 2021-07-15 ENCOUNTER — Other Ambulatory Visit: Payer: Self-pay

## 2021-07-15 ENCOUNTER — Ambulatory Visit: Payer: Medicare Other | Admitting: Radiation Oncology

## 2021-07-15 DIAGNOSIS — C3491 Malignant neoplasm of unspecified part of right bronchus or lung: Secondary | ICD-10-CM

## 2021-07-15 DIAGNOSIS — C7931 Secondary malignant neoplasm of brain: Secondary | ICD-10-CM | POA: Diagnosis not present

## 2021-07-16 ENCOUNTER — Encounter: Payer: Self-pay | Admitting: Internal Medicine

## 2021-07-16 ENCOUNTER — Ambulatory Visit (HOSPITAL_COMMUNITY)
Admission: RE | Admit: 2021-07-16 | Discharge: 2021-07-16 | Disposition: A | Discharge status: Home / Self Care | Payer: Medicare Other | Source: Ambulatory Visit | Attending: Physician Assistant | Admitting: Physician Assistant

## 2021-07-16 ENCOUNTER — Inpatient Hospital Stay: Payer: Medicare Other

## 2021-07-16 ENCOUNTER — Inpatient Hospital Stay: Payer: Medicare Other | Admitting: Dietician

## 2021-07-16 ENCOUNTER — Inpatient Hospital Stay (HOSPITAL_BASED_OUTPATIENT_CLINIC_OR_DEPARTMENT_OTHER): Payer: Medicare Other | Admitting: Internal Medicine

## 2021-07-16 ENCOUNTER — Telehealth: Payer: Self-pay | Admitting: Pharmacist

## 2021-07-16 ENCOUNTER — Other Ambulatory Visit: Payer: Self-pay | Admitting: Physician Assistant

## 2021-07-16 ENCOUNTER — Other Ambulatory Visit: Payer: Self-pay

## 2021-07-16 ENCOUNTER — Other Ambulatory Visit (HOSPITAL_COMMUNITY): Payer: Self-pay

## 2021-07-16 VITALS — BP 125/73 | HR 89 | Temp 97.6°F | Resp 18 | Ht 71.0 in | Wt 192.0 lb

## 2021-07-16 DIAGNOSIS — Z48813 Encounter for surgical aftercare following surgery on the respiratory system: Secondary | ICD-10-CM | POA: Diagnosis not present

## 2021-07-16 DIAGNOSIS — Z5111 Encounter for antineoplastic chemotherapy: Secondary | ICD-10-CM

## 2021-07-16 DIAGNOSIS — C3491 Malignant neoplasm of unspecified part of right bronchus or lung: Secondary | ICD-10-CM

## 2021-07-16 DIAGNOSIS — R0602 Shortness of breath: Secondary | ICD-10-CM | POA: Insufficient documentation

## 2021-07-16 DIAGNOSIS — Z95828 Presence of other vascular implants and grafts: Secondary | ICD-10-CM

## 2021-07-16 DIAGNOSIS — J9811 Atelectasis: Secondary | ICD-10-CM | POA: Diagnosis not present

## 2021-07-16 DIAGNOSIS — Z9889 Other specified postprocedural states: Secondary | ICD-10-CM

## 2021-07-16 DIAGNOSIS — R609 Edema, unspecified: Secondary | ICD-10-CM

## 2021-07-16 DIAGNOSIS — R059 Cough, unspecified: Secondary | ICD-10-CM | POA: Insufficient documentation

## 2021-07-16 DIAGNOSIS — J90 Pleural effusion, not elsewhere classified: Secondary | ICD-10-CM | POA: Diagnosis not present

## 2021-07-16 LAB — CBC WITH DIFFERENTIAL (CANCER CENTER ONLY)
Abs Immature Granulocytes: 0.01 10*3/uL (ref 0.00–0.07)
Basophils Absolute: 0 10*3/uL (ref 0.0–0.1)
Basophils Relative: 1 %
Eosinophils Absolute: 0.1 10*3/uL (ref 0.0–0.5)
Eosinophils Relative: 3 %
HCT: 34 % — ABNORMAL LOW (ref 39.0–52.0)
Hemoglobin: 11.5 g/dL — ABNORMAL LOW (ref 13.0–17.0)
Immature Granulocytes: 0 %
Lymphocytes Relative: 13 %
Lymphs Abs: 0.7 10*3/uL (ref 0.7–4.0)
MCH: 31.9 pg (ref 26.0–34.0)
MCHC: 33.8 g/dL (ref 30.0–36.0)
MCV: 94.2 fL (ref 80.0–100.0)
Monocytes Absolute: 0.5 10*3/uL (ref 0.1–1.0)
Monocytes Relative: 8 %
Neutro Abs: 4.3 10*3/uL (ref 1.7–7.7)
Neutrophils Relative %: 75 %
Platelet Count: 251 10*3/uL (ref 150–400)
RBC: 3.61 MIL/uL — ABNORMAL LOW (ref 4.22–5.81)
RDW: 15.7 % — ABNORMAL HIGH (ref 11.5–15.5)
WBC Count: 5.6 10*3/uL (ref 4.0–10.5)
nRBC: 0 % (ref 0.0–0.2)

## 2021-07-16 LAB — CMP (CANCER CENTER ONLY)
ALT: 22 U/L (ref 0–44)
AST: 21 U/L (ref 15–41)
Albumin: 2 g/dL — ABNORMAL LOW (ref 3.5–5.0)
Alkaline Phosphatase: 140 U/L — ABNORMAL HIGH (ref 38–126)
Anion gap: 8 (ref 5–15)
BUN: 17 mg/dL (ref 8–23)
CO2: 27 mmol/L (ref 22–32)
Calcium: 8.4 mg/dL — ABNORMAL LOW (ref 8.9–10.3)
Chloride: 103 mmol/L (ref 98–111)
Creatinine: 0.83 mg/dL (ref 0.61–1.24)
GFR, Estimated: >60 mL/min (ref >60)
Glucose, Bld: 100 mg/dL — ABNORMAL HIGH (ref 70–99)
Potassium: 3.5 mmol/L (ref 3.5–5.1)
Sodium: 138 mmol/L (ref 135–145)
Total Bilirubin: 0.4 mg/dL (ref 0.3–1.2)
Total Protein: 5.5 g/dL — ABNORMAL LOW (ref 6.5–8.1)

## 2021-07-16 MED ORDER — AFATINIB DIMALEATE 30 MG PO TABS
30.0000 mg | ORAL_TABLET | Freq: Every day | ORAL | 3 refills | Status: DC
  Filled 2021-07-16 – 2021-08-22 (×2): qty 30, 30d supply, fill #0

## 2021-07-16 MED ORDER — HEPARIN SOD (PORK) LOCK FLUSH 100 UNIT/ML IV SOLN
500.0000 [IU] | Freq: Once | INTRAVENOUS | Status: AC | PRN
  Administered 2021-07-16: 500 [IU]

## 2021-07-16 MED ORDER — LIDOCAINE HCL 1 % IJ SOLN
INTRAMUSCULAR | Status: AC
  Filled 2021-07-16: qty 20

## 2021-07-16 MED ORDER — SODIUM CHLORIDE 0.9% FLUSH
10.0000 mL | INTRAVENOUS | Status: DC | PRN
  Administered 2021-07-16: 10 mL

## 2021-07-16 NOTE — Telephone Encounter (Signed)
Oral Oncology Pharmacist Encounter  Received new prescription for Gilotrif (afatinib) for the treatment of metastatic, stage IV non-small cell lung cancer, with presence of EGFR exon 20 insertion, planned duration until disease progression or unacceptable drug toxicity.  CBC w/ Diff and CMP from 07/16/21 assessed, no relevant lab abnormalities noted. Prescription dose and frequency assessed.  Current medication list in Epic reviewed, DDIs with Gilotrif identified: Category B DDI between Gilotrif and omeprazole - there is weak data supporting that omeprazole may decrease serum concentrations of afatinib. Would recommend patient switch to Pepcid (famotidine) if able while on Gilotrif.  Evaluated chart and no patient barriers to medication adherence noted.   Patient agreement for treatment documented in MD note on 07/16/21.  Prescription has been e-scribed to the [facility] for benefits analysis and approval.  Oral Oncology Clinic will continue to follow for insurance authorization, copayment issues, initial counseling and start date.  Leron Croak, PharmD, BCPS Hematology/Oncology Clinical Pharmacist [facility] [phone] 07/16/2021 12:01 PM

## 2021-07-16 NOTE — Telephone Encounter (Addendum)
Oral Chemotherapy Pharmacist Encounter  I met with patient and patient's wife in clinic for overview of: Gilotrif (afatinib) for the treatment of metastatic, EGFR mutation-positive (exon 20 insertion) NSCLC, planned duration until disease progression or unacceptable toxicity.   Counseled patient on administration, dosing, side effects, monitoring, drug-food interactions, safe handling, storage, and disposal.  Patient will take Gilotrif 30 tablets, 1 tablet by mouth once daily, on an empty stomach, 1 hour before or 2 hours after a meal.  Gilotrif start date: 07/23/21  Adverse effects include but are not limited to: rash, diarrhea, nausea, vomiting, decreased appetitie, fatigue, mouth sores, and interstitial lung disease.   Patient will obtain anti diarrheal and alert the office of 4 or more loose stools above baseline.  Reviewed with patient importance of keeping a medication schedule and plan for any missed doses. No barriers to medication adherence identified.  Medication reconciliation performed and medication/allergy list updated.  Due to patient's high copay ($1315.62) will proceed with applying for manufacturer assistance to try and obtain Gilotrif at $0 for Mr. Thorner.   All questions answered.  Mr. Kilner voiced understanding and appreciation.   Medication education handout given to patient. Patient knows to call the office with questions or concerns. Oral Chemotherapy Clinic phone number provided to patient.   Leron Croak, PharmD, BCPS Hematology/Oncology Clinical Pharmacist [facility] [phone] 07/16/2021 1:04 PM

## 2021-07-16 NOTE — Progress Notes (Signed)
[place] Telephone:([phone]   [phone]  OFFICE PROGRESS NOTE  Laurey Morale, MD [place] [address]  DIAGNOSIS: Stage IV (T2b, N2, M1c) presented with right middle lobe lung mass in addition to mediastinal lymphadenopathy as well as malignant right pleural effusion with pleural-based nodules and suspicious right hepatic lesion and the brain metastasis diagnosed in July 2020.  Molecular Biomarkers: [id number] (Exon 20 insertion) 0.2% Dacomitinib,Neratinib,Osimertinib  [id number] 0.1% None   PRIOR THERAPY:  1) Systemic chemotherapy with carboplatin for AUC of 5, Alimta 500 mg/M2 and Keytruda 200 mg IV every 3 weeks.  First dose April 04, 2019.  Status post 21 cycles  Starting from cycle #5 the patient is on maintenance treatment with Alimta and Keytruda every 3 weeks.  Starting from cycle #19 he will be on single agent Alimta.  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 2) whole brain irradiation for multiple metastatic brain lesions. 3)  Mobocertinib (Axkibity) 017 mg p.o. daily.  First dose was June 29, 2020.  Status post 6 months of treatment.  This was discontinued secondary to disease progression in the brain as well as the chest. 4) treatment was targeted therapy with Tagrisso 80 mg p.o. daily status post 7 weeks of treatment discontinued secondary to disease progression. 5) Amivantamab 350 Mg IV on day 1 followed by 1050 Mg IV on day 2 of cycle #1 started on March 12, 2021 status post 1 cycle.  Starting from cycle #2 his dose will be 1400 Mg IV weekly for 3 cycles followed by 1400 Mg IV every 2 weeks starting from cycle #5.  Status post 6 cycles.  This is discontinued secondary to disease progression. 6) Systemic chemotherapy with docetaxel 75 Mg/M2 every 3 weeks with Neulasta support.  Status post 2 cycle started on 05/14/2021.  He did not receive Cyramza because of the recent  deep venous thrombosis and treatment with Eliquis.  This treatment was discontinued secondary to disease progression.  CURRENT THERAPY: The patient be considered for treatment with afatinib 30 mg p.o. daily to start in the next few days.   INTERVAL HISTORY: Ethan Brown 70 y.o. male returns to the clinic today for follow-up visit accompanied by his wife.  The patient continues to have the persistent fatigue, shortness of breath and dry cough.  He has been using a lot of cough medication with no improvement.  He is scheduled for ultrasound-guided left thoracentesis later today.  The swelling in the lower extremities has significantly improved with the diuretics.  He also has pain on the right hip area.  He denied having any current nausea, vomiting, diarrhea or constipation.  He has no headache or visual changes.  He was seen by Dr. Berta Minor at [facility] and he was not eligible for the clinical trial with Poziotinib.  Dr. Berta Minor was in agreement with consideration of afatinib or single agent gemcitabine. The patient is here today for evaluation and discussion of these options.    MEDICAL HISTORY: Past Medical History:  Diagnosis Date   Borderline hypertension    Brain metastasis ([place]) dx'd 01/2019   Detached vitreous humor, left    Dyspnea    ED (erectile dysfunction) of organic origin    Hyperlipidemia    Hypertension    Lens subluxation, left    Lung cancer ([place]) dx'd 01/2019   Stage IV   Migraine headache    takes PRN Imitrex   Pseudophakia  TGA (transient global amnesia) 2017    ALLERGIES:  has No Known Allergies.  MEDICATIONS:  Current Outpatient Medications  Medication Sig Dispense Refill   amivantamab-vmjw 1,050 mg in sodium chloride 0.9 % 250 mL Inject 1,050 mg into the vein once.     apixaban (ELIQUIS) 5 MG TABS tablet Take 1 tablet (5 mg total) by mouth 2 (two) times daily. 60 tablet 3   clindamycin (CLINDAGEL) 1 % gel APPLY TO AFFECTED AREA TWICE A DAY 30  g 0   cycloSPORINE (RESTASIS) 0.05 % ophthalmic emulsion Place 1 drop into both eyes 2 (two) times daily.     dexamethasone (DECADRON) 4 MG tablet 2 tablet p.o. twice daily the day before, day of and day after the chemotherapy every 3 weeks 40 tablet 1   furosemide (LASIX) 20 MG tablet 2 Tab in AM and one tab PM 90 tablet 0   hydrochlorothiazide (MICROZIDE) 12.5 MG capsule TAKE 1 CAPSULE BY MOUTH EVERY DAY (Patient not taking: Reported on 06/24/2021) 90 capsule 3   HYDROcodone bit-homatropine (HYCODAN) 5-1.5 MG/5ML syrup Take 5 mLs by mouth every 6 (six) hours as needed for cough. 473 mL 0   hydrocortisone 1 % lotion Apply 1 application topically 2 (two) times daily. (Patient not taking: No sig reported) 113 g 0   lidocaine-prilocaine (EMLA) cream Apply to the Port-A-Cath site 30-60 minutes before treatment 30 g 0   LORazepam (ATIVAN) 1 MG tablet Take 1 tablet (1 mg total) by mouth as needed for anxiety (take one tablet 30 minutes prior to treatment and may repeat once just prior to treatment if needed.). 30 tablet 0   magic mouthwash SOLN Take 5 mLs by mouth 4 (four) times daily as needed for mouth pain. Swish and spit or swallow (Patient not taking: No sig reported) 240 mL 0   memantine (NAMENDA) 10 MG tablet TAKE 1 TABLET BY MOUTH TWICE A DAY (Patient taking differently: Take 10 mg by mouth 2 (two) times daily.) 180 tablet 2   metolazone (ZAROXOLYN) 2.5 MG tablet Take 1 tablet (2.5 mg total) by mouth daily. 3 tablet 0   mirtazapine (REMERON) 15 MG tablet Take 1 tablet (15 mg total) by mouth at bedtime. 30 tablet 2   mupirocin ointment (BACTROBAN) 2 % 2 (two) times daily as needed.     omeprazole (PRILOSEC) 20 MG capsule Take 20 mg by mouth daily.     potassium chloride (KLOR-CON) 10 MEQ tablet Take 10 mEq by mouth daily.     potassium chloride SA (KLOR-CON) 10 MEQ tablet Take 1 tablet (10 mEq total) by mouth daily. 30 tablet 0   prochlorperazine (COMPAZINE) 10 MG tablet Take 1 tablet (10 mg total)  by mouth every 6 (six) hours as needed for nausea or vomiting. (Patient not taking: No sig reported) 30 tablet 1   sildenafil (REVATIO) 20 MG tablet TAKE AS DIRECTED (Patient not taking: No sig reported) 10 tablet 11   tamsulosin (FLOMAX) 0.4 MG CAPS capsule Take 0.4 mg by mouth daily.     No current facility-administered medications for this visit.    SURGICAL HISTORY:  Past Surgical History:  Procedure Laterality Date   CAROTID DOPPLERS  09/2017   Mild non-obstructive disease   CATARACT EXTRACTION, BILATERAL     cataract, left  2018   CHEST TUBE INSERTION Right 03/29/2019   Procedure: INSERTION PLEURAL DRAINAGE CATHETER;  Surgeon: [name], MD;  Location: [place];  Service: Thoracic;  Laterality: Right;   COLONOSCOPY  2016  clear, repeat in 10 yrs    Eye surgeries     , Lens attachment.  Vitrectomy   FEMORAL HERNIA REPAIR     HERNIA REPAIR     IR THORACENTESIS ASP PLEURAL SPACE W/IMG GUIDE  02/22/2019   IR THORACENTESIS ASP PLEURAL SPACE W/IMG GUIDE  03/13/2019   IR THORACENTESIS ASP PLEURAL SPACE W/IMG GUIDE  03/18/2020   LUMBAR LAMINECTOMY     PLEURAL BIOPSY Right 03/29/2019   Procedure: PLEURAL BIOPSY;  Surgeon: [name], MD;  Location: [place];  Service: Thoracic;  Laterality: Right;   PORTACATH PLACEMENT N/A 03/29/2019   Procedure: INSERTION PORT-A-CATH;  Surgeon: [name], MD;  Location: [place];  Service: Thoracic;  Laterality: N/A;   retinal attachment, right     sclearl buckle, right     TRANSTHORACIC ECHOCARDIOGRAM  12/14/2019   EF 50 EF 55% (low normal).  Abnormal septal wall motion related to BBB; indeterminate diastolic parameters.  Mild RV enlargement with mildly reduced function.  Normal valves.  Normal atrial sizes.   victrectomy,right     VIDEO ASSISTED THORACOSCOPY Right 03/29/2019   Procedure: VIDEO ASSISTED THORACOSCOPY;  Surgeon: [name], MD;  Location: [place];  Service: Thoracic;  Laterality: Right;     REVIEW OF SYSTEMS:  Constitutional: positive for fatigue Eyes: negative Ears, nose, mouth, throat, and face: negative Respiratory: positive for cough and dyspnea on exertion Cardiovascular: negative Gastrointestinal: negative Genitourinary:negative Integument/breast: negative Hematologic/lymphatic: negative Musculoskeletal:positive for arthralgias and bone pain Neurological: negative Behavioral/Psych: negative Endocrine: negative Allergic/Immunologic: negative   PHYSICAL EXAMINATION: General appearance: alert, cooperative, fatigued, and no distress Head: Normocephalic, without obvious abnormality, atraumatic Neck: no adenopathy, no JVD, supple, symmetrical, trachea midline, and thyroid not enlarged, symmetric, no tenderness/mass/nodules Lymph nodes: Cervical, supraclavicular, and axillary nodes normal. Resp: diminished breath sounds LLL and RLL and dullness to percussion LLL and RLL Back: symmetric, no curvature. ROM normal. No CVA tenderness. Cardio: regular rate and rhythm, S1, S2 normal, no murmur, click, rub or gallop GI: soft, non-tender; bowel sounds normal; no masses,  no organomegaly Extremities: edema 1+ edema in the left lower extremity Neurologic: Alert and oriented X 3, normal strength and tone. Normal symmetric reflexes. Normal coordination and gait Skin exam: Petechial rash on the forehead and the face bilaterally.  ECOG PERFORMANCE STATUS: 1 - Symptomatic but completely ambulatory  Blood pressure 125/73, pulse 89, temperature 97.6 F (36.4 C), temperature source Tympanic, resp. rate 18, height $RemoveBe'5\' 11"'UiMnRIEwt$  (1.803 m), weight 192 lb (87.1 kg), SpO2 97 %.  LABORATORY DATA: Lab Results  Component Value Date   WBC 5.6 07/16/2021   HGB 11.5 (L) 07/16/2021   HCT 34.0 (L) 07/16/2021   MCV 94.2 07/16/2021   PLT 251 07/16/2021      Chemistry      Component Value Date/Time   NA 138 07/16/2021 1013   K 3.5 07/16/2021 1013   CL 103 07/16/2021 1013   CO2 27 07/16/2021  1013   BUN 17 07/16/2021 1013   CREATININE 0.83 07/16/2021 1013      Component Value Date/Time   CALCIUM 8.4 (L) 07/16/2021 1013   ALKPHOS 140 (H) 07/16/2021 1013   AST 21 07/16/2021 1013   ALT 22 07/16/2021 1013   BILITOT 0.4 07/16/2021 1013       RADIOGRAPHIC STUDIES: DG Chest 1 View  Result Date: 06/25/2021 CLINICAL DATA:  Post LEFT thoracentesis EXAM: CHEST  1 VIEW COMPARISON:  05/09/2021 Correlation: CT chest 06/23/2021 FINDINGS: RIGHT subclavian Port-A-Cath with tip projecting  over SVC. Normal heart size, mediastinal contours, and pulmonary vascularity. RIGHT pleural effusion and basilar atelectasis again identified. Scattered infiltrates RIGHT lung slightly increased in RIGHT upper lobe since previous exam. No pneumothorax post thoracentesis. Central bronchitic changes, LEFT lung otherwise clear. Suspected LEFT nipple shadow, no pulmonary nodule in lower LEFT lung on recent CT. IMPRESSION: No pneumothorax following LEFT thoracentesis. Electronically Signed   By: Lavonia Dana M.D.   On: 06/25/2021 11:23   CT Chest W Contrast  Result Date: 06/23/2021 CLINICAL DATA:  Lung cancer, assess treatment response, shortness of breath. EXAM: CT CHEST WITH CONTRAST TECHNIQUE: Multidetector CT imaging of the chest was performed during intravenous contrast administration. CONTRAST:  57mL OMNIPAQUE IOHEXOL 300 MG/ML  SOLN COMPARISON:  05/05/2021. FINDINGS: Cardiovascular: Right IJ Port-A-Cath terminates in the low SVC. Atherosclerotic calcification of the aorta and coronary arteries. Heart size normal. No pericardial effusion. Mediastinum/Nodes: Subcentimeter right low internal jugular lymph nodes. Ill-defined lymph nodes in the mediastinum measure up to 9 mm in the low right paratracheal station, similar. Soft tissue thickening in the right hilum. Prepericardiac lymph nodes measure up to up to 10 mm and juxtadiaphragmatic lymph nodes measure up to 7 mm, similar of. No axillary adenopathy. Esophagus is  grossly unremarkable. Lungs/Pleura: Continued increase in the extent of septal thickening and associated nodular consolidation throughout the right lung. Complex loculated moderate right pleural effusion with associated pleural thickening and nodularity, as before. Large simple appearing left pleural effusion, stable. Compressive atelectasis in the left lower lobe. Patchy areas of ground-glass in the left upper lobe. Narrowing of right lower lobe bronchi. Upper Abdomen: Visualized portions of the liver, gallbladder and adrenal glands are unremarkable. Subcentimeter low-attenuation lesions in the right kidney are too small to characterize. Visualized portions of the kidneys, spleen, pancreas, stomach and bowel are grossly unremarkable. Upper abdominal and mesenteric haziness and nodularity appears similar. Musculoskeletal: Widespread sclerotic lesions in the visualized osseous structures, similar. IMPRESSION: 1. Continue progression in septal thickening and nodular consolidation throughout the right lung, findings most indicative of lymphangitic carcinomatosis. 2. Similar osseous metastatic disease. 3. Complex loculated moderate right pleural effusion with associated pleural thickening, similar and indicative of metastatic involvement. 4. Large simple appearing left pleural effusion, stable. 5. Probable infectious/inflammatory ground-glass in the left upper lobe. 6. Aortic atherosclerosis (ICD10-I70.0). Coronary artery calcification. Electronically Signed   By: Lorin Picket M.D.   On: 06/23/2021 16:19   MR Brain W Wo Contrast  Result Date: 07/04/2021 CLINICAL DATA:  Brain/central nervous system neoplasm, surveillance. Follow-up treated metastatic lung cancer. EXAM: MRI HEAD WITHOUT AND WITH CONTRAST TECHNIQUE: Multiplanar, multiecho pulse sequences of the brain and surrounding structures were obtained without and with intravenous contrast. CONTRAST:  28mL MULTIHANCE GADOBENATE DIMEGLUMINE 529 MG/ML IV SOLN  COMPARISON:  MR head 04/03/2021. FINDINGS: Brain: Numerous small enhancing metastatic lesions again identified. There is a new 5 mm left thalamic lesion (series 11, image 87). New 2 mm right insular lesion on same image. Otherwise, these are primarily stable or have decreased in size. This includes lesions that were larger on the prior study. For example, right pontine lesion (series 11, image 48) has decreased in size. Left temporal lesion (series 11, image 62) has substantially decreased in size. Left parasagittal frontal lesion (series 11, image 134) has substantially decreased in size. There is no acute infarction or intracranial hemorrhage. There is no hydrocephalus or extra-axial fluid collection. Ventricles and sulci are stable in size and configuration. Patchy foci of T2 hyperintensity in the supratentorial white matter are  nonspecific but may reflect mild chronic microvascular ischemic changes. Scattered foci of chronic blood products. Vascular: Major vessel flow voids at the skull base are preserved. Skull and upper cervical spine: Normal marrow signal is preserved. Sinuses/Orbits: Mild mucosal thickening. Bilateral lens replacements. Other: Sella is unremarkable.  Patchy mastoid fluid opacification. IMPRESSION: New small left thalamic and right insular metastases. Previously seen lesions are stable or have decreased in size. Electronically Signed   By: Macy Mis M.D.   On: 07/04/2021 15:59   [place] ASCITES (ABDOMEN LIMITED)  Result Date: 06/25/2021 CLINICAL DATA:  History of right lung adenocarcinoma. Abdominal swelling. Assess for ascites. EXAM: LIMITED ABDOMEN ULTRASOUND FOR ASCITES TECHNIQUE: Limited ultrasound survey for ascites was performed in all four abdominal quadrants. COMPARISON:  CT, 05/05/2021. FINDINGS: Sonographic imaging of the peritoneal cavity was performed. There is no ascites. IMPRESSION: No sonographic evidence of ascites. Electronically Signed   By: Lajean Manes M.D.   On:  06/25/2021 10:48   US Thoracentesis Asp Pleural space w/IMG guide  Result Date: 06/25/2021 INDICATION: Patient with history of stage IV lung adenocarcinoma, recurrent left pleural effusion. Request received for therapeutic left thoracentesis. EXAM: ULTRASOUND GUIDED THERAPEUTIC LEFT THORACENTESIS MEDICATIONS: 10 mL 1% lidocaine COMPLICATIONS: None immediate. PROCEDURE: An ultrasound guided thoracentesis was thoroughly discussed with the patient and questions answered. The benefits, risks, alternatives and complications were also discussed. The patient understands and wishes to proceed with the procedure. Written consent was obtained. Ultrasound was performed to localize and mark an adequate pocket of fluid in the left chest. The area was then prepped and draped in the normal sterile fashion. 1% Lidocaine was used for local anesthesia. Under ultrasound guidance a 6 Fr Safe-T-Centesis catheter was introduced. Thoracentesis was performed. The catheter was removed and a dressing applied. FINDINGS: A total of approximately 1.5 liters of amber fluid was removed. IMPRESSION: Successful ultrasound guided therapeutic left thoracentesis yielding 1.5 liters of pleural fluid. Read by: Rowe Robert, PA-C Electronically Signed   By: Ruthann Cancer M.D.   On: 06/25/2021 11:56     ASSESSMENT AND PLAN: This is a very pleasant 70 years old white male recently diagnosed with a stage IV (T2b, N2, M1C) non-small cell lung cancer, adenocarcinoma with positive EGFR mutation in exon 20 (resistant mutation) diagnosed in July 2020 and presented with extensive disease involving the right upper lobe as well as the right middle and lower lobe with extensive pleural based metastasis as well as mediastinal and hilar disease with malignant pleural effusion as well as liver and brain metastasis. Unfortunately the patient has no actionable mutations based on the molecular studies by [facility].   The patient is currently undergoing  systemic chemotherapy with carboplatin for AUC of 5, Alimta 500 mg/M2 and Keytruda 200 mg IV every 3 weeks status post 21 cycles.  Starting from cycle #5 the patient is on maintenance treatment with Alimta and Keytruda. Beryle Flock was discontinued after cycle #18 secondary to recurrent immunotherapy mediated pneumonitis. Starting from cycle #19 the patient will be on treatment with single agent Alimta every 3 weeks. The patient has been tolerating the treatment well. He had repeat PET scan and MRI of the brain performed recently.  The PET scan showed evidence for disease progression with metastatic disease involving the thoracic and abdominal lymph nodes as well as the right hemithorax and bones.  He also has loculated moderate right pleural effusion and small left pleural effusion.  The PET scan also showed prostate enlargement and his PSA has been rising recently.  He was seen by urology. MRI of the brain showed development of new 5 mm right temporal lesion suspicious for metastatic disease as well as new 2 mm focus of enhancement in the left periventricular white matter also suspicious for metastatic disease.  He was seen by radiation oncology and expected to have [place] to this lesion soon. The patient started treatment with Mobocertinib (Axkibity) 696 mg p.o. daily.  Status post 6 months of treatment.  He has been tolerating the treatment well with no concerning adverse effects except for the dryness of his nose that is not responding to the over-the-counter medication.  His treatment was discontinued secondary to disease progression. Unfortunately he had significant disease progression in the brain with innumerable brain metastasis in addition to previous increase in some of the pulmonary nodules seen on the previous imaging studies.  He completed a course of whole brain irradiation. The patient was treated with Tagrisso 80 mg p.o. daily status post 7 weeks of treatment discontinued secondary to disease  progression. The patient started last week the first dose of his treatment with Amivantamab with reduced dose of 350 Mg IV on day 1 and 1050 Mg IV on day 2.  He has hypersensitivity reaction on day 1 required a lot of premedication and treatment with Solu-Medrol, Pepcid and Benadryl as well as Demerol.  He started tolerating his treatment fairly well with no concerning adverse effects starting from cycle #2. He is status post 6 cycles.   The patient tolerated the last cycle of his treatment fairly well. He had repeat CT scan of the chest, abdomen pelvis performed recently.  I personally and independently reviewed the scan images and discussed results with the patient today. Unfortunately his scan showed evidence for disease progression with increase in bilateral pleural effusion as well as the lymphangitic spread of the tumor in the lung.  He also has worsening bone metastasis. He is currently undergoing systemic chemotherapy with docetaxel 75 Mg/M2 with Neulasta support every 3 weeks.  Status post 2 cycles.  This treatment was discontinued secondary to disease progression. The patient was evaluated for clinical trial at [facility] with Poziotinib but unfortunately was not a candidate for the trial.  He is here today for evaluation and discussion of further treatment options. Had a lengthy discussion with the patient and his wife today about his condition.  I gave him the option of treatment with oral afatinib that may work in the uncommon mutation.  He was also given the option of treatment with single agent gemcitabine. The patient is interested in proceeding with the oral drug and he will see the pharmacist for oral oncolytic later today for discussion of this option and education. For the recurrent bilateral pleural effusion, he is scheduled to have ultrasound-guided left thoracentesis later today and if needed will consider him for ultrasound-guided right thoracentesis in the future. For the  right hip pain, I will order MRI of the hip to rule out any painful metastatic lesion in this area that could benefit from palliative radiation. For the lower extremity edema, the patient will continue on treatment with diuretic with Lasix as well as Zaroxolyn. He will come back for follow-up visit in around 3 weeks for evaluation and repeat blood work. He was advised to call immediately if he has any other concerning symptoms in the interval.  The patient voices understanding of current disease status and treatment options and is in agreement with the current care plan. All questions were answered. The patient knows  to call the clinic with any problems, questions or concerns. We can certainly see the patient much sooner if necessary.  Disclaimer: This note was dictated with voice recognition software. Similar sounding words can inadvertently be transcribed and may not be corrected upon review.

## 2021-07-16 NOTE — Telephone Encounter (Signed)
Oral Oncology Pharmacist Encounter  Prior Authorization for [facility] has been approved.    PA# [id number] Effective dates: 07/16/21 through until further notice.   Patients co-pay is $1315.62  Oral Oncology Clinic will continue to follow.   Leron Croak, PharmD, BCPS Hematology/Oncology Clinical Pharmacist [facility] [phone] 07/16/2021 12:05 PM

## 2021-07-16 NOTE — Telephone Encounter (Signed)
Oral Oncology Pharmacist Encounter   Received notification from [facility] that prior authorization for Gilotrif is required.   PA submitted on CoverMyMeds Key: [id number] Status is pending   Oral Oncology Clinic will continue to follow.   Leron Croak, PharmD, BCPS Hematology/Oncology Clinical Pharmacist [facility] [phone] 07/16/2021 12:04 PM

## 2021-07-16 NOTE — Procedures (Signed)
PROCEDURE SUMMARY:  Successful US guided left thoracentesis. Yielded 1L of yellow fluid. Pt tolerated procedure well. No immediate complications.  Specimen not sent for labs. CXR ordered.  EBL < 5 mL  Maxwel Meadowcroft PA-C 07/16/2021 2:24 PM

## 2021-07-22 ENCOUNTER — Ambulatory Visit
Admission: RE | Admit: 2021-07-22 | Discharge: 2021-07-22 | Disposition: A | Discharge status: Home / Self Care | Payer: Medicare Other | Source: Ambulatory Visit | Attending: Radiation Oncology | Admitting: Radiation Oncology

## 2021-07-22 ENCOUNTER — Encounter: Payer: Self-pay | Admitting: Radiation Oncology

## 2021-07-22 ENCOUNTER — Encounter: Payer: Self-pay | Admitting: Urology

## 2021-07-22 ENCOUNTER — Other Ambulatory Visit: Payer: Self-pay

## 2021-07-22 VITALS — BP 124/72 | HR 97 | Temp 96.7°F | Resp 18

## 2021-07-22 DIAGNOSIS — C7931 Secondary malignant neoplasm of brain: Secondary | ICD-10-CM | POA: Diagnosis not present

## 2021-07-22 DIAGNOSIS — C3490 Malignant neoplasm of unspecified part of unspecified bronchus or lung: Secondary | ICD-10-CM | POA: Diagnosis not present

## 2021-07-22 DIAGNOSIS — Z510 Encounter for antineoplastic radiation therapy: Secondary | ICD-10-CM | POA: Diagnosis not present

## 2021-07-22 DIAGNOSIS — C342 Malignant neoplasm of middle lobe, bronchus or lung: Secondary | ICD-10-CM | POA: Diagnosis not present

## 2021-07-22 DIAGNOSIS — C7951 Secondary malignant neoplasm of bone: Secondary | ICD-10-CM

## 2021-07-22 NOTE — Progress Notes (Signed)
Nurse monitoring complete status post 1 of 1 SRS treatment. Patient without complaints. Patient denies new or worsening neurologic symptoms. Vitals stable. Instructed patient to avoid strenuous activity for the next 24 hours. Instructed patient to call [phone] with needs related to treatment after hours or over the weekend. Patient and wife verbalized understanding/agreement. Patient escorted out of clinic via wheelchair by spouse to personal vehicle without incident  Vitals:   07/22/21 1605  BP: 124/72  Pulse: 97  Resp: 18  Temp: (!) 96.7 F (35.9 C)  SpO2: 100%

## 2021-07-22 NOTE — Procedures (Signed)
  Name: Ethan Brown  MRN: [id number]  Date: 07/22/2021   DOB: Jan 28, 1951  Stereotactic Radiosurgery Operative Note  PRE-OPERATIVE DIAGNOSIS:  Multiple Brain Metastases  POST-OPERATIVE DIAGNOSIS:  Multiple Brain Metastases  PROCEDURE:  Stereotactic Radiosurgery  SURGEON:  Elaina Hoops, MD  NARRATIVE: The patient underwent a radiation treatment planning session in the radiation oncology simulation suite under the care of the radiation oncology physician and physicist.  I participated closely in the radiation treatment planning afterwards. The patient underwent planning CT which was fused to 3T high resolution MRI with 1 mm axial slices.  These images were fused on the planning system.  We contoured the gross target volumes and subsequently expanded this to yield the Planning Target Volume. I actively participated in the planning process.  I helped to define and review the target contours and also the contours of the optic pathway, eyes, brainstem and selected nearby organs at risk.  All the dose constraints for critical structures were reviewed and compared to AAPM Task Group 101.  The prescription dose conformity was reviewed.  I approved the plan electronically.    Accordingly, Ethan Brown was brought to the TrueBeam stereotactic radiation treatment linac and placed in the custom immobilization mask.  The patient was aligned according to the IR fiducial markers with BrainLab Exactrac, then orthogonal x-rays were used in ExacTrac with the 6DOF robotic table and the shifts were made to align the patient  Ethan Brown received stereotactic radiosurgery uneventfully.    Lesions treated:  2   Complex lesions treated:  0 (>3.5 cm, <65mm of optic path, or within the brainstem)   The detailed description of the procedure is recorded in the radiation oncology procedure note.  I was present for the duration of the procedure.  DISPOSITION:  Following delivery, the patient was  transported to nursing in stable condition and monitored for possible acute effects to be discharged to home in stable condition with follow-up in one month.  Elaina Hoops, MD 07/22/2021 3:54 PM

## 2021-07-23 ENCOUNTER — Other Ambulatory Visit: Payer: Self-pay | Admitting: *Deleted

## 2021-07-23 ENCOUNTER — Inpatient Hospital Stay: Payer: Medicare Other

## 2021-07-23 ENCOUNTER — Institutional Professional Consult (permissible substitution) (INDEPENDENT_AMBULATORY_CARE_PROVIDER_SITE_OTHER): Payer: Medicare Other | Admitting: Thoracic Surgery (Cardiothoracic Vascular Surgery)

## 2021-07-23 VITALS — BP 124/85 | HR 96 | Resp 20 | Ht 71.0 in | Wt 192.0 lb

## 2021-07-23 DIAGNOSIS — J90 Pleural effusion, not elsewhere classified: Secondary | ICD-10-CM

## 2021-07-23 DIAGNOSIS — C342 Malignant neoplasm of middle lobe, bronchus or lung: Secondary | ICD-10-CM | POA: Diagnosis not present

## 2021-07-23 DIAGNOSIS — C3491 Malignant neoplasm of unspecified part of right bronchus or lung: Secondary | ICD-10-CM

## 2021-07-23 DIAGNOSIS — E46 Unspecified protein-calorie malnutrition: Secondary | ICD-10-CM | POA: Diagnosis not present

## 2021-07-23 DIAGNOSIS — M7989 Other specified soft tissue disorders: Secondary | ICD-10-CM | POA: Diagnosis not present

## 2021-07-23 DIAGNOSIS — C7951 Secondary malignant neoplasm of bone: Secondary | ICD-10-CM | POA: Diagnosis not present

## 2021-07-23 DIAGNOSIS — C7931 Secondary malignant neoplasm of brain: Secondary | ICD-10-CM | POA: Diagnosis not present

## 2021-07-23 DIAGNOSIS — Z95828 Presence of other vascular implants and grafts: Secondary | ICD-10-CM

## 2021-07-23 DIAGNOSIS — J910 Malignant pleural effusion: Secondary | ICD-10-CM | POA: Diagnosis not present

## 2021-07-23 DIAGNOSIS — C7801 Secondary malignant neoplasm of right lung: Secondary | ICD-10-CM | POA: Diagnosis not present

## 2021-07-23 DIAGNOSIS — Z5181 Encounter for therapeutic drug level monitoring: Secondary | ICD-10-CM

## 2021-07-23 LAB — CBC WITH DIFFERENTIAL (CANCER CENTER ONLY)
Abs Immature Granulocytes: 0.01 10*3/uL (ref 0.00–0.07)
Basophils Absolute: 0 10*3/uL (ref 0.0–0.1)
Basophils Relative: 1 %
Eosinophils Absolute: 0.1 10*3/uL (ref 0.0–0.5)
Eosinophils Relative: 2 %
HCT: 31.3 % — ABNORMAL LOW (ref 39.0–52.0)
Hemoglobin: 10.3 g/dL — ABNORMAL LOW (ref 13.0–17.0)
Immature Granulocytes: 0 %
Lymphocytes Relative: 16 %
Lymphs Abs: 0.7 10*3/uL (ref 0.7–4.0)
MCH: 31.5 pg (ref 26.0–34.0)
MCHC: 32.9 g/dL (ref 30.0–36.0)
MCV: 95.7 fL (ref 80.0–100.0)
Monocytes Absolute: 0.3 10*3/uL (ref 0.1–1.0)
Monocytes Relative: 7 %
Neutro Abs: 3.3 10*3/uL (ref 1.7–7.7)
Neutrophils Relative %: 74 %
Platelet Count: 317 10*3/uL (ref 150–400)
RBC: 3.27 MIL/uL — ABNORMAL LOW (ref 4.22–5.81)
RDW: 14.6 % (ref 11.5–15.5)
WBC Count: 4.4 10*3/uL (ref 4.0–10.5)
nRBC: 0 % (ref 0.0–0.2)

## 2021-07-23 LAB — CMP (CANCER CENTER ONLY)
ALT: 17 U/L (ref 0–44)
AST: 19 U/L (ref 15–41)
Albumin: 2.2 g/dL — ABNORMAL LOW (ref 3.5–5.0)
Alkaline Phosphatase: 124 U/L (ref 38–126)
Anion gap: 6 (ref 5–15)
BUN: 18 mg/dL (ref 8–23)
CO2: 28 mmol/L (ref 22–32)
Calcium: 8.3 mg/dL — ABNORMAL LOW (ref 8.9–10.3)
Chloride: 103 mmol/L (ref 98–111)
Creatinine: 0.91 mg/dL (ref 0.61–1.24)
GFR, Estimated: >60 mL/min (ref >60)
Glucose, Bld: 156 mg/dL — ABNORMAL HIGH (ref 70–99)
Potassium: 3.7 mmol/L (ref 3.5–5.1)
Sodium: 137 mmol/L (ref 135–145)
Total Bilirubin: 0.5 mg/dL (ref 0.3–1.2)
Total Protein: 5.6 g/dL — ABNORMAL LOW (ref 6.5–8.1)

## 2021-07-23 MED ORDER — HEPARIN SOD (PORK) LOCK FLUSH 100 UNIT/ML IV SOLN: 500.0000 [IU] | Freq: Once | INTRAVENOUS | Status: DC | PRN

## 2021-07-23 MED ORDER — SODIUM CHLORIDE 0.9% FLUSH: 10.0000 mL | INTRAVENOUS | Status: DC | PRN

## 2021-07-23 NOTE — Progress Notes (Signed)
PCP is Laurey Morale, MD Referring Provider is Curt Bears, MD  Chief Complaint  Patient presents with   Pleural Effusion    Surgical consult, Chest CT 06/23/21, HX of Thoracentesis    HPI: [name] is sent for consultation regarding a left pleural effusion.  Ethan Brown is a 70 year old man with a past medical history significant for stage IV lung cancer with malignant effusions and brain metastases, hypertension, hyperlipidemia, transient global amnesia.  I had pleural biopsy and placed a pleural catheter for recurrent pleural effusions back in August 2020.  Recently he has had recurrent left pleural effusion.  It is symptomatic.  He has had 3 thoracenteses over the past few months.  Most recently they drained a liter and the time before that was 1.5 L.  He did have reexpansion of the lungs afterwards.  He also had symptomatic relief but only lasted a couple of weeks before his shortness of breath returned.  He had SRS for brain metastases yesterday.  He has been treated with chemotherapy and multiple targeted agents.  He currently is getting chemo with docetaxel and just got approved for afatinib.  Past Medical History:  Diagnosis Date   Borderline hypertension    Brain metastasis (Ethan Brown) dx'd 01/2019   Detached vitreous humor, left    Dyspnea    ED (erectile dysfunction) of organic origin    Hyperlipidemia    Hypertension    Lens subluxation, left    Lung cancer (Ethan Brown) dx'd 01/2019   Stage IV   Migraine headache    takes PRN Imitrex   Pseudophakia    TGA (transient global amnesia) 2017    Past Surgical History:  Procedure Laterality Date   CAROTID DOPPLERS  09/2017   Mild non-obstructive disease   CATARACT EXTRACTION, BILATERAL     cataract, left  2018   CHEST TUBE INSERTION Right 03/29/2019   Procedure: INSERTION PLEURAL DRAINAGE CATHETER;  Surgeon: [name], MD;  Location: [facility];  Service: Thoracic;  Laterality: Right;   COLONOSCOPY  2016   clear,  repeat in 10 yrs    Eye surgeries     , Lens attachment.  Vitrectomy   FEMORAL HERNIA REPAIR     HERNIA REPAIR     IR THORACENTESIS ASP PLEURAL SPACE W/IMG GUIDE  02/22/2019   IR THORACENTESIS ASP PLEURAL SPACE W/IMG GUIDE  03/13/2019   IR THORACENTESIS ASP PLEURAL SPACE W/IMG GUIDE  03/18/2020   LUMBAR LAMINECTOMY     PLEURAL BIOPSY Right 03/29/2019   Procedure: PLEURAL BIOPSY;  Surgeon: [name], MD;  Location: [facility];  Service: Thoracic;  Laterality: Right;   PORTACATH PLACEMENT N/A 03/29/2019   Procedure: INSERTION PORT-A-CATH;  Surgeon: [name], MD;  Location: [facility];  Service: Thoracic;  Laterality: N/A;   retinal attachment, right     sclearl buckle, right     TRANSTHORACIC ECHOCARDIOGRAM  12/14/2019   EF 50 EF 55% (low normal).  Abnormal septal wall motion related to BBB; indeterminate diastolic parameters.  Mild RV enlargement with mildly reduced function.  Normal valves.  Normal atrial sizes.   victrectomy,right     VIDEO ASSISTED THORACOSCOPY Right 03/29/2019   Procedure: VIDEO ASSISTED THORACOSCOPY;  Surgeon: [name], MD;  Location: [facility];  Service: Thoracic;  Laterality: Right;    Family History  Problem Relation Age of Onset   Alcohol abuse Mother    Diabetes Father    Hypertension Father    Prostate cancer Father  Cancer Father    Heart attack Maternal Grandfather 6    Social History Social History   Tobacco Use   Smoking status: Never   Smokeless tobacco: Never  Vaping Use   Vaping Use: Never used  Substance Use Topics   Alcohol use: Yes    Alcohol/week: 1.0 standard drink    Types: 1 Glasses of wine per week   Drug use: No    Current Outpatient Medications  Medication Sig Dispense Refill   afatinib dimaleate (GILOTRIF) 30 MG tablet Take 1 tablet (30 mg total) by mouth daily. Take on an empty stomach 1hr before or 2hrs after meals. 30 tablet 3   amivantamab-vmjw 1,050 mg in sodium chloride 0.9 % 250 mL Inject  1,050 mg into the vein once.     apixaban (ELIQUIS) 5 MG TABS tablet Take 1 tablet (5 mg total) by mouth 2 (two) times daily. 60 tablet 3   clindamycin (CLINDAGEL) 1 % gel APPLY TO AFFECTED AREA TWICE A DAY 30 g 0   cycloSPORINE (RESTASIS) 0.05 % ophthalmic emulsion Place 1 drop into both eyes 2 (two) times daily.     dexamethasone (DECADRON) 4 MG tablet 2 tablet p.o. twice daily the day before, day of and day after the chemotherapy every 3 weeks 40 tablet 1   furosemide (LASIX) 20 MG tablet 2 Tab in AM and one tab PM 90 tablet 0   hydrochlorothiazide (MICROZIDE) 12.5 MG capsule TAKE 1 CAPSULE BY MOUTH EVERY DAY 90 capsule 3   HYDROcodone bit-homatropine (HYCODAN) 5-1.5 MG/5ML syrup Take 5 mLs by mouth every 6 (six) hours as needed for cough. 473 mL 0   hydrocortisone 1 % lotion Apply 1 application topically 2 (two) times daily. 113 g 0   lidocaine-prilocaine (EMLA) cream Apply to the Port-A-Cath site 30-60 minutes before treatment 30 g 0   LORazepam (ATIVAN) 1 MG tablet Take 1 tablet (1 mg total) by mouth as needed for anxiety (take one tablet 30 minutes prior to treatment and may repeat once just prior to treatment if needed.). 30 tablet 0   magic mouthwash SOLN Take 5 mLs by mouth 4 (four) times daily as needed for mouth pain. Swish and spit or swallow 240 mL 0   memantine (NAMENDA) 10 MG tablet TAKE 1 TABLET BY MOUTH TWICE A DAY (Patient taking differently: Take 10 mg by mouth 2 (two) times daily.) 180 tablet 2   metolazone (ZAROXOLYN) 2.5 MG tablet Take 1 tablet (2.5 mg total) by mouth daily. 3 tablet 0   mirtazapine (REMERON) 15 MG tablet Take 1 tablet (15 mg total) by mouth at bedtime. 30 tablet 2   mupirocin ointment (BACTROBAN) 2 % 2 (two) times daily as needed.     omeprazole (PRILOSEC) 20 MG capsule Take 20 mg by mouth daily.     potassium chloride (KLOR-CON) 10 MEQ tablet TAKE 1 TABLET BY MOUTH EVERY DAY 30 tablet 0   potassium chloride (KLOR-CON) 10 MEQ tablet Take 10 mEq by mouth  daily.     prochlorperazine (COMPAZINE) 10 MG tablet Take 1 tablet (10 mg total) by mouth every 6 (six) hours as needed for nausea or vomiting. 30 tablet 1   sildenafil (REVATIO) 20 MG tablet TAKE AS DIRECTED 10 tablet 11   tamsulosin (FLOMAX) 0.4 MG CAPS capsule Take 0.4 mg by mouth daily.     No current facility-administered medications for this visit.    No Known Allergies  Review of Systems  Constitutional:  Positive for fatigue.  Respiratory:  Positive for cough and shortness of breath.   Cardiovascular:  Positive for leg swelling.  Musculoskeletal:  Positive for gait problem.  All other systems reviewed and are negative.  BP 124/85   Pulse 96   Resp 20   Ht 5\' 11"  (1.803 m)   Wt 192 lb (87.1 kg)   SpO2 92% Comment: RA  BMI 26.78 kg/m  Physical Exam Vitals reviewed.  Constitutional:      Appearance: He is ill-appearing.  HENT:     Head: Normocephalic and atraumatic.  Eyes:     Extraocular Movements: Extraocular movements intact.  Cardiovascular:     Rate and Rhythm: Normal rate and regular rhythm.     Heart sounds: Normal heart sounds. No murmur heard.   No friction rub. No gallop.  Pulmonary:     Effort: Pulmonary effort is normal. No respiratory distress.     Breath sounds: Normal breath sounds. No wheezing.  Abdominal:     General: There is no distension.     Palpations: Abdomen is soft.  Musculoskeletal:     Cervical back: Neck supple.  Lymphadenopathy:     Cervical: No cervical adenopathy.  Skin:    General: Skin is warm and dry.  Neurological:     General: No focal deficit present.     Mental Status: He is alert and oriented to person, place, and time.     Cranial Nerves: No cranial nerve deficit.     Motor: No weakness.     Diagnostic Tests: I reviewed his CT from October 2022 as well as post thoracentesis chest x-rays.  There was a pleural effusion that improved with good reexpansion of the lung with thoracentesis.  Impression: Ethan Brown  is a 70 year old man with stage IV lung cancer.  Couple of years ago he was having difficulty with recurrent right pleural effusions.  We did a pleural biopsy and placed Pleurx catheter.  That effusion resolved and we were able to remove his Pleurx catheter very quickly.  He now has a recurrent left pleural effusion.  He has had 3 thoracenteses within the past couple of months.  Most recent one was a week ago.  It was about 3 weeks prior to that that he had had his effusion drained.  We discussed placement of the left pleural catheter for palliative management of his recurrent pleural effusions.  He has had a pleural catheter before so understands the general nature of the procedure of the presence of an indwelling catheter.  I informed him of the slight differences in how we would approach this is for not doing pleural biopsies, we will plan to do this with local and intravenous sedation.  We will plan to do it as an outpatient procedure.  I informed him of the indications, risk, benefits, and alternatives.  He understands the risks include bleeding, catheter malposition, catheter occlusion, infection, as well as other unforeseeable complications.  He accepts the risks and agrees to proceed.  We do need to allow the fluid to reaccumulate enough that we can place the catheter easily.  He is on apixaban for DVT in his leg.  That will need to be held prior to the procedure.  Plan: Left pleural catheter on Friday, 08/08/2021 He will hold Eliquis for 2 days prior to the procedure   [name], MD [facility] [phone]

## 2021-07-23 NOTE — H&P (View-Only) (Signed)
PCP is Laurey Morale, MD Referring Provider is Curt Bears, MD  Chief Complaint  Patient presents with   Pleural Effusion    Surgical consult, Chest CT 06/23/21, HX of Thoracentesis    HPI: Mr. Verrette is sent for consultation regarding a left pleural effusion.  Teofilo Lupinacci is a 70 year old man with a past medical history significant for stage IV lung cancer with malignant effusions and brain metastases, hypertension, hyperlipidemia, transient global amnesia.  I had pleural biopsy and placed a pleural catheter for recurrent pleural effusions back in August 2020.  Recently he has had recurrent left pleural effusion.  It is symptomatic.  He has had 3 thoracenteses over the past few months.  Most recently they drained a liter and the time before that was 1.5 L.  He did have reexpansion of the lungs afterwards.  He also had symptomatic relief but only lasted a couple of weeks before his shortness of breath returned.  He had SRS for brain metastases yesterday.  He has been treated with chemotherapy and multiple targeted agents.  He currently is getting chemo with docetaxel and just got approved for afatinib.  Past Medical History:  Diagnosis Date   Borderline hypertension    Brain metastasis ([place]) dx'd 01/2019   Detached vitreous humor, left    Dyspnea    ED (erectile dysfunction) of organic origin    Hyperlipidemia    Hypertension    Lens subluxation, left    Lung cancer ([place]) dx'd 01/2019   Stage IV   Migraine headache    takes PRN Imitrex   Pseudophakia    TGA (transient global amnesia) 2017    Past Surgical History:  Procedure Laterality Date   CAROTID DOPPLERS  09/2017   Mild non-obstructive disease   CATARACT EXTRACTION, BILATERAL     cataract, left  2018   CHEST TUBE INSERTION Right 03/29/2019   Procedure: INSERTION PLEURAL DRAINAGE CATHETER;  Surgeon: [name], MD;  Location: [facility];  Service: Thoracic;  Laterality: Right;   COLONOSCOPY  2016   clear,  repeat in 10 yrs    Eye surgeries     , Lens attachment.  Vitrectomy   FEMORAL HERNIA REPAIR     HERNIA REPAIR     IR THORACENTESIS ASP PLEURAL SPACE W/IMG GUIDE  02/22/2019   IR THORACENTESIS ASP PLEURAL SPACE W/IMG GUIDE  03/13/2019   IR THORACENTESIS ASP PLEURAL SPACE W/IMG GUIDE  03/18/2020   LUMBAR LAMINECTOMY     PLEURAL BIOPSY Right 03/29/2019   Procedure: PLEURAL BIOPSY;  Surgeon: [name], MD;  Location: [facility] [facility];  Service: Thoracic;  Laterality: Right;   PORTACATH PLACEMENT N/A 03/29/2019   Procedure: INSERTION PORT-A-CATH;  Surgeon: [name], MD;  Location: [facility];  Service: Thoracic;  Laterality: N/A;   retinal attachment, right     sclearl buckle, right     TRANSTHORACIC ECHOCARDIOGRAM  12/14/2019   EF 50 EF 55% (low normal).  Abnormal septal wall motion related to BBB; indeterminate diastolic parameters.  Mild RV enlargement with mildly reduced function.  Normal valves.  Normal atrial sizes.   victrectomy,right     VIDEO ASSISTED THORACOSCOPY Right 03/29/2019   Procedure: VIDEO ASSISTED THORACOSCOPY;  Surgeon: [name], MD;  Location: [facility];  Service: Thoracic;  Laterality: Right;    Family History  Problem Relation Age of Onset   Alcohol abuse Mother    Diabetes Father    Hypertension Father    Prostate cancer Father  Cancer Father    Heart attack Maternal Grandfather 65    Social History Social History   Tobacco Use   Smoking status: Never   Smokeless tobacco: Never  Vaping Use   Vaping Use: Never used  Substance Use Topics   Alcohol use: Yes    Alcohol/week: 1.0 standard drink    Types: 1 Glasses of wine per week   Drug use: No    Current Outpatient Medications  Medication Sig Dispense Refill   afatinib dimaleate (GILOTRIF) 30 MG tablet Take 1 tablet (30 mg total) by mouth daily. Take on an empty stomach 1hr before or 2hrs after meals. 30 tablet 3   amivantamab-vmjw 1,050 mg in sodium chloride 0.9 % 250 mL Inject  1,050 mg into the vein once.     apixaban (ELIQUIS) 5 MG TABS tablet Take 1 tablet (5 mg total) by mouth 2 (two) times daily. 60 tablet 3   clindamycin (CLINDAGEL) 1 % gel APPLY TO AFFECTED AREA TWICE A DAY 30 g 0   cycloSPORINE (RESTASIS) 0.05 % ophthalmic emulsion Place 1 drop into both eyes 2 (two) times daily.     dexamethasone (DECADRON) 4 MG tablet 2 tablet p.o. twice daily the day before, day of and day after the chemotherapy every 3 weeks 40 tablet 1   furosemide (LASIX) 20 MG tablet 2 Tab in AM and one tab PM 90 tablet 0   hydrochlorothiazide (MICROZIDE) 12.5 MG capsule TAKE 1 CAPSULE BY MOUTH EVERY DAY 90 capsule 3   HYDROcodone bit-homatropine (HYCODAN) 5-1.5 MG/5ML syrup Take 5 mLs by mouth every 6 (six) hours as needed for cough. 473 mL 0   hydrocortisone 1 % lotion Apply 1 application topically 2 (two) times daily. 113 g 0   lidocaine-prilocaine (EMLA) cream Apply to the Port-A-Cath site 30-60 minutes before treatment 30 g 0   LORazepam (ATIVAN) 1 MG tablet Take 1 tablet (1 mg total) by mouth as needed for anxiety (take one tablet 30 minutes prior to treatment and may repeat once just prior to treatment if needed.). 30 tablet 0   magic mouthwash SOLN Take 5 mLs by mouth 4 (four) times daily as needed for mouth pain. Swish and spit or swallow 240 mL 0   memantine (NAMENDA) 10 MG tablet TAKE 1 TABLET BY MOUTH TWICE A DAY (Patient taking differently: Take 10 mg by mouth 2 (two) times daily.) 180 tablet 2   metolazone (ZAROXOLYN) 2.5 MG tablet Take 1 tablet (2.5 mg total) by mouth daily. 3 tablet 0   mirtazapine (REMERON) 15 MG tablet Take 1 tablet (15 mg total) by mouth at bedtime. 30 tablet 2   mupirocin ointment (BACTROBAN) 2 % 2 (two) times daily as needed.     omeprazole (PRILOSEC) 20 MG capsule Take 20 mg by mouth daily.     potassium chloride (KLOR-CON) 10 MEQ tablet TAKE 1 TABLET BY MOUTH EVERY DAY 30 tablet 0   potassium chloride (KLOR-CON) 10 MEQ tablet Take 10 mEq by mouth  daily.     prochlorperazine (COMPAZINE) 10 MG tablet Take 1 tablet (10 mg total) by mouth every 6 (six) hours as needed for nausea or vomiting. 30 tablet 1   sildenafil (REVATIO) 20 MG tablet TAKE AS DIRECTED 10 tablet 11   tamsulosin (FLOMAX) 0.4 MG CAPS capsule Take 0.4 mg by mouth daily.     No current facility-administered medications for this visit.    No Known Allergies  Review of Systems  Constitutional:  Positive for fatigue.  Respiratory:  Positive for cough and shortness of breath.   Cardiovascular:  Positive for leg swelling.  Musculoskeletal:  Positive for gait problem.  All other systems reviewed and are negative.  BP 124/85    Pulse 96    Resp 20    Ht 5\' 11"  (1.803 m)    Wt 192 lb (87.1 kg)    SpO2 92% Comment: RA   BMI 26.78 kg/m  Physical Exam Vitals reviewed.  Constitutional:      Appearance: He is ill-appearing.  HENT:     Head: Normocephalic and atraumatic.  Eyes:     Extraocular Movements: Extraocular movements intact.  Cardiovascular:     Rate and Rhythm: Normal rate and regular rhythm.     Heart sounds: Normal heart sounds. No murmur heard.   No friction rub. No gallop.  Pulmonary:     Effort: Pulmonary effort is normal. No respiratory distress.     Breath sounds: Normal breath sounds. No wheezing.  Abdominal:     General: There is no distension.     Palpations: Abdomen is soft.  Musculoskeletal:     Cervical back: Neck supple.  Lymphadenopathy:     Cervical: No cervical adenopathy.  Skin:    General: Skin is warm and dry.  Neurological:     General: No focal deficit present.     Mental Status: He is alert and oriented to person, place, and time.     Cranial Nerves: No cranial nerve deficit.     Motor: No weakness.     Diagnostic Tests: I reviewed his CT from October 2022 as well as post thoracentesis chest x-rays.  There was a pleural effusion that improved with good reexpansion of the lung with thoracentesis.  Impression: Boy Delamater  is a 70 year old man with stage IV lung cancer.  Couple of years ago he was having difficulty with recurrent right pleural effusions.  We did a pleural biopsy and placed Pleurx catheter.  That effusion resolved and we were able to remove his Pleurx catheter very quickly.  He now has a recurrent left pleural effusion.  He has had 3 thoracenteses within the past couple of months.  Most recent one was a week ago.  It was about 3 weeks prior to that that he had had his effusion drained.  We discussed placement of the left pleural catheter for palliative management of his recurrent pleural effusions.  He has had a pleural catheter before so understands the general nature of the procedure of the presence of an indwelling catheter.  I informed him of the slight differences in how we would approach this is for not doing pleural biopsies, we will plan to do this with local and intravenous sedation.  We will plan to do it as an outpatient procedure.  I informed him of the indications, risk, benefits, and alternatives.  He understands the risks include bleeding, catheter malposition, catheter occlusion, infection, as well as other unforeseeable complications.  He accepts the risks and agrees to proceed.  We do need to allow the fluid to reaccumulate enough that we can place the catheter easily.  He is on apixaban for DVT in his leg.  That will need to be held prior to the procedure.  Plan: Left pleural catheter on Friday, 08/08/2021 He will hold Eliquis for 2 days prior to the procedure   [name], MD [facility] [phone]

## 2021-07-24 ENCOUNTER — Encounter: Payer: Self-pay | Admitting: *Deleted

## 2021-07-24 DIAGNOSIS — C61 Malignant neoplasm of prostate: Secondary | ICD-10-CM | POA: Diagnosis not present

## 2021-07-24 NOTE — Progress Notes (Signed)
  Radiation Oncology         [phone] ________________________________  Stereotactic Treatment Procedure Note  Name: Branson Kranz MRN: [id number]  Date: 07/22/2021  DOB: Apr 01, 1951  SPECIAL TREATMENT PROCEDURE    ICD-10-CM   1. Primary malignant neoplasm of lung with metastasis to brain ([place])  C34.90    C79.31       3D TREATMENT PLANNING AND DOSIMETRY:  The patient's radiation plan was reviewed and approved by neurosurgery and radiation oncology prior to treatment.  It showed 3-dimensional radiation distributions overlaid onto the planning CT/MRI image set.  The [facility] for the target structures as well as the organs at risk were reviewed. The documentation of the 3D plan and dosimetry are filed in the radiation oncology EMR.  NARRATIVE:  Guillaume Weninger was brought to the TrueBeam stereotactic radiation treatment machine and placed supine on the CT couch. The head frame was applied, and the patient was set up for stereotactic radiosurgery.  Neurosurgery was present for the set-up and delivery  SIMULATION VERIFICATION:  In the couch zero-angle position, the patient underwent Exactrac imaging using the Brainlab system with orthogonal KV images.  These were carefully aligned and repeated to confirm treatment position for each of the isocenters.  The Exactrac snap film verification was repeated at each couch angle.  PROCEDURE: [name] received stereotactic radiosurgery to the following targets: Right temporal 5 mm target was treated using 3 Dynamic Conformal Arcs to a prescription dose of 20 Gy.  ExacTrac registration was performed for each couch angle.  The 100% isodose line was prescribed.  6 MV X-rays were delivered in the flattening filter free beam mode. Left left ventricle 2 mm target was treated using 3 Dynamic Conformal Arcs to a prescription dose of 20 Gy.  ExacTrac registration was performed for each couch angle.  The 100% isodose line was prescribed.  6  MV X-rays were delivered in the flattening filter free beam mode.  STEREOTACTIC TREATMENT MANAGEMENT:  Following delivery, the patient was transported to nursing in stable condition and monitored for possible acute effects.  Vital signs were recorded BP 124/72 (BP Location: Left Arm, Patient Position: Sitting)   Pulse 97   Temp (!) 96.7 F (35.9 C) (Temporal)   Resp 18   SpO2 100% . The patient tolerated treatment without significant acute effects, and was discharged to home in stable condition.    PLAN: Follow-up in one month.  ________________________________  Sheral Apley. Tammi Klippel, M.D.

## 2021-07-25 ENCOUNTER — Other Ambulatory Visit: Payer: Self-pay | Admitting: Internal Medicine

## 2021-07-25 ENCOUNTER — Ambulatory Visit (HOSPITAL_COMMUNITY)
Admission: RE | Admit: 2021-07-25 | Discharge: 2021-07-25 | Disposition: A | Discharge status: Home / Self Care | Payer: Medicare Other | Source: Ambulatory Visit | Attending: Internal Medicine | Admitting: Internal Medicine

## 2021-07-25 DIAGNOSIS — C3491 Malignant neoplasm of unspecified part of right bronchus or lung: Secondary | ICD-10-CM | POA: Insufficient documentation

## 2021-07-25 DIAGNOSIS — S32511A Fracture of superior rim of right pubis, initial encounter for closed fracture: Secondary | ICD-10-CM | POA: Diagnosis not present

## 2021-07-28 ENCOUNTER — Other Ambulatory Visit: Payer: Self-pay | Admitting: Internal Medicine

## 2021-07-28 ENCOUNTER — Telehealth: Payer: Self-pay | Admitting: Medical Oncology

## 2021-07-28 NOTE — Telephone Encounter (Signed)
MRI Right hip results in chart. Confirmed with [facility] radiology.  "IMPRESSION: 1. Numerous bone lesions involving the pelvis, proximal femurs, L3 vertebral body and L5 vertebral bodies consistent with metastatic disease given the patient's history of lung cancer. 2. Large focus of metastatic disease in the intertrochanteric aspect of the right femur involving approximately 7 5% of the intramedullary cavity and extending into the neck concerning for high risk for a pathologic fracture. 3. Minimally displaced pathologic fracture at the right superior pubic ramus-acetabular junction. 4. Mild-moderate osteoarthritis of bilateral hips"

## 2021-07-29 NOTE — Telephone Encounter (Signed)
Oral Oncology Pharmacist Encounter  Received notification from [place] Patient Assistance program that patient is not eligible for enrollment into their program due to patient income exceeding program eligibility limits.  I called and spoke with patient's wife, Keeton Kassebaum. She is aware of the denial for manufacturer assistance. All current grant foundations are closed for enrollment for non-small cell lung cancer, but if these are to open up we will apply on patient's behalf at that time. I explained this to Ms. Aplin and she expressed appreciation and understanding.   Oral Oncology Clinic will continue to follow.    Leron Croak, PharmD, BCPS Hematology/Oncology Clinical Pharmacist [facility] [phone] 07/29/2021 2:12 PM

## 2021-07-30 ENCOUNTER — Telehealth: Payer: Self-pay | Admitting: Urology

## 2021-07-30 ENCOUNTER — Other Ambulatory Visit: Payer: Self-pay

## 2021-07-30 ENCOUNTER — Inpatient Hospital Stay: Payer: Medicare Other | Attending: Internal Medicine

## 2021-07-30 DIAGNOSIS — Z923 Personal history of irradiation: Secondary | ICD-10-CM | POA: Diagnosis not present

## 2021-07-30 DIAGNOSIS — Z79899 Other long term (current) drug therapy: Secondary | ICD-10-CM | POA: Insufficient documentation

## 2021-07-30 DIAGNOSIS — Z7901 Long term (current) use of anticoagulants: Secondary | ICD-10-CM | POA: Diagnosis not present

## 2021-07-30 DIAGNOSIS — C7931 Secondary malignant neoplasm of brain: Secondary | ICD-10-CM | POA: Insufficient documentation

## 2021-07-30 DIAGNOSIS — C3491 Malignant neoplasm of unspecified part of right bronchus or lung: Secondary | ICD-10-CM

## 2021-07-30 DIAGNOSIS — C787 Secondary malignant neoplasm of liver and intrahepatic bile duct: Secondary | ICD-10-CM | POA: Insufficient documentation

## 2021-07-30 DIAGNOSIS — C7951 Secondary malignant neoplasm of bone: Secondary | ICD-10-CM | POA: Insufficient documentation

## 2021-07-30 DIAGNOSIS — Z95828 Presence of other vascular implants and grafts: Secondary | ICD-10-CM

## 2021-07-30 DIAGNOSIS — Z86718 Personal history of other venous thrombosis and embolism: Secondary | ICD-10-CM | POA: Diagnosis not present

## 2021-07-30 DIAGNOSIS — Z7952 Long term (current) use of systemic steroids: Secondary | ICD-10-CM | POA: Diagnosis not present

## 2021-07-30 DIAGNOSIS — C342 Malignant neoplasm of middle lobe, bronchus or lung: Secondary | ICD-10-CM | POA: Diagnosis not present

## 2021-07-30 LAB — CMP (CANCER CENTER ONLY)
ALT: 18 U/L (ref 0–44)
AST: 15 U/L (ref 15–41)
Albumin: 2.1 g/dL — ABNORMAL LOW (ref 3.5–5.0)
Alkaline Phosphatase: 173 U/L — ABNORMAL HIGH (ref 38–126)
Anion gap: 10 (ref 5–15)
BUN: 15 mg/dL (ref 8–23)
CO2: 25 mmol/L (ref 22–32)
Calcium: 8.5 mg/dL — ABNORMAL LOW (ref 8.9–10.3)
Chloride: 105 mmol/L (ref 98–111)
Creatinine: 0.93 mg/dL (ref 0.61–1.24)
GFR, Estimated: >60 mL/min (ref >60)
Glucose, Bld: 114 mg/dL — ABNORMAL HIGH (ref 70–99)
Potassium: 4.2 mmol/L (ref 3.5–5.1)
Sodium: 140 mmol/L (ref 135–145)
Total Bilirubin: 0.3 mg/dL (ref 0.3–1.2)
Total Protein: 5.9 g/dL — ABNORMAL LOW (ref 6.5–8.1)

## 2021-07-30 LAB — CBC WITH DIFFERENTIAL (CANCER CENTER ONLY)
Abs Immature Granulocytes: 0.01 10*3/uL (ref 0.00–0.07)
Basophils Absolute: 0 10*3/uL (ref 0.0–0.1)
Basophils Relative: 1 %
Eosinophils Absolute: 0.1 10*3/uL (ref 0.0–0.5)
Eosinophils Relative: 2 %
HCT: 33.1 % — ABNORMAL LOW (ref 39.0–52.0)
Hemoglobin: 11.1 g/dL — ABNORMAL LOW (ref 13.0–17.0)
Immature Granulocytes: 0 %
Lymphocytes Relative: 20 %
Lymphs Abs: 1 10*3/uL (ref 0.7–4.0)
MCH: 31.5 pg (ref 26.0–34.0)
MCHC: 33.5 g/dL (ref 30.0–36.0)
MCV: 94 fL (ref 80.0–100.0)
Monocytes Absolute: 0.3 10*3/uL (ref 0.1–1.0)
Monocytes Relative: 7 %
Neutro Abs: 3.3 10*3/uL (ref 1.7–7.7)
Neutrophils Relative %: 70 %
Platelet Count: 416 10*3/uL — ABNORMAL HIGH (ref 150–400)
RBC: 3.52 MIL/uL — ABNORMAL LOW (ref 4.22–5.81)
RDW: 14.3 % (ref 11.5–15.5)
WBC Count: 4.7 10*3/uL (ref 4.0–10.5)
nRBC: 0 % (ref 0.0–0.2)

## 2021-07-30 MED ORDER — HEPARIN SOD (PORK) LOCK FLUSH 100 UNIT/ML IV SOLN
500.0000 [IU] | Freq: Once | INTRAVENOUS | Status: AC | PRN
  Administered 2021-07-30: 500 [IU]

## 2021-07-30 MED ORDER — SODIUM CHLORIDE 0.9% FLUSH
10.0000 mL | INTRAVENOUS | Status: DC | PRN
  Administered 2021-07-30: 10 mL

## 2021-07-30 NOTE — Telephone Encounter (Signed)
We were contacted by Dr. Earlie Server and asked to review this patient's recent imaging studies to see if he would be a candidate for palliative radiation to the right hip.  He has been having significant right hip pain that is severely limiting his ability to ambulate and participate in ADLs.  He had a recent MRI of the right hip which was relatively limited due to motion artifact but showed an intertrochanteric intramedullary bone lesion extending into the femoral neck involving greater than 75% of the intramedullary cavity with surrounding marrow edema measuring approximately 3.2 x 3.5 cm with an adjacent 10 mm bone lesion most consistent with metastatic disease.  Additionally, there is a 3.3 cm bone lesion in the right lesser trochanter and an abnormal bone lesion in the right superior anterior acetabulum extending into the right superior pubic ramus with bone destruction and a pathologic fracture at the junction of the acetabulum and superior pubic ramus.  Dr. Tammi Klippel has personally reviewed the imaging and recommends palliative radiation to try and reduce his risk for fracture and help control his pain.  I discussed this with the patient and his wife by telephone this afternoon and they are both in agreement to proceed.  I have tentatively scheduled him for CT simulation at 10:30 AM on 07/31/2021 for a mark and start with plans for his first treatment later that afternoon but advised that our CT simulation team would be reaching out to them to confirm the exact time of his simulation/planning as this could change based on their availability.  We anticipate a 2-week course of daily palliative radiotherapy to the right hip.  I will share this discussion with Dr. Earlie Server and we will move forward with treatment planning accordingly.  Nicholos Johns, MMS, PA-C [facility]: [phone]  Fax: [phone]

## 2021-07-31 ENCOUNTER — Ambulatory Visit
Admission: RE | Admit: 2021-07-31 | Discharge: 2021-07-31 | Disposition: A | Discharge status: Home / Self Care | Payer: Medicare Other | Source: Ambulatory Visit | Attending: Radiation Oncology | Admitting: Radiation Oncology

## 2021-07-31 ENCOUNTER — Encounter: Payer: Self-pay | Admitting: Internal Medicine

## 2021-07-31 DIAGNOSIS — C7931 Secondary malignant neoplasm of brain: Secondary | ICD-10-CM | POA: Insufficient documentation

## 2021-07-31 DIAGNOSIS — C61 Malignant neoplasm of prostate: Secondary | ICD-10-CM | POA: Diagnosis not present

## 2021-07-31 DIAGNOSIS — C7951 Secondary malignant neoplasm of bone: Secondary | ICD-10-CM | POA: Insufficient documentation

## 2021-07-31 DIAGNOSIS — C3490 Malignant neoplasm of unspecified part of unspecified bronchus or lung: Secondary | ICD-10-CM | POA: Insufficient documentation

## 2021-07-31 DIAGNOSIS — C787 Secondary malignant neoplasm of liver and intrahepatic bile duct: Secondary | ICD-10-CM | POA: Diagnosis not present

## 2021-07-31 DIAGNOSIS — Z86718 Personal history of other venous thrombosis and embolism: Secondary | ICD-10-CM | POA: Diagnosis not present

## 2021-07-31 DIAGNOSIS — Z923 Personal history of irradiation: Secondary | ICD-10-CM | POA: Diagnosis not present

## 2021-07-31 DIAGNOSIS — C342 Malignant neoplasm of middle lobe, bronchus or lung: Secondary | ICD-10-CM | POA: Diagnosis not present

## 2021-07-31 NOTE — Progress Notes (Signed)
  Radiation Oncology         [phone] ________________________________  Name: Ethan Brown MRN: [id number]  Date: 07/31/2021  DOB: 1950/11/21  SIMULATION AND TREATMENT PLANNING NOTE    ICD-10-CM   1. Bone metastases (HCC)  C79.51       DIAGNOSIS:  70 yo male with painful right hip metastases from Stage IV (T2b, N2, M1c) adenocarcinoma of the right middle lobe of the lung.  NARRATIVE:  The patient was brought to the [facility].  Identity was confirmed.  All relevant records and images related to the planned course of therapy were reviewed.  The patient freely provided informed written consent to proceed with treatment after reviewing the details related to the planned course of therapy. The consent form was witnessed and verified by the simulation staff.  Then, the patient was set-up in a stable reproducible  supine position for radiation therapy.  CT images were obtained.  Surface markings were placed.  The CT images were loaded into the planning software.  Then the target and avoidance structures were contoured.  Treatment planning then occurred.  The radiation prescription was entered and confirmed.  Then, I designed and supervised the construction of a total of 3 medically necessary complex treatment devices consisting of leg positioner and MLC apertures to cover the treated hip area.  I have requested : 3D Simulation  I have requested a DVH of the following structures: Rectum, Bladder, femoral heads and target.  PLAN:  The patient will receive 30 Gy in 10 fractions.  ________________________________  Sheral Apley Tammi Klippel, M.D.

## 2021-08-01 ENCOUNTER — Telehealth: Payer: Self-pay

## 2021-08-01 ENCOUNTER — Ambulatory Visit
Admission: RE | Admit: 2021-08-01 | Discharge: 2021-08-01 | Disposition: A | Discharge status: Home / Self Care | Payer: Medicare Other | Source: Ambulatory Visit | Attending: Radiation Oncology | Admitting: Radiation Oncology

## 2021-08-01 DIAGNOSIS — C342 Malignant neoplasm of middle lobe, bronchus or lung: Secondary | ICD-10-CM | POA: Diagnosis not present

## 2021-08-01 DIAGNOSIS — C7931 Secondary malignant neoplasm of brain: Secondary | ICD-10-CM | POA: Diagnosis not present

## 2021-08-01 DIAGNOSIS — C7951 Secondary malignant neoplasm of bone: Secondary | ICD-10-CM | POA: Diagnosis not present

## 2021-08-01 DIAGNOSIS — C787 Secondary malignant neoplasm of liver and intrahepatic bile duct: Secondary | ICD-10-CM | POA: Diagnosis not present

## 2021-08-01 DIAGNOSIS — Z923 Personal history of irradiation: Secondary | ICD-10-CM | POA: Diagnosis not present

## 2021-08-01 DIAGNOSIS — Z86718 Personal history of other venous thrombosis and embolism: Secondary | ICD-10-CM | POA: Diagnosis not present

## 2021-08-01 NOTE — Telephone Encounter (Signed)
Attempted return call to Ethan Brown was unable to reach at this time will try again on Monday to update her on concerns.

## 2021-08-04 ENCOUNTER — Ambulatory Visit
Admission: RE | Admit: 2021-08-04 | Discharge: 2021-08-04 | Disposition: A | Discharge status: Home / Self Care | Payer: Medicare Other | Source: Ambulatory Visit | Attending: Radiation Oncology | Admitting: Radiation Oncology

## 2021-08-04 ENCOUNTER — Other Ambulatory Visit: Payer: Self-pay

## 2021-08-04 DIAGNOSIS — C787 Secondary malignant neoplasm of liver and intrahepatic bile duct: Secondary | ICD-10-CM | POA: Diagnosis not present

## 2021-08-04 DIAGNOSIS — C7931 Secondary malignant neoplasm of brain: Secondary | ICD-10-CM | POA: Diagnosis not present

## 2021-08-04 DIAGNOSIS — Z923 Personal history of irradiation: Secondary | ICD-10-CM | POA: Diagnosis not present

## 2021-08-04 DIAGNOSIS — Z86718 Personal history of other venous thrombosis and embolism: Secondary | ICD-10-CM | POA: Diagnosis not present

## 2021-08-04 DIAGNOSIS — C342 Malignant neoplasm of middle lobe, bronchus or lung: Secondary | ICD-10-CM | POA: Diagnosis not present

## 2021-08-04 DIAGNOSIS — C7951 Secondary malignant neoplasm of bone: Secondary | ICD-10-CM | POA: Diagnosis not present

## 2021-08-04 NOTE — Progress Notes (Signed)
Surgical Instructions    Your procedure is scheduled on 08/08/21.  Report to [facility] Main Entrance "A" at 5:30 A.M., then check in with the Admitting office.  Call this number if you have problems the morning of surgery:  [phone]   If you have any questions prior to your surgery date call [phone]: Open Monday-Friday 8am-4pm    Remember:  Do not eat after midnight the night before your surgery  You may drink clear liquids until 5:30am the morning of your surgery.   Clear liquids allowed are: Water, Non-Citrus Juices (without pulp), Carbonated Beverages, Clear Tea, Black Coffee ONLY (NO MILK, CREAM OR POWDERED CREAMER of any kind), and Gatorade    Take these medicines the morning of surgery with A SIP OF WATER  afatinib dimaleate (GILOTRIF)  cycloSPORINE (RESTASIS)  IF NEEDED: HYDROcodone bit-homatropine (HYCODAN)  magic mouthwash  prochlorperazine (COMPAZINE)  As of today, STOP taking any Aspirin (unless otherwise instructed by your surgeon) Aleve, Naproxen, Ibuprofen, Motrin, Advil, Goody's, BC's, all herbal medications, fish oil, and all vitamins.  Hold Eliquis for 2 days prior to the procedure.   After your COVID test   You are not required to quarantine however you are required to wear a well-fitting mask when you are out and around people not in your household.  If your mask becomes wet or soiled, replace with a new one.  Wash your hands often with soap and water for 20 seconds or clean your hands with an alcohol-based hand sanitizer that contains at least 60% alcohol.  Do not share personal items.  Notify your provider: if you are in close contact with someone who has COVID  or if you develop a fever of 100.4 or greater, sneezing, cough, sore throat, shortness of breath or body aches.             Do not wear jewelry or makeup Do not wear lotions, powders, perfumes/colognes, or deodorant. Men may shave face and neck. Do not bring valuables to the  hospital. DO Not wear nail polish, gel polish, artificial nails, or any other type of covering on natural nails including finger and toenails. If patients have artificial nails, gel coating, etc. that need to be removed by a nail salon, please have this removed prior to surgery or surgery may need to be canceled/delayed if the surgeon/ anesthesia feels like the patient is unable to be adequately monitored.              is not responsible for any belongings or valuables.  Do NOT Smoke (Tobacco/Vaping)  24 hours prior to your procedure  If you use a CPAP at night, you may bring your mask for your overnight stay.   Contacts, glasses, hearing aids, dentures or partials may not be worn into surgery, please bring cases for these belongings   For patients admitted to the hospital, discharge time will be determined by your treatment team.   Patients discharged the day of surgery will not be allowed to drive home, and someone needs to stay with them for 24 hours.  NO VISITORS WILL BE ALLOWED IN PRE-OP WHERE PATIENTS ARE PREPPED FOR SURGERY.  ONLY 1 SUPPORT PERSON MAY BE PRESENT IN THE WAITING ROOM WHILE YOU ARE IN SURGERY.  IF YOU ARE TO BE ADMITTED, ONCE YOU ARE IN YOUR ROOM YOU WILL BE ALLOWED TWO (2) VISITORS. 1 (ONE) VISITOR MAY STAY OVERNIGHT BUT MUST ARRIVE TO THE ROOM BY 8pm.  Minor children may have two parents present.  Special consideration for safety and communication needs will be reviewed on a case by case basis.  Special instructions:    Oral Hygiene is also important to reduce your risk of infection.  Remember - BRUSH YOUR TEETH THE MORNING OF SURGERY WITH YOUR REGULAR TOOTHPASTE   [place]- Preparing For Surgery  Before surgery, you can play an important role. Because skin is not sterile, your skin needs to be as free of germs as possible. You can reduce the number of germs on your skin by washing with CHG (chlorahexidine gluconate) Soap before surgery.  CHG is an  antiseptic cleaner which kills germs and bonds with the skin to continue killing germs even after washing.     Please do not use if you have an allergy to CHG or antibacterial soaps. If your skin becomes reddened/irritated stop using the CHG.  Do not shave (including legs and underarms) for at least 48 hours prior to first CHG shower. It is OK to shave your face.  Please follow these instructions carefully.     Shower the NIGHT BEFORE SURGERY and the MORNING OF SURGERY with CHG Soap.   If you chose to wash your hair, wash your hair first as usual with your normal shampoo. After you shampoo, rinse your hair and body thoroughly to remove the shampoo.  Then [employer] and genitals (private parts) with your normal soap and rinse thoroughly to remove soap.  After that Use CHG Soap as you would any other liquid soap. You can apply CHG directly to the skin and wash gently with a scrungie or a clean washcloth.   Apply the CHG Soap to your body ONLY FROM THE NECK DOWN.  Do not use on open wounds or open sores. Avoid contact with your eyes, ears, mouth and genitals (private parts). Wash Face and genitals (private parts)  with your normal soap.   Wash thoroughly, paying special attention to the area where your surgery will be performed.  Thoroughly rinse your body with warm water from the neck down.  DO NOT shower/wash with your normal soap after using and rinsing off the CHG Soap.  Pat yourself dry with a CLEAN TOWEL.  Wear CLEAN PAJAMAS to bed the night before surgery  Place CLEAN SHEETS on your bed the night before your surgery  DO NOT SLEEP WITH PETS.   Day of Surgery: Take a shower with CHG soap. Wear Clean/Comfortable clothing the morning of surgery Do not apply any deodorants/lotions.   Remember to brush your teeth WITH YOUR REGULAR TOOTHPASTE.   Please read over the following fact sheets that you were given.

## 2021-08-05 ENCOUNTER — Other Ambulatory Visit: Payer: Self-pay

## 2021-08-05 ENCOUNTER — Encounter (HOSPITAL_COMMUNITY): Payer: Self-pay

## 2021-08-05 ENCOUNTER — Encounter (HOSPITAL_COMMUNITY)
Admission: RE | Admit: 2021-08-05 | Discharge: 2021-08-05 | Disposition: A | Discharge status: Home / Self Care | Payer: Medicare Other | Source: Ambulatory Visit | Attending: Thoracic Surgery (Cardiothoracic Vascular Surgery) | Admitting: Thoracic Surgery (Cardiothoracic Vascular Surgery)

## 2021-08-05 ENCOUNTER — Ambulatory Visit
Admission: RE | Admit: 2021-08-05 | Discharge: 2021-08-05 | Disposition: A | Discharge status: Home / Self Care | Payer: Medicare Other | Source: Ambulatory Visit | Attending: Radiation Oncology | Admitting: Radiation Oncology

## 2021-08-05 ENCOUNTER — Ambulatory Visit (HOSPITAL_COMMUNITY)
Admission: RE | Admit: 2021-08-05 | Discharge: 2021-08-05 | Disposition: A | Discharge status: Home / Self Care | Payer: Medicare Other | Source: Ambulatory Visit | Attending: Thoracic Surgery (Cardiothoracic Vascular Surgery) | Admitting: Thoracic Surgery (Cardiothoracic Vascular Surgery)

## 2021-08-05 VITALS — BP 122/85 | HR 89 | Temp 97.6°F | Resp 17 | Ht 71.0 in | Wt 170.0 lb

## 2021-08-05 DIAGNOSIS — Z7901 Long term (current) use of anticoagulants: Secondary | ICD-10-CM | POA: Diagnosis not present

## 2021-08-05 DIAGNOSIS — Z01818 Encounter for other preprocedural examination: Secondary | ICD-10-CM | POA: Insufficient documentation

## 2021-08-05 DIAGNOSIS — I899 Noninfective disorder of lymphatic vessels and lymph nodes, unspecified: Secondary | ICD-10-CM | POA: Insufficient documentation

## 2021-08-05 DIAGNOSIS — Z5181 Encounter for therapeutic drug level monitoring: Secondary | ICD-10-CM | POA: Insufficient documentation

## 2021-08-05 DIAGNOSIS — J90 Pleural effusion, not elsewhere classified: Secondary | ICD-10-CM

## 2021-08-05 DIAGNOSIS — Z20822 Contact with and (suspected) exposure to covid-19: Secondary | ICD-10-CM | POA: Insufficient documentation

## 2021-08-05 DIAGNOSIS — C7951 Secondary malignant neoplasm of bone: Secondary | ICD-10-CM | POA: Diagnosis not present

## 2021-08-05 DIAGNOSIS — J9811 Atelectasis: Secondary | ICD-10-CM | POA: Insufficient documentation

## 2021-08-05 DIAGNOSIS — C342 Malignant neoplasm of middle lobe, bronchus or lung: Secondary | ICD-10-CM | POA: Diagnosis not present

## 2021-08-05 HISTORY — DX: Acute embolism and thrombosis of unspecified deep veins of unspecified lower extremity: I82.409

## 2021-08-05 LAB — SURGICAL PCR SCREEN
MRSA, PCR: NEGATIVE
Staphylococcus aureus: NEGATIVE

## 2021-08-05 LAB — COMPREHENSIVE METABOLIC PANEL
ALT: 21 U/L (ref 0–44)
AST: 20 U/L (ref 15–41)
Albumin: 2.6 g/dL — ABNORMAL LOW (ref 3.5–5.0)
Alkaline Phosphatase: 240 U/L — ABNORMAL HIGH (ref 38–126)
Anion gap: 9 (ref 5–15)
BUN: 18 mg/dL (ref 8–23)
CO2: 28 mmol/L (ref 22–32)
Calcium: 9 mg/dL (ref 8.9–10.3)
Chloride: 102 mmol/L (ref 98–111)
Creatinine, Ser: 1.08 mg/dL (ref 0.61–1.24)
GFR, Estimated: >60 mL/min (ref >60)
Glucose, Bld: 101 mg/dL — ABNORMAL HIGH (ref 70–99)
Potassium: 3.9 mmol/L (ref 3.5–5.1)
Sodium: 139 mmol/L (ref 135–145)
Total Bilirubin: 0.6 mg/dL (ref 0.3–1.2)
Total Protein: 6.2 g/dL — ABNORMAL LOW (ref 6.5–8.1)

## 2021-08-05 LAB — CBC
HCT: 42 % (ref 39.0–52.0)
Hemoglobin: 12.8 g/dL — ABNORMAL LOW (ref 13.0–17.0)
MCH: 30 pg (ref 26.0–34.0)
MCHC: 30.5 g/dL (ref 30.0–36.0)
MCV: 98.6 fL (ref 80.0–100.0)
Platelets: 429 10*3/uL — ABNORMAL HIGH (ref 150–400)
RBC: 4.26 MIL/uL (ref 4.22–5.81)
RDW: 14.3 % (ref 11.5–15.5)
WBC: 5.1 10*3/uL (ref 4.0–10.5)
nRBC: 0 % (ref 0.0–0.2)

## 2021-08-05 LAB — PROTIME-INR
INR: 1.2 (ref 0.8–1.2)
Prothrombin Time: 14.8 seconds (ref 11.4–15.2)

## 2021-08-05 LAB — APTT: aPTT: 30 seconds (ref 24–36)

## 2021-08-05 NOTE — Progress Notes (Signed)
PCP - Annie Main fry Cardiologist - Glenetta Hew Vascular and Vein specialist: Dr. Jamelle Haring RAdiation Oncology: Tammi Klippel Oncologist: Earlie Server  PPM/ICD - denies   Chest x-ray - 08/05/20 EKG - 01/02/21 Stress Test - denies ECHO - 12/14/19 Cardiac Cath - denies  Sleep Study - denies   NO diabetes  As of today, STOP taking any Aspirin (unless otherwise instructed by your surgeon) Aleve, Naproxen, Ibuprofen, Motrin, Advil, Goody's, BC's, all herbal medications, fish oil, and all vitamins.   Hold Eliquis for 2 days prior to the procedure.  ERAS Protcol -yes PRE-SURGERY Ensure or G2- none ordered  COVID TEST- 08/05/21   Anesthesia review: yes, significant history for cancer/chemo/radiation  Patient denies shortness of breath, fever, cough and chest pain at PAT appointment   All instructions explained to the patient, with a verbal understanding of the material. Patient agrees to go over the instructions while at home for a better understanding. Patient also instructed to self quarantine after being tested for COVID-19. The opportunity to ask questions was provided.

## 2021-08-06 ENCOUNTER — Inpatient Hospital Stay: Payer: Medicare Other

## 2021-08-06 ENCOUNTER — Ambulatory Visit
Admission: RE | Admit: 2021-08-06 | Discharge: 2021-08-06 | Disposition: A | Discharge status: Home / Self Care | Payer: Medicare Other | Source: Ambulatory Visit | Attending: Radiation Oncology | Admitting: Radiation Oncology

## 2021-08-06 ENCOUNTER — Inpatient Hospital Stay (HOSPITAL_BASED_OUTPATIENT_CLINIC_OR_DEPARTMENT_OTHER): Payer: Medicare Other | Admitting: Internal Medicine

## 2021-08-06 ENCOUNTER — Encounter: Payer: Self-pay | Admitting: Internal Medicine

## 2021-08-06 ENCOUNTER — Encounter (HOSPITAL_COMMUNITY): Payer: Self-pay | Admitting: Physician Assistant

## 2021-08-06 ENCOUNTER — Encounter (HOSPITAL_COMMUNITY): Payer: Self-pay | Admitting: Certified Registered"

## 2021-08-06 VITALS — BP 125/80 | HR 90 | Temp 97.7°F | Resp 18 | Ht 71.0 in | Wt 170.9 lb

## 2021-08-06 DIAGNOSIS — C7931 Secondary malignant neoplasm of brain: Secondary | ICD-10-CM | POA: Diagnosis not present

## 2021-08-06 DIAGNOSIS — Z86718 Personal history of other venous thrombosis and embolism: Secondary | ICD-10-CM | POA: Diagnosis not present

## 2021-08-06 DIAGNOSIS — C3491 Malignant neoplasm of unspecified part of right bronchus or lung: Secondary | ICD-10-CM

## 2021-08-06 DIAGNOSIS — C342 Malignant neoplasm of middle lobe, bronchus or lung: Secondary | ICD-10-CM | POA: Diagnosis not present

## 2021-08-06 DIAGNOSIS — C787 Secondary malignant neoplasm of liver and intrahepatic bile duct: Secondary | ICD-10-CM | POA: Diagnosis not present

## 2021-08-06 DIAGNOSIS — C7951 Secondary malignant neoplasm of bone: Secondary | ICD-10-CM | POA: Diagnosis not present

## 2021-08-06 DIAGNOSIS — Z95828 Presence of other vascular implants and grafts: Secondary | ICD-10-CM

## 2021-08-06 DIAGNOSIS — Z923 Personal history of irradiation: Secondary | ICD-10-CM | POA: Diagnosis not present

## 2021-08-06 LAB — CBC WITH DIFFERENTIAL (CANCER CENTER ONLY)
Abs Immature Granulocytes: 0.03 10*3/uL (ref 0.00–0.07)
Basophils Absolute: 0 10*3/uL (ref 0.0–0.1)
Basophils Relative: 1 %
Eosinophils Absolute: 0.1 10*3/uL (ref 0.0–0.5)
Eosinophils Relative: 1 %
HCT: 35.1 % — ABNORMAL LOW (ref 39.0–52.0)
Hemoglobin: 11.3 g/dL — ABNORMAL LOW (ref 13.0–17.0)
Immature Granulocytes: 1 %
Lymphocytes Relative: 9 %
Lymphs Abs: 0.5 10*3/uL — ABNORMAL LOW (ref 0.7–4.0)
MCH: 30.9 pg (ref 26.0–34.0)
MCHC: 32.2 g/dL (ref 30.0–36.0)
MCV: 95.9 fL (ref 80.0–100.0)
Monocytes Absolute: 0.3 10*3/uL (ref 0.1–1.0)
Monocytes Relative: 6 %
Neutro Abs: 4 10*3/uL (ref 1.7–7.7)
Neutrophils Relative %: 82 %
Platelet Count: 345 10*3/uL (ref 150–400)
RBC: 3.66 MIL/uL — ABNORMAL LOW (ref 4.22–5.81)
RDW: 14.1 % (ref 11.5–15.5)
WBC Count: 4.9 10*3/uL (ref 4.0–10.5)
nRBC: 0 % (ref 0.0–0.2)

## 2021-08-06 LAB — SARS CORONAVIRUS 2 (TAT 6-24 HRS): SARS Coronavirus 2: NEGATIVE

## 2021-08-06 LAB — CMP (CANCER CENTER ONLY)
ALT: 17 U/L (ref 0–44)
AST: 20 U/L (ref 15–41)
Albumin: 2.4 g/dL — ABNORMAL LOW (ref 3.5–5.0)
Alkaline Phosphatase: 227 U/L — ABNORMAL HIGH (ref 38–126)
Anion gap: 9 (ref 5–15)
BUN: 21 mg/dL (ref 8–23)
CO2: 27 mmol/L (ref 22–32)
Calcium: 8.5 mg/dL — ABNORMAL LOW (ref 8.9–10.3)
Chloride: 107 mmol/L (ref 98–111)
Creatinine: 0.93 mg/dL (ref 0.61–1.24)
GFR, Estimated: >60 mL/min (ref >60)
Glucose, Bld: 113 mg/dL — ABNORMAL HIGH (ref 70–99)
Potassium: 4 mmol/L (ref 3.5–5.1)
Sodium: 143 mmol/L (ref 135–145)
Total Bilirubin: 0.3 mg/dL (ref 0.3–1.2)
Total Protein: 5.7 g/dL — ABNORMAL LOW (ref 6.5–8.1)

## 2021-08-06 MED ORDER — HEPARIN SOD (PORK) LOCK FLUSH 100 UNIT/ML IV SOLN
500.0000 [IU] | Freq: Once | INTRAVENOUS | Status: AC | PRN
  Administered 2021-08-06: 09:00:00 500 [IU]

## 2021-08-06 MED ORDER — SODIUM CHLORIDE 0.9% FLUSH
10.0000 mL | INTRAVENOUS | Status: DC | PRN
  Administered 2021-08-06: 09:00:00 10 mL

## 2021-08-06 MED ORDER — TEMAZEPAM 15 MG PO CAPS: 15.0000 mg | ORAL_CAPSULE | Freq: Every evening | ORAL | 0 refills | Status: DC | PRN

## 2021-08-06 NOTE — Progress Notes (Signed)
Ethan Brown [phone]   Fax:([phone]) [phone]  OFFICE PROGRESS NOTE  Laurey Morale, MD [place] [address]  DIAGNOSIS: Stage IV (T2b, N2, M1c) presented with right middle lobe lung mass in addition to mediastinal lymphadenopathy as well as malignant right pleural effusion with pleural-based nodules and suspicious right hepatic lesion and the brain metastasis diagnosed in July 2020.  Molecular Biomarkers: [id number] (Exon 20 insertion) 0.2% Dacomitinib,Neratinib,Osimertinib  [id number] 0.1% None   PRIOR THERAPY:  1) Systemic chemotherapy with carboplatin for AUC of 5, Alimta 500 mg/M2 and Keytruda 200 mg IV every 3 weeks.  First dose April 04, 2019.  Status post 21 cycles  Starting from cycle #5 the patient is on maintenance treatment with Alimta and Keytruda every 3 weeks.  Starting from cycle #19 he will be on single agent Alimta.  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 2) whole brain irradiation for multiple metastatic brain lesions. 3)  Mobocertinib (Axkibity) 219 mg p.o. daily.  First dose was June 29, 2020.  Status post 6 months of treatment.  This was discontinued secondary to disease progression in the brain as well as the chest. 4) treatment was targeted therapy with Tagrisso 80 mg p.o. daily status post 7 weeks of treatment discontinued secondary to disease progression. 5) Amivantamab 350 Mg IV on day 1 followed by 1050 Mg IV on day 2 of cycle #1 started on March 12, 2021 status post 1 cycle.  Starting from cycle #2 his dose will be 1400 Mg IV weekly for 3 cycles followed by 1400 Mg IV every 2 weeks starting from cycle #5.  Status post 6 cycles.  This is discontinued secondary to disease progression. 6) Systemic chemotherapy with docetaxel 75 Mg/M2 every 3 weeks with Neulasta support.  Status post 2 cycle started on 05/14/2021.  He did not receive Cyramza because of the recent  deep venous thrombosis and treatment with Eliquis.  This treatment was discontinued secondary to disease progression. 7) palliative radiotherapy to the left hip area under the care of Dr. Tammi Klippel  CURRENT THERAPY: GILOTRIF 30 mg p.o. daily.  First dose started July 24, 2021   INTERVAL HISTORY: Ethan Brown 70 y.o. male returns to the clinic today for follow-up visit accompanied by his wife.  The patient is feeling fine today with no concerning complaints except for mild fatigue and pain in the left hip area.  He is currently undergoing palliative radiotherapy to the left hip area under the care of Dr. Tammi Klippel and tolerating his treatment well.  He is scheduled for Pleurx placement in few days.  He denied having any current chest pain but has shortness of breath with exertion with mild cough and no hemoptysis.  His cough and shortness of breath had improved after starting the treatment with Gilotrif.  He denied having any nausea, vomiting but has few episodes of diarrhea with no constipation.  He has no headache or visual changes.  He is here today for evaluation and repeat blood work.  Our oral pharmacist is still working with the assistant foundation on helping him for the coverage of the Giotrif.  She will try to apply for assistant grant in January 2023.   MEDICAL HISTORY: Past Medical History:  Diagnosis Date   Borderline hypertension    Brain metastasis ([facility]) dx'd 01/2019   Detached vitreous humor, left    DVT (deep venous thrombosis) ([facility])    left knee area  Dyspnea    ED (erectile dysfunction) of organic origin    Hyperlipidemia    Hypertension    Lens subluxation, left    Lung cancer ([facility]) dx'd 01/2019   Stage IV   Migraine headache    takes PRN Imitrex   Pseudophakia    TGA (transient global amnesia) 2017    ALLERGIES:  has No Known Allergies.  MEDICATIONS:  Current Outpatient Medications  Medication Sig Dispense Refill   afatinib dimaleate (GILOTRIF) 30 MG  tablet Take 1 tablet (30 mg total) by mouth daily. Take on an empty stomach 1hr before or 2hrs after meals. (Patient taking differently: Take 30 mg by mouth daily before breakfast.) 30 tablet 3   apixaban (ELIQUIS) 5 MG TABS tablet Take 1 tablet (5 mg total) by mouth 2 (two) times daily. 60 tablet 3   clindamycin (CLINDAGEL) 1 % gel APPLY TO AFFECTED AREA TWICE A DAY (Patient not taking: Reported on 08/01/2021) 30 g 0   cycloSPORINE (RESTASIS) 0.05 % ophthalmic emulsion Place 1 drop into both eyes in the morning.     dexamethasone (DECADRON) 4 MG tablet 2 tablet p.o. twice daily the day before, day of and day after the chemotherapy every 3 weeks (Patient not taking: Reported on 08/01/2021) 40 tablet 1   furosemide (LASIX) 20 MG tablet 2 Tab in AM and one tab PM 90 tablet 0   hydrochlorothiazide (MICROZIDE) 12.5 MG capsule TAKE 1 CAPSULE BY MOUTH EVERY DAY (Patient not taking: Reported on 08/01/2021) 90 capsule 3   HYDROcodone bit-homatropine (HYCODAN) 5-1.5 MG/5ML syrup Take 5 mLs by mouth every 6 (six) hours as needed for cough. 473 mL 0   hydrocortisone 1 % lotion Apply 1 application topically 2 (two) times daily. (Patient not taking: Reported on 08/01/2021) 113 g 0   lidocaine-prilocaine (EMLA) cream Apply to the Port-A-Cath site 30-60 minutes before treatment 30 g 0   LORazepam (ATIVAN) 1 MG tablet Take 1 tablet (1 mg total) by mouth as needed for anxiety (take one tablet 30 minutes prior to treatment and may repeat once just prior to treatment if needed.). (Patient taking differently: Take 1 mg by mouth at bedtime as needed for sleep.) 30 tablet 0   magic mouthwash SOLN Take 5 mLs by mouth 4 (four) times daily as needed for mouth pain. Swish and spit or swallow 240 mL 0   memantine (NAMENDA) 10 MG tablet TAKE 1 TABLET BY MOUTH TWICE A DAY (Patient not taking: Reported on 08/01/2021) 180 tablet 2   metolazone (ZAROXOLYN) 2.5 MG tablet Take 1 tablet (2.5 mg total) by mouth daily. (Patient not taking:  Reported on 08/01/2021) 3 tablet 0   mirtazapine (REMERON) 15 MG tablet Take 1 tablet (15 mg total) by mouth at bedtime. (Patient not taking: Reported on 08/01/2021) 30 tablet 2   potassium chloride (KLOR-CON) 10 MEQ tablet TAKE 1 TABLET BY MOUTH EVERY DAY 30 tablet 0   prochlorperazine (COMPAZINE) 10 MG tablet Take 1 tablet (10 mg total) by mouth every 6 (six) hours as needed for nausea or vomiting. 30 tablet 1   sildenafil (REVATIO) 20 MG tablet TAKE AS DIRECTED 10 tablet 11   tamsulosin (FLOMAX) 0.4 MG CAPS capsule Take 0.4 mg by mouth at bedtime.     No current facility-administered medications for this visit.   Facility-Administered Medications Ordered in Other Visits  Medication Dose Route Frequency Provider Last Rate Last Admin   sodium chloride flush (NS) 0.9 % injection 10 mL  10 mL Intracatheter PRN  Curt Bears, MD   10 mL at 08/06/21 0454    SURGICAL HISTORY:  Past Surgical History:  Procedure Laterality Date   CAROTID DOPPLERS  09/2017   Mild non-obstructive disease   CATARACT EXTRACTION, BILATERAL     cataract, left  2018   CHEST TUBE INSERTION Right 03/29/2019   Procedure: INSERTION PLEURAL DRAINAGE CATHETER;  Surgeon: [name], MD;  Location: [facility] OR;  Service: Thoracic;  Laterality: Right;   COLONOSCOPY  2016   clear, repeat in 10 yrs    Eye surgeries     , Lens attachment.  Vitrectomy   FEMORAL HERNIA REPAIR     HERNIA REPAIR     IR THORACENTESIS ASP PLEURAL SPACE W/IMG GUIDE  02/22/2019   IR THORACENTESIS ASP PLEURAL SPACE W/IMG GUIDE  03/13/2019   IR THORACENTESIS ASP PLEURAL SPACE W/IMG GUIDE  03/18/2020   LUMBAR LAMINECTOMY     PLEURAL BIOPSY Right 03/29/2019   Procedure: PLEURAL BIOPSY;  Surgeon: [name], MD;  Location: [facility];  Service: Thoracic;  Laterality: Right;   PORTACATH PLACEMENT N/A 03/29/2019   Procedure: INSERTION PORT-A-CATH;  Surgeon: [name], MD;  Location: [facility];  Service: Thoracic;  Laterality: N/A;    retinal attachment, right     sclearl buckle, right     TRANSTHORACIC ECHOCARDIOGRAM  12/14/2019   EF 50 EF 55% (low normal).  Abnormal septal wall motion related to BBB; indeterminate diastolic parameters.  Mild RV enlargement with mildly reduced function.  Normal valves.  Normal atrial sizes.   victrectomy,right     VIDEO ASSISTED THORACOSCOPY Right 03/29/2019   Procedure: VIDEO ASSISTED THORACOSCOPY;  Surgeon: [name], MD;  Location: [facility];  Service: Thoracic;  Laterality: Right;    REVIEW OF SYSTEMS:  Constitutional: positive for fevers Eyes: negative Ears, nose, mouth, throat, and face: negative Respiratory: positive for cough and dyspnea on exertion Cardiovascular: negative Gastrointestinal: positive for diarrhea Genitourinary:negative Integument/breast: negative Hematologic/lymphatic: negative Musculoskeletal:negative Neurological: negative Behavioral/Psych: positive for sleep disturbance Endocrine: negative Allergic/Immunologic: negative   PHYSICAL EXAMINATION: General appearance: alert, cooperative, fatigued, and no distress Head: Normocephalic, without obvious abnormality, atraumatic Neck: no adenopathy, no JVD, supple, symmetrical, trachea midline, and thyroid not enlarged, symmetric, no tenderness/mass/nodules Lymph nodes: Cervical, supraclavicular, and axillary nodes normal. Resp: diminished breath sounds LLL and RLL and dullness to percussion LLL and RLL Back: symmetric, no curvature. ROM normal. No CVA tenderness. Cardio: regular rate and rhythm, S1, S2 normal, no murmur, click, rub or gallop GI: soft, non-tender; bowel sounds normal; no masses,  no organomegaly Extremities: edema trace edema Neurologic: Alert and oriented X 3, normal strength and tone. Normal symmetric reflexes. Normal coordination and gait   ECOG PERFORMANCE STATUS: 1 - Symptomatic but completely ambulatory  There were no vitals taken for this visit.  LABORATORY DATA: Lab  Results  Component Value Date   WBC 4.9 08/06/2021   HGB 11.3 (L) 08/06/2021   HCT 35.1 (L) 08/06/2021   MCV 95.9 08/06/2021   PLT 345 08/06/2021      Chemistry      Component Value Date/Time   NA 139 08/05/2021 1322   K 3.9 08/05/2021 1322   CL 102 08/05/2021 1322   CO2 28 08/05/2021 1322   BUN 18 08/05/2021 1322   CREATININE 1.08 08/05/2021 1322   CREATININE 0.93 07/30/2021 1114      Component Value Date/Time   CALCIUM 9.0 08/05/2021 1322   ALKPHOS 240 (H) 08/05/2021 1322   AST 20 08/05/2021 1322  AST 15 07/30/2021 1114   ALT 21 08/05/2021 1322   ALT 18 07/30/2021 1114   BILITOT 0.6 08/05/2021 1322   BILITOT 0.3 07/30/2021 1114       RADIOGRAPHIC STUDIES: MR HIP RIGHT WO CONTRAST  Result Date: 07/28/2021 CLINICAL DATA:  History of lung cancer.  Right hip pain. EXAM: MR OF THE RIGHT HIP WITHOUT CONTRAST TECHNIQUE: Multiplanar, multisequence MR imaging was performed. No intravenous contrast was administered. COMPARISON:  None. FINDINGS: Significant patient motion degrades image quality limiting evaluation. Bones: No acute hip fracture or dislocation.  No avascular necrosis. Right intertrochanteric intramedullary bone lesion extending into the femoral neck involving greater than 75% of the intramedullary cavity with surrounding marrow edema measuring approximately 3.2 x 3.2 x 3.5 cm with an adjacent 10 mm bone lesion most consistent with metastatic disease. 3.3 cm bone lesion in the right lesser trochanter. Abnormal bone lesion in the right superior anterior acetabulum extending into the right superior pubic ramus with bone destruction with a pathologic fracture at the junction of the acetabulum and superior pubic ramus. 2.5 cm bone lesion in the left posterior acetabulum. 12 mm bone lesion in the left proximal femoral diaphysis. 8 mm bone lesion in the left superior acetabulum. Multiple subcentimeter bone lesions in the ilium bilaterally. Largest bone lesion in the pelvis is  located in the right posterior ilium. 15 mm bone lesion in the right L5 vertebral body consistent with metastatic disease. 8 mm ill-defined L4 vertebral body bone lesion. 10 mm bone lesion in the S2 vertebral body. Normal sacrum and sacroiliac joints. No SI joint widening or erosive changes. Degenerative disease with disc height loss of the visualized lumbar spine. Articular cartilage and labrum Articular cartilage: Partial-thickness cartilage loss of bilateral hips. Labrum:  Right superior labral degeneration. Joint or bursal effusion Joint effusion:  No hip joint effusion.  No SI joint effusion. Bursae:  No bursa formation. Muscles and tendons Flexors: Normal. Extensors: Normal. Abductors: Normal. Adductors: Mild edema in the right adductor musculature likely reactive. Gluteals: Normal. Hamstrings: Normal. Other findings No pelvic free fluid. No fluid collection or hematoma. No inguinal lymphadenopathy. No inguinal hernia. Small left plantar diverticulum measuring 13 mm. IMPRESSION: 1. Numerous bone lesions involving the pelvis, proximal femurs, L3 vertebral body and L5 vertebral bodies consistent with metastatic disease given the patient's history of lung cancer. 2. Large focus of metastatic disease in the intertrochanteric aspect of the right femur involving approximately 7 5% of the intramedullary cavity and extending into the neck concerning for high risk for a pathologic fracture. 3. Minimally displaced pathologic fracture at the right superior pubic ramus-acetabular junction. 4. Mild-moderate osteoarthritis of bilateral hips. Electronically Signed   By: Kathreen Devoid M.D.   On: 07/28/2021 07:10   DG CHEST PORT 1 VIEW  Result Date: 07/16/2021 CLINICAL DATA:  Post thoracentesis, history of stage IV lung cancer EXAM: PORTABLE CHEST 1 VIEW COMPARISON:  06/25/2021 FINDINGS: RIGHT subclavian Port-A-Cath with tip projecting over SVC. Normal heart size mediastinal contours. RIGHT pleural effusion and scattered  RIGHT lung infiltrates again identified with volume loss in RIGHT chest. Minimal atelectasis LEFT lung with tiny pleural effusion. No new infiltrate or pneumothorax. Osseous structures unremarkable. IMPRESSION: No LEFT pneumothorax following thoracentesis. Chronic infiltrate and effusion in the RIGHT chest. Electronically Signed   By: Lavonia Dana M.D.   On: 07/16/2021 14:36   US Thoracentesis Asp Pleural space w/IMG guide  Result Date: 07/16/2021 INDICATION: Recurrent pleural effusion, undergoing cancer treatment EXAM: ULTRASOUND GUIDED LEFT THORACENTESIS MEDICATIONS: None.  COMPLICATIONS: None immediate. PROCEDURE: An ultrasound guided thoracentesis was thoroughly discussed with the patient and questions answered. The benefits, risks, alternatives and complications were also discussed. The patient understands and wishes to proceed with the procedure. Written consent was obtained. Ultrasound was performed to localize and mark an adequate pocket of fluid in the left chest. The area was then prepped and draped in the normal sterile fashion. 1% Lidocaine was used for local anesthesia. Under ultrasound guidance a 6 Fr Safe-T-Centesis catheter was introduced. Thoracentesis was performed. The catheter was removed and a dressing applied. FINDINGS: A total of approximately 1L of yellow fluid was removed. IMPRESSION: Successful ultrasound guided left thoracentesis yielding 1L of pleural fluid. Electronically Signed   By: Miachel Roux M.D.   On: 07/16/2021 14:30     ASSESSMENT AND PLAN: This is a very pleasant 70 years old white male recently diagnosed with a stage IV (T2b, N2, M1C) non-small cell lung cancer, adenocarcinoma with positive EGFR mutation in exon 20 (resistant mutation) diagnosed in July 2020 and presented with extensive disease involving the right upper lobe as well as the right middle and lower lobe with extensive pleural based metastasis as well as mediastinal and hilar disease with malignant pleural  effusion as well as liver and brain metastasis. Unfortunately the patient has no actionable mutations based on the molecular studies by [employer].   The patient is currently undergoing systemic chemotherapy with carboplatin for AUC of 5, Alimta 500 mg/M2 and Keytruda 200 mg IV every 3 weeks status post 21 cycles.  Starting from cycle #5 the patient is on maintenance treatment with Alimta and Keytruda. Beryle Flock was discontinued after cycle #18 secondary to recurrent immunotherapy mediated pneumonitis. Starting from cycle #19 the patient will be on treatment with single agent Alimta every 3 weeks. The patient has been tolerating the treatment well. He had repeat PET scan and MRI of the brain performed recently.  The PET scan showed evidence for disease progression with metastatic disease involving the thoracic and abdominal lymph nodes as well as the right hemithorax and bones.  He also has loculated moderate right pleural effusion and small left pleural effusion.  The PET scan also showed prostate enlargement and his PSA has been rising recently.  He was seen by urology. MRI of the brain showed development of new 5 mm right temporal lesion suspicious for metastatic disease as well as new 2 mm focus of enhancement in the left periventricular white matter also suspicious for metastatic disease.  He was seen by radiation oncology and expected to have [place] to this lesion soon. The patient started treatment with Mobocertinib (Axkibity) 496 mg p.o. daily.  Status post 6 months of treatment.  He has been tolerating the treatment well with no concerning adverse effects except for the dryness of his nose that is not responding to the over-the-counter medication.  His treatment was discontinued secondary to disease progression. Unfortunately he had significant disease progression in the brain with innumerable brain metastasis in addition to previous increase in some of the pulmonary nodules seen on the previous  imaging studies.  He completed a course of whole brain irradiation. The patient was treated with Tagrisso 80 mg p.o. daily status post 7 weeks of treatment discontinued secondary to disease progression. The patient started last week the first dose of his treatment with Amivantamab with reduced dose of 350 Mg IV on day 1 and 1050 Mg IV on day 2.  He has hypersensitivity reaction on day 1 required a lot  of premedication and treatment with Solu-Medrol, Pepcid and Benadryl as well as Demerol.  He started tolerating his treatment fairly well with no concerning adverse effects starting from cycle #2. He is status post 6 cycles.   The patient tolerated the last cycle of his treatment fairly well. He had repeat CT scan of the chest, abdomen pelvis performed recently.  I personally and independently reviewed the scan images and discussed results with the patient today. Unfortunately his scan showed evidence for disease progression with increase in bilateral pleural effusion as well as the lymphangitic spread of the tumor in the lung.  He also has worsening bone metastasis. He is currently undergoing systemic chemotherapy with docetaxel 75 Mg/M2 with Neulasta support every 3 weeks.  Status post 2 cycles.  This treatment was discontinued secondary to disease progression. The patient was evaluated for clinical trial at [facility] with Poziotinib but unfortunately was not a candidate for the trial.  He started treatment with Gilotrif 30 mg p.o. daily on July 24, 2021.  We are still waiting for approval of assistant grant for him to receive the treatment on continuous basis in 2023.  He is tolerating the treatment well and symptomatically he is feeling much better. I recommended for the patient to continue his treatment with Gilotrif with the same dose. I will see him back for follow-up visit in 3 weeks for evaluation and repeat blood work. For the recurrent bilateral pleural effusion, he is scheduled for  Pleurx catheter placement by Dr. Roxan Hockey later this week. For the right hip pain, he is currently undergoing palliative radiotherapy to the metastatic lesion and the right hip area. For the lower extremity edema, the patient will continue on treatment with diuretic with Lasix as well as Zaroxolyn. For the insomnia, I will start the patient on Restoril initially at 15 mg p.o. nightly but may consider increasing it to 30 mg if needed. He was advised to call immediately if he has any other concerning symptoms in the interval. The patient voices understanding of current disease status and treatment options and is in agreement with the current care plan. All questions were answered. The patient knows to call the clinic with any problems, questions or concerns. We can certainly see the patient much sooner if necessary.  Disclaimer: This note was dictated with voice recognition software. Similar sounding words can inadvertently be transcribed and may not be corrected upon review.

## 2021-08-07 ENCOUNTER — Other Ambulatory Visit: Payer: Self-pay

## 2021-08-07 ENCOUNTER — Ambulatory Visit
Admission: RE | Admit: 2021-08-07 | Discharge: 2021-08-07 | Disposition: A | Discharge status: Home / Self Care | Payer: Medicare Other | Source: Ambulatory Visit | Attending: Radiation Oncology | Admitting: Radiation Oncology

## 2021-08-07 DIAGNOSIS — C7951 Secondary malignant neoplasm of bone: Secondary | ICD-10-CM | POA: Diagnosis not present

## 2021-08-07 DIAGNOSIS — Z923 Personal history of irradiation: Secondary | ICD-10-CM | POA: Diagnosis not present

## 2021-08-07 DIAGNOSIS — C787 Secondary malignant neoplasm of liver and intrahepatic bile duct: Secondary | ICD-10-CM | POA: Diagnosis not present

## 2021-08-07 DIAGNOSIS — C342 Malignant neoplasm of middle lobe, bronchus or lung: Secondary | ICD-10-CM | POA: Diagnosis not present

## 2021-08-07 DIAGNOSIS — C7931 Secondary malignant neoplasm of brain: Secondary | ICD-10-CM | POA: Diagnosis not present

## 2021-08-07 DIAGNOSIS — Z86718 Personal history of other venous thrombosis and embolism: Secondary | ICD-10-CM | POA: Diagnosis not present

## 2021-08-08 ENCOUNTER — Encounter (HOSPITAL_COMMUNITY)
Admission: RE | Disposition: A | Discharge status: Home / Self Care | Payer: Self-pay | Source: Home / Self Care | Attending: Thoracic Surgery (Cardiothoracic Vascular Surgery)

## 2021-08-08 ENCOUNTER — Ambulatory Visit (HOSPITAL_COMMUNITY)
Admission: RE | Admit: 2021-08-08 | Discharge: 2021-08-08 | Disposition: A | Discharge status: Home / Self Care | Payer: Medicare Other | Attending: Thoracic Surgery (Cardiothoracic Vascular Surgery) | Admitting: Thoracic Surgery (Cardiothoracic Vascular Surgery)

## 2021-08-08 ENCOUNTER — Encounter (HOSPITAL_COMMUNITY): Payer: Self-pay | Admitting: Thoracic Surgery (Cardiothoracic Vascular Surgery)

## 2021-08-08 ENCOUNTER — Other Ambulatory Visit: Payer: Self-pay | Admitting: *Deleted

## 2021-08-08 ENCOUNTER — Ambulatory Visit
Admission: RE | Admit: 2021-08-08 | Discharge: 2021-08-08 | Disposition: A | Discharge status: Home / Self Care | Payer: Medicare Other | Source: Ambulatory Visit | Attending: Radiation Oncology | Admitting: Radiation Oncology

## 2021-08-08 DIAGNOSIS — Z923 Personal history of irradiation: Secondary | ICD-10-CM | POA: Diagnosis not present

## 2021-08-08 DIAGNOSIS — J90 Pleural effusion, not elsewhere classified: Secondary | ICD-10-CM | POA: Insufficient documentation

## 2021-08-08 DIAGNOSIS — C7931 Secondary malignant neoplasm of brain: Secondary | ICD-10-CM | POA: Diagnosis not present

## 2021-08-08 DIAGNOSIS — C342 Malignant neoplasm of middle lobe, bronchus or lung: Secondary | ICD-10-CM | POA: Diagnosis not present

## 2021-08-08 DIAGNOSIS — C7951 Secondary malignant neoplasm of bone: Secondary | ICD-10-CM | POA: Diagnosis not present

## 2021-08-08 DIAGNOSIS — Z86718 Personal history of other venous thrombosis and embolism: Secondary | ICD-10-CM | POA: Diagnosis not present

## 2021-08-08 DIAGNOSIS — Z538 Procedure and treatment not carried out for other reasons: Secondary | ICD-10-CM | POA: Diagnosis not present

## 2021-08-08 DIAGNOSIS — C787 Secondary malignant neoplasm of liver and intrahepatic bile duct: Secondary | ICD-10-CM | POA: Diagnosis not present

## 2021-08-08 SURGERY — INSERTION, PLEURAL DRAINAGE CATHETER
Anesthesia: Monitor Anesthesia Care | Laterality: Left

## 2021-08-08 MED ORDER — CEFAZOLIN SODIUM-DEXTROSE 2-4 GM/100ML-% IV SOLN
2.0000 g | INTRAVENOUS | Status: DC
  Filled 2021-08-08: qty 100

## 2021-08-08 MED ORDER — 0.9 % SODIUM CHLORIDE (POUR BTL) OPTIME: TOPICAL | Status: DC | PRN

## 2021-08-08 MED ORDER — MIDAZOLAM HCL 2 MG/2ML IJ SOLN
INTRAMUSCULAR | Status: AC
  Filled 2021-08-08: qty 2

## 2021-08-08 MED ORDER — LIDOCAINE HCL (PF) 1 % IJ SOLN
INTRAMUSCULAR | Status: AC
  Filled 2021-08-08: qty 30

## 2021-08-08 MED ORDER — FENTANYL CITRATE (PF) 250 MCG/5ML IJ SOLN
INTRAMUSCULAR | Status: AC
  Filled 2021-08-08: qty 5

## 2021-08-08 MED ORDER — ORAL CARE MOUTH RINSE: 15.0000 mL | Freq: Once | OROMUCOSAL | Status: AC

## 2021-08-08 MED ORDER — LIDOCAINE HCL 1 % IJ SOLN: INTRAMUSCULAR | Status: DC | PRN

## 2021-08-08 MED ORDER — MIDAZOLAM HCL (PF) 10 MG/2ML IJ SOLN
INTRAMUSCULAR | Status: AC
  Filled 2021-08-08: qty 2

## 2021-08-08 MED ORDER — PROPOFOL 10 MG/ML IV BOLUS
INTRAVENOUS | Status: AC
  Filled 2021-08-08: qty 20

## 2021-08-08 MED ORDER — LACTATED RINGERS IV SOLN: INTRAVENOUS | Status: DC

## 2021-08-08 MED ORDER — CHLORHEXIDINE GLUCONATE 0.12 % MT SOLN
15.0000 mL | Freq: Once | OROMUCOSAL | Status: AC
  Administered 2021-08-08: 15 mL via OROMUCOSAL
  Filled 2021-08-08: qty 15

## 2021-08-08 NOTE — Progress Notes (Signed)
Per Dr. Roxan Hockey, surgery is no longer indicated. Pt has been cancelled. IV removed by CRNA. Pt to be discharged to home with wife. All concerns addressed at this time.   Jacqlyn Larsen, RN

## 2021-08-08 NOTE — Anesthesia Preprocedure Evaluation (Signed)
Anesthesia Evaluation  Patient identified by MRN, date of birth, ID band Patient awake    Reviewed: Allergy & Precautions, H&P , NPO status , Patient's Chart, lab work & pertinent test results  Airway Mallampati: II   Neck ROM: full    Dental   Pulmonary shortness of breath,  Lung CA  Pleural effusion   breath sounds clear to auscultation       Cardiovascular hypertension,  Rhythm:regular Rate:Normal     Neuro/Psych  Headaches, Brain mets  Neuromuscular disease    GI/Hepatic   Endo/Other    Renal/GU      Musculoskeletal   Abdominal   Peds  Hematology   Anesthesia Other Findings   Reproductive/Obstetrics                             Anesthesia Physical Anesthesia Plan  ASA: 4  Anesthesia Plan: MAC   Post-op Pain Management:    Induction: Intravenous  PONV Risk Score and Plan: 1 and Propofol infusion and Treatment may vary due to age or medical condition  Airway Management Planned: Simple Face Mask  Additional Equipment:   Intra-op Plan:   Post-operative Plan:   Informed Consent: I have reviewed the patients History and Physical, chart, labs and discussed the procedure including the risks, benefits and alternatives for the proposed anesthesia with the patient or authorized representative who has indicated his/her understanding and acceptance.     Dental advisory given  Plan Discussed with: CRNA, Anesthesiologist and Surgeon  Anesthesia Plan Comments:         Anesthesia Quick Evaluation

## 2021-08-08 NOTE — Interval H&P Note (Signed)
History and Physical Interval Note:   [name] has not had return of cough and shortness of breath. CXR shows minimal left pleural fluid- not enough to safely place pleural catheter  Will cancel pleural catheter.   Will have him come back to office in 2 weeks with CXR and if effusion reaccumulating will reschedule  He knows to call if he develops symptoms in the meantime 08/08/2021 7:45 AM  [name]  has presented today for surgery, with the diagnosis of LEFT PLEURAL EFFUSION.  The various methods of treatment have been discussed with the patient and family. After consideration of risks, benefits and other options for treatment, the patient has consented to  Procedure(s): INSERTION PLEURAL DRAINAGE CATHETER (Left) as a surgical intervention.  The patient's history has been reviewed, patient examined, no change in status, stable for surgery.  I have reviewed the patient's chart and labs.  Questions were answered to the patient's satisfaction.     [name]

## 2021-08-11 ENCOUNTER — Ambulatory Visit
Admission: RE | Admit: 2021-08-11 | Discharge: 2021-08-11 | Disposition: A | Discharge status: Home / Self Care | Payer: Medicare Other | Source: Ambulatory Visit | Attending: Radiation Oncology | Admitting: Radiation Oncology

## 2021-08-11 ENCOUNTER — Other Ambulatory Visit: Payer: Self-pay

## 2021-08-11 DIAGNOSIS — C7951 Secondary malignant neoplasm of bone: Secondary | ICD-10-CM | POA: Diagnosis not present

## 2021-08-11 DIAGNOSIS — Z86718 Personal history of other venous thrombosis and embolism: Secondary | ICD-10-CM | POA: Diagnosis not present

## 2021-08-11 DIAGNOSIS — Z923 Personal history of irradiation: Secondary | ICD-10-CM | POA: Diagnosis not present

## 2021-08-11 DIAGNOSIS — C787 Secondary malignant neoplasm of liver and intrahepatic bile duct: Secondary | ICD-10-CM | POA: Diagnosis not present

## 2021-08-11 DIAGNOSIS — C7931 Secondary malignant neoplasm of brain: Secondary | ICD-10-CM | POA: Diagnosis not present

## 2021-08-11 DIAGNOSIS — C342 Malignant neoplasm of middle lobe, bronchus or lung: Secondary | ICD-10-CM | POA: Diagnosis not present

## 2021-08-12 ENCOUNTER — Other Ambulatory Visit: Payer: Self-pay | Admitting: Physician Assistant

## 2021-08-12 ENCOUNTER — Other Ambulatory Visit: Payer: Self-pay | Admitting: Internal Medicine

## 2021-08-12 ENCOUNTER — Ambulatory Visit
Admission: RE | Admit: 2021-08-12 | Discharge: 2021-08-12 | Disposition: A | Discharge status: Home / Self Care | Payer: Medicare Other | Source: Ambulatory Visit | Attending: Radiation Oncology | Admitting: Radiation Oncology

## 2021-08-12 DIAGNOSIS — C3491 Malignant neoplasm of unspecified part of right bronchus or lung: Secondary | ICD-10-CM

## 2021-08-12 DIAGNOSIS — C7951 Secondary malignant neoplasm of bone: Secondary | ICD-10-CM | POA: Diagnosis not present

## 2021-08-12 DIAGNOSIS — R609 Edema, unspecified: Secondary | ICD-10-CM

## 2021-08-12 DIAGNOSIS — Z86718 Personal history of other venous thrombosis and embolism: Secondary | ICD-10-CM | POA: Diagnosis not present

## 2021-08-12 DIAGNOSIS — C342 Malignant neoplasm of middle lobe, bronchus or lung: Secondary | ICD-10-CM | POA: Diagnosis not present

## 2021-08-12 DIAGNOSIS — C787 Secondary malignant neoplasm of liver and intrahepatic bile duct: Secondary | ICD-10-CM | POA: Diagnosis not present

## 2021-08-12 DIAGNOSIS — C7931 Secondary malignant neoplasm of brain: Secondary | ICD-10-CM | POA: Diagnosis not present

## 2021-08-12 DIAGNOSIS — Z923 Personal history of irradiation: Secondary | ICD-10-CM | POA: Diagnosis not present

## 2021-08-13 ENCOUNTER — Other Ambulatory Visit: Payer: Self-pay

## 2021-08-13 ENCOUNTER — Ambulatory Visit
Admission: RE | Admit: 2021-08-13 | Discharge: 2021-08-13 | Disposition: A | Discharge status: Home / Self Care | Payer: Medicare Other | Source: Ambulatory Visit | Attending: Radiation Oncology | Admitting: Radiation Oncology

## 2021-08-13 ENCOUNTER — Encounter: Payer: Self-pay | Admitting: Urology

## 2021-08-13 ENCOUNTER — Other Ambulatory Visit: Payer: Self-pay | Admitting: Thoracic Surgery (Cardiothoracic Vascular Surgery)

## 2021-08-13 DIAGNOSIS — C3491 Malignant neoplasm of unspecified part of right bronchus or lung: Secondary | ICD-10-CM

## 2021-08-13 DIAGNOSIS — C7951 Secondary malignant neoplasm of bone: Secondary | ICD-10-CM

## 2021-08-13 DIAGNOSIS — C7931 Secondary malignant neoplasm of brain: Secondary | ICD-10-CM | POA: Diagnosis not present

## 2021-08-13 DIAGNOSIS — Z923 Personal history of irradiation: Secondary | ICD-10-CM | POA: Diagnosis not present

## 2021-08-13 DIAGNOSIS — C787 Secondary malignant neoplasm of liver and intrahepatic bile duct: Secondary | ICD-10-CM | POA: Diagnosis not present

## 2021-08-13 DIAGNOSIS — C342 Malignant neoplasm of middle lobe, bronchus or lung: Secondary | ICD-10-CM | POA: Diagnosis not present

## 2021-08-13 DIAGNOSIS — Z86718 Personal history of other venous thrombosis and embolism: Secondary | ICD-10-CM | POA: Diagnosis not present

## 2021-08-14 ENCOUNTER — Telehealth: Payer: Self-pay

## 2021-08-14 NOTE — Telephone Encounter (Signed)
Nutrition  Patient identified on Malnutrition Screening report for weight loss.   Chart reviewed.  Called patient for nutrition assessment.  No answer. Left message with call back number  Arlene Brickel B. Zenia Resides, [place], [place] Registered Dietitian [phone] (mobile)

## 2021-08-18 NOTE — Progress Notes (Deleted)
Cardiology Office Note:    Date:  08/18/2021   ID:  Ethan Brown, DOB November 05, 1950, MRN [id number]  PCP:  Laurey Morale, MD  Cardiologist:  Glenetta Hew, MD  Electrophysiologist:  None   Referring MD: Laurey Morale, MD   Chief Complaint: follow-up of hypertension and lower extremity edema  History of Present Illness:    Ethan Brown is a 70 y.o. male with a history of , hypertension, hyperlipidemia, RBBB, chronic lower extremity edema related to lymphedema and hypoalbuminemia left lower extremity DVT diagnosed in 03/2021 on Eliquis, transient global amnesia, and stage IV lung cancer with brain metastasis who is followed by Dr. Ellyn Hack and presents today for routine follow-up of hypertension and lower extremity edema.  Patient was referred to Dr. Ellyn Hack in 2019 to establish Cardiology care in [place] after moving from [place], [place]. Patient has no history of CHF or CAD. Since moving to [facility], he has been diagnosed with stage IV lung cancer with brain metastasis and has been treated with chemotherapy and radiation. Last Echo in 11/2019 showed LVEF of 50-55% with normal wall motion, and normal global longitudinal strain at -20.6. RV was mildly enlarged with mildly reduced systolic function.  Patient was last seen by Dr. Ellyn Hack in 02/2021 at which time he was down 18 lbs and was feeling very fatigued. He reported not walking as much due to right hip pain making his gait unstable. He also reported chronic shortness of breath which he felt was getting progressively worse. He has chronic lower extremity edema which has been felt to be due to lymphedema and hypoalbuminemia. Dyspnea was felt to be due to his lung cancer not CHF and he was advised that he could take HCTZ as needed for edema given dizziness associated with this.   Patient was recently seen by his Oncologist Dr. Julien Nordmann on 08/06/2021 at which time he reported improvement in his cough and shortness of breath  since starting Gilotrif. He was still having mild fatigue and pain of his hip. He has had problems with recurrent bilateral pleural effusions and there were plans to have a Pleur-X catheter placed.  Hypertension BP well controlled.  - No longer on HCTZ. - Only on Lasix 40mg  in the morning and 20mg  in the evening for lower extremity edema.  Hyperlipidemia History of hyperlipidemia listed in chart but not on any medications.  - ****  Chronic Lower Extremity Edema Felt to be secondary to lymphedema and hypoalbuminemia.  - Stable.  - Continue Lasix 40mg  in the morning and 20mg  in the evening.  Left Lower Extremity DVT Diagnosed in 03/2021. Most recent dopplers in 05/2021 showed persistent DVT involving the left femoral vein, proximal profunda vein, popliteal vein, posterior tibial veins, and peroneal veins but there had been resolution of the DVT seen at the level of the CFV/SFJ. - Continue Eliquis 5mg  twice daily. - Managed by Dr. Julien Nordmann.  Past Medical History:  Diagnosis Date   Borderline hypertension    Brain metastasis ([place]) dx'd 01/2019   Detached vitreous humor, left    DVT (deep venous thrombosis) (HCC)    left knee area   Dyspnea    ED (erectile dysfunction) of organic origin    Hyperlipidemia    Hypertension    Lens subluxation, left    Lung cancer ([place]) dx'd 01/2019   Stage IV   Migraine headache    takes PRN Imitrex   Pseudophakia    TGA (transient global amnesia) 2017    Past Surgical  History:  Procedure Laterality Date   CAROTID DOPPLERS  09/2017   Mild non-obstructive disease   CATARACT EXTRACTION, BILATERAL     cataract, left  2018   CHEST TUBE INSERTION Right 03/29/2019   Procedure: INSERTION PLEURAL DRAINAGE CATHETER;  Surgeon: [name], MD;  Location: [facility] OR;  Service: Thoracic;  Laterality: Right;   COLONOSCOPY  2016   clear, repeat in 10 yrs    Eye surgeries     , Lens attachment.  Vitrectomy   FEMORAL HERNIA REPAIR     HERNIA REPAIR      IR THORACENTESIS ASP PLEURAL SPACE W/IMG GUIDE  02/22/2019   IR THORACENTESIS ASP PLEURAL SPACE W/IMG GUIDE  03/13/2019   IR THORACENTESIS ASP PLEURAL SPACE W/IMG GUIDE  03/18/2020   LUMBAR LAMINECTOMY     PLEURAL BIOPSY Right 03/29/2019   Procedure: PLEURAL BIOPSY;  Surgeon: [name], MD;  Location: [facility];  Service: Thoracic;  Laterality: Right;   PORTACATH PLACEMENT N/A 03/29/2019   Procedure: INSERTION PORT-A-CATH;  Surgeon: [name], MD;  Location: [facility];  Service: Thoracic;  Laterality: N/A;   retinal attachment, right     sclearl buckle, right     TRANSTHORACIC ECHOCARDIOGRAM  12/14/2019   EF 50 EF 55% (low normal).  Abnormal septal wall motion related to BBB; indeterminate diastolic parameters.  Mild RV enlargement with mildly reduced function.  Normal valves.  Normal atrial sizes.   victrectomy,right     VIDEO ASSISTED THORACOSCOPY Right 03/29/2019   Procedure: VIDEO ASSISTED THORACOSCOPY;  Surgeon: [name], MD;  Location: [facility] OR;  Service: Thoracic;  Laterality: Right;    Current Medications: No outpatient medications have been marked as taking for the 08/26/21 encounter (Appointment) with Darreld Mclean, PA-C.     Allergies:   Patient has no known allergies.   Social History   Socioeconomic History   Marital status: Married    Spouse name: [name]   Number of children: 3   Years of education: Not on file   Highest education level: Master's degree (e.g., MA, MS, MEng, MEd, MSW, MBA)  Occupational History   Occupation: retired Optometrist  Tobacco Use   Smoking status: Never   Smokeless tobacco: Never  Vaping Use   Vaping Use: Never used  Substance and Sexual Activity   Alcohol use: Yes    Alcohol/week: 1.0 standard drink    Types: 1 Glasses of wine per week   Drug use: No   Sexual activity: Not Currently  Other Topics Concern   Not on file  Social History Narrative   Married father of 84, grandfather 4.  Lives with his  wife, [name] they recently moved to [place] after living in [place].      He is a retired Engineer, maintenance (IT)   Was previously on an exercise regimen at [employer] 3 days a week for 90 minutes at a time --> he is now hoping to get back into that plan, but is currently now riding his bike at least 30 to 40 minutes a day for 5 days a week and then doing longer walks about 6 days a week.   Social Determinants of Health   Financial Resource Strain: Not on file  Food Insecurity: Not on file  Transportation Needs: Not on file  Physical Activity: Not on file  Stress: Not on file  Social Connections: Not on file     Family History: The patient's family history includes Alcohol abuse in his mother; Cancer  in his father; Diabetes in his father; Heart attack (age of onset: 38) in his maternal grandfather; Hypertension in his father; Prostate cancer in his father.  ROS:   Please see the history of present illness.     EKGs/Labs/Other Studies Reviewed:    The following studies were reviewed today:  Echocardiogram 12/14/2019: Impressions: 1. Normal GLS -20.6 Abnormal septal motion . Left ventricular ejection  fraction, by estimation, is 50 to 55%. The left ventricle has low normal  function. The left ventricle has no regional wall motion abnormalities.  Left ventricular diastolic parameters   are indeterminate.   2. Right ventricular systolic function is mildly reduced. The right  ventricular size is mildly enlarged.   3. The mitral valve is normal in structure. Trivial mitral valve  regurgitation. No evidence of mitral stenosis.   4. The aortic valve is tricuspid. Aortic valve regurgitation is not  visualized. No aortic stenosis is present.   5. The inferior vena cava is normal in size with greater than 50%  respiratory variability, suggesting right atrial pressure of 3 mmHg.   Comparison(s): 03/01/19 EF 55-60%.  _______________  Lower Extremity Doppler 03/28/2021: Summary:  RIGHT:  -  No evidence of common femoral vein obstruction.     LEFT:  - Findings consistent with acute deep vein thrombosis involving the left  common femoral vein, SF junction, left femoral vein, left proximal  profunda vein, left popliteal vein, left posterior tibial veins, left  peroneal veins, and left gastrocnemius  veins.  - Findings consistent with acute superficial vein thrombosis involving the  left small saphenous vein.  - No cystic structure found in the popliteal fossa. _______________  Lower Extremity Edema 06/05/2021: Summary:  RIGHT:  - No evidence of common femoral vein obstruction.     LEFT:  - Findings consistent with age indeterminate deep vein thrombosis  involving the left femoral vein, left proximal profunda vein, left  popliteal vein, left posterior tibial veins, and left peroneal veins. When  compared to prior study, there is resolution  of the DVT seen at the level of the CFV/SFJ, however the remaining  segments of the left lower extremity demonstrate no significant change.  - No cystic structure found in the popliteal fossa.   EKG:  EKG not ordered today.   Recent Labs: 06/04/2021: Magnesium 1.9 08/06/2021: ALT 17; BUN 21; Creatinine 0.93; Hemoglobin 11.3; Platelet Count 345; Potassium 4.0; Sodium 143  Recent Lipid Panel    Component Value Date/Time   CHOL 169 08/30/2018 1049   TRIG 115.0 08/30/2018 1049   HDL 41.40 08/30/2018 1049   CHOLHDL 4 08/30/2018 1049   VLDL 23.0 08/30/2018 1049   LDLCALC 104 (H) 08/30/2018 1049    Physical Exam:    Vital Signs: There were no vitals taken for this visit.    Wt Readings from Last 3 Encounters:  08/06/21 170 lb 14.4 oz (77.5 kg)  08/05/21 170 lb (77.1 kg)  07/23/21 192 lb (87.1 kg)     General: 70 y.o. male in no acute distress. HEENT: Normocephalic and atraumatic. Sclera clear. EOMs intact. Neck: Supple. No carotid bruits. No JVD. Heart: *** RRR. Distinct S1 and S2. No murmurs, gallops, or rubs. Radial and  distal pedal pulses 2+ and equal bilaterally. Lungs: No increased work of breathing. Clear to ausculation bilaterally. No wheezes, rhonchi, or rales.  Abdomen: Soft, non-distended, and non-tender to palpation. Bowel sounds present in all 4 quadrants.  MSK: Normal strength and tone for age. *** Extremities: No lower extremity  edema.    Skin: Warm and dry. Neuro: Alert and oriented x3. No focal deficits. Psych: Normal affect. Responds appropriately.   Assessment:    No diagnosis found.  Plan:     Disposition: Follow up in ***   Medication Adjustments/Labs and Tests Ordered: Current medicines are reviewed at length with the patient today.  Concerns regarding medicines are outlined above.  No orders of the defined types were placed in this encounter.  No orders of the defined types were placed in this encounter.   There are no Patient Instructions on file for this visit.   Signed, Darreld Mclean, PA-C  08/18/2021 10:53 AM    [facility]

## 2021-08-20 ENCOUNTER — Encounter: Payer: Self-pay | Admitting: Urology

## 2021-08-20 ENCOUNTER — Other Ambulatory Visit (HOSPITAL_COMMUNITY): Payer: Self-pay

## 2021-08-20 NOTE — Progress Notes (Signed)
Patient reports chest pain 8/10 upon swallowing. No other symptoms reported at this time.  Meaningful use complete.  Patient notified of 9:00am-08/21/21 telephone appointment w/ Ashlyn Bruning PA-C and verbalized understanding.  Patient preferred contact # [phone]

## 2021-08-21 ENCOUNTER — Ambulatory Visit
Admission: RE | Admit: 2021-08-21 | Discharge: 2021-08-21 | Disposition: A | Discharge status: Home / Self Care | Payer: Medicare Other | Source: Ambulatory Visit | Attending: Urology | Admitting: Urology

## 2021-08-21 ENCOUNTER — Encounter: Payer: Self-pay | Admitting: Internal Medicine

## 2021-08-21 DIAGNOSIS — C3490 Malignant neoplasm of unspecified part of unspecified bronchus or lung: Secondary | ICD-10-CM

## 2021-08-21 DIAGNOSIS — C7951 Secondary malignant neoplasm of bone: Secondary | ICD-10-CM

## 2021-08-21 NOTE — Progress Notes (Addendum)
Error

## 2021-08-21 NOTE — Progress Notes (Signed)
°  Radiation Oncology         [phone]) [phone] ________________________________  Name: Ethan Brown MRN: [id number]  Date: 08/13/2021  DOB: 1951-05-01  End of Treatment Note  Diagnosis:   70 yo male with painful right hip metastases from Stage IV (T2b, N2, M1c) adenocarcinoma of the right middle lobe of the lung.     Indication for treatment:  Palliation       Radiation treatment dates:   07/31/21 - 08/13/21  Site/dose:   The painful disease in the right hip was treated to 30 Gy in 10 fractions.  Beams/energy:   A 3D field set-up was employed with 6 MV X-rays  Narrative: The patient tolerated radiation treatment relatively well.    Plan: The patient has completed radiation treatment. The patient will return to radiation oncology clinic for routine followup in one month. I advised him to call or return sooner if he has any questions or concerns related to his recovery or treatment. ________________________________  Sheral Apley. Tammi Klippel, M.D.

## 2021-08-21 NOTE — Progress Notes (Signed)
Radiation Oncology         [phone] ________________________________  Name: Ethan Brown MRN: [id number]  Date: 08/21/2021  DOB: [birth year]  Post Treatment Note  CC: Laurey Morale, MD  Curt Bears, MD  Diagnosis:   70 yo male with progressive Stage IV (T2b, N2, M1c) adenocarcinoma of the right middle lobe of the lung involving the brain and bones with 2 new subcentimeter brain metastases and a painful lesion in the right hip.  Interval Since Last Radiation:  1.5 weeks s/p palliative XRT to right hip, 1 month s/p SRS brain  07/31/21 - 08/13/21:   The painful disease in the right hip was treated to 30 Gy in 10 fractions.  SRS Brain PTV 5 and PTV 6 07/22/21:   12/18/20 - 12/31/20:  The whole brain was treated to 30 Gy in 10 fractions of 3 Gy (HCA)   07/03/20: SRS//PTV3: 34mm right temporal gyrus target was treated to a prescription dose of 20 Gy in a single fraction.              SRS//PTV4: 36mm left periventricular target was treated to a prescription dose of 20 Gy in a single fraction.      01/23/2020: SRS//PTV2: 71mm right thalamocapsular target was treated to a prescription dose of 20 Gy in a single fraction.     04/07/19: SRS// PTV1:  6 mm  right parietal target was treated to a prescription dose of 20 Gy in a single fraction.    Narrative: I spoke with the patient to conduct his routine scheduled 1 month follow up visit via telephone to spare the patient unnecessary potential exposure in the healthcare setting during the current COVID-19 pandemic.  The patient was notified in advance and gave permission to proceed with this visit format.  He tolerated the radiation treatments relatively well without any ill side effects.                              On review of systems, the patient states that he is doing well in general.  He specifically denies any new headaches, visual changes, difficulty with speech, dizziness/imbalance, tremor or seizure activity.  He continues with  intermittent confusion/difficulty remembering and interpreting information, which has been present since the time of his whole brain radiation treatment and is not worsening.  He reports significant improvement in the right hip pain since completing his recent palliative radiation and feels that this pain has almost completely resolved at this point.  He is now able to ambulate and be active without pain which he is quite pleased with.  Overall, he is pleased with his progress to date.  ALLERGIES:  has No Known Allergies.  Meds: Current Outpatient Medications  Medication Sig Dispense Refill   afatinib dimaleate (GILOTRIF) 30 MG tablet Take 1 tablet (30 mg total) by mouth daily. Take on an empty stomach 1hr before or 2hrs after meals. (Patient taking differently: Take 30 mg by mouth daily before breakfast.) 30 tablet 3   apixaban (ELIQUIS) 5 MG TABS tablet Take 1 tablet (5 mg total) by mouth 2 (two) times daily. 60 tablet 3   cycloSPORINE (RESTASIS) 0.05 % ophthalmic emulsion Place 1 drop into both eyes in the morning.     furosemide (LASIX) 20 MG tablet TAKE 2TABS IN THE MORNING AND 1 TAB IN THE EVENING 90 tablet 0   hydrochlorothiazide (MICROZIDE) 12.5 MG capsule TAKE 1 CAPSULE BY MOUTH  EVERY DAY (Patient not taking: Reported on 08/01/2021) 90 capsule 3   HYDROcodone bit-homatropine (HYCODAN) 5-1.5 MG/5ML syrup Take 5 mLs by mouth every 6 (six) hours as needed for cough. 473 mL 0   magic mouthwash SOLN Take 5 mLs by mouth 4 (four) times daily as needed for mouth pain. Swish and spit or swallow (Patient not taking: Reported on 08/06/2021) 240 mL 0   mirtazapine (REMERON) 15 MG tablet Take 1 tablet (15 mg total) by mouth at bedtime. (Patient not taking: Reported on 08/01/2021) 30 tablet 2   potassium chloride (KLOR-CON) 10 MEQ tablet TAKE 1 TABLET BY MOUTH EVERY DAY 30 tablet 0   prochlorperazine (COMPAZINE) 10 MG tablet Take 1 tablet (10 mg total) by mouth every 6 (six) hours as needed for nausea or  vomiting. (Patient not taking: Reported on 08/06/2021) 30 tablet 1   tamsulosin (FLOMAX) 0.4 MG CAPS capsule Take 0.4 mg by mouth at bedtime.     temazepam (RESTORIL) 15 MG capsule Take 1 capsule (15 mg total) by mouth at bedtime as needed for sleep. 30 capsule 0   No current facility-administered medications for this encounter.    Physical Findings:  vitals were not taken for this visit.  Pain Assessment Pain Score: 8  (chest pain upon swallowing)/10 Unable to assess due to telephone follow-up visit format.  Lab Findings: Lab Results  Component Value Date   WBC 4.9 08/06/2021   HGB 11.3 (L) 08/06/2021   HCT 35.1 (L) 08/06/2021   MCV 95.9 08/06/2021   PLT 345 08/06/2021     Radiographic Findings: DG Chest 2 View  Result Date: 08/06/2021 CLINICAL DATA:  70 year old male preoperative chest x-ray EXAM: CHEST - 2 VIEW COMPARISON:  07/16/2021 FINDINGS: Cardiomediastinal silhouette unchanged in size and contour. Unchanged position of right chest wall subclavian approach port catheter. Opacity at the right lung base with meniscus obscuring the right hemidiaphragm and the right heart border, similar to the prior. Reticular opacities of the right lung, similar distribution of the prior and somewhat improved. Blunting at the left costophrenic angle. Meniscus in the costophrenic sulcus on the lateral view. Thickening of the minor fissure. No pneumothorax. Degenerative changes of the spine.  No acute displaced fracture. IMPRESSION: Right-sided pleural effusion with associated atelectasis/consolidation, and evidence of lymphatic disease. Unchanged right chest wall port catheter. Trace left-sided pleural effusion Electronically Signed   By: Corrie Mckusick D.O.   On: 08/06/2021 14:09   MR HIP RIGHT WO CONTRAST  Result Date: 07/28/2021 CLINICAL DATA:  History of lung cancer.  Right hip pain. EXAM: MR OF THE RIGHT HIP WITHOUT CONTRAST TECHNIQUE: Multiplanar, multisequence MR imaging was performed. No  intravenous contrast was administered. COMPARISON:  None. FINDINGS: Significant patient motion degrades image quality limiting evaluation. Bones: No acute hip fracture or dislocation.  No avascular necrosis. Right intertrochanteric intramedullary bone lesion extending into the femoral neck involving greater than 75% of the intramedullary cavity with surrounding marrow edema measuring approximately 3.2 x 3.2 x 3.5 cm with an adjacent 10 mm bone lesion most consistent with metastatic disease. 3.3 cm bone lesion in the right lesser trochanter. Abnormal bone lesion in the right superior anterior acetabulum extending into the right superior pubic ramus with bone destruction with a pathologic fracture at the junction of the acetabulum and superior pubic ramus. 2.5 cm bone lesion in the left posterior acetabulum. 12 mm bone lesion in the left proximal femoral diaphysis. 8 mm bone lesion in the left superior acetabulum. Multiple subcentimeter bone lesions in  the ilium bilaterally. Largest bone lesion in the pelvis is located in the right posterior ilium. 15 mm bone lesion in the right L5 vertebral body consistent with metastatic disease. 8 mm ill-defined L4 vertebral body bone lesion. 10 mm bone lesion in the S2 vertebral body. Normal sacrum and sacroiliac joints. No SI joint widening or erosive changes. Degenerative disease with disc height loss of the visualized lumbar spine. Articular cartilage and labrum Articular cartilage: Partial-thickness cartilage loss of bilateral hips. Labrum:  Right superior labral degeneration. Joint or bursal effusion Joint effusion:  No hip joint effusion.  No SI joint effusion. Bursae:  No bursa formation. Muscles and tendons Flexors: Normal. Extensors: Normal. Abductors: Normal. Adductors: Mild edema in the right adductor musculature likely reactive. Gluteals: Normal. Hamstrings: Normal. Other findings No pelvic free fluid. No fluid collection or hematoma. No inguinal lymphadenopathy. No  inguinal hernia. Small left plantar diverticulum measuring 13 mm. IMPRESSION: 1. Numerous bone lesions involving the pelvis, proximal femurs, L3 vertebral body and L5 vertebral bodies consistent with metastatic disease given the patient's history of lung cancer. 2. Large focus of metastatic disease in the intertrochanteric aspect of the right femur involving approximately 7 5% of the intramedullary cavity and extending into the neck concerning for high risk for a pathologic fracture. 3. Minimally displaced pathologic fracture at the right superior pubic ramus-acetabular junction. 4. Mild-moderate osteoarthritis of bilateral hips. Electronically Signed   By: Kathreen Devoid M.D.   On: 07/28/2021 07:10    Impression/Plan: 29. 70 yo male with progressive Stage IV (T2b, N2, M1c) adenocarcinoma of the right middle lobe of the lung involving the brain and bones with 2 new subcentimeter brain metastases and a painful lesion in the right hip. He appears to have recovered well from the effects of his recent radiotherapy and is currently without complaints.  We discussed the plan to obtain a follow-up/posttreatment MRI brain scan in February 2023 and pending this scan is stable, we will resume serial MRI brain scans every 3 months to continue to monitor for any signs of disease recurrence or progression.  He will continue in routine follow-up under the care and direction of Dr. Earlie Server for continued management of his systemic disease.  I will plan to call him with the results of the MRI brain scans once they are available but he and his wife, [name], know that they are welcome to call at anytime in the interim with any questions or concerns related to radiation.  He and his wife appear to have a good understanding of these recommendations and are comfortable and in agreement with the stated plan.    Nicholos Johns, PA-C

## 2021-08-22 ENCOUNTER — Other Ambulatory Visit (HOSPITAL_COMMUNITY): Payer: Self-pay

## 2021-08-25 ENCOUNTER — Encounter (HOSPITAL_COMMUNITY): Payer: Self-pay

## 2021-08-25 ENCOUNTER — Other Ambulatory Visit: Payer: Self-pay

## 2021-08-25 ENCOUNTER — Emergency Department (HOSPITAL_BASED_OUTPATIENT_CLINIC_OR_DEPARTMENT_OTHER)
Admit: 2021-08-25 | Discharge: 2021-08-25 | Disposition: A | Discharge status: Home / Self Care | Payer: Medicare Other | Attending: Emergency Medicine | Admitting: Emergency Medicine

## 2021-08-25 ENCOUNTER — Emergency Department (HOSPITAL_COMMUNITY): Payer: Medicare Other

## 2021-08-25 ENCOUNTER — Inpatient Hospital Stay (HOSPITAL_COMMUNITY)
Admission: EM | Admit: 2021-08-25 | Discharge: 2021-08-30 | DRG: 180 | Disposition: A | Discharge status: Hospice | Payer: Medicare Other | Attending: Family Medicine | Admitting: Family Medicine

## 2021-08-25 DIAGNOSIS — Z8249 Family history of ischemic heart disease and other diseases of the circulatory system: Secondary | ICD-10-CM | POA: Diagnosis not present

## 2021-08-25 DIAGNOSIS — F05 Delirium due to known physiological condition: Secondary | ICD-10-CM | POA: Diagnosis not present

## 2021-08-25 DIAGNOSIS — I824Z2 Acute embolism and thrombosis of unspecified deep veins of left distal lower extremity: Secondary | ICD-10-CM | POA: Diagnosis present

## 2021-08-25 DIAGNOSIS — I444 Left anterior fascicular block: Secondary | ICD-10-CM | POA: Diagnosis present

## 2021-08-25 DIAGNOSIS — J910 Malignant pleural effusion: Secondary | ICD-10-CM | POA: Diagnosis not present

## 2021-08-25 DIAGNOSIS — J189 Pneumonia, unspecified organism: Secondary | ICD-10-CM | POA: Diagnosis present

## 2021-08-25 DIAGNOSIS — J90 Pleural effusion, not elsewhere classified: Secondary | ICD-10-CM | POA: Diagnosis not present

## 2021-08-25 DIAGNOSIS — Z9842 Cataract extraction status, left eye: Secondary | ICD-10-CM | POA: Diagnosis not present

## 2021-08-25 DIAGNOSIS — J690 Pneumonitis due to inhalation of food and vomit: Secondary | ICD-10-CM | POA: Diagnosis not present

## 2021-08-25 DIAGNOSIS — D849 Immunodeficiency, unspecified: Secondary | ICD-10-CM | POA: Diagnosis present

## 2021-08-25 DIAGNOSIS — Z9841 Cataract extraction status, right eye: Secondary | ICD-10-CM

## 2021-08-25 DIAGNOSIS — R0602 Shortness of breath: Secondary | ICD-10-CM | POA: Diagnosis not present

## 2021-08-25 DIAGNOSIS — R0902 Hypoxemia: Secondary | ICD-10-CM | POA: Diagnosis not present

## 2021-08-25 DIAGNOSIS — R778 Other specified abnormalities of plasma proteins: Secondary | ICD-10-CM | POA: Diagnosis present

## 2021-08-25 DIAGNOSIS — Z811 Family history of alcohol abuse and dependence: Secondary | ICD-10-CM

## 2021-08-25 DIAGNOSIS — Z79899 Other long term (current) drug therapy: Secondary | ICD-10-CM | POA: Diagnosis not present

## 2021-08-25 DIAGNOSIS — Z66 Do not resuscitate: Secondary | ICD-10-CM | POA: Diagnosis not present

## 2021-08-25 DIAGNOSIS — R4182 Altered mental status, unspecified: Secondary | ICD-10-CM | POA: Diagnosis not present

## 2021-08-25 DIAGNOSIS — Z7901 Long term (current) use of anticoagulants: Secondary | ICD-10-CM

## 2021-08-25 DIAGNOSIS — G9340 Encephalopathy, unspecified: Secondary | ICD-10-CM | POA: Diagnosis not present

## 2021-08-25 DIAGNOSIS — Z515 Encounter for palliative care: Secondary | ICD-10-CM

## 2021-08-25 DIAGNOSIS — L89151 Pressure ulcer of sacral region, stage 1: Secondary | ICD-10-CM | POA: Diagnosis present

## 2021-08-25 DIAGNOSIS — R54 Age-related physical debility: Secondary | ICD-10-CM | POA: Diagnosis present

## 2021-08-25 DIAGNOSIS — E440 Moderate protein-calorie malnutrition: Secondary | ICD-10-CM | POA: Diagnosis not present

## 2021-08-25 DIAGNOSIS — Z20822 Contact with and (suspected) exposure to covid-19: Secondary | ICD-10-CM | POA: Diagnosis present

## 2021-08-25 DIAGNOSIS — R404 Transient alteration of awareness: Secondary | ICD-10-CM

## 2021-08-25 DIAGNOSIS — R410 Disorientation, unspecified: Secondary | ICD-10-CM | POA: Diagnosis not present

## 2021-08-25 DIAGNOSIS — Z9889 Other specified postprocedural states: Secondary | ICD-10-CM | POA: Diagnosis not present

## 2021-08-25 DIAGNOSIS — M7989 Other specified soft tissue disorders: Secondary | ICD-10-CM | POA: Diagnosis not present

## 2021-08-25 DIAGNOSIS — C3491 Malignant neoplasm of unspecified part of right bronchus or lung: Principal | ICD-10-CM | POA: Diagnosis present

## 2021-08-25 DIAGNOSIS — C7931 Secondary malignant neoplasm of brain: Secondary | ICD-10-CM | POA: Diagnosis present

## 2021-08-25 DIAGNOSIS — I2699 Other pulmonary embolism without acute cor pulmonale: Secondary | ICD-10-CM | POA: Diagnosis not present

## 2021-08-25 DIAGNOSIS — C787 Secondary malignant neoplasm of liver and intrahepatic bile duct: Secondary | ICD-10-CM | POA: Diagnosis present

## 2021-08-25 DIAGNOSIS — Z538 Procedure and treatment not carried out for other reasons: Secondary | ICD-10-CM | POA: Diagnosis not present

## 2021-08-25 DIAGNOSIS — Z6823 Body mass index (BMI) 23.0-23.9, adult: Secondary | ICD-10-CM

## 2021-08-25 DIAGNOSIS — I248 Other forms of acute ischemic heart disease: Secondary | ICD-10-CM | POA: Diagnosis not present

## 2021-08-25 DIAGNOSIS — C7951 Secondary malignant neoplasm of bone: Secondary | ICD-10-CM | POA: Diagnosis not present

## 2021-08-25 DIAGNOSIS — I82512 Chronic embolism and thrombosis of left femoral vein: Secondary | ICD-10-CM | POA: Diagnosis not present

## 2021-08-25 DIAGNOSIS — I10 Essential (primary) hypertension: Secondary | ICD-10-CM | POA: Diagnosis not present

## 2021-08-25 DIAGNOSIS — Z8042 Family history of malignant neoplasm of prostate: Secondary | ICD-10-CM

## 2021-08-25 DIAGNOSIS — Z961 Presence of intraocular lens: Secondary | ICD-10-CM | POA: Diagnosis present

## 2021-08-25 DIAGNOSIS — E785 Hyperlipidemia, unspecified: Secondary | ICD-10-CM | POA: Diagnosis present

## 2021-08-25 DIAGNOSIS — L8990 Pressure ulcer of unspecified site, unspecified stage: Secondary | ICD-10-CM | POA: Diagnosis present

## 2021-08-25 DIAGNOSIS — I959 Hypotension, unspecified: Secondary | ICD-10-CM | POA: Diagnosis present

## 2021-08-25 DIAGNOSIS — J9601 Acute respiratory failure with hypoxia: Secondary | ICD-10-CM | POA: Diagnosis present

## 2021-08-25 DIAGNOSIS — Z781 Physical restraint status: Secondary | ICD-10-CM

## 2021-08-25 DIAGNOSIS — Z833 Family history of diabetes mellitus: Secondary | ICD-10-CM

## 2021-08-25 LAB — CBC WITH DIFFERENTIAL/PLATELET
Abs Immature Granulocytes: 0.04 10*3/uL (ref 0.00–0.07)
Basophils Absolute: 0 10*3/uL (ref 0.0–0.1)
Basophils Relative: 0 %
Eosinophils Absolute: 0 10*3/uL (ref 0.0–0.5)
Eosinophils Relative: 0 %
HCT: 40.3 % (ref 39.0–52.0)
Hemoglobin: 13.1 g/dL (ref 13.0–17.0)
Immature Granulocytes: 0 %
Lymphocytes Relative: 2 %
Lymphs Abs: 0.2 10*3/uL — ABNORMAL LOW (ref 0.7–4.0)
MCH: 31.5 pg (ref 26.0–34.0)
MCHC: 32.5 g/dL (ref 30.0–36.0)
MCV: 96.9 fL (ref 80.0–100.0)
Monocytes Absolute: 0.5 10*3/uL (ref 0.1–1.0)
Monocytes Relative: 4 %
Neutro Abs: 11.5 10*3/uL — ABNORMAL HIGH (ref 1.7–7.7)
Neutrophils Relative %: 94 %
Platelets: 279 10*3/uL (ref 150–400)
RBC: 4.16 MIL/uL — ABNORMAL LOW (ref 4.22–5.81)
RDW: 14.9 % (ref 11.5–15.5)
WBC: 12.3 10*3/uL — ABNORMAL HIGH (ref 4.0–10.5)
nRBC: 0 % (ref 0.0–0.2)

## 2021-08-25 LAB — TROPONIN I (HIGH SENSITIVITY)
Troponin I (High Sensitivity): 18 ng/L — ABNORMAL HIGH (ref <18)
Troponin I (High Sensitivity): 23 ng/L — ABNORMAL HIGH (ref <18)

## 2021-08-25 LAB — RESP PANEL BY RT-PCR (FLU A&B, COVID) ARPGX2
Influenza A by PCR: NEGATIVE
Influenza B by PCR: NEGATIVE
SARS Coronavirus 2 by RT PCR: NEGATIVE

## 2021-08-25 LAB — COMPREHENSIVE METABOLIC PANEL
ALT: 33 U/L (ref 0–44)
AST: 27 U/L (ref 15–41)
Albumin: 2.9 g/dL — ABNORMAL LOW (ref 3.5–5.0)
Alkaline Phosphatase: 230 U/L — ABNORMAL HIGH (ref 38–126)
Anion gap: 5 (ref 5–15)
BUN: 25 mg/dL — ABNORMAL HIGH (ref 8–23)
CO2: 26 mmol/L (ref 22–32)
Calcium: 8.4 mg/dL — ABNORMAL LOW (ref 8.9–10.3)
Chloride: 106 mmol/L (ref 98–111)
Creatinine, Ser: 1.37 mg/dL — ABNORMAL HIGH (ref 0.61–1.24)
GFR, Estimated: 55 mL/min — ABNORMAL LOW (ref >60)
Glucose, Bld: 106 mg/dL — ABNORMAL HIGH (ref 70–99)
Potassium: 3.5 mmol/L (ref 3.5–5.1)
Sodium: 137 mmol/L (ref 135–145)
Total Bilirubin: 0.9 mg/dL (ref 0.3–1.2)
Total Protein: 6.1 g/dL — ABNORMAL LOW (ref 6.5–8.1)

## 2021-08-25 LAB — LACTIC ACID, PLASMA
Lactic Acid, Venous: 2.3 mmol/L (ref 0.5–1.9)
Lactic Acid, Venous: 2.1 mmol/L (ref 0.5–1.9)

## 2021-08-25 LAB — MAGNESIUM: Magnesium: 1.8 mg/dL (ref 1.7–2.4)

## 2021-08-25 LAB — BRAIN NATRIURETIC PEPTIDE: B Natriuretic Peptide: 15.2 pg/mL (ref 0.0–100.0)

## 2021-08-25 MED ORDER — LORAZEPAM 1 MG PO TABS
1.0000 mg | ORAL_TABLET | Freq: Every evening | ORAL | Status: DC | PRN
  Administered 2021-08-27: 1 mg via ORAL
  Filled 2021-08-25: qty 1

## 2021-08-25 MED ORDER — TAMSULOSIN HCL 0.4 MG PO CAPS
0.4000 mg | ORAL_CAPSULE | Freq: Every day | ORAL | Status: DC
  Administered 2021-08-25 – 2021-08-29 (×5): 0.4 mg via ORAL
  Filled 2021-08-25 (×5): qty 1

## 2021-08-25 MED ORDER — APIXABAN 5 MG PO TABS
5.0000 mg | ORAL_TABLET | Freq: Two times a day (BID) | ORAL | Status: DC
  Administered 2021-08-25 – 2021-08-29 (×9): 5 mg via ORAL
  Filled 2021-08-25 (×10): qty 1

## 2021-08-25 MED ORDER — PIPERACILLIN-TAZOBACTAM 3.375 G IVPB
3.3750 g | Freq: Three times a day (TID) | INTRAVENOUS | Status: DC
  Administered 2021-08-25 – 2021-08-30 (×14): 3.375 g via INTRAVENOUS
  Filled 2021-08-25 (×14): qty 50

## 2021-08-25 MED ORDER — ACETAMINOPHEN 650 MG RE SUPP: 650.0000 mg | Freq: Four times a day (QID) | RECTAL | Status: DC | PRN

## 2021-08-25 MED ORDER — ACETAMINOPHEN 325 MG PO TABS: 650.0000 mg | ORAL_TABLET | Freq: Four times a day (QID) | ORAL | Status: DC | PRN

## 2021-08-25 MED ORDER — IOHEXOL 350 MG/ML SOLN
75.0000 mL | Freq: Once | INTRAVENOUS | Status: AC | PRN
  Administered 2021-08-25: 75 mL via INTRAVENOUS

## 2021-08-25 MED ORDER — CYCLOSPORINE 0.05 % OP EMUL
1.0000 [drp] | Freq: Every day | OPHTHALMIC | Status: DC
  Administered 2021-08-26 – 2021-08-30 (×5): 1 [drp] via OPHTHALMIC
  Filled 2021-08-25 (×5): qty 30

## 2021-08-25 MED ORDER — ONDANSETRON HCL 4 MG/2ML IJ SOLN: 4.0000 mg | Freq: Four times a day (QID) | INTRAMUSCULAR | Status: DC | PRN

## 2021-08-25 MED ORDER — PIPERACILLIN-TAZOBACTAM 3.375 G IVPB 30 MIN
3.3750 g | Freq: Once | INTRAVENOUS | Status: AC
  Administered 2021-08-25: 3.375 g via INTRAVENOUS
  Filled 2021-08-25: qty 50

## 2021-08-25 MED ORDER — ONDANSETRON HCL 4 MG PO TABS: 4.0000 mg | ORAL_TABLET | Freq: Four times a day (QID) | ORAL | Status: DC | PRN

## 2021-08-25 MED ORDER — HYDROCODONE BIT-HOMATROP MBR 5-1.5 MG/5ML PO SOLN
5.0000 mL | Freq: Four times a day (QID) | ORAL | Status: DC | PRN
  Administered 2021-08-26 – 2021-08-27 (×2): 5 mL via ORAL
  Filled 2021-08-25 (×3): qty 5

## 2021-08-25 NOTE — ED Provider Notes (Signed)
[place] DEPT Provider Note   CSN: [id number] Arrival date & time: 08/25/21  0900     History  Chief Complaint  Patient presents with   Altered Mental Status   Shortness of Breath    Ethan Brown is a 71 y.o. male.  The history is provided by the patient, medical records and the spouse. No language interpreter was used.  Altered Mental Status Presenting symptoms: confusion   Severity:  Moderate Most recent episode:  Yesterday Episode history:  Continuous Progression:  Resolved Chronicity:  New Associated symptoms: agitation (resolved)   Associated symptoms: no abdominal pain, no fever, no hallucinations, no headaches, no light-headedness, no nausea, no palpitations, no rash, no vomiting and no weakness   Shortness of Breath Severity:  Moderate Onset quality:  Gradual Duration:  1 day Timing:  Constant Progression:  Improving Context: not URI   Relieved by:  Nothing Worsened by:  Nothing Associated symptoms: cough   Associated symptoms: no abdominal pain, no chest pain, no fever, no headaches, no rash, no vomiting and no wheezing       Home Medications Prior to Admission medications   Medication Sig Start Date End Date Taking? Authorizing Provider  afatinib dimaleate (GILOTRIF) 30 MG tablet Take 1 tablet (30 mg total) by mouth daily. Take on an empty stomach 1hr before or 2hrs after meals. Patient taking differently: Take 30 mg by mouth daily before breakfast. 07/16/21   Curt Bears, MD  apixaban (ELIQUIS) 5 MG TABS tablet Take 1 tablet (5 mg total) by mouth 2 (two) times daily. 06/09/21   Heilingoetter, Cassandra L, PA-C  cycloSPORINE (RESTASIS) 0.05 % ophthalmic emulsion Place 1 drop into both eyes in the morning.    [provider]  furosemide (LASIX) 20 MG tablet TAKE 2TABS IN THE MORNING AND 1 TAB IN THE EVENING 08/12/21   Curt Bears, MD  hydrochlorothiazide (MICROZIDE) 12.5 MG capsule TAKE 1 CAPSULE BY  MOUTH EVERY DAY Patient not taking: Reported on 08/01/2021 01/06/21   Leonie Man, MD  HYDROcodone bit-homatropine (HYCODAN) 5-1.5 MG/5ML syrup Take 5 mLs by mouth every 6 (six) hours as needed for cough. 05/07/21   Curt Bears, MD  magic mouthwash SOLN Take 5 mLs by mouth 4 (four) times daily as needed for mouth pain. Swish and spit or swallow Patient not taking: Reported on 08/06/2021 01/14/21   Heilingoetter, Cassandra L, PA-C  mirtazapine (REMERON) 15 MG tablet Take 1 tablet (15 mg total) by mouth at bedtime. Patient not taking: Reported on 08/01/2021 03/19/21   Heilingoetter, Cassandra L, PA-C  potassium chloride (KLOR-CON) 10 MEQ tablet TAKE 1 TABLET BY MOUTH EVERY DAY 08/12/21   Heilingoetter, Cassandra L, PA-C  prochlorperazine (COMPAZINE) 10 MG tablet Take 1 tablet (10 mg total) by mouth every 6 (six) hours as needed for nausea or vomiting. Patient not taking: Reported on 08/06/2021 05/21/20   Curt Bears, MD  tamsulosin (FLOMAX) 0.4 MG CAPS capsule Take 0.4 mg by mouth at bedtime. 12/22/19   [provider]  temazepam (RESTORIL) 15 MG capsule Take 1 capsule (15 mg total) by mouth at bedtime as needed for sleep. 08/06/21   Curt Bears, MD      Allergies    Patient has no known allergies.    Review of Systems   Review of Systems  Constitutional:  Positive for chills and fatigue. Negative for fever.  HENT:  Negative for congestion.   Respiratory:  Positive for cough and shortness of breath. Negative for chest  tightness and wheezing.   Cardiovascular:  Positive for leg swelling. Negative for chest pain and palpitations.  Gastrointestinal:  Negative for abdominal pain, diarrhea, nausea and vomiting.  Musculoskeletal:  Negative for back pain.  Skin:  Negative for rash.  Neurological:  Negative for weakness, light-headedness, numbness and headaches.  Psychiatric/Behavioral:  Positive for agitation (resolved) and confusion. Negative for hallucinations.   All other  systems reviewed and are negative.  Physical Exam Updated Vital Signs BP (!) 85/60 (BP Location: Right Arm)    Pulse (!) 132    Temp 97.6 F (36.4 C) (Oral)    Resp 18    SpO2 94%  Physical Exam Vitals and nursing note reviewed.  Constitutional:      General: He is not in acute distress.    Appearance: He is well-developed. He is ill-appearing. He is not toxic-appearing or diaphoretic.  HENT:     Head: Normocephalic and atraumatic.  Eyes:     Conjunctiva/sclera: Conjunctivae normal.     Pupils: Pupils are equal, round, and reactive to light.  Cardiovascular:     Rate and Rhythm: Regular rhythm. Tachycardia present.     Heart sounds: No murmur heard. Pulmonary:     Effort: Tachypnea present. No respiratory distress.     Breath sounds: Decreased breath sounds, rhonchi and rales present. No wheezing.  Chest:     Chest wall: No tenderness.  Abdominal:     Palpations: Abdomen is soft.     Tenderness: There is no abdominal tenderness.  Musculoskeletal:        General: No swelling.     Cervical back: Neck supple.     Right lower leg: No tenderness. No edema.     Left lower leg: No tenderness. Edema present.  Skin:    General: Skin is warm and dry.     Capillary Refill: Capillary refill takes less than 2 seconds.     Findings: No erythema.  Neurological:     General: No focal deficit present.     Mental Status: He is alert.  Psychiatric:        Mood and Affect: Mood normal. Mood is not anxious.    ED Results / Procedures / Treatments   Labs (all labs ordered are listed, but only abnormal results are displayed) Labs Reviewed  CBC WITH DIFFERENTIAL/PLATELET - Abnormal; Notable for the following components:      Result Value   WBC 12.3 (*)    RBC 4.16 (*)    Neutro Abs 11.5 (*)    Lymphs Abs 0.2 (*)    All other components within normal limits  COMPREHENSIVE METABOLIC PANEL - Abnormal; Notable for the following components:   Glucose, Bld 106 (*)    BUN 25 (*)     Creatinine, Ser 1.37 (*)    Calcium 8.4 (*)    Total Protein 6.1 (*)    Albumin 2.9 (*)    Alkaline Phosphatase 230 (*)    GFR, Estimated 55 (*)    All other components within normal limits  LACTIC ACID, PLASMA - Abnormal; Notable for the following components:   Lactic Acid, Venous 2.3 (*)    All other components within normal limits  TROPONIN I (HIGH SENSITIVITY) - Abnormal; Notable for the following components:   Troponin I (High Sensitivity) 18 (*)    All other components within normal limits  RESP PANEL BY RT-PCR (FLU A&B, COVID) ARPGX2  CULTURE, BLOOD (ROUTINE X 2)  CULTURE, BLOOD (ROUTINE X 2)  BRAIN NATRIURETIC  PEPTIDE  MAGNESIUM  LACTIC ACID, PLASMA  TROPONIN I (HIGH SENSITIVITY)    EKG EKG Interpretation  Date/Time:  Monday August 25 2021 09:36:13 EST Ventricular Rate:  134 PR Interval:  90 QRS Duration: 115 QT Interval:  307 QTC Calculation: 459 R Axis:   -71 Text Interpretation: Sinus or ectopic atrial tachycardia Ventricular premature complex Left anterior fascicular block Low voltage, precordial leads Consider anterior infarct Abnrm T, consider ischemia, anterolateral lds When compared to prior, faster rate. No STEMI  Confirmed by Antony Blackbird [phone]) on 08/25/2021 9:45:36 AM  Radiology CT Angio Chest PE W and/or Wo Contrast  Result Date: 08/25/2021 CLINICAL DATA:  Pulmonary embolism. History of lung cancer. Hypoxia EXAM: CT ANGIOGRAPHY CHEST WITH CONTRAST TECHNIQUE: Multidetector CT imaging of the chest was performed using the standard protocol during bolus administration of intravenous contrast. Multiplanar CT image reconstructions and MIPs were obtained to evaluate the vascular anatomy. CONTRAST:  41mL OMNIPAQUE IOHEXOL 350 MG/ML SOLN COMPARISON:  06/23/2021 FINDINGS: Cardiovascular: Satisfactory opacification of the pulmonary arteries to the segmental level. No evidence of pulmonary embolism. Normal heart size. No pericardial effusion. Porta catheter in place.  Mediastinum/Nodes: Obstructed bronchus intermedius with central low-density appearance attributed to debris. Study is timed for arterial enhancement rather than parenchymal enhancement, cannot exclude superimposed airway tumor/infiltration. Patulous upper esophagus. No clear esophageal wall thickening Lungs/Pleura: Worsening aeration on the right where there is diffuse opacification, ground-glass density, and reticulonodular appearance best seen at the pleura and apical aerated lung. Large left pleural effusion. Moderate right pleural effusion loculated at the base with pleural thickening. Upper Abdomen: No acute finding Musculoskeletal: Extensive sclerotic metastatic disease involving ribs and spine. Review of the MIP images confirms the above findings. IMPRESSION: 1. Debris obstructs the bronchus intermedius with worsening aeration at the right base that is likely pneumonia superimposed on pre-existing tumor related opacity. 2. Negative for pulmonary embolism. 3. Continued large left and moderate but complex right pleural effusion. Electronically Signed   By: Jorje Guild M.D.   On: 08/25/2021 11:19   DG Chest Portable 1 View  Result Date: 08/25/2021 CLINICAL DATA:  Short of breath.  Hypoxia.  Known lung carcinoma. EXAM: PORTABLE CHEST 1 VIEW COMPARISON:  08/05/2021 and older studies.  CT, 06/23/2021. FINDINGS: Hazy lung opacity lies in the left peripheral mid lung, increased from the prior exam. There is also hazy opacity at the left lung base that has increased. Small left effusion. Right pleural effusion, lung base opacity and interstitial thickening is stable. There is hazy airspace opacity in the right mid to upper lung that is new. Volume loss on the right is stable. No pneumothorax. Stable right anterior chest wall Port-A-Cath. IMPRESSION: 1. Interval worsening of lung aeration. 2. Hazy areas of airspace opacity have developed in the mid and lower lungs, on the right superimposed on the chronic  changes related to lung carcinoma. Suspect multifocal infection or inflammation as the etiology. 3. Bilateral pleural effusions without convincing change. Electronically Signed   By: Lajean Manes M.D.   On: 08/25/2021 10:37   VAS [place] LOWER EXTREMITY VENOUS (DVT) (ONLY [facility] & [facility])  Result Date: 08/25/2021  Lower Venous DVT Study Patient Name:  Ethan Brown [facility]  Date of Exam:   08/25/2021 Medical Rec #: [id number]              Accession #:    [id number] Date of Birth: 05-Aug-1951               Patient Gender: M Patient Age:  70 years Exam Location:  [facility] Procedure:      VAS [place] LOWER EXTREMITY VENOUS (DVT) Referring Phys: Marda Stalker --------------------------------------------------------------------------------  Indications: Swelling.  Risk Factors: Cancer. Limitations: Poor ultrasound/tissue interface. Comparison Study: 03/28/2021 - RIGHT:                   - No evidence of common femoral vein obstruction.                    LEFT:                   - Findings consistent with acute deep vein thrombosis                   involving the left                   common femoral vein, SF junction, left femoral vein, left                   proximal                   profunda vein, left popliteal vein, left posterior tibial                   veins, left                   peroneal veins, and left gastrocnemius                   veins.                   - Findings consistent with acute superficial vein thrombosis                   involving the                   left small saphenous vein.                   - No cystic structure found in the popliteal fossa.                    06/05/2021 - RIGHT:                   - No evidence of common femoral vein obstruction.                    LEFT:                   - Findings consistent with age indeterminate deep vein                   thrombosis                   involving the left femoral vein, left proximal profunda vein,                   left                    popliteal vein, left posterior tibial veins, and left peroneal                   veins. When                   compared to prior study, there is resolution  of the DVT seen at the level of the CFV/SFJ, however the                   remaining                   segments of the left lower extremity demonstrate no                   significant change.                   - No cystic structure found in the popliteal fossa. Performing Technologist: Oliver Hum RVT  Examination Guidelines: A complete evaluation includes B-mode imaging, spectral Doppler, color Doppler, and power Doppler as needed of all accessible portions of each vessel. Bilateral testing is considered an integral part of a complete examination. Limited examinations for reoccurring indications may be performed as noted. The reflux portion of the exam is performed with the patient in reverse Trendelenburg.  +-----+---------------+---------+-----------+----------+--------------+  RIGHT Compressibility Phasicity Spontaneity Properties Thrombus Aging  +-----+---------------+---------+-----------+----------+--------------+  CFV   Full            Yes       Yes                                    +-----+---------------+---------+-----------+----------+--------------+   +---------+---------------+---------+-----------+----------+-------------------+  LEFT      Compressibility Phasicity Spontaneity Properties Thrombus Aging       +---------+---------------+---------+-----------+----------+-------------------+  CFV       Full            Yes       Yes                                         +---------+---------------+---------+-----------+----------+-------------------+  SFJ       Full            No        No                     Chronic              +---------+---------------+---------+-----------+----------+-------------------+  FV Prox   None            No        No                     Chronic               +---------+---------------+---------+-----------+----------+-------------------+  FV Mid    None            No        No                     Chronic              +---------+---------------+---------+-----------+----------+-------------------+  FV Distal None            No        No                     Chronic              +---------+---------------+---------+-----------+----------+-------------------+  PFV       Partial         Yes  Yes                    Chronic              +---------+---------------+---------+-----------+----------+-------------------+  POP       None            No        No                     Chronic              +---------+---------------+---------+-----------+----------+-------------------+  PTV       None                                             Chronic              +---------+---------------+---------+-----------+----------+-------------------+  PERO                                                       Not well visualized  +---------+---------------+---------+-----------+----------+-------------------+  Gastroc   None                                             Chronic              +---------+---------------+---------+-----------+----------+-------------------+  SSV       None                                             Chronic              +---------+---------------+---------+-----------+----------+-------------------+     Summary: RIGHT: - No evidence of common femoral vein obstruction.  LEFT: - Findings consistent with chronic deep vein thrombosis involving the left femoral vein, left proximal profunda vein, left popliteal vein, left posterior tibial veins, and left gastrocnemius veins. - Findings consistent with chronic superficial vein thrombosis involving the left small saphenous vein. - No cystic structure found in the popliteal fossa.  *See table(s) above for measurements and observations. Electronically signed by Monica Martinez MD on 08/25/2021 at 12:20:31 PM.    Final      Procedures Procedures    CRITICAL CARE Performed by: Gwenyth Allegra Mattisen Pohlmann Total critical care time: 35 minutes Critical care time was exclusive of separately billable procedures and treating other patients. Critical care was necessary to treat or prevent imminent or life-threatening deterioration. Critical care was time spent personally by me on the following activities: development of treatment plan with patient and/or surrogate as well as nursing, discussions with consultants, evaluation of patient's response to treatment, examination of patient, obtaining history from patient or surrogate, ordering and performing treatments and interventions, ordering and review of laboratory studies, ordering and review of radiographic studies, pulse oximetry and re-evaluation of patient's condition.   Medications Ordered in ED Medications  ondansetron (ZOFRAN) tablet 4 mg (has no administration in time range)    Or  ondansetron (ZOFRAN) injection 4 mg (has no administration in time range)  acetaminophen (TYLENOL) tablet 650 mg (has no administration in time range)    Or  acetaminophen (TYLENOL) suppository 650 mg (has no administration in time range)  iohexol (OMNIPAQUE) 350 MG/ML injection 75 mL (75 mLs Intravenous Contrast Given 08/25/21 1046)  piperacillin-tazobactam (ZOSYN) IVPB 3.375 g (3.375 g Intravenous New Bag/Given 08/25/21 1314)      ED Course/ Medical Decision Making/ A&P                           Medical Decision Making  Kadan Millstein is a 71 y.o. male with a past medical history significant for known lung cancer on chemotherapy with bony and intracranial mets, previous malignant pleural effusion requiring thoracentesis, DVT currently on Eliquis therapy, hypertension, hyperlipidemia, and migraines who presents with acute shortness of breath, tachycardia, tachypnea, and hypoxia.  According to patient and family, since yesterday, patient has had some confusion and shortness of  breath.  Patient was working to breathe and wife could not get the home pulse oximeter to get a reading.  He was acting more confused today prompting him to come in.  Patient was found to be tachycardic between 130 and 150 on arrival as well as hypotension has resolved during my exam and tachypnea.  He is not having any reported fevers, chills, or congestion but does have a dry cough.  Patient is denying any chest pain but does report the shortness of breath.  They report that he was post to have a thoracentesis and Pleur-evac placement with cardiothoracic surgery on the 16th, about 2 and half weeks ago, when the x-ray showed some improvement in the fluid and they decided to postpone.  They did hold the Eliquis for 2 days in preparation for that procedure.  Patient had done fairly well for the last 2 weeks until last night when the breathing worsened.  Family does say that his left leg has been more swollen.  He denies any constipation, diarrhea, or urinary changes.  He has been taking his chemotherapy as directed orally.  On my exam, patient is tachycardic and tachypneic.  Oxygen saturations are varying but when I sat him up to auscultate it dropped to 82% on room air.  He does not take oxygen currently.  Will place on nasal cannula oxygen supplementation.  Lungs had diminished breath sounds on both sides but worse on the right than her left.  There was some rhonchi otherwise.  Chest and back were nontender.  Abdomen was nontender.  Legs were edematous with left greater than right.  Intact pulses distally.  Patient is alert and oriented and wife says that he is back to his baseline now that he is on oxygen and getting assessed.  Clinically suspect hypoxia led to some altered mental status and confusion.  He is denying any headache.  I spoke with family and they do not think this is an intracranial problem and agree to hold on head CT given his lack of trauma, headache, and back to baseline now that he is on  oxygen.  I suspect that he either has a pneumonia, hypoxia due to recurrence of the malignant effusion, or even a pulmonary embolism given the known DVT, recent holding of Eliquis, and worsened leg swelling.  We will get an ultrasound of the leg to look for worsening clot and will also get both chest x-ray and a CT PE study if his kidney function and labs will help.  We will get other labs  including COVID/flu test.  Given the hypoxia, transient confusion, and abnormal vital signs I do anticipate he will need admission.  12:40 PM Patient's work-up returned and does show evidence of pneumonia.  There is no evidence of new pulmonary embolism and his ultrasound not show any new clot but showed old clot in the leg.  Looks like there is some debris in the bronchus so this suggestive of a postobstructive pneumonia or pneumonia related cancer.  There is also pleural effusions recurring.  Patient is remaining on oxygen and is still having some waxing waning confusion.  Due to his hypoxia, vital signs, and new pneumonia while immunosuppressed chemotherapy, patient will need admission.  I spoke with pharmacy who recommended Zosyn and we will call for admission.  Patient may end up needing the thoracentesis/Pleur-evac placement as previously planned but will defer to the admitting team to discuss with cardiothoracic surgery after his pneumonia starts to improve.  Since arrival, his previous hypotension has not been present and he appears stable in the room at this time.  Patient will be admitted.         Final Clinical Impression(s) / ED Diagnoses Final diagnoses:  Transient alteration of awareness  Hypoxia  Shortness of breath  Postobstructive pneumonia  Pleural effusion    Rx / DC Orders ED Discharge Orders     None       Clinical Impression: 1. Transient alteration of awareness   2. Hypoxia   3. Shortness of breath   4. Postobstructive pneumonia   5. Pleural effusion      Disposition: Admit  This note was prepared with assistance of Dragon voice recognition software. Occasional wrong-word or sound-a-like substitutions may have occurred due to the inherent limitations of voice recognition software.     Kayti Poss, Gwenyth Allegra, MD 08/25/21 [phone]

## 2021-08-25 NOTE — Progress Notes (Deleted)
Office Visit    Patient Name: Ethan Brown Date of Encounter: 08/25/2021  Primary Care Provider:  Laurey Morale, MD Primary Cardiologist:  Glenetta Hew, MD  Chief Complaint    ***  Past Medical History    Past Medical History:  Diagnosis Date   Borderline hypertension    Brain metastasis ([place]) dx'd 01/2019   Detached vitreous humor, left    DVT (deep venous thrombosis) ([place])    left knee area   Dyspnea    ED (erectile dysfunction) of organic origin    Hyperlipidemia    Hypertension    Lens subluxation, left    Lung cancer ([place]) dx'd 01/2019   Stage IV   Migraine headache    takes PRN Imitrex   Pseudophakia    TGA (transient global amnesia) 2017   Past Surgical History:  Procedure Laterality Date   CAROTID DOPPLERS  09/2017   Mild non-obstructive disease   CATARACT EXTRACTION, BILATERAL     cataract, left  2018   CHEST TUBE INSERTION Right 03/29/2019   Procedure: INSERTION PLEURAL DRAINAGE CATHETER;  Surgeon: [name], MD;  Location: [facility];  Service: Thoracic;  Laterality: Right;   COLONOSCOPY  2016   clear, repeat in 10 yrs    Eye surgeries     , Lens attachment.  Vitrectomy   FEMORAL HERNIA REPAIR     HERNIA REPAIR     IR THORACENTESIS ASP PLEURAL SPACE W/IMG GUIDE  02/22/2019   IR THORACENTESIS ASP PLEURAL SPACE W/IMG GUIDE  03/13/2019   IR THORACENTESIS ASP PLEURAL SPACE W/IMG GUIDE  03/18/2020   LUMBAR LAMINECTOMY     PLEURAL BIOPSY Right 03/29/2019   Procedure: PLEURAL BIOPSY;  Surgeon: [name], MD;  Location: [facility];  Service: Thoracic;  Laterality: Right;   PORTACATH PLACEMENT N/A 03/29/2019   Procedure: INSERTION PORT-A-CATH;  Surgeon: [name], MD;  Location: [facility];  Service: Thoracic;  Laterality: N/A;   retinal attachment, right     sclearl buckle, right     TRANSTHORACIC ECHOCARDIOGRAM  12/14/2019   EF 50 EF 55% (low normal).  Abnormal septal wall motion related to BBB; indeterminate diastolic  parameters.  Mild RV enlargement with mildly reduced function.  Normal valves.  Normal atrial sizes.   victrectomy,right     VIDEO ASSISTED THORACOSCOPY Right 03/29/2019   Procedure: VIDEO ASSISTED THORACOSCOPY;  Surgeon: [name], MD;  Location: [facility];  Service: Thoracic;  Laterality: Right;    Allergies  No Known Allergies  History of Present Illness    ***  Home Medications    No current facility-administered medications for this visit.   Current Outpatient Medications  Medication Sig Dispense Refill   afatinib dimaleate (GILOTRIF) 30 MG tablet Take 1 tablet (30 mg total) by mouth daily. Take on an empty stomach 1hr before or 2hrs after meals. (Patient taking differently: Take 30 mg by mouth daily before breakfast.) 30 tablet 3   apixaban (ELIQUIS) 5 MG TABS tablet Take 1 tablet (5 mg total) by mouth 2 (two) times daily. 60 tablet 3   cycloSPORINE (RESTASIS) 0.05 % ophthalmic emulsion Place 1 drop into both eyes in the morning.     diphenhydrAMINE (BENADRYL) 25 MG tablet Take 50 mg by mouth at bedtime as needed for sleep.     furosemide (LASIX) 20 MG tablet TAKE 2TABS IN THE MORNING AND 1 TAB IN THE EVENING (Patient taking differently: Take 20-40 mg by mouth See admin instructions. TAKE  2TABS IN THE MORNING AND 1 TAB IN THE EVENING) 90 tablet 0   hydrochlorothiazide (MICROZIDE) 12.5 MG capsule TAKE 1 CAPSULE BY MOUTH EVERY DAY (Patient not taking: Reported on 08/25/2021) 90 capsule 3   HYDROcodone bit-homatropine (HYCODAN) 5-1.5 MG/5ML syrup Take 5 mLs by mouth every 6 (six) hours as needed for cough. 473 mL 0   LORazepam (ATIVAN) 1 MG tablet Take 1 mg by mouth at bedtime as needed for anxiety or sleep.     magic mouthwash SOLN Take 5 mLs by mouth 4 (four) times daily as needed for mouth pain. Swish and spit or swallow 240 mL 0   Multiple Vitamins-Minerals (MULTIVITAMIN WITH MINERALS) tablet Take 1 tablet by mouth daily.     potassium chloride (KLOR-CON) 10 MEQ tablet TAKE  1 TABLET BY MOUTH EVERY DAY (Patient taking differently: 10 mEq daily.) 30 tablet 0   prochlorperazine (COMPAZINE) 10 MG tablet Take 1 tablet (10 mg total) by mouth every 6 (six) hours as needed for nausea or vomiting. 30 tablet 1   tamsulosin (FLOMAX) 0.4 MG CAPS capsule Take 0.4 mg by mouth at bedtime.     temazepam (RESTORIL) 15 MG capsule Take 1 capsule (15 mg total) by mouth at bedtime as needed for sleep. (Patient not taking: Reported on 08/25/2021) 30 capsule 0   Facility-Administered Medications Ordered in Other Visits  Medication Dose Route Frequency Provider Last Rate Last Admin   acetaminophen (TYLENOL) tablet 650 mg  650 mg Oral Q6H PRN Reubin Milan, MD       Or   acetaminophen (TYLENOL) suppository 650 mg  650 mg Rectal Q6H PRN Reubin Milan, MD       apixaban [facility]) tablet 5 mg  5 mg Oral BID Reubin Milan, MD       [START ON 08/26/2021] cycloSPORINE (RESTASIS) 0.05 % ophthalmic emulsion 1 drop  1 drop Both Eyes q AM Reubin Milan, MD       HYDROcodone bit-homatropine [facility]) 5-1.5 MG/5ML syrup 5 mL  5 mL Oral Q6H PRN Reubin Milan, MD       LORazepam (ATIVAN) tablet 1 mg  1 mg Oral QHS PRN Reubin Milan, MD       ondansetron [facility]) tablet 4 mg  4 mg Oral Q6H PRN Reubin Milan, MD       Or   ondansetron [facility]) injection 4 mg  4 mg Intravenous Q6H PRN Reubin Milan, MD       piperacillin-tazobactam (ZOSYN) IVPB 3.375 g  3.375 g Intravenous Q8H Suzzanne Cloud, RPH       tamsulosin [facility]) capsule 0.4 mg  0.4 mg Oral QHS Reubin Milan, MD         Review of Systems    ***.  All other systems reviewed and are otherwise negative except as noted above.    Physical Exam    VS:  There were no vitals taken for this visit. , BMI There is no height or weight on file to calculate BMI.     GEN: Well nourished, well developed, in no acute distress. HEENT: normal. Neck: Supple, no JVD, carotid bruits, or masses. Cardiac: RRR,  no murmurs, rubs, or gallops. No clubbing, cyanosis, edema.  Radials/DP/PT 2+ and equal bilaterally.  Respiratory:  Respirations regular and unlabored, clear to auscultation bilaterally. GI: Soft, nontender, nondistended, BS + x 4. MS: no deformity or atrophy. Skin: warm and dry, no rash. Neuro:  Strength and sensation are intact. Psych: Normal  affect.  Accessory Clinical Findings    ECG personally reviewed by me today - *** - no acute changes.  Lab Results  Component Value Date   WBC 12.3 (H) 08/25/2021   HGB 13.1 08/25/2021   HCT 40.3 08/25/2021   MCV 96.9 08/25/2021   PLT 279 08/25/2021   Lab Results  Component Value Date   CREATININE 1.37 (H) 08/25/2021   BUN 25 (H) 08/25/2021   NA 137 08/25/2021   K 3.5 08/25/2021   CL 106 08/25/2021   CO2 26 08/25/2021   Lab Results  Component Value Date   ALT 33 08/25/2021   AST 27 08/25/2021   ALKPHOS 230 (H) 08/25/2021   BILITOT 0.9 08/25/2021   Lab Results  Component Value Date   CHOL 169 08/30/2018   HDL 41.40 08/30/2018   LDLCALC 104 (H) 08/30/2018   TRIG 115.0 08/30/2018   CHOLHDL 4 08/30/2018    No results found for: HGBA1C  Assessment & Plan    1.  ***   Lenna Sciara, NP 08/25/2021, 6:51 PM

## 2021-08-25 NOTE — ED Triage Notes (Signed)
Pts wife reports last night pt had sudden onset of confusion and weakness. Pt was supposed to have pleura-vac done on 12/16, but doc decided to postpone. Pt has chronic SHOB and that intermittently becomes worse. Hx of stage 4 lung cancer.

## 2021-08-25 NOTE — Progress Notes (Signed)
Left lower extremity venous duplex has been completed. Preliminary results can be found in CV Proc through chart review.  Results were given to Dr. Sherry Ruffing.  08/25/21 10:22 AM Carlos Levering RVT

## 2021-08-25 NOTE — H&P (Signed)
History and Physical    Ethan Brown YIR:[id number] DOB: 07/20/1951 DOA: 08/25/2021  PCP: Ethan Morale, MD   Patient coming from: Home.  I have personally briefly reviewed patient's old medical records in [address]  Chief Complaint: Confusion and weakness.  HPI: Ethan Brown is a 71 y.o. male with medical history significant of borderline hypertension, stage IV lung cancer with brain and bone metastasis, left detached vitreous humor, history of left LE DVT, hyperlipidemia, migraine headache, pseudophakia, transient global amnesia who was brought to the emergency department after he was wife noticed that he was having confusion and weakness earlier in the morning.  His chronic dyspnea has also gotten worse.  He has been coughing more with pleuritic chest pain.  No fever, but positive chills, fatigue and decreased appetite.  No rhinorrhea, sore throat or hemoptysis.  No palpitations, dizziness, diaphoresis, PND, positive orthopnea.  No abdominal pain, nausea, vomiting, diarrhea, melena or hematochezia.  No flank pain, dysuria, frequency or hematuria.  ED Course: Initial vital signs were temperature 97.6 F, pulse 08/24/1930, respiration 18, BP 85/60 mmHg O2 sat 94% on room air.  The patient received supplemental oxygen and was started on Zosyn in the emergency department after consultation with pharmacy.  Lab work: CBC is her white count 12.3, hemoglobin 13.1 g/dL and platelets 279.  CMP showed normal electrolytes when calcium corrected to albumin.  Glucose 106, BUN 25 and creatinine 1.37 mg/dL.  Total protein 6.1 and albumin 2.9 g/dL.  Transaminases and bilirubin are normal.  Alkaline phosphatase is increased at 230 units/L.  Lactic acid 2.3 and then 2.1 mmol/L.  Troponin was 18 and then 23 ng/L.  Imaging: Doppler ultrasound was negative for DVT.  One-view portable chest radiograph show bilateral pleural effusion with new hazy areas of airspace opacity in the mid and lower  lungs.  CTA chest no PE.  Bronchus intermedius with worsening aeration of the right base likely pneumonia superimposed on lung tumor.  Please see images and report for further details.  Review of Systems: As per HPI otherwise all other systems reviewed and are negative.  Past Medical History:  Diagnosis Date   Borderline hypertension    Brain metastasis ([place]) dx'd 01/2019   Detached vitreous humor, left    DVT (deep venous thrombosis) (HCC)    left knee area   Dyspnea    ED (erectile dysfunction) of organic origin    Hyperlipidemia    Hypertension    Lens subluxation, left    Lung cancer ([place]) dx'd 01/2019   Stage IV   Migraine headache    takes PRN Imitrex   Pseudophakia    TGA (transient global amnesia) 2017    Past Surgical History:  Procedure Laterality Date   CAROTID DOPPLERS  09/2017   Mild non-obstructive disease   CATARACT EXTRACTION, BILATERAL     cataract, left  2018   CHEST TUBE INSERTION Right 03/29/2019   Procedure: INSERTION PLEURAL DRAINAGE CATHETER;  Surgeon: Ethan Nakayama, MD;  Location: [facility];  Service: Thoracic;  Laterality: Right;   COLONOSCOPY  2016   clear, repeat in 10 yrs    Eye surgeries     , Lens attachment.  Vitrectomy   FEMORAL HERNIA REPAIR     HERNIA REPAIR     IR THORACENTESIS ASP PLEURAL SPACE W/IMG GUIDE  02/22/2019   IR THORACENTESIS ASP PLEURAL SPACE W/IMG GUIDE  03/13/2019   IR THORACENTESIS ASP PLEURAL SPACE W/IMG GUIDE  03/18/2020   LUMBAR LAMINECTOMY  PLEURAL BIOPSY Right 03/29/2019   Procedure: PLEURAL BIOPSY;  Surgeon: Ethan Nakayama, MD;  Location: [facility];  Service: Thoracic;  Laterality: Right;   PORTACATH PLACEMENT N/A 03/29/2019   Procedure: INSERTION PORT-A-CATH;  Surgeon: Ethan Nakayama, MD;  Location: [facility];  Service: Thoracic;  Laterality: N/A;   retinal attachment, right     sclearl buckle, right     TRANSTHORACIC ECHOCARDIOGRAM  12/14/2019   EF 50 EF 55% (low normal).  Abnormal septal wall  motion related to BBB; indeterminate diastolic parameters.  Mild RV enlargement with mildly reduced function.  Normal valves.  Normal atrial sizes.   victrectomy,right     VIDEO ASSISTED THORACOSCOPY Right 03/29/2019   Procedure: VIDEO ASSISTED THORACOSCOPY;  Surgeon: Ethan Nakayama, MD;  Location: [facility];  Service: Thoracic;  Laterality: Right;    Social History  reports that he has never smoked. He has never used smokeless tobacco. He reports current alcohol use of about 1.0 standard drink per week. He reports that he does not use drugs.  No Known Allergies  Family History  Problem Relation Age of Onset   Alcohol abuse Mother    Diabetes Father    Hypertension Father    Prostate cancer Father    Cancer Father    Heart attack Maternal Grandfather 78   Prior to Admission medications   Medication Sig Start Date End Date Taking? Authorizing Provider  afatinib dimaleate (GILOTRIF) 30 MG tablet Take 1 tablet (30 mg total) by mouth daily. Take on an empty stomach 1hr before or 2hrs after meals. Patient taking differently: Take 30 mg by mouth daily before breakfast. 07/16/21  Yes Ethan Bears, MD  apixaban (ELIQUIS) 5 MG TABS tablet Take 1 tablet (5 mg total) by mouth 2 (two) times daily. 06/09/21  Yes Heilingoetter, Cassandra L, PA-C  cycloSPORINE (RESTASIS) 0.05 % ophthalmic emulsion Place 1 drop into both eyes in the morning.   Yes [provider]  diphenhydrAMINE (BENADRYL) 25 MG tablet Take 50 mg by mouth at bedtime as needed for sleep.   Yes [provider]  furosemide (LASIX) 20 MG tablet TAKE 2TABS IN THE MORNING AND 1 TAB IN THE EVENING Patient taking differently: Take 20-40 mg by mouth See admin instructions. TAKE 2TABS IN THE MORNING AND 1 TAB IN THE EVENING 08/12/21  Yes Ethan Bears, MD  HYDROcodone bit-homatropine (HYCODAN) 5-1.5 MG/5ML syrup Take 5 mLs by mouth every 6 (six) hours as needed for cough. 05/07/21  Yes Ethan Bears, MD  LORazepam  (ATIVAN) 1 MG tablet Take 1 mg by mouth at bedtime as needed for anxiety or sleep.   Yes [provider]  magic mouthwash SOLN Take 5 mLs by mouth 4 (four) times daily as needed for mouth pain. Swish and spit or swallow 01/14/21  Yes Heilingoetter, Cassandra L, PA-C  Multiple Vitamins-Minerals (MULTIVITAMIN WITH MINERALS) tablet Take 1 tablet by mouth daily.   Yes [provider]  potassium chloride (KLOR-CON) 10 MEQ tablet TAKE 1 TABLET BY MOUTH EVERY DAY Patient taking differently: 10 mEq daily. 08/12/21  Yes Heilingoetter, Cassandra L, PA-C  prochlorperazine (COMPAZINE) 10 MG tablet Take 1 tablet (10 mg total) by mouth every 6 (six) hours as needed for nausea or vomiting. 05/21/20  Yes Ethan Bears, MD  tamsulosin (FLOMAX) 0.4 MG CAPS capsule Take 0.4 mg by mouth at bedtime. 12/22/19  Yes [provider]  hydrochlorothiazide (MICROZIDE) 12.5 MG capsule TAKE 1 CAPSULE BY MOUTH EVERY DAY Patient not taking: Reported on 08/25/2021  01/06/21   Leonie Man, MD  temazepam (RESTORIL) 15 MG capsule Take 1 capsule (15 mg total) by mouth at bedtime as needed for sleep. Patient not taking: Reported on 08/25/2021 08/06/21   Ethan Bears, MD    Physical Exam: Vitals:   08/25/21 1300 08/25/21 1315 08/25/21 1400 08/25/21 1700  BP: (!) 102/57  (!) 147/88 115/77  Pulse:  (!) 102 94 93  Resp:  (!) 22 (!) 23 (!) 24  Temp:      TempSrc:      SpO2:  100% 97% 96%    Constitutional: Chronically ill-appearing.  NAD at the moment. Eyes: PERRL, lids and conjunctivae normal ENMT: Nasal cannula in place.  Mucous membranes are mildly dry.  Posterior pharynx clear of any exudate or lesions. Neck: normal, supple, no masses, no thyromegaly Respiratory: Mildly tachypneic, decreased breath sounds in both bases, mild bilateral rhonchi, no wheezing, no crackles. Normal respiratory effort. No accessory muscle use.  Cardiovascular: Tachycardic in the low 100s, no murmurs / rubs / gallops.   2+ bilateral lower extremity pitting edema. 2+ pedal pulses. No carotid bruits.  Abdomen: no tenderness, no masses palpated. No hepatosplenomegaly. Bowel sounds positive.  Musculoskeletal: Moderate generalized weakness.  No clubbing / cyanosis. Good ROM, no contractures. Normal muscle tone.  Skin: no acute rashes, lesions, ulcers on very limited dermatological examination. Neurologic: CN 2-12 grossly intact. Sensation intact, DTR normal. Strength 5/5 in all 4.  Psychiatric: Normal judgment and insight. Alert and oriented x 3. Normal mood.   Labs on Admission: I have personally reviewed following labs and imaging studies  CBC: Recent Labs  Lab 08/25/21 0943  WBC 12.3*  NEUTROABS 11.5*  HGB 13.1  HCT 40.3  MCV 96.9  PLT 557    Basic Metabolic Panel: Recent Labs  Lab 08/25/21 0943  NA 137  K 3.5  CL 106  CO2 26  GLUCOSE 106*  BUN 25*  CREATININE 1.37*  CALCIUM 8.4*  MG 1.8    GFR: CrCl cannot be calculated (Unknown ideal weight.).  Liver Function Tests: Recent Labs  Lab 08/25/21 0943  AST 27  ALT 33  ALKPHOS 230*  BILITOT 0.9  PROT 6.1*  ALBUMIN 2.9*   Radiological Exams on Admission: CT Angio Chest PE W and/or Wo Contrast  Result Date: 08/25/2021 CLINICAL DATA:  Pulmonary embolism. History of lung cancer. Hypoxia EXAM: CT ANGIOGRAPHY CHEST WITH CONTRAST TECHNIQUE: Multidetector CT imaging of the chest was performed using the standard protocol during bolus administration of intravenous contrast. Multiplanar CT image reconstructions and MIPs were obtained to evaluate the vascular anatomy. CONTRAST:  11mL OMNIPAQUE IOHEXOL 350 MG/ML SOLN COMPARISON:  06/23/2021 FINDINGS: Cardiovascular: Satisfactory opacification of the pulmonary arteries to the segmental level. No evidence of pulmonary embolism. Normal heart size. No pericardial effusion. Porta catheter in place. Mediastinum/Nodes: Obstructed bronchus intermedius with central low-density appearance attributed to  debris. Study is timed for arterial enhancement rather than parenchymal enhancement, cannot exclude superimposed airway tumor/infiltration. Patulous upper esophagus. No clear esophageal wall thickening Lungs/Pleura: Worsening aeration on the right where there is diffuse opacification, ground-glass density, and reticulonodular appearance best seen at the pleura and apical aerated lung. Large left pleural effusion. Moderate right pleural effusion loculated at the base with pleural thickening. Upper Abdomen: No acute finding Musculoskeletal: Extensive sclerotic metastatic disease involving ribs and spine. Review of the MIP images confirms the above findings. IMPRESSION: 1. Debris obstructs the bronchus intermedius with worsening aeration at the right base that is likely pneumonia superimposed on pre-existing  tumor related opacity. 2. Negative for pulmonary embolism. 3. Continued large left and moderate but complex right pleural effusion. Electronically Signed   By: Jorje Guild M.D.   On: 08/25/2021 11:19   DG Chest Portable 1 View  Result Date: 08/25/2021 CLINICAL DATA:  Short of breath.  Hypoxia.  Known lung carcinoma. EXAM: PORTABLE CHEST 1 VIEW COMPARISON:  08/05/2021 and older studies.  CT, 06/23/2021. FINDINGS: Hazy lung opacity lies in the left peripheral mid lung, increased from the prior exam. There is also hazy opacity at the left lung base that has increased. Small left effusion. Right pleural effusion, lung base opacity and interstitial thickening is stable. There is hazy airspace opacity in the right mid to upper lung that is new. Volume loss on the right is stable. No pneumothorax. Stable right anterior chest wall Port-A-Cath. IMPRESSION: 1. Interval worsening of lung aeration. 2. Hazy areas of airspace opacity have developed in the mid and lower lungs, on the right superimposed on the chronic changes related to lung carcinoma. Suspect multifocal infection or inflammation as the etiology. 3.  Bilateral pleural effusions without convincing change. Electronically Signed   By: Lajean Manes M.D.   On: 08/25/2021 10:37   VAS [place] LOWER EXTREMITY VENOUS (DVT) (ONLY [facility] & [facility])  Result Date: 08/25/2021  Lower Venous DVT Study Patient Name:  KORBAN SHEARER [facility]  Date of Exam:   08/25/2021 Medical Rec #: [id number]              Accession #:    [id number] Date of Birth: 1950-12-17               Patient Gender: M Patient Age:   29 years Exam Location:  [facility] Procedure:      VAS [place] LOWER EXTREMITY VENOUS (DVT) Referring Phys: Marda Stalker --------------------------------------------------------------------------------  Indications: Swelling.  Risk Factors: Cancer. Limitations: Poor ultrasound/tissue interface. Comparison Study: 03/28/2021 - RIGHT:                   - No evidence of common femoral vein obstruction.                    LEFT:                   - Findings consistent with acute deep vein thrombosis                   involving the left                   common femoral vein, SF junction, left femoral vein, left                   proximal                   profunda vein, left popliteal vein, left posterior tibial                   veins, left                   peroneal veins, and left gastrocnemius                   veins.                   - Findings consistent with acute superficial vein thrombosis  involving the                   left small saphenous vein.                   - No cystic structure found in the popliteal fossa.                    06/05/2021 - RIGHT:                   - No evidence of common femoral vein obstruction.                    LEFT:                   - Findings consistent with age indeterminate deep vein                   thrombosis                   involving the left femoral vein, left proximal profunda vein,                   left                   popliteal vein, left posterior tibial veins, and left peroneal                   veins. When                    compared to prior study, there is resolution                   of the DVT seen at the level of the CFV/SFJ, however the                   remaining                   segments of the left lower extremity demonstrate no                   significant change.                   - No cystic structure found in the popliteal fossa. Performing Technologist: Oliver Hum RVT  Examination Guidelines: A complete evaluation includes B-mode imaging, spectral Doppler, color Doppler, and power Doppler as needed of all accessible portions of each vessel. Bilateral testing is considered an integral part of a complete examination. Limited examinations for reoccurring indications may be performed as noted. The reflux portion of the exam is performed with the patient in reverse Trendelenburg.  +-----+---------------+---------+-----------+----------+--------------+  RIGHT Compressibility Phasicity Spontaneity Properties Thrombus Aging  +-----+---------------+---------+-----------+----------+--------------+  CFV   Full            Yes       Yes                                    +-----+---------------+---------+-----------+----------+--------------+   +---------+---------------+---------+-----------+----------+-------------------+  LEFT      Compressibility Phasicity Spontaneity Properties Thrombus Aging       +---------+---------------+---------+-----------+----------+-------------------+  CFV       Full            Yes       Yes                                         +---------+---------------+---------+-----------+----------+-------------------+  SFJ       Full            No        No                     Chronic              +---------+---------------+---------+-----------+----------+-------------------+  FV Prox   None            No        No                     Chronic              +---------+---------------+---------+-----------+----------+-------------------+  FV Mid    None            No        No                      Chronic              +---------+---------------+---------+-----------+----------+-------------------+  FV Distal None            No        No                     Chronic              +---------+---------------+---------+-----------+----------+-------------------+  PFV       Partial         Yes       Yes                    Chronic              +---------+---------------+---------+-----------+----------+-------------------+  POP       None            No        No                     Chronic              +---------+---------------+---------+-----------+----------+-------------------+  PTV       None                                             Chronic              +---------+---------------+---------+-----------+----------+-------------------+  PERO                                                       Not well visualized  +---------+---------------+---------+-----------+----------+-------------------+  Gastroc   None                                             Chronic              +---------+---------------+---------+-----------+----------+-------------------+  SSV       None  Chronic              +---------+---------------+---------+-----------+----------+-------------------+     Summary: RIGHT: - No evidence of common femoral vein obstruction.  LEFT: - Findings consistent with chronic deep vein thrombosis involving the left femoral vein, left proximal profunda vein, left popliteal vein, left posterior tibial veins, and left gastrocnemius veins. - Findings consistent with chronic superficial vein thrombosis involving the left small saphenous vein. - No cystic structure found in the popliteal fossa.  *See table(s) above for measurements and observations. Electronically signed by Monica Martinez MD on 08/25/2021 at 12:20:31 PM.    Final     EKG: Independently reviewed.  PR interval 90 ms QRS duration 115 ms QT/QTcB 307/459 ms P-R-T axes -56 -71 121 Sinus or ectopic  atrial tachycardia Ventricular premature complex Left anterior fascicular block Low voltage, precordial leads Consider anterior infarct  Assessment/Plan Principal Problem:   Postobstructive pneumonia Admit to PCU/inpatient. Continue supplemental oxygen. Bronchodilators as needed. Continue Zosyn per pharmacy. Antitussive as needed. Analgesics as needed.  Active Problems:   Pleural effusion Discussed briefly with CTS on-call. They will evaluate him tomorrow.    Adenocarcinoma of right lung, stage 4 (HCC) Follow-up with Dr. Earlie Server as scheduled. Follow-up with radiation oncology as scheduled. Given advanced disease long-term prognosis is poor.    Deep venous thrombosis of distal     vein of left lower extremity (HCC) On apixaban.    Elevated troponin Likely demand ischemia. No typical chest pain. No significant uptrend.    DVT prophylaxis: On apixaban. Code Status:   Full code.  Family Communication:  His spouse [name] was in the ED room.  Disposition Plan:   Patient is from:  Home.  Anticipated DC to:  Home.  Anticipated DC date:  08/26/2021.  Anticipated DC barriers: Clinical condition.  Consults called:  CTS Dr. Roxan Hockey. Admission status:  Inpatient/PCU.  Severity of Illness: High severity in the setting of postobstructive pneumonia with bilateral pleural effusions due to stage IV lung cancer.  Reubin Milan MD [facility]  How to contact the [facility] Attending or Consulting provider [place] or covering provider during after hours [place], for this patient?   Check the care team in [facility] and look for a) attending/consulting [facility] provider listed and b) the [facility] team listed Log into [email or web address] and use [place]'s universal password to access. If you do not have the password, please contact the hospital operator. Locate the [facility] provider you are looking for under [facility] and page to a number that you can be directly reached. If you still  have difficulty reaching the provider, please page the [facility] (Director on Call) for the Hospitalists listed on amion for assistance.  08/25/2021, 6:48 PM   This document was prepared using Dragon voice recognition software and may contain some unintended transcription errors.

## 2021-08-25 NOTE — Progress Notes (Signed)
Pharmacy Antibiotic Note  Ethan Brown is a 71 y.o. male admitted on 08/25/2021 with postobstructive pneumonia.  Pharmacy has been consulted for Zosyn dosing.  08/25/2021 1314 - Zosyn 3.375 g IV x 1 administered in ED  Plan: Zosyn 3.375g IV q8h (4 hour infusion). Monitor clinical progress, renal function F/U C&S, abx deescalation / LOT      Temp (24hrs), Avg:97.6 F (36.4 C), Min:97.6 F (36.4 C), Max:97.6 F (36.4 C)  Recent Labs  Lab 08/25/21 0943 08/25/21 0947 08/25/21 1215  WBC 12.3*  --   --   CREATININE 1.37*  --   --   LATICACIDVEN  --  2.3* 2.1*    CrCl cannot be calculated (Unknown ideal weight.).    No Known Allergies  Antimicrobials this admission: 1/2 Zosyn >>    Dose adjustments this admission:   Microbiology results: 1/2 BCx: sent  Thank you for allowing pharmacy to be a part of this patients care.  Royetta Asal, PharmD, BCPS Clinical Pharmacist [place] Please utilize Amion for appropriate phone number to reach the unit pharmacist ([place]) 08/25/2021 3:39 PM

## 2021-08-26 ENCOUNTER — Ambulatory Visit: Payer: Medicare Other | Admitting: Thoracic Surgery (Cardiothoracic Vascular Surgery)

## 2021-08-26 ENCOUNTER — Ambulatory Visit: Payer: Medicare Other

## 2021-08-26 ENCOUNTER — Ambulatory Visit: Payer: Medicare Other | Admitting: Student

## 2021-08-26 ENCOUNTER — Other Ambulatory Visit (HOSPITAL_COMMUNITY): Payer: Self-pay

## 2021-08-26 DIAGNOSIS — R4182 Altered mental status, unspecified: Secondary | ICD-10-CM | POA: Diagnosis not present

## 2021-08-26 DIAGNOSIS — J910 Malignant pleural effusion: Secondary | ICD-10-CM | POA: Diagnosis not present

## 2021-08-26 LAB — CBC
HCT: 36.5 % — ABNORMAL LOW (ref 39.0–52.0)
Hemoglobin: 11.8 g/dL — ABNORMAL LOW (ref 13.0–17.0)
MCH: 31.3 pg (ref 26.0–34.0)
MCHC: 32.3 g/dL (ref 30.0–36.0)
MCV: 96.8 fL (ref 80.0–100.0)
Platelets: 273 10*3/uL (ref 150–400)
RBC: 3.77 MIL/uL — ABNORMAL LOW (ref 4.22–5.81)
RDW: 15 % (ref 11.5–15.5)
WBC: 7.2 10*3/uL (ref 4.0–10.5)
nRBC: 0 % (ref 0.0–0.2)

## 2021-08-26 LAB — MAGNESIUM: Magnesium: 2 mg/dL (ref 1.7–2.4)

## 2021-08-26 LAB — COMPREHENSIVE METABOLIC PANEL
ALT: 26 U/L (ref 0–44)
AST: 19 U/L (ref 15–41)
Albumin: 2.5 g/dL — ABNORMAL LOW (ref 3.5–5.0)
Alkaline Phosphatase: 185 U/L — ABNORMAL HIGH (ref 38–126)
Anion gap: 13 (ref 5–15)
BUN: 29 mg/dL — ABNORMAL HIGH (ref 8–23)
CO2: 28 mmol/L (ref 22–32)
Calcium: 8.4 mg/dL — ABNORMAL LOW (ref 8.9–10.3)
Chloride: 103 mmol/L (ref 98–111)
Creatinine, Ser: 1.19 mg/dL (ref 0.61–1.24)
GFR, Estimated: >60 mL/min (ref >60)
Glucose, Bld: 113 mg/dL — ABNORMAL HIGH (ref 70–99)
Potassium: 3.2 mmol/L — ABNORMAL LOW (ref 3.5–5.1)
Sodium: 144 mmol/L (ref 135–145)
Total Bilirubin: 0.8 mg/dL (ref 0.3–1.2)
Total Protein: 5.7 g/dL — ABNORMAL LOW (ref 6.5–8.1)

## 2021-08-26 LAB — MRSA NEXT GEN BY PCR, NASAL: MRSA by PCR Next Gen: NOT DETECTED

## 2021-08-26 LAB — PHOSPHORUS: Phosphorus: 3.5 mg/dL (ref 2.5–4.6)

## 2021-08-26 LAB — STREP PNEUMONIAE URINARY ANTIGEN: Strep Pneumo Urinary Antigen: NEGATIVE

## 2021-08-26 LAB — TROPONIN I (HIGH SENSITIVITY): Troponin I (High Sensitivity): 10 ng/L (ref <18)

## 2021-08-26 MED ORDER — SODIUM CHLORIDE 0.9% FLUSH
10.0000 mL | INTRAVENOUS | Status: DC | PRN
  Administered 2021-08-30: 10 mL

## 2021-08-26 MED ORDER — POTASSIUM CHLORIDE 20 MEQ PO PACK
40.0000 meq | PACK | Freq: Once | ORAL | Status: AC
  Administered 2021-08-26: 40 meq via ORAL
  Filled 2021-08-26: qty 2

## 2021-08-26 MED ORDER — SODIUM CHLORIDE 0.9% FLUSH
10.0000 mL | Freq: Two times a day (BID) | INTRAVENOUS | Status: DC
  Administered 2021-08-26 – 2021-08-29 (×7): 10 mL
  Administered 2021-08-30: 20 mL

## 2021-08-26 MED ORDER — CHLORHEXIDINE GLUCONATE CLOTH 2 % EX PADS
6.0000 | MEDICATED_PAD | Freq: Every day | CUTANEOUS | Status: DC
  Administered 2021-08-26 – 2021-08-30 (×5): 6 via TOPICAL

## 2021-08-26 NOTE — Plan of Care (Signed)

## 2021-08-26 NOTE — ED Notes (Signed)
Pt resting with eyes closed. Wife at bedside Call bell in reach.

## 2021-08-26 NOTE — Consult Note (Signed)
Reason for Consult:malignant pleural effusions Referring Physician: [facility]ad  Ethan Brown is an 71 y.o. male.  HPI: 71 yo man who I know well presents with altered mental status and general malaise.   Ethan Brown has stage IV lung cancer with bone and brain metastases and malignant pleural effusions.  He had a pleural catheter in previously. This fall he developed recurrent left pleural effusions. He has had a stable right effusion with volume loss for a long time.  He had 3 thoracenteses in a relatively short period of time.    He was scheduled for a left pleural catheter a couple of weeks ago, but his CXR on the day of the procedure showed insufficient left effusion for catheter placement and the procedure was cancelled.  Now presents with worsening cough, pleuritic CP, fatigue , loss of appetite, chills and general malaise. On the day of admission his wife noted confusion and lethargy.  In ED WBC noted to be elevated, started on empiric Zosyn. CT chest showed volume loss with obstruction of bronchus intermedius, postobstructive pneumonia, and bilateral effusions.    Past Medical History:  Diagnosis Date   Borderline hypertension    Brain metastasis ([place]) dx'd 01/2019   Detached vitreous humor, left    DVT (deep venous thrombosis) (HCC)    left knee area   Dyspnea    ED (erectile dysfunction) of organic origin    Hyperlipidemia    Hypertension    Lens subluxation, left    Lung cancer ([place]) dx'd 01/2019   Stage IV   Migraine headache    takes PRN Imitrex   Pseudophakia    TGA (transient global amnesia) 2017    Past Surgical History:  Procedure Laterality Date   CAROTID DOPPLERS  09/2017   Mild non-obstructive disease   CATARACT EXTRACTION, BILATERAL     cataract, left  2018   CHEST TUBE INSERTION Right 03/29/2019   Procedure: INSERTION PLEURAL DRAINAGE CATHETER;  Surgeon: [name], MD;  Location: [facility];  Service: Thoracic;  Laterality: Right;    COLONOSCOPY  2016   clear, repeat in 10 yrs    Eye surgeries     , Lens attachment.  Vitrectomy   FEMORAL HERNIA REPAIR     HERNIA REPAIR     IR THORACENTESIS ASP PLEURAL SPACE W/IMG GUIDE  02/22/2019   IR THORACENTESIS ASP PLEURAL SPACE W/IMG GUIDE  03/13/2019   IR THORACENTESIS ASP PLEURAL SPACE W/IMG GUIDE  03/18/2020   LUMBAR LAMINECTOMY     PLEURAL BIOPSY Right 03/29/2019   Procedure: PLEURAL BIOPSY;  Surgeon: [name], MD;  Location: [facility];  Service: Thoracic;  Laterality: Right;   PORTACATH PLACEMENT N/A 03/29/2019   Procedure: INSERTION PORT-A-CATH;  Surgeon: [name], MD;  Location: [facility];  Service: Thoracic;  Laterality: N/A;   retinal attachment, right     sclearl buckle, right     TRANSTHORACIC ECHOCARDIOGRAM  12/14/2019   EF 50 EF 55% (low normal).  Abnormal septal wall motion related to BBB; indeterminate diastolic parameters.  Mild RV enlargement with mildly reduced function.  Normal valves.  Normal atrial sizes.   victrectomy,right     VIDEO ASSISTED THORACOSCOPY Right 03/29/2019   Procedure: VIDEO ASSISTED THORACOSCOPY;  Surgeon: [name], MD;  Location[facility];  Service: Thoracic;  Laterality: Right;    Family History  Problem Relation Age of Onset   Alcohol abuse Mother    Diabetes Father    Hypertension Father  Prostate cancer Father    Cancer Father    Heart attack Maternal Grandfather 51    Social History:  reports that he has never smoked. He has never used smokeless tobacco. He reports current alcohol use of about 1.0 standard drink per week. He reports that he does not use drugs.  Allergies: No Known Allergies  Medications: Scheduled:  apixaban  5 mg Oral BID   Chlorhexidine Gluconate Cloth  6 each Topical Daily   cycloSPORINE  1 drop Both Eyes Daily   sodium chloride flush  10-40 mL Intracatheter Q12H   tamsulosin  0.4 mg Oral QHS    Results for orders placed or performed during the hospital encounter of  08/25/21 (from the past 48 hour(s))  CBC with Differential     Status: Abnormal   Collection Time: 08/25/21  9:43 AM  Result Value Ref Range   WBC 12.3 (H) 4.0 - 10.5 K/uL   RBC 4.16 (L) 4.22 - 5.81 MIL/uL   Hemoglobin 13.1 13.0 - 17.0 g/dL   HCT 40.3 39.0 - 52.0 %   MCV 96.9 80.0 - 100.0 fL   MCH 31.5 26.0 - 34.0 pg   MCHC 32.5 30.0 - 36.0 g/dL   RDW 14.9 11.5 - 15.5 %   Platelets 279 150 - 400 K/uL   nRBC 0.0 0.0 - 0.2 %   Neutrophils Relative % 94 %   Neutro Abs 11.5 (H) 1.7 - 7.7 K/uL   Lymphocytes Relative 2 %   Lymphs Abs 0.2 (L) 0.7 - 4.0 K/uL   Monocytes Relative 4 %   Monocytes Absolute 0.5 0.1 - 1.0 K/uL   Eosinophils Relative 0 %   Eosinophils Absolute 0.0 0.0 - 0.5 K/uL   Basophils Relative 0 %   Basophils Absolute 0.0 0.0 - 0.1 K/uL   Immature Granulocytes 0 %   Abs Immature Granulocytes 0.04 0.00 - 0.07 K/uL    Comment: Performed at [facility], [place] [address], [place], [place] [address]  Comprehensive metabolic panel     Status: Abnormal   Collection Time: 08/25/21  9:43 AM  Result Value Ref Range   Sodium 137 135 - 145 mmol/L   Potassium 3.5 3.5 - 5.1 mmol/L   Chloride 106 98 - 111 mmol/L   CO2 26 22 - 32 mmol/L   Glucose, Bld 106 (H) 70 - 99 mg/dL    Comment: Glucose reference range applies only to samples taken after fasting for at least 8 hours.   BUN 25 (H) 8 - 23 mg/dL   Creatinine, Ser 1.37 (H) 0.61 - 1.24 mg/dL   Calcium 8.4 (L) 8.9 - 10.3 mg/dL   Total Protein 6.1 (L) 6.5 - 8.1 g/dL   Albumin 2.9 (L) 3.5 - 5.0 g/dL   AST 27 15 - 41 U/L   ALT 33 0 - 44 U/L   Alkaline Phosphatase 230 (H) 38 - 126 U/L   Total Bilirubin 0.9 0.3 - 1.2 mg/dL   GFR, Estimated 55 (L) >60 mL/min    Comment: (NOTE) Calculated using the CKD-EPI Creatinine Equation (2021)    Anion gap 5 5 - 15    Comment: Performed at [facility], [place] [address], [place], [place] [address]  Troponin I (High Sensitivity)     Status: Abnormal    Collection Time: 08/25/21  9:43 AM  Result Value Ref Range   Troponin I (High Sensitivity) 18 (H) <18 ng/L    Comment: (NOTE) Elevated high sensitivity troponin I (  hsTnI) values and significant  changes across serial measurements may suggest ACS but many other  chronic and acute conditions are known to elevate hsTnI results.  Refer to the "Links" section for chest pain algorithms and additional  guidance. Performed at [facility], [place] [address] [place], [place] [address]   Magnesium     Status: None   Collection Time: 08/25/21  9:43 AM  Result Value Ref Range   Magnesium 1.8 1.7 - 2.4 mg/dL    Comment: Performed at [facility], [place] [address] [place], [place] [address]  Lactic acid, plasma     Status: Abnormal   Collection Time: 08/25/21  9:47 AM  Result Value Ref Range   Lactic Acid, Venous 2.3 (HH) 0.5 - 1.9 mmol/L    Comment: CRITICAL RESULT CALLED TO, READ BACK BY AND VERIFIED WITH: BRATU,D. EMTP AT 1054 08/25/21 MULLINS,T Performed at [facility], [place] [address] [place], [place] [address]   Brain natriuretic peptide     Status: None   Collection Time: 08/25/21  9:48 AM  Result Value Ref Range   B Natriuretic Peptide 15.2 0.0 - 100.0 pg/mL    Comment: Performed at [facility], [place] [address] [place], [place] [address]  Resp Panel by RT-PCR (Flu A&B, Covid) Nasopharyngeal Swab     Status: None   Collection Time: 08/25/21  9:50 AM   Specimen: Nasopharyngeal Swab; Nasopharyngeal(NP) swabs in vial transport medium  Result Value Ref Range   SARS Coronavirus 2 by RT PCR NEGATIVE NEGATIVE    Comment: (NOTE) SARS-CoV-2 target nucleic acids are NOT DETECTED.  The SARS-CoV-2 RNA is generally detectable in upper respiratory specimens during the acute phase of infection. The lowest concentration of SARS-CoV-2 viral copies this assay can detect is 138 copies/mL. A negative result does not preclude  SARS-Cov-2 infection and should not be used as the sole basis for treatment or other patient management decisions. A negative result may occur with  improper specimen collection/handling, submission of specimen other than nasopharyngeal swab, presence of viral mutation(s) within the areas targeted by this assay, and inadequate number of viral copies(<138 copies/mL). A negative result must be combined with clinical observations, patient history, and epidemiological information. The expected result is Negative.  Fact Sheet for Patients:  EntrepreneurPulse.com.au  Fact Sheet for Healthcare Providers:  IncredibleEmployment.be  This test is no t yet approved or cleared by the [place] FDA and  has been authorized for detection and/or diagnosis of SARS-CoV-2 by FDA under an Emergency Use Authorization (EUA). This EUA will remain  in effect (meaning this test can be used) for the duration of the COVID-19 declaration under Section 564(b)(1) of the Act, 21 U.S.C.section 360bbb-3(b)(1), unless the authorization is terminated  or revoked sooner.       Influenza A by PCR NEGATIVE NEGATIVE   Influenza B by PCR NEGATIVE NEGATIVE    Comment: (NOTE) The Xpert Xpress SARS-CoV-2/FLU/RSV plus assay is intended as an aid in the diagnosis of influenza from Nasopharyngeal swab specimens and should not be used as a sole basis for treatment. Nasal washings and aspirates are unacceptable for Xpert Xpress SARS-CoV-2/FLU/RSV testing.  Fact Sheet for Patients: EntrepreneurPulse.com.au  Fact Sheet for Healthcare Providers: IncredibleEmployment.be  This test is not yet approved or cleared by the [place] FDA and has been authorized for detection and/or diagnosis of SARS-CoV-2 by FDA under an Emergency Use Authorization (EUA). This EUA will remain in effect (meaning this test can be  used) for the duration of the COVID-19  declaration under Section 564(b)(1) of the Act, 21 U.S.C. section 360bbb-3(b)(1), unless the authorization is terminated or revoked.  Performed at [facility], [place] [address], [place], [place] [address]   Troponin I (High Sensitivity)     Status: Abnormal   Collection Time: 08/25/21 12:14 PM  Result Value Ref Range   Troponin I (High Sensitivity) 23 (H) <18 ng/L    Comment: (NOTE) Elevated high sensitivity troponin I (hsTnI) values and significant  changes across serial measurements may suggest ACS but many other  chronic and acute conditions are known to elevate hsTnI results.  Refer to the "Links" section for chest pain algorithms and additional  guidance. Performed at [facility], [place] [address], [place], [place] [address]   Lactic acid, plasma     Status: Abnormal   Collection Time: 08/25/21 12:15 PM  Result Value Ref Range   Lactic Acid, Venous 2.1 (HH) 0.5 - 1.9 mmol/L    Comment: CRITICAL VALUE NOTED.  VALUE IS CONSISTENT WITH PREVIOUSLY REPORTED AND CALLED VALUE. Performed at [facility], [place] [address], [place], [place] [address]   Blood culture (routine x 2)     Status: None (Preliminary result)   Collection Time: 08/25/21 12:55 PM   Specimen: BLOOD  Result Value Ref Range   Specimen Description      BLOOD BLOOD RIGHT HAND Performed at [place] [address], [place], [place] [address]    Special Requests      BOTTLES DRAWN AEROBIC AND ANAEROBIC Blood Culture results may not be optimal due to an inadequate volume of blood received in culture bottles Performed at [facility], [place] [address], [place], [place] [address]    Culture      NO GROWTH < 24 HOURS Performed at [place] [address], [place], [place] [address]    Report Status PENDING   Blood culture (routine x 2)     Status: None (Preliminary result)   Collection Time: 08/25/21  1:03 PM    Specimen: BLOOD  Result Value Ref Range   Specimen Description      BLOOD BLOOD RIGHT FOREARM Performed at [place] [address] [place], [place] [address]    Special Requests      BOTTLES DRAWN AEROBIC AND ANAEROBIC Blood Culture adequate volume Performed at [place] [address] [place], [place] [address]    Culture      NO GROWTH < 24 HOURS Performed at [place] [address], [place], [place] [address]    Report Status PENDING   Strep pneumoniae urinary antigen     Status: None   Collection Time: 08/26/21  3:41 AM  Result Value Ref Range   Strep Pneumo Urinary Antigen NEGATIVE NEGATIVE    Comment:        Infection due to S. pneumoniae cannot be absolutely ruled out since the antigen present may be below the detection limit of the test. Performed at [facility], [address], [place],  [address]   CBC     Status: Abnormal   Collection Time: 08/26/21  3:41 AM  Result Value Ref Range   WBC 7.2 4.0 - 10.5 K/uL   RBC 3.77 (L) 4.22 - 5.81 MIL/uL   Hemoglobin 11.8 (L) 13.0 - 17.0 g/dL   HCT 36.5 (L) 39.0 - 52.0 %  MCV 96.8 80.0 - 100.0 fL   MCH 31.3 26.0 - 34.0 pg   MCHC 32.3 30.0 - 36.0 g/dL   RDW 15.0 11.5 - 15.5 %   Platelets 273 150 - 400 K/uL   nRBC 0.0 0.0 - 0.2 %    Comment: Performed at [facility], [place] [address] [place], [place] [address]  Comprehensive metabolic panel     Status: Abnormal   Collection Time: 08/26/21  3:41 AM  Result Value Ref Range   Sodium 144 135 - 145 mmol/L    Comment: DELTA CHECK NOTED   Potassium 3.2 (L) 3.5 - 5.1 mmol/L   Chloride 103 98 - 111 mmol/L   CO2 28 22 - 32 mmol/L   Glucose, Bld 113 (H) 70 - 99 mg/dL    Comment: Glucose reference range applies only to samples taken after fasting for at least 8 hours.   BUN 29 (H) 8 - 23 mg/dL   Creatinine, Ser 1.19 0.61 - 1.24 mg/dL   Calcium 8.4 (L) 8.9 - 10.3 mg/dL   Total Protein 5.7  (L) 6.5 - 8.1 g/dL   Albumin 2.5 (L) 3.5 - 5.0 g/dL   AST 19 15 - 41 U/L   ALT 26 0 - 44 U/L   Alkaline Phosphatase 185 (H) 38 - 126 U/L   Total Bilirubin 0.8 0.3 - 1.2 mg/dL   GFR, Estimated >60 >60 mL/min    Comment: (NOTE) Calculated using the CKD-EPI Creatinine Equation (2021)    Anion gap 13 5 - 15    Comment: Performed at [facility], [place] [address], [place], [place] [address]  Magnesium     Status: None   Collection Time: 08/26/21  3:41 AM  Result Value Ref Range   Magnesium 2.0 1.7 - 2.4 mg/dL    Comment: Performed at [facility], [place] [address] [place], [place] [address]  Phosphorus     Status: None   Collection Time: 08/26/21  3:41 AM  Result Value Ref Range   Phosphorus 3.5 2.5 - 4.6 mg/dL    Comment: Performed at [facility], [place] [address], [place], [place] [address]  Troponin I (High Sensitivity)     Status: None   Collection Time: 08/26/21  2:20 PM  Result Value Ref Range   Troponin I (High Sensitivity) 10 <18 ng/L    Comment: (NOTE) Elevated high sensitivity troponin I (hsTnI) values and significant  changes across serial measurements may suggest ACS but many other  chronic and acute conditions are known to elevate hsTnI results.  Refer to the "Links" section for chest pain algorithms and additional  guidance. Performed at [facility], [place] [address], [place], [place] [address]     CT Angio Chest PE W and/or Wo Contrast  Result Date: 08/25/2021 CLINICAL DATA:  Pulmonary embolism. History of lung cancer. Hypoxia EXAM: CT ANGIOGRAPHY CHEST WITH CONTRAST TECHNIQUE: Multidetector CT imaging of the chest was performed using the standard protocol during bolus administration of intravenous contrast. Multiplanar CT image reconstructions and MIPs were obtained to evaluate the vascular anatomy. CONTRAST:  58mL OMNIPAQUE IOHEXOL 350 MG/ML SOLN COMPARISON:  06/23/2021 FINDINGS: Cardiovascular:  Satisfactory opacification of the pulmonary arteries to the segmental level. No evidence of pulmonary embolism. Normal heart size. No pericardial effusion. Porta catheter in place. Mediastinum/Nodes: Obstructed bronchus intermedius with central low-density appearance attributed to debris. Study is timed for arterial enhancement rather than parenchymal enhancement, cannot exclude superimposed airway tumor/infiltration. Patulous  upper esophagus. No clear esophageal wall thickening Lungs/Pleura: Worsening aeration on the right where there is diffuse opacification, ground-glass density, and reticulonodular appearance best seen at the pleura and apical aerated lung. Large left pleural effusion. Moderate right pleural effusion loculated at the base with pleural thickening. Upper Abdomen: No acute finding Musculoskeletal: Extensive sclerotic metastatic disease involving ribs and spine. Review of the MIP images confirms the above findings. IMPRESSION: 1. Debris obstructs the bronchus intermedius with worsening aeration at the right base that is likely pneumonia superimposed on pre-existing tumor related opacity. 2. Negative for pulmonary embolism. 3. Continued large left and moderate but complex right pleural effusion. Electronically Signed   By: Jorje Guild M.D.   On: 08/25/2021 11:19   DG Chest Portable 1 View  Result Date: 08/25/2021 CLINICAL DATA:  Short of breath.  Hypoxia.  Known lung carcinoma. EXAM: PORTABLE CHEST 1 VIEW COMPARISON:  08/05/2021 and older studies.  CT, 06/23/2021. FINDINGS: Hazy lung opacity lies in the left peripheral mid lung, increased from the prior exam. There is also hazy opacity at the left lung base that has increased. Small left effusion. Right pleural effusion, lung base opacity and interstitial thickening is stable. There is hazy airspace opacity in the right mid to upper lung that is new. Volume loss on the right is stable. No pneumothorax. Stable right anterior chest wall  Port-A-Cath. IMPRESSION: 1. Interval worsening of lung aeration. 2. Hazy areas of airspace opacity have developed in the mid and lower lungs, on the right superimposed on the chronic changes related to lung carcinoma. Suspect multifocal infection or inflammation as the etiology. 3. Bilateral pleural effusions without convincing change. Electronically Signed   By: Lajean Manes M.D.   On: 08/25/2021 10:37   VAS [place] LOWER EXTREMITY VENOUS (DVT) (ONLY [facility] & [facility])  Result Date: 08/25/2021  Lower Venous DVT Study Patient Name:  Ethan Brown [facility]  Date of Exam:   08/25/2021 Medical Rec #: [id number]              Accession #:    [id number] Date of Birth: 1950/09/15               Patient Gender: M Patient Age:   74 years Exam Location:  [facility] Procedure:      VAS [place] LOWER EXTREMITY VENOUS (DVT) Referring Phys: Marda Stalker --------------------------------------------------------------------------------  Indications: Swelling.  Risk Factors: Cancer. Limitations: Poor ultrasound/tissue interface. Comparison Study: 03/28/2021 - RIGHT:                   - No evidence of common femoral vein obstruction.                    LEFT:                   - Findings consistent with acute deep vein thrombosis                   involving the left                   common femoral vein, SF junction, left femoral vein, left                   proximal                   profunda vein, left popliteal vein, left posterior tibial  veins, left                   peroneal veins, and left gastrocnemius                   veins.                   - Findings consistent with acute superficial vein thrombosis                   involving the                   left small saphenous vein.                   - No cystic structure found in the popliteal fossa.                    06/05/2021 - RIGHT:                   - No evidence of common femoral vein obstruction.                    LEFT:                   - Findings consistent  with age indeterminate deep vein                   thrombosis                   involving the left femoral vein, left proximal profunda vein,                   left                   popliteal vein, left posterior tibial veins, and left peroneal                   veins. When                   compared to prior study, there is resolution                   of the DVT seen at the level of the CFV/SFJ, however the                   remaining                   segments of the left lower extremity demonstrate no                   significant change.                   - No cystic structure found in the popliteal fossa. Performing Technologist: Oliver Hum RVT  Examination Guidelines: A complete evaluation includes B-mode imaging, spectral Doppler, color Doppler, and power Doppler as needed of all accessible portions of each vessel. Bilateral testing is considered an integral part of a complete examination. Limited examinations for reoccurring indications may be performed as noted. The reflux portion of the exam is performed with the patient in reverse Trendelenburg.  +-----+---------------+---------+-----------+----------+--------------+  RIGHT Compressibility Phasicity Spontaneity Properties Thrombus Aging  +-----+---------------+---------+-----------+----------+--------------+  CFV   Full            Yes       Yes                                    +-----+---------------+---------+-----------+----------+--------------+   +---------+---------------+---------+-----------+----------+-------------------+  LEFT      Compressibility Phasicity Spontaneity Properties Thrombus Aging       +---------+---------------+---------+-----------+----------+-------------------+  CFV       Full            Yes       Yes                                         +---------+---------------+---------+-----------+----------+-------------------+  SFJ       Full            No        No                     Chronic               +---------+---------------+---------+-----------+----------+-------------------+  FV Prox   None            No        No                     Chronic              +---------+---------------+---------+-----------+----------+-------------------+  FV Mid    None            No        No                     Chronic              +---------+---------------+---------+-----------+----------+-------------------+  FV Distal None            No        No                     Chronic              +---------+---------------+---------+-----------+----------+-------------------+  PFV       Partial         Yes       Yes                    Chronic              +---------+---------------+---------+-----------+----------+-------------------+  POP       None            No        No                     Chronic              +---------+---------------+---------+-----------+----------+-------------------+  PTV       None                                             Chronic              +---------+---------------+---------+-----------+----------+-------------------+  PERO                                                       Not well visualized  +---------+---------------+---------+-----------+----------+-------------------+  Gastroc   None  Chronic              +---------+---------------+---------+-----------+----------+-------------------+  SSV       None                                             Chronic              +---------+---------------+---------+-----------+----------+-------------------+     Summary: RIGHT: - No evidence of common femoral vein obstruction.  LEFT: - Findings consistent with chronic deep vein thrombosis involving the left femoral vein, left proximal profunda vein, left popliteal vein, left posterior tibial veins, and left gastrocnemius veins. - Findings consistent with chronic superficial vein thrombosis involving the left small saphenous vein. - No cystic structure found in  the popliteal fossa.  *See table(s) above for measurements and observations. Electronically signed by Monica Martinez MD on 08/25/2021 at 12:20:31 PM.    Final     Review of Systems  See HPI  Blood pressure 123/75, pulse 86, temperature 97.7 F (36.5 C), temperature source Oral, resp. rate 20, SpO2 96 %. Physical Exam Vitals reviewed.  Constitutional:      Appearance: He is ill-appearing.  HENT:     Head: Normocephalic and atraumatic.  Cardiovascular:     Rate and Rhythm: Normal rate and regular rhythm.  Pulmonary:     Comments: Absent BS both bases Neurological:     General: No focal deficit present.     Mental Status: He is alert and oriented to person, place, and time.   I personally reviewed the CT images  Assessment/Plan: Ethan Brown is a 71 yo man with stage IV lung cancer with brain mets, bone mets, and malignant effusions.  He presents with cough, pleuritic CP, and general malaise. Found to have postobstructive RLL pneumonia.  He has a moderate left effusion that is more apparent on Ct than on plain film.  There is not adequate fluid for placement of a pleural catheter surgically. Would not want to do it under these circumstances anyway.  His right effusion is chronic and he has chronic volume loss on that side. I do not think draining it will provide any symptomatic relief.  It may need to be sampled at some point if he fails to respond to antibiotics.  He has near complete obstruction of the bronchus intermedius.  This is likely due to tumor progression and bodes poorly for his prognosis moving forward.  [name] 08/26/2021, 5:37 PM

## 2021-08-26 NOTE — Progress Notes (Signed)
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  Patient also been coughing with pleuritic chest pain.  Patient was found to be hypotensive in ED requiring IV hydration.  Work-up in the ED venous duplex negative for DVT, chest x-ray shows bilateral pleural effusion with new areas of airspace opacity, CTA chest ruled out PE but shows bronchus intermedius with worsening aeration of the right base likely pneumonia superimposed on lung tumor.  Patient is admitted for possible postobstructive pneumonia and started on empiric antibiotics.   Assessment & Plan:   Principal Problem:   Postobstructive pneumonia Active Problems:   Pleural effusion   Adenocarcinoma of right lung, stage 4 (HCC)   Deep venous thrombosis of distal vein of left lower extremity (HCC)   Elevated troponin  Post obstructive pneumonia: Patient with history of stage IV lung cancer presented with cough, SOB, worsening confusion. Continue supplemental oxygen to keep saturation above 94%. Continue bronchodilators as needed. Continue Zosyn for possible aspiration pneumonia. Continue antitussive as needed.  Pleural effusion: Case discussed with CTS on-call. Formal consult pending.  Adenocarcinoma of right lung stage IV: Patient follows up with Dr. Julien Nordmann as scheduled. Follow-up radiation oncology as scheduled Given advanced disease,  long-term prognosis is poor  Deep venous thrombosis of left lower extremity: Continue Eliquis  Elevated troponin Likely due to demand ischemia.   Patient denies any chest  pain,  no significant uptrend.  DVT prophylaxis: Eliquis Code Status: Full code. Family Communication: Wife at bedside Disposition Plan:   Status is: Inpatient  Remains inpatient appropriate because:  Admitted for postobstructive pneumonia requiring IV antibiotics.   Consultants:   None  Procedures: CTA chest Antimicrobials:   Anti-infectives (From admission, onward)    Start     Dose/Rate Route Frequency Ordered Stop   08/25/21 2000  piperacillin-tazobactam (ZOSYN) IVPB 3.375 g        3.375 g 12.5 mL/hr over 240 Minutes Intravenous Every 8 hours 08/25/21 1529     08/25/21 1245  piperacillin-tazobactam (ZOSYN) IVPB 3.375 g        3.375 g 100 mL/hr over 30 Minutes Intravenous  Once 08/25/21 1239 08/25/21 2010        Subjective: Patient was seen and examined at bedside.  Overnight events noted.   Patient reports feeling improved.  He is alert and oriented,  he is on 2 L of supplemental oxygen sats 96%.   Patient denies any chest pain.  Objective: Vitals:   08/26/21 1000 08/26/21 1100 08/26/21 1200 08/26/21 1300  BP: 107/70 108/71 124/81 114/75  Pulse: 84 92  88  Resp: 20 12 18 19   Temp:      TempSrc:      SpO2: 99% 97%  98%   No intake or output data in the 24 hours ending 08/26/21 1414 There were no vitals filed for this visit.  Examination:  General exam: Appears comfortable, deconditioned, not in any acute distress, chronically ill looking. Respiratory system: Decreased breath sounds on right side, good air entry on left side. Cardiovascular system: S1-S2 heard, regular rate and rhythm, no murmur. Gastrointestinal system: Abdomen is soft,  nontender, nondistended, BS +. Central nervous system: Alert and oriented x 3. No focal neurological deficits. Extremities: +Pedal edema, no cyanosis, no clubbing. Skin: No rashes, lesions or ulcers Psychiatry: Judgement and insight appear normal. Mood & affect appropriate.     Data Reviewed: I have personally reviewed  following labs and imaging studies  CBC: Recent Labs  Lab 08/25/21 0943 08/26/21 0341  WBC 12.3* 7.2  NEUTROABS 11.5*  --   HGB 13.1 11.8*  HCT 40.3 36.5*  MCV 96.9 96.8  PLT 279 950   Basic Metabolic Panel: Recent Labs  Lab 08/25/21 0943 08/26/21 0341  NA 137 144  K 3.5 3.2*  CL 106 103  CO2 26 28  GLUCOSE 106* 113*  BUN 25* 29*  CREATININE 1.37* 1.19  CALCIUM 8.4* 8.4*  MG 1.8 2.0  PHOS  --  3.5   GFR: CrCl cannot be calculated (Unknown ideal weight.). Liver Function Tests: Recent Labs  Lab 08/25/21 0943 08/26/21 0341  AST 27 19  ALT 33 26  ALKPHOS 230* 185*  BILITOT 0.9 0.8  PROT 6.1* 5.7*  ALBUMIN 2.9* 2.5*   No results for input(s): LIPASE, AMYLASE in the last 168 hours. No results for input(s): AMMONIA in the last 168 hours. Coagulation Profile: No results for input(s): INR, PROTIME in the last 168 hours. Cardiac Enzymes: No results for input(s): CKTOTAL, CKMB, CKMBINDEX, TROPONINI in the last 168 hours. BNP (last 3 results) No results for input(s): PROBNP in the last 8760 hours. HbA1C: No results for input(s): HGBA1C in the last 72 hours. CBG: No results for input(s): GLUCAP in the last 168 hours. Lipid Profile: No results for input(s): CHOL, HDL, LDLCALC, TRIG, CHOLHDL, LDLDIRECT in the last 72 hours. Thyroid Function Tests: No results for input(s): TSH, T4TOTAL, FREET4, T3FREE, THYROIDAB in the last 72 hours. Anemia Panel: No results for input(s): VITAMINB12, FOLATE, FERRITIN, TIBC, IRON, RETICCTPCT in the last 72 hours. Sepsis Labs: Recent Labs  Lab 08/25/21 0947 08/25/21 1215  LATICACIDVEN 2.3* 2.1*    Recent Results (from the past 240 hour(s))  Resp Panel by RT-PCR (Flu A&B, Covid) Nasopharyngeal Swab     Status: None   Collection Time: 08/25/21  9:50 AM   Specimen: Nasopharyngeal Swab; Nasopharyngeal(NP) swabs in vial transport medium  Result Value Ref Range Status   SARS Coronavirus 2 by RT PCR NEGATIVE NEGATIVE Final     Comment: (NOTE) SARS-CoV-2 target nucleic acids are NOT DETECTED.  The SARS-CoV-2 RNA is generally detectable in upper respiratory specimens during the acute phase of infection. The lowest concentration of SARS-CoV-2 viral copies this assay can detect is 138 copies/mL. A negative result does not preclude SARS-Cov-2 infection and should not be used as the sole basis for treatment or other patient management decisions. A negative result may occur with  improper specimen collection/handling, submission of specimen other than nasopharyngeal swab, presence of viral mutation(s) within the areas targeted by this assay, and inadequate number of viral copies(<138 copies/mL). A negative result must be combined with clinical observations, patient history, and epidemiological information. The expected result is Negative.  Fact Sheet for Patients:  EntrepreneurPulse.com.au  Fact Sheet for Healthcare Providers:  IncredibleEmployment.be  This test is no t yet approved or cleared by the [place] FDA and  has been authorized for detection and/or diagnosis of SARS-CoV-2 by FDA under an Emergency Use Authorization (EUA). This EUA will remain  in effect (meaning this test can be used) for the duration of the COVID-19 declaration under Section 564(b)(1) of  the Act, 21 U.S.C.section 360bbb-3(b)(1), unless the authorization is terminated  or revoked sooner.       Influenza A by PCR NEGATIVE NEGATIVE Final   Influenza B by PCR NEGATIVE NEGATIVE Final    Comment: (NOTE) The Xpert Xpress SARS-CoV-2/FLU/RSV plus assay is intended as an aid in the diagnosis of influenza from Nasopharyngeal swab specimens and should not be used as a sole basis for treatment. Nasal washings and aspirates are unacceptable for Xpert Xpress SARS-CoV-2/FLU/RSV testing.  Fact Sheet for Patients: EntrepreneurPulse.com.au  Fact Sheet for Healthcare  Providers: IncredibleEmployment.be  This test is not yet approved or cleared by the [place] FDA and has been authorized for detection and/or diagnosis of SARS-CoV-2 by FDA under an Emergency Use Authorization (EUA). This EUA will remain in effect (meaning this test can be used) for the duration of the COVID-19 declaration under Section 564(b)(1) of the Act, 21 U.S.C. section 360bbb-3(b)(1), unless the authorization is terminated or revoked.  Performed at [facility], [place] [address] [place], [place] [address]   Blood culture (routine x 2)     Status: None (Preliminary result)   Collection Time: 08/25/21 12:55 PM   Specimen: BLOOD  Result Value Ref Range Status   Specimen Description   Final    BLOOD BLOOD RIGHT HAND Performed at [facility] [address] [place], [place] [address]    Special Requests   Final    BOTTLES DRAWN AEROBIC AND ANAEROBIC Blood Culture results may not be optimal due to an inadequate volume of blood received in culture bottles Performed at [facility] [address], [place], [place] [address]    Culture   Final    NO GROWTH < 24 HOURS Performed at [facility] [address], [place], [place] [address]    Report Status PENDING  Incomplete  Blood culture (routine x 2)     Status: None (Preliminary result)   Collection Time: 08/25/21  1:03 PM   Specimen: BLOOD  Result Value Ref Range Status   Specimen Description   Final    BLOOD BLOOD RIGHT FOREARM Performed at [facility] [address], [place], [place] [address]    Special Requests   Final    BOTTLES DRAWN AEROBIC AND ANAEROBIC Blood Culture adequate volume Performed at [facility] [address] [place], [place] [address]    Culture   Final    NO GROWTH < 24 HOURS Performed at [facility] [address], [place], [place] [address]    Report Status  PENDING  Incomplete    Radiology Studies: CT Angio Chest PE W and/or Wo Contrast  Result Date: 08/25/2021 CLINICAL DATA:  Pulmonary embolism. History of lung cancer. Hypoxia EXAM: CT ANGIOGRAPHY CHEST WITH CONTRAST TECHNIQUE: Multidetector CT imaging of the chest was performed using the standard protocol during bolus administration of intravenous contrast. Multiplanar CT image reconstructions and MIPs were obtained to evaluate the vascular anatomy. CONTRAST:  72mL OMNIPAQUE IOHEXOL 350 MG/ML SOLN COMPARISON:  06/23/2021 FINDINGS: Cardiovascular: Satisfactory opacification of the pulmonary arteries to the segmental level. No evidence of pulmonary embolism. Normal heart size. No pericardial effusion. Porta catheter in place. Mediastinum/Nodes: Obstructed bronchus intermedius with central low-density appearance attributed to debris. Study is timed for arterial enhancement rather than parenchymal enhancement, cannot exclude superimposed airway tumor/infiltration. Patulous upper esophagus. No clear esophageal wall thickening Lungs/Pleura: Worsening aeration on the right where there is diffuse opacification, ground-glass density,  and reticulonodular appearance best seen at the pleura and apical aerated lung. Large left pleural effusion. Moderate right pleural effusion loculated at the base with pleural thickening. Upper Abdomen: No acute finding Musculoskeletal: Extensive sclerotic metastatic disease involving ribs and spine. Review of the MIP images confirms the above findings. IMPRESSION: 1. Debris obstructs the bronchus intermedius with worsening aeration at the right base that is likely pneumonia superimposed on pre-existing tumor related opacity. 2. Negative for pulmonary embolism. 3. Continued large left and moderate but complex right pleural effusion. Electronically Signed   By: Jorje Guild M.D.   On: 08/25/2021 11:19   DG Chest Portable 1 View  Result Date: 08/25/2021 CLINICAL DATA:  Short of breath.   Hypoxia.  Known lung carcinoma. EXAM: PORTABLE CHEST 1 VIEW COMPARISON:  08/05/2021 and older studies.  CT, 06/23/2021. FINDINGS: Hazy lung opacity lies in the left peripheral mid lung, increased from the prior exam. There is also hazy opacity at the left lung base that has increased. Small left effusion. Right pleural effusion, lung base opacity and interstitial thickening is stable. There is hazy airspace opacity in the right mid to upper lung that is new. Volume loss on the right is stable. No pneumothorax. Stable right anterior chest wall Port-A-Cath. IMPRESSION: 1. Interval worsening of lung aeration. 2. Hazy areas of airspace opacity have developed in the mid and lower lungs, on the right superimposed on the chronic changes related to lung carcinoma. Suspect multifocal infection or inflammation as the etiology. 3. Bilateral pleural effusions without convincing change. Electronically Signed   By: Lajean Manes M.D.   On: 08/25/2021 10:37   VAS [place] LOWER EXTREMITY VENOUS (DVT) (ONLY [facility] & [facility])  Result Date: 08/25/2021  Lower Venous DVT Study Patient Name:  SHAUL TRAUTMAN [facility]  Date of Exam:   08/25/2021 Medical Rec #: [id number]              Accession #:    [id number] Date of Birth: 09/29/1950               Patient Gender: M Patient Age:   42 years Exam Location:  [facility] Procedure:      VAS [place] LOWER EXTREMITY VENOUS (DVT) Referring Phys: Marda Stalker --------------------------------------------------------------------------------  Indications: Swelling.  Risk Factors: Cancer. Limitations: Poor ultrasound/tissue interface. Comparison Study: 03/28/2021 - RIGHT:                   - No evidence of common femoral vein obstruction.                    LEFT:                   - Findings consistent with acute deep vein thrombosis                   involving the left                   common femoral vein, SF junction, left femoral vein, left                   proximal                   profunda vein, left  popliteal vein, left posterior tibial                   veins, left  peroneal veins, and left gastrocnemius                   veins.                   - Findings consistent with acute superficial vein thrombosis                   involving the                   left small saphenous vein.                   - No cystic structure found in the popliteal fossa.                    06/05/2021 - RIGHT:                   - No evidence of common femoral vein obstruction.                    LEFT:                   - Findings consistent with age indeterminate deep vein                   thrombosis                   involving the left femoral vein, left proximal profunda vein,                   left                   popliteal vein, left posterior tibial veins, and left peroneal                   veins. When                   compared to prior study, there is resolution                   of the DVT seen at the level of the CFV/SFJ, however the                   remaining                   segments of the left lower extremity demonstrate no                   significant change.                   - No cystic structure found in the popliteal fossa. Performing Technologist: Oliver Hum RVT  Examination Guidelines: A complete evaluation includes B-mode imaging, spectral Doppler, color Doppler, and power Doppler as needed of all accessible portions of each vessel. Bilateral testing is considered an integral part of a complete examination. Limited examinations for reoccurring indications may be performed as noted. The reflux portion of the exam is performed with the patient in reverse Trendelenburg.  +-----+---------------+---------+-----------+----------+--------------+  RIGHT Compressibility Phasicity Spontaneity Properties Thrombus Aging  +-----+---------------+---------+-----------+----------+--------------+  CFV   Full            Yes       Yes                                     +-----+---------------+---------+-----------+----------+--------------+   +---------+---------------+---------+-----------+----------+-------------------+  LEFT      Compressibility Phasicity Spontaneity Properties Thrombus Aging       +---------+---------------+---------+-----------+----------+-------------------+  CFV       Full            Yes       Yes                                         +---------+---------------+---------+-----------+----------+-------------------+  SFJ       Full            No        No                     Chronic              +---------+---------------+---------+-----------+----------+-------------------+  FV Prox   None            No        No                     Chronic              +---------+---------------+---------+-----------+----------+-------------------+  FV Mid    None            No        No                     Chronic              +---------+---------------+---------+-----------+----------+-------------------+  FV Distal None            No        No                     Chronic              +---------+---------------+---------+-----------+----------+-------------------+  PFV       Partial         Yes       Yes                    Chronic              +---------+---------------+---------+-----------+----------+-------------------+  POP       None            No        No                     Chronic              +---------+---------------+---------+-----------+----------+-------------------+  PTV       None                                             Chronic              +---------+---------------+---------+-----------+----------+-------------------+  PERO                                                       Not well visualized  +---------+---------------+---------+-----------+----------+-------------------+  Gastroc   None  Chronic              +---------+---------------+---------+-----------+----------+-------------------+  SSV        None                                             Chronic              +---------+---------------+---------+-----------+----------+-------------------+     Summary: RIGHT: - No evidence of common femoral vein obstruction.  LEFT: - Findings consistent with chronic deep vein thrombosis involving the left femoral vein, left proximal profunda vein, left popliteal vein, left posterior tibial veins, and left gastrocnemius veins. - Findings consistent with chronic superficial vein thrombosis involving the left small saphenous vein. - No cystic structure found in the popliteal fossa.  *See table(s) above for measurements and observations. Electronically signed by Monica Martinez MD on 08/25/2021 at 12:20:31 PM.    Final     Scheduled Meds:  apixaban  5 mg Oral BID   cycloSPORINE  1 drop Both Eyes Daily   tamsulosin  0.4 mg Oral QHS   Continuous Infusions:  piperacillin-tazobactam (ZOSYN)  IV 3.375 g (08/26/21 1323)     LOS: 1 day    Time spent: 10 mins   Safia Panzer, MD [facility]   If 7PM-7AM, please contact night-coverage

## 2021-08-26 NOTE — Progress Notes (Signed)
Nurse reached out to provider Jeneen Rinks, NP because patient is alert and oriented times 1 and continues to get out of bed without assistance. No new orders at this time. Will continue to monitor.

## 2021-08-26 NOTE — Progress Notes (Signed)
Spouse had questions about patient's chemo med Afatinib. Spouse had notified the nurse that she gave the patient the dose of chemo med. Nurse was not present for the administration of medication. Primary nurse and charge nurse touch base with mid level provider and pharmacy to follow up with medication. Patient will not have to be placed on isolation for the medication per pharmacy. Pharmacy has notified nurses that the patient is not to receive medication due to active infection. Will continue to monitor.

## 2021-08-26 NOTE — ED Notes (Signed)
Pt had small BM in bed. Pt cleaned up. Pt removed O2 and frustrated having to wear it. Pt is 97% on RA.

## 2021-08-26 NOTE — ED Notes (Signed)
Pt had a small BM and sitting at the end of the bed. Pt cleaned up. Pt was able to walk with assistance to the head of the bed to sit down. Family still at bedside.

## 2021-08-27 ENCOUNTER — Telehealth: Payer: Self-pay | Admitting: Medical Oncology

## 2021-08-27 DIAGNOSIS — R410 Disorientation, unspecified: Secondary | ICD-10-CM | POA: Diagnosis present

## 2021-08-27 DIAGNOSIS — J189 Pneumonia, unspecified organism: Secondary | ICD-10-CM | POA: Diagnosis not present

## 2021-08-27 DIAGNOSIS — L8990 Pressure ulcer of unspecified site, unspecified stage: Secondary | ICD-10-CM | POA: Diagnosis present

## 2021-08-27 DIAGNOSIS — E440 Moderate protein-calorie malnutrition: Secondary | ICD-10-CM | POA: Diagnosis present

## 2021-08-27 LAB — CBC
HCT: 34.6 % — ABNORMAL LOW (ref 39.0–52.0)
Hemoglobin: 11.3 g/dL — ABNORMAL LOW (ref 13.0–17.0)
MCH: 31.7 pg (ref 26.0–34.0)
MCHC: 32.7 g/dL (ref 30.0–36.0)
MCV: 97.2 fL (ref 80.0–100.0)
Platelets: 291 10*3/uL (ref 150–400)
RBC: 3.56 MIL/uL — ABNORMAL LOW (ref 4.22–5.81)
RDW: 14.9 % (ref 11.5–15.5)
WBC: 8.9 10*3/uL (ref 4.0–10.5)
nRBC: 0 % (ref 0.0–0.2)

## 2021-08-27 LAB — BASIC METABOLIC PANEL
Anion gap: 9 (ref 5–15)
BUN: 25 mg/dL — ABNORMAL HIGH (ref 8–23)
CO2: 27 mmol/L (ref 22–32)
Calcium: 8.7 mg/dL — ABNORMAL LOW (ref 8.9–10.3)
Chloride: 105 mmol/L (ref 98–111)
Creatinine, Ser: 1.14 mg/dL (ref 0.61–1.24)
GFR, Estimated: >60 mL/min (ref >60)
Glucose, Bld: 124 mg/dL — ABNORMAL HIGH (ref 70–99)
Potassium: 3.6 mmol/L (ref 3.5–5.1)
Sodium: 141 mmol/L (ref 135–145)

## 2021-08-27 LAB — PHOSPHORUS: Phosphorus: 3.4 mg/dL (ref 2.5–4.6)

## 2021-08-27 LAB — MAGNESIUM: Magnesium: 2.2 mg/dL (ref 1.7–2.4)

## 2021-08-27 MED ORDER — ACETAMINOPHEN 10 MG/ML IV SOLN
1000.0000 mg | Freq: Once | INTRAVENOUS | Status: AC
  Administered 2021-08-27: 1000 mg via INTRAVENOUS
  Filled 2021-08-27: qty 100

## 2021-08-27 MED ORDER — PROSOURCE PLUS PO LIQD
30.0000 mL | Freq: Two times a day (BID) | ORAL | Status: DC
  Administered 2021-08-27: 30 mL via ORAL
  Filled 2021-08-27: qty 30

## 2021-08-27 MED ORDER — DEXAMETHASONE SODIUM PHOSPHATE 4 MG/ML IJ SOLN
4.0000 mg | INTRAMUSCULAR | Status: DC
  Administered 2021-08-28 – 2021-08-30 (×3): 4 mg via INTRAVENOUS
  Filled 2021-08-27 (×3): qty 1

## 2021-08-27 MED ORDER — LORAZEPAM 2 MG/ML IJ SOLN
0.5000 mg | Freq: Once | INTRAMUSCULAR | Status: AC
  Administered 2021-08-27: 0.5 mg via INTRAVENOUS
  Filled 2021-08-27: qty 1

## 2021-08-27 MED ORDER — LORAZEPAM 2 MG/ML IJ SOLN
0.2500 mg | Freq: Every day | INTRAMUSCULAR | Status: DC
  Administered 2021-08-27: 0.25 mg via INTRAVENOUS
  Filled 2021-08-27: qty 1

## 2021-08-27 MED ORDER — ACETAMINOPHEN 500 MG PO TABS
1000.0000 mg | ORAL_TABLET | Freq: Three times a day (TID) | ORAL | Status: DC
  Administered 2021-08-27: 1000 mg via ORAL
  Filled 2021-08-27: qty 2

## 2021-08-27 MED ORDER — DIAZEPAM 5 MG/ML IJ SOLN
5.0000 mg | Freq: Once | INTRAMUSCULAR | Status: AC
  Administered 2021-08-27: 5 mg via INTRAVENOUS
  Filled 2021-08-27: qty 2

## 2021-08-27 MED ORDER — HALOPERIDOL LACTATE 5 MG/ML IJ SOLN: 1.0000 mg | Freq: Once | INTRAMUSCULAR | Status: DC | PRN

## 2021-08-27 MED ORDER — BISACODYL 5 MG PO TBEC: 5.0000 mg | DELAYED_RELEASE_TABLET | Freq: Every day | ORAL | Status: DC | PRN

## 2021-08-27 MED ORDER — DIAZEPAM 5 MG/ML IJ SOLN
2.5000 mg | INTRAMUSCULAR | Status: DC | PRN
  Administered 2021-08-28 – 2021-08-30 (×3): 2.5 mg via INTRAVENOUS
  Filled 2021-08-27 (×3): qty 2

## 2021-08-27 MED ORDER — LORAZEPAM 2 MG/ML IJ SOLN: 0.2500 mg | INTRAMUSCULAR | Status: DC | PRN

## 2021-08-27 MED ORDER — ADULT MULTIVITAMIN W/MINERALS CH
1.0000 | ORAL_TABLET | Freq: Every day | ORAL | Status: DC
  Administered 2021-08-27 – 2021-08-29 (×3): 1 via ORAL
  Filled 2021-08-27 (×4): qty 1

## 2021-08-27 MED ORDER — MORPHINE SULFATE (PF) 2 MG/ML IV SOLN
1.0000 mg | INTRAVENOUS | Status: DC | PRN
Start: 2021-08-27 — End: 2021-08-30
  Administered 2021-08-27 – 2021-08-30 (×8): 1 mg via INTRAVENOUS
  Filled 2021-08-27 (×8): qty 1

## 2021-08-27 NOTE — Progress Notes (Signed)
Initial Nutrition Assessment  DOCUMENTATION CODES:   Non-severe (moderate) malnutrition in context of chronic illness  INTERVENTION:  - will order [employer] Breakfast with 2% milk TID with meals, each supplement provides 200 kcal and 10 grams protein.  - will order 30 ml Prosource Plus TID, each supplement provides 100 kcal and 15 grams protein.  - will order 1 tablet multivitamin with minerals/day. - recommend liberalize diet from Heart Healthy to Regular.    NUTRITION DIAGNOSIS:   Moderate Malnutrition related to chronic illness, cancer and cancer related treatments as evidenced by moderate fat depletion, moderate muscle depletion  GOAL:   Patient will meet greater than or equal to 90% of their needs  MONITOR:   PO intake, Supplement acceptance, Labs, Weight trends  REASON FOR ASSESSMENT:   Malnutrition Screening Tool  ASSESSMENT:   71 y.o. 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 He presented to the ED after his wife noticed that he was confused and weak, that his chronic dyspnea had worsened, that he had chills, and that appetite was decreased.  Patient laying in bed with no visitors present at the time of RD visit although RN shared that wife had been present earlier this afternoon.   Patient is noted to be a/o to self only. He was able to converse with RD during completion of NFPE. He denied abdominal pain or nausea. He does not recall the last time he had something to eat or drink but does not think he has had anything today. No other pertinent information able to be obtained from patient at this time.  He was followed by [facility] in 04/2021 and 05/2021. Last note was on 06/04/21 at which time patient reported having a good appetite overall and inconsistently drinking [employer].   Weight yesterday was 166 lb and weight on 06/04/21 was 199 lb. This indicates 33 lb weight loss (16.6% body weight) in the past 3 months; significant for time frame.    Labs reviewed; Medications reviewed; BUN: 25 mg/dl, Ca: 8.7 mg/dl.    NUTRITION - FOCUSED PHYSICAL EXAM:  Flowsheet Row Most Recent Value  Orbital Region Moderate depletion  Upper Arm Region Moderate depletion  Thoracic and Lumbar Region Unable to assess  Buccal Region Moderate depletion  Temple Region Moderate depletion  Clavicle Bone Region Moderate depletion  Clavicle and Acromion Bone Region Moderate depletion  Scapular Bone Region Unable to assess  Dorsal Hand Mild depletion  Patellar Region Moderate depletion  Anterior Thigh Region Moderate depletion  Posterior Calf Region Mild depletion  Edema (RD Assessment) None  Hair Reviewed  Eyes Reviewed  Mouth Unable to assess  Skin Reviewed  Nails Reviewed        Diet Order:   Diet Order             Diet Heart Room service appropriate? Yes; Fluid consistency: Thin  Diet effective now                   EDUCATION NEEDS:   Not appropriate for education at this time  Skin:  Skin Assessment: Skin Integrity Issues: Skin Integrity Issues:: Stage I Stage I: bilateral sacrum  Last BM:  PTA/unknown  Height:   Ht Readings from Last 1 Encounters:  08/26/21 5\' 11"  (1.803 m)    Weight:   Wt Readings from Last  1 Encounters:  08/26/21 75.5 kg     Estimated Nutritional Needs:  Kcal:  2150-2350 kcal Protein:  110-125 grams Fluid:  >/= 2.2 L/day     Jarome Matin, MS, RD, LDN Inpatient Clinical Dietitian RD pager # available in [place]  After hours/weekend pager # available in [facility]

## 2021-08-27 NOTE — Telephone Encounter (Signed)
Wife is requesting Palliative care consult while pt is in the hospital. I talked to wife and the referral has already been done.

## 2021-08-27 NOTE — Progress Notes (Signed)
During rounds nurse noted that patient had discontinued IV in left hand. Blood noted at site. Pressure dressing applied. No new orders at this time. Will continue to monitor.

## 2021-08-27 NOTE — Progress Notes (Signed)
DIAGNOSIS: Stage IV (T2b, N2, M1c) presented with right middle lobe lung mass in addition to mediastinal lymphadenopathy as well as malignant right pleural effusion with pleural-based nodules and suspicious right hepatic lesion and the brain metastasis diagnosed in July 2020.   Molecular Biomarkers: [id number] (Exon 20 insertion) 0.2% Dacomitinib,Neratinib,Osimertinib   [id number] 0.1% None            PRIOR THERAPY:  1) Systemic chemotherapy with carboplatin for AUC of 5, Alimta 500 mg/M2 and Keytruda 200 mg IV every 3 weeks.  First dose April 04, 2019.  Status post 21 cycles  Starting from cycle #5 the patient is on maintenance treatment with Alimta and Keytruda every 3 weeks.  Starting from cycle #19 he will be on single agent Alimta.  Ethan Brown was discontinued secondary to recurrent immunotherapy mediated pneumonitis. 2) whole brain irradiation for multiple metastatic brain lesions. 3)  Mobocertinib (Axkibity) 213 mg p.o. daily.  First dose was June 29, 2020.  Status post 6 months of treatment.  This was discontinued secondary to disease progression in the brain as well as the chest. 4) treatment was targeted therapy with Tagrisso 80 mg p.o. daily status post 7 weeks of treatment discontinued secondary to disease progression. 5) Amivantamab 350 Mg IV on day 1 followed by 1050 Mg IV on day 2 of cycle #1 started on March 12, 2021 status post 1 cycle.  Starting from cycle #2 his dose will be 1400 Mg IV weekly for 3 cycles followed by 1400 Mg IV every 2 weeks starting from cycle #5.  Status post 6 cycles.  This is discontinued secondary to disease progression. 6) Systemic chemotherapy with docetaxel 75 Mg/M2 every 3 weeks with Neulasta support.  Status post 2 cycle started on 05/14/2021.  He did not receive Cyramza because of the recent deep venous thrombosis and treatment with Eliquis.  This treatment was discontinued secondary to disease progression. 7) palliative radiotherapy to the left hip  area under the care of Dr. Tammi Brown   CURRENT THERAPY: GILOTRIF 30 mg p.o. daily.  First dose started July 24, 2021 Subjective: The patient is seen and examined today.  His wife was at the bedside.  He was so sleepy during the visit because of some sedation.  He was admitted with worsening dyspnea and suspicious pneumonia.  He continues to have agitation because of his shortness of breath.  He has no chest pain or hemoptysis.  He has no nausea, vomiting, diarrhea or constipation.  He was tolerating his treatment with Gilotrif fairly well.  Objective: Vital signs in last 24 hours: Temp:  [97.3 F (36.3 C)-98.7 F (37.1 C)] 97.8 F (36.6 C) (01/04 1200) Pulse Rate:  [66-129] 79 (01/04 1200) Resp:  [12-30] 19 (01/04 1200) BP: (98-144)/(59-109) 111/59 (01/04 1200) SpO2:  [88 %-97 %] 97 % (01/04 1200) Weight:  [166 lb 7.2 oz (75.5 kg)] 166 lb 7.2 oz (75.5 kg) (01/03 1844)  Intake/Output from previous day: 01/03 0701 - 01/04 0700 In: 139.4 [I.V.:10; IV Piggyback:129.4] Out: 250 [Urine:250] Intake/Output this shift: Total I/O In: -  Out: 100 [Urine:100]  General appearance: fatigued and moderate distress Resp: diminished breath sounds bilaterally and dullness to percussion bilaterally Cardio: regular rate and rhythm, S1, S2 normal, no murmur, click, rub or gallop GI: soft, non-tender; bowel sounds normal; no masses,  no organomegaly Extremities: extremities normal, atraumatic, no cyanosis or edema  Lab Results:  Recent Labs    08/26/21 0341 08/27/21 0301  WBC 7.2 8.9  HGB 11.8* 11.3*  HCT 36.5* 34.6*  PLT 273 291   BMET Recent Labs    08/26/21 0341 08/27/21 0301  NA 144 141  K 3.2* 3.6  CL 103 105  CO2 28 27  GLUCOSE 113* 124*  BUN 29* 25*  CREATININE 1.19 1.14  CALCIUM 8.4* 8.7*    Studies/Results: No results found.  Medications: I have reviewed the patient's current medications.   Assessment/Plan: This is a very pleasant 71 years old white male diagnosed  with a stage IV (T2b, N2, M1 C) non-small cell lung cancer, adenocarcinoma presented with right middle lobe lung mass in addition to mediastinal lymphadenopathy as well as malignant right pleural effusion and pleural-based nodules and hepatic metastasis as well as brain metastasis in July 2020.  The patient has positive EGFR mutation in exon 20.  He was treated with several chemotherapy regimens including carboplatin, Alimta and Keytruda followed by maintenance Alimta and Keytruda for a total of 21 cycles discontinued secondary to disease progression.  He also has whole brain irradiation for multiple metastatic brain lesions.  The patient was then treated with Mobocertinib (Axkibity) for 6 months discontinued secondary to disease progression followed by treatment with Tagrisso for 7 weeks again discontinued secondary to disease progression.  The patient then was treated with Amivantamab for 6 cycles discontinued secondary to disease progression.  He was started on second line systemic chemotherapy with docetaxel for 2 cycles discontinued secondary to disease progression. He is started treatment with single agent Gilotrif on July 24, 2021 and has been tolerating this treatment well before being admitted with worsening dyspnea secondary to disease progression versus pneumonia on top of his lung cancer. He is currently treated with Zosyn for the suspicious pneumonia. I had a lengthy discussion with the patient his wife today about his condition.  I explained to her his poor prognosis and the stronger, and for them to consider palliative care and hospice at this point. I also strongly recommend for the patient and his wife to consider DNR CODE STATUS. I will stop his treatment with Gilotrif for now. If the patient started feeling better and he is not interested in proceeding with palliative care and hospice I may discuss with him other treatment options like single agent gemcitabine. Thank you for taking good  care of Ethan Brown, I will continue to follow-up the patient with you and assist in his management on as-needed basis. Disclaimer: This note was dictated with voice recognition software. Similar sounding words can inadvertently be transcribed and may be missed upon review. Eilleen Kempf, MD    LOS: 2 days    Eilleen Kempf 08/27/2021

## 2021-08-27 NOTE — Progress Notes (Addendum)
PROGRESS NOTE    Ethan Brown  NFA:[id number] DOB: July 03, 1951 DOA: 08/25/2021 PCP: Laurey Morale, MD    Brief Narrative: This 71 years old male with PMH significant for hypertension, stage IV lung cancer with brain and bone metastasis, left detached vitreous humor, history of left LE DVT, hyperlipidemia, migraine headache, pseudophakia, transient global amnesia brought in the ED after his wife noticed that he was having confusion and weakness in the morning.  Patient also been coughing with pleuritic chest pain.  Patient was found to be hypotensive in ED requiring IV hydration.  Work-up in the ED venous duplex negative for DVT, chest x-ray shows bilateral pleural effusion with new areas of airspace opacity, CTA chest ruled out PE but shows bronchus intermedius with worsening aeration of the right base likely pneumonia superimposed on lung tumor.  Patient is admitted for possible postobstructive pneumonia and started on empiric antibiotics.  Patient continued to remain tachypneic, Pulmonology consulted, recommended continue prolonged course of IV antibiotics due to progression of the disease.   Assessment & Plan:   Principal Problem:   Postobstructive pneumonia Active Problems:   Pleural effusion   Adenocarcinoma of right lung, stage 4 (HCC)   Deep venous thrombosis of distal vein of left lower extremity (HCC)   Elevated troponin   Pressure injury of skin  Post obstructive pneumonia: Patient with history of stage IV lung cancer presented with cough, SOB, worsening confusion. Continue supplemental oxygen to keep saturation above 94%. Continue bronchodilators as needed. Continue Zosyn for possible aspiration pneumonia. Continue antitussive as needed. Pulmonology consulted,  recommended longer course of antibiotics.  Malignant Pleural effusion: Case discussed with CTS on-call. There is not adequate fluid for placement of pleural catheter. Patient is not a good surgical  candidate.  Adenocarcinoma of right lung stage IV: Patient follows up with Dr. Julien Nordmann as outpatient. Follow-up radiation oncology as scheduled. Given advanced disease,  long-term prognosis is poor.  Deep venous thrombosis of left lower extremity: Continue Eliquis  Elevated troponin Likely due to demand ischemia.   Patient denies any chest pain,  no significant uptrend.  DVT prophylaxis: Eliquis Code Status: Full code. Family Communication: Wife at bedside Disposition Plan:   Status is: Inpatient  Remains inpatient appropriate because:  Admitted for postobstructive pneumonia requiring IV antibiotics.   Consultants:  Pulmonology  Procedures: CTA chest Antimicrobials:   Anti-infectives (From admission, onward)    Start     Dose/Rate Route Frequency Ordered Stop   08/25/21 2000  piperacillin-tazobactam (ZOSYN) IVPB 3.375 g        3.375 g 12.5 mL/hr over 240 Minutes Intravenous Every 8 hours 08/25/21 1529     08/25/21 1245  piperacillin-tazobactam (ZOSYN) IVPB 3.375 g        3.375 g 100 mL/hr over 30 Minutes Intravenous  Once 08/25/21 1239 08/25/21 2010        Subjective: Patient was seen and examined at bedside.  Overnight events noted.   Patient was agitated and restless last night requiring soft restraints. Pulmonology consulted for worsening symptoms recommended to continue current medications. Patient reports feeling better, denies any chest pain.  Objective: Vitals:   08/27/21 0500 08/27/21 0700 08/27/21 0800 08/27/21 1000  BP: (!) 130/109 131/80 98/67 139/82  Pulse: (!) 120 (!) 129 80 (!) 106  Resp: (!) 25 (!) 30 19 (!) 28  Temp:      TempSrc:      SpO2: 94% 93% 94% 93%  Weight:      Height:  Intake/Output Summary (Last 24 hours) at 08/27/2021 1102 Last data filed at 08/27/2021 0400 Gross per 24 hour  Intake 139.41 ml  Output 250 ml  Net -110.59 ml   Filed Weights   08/26/21 1844  Weight: 75.5 kg    Examination:  General exam: Appears  chronically ill looking, deconditioned, not in any acute distress. Respiratory system: Decreased breath sounds on right side, good air entry on left side. Cardiovascular system: S1-S2 heard, regular rate and rhythm, no murmur. Gastrointestinal system: Abdomen is soft, nontender, nondistended, BS +. Central nervous system: Alert and oriented x 3. No focal neurological deficits. Extremities: +Pedal edema, no cyanosis, no clubbing. Skin: No rashes, lesions or ulcers Psychiatry: Mood & affect appropriate.     Data Reviewed: I have personally reviewed following labs and imaging studies  CBC: Recent Labs  Lab 08/25/21 0943 08/26/21 0341 08/27/21 0301  WBC 12.3* 7.2 8.9  NEUTROABS 11.5*  --   --   HGB 13.1 11.8* 11.3*  HCT 40.3 36.5* 34.6*  MCV 96.9 96.8 97.2  PLT 279 273 161   Basic Metabolic Panel: Recent Labs  Lab 08/25/21 0943 08/26/21 0341 08/27/21 0301  NA 137 144 141  K 3.5 3.2* 3.6  CL 106 103 105  CO2 26 28 27   GLUCOSE 106* 113* 124*  BUN 25* 29* 25*  CREATININE 1.37* 1.19 1.14  CALCIUM 8.4* 8.4* 8.7*  MG 1.8 2.0 2.2  PHOS  --  3.5 3.4   GFR: Estimated Creatinine Clearance: 63.3 mL/min (by C-G formula based on SCr of 1.14 mg/dL). Liver Function Tests: Recent Labs  Lab 08/25/21 0943 08/26/21 0341  AST 27 19  ALT 33 26  ALKPHOS 230* 185*  BILITOT 0.9 0.8  PROT 6.1* 5.7*  ALBUMIN 2.9* 2.5*   No results for input(s): LIPASE, AMYLASE in the last 168 hours. No results for input(s): AMMONIA in the last 168 hours. Coagulation Profile: No results for input(s): INR, PROTIME in the last 168 hours. Cardiac Enzymes: No results for input(s): CKTOTAL, CKMB, CKMBINDEX, TROPONINI in the last 168 hours. BNP (last 3 results) No results for input(s): PROBNP in the last 8760 hours. HbA1C: No results for input(s): HGBA1C in the last 72 hours. CBG: No results for input(s): GLUCAP in the last 168 hours. Lipid Profile: No results for input(s): CHOL, HDL, LDLCALC, TRIG,  CHOLHDL, LDLDIRECT in the last 72 hours. Thyroid Function Tests: No results for input(s): TSH, T4TOTAL, FREET4, T3FREE, THYROIDAB in the last 72 hours. Anemia Panel: No results for input(s): VITAMINB12, FOLATE, FERRITIN, TIBC, IRON, RETICCTPCT in the last 72 hours. Sepsis Labs: Recent Labs  Lab 08/25/21 0947 08/25/21 1215  LATICACIDVEN 2.3* 2.1*    Recent Results (from the past 240 hour(s))  Resp Panel by RT-PCR (Flu A&B, Covid) Nasopharyngeal Swab     Status: None   Collection Time: 08/25/21  9:50 AM   Specimen: Nasopharyngeal Swab; Nasopharyngeal(NP) swabs in vial transport medium  Result Value Ref Range Status   SARS Coronavirus 2 by RT PCR NEGATIVE NEGATIVE Final    Comment: (NOTE) SARS-CoV-2 target nucleic acids are NOT DETECTED.  The SARS-CoV-2 RNA is generally detectable in upper respiratory specimens during the acute phase of infection. The lowest concentration of SARS-CoV-2 viral copies this assay can detect is 138 copies/mL. A negative result does not preclude SARS-Cov-2 infection and should not be used as the sole basis for treatment or other patient management decisions. A negative result may occur with  improper specimen collection/handling, submission of specimen other than  nasopharyngeal swab, presence of viral mutation(s) within the areas targeted by this assay, and inadequate number of viral copies(<138 copies/mL). A negative result must be combined with clinical observations, patient history, and epidemiological information. The expected result is Negative.  Fact Sheet for Patients:  EntrepreneurPulse.com.au  Fact Sheet for Healthcare Providers:  IncredibleEmployment.be  This test is no t yet approved or cleared by the [place] FDA and  has been authorized for detection and/or diagnosis of SARS-CoV-2 by FDA under an Emergency Use Authorization (EUA). This EUA will remain  in effect (meaning this test can be used)  for the duration of the COVID-19 declaration under Section 564(b)(1) of the Act, 21 U.S.C.section 360bbb-3(b)(1), unless the authorization is terminated  or revoked sooner.       Influenza A by PCR NEGATIVE NEGATIVE Final   Influenza B by PCR NEGATIVE NEGATIVE Final    Comment: (NOTE) The Xpert Xpress SARS-CoV-2/FLU/RSV plus assay is intended as an aid in the diagnosis of influenza from Nasopharyngeal swab specimens and should not be used as a sole basis for treatment. Nasal washings and aspirates are unacceptable for Xpert Xpress SARS-CoV-2/FLU/RSV testing.  Fact Sheet for Patients: EntrepreneurPulse.com.au  Fact Sheet for Healthcare Providers: IncredibleEmployment.be  This test is not yet approved or cleared by the [place] FDA and has been authorized for detection and/or diagnosis of SARS-CoV-2 by FDA under an Emergency Use Authorization (EUA). This EUA will remain in effect (meaning this test can be used) for the duration of the COVID-19 declaration under Section 564(b)(1) of the Act, 21 U.S.C. section 360bbb-3(b)(1), unless the authorization is terminated or revoked.  Performed at [facility], [place] [address], [place], [place] [address]   Blood culture (routine x 2)     Status: None (Preliminary result)   Collection Time: 08/25/21 12:55 PM   Specimen: BLOOD  Result Value Ref Range Status   Specimen Description   Final    BLOOD BLOOD RIGHT HAND Performed at [facility] [address], [place], [place] [address]    Special Requests   Final    BOTTLES DRAWN AEROBIC AND ANAEROBIC Blood Culture results may not be optimal due to an inadequate volume of blood received in culture bottles Performed at [facility] [address] [place], [place] [address]    Culture   Final    NO GROWTH 2 DAYS Performed at [facility] [address] [place], [place] [address]     Report Status PENDING  Incomplete  Blood culture (routine x 2)     Status: None (Preliminary result)   Collection Time: 08/25/21  1:03 PM   Specimen: BLOOD  Result Value Ref Range Status   Specimen Description   Final    BLOOD BLOOD RIGHT FOREARM Performed at [facility] [address], [place], [place] [address]    Special Requests   Final    BOTTLES DRAWN AEROBIC AND ANAEROBIC Blood Culture adequate volume Performed at [facility] [address], [place], [place] [address]    Culture   Final    NO GROWTH 2 DAYS Performed at [facility] [address] [place], [place] [address]    Report Status PENDING  Incomplete  MRSA Next Gen by PCR, Nasal     Status: None   Collection Time: 08/26/21  5:02 PM   Specimen: Nasal Mucosa; Nasal Swab  Result Value Ref Range Status   MRSA by PCR Next  Gen NOT DETECTED NOT DETECTED Final    Comment: (NOTE) The GeneXpert MRSA Assay (FDA approved for NASAL specimens only), is one component of a comprehensive MRSA colonization surveillance program. It is not intended to diagnose MRSA infection nor to guide or monitor treatment for MRSA infections. Test performance is not FDA approved in patients less than 14 years old. Performed at [facility], [place] [address], [place], [place] [address]     Radiology Studies: CT Angio Chest PE W and/or Wo Contrast  Result Date: 08/25/2021 CLINICAL DATA:  Pulmonary embolism. History of lung cancer. Hypoxia EXAM: CT ANGIOGRAPHY CHEST WITH CONTRAST TECHNIQUE: Multidetector CT imaging of the chest was performed using the standard protocol during bolus administration of intravenous contrast. Multiplanar CT image reconstructions and MIPs were obtained to evaluate the vascular anatomy. CONTRAST:  51mL OMNIPAQUE IOHEXOL 350 MG/ML SOLN COMPARISON:  06/23/2021 FINDINGS: Cardiovascular: Satisfactory opacification of the pulmonary arteries to the segmental level.  No evidence of pulmonary embolism. Normal heart size. No pericardial effusion. Porta catheter in place. Mediastinum/Nodes: Obstructed bronchus intermedius with central low-density appearance attributed to debris. Study is timed for arterial enhancement rather than parenchymal enhancement, cannot exclude superimposed airway tumor/infiltration. Patulous upper esophagus. No clear esophageal wall thickening Lungs/Pleura: Worsening aeration on the right where there is diffuse opacification, ground-glass density, and reticulonodular appearance best seen at the pleura and apical aerated lung. Large left pleural effusion. Moderate right pleural effusion loculated at the base with pleural thickening. Upper Abdomen: No acute finding Musculoskeletal: Extensive sclerotic metastatic disease involving ribs and spine. Review of the MIP images confirms the above findings. IMPRESSION: 1. Debris obstructs the bronchus intermedius with worsening aeration at the right base that is likely pneumonia superimposed on pre-existing tumor related opacity. 2. Negative for pulmonary embolism. 3. Continued large left and moderate but complex right pleural effusion. Electronically Signed   By: Jorje Guild M.D.   On: 08/25/2021 11:19    Scheduled Meds:  apixaban  5 mg Oral BID   Chlorhexidine Gluconate Cloth  6 each Topical Daily   cycloSPORINE  1 drop Both Eyes Daily   sodium chloride flush  10-40 mL Intracatheter Q12H   tamsulosin  0.4 mg Oral QHS   Continuous Infusions:  piperacillin-tazobactam (ZOSYN)  IV Stopped (08/27/21 0931)     LOS: 2 days    Time spent: 35 mins   Samon Dishner, MD [facility]   If 7PM-7AM, please contact night-coverage

## 2021-08-27 NOTE — Progress Notes (Signed)
Nurse placed restricts on patient due to pulling at lines and medical equipment. Patient also undressed himself. Nurse reached out to provider Jeneen Rinks, NP for restricts order. Will continue to monitor.

## 2021-08-27 NOTE — Progress Notes (Signed)
Nurse reached out to provider Jeneen Rinks, NP for guidance with patient. Patient unable to follow commands and continues to get out of bed. Patient has had prn ativan and waist restrict placed. Nurse placed bilateral wrist restricts on patient and new prn orders has been placed on patient. Will continue to monitor.

## 2021-08-27 NOTE — Progress Notes (Signed)
Wife says pt just went to sleep and would like to wait until later to start CPT.

## 2021-08-27 NOTE — Progress Notes (Signed)
Nurse reached out to provider Jeneen Rinks, NP in reference to patient's morning labs. Nurse noted the potassium and calcium were low. See labs for results. No new orders at this time. Will continue to monitor.

## 2021-08-27 NOTE — Consult Note (Addendum)
NAME:  Ethan Brown, Ethan Brown:  [id number], DOB:  [birth year], LOS: 2 ADMISSION DATE:  08/25/2021, CONSULTATION DATE:  08/27/21  REFERRING MD:  Evelina Dun, CHIEF COMPLAINT:  DOE   History of Present Illness:  71 y.o. man with lung cancer admitted for SOB and confusion found to have progression of lung cancer with what appears to be bronchus intermedius compression/obliteration with RLL atelectasis and medial RML atelectasis and enlarged pulmonary nodules being treated for acute hypoxemic respiratory failure and presumed-postobstructive pneumonia.   Per patient wife from which majority of history is provided, patient started feeling unwell a few days prior to admission.  Increasing lethargy.  Some shortness of breath.  Mild cough.  Brought to the emergency room.  Chest x-ray showed significant opacification of the right with persistent left pleural effusion on my review interpretation.  He was hypoxemic which is new.  This prompted CTA PE protocol given his increased risk of embolus with active malignancy.  It is pertinent however that he has been on anticoagulation for VTE in the past.  This was negative for pulmonary embolus but did reveal obliteration of the BI from tumor compression, complete atelectasis left lower lobe, partial Elexis medial segment right middle lobe with mild or minimal aeration of the lateral segment of right middle lobe, increase in size of prominent pulmonary nodule on the left with similar appearing chronic loculated right pleural effusion and similar appearing chronic left pleural effusion.  He was admitted.  Telemetry monitor productive pneumonia, Zosyn.  He continues to have mild tachypnea, ongoing hypoxemia.  This prompted consultation.  Reviewed most recent CT scans x2 which were compared to current CT scan shows significant progression of disease with similar appearing pleural effusions on my review of interpretation.  Pertinent  Medical History  Lung cancer stage 4 on  6th line therapy  Significant Hospital Events: Including procedures, antibiotic start and stop dates in addition to other pertinent events   1/4 Pulmonary consult for dyspnea, abnormal CT  Interim History / Subjective:  N/a  Objective   Blood pressure 98/67, pulse 80, temperature 97.7 F (36.5 C), temperature source Axillary, resp. rate 19, height 5\' 11"  (1.803 m), weight 75.5 kg, SpO2 94 %.        Intake/Output Summary (Last 24 hours) at 08/27/2021 0945 Last data filed at 08/27/2021 0400 Gross per 24 hour  Intake 139.41 ml  Output 250 ml  Net -110.59 ml   Filed Weights   08/26/21 1844  Weight: 75.5 kg    Examination: General: Sleepy, appears comfortable Neck: supple, no JVP appreciated at 60 degrees Eyes: no icterus, EOMI Lungs: NWOB, on 4L [place] Cardiovascular: Warm, irregular rhythm  Abdomen: ND, BS present MSK: no joint effusion, no synovitis Neuro: no weakness, sensation appears intact Psych: arousable, fatigued  Resolved Hospital Problem list     Assessment & Plan:  Acute hypoxemic respiratory failure due to presumed postobstructive pneumonia: CT scan with dense atelectasis of the left lower lobe and partial atelectasis and consolidation of the right middle lobe in the setting of what appears to be extrinsic compression from increased size of mass compressing bronchus intermedius.  In addition, increased size of pulmonary nodules.  Altogether, concerning for worsening or progression of malignancy.  Stable chronic right-sided pleural effusion that is loculated, see little benefit therapeutically to try to intervene on this.  Could consider sampling in the future if concern for empyema as there although admittedly imaging is most consistent with postobstructive atelectasis versus true pneumonia.  However,  given mild leukocytosis, would agree with antibiotics.  If we assume this is postobstructive would need prolonged course of oral antibiotics at discharge.  I am not overly  optimistic that antibiotics will actually help.  Discussed concern for further progression of disease causing current illness with wife. Discussed no real fix for this as such recommended DNR status and to consider comfort measures if we are unsuccessful in improving trajectory.  --Continue antibiotics, recommend total duration of 4 weeks to be completed with oral Augmentin at time of discharge --If concern for worsening or failure to improve could consider sampling right-sided pleural effusion to eval for empyema although again clinical sufficient is very low --Consider therapeutic thoracentesis on the left for dyspnea if needed this admission  We will sign off.  Best Practice (right click and "Reselect all SmartList Selections" daily)   Per primary  Labs   CBC: Recent Labs  Lab 08/25/21 0943 08/26/21 0341 08/27/21 0301  WBC 12.3* 7.2 8.9  NEUTROABS 11.5*  --   --   HGB 13.1 11.8* 11.3*  HCT 40.3 36.5* 34.6*  MCV 96.9 96.8 97.2  PLT 279 273 468    Basic Metabolic Panel: Recent Labs  Lab 08/25/21 0943 08/26/21 0341 08/27/21 0301  NA 137 144 141  K 3.5 3.2* 3.6  CL 106 103 105  CO2 26 28 27   GLUCOSE 106* 113* 124*  BUN 25* 29* 25*  CREATININE 1.37* 1.19 1.14  CALCIUM 8.4* 8.4* 8.7*  MG 1.8 2.0 2.2  PHOS  --  3.5 3.4   GFR: Estimated Creatinine Clearance: 63.3 mL/min (by C-G formula based on SCr of 1.14 mg/dL). Recent Labs  Lab 08/25/21 0943 08/25/21 0947 08/25/21 1215 08/26/21 0341 08/27/21 0301  WBC 12.3*  --   --  7.2 8.9  LATICACIDVEN  --  2.3* 2.1*  --   --     Liver Function Tests: Recent Labs  Lab 08/25/21 0943 08/26/21 0341  AST 27 19  ALT 33 26  ALKPHOS 230* 185*  BILITOT 0.9 0.8  PROT 6.1* 5.7*  ALBUMIN 2.9* 2.5*   No results for input(s): LIPASE, AMYLASE in the last 168 hours. No results for input(s): AMMONIA in the last 168 hours.  ABG    Component Value Date/Time   PHART 7.411 03/28/2019 1134   PCO2ART 35.8 03/28/2019 1134    PO2ART 89.5 03/28/2019 1134   HCO3 22.3 03/28/2019 1134   ACIDBASEDEF 1.7 03/28/2019 1134   O2SAT 96.7 03/28/2019 1134     Coagulation Profile: No results for input(s): INR, PROTIME in the last 168 hours.  Cardiac Enzymes: No results for input(s): CKTOTAL, CKMB, CKMBINDEX, TROPONINI in the last 168 hours.  HbA1C: No results found for: HGBA1C  CBG: No results for input(s): GLUCAP in the last 168 hours.  Review of Systems:   Unable to obtain due to mild encephalopathy, tiredness  Past Medical History:  He,  has a past medical history of Borderline hypertension, Brain metastasis ([place]) (dx'd 01/2019), Detached vitreous humor, left, DVT (deep venous thrombosis) ([place]), Dyspnea, ED (erectile dysfunction) of organic origin, Hyperlipidemia, Hypertension, Lens subluxation, left, Lung cancer ([place]) (dx'd 01/2019), Migraine headache, Pseudophakia, and TGA (transient global amnesia) (2017).   Surgical History:   Past Surgical History:  Procedure Laterality Date   CAROTID DOPPLERS  09/2017   Mild non-obstructive disease   CATARACT EXTRACTION, BILATERAL     cataract, left  2018   CHEST TUBE INSERTION Right 03/29/2019   Procedure: INSERTION PLEURAL DRAINAGE CATHETER;  Surgeon: Roxan Hockey,  Revonda Standard, MD;  Location: [facility];  Service: Thoracic;  Laterality: Right;   COLONOSCOPY  2016   clear, repeat in 10 yrs    Eye surgeries     , Lens attachment.  Vitrectomy   FEMORAL HERNIA REPAIR     HERNIA REPAIR     IR THORACENTESIS ASP PLEURAL SPACE W/IMG GUIDE  02/22/2019   IR THORACENTESIS ASP PLEURAL SPACE W/IMG GUIDE  03/13/2019   IR THORACENTESIS ASP PLEURAL SPACE W/IMG GUIDE  03/18/2020   LUMBAR LAMINECTOMY     PLEURAL BIOPSY Right 03/29/2019   Procedure: PLEURAL BIOPSY;  Surgeon: [name], MD;  Location: [facility];  Service: Thoracic;  Laterality: Right;   PORTACATH PLACEMENT N/A 03/29/2019   Procedure: INSERTION PORT-A-CATH;  Surgeon: [name], MD;  Location: [facility];   Service: Thoracic;  Laterality: N/A;   retinal attachment, right     sclearl buckle, right     TRANSTHORACIC ECHOCARDIOGRAM  12/14/2019   EF 50 EF 55% (low normal).  Abnormal septal wall motion related to BBB; indeterminate diastolic parameters.  Mild RV enlargement with mildly reduced function.  Normal valves.  Normal atrial sizes.   victrectomy,right     VIDEO ASSISTED THORACOSCOPY Right 03/29/2019   Procedure: VIDEO ASSISTED THORACOSCOPY;  Surgeon: [name], MD;  Location: [facility] OR;  Service: Thoracic;  Laterality: Right;     Social History:   reports that he has never smoked. He has never used smokeless tobacco. He reports current alcohol use of about 1.0 standard drink per week. He reports that he does not use drugs.   Family History:  His family history includes Alcohol abuse in his mother; Cancer in his father; Diabetes in his father; Heart attack (age of onset: 29) in his maternal grandfather; Hypertension in his father; Prostate cancer in his father.   Allergies No Known Allergies   Home Medications  Prior to Admission medications   Medication Sig Start Date End Date Taking? Authorizing Provider  afatinib dimaleate (GILOTRIF) 30 MG tablet Take 1 tablet (30 mg total) by mouth daily. Take on an empty stomach 1hr before or 2hrs after meals. Patient taking differently: Take 30 mg by mouth daily before breakfast. 07/16/21  Yes Curt Bears, MD  apixaban (ELIQUIS) 5 MG TABS tablet Take 1 tablet (5 mg total) by mouth 2 (two) times daily. 06/09/21  Yes Heilingoetter, Cassandra L, PA-C  cycloSPORINE (RESTASIS) 0.05 % ophthalmic emulsion Place 1 drop into both eyes in the morning.   Yes [provider]  diphenhydrAMINE (BENADRYL) 25 MG tablet Take 50 mg by mouth at bedtime as needed for sleep.   Yes [provider]  furosemide (LASIX) 20 MG tablet TAKE 2TABS IN THE MORNING AND 1 TAB IN THE EVENING Patient taking differently: Take 20-40 mg by mouth See admin  instructions. TAKE 2TABS IN THE MORNING AND 1 TAB IN THE EVENING 08/12/21  Yes Curt Bears, MD  HYDROcodone bit-homatropine (HYCODAN) 5-1.5 MG/5ML syrup Take 5 mLs by mouth every 6 (six) hours as needed for cough. 05/07/21  Yes Curt Bears, MD  LORazepam (ATIVAN) 1 MG tablet Take 1 mg by mouth at bedtime as needed for anxiety or sleep.   Yes [provider]  magic mouthwash SOLN Take 5 mLs by mouth 4 (four) times daily as needed for mouth pain. Swish and spit or swallow 01/14/21  Yes Heilingoetter, Cassandra L, PA-C  Multiple Vitamins-Minerals (MULTIVITAMIN WITH MINERALS) tablet Take 1 tablet by mouth daily.   Yes [provider]  potassium chloride (KLOR-CON) 10 MEQ tablet TAKE 1 TABLET BY MOUTH EVERY DAY Patient taking differently: 10 mEq daily. 08/12/21  Yes Heilingoetter, Cassandra L, PA-C  prochlorperazine (COMPAZINE) 10 MG tablet Take 1 tablet (10 mg total) by mouth every 6 (six) hours as needed for nausea or vomiting. 05/21/20  Yes Curt Bears, MD  tamsulosin (FLOMAX) 0.4 MG CAPS capsule Take 0.4 mg by mouth at bedtime. 12/22/19  Yes [provider]  hydrochlorothiazide (MICROZIDE) 12.5 MG capsule TAKE 1 CAPSULE BY MOUTH EVERY DAY Patient not taking: Reported on 08/25/2021 01/06/21   Leonie Man, MD  temazepam (RESTORIL) 15 MG capsule Take 1 capsule (15 mg total) by mouth at bedtime as needed for sleep. Patient not taking: Reported on 08/25/2021 08/06/21   Curt Bears, MD     Critical care time: n/a

## 2021-08-27 NOTE — Consult Note (Signed)
Palliative Care  Consult Note Reason for Consultation: Goals of Care  71 yo man with metastatic lung cancer, admitted with respiratory failure and probable post-obstructive PNA.  I met with Ethan Brown and his wife at bedside to introduce the concept of palliative care and to discuss his current condition, prognosis and goals of care.  Summary:  LIMITED CODE, No intubation, No CPR Plan to address his goals of care in more detail if and when he is able.  Fortunately he does not have pain related to his bone mets-but he is dyspneic and struggling with completing sentences.  He also appears to have some delirium -worse in evening-known brain mets and disease progression. Main goal is for him is to be able to sleep tonight- he has not slept well since admission and has been agitated.  Recommendations: Valium 29m IV qhs for sleep and prn for agitation Start Decadron 43mIV in early AM (holding until morning so it does not interfere with his sleep) He was refusing PO and agitated so will give IV tylenol Bladder Scan PRN and monitor for retension (high risk) place a foley if needed Low dose morphine for dyspnea and pain Bronchodilators and inhaled corticosteroids for airway obstruction  Consider Focused Radiotherapy to the area of lung obstruction or possible stenting -may help from a symptom standpoint to do a thoracentesis on the very large left pleural effusion. His albumin is very low at 2.5- needs protein supplementation and decadron will hep stimulate his appetite.  Will follow closely and continue ongoing goals of care discussions.  ElLane HackerDO Palliative Medicine

## 2021-08-27 NOTE — Progress Notes (Signed)
Around the 2200 hour, the nurse bladder scanned patient per provider Golding's request. Nurse noted 152ml in the collection canister which connected to patient's external catheter. Bladder scan noted 141ml in the bladder. New prn orders placed see MAR. Will continue to monitor.

## 2021-08-28 ENCOUNTER — Other Ambulatory Visit (HOSPITAL_COMMUNITY): Payer: Self-pay

## 2021-08-28 ENCOUNTER — Encounter: Payer: Self-pay | Admitting: Internal Medicine

## 2021-08-28 ENCOUNTER — Inpatient Hospital Stay (HOSPITAL_COMMUNITY): Payer: Medicare Other

## 2021-08-28 DIAGNOSIS — Z9889 Other specified postprocedural states: Secondary | ICD-10-CM | POA: Diagnosis not present

## 2021-08-28 DIAGNOSIS — J90 Pleural effusion, not elsewhere classified: Secondary | ICD-10-CM | POA: Diagnosis not present

## 2021-08-28 DIAGNOSIS — J189 Pneumonia, unspecified organism: Secondary | ICD-10-CM | POA: Diagnosis not present

## 2021-08-28 LAB — BASIC METABOLIC PANEL
Anion gap: 9 (ref 5–15)
BUN: 22 mg/dL (ref 8–23)
CO2: 29 mmol/L (ref 22–32)
Calcium: 8.5 mg/dL — ABNORMAL LOW (ref 8.9–10.3)
Chloride: 104 mmol/L (ref 98–111)
Creatinine, Ser: 0.99 mg/dL (ref 0.61–1.24)
GFR, Estimated: >60 mL/min (ref >60)
Glucose, Bld: 104 mg/dL — ABNORMAL HIGH (ref 70–99)
Potassium: 3.6 mmol/L (ref 3.5–5.1)
Sodium: 142 mmol/L (ref 135–145)

## 2021-08-28 LAB — CBC
HCT: 32 % — ABNORMAL LOW (ref 39.0–52.0)
Hemoglobin: 10.3 g/dL — ABNORMAL LOW (ref 13.0–17.0)
MCH: 31.2 pg (ref 26.0–34.0)
MCHC: 32.2 g/dL (ref 30.0–36.0)
MCV: 97 fL (ref 80.0–100.0)
Platelets: 237 10*3/uL (ref 150–400)
RBC: 3.3 MIL/uL — ABNORMAL LOW (ref 4.22–5.81)
RDW: 14.9 % (ref 11.5–15.5)
WBC: 6.6 10*3/uL (ref 4.0–10.5)
nRBC: 0 % (ref 0.0–0.2)

## 2021-08-28 NOTE — Plan of Care (Signed)
Discussed with patient plan of care for the evening, pain management and toileting to use the urinal versus the primofit with some teach back displayed  Problem: Education: Goal: Knowledge of General Education information will improve Description: Including pain rating scale, medication(s)/side effects and non-pharmacologic comfort measures Outcome: Progressing   Problem: Health Behavior/Discharge Planning: Goal: Ability to manage health-related needs will improve Outcome: Progressing   Problem: Elimination: Goal: Will not experience complications related to urinary retention Outcome: Progressing

## 2021-08-28 NOTE — Progress Notes (Signed)
°  Transition of Care [facility]) Screening Note   Patient Details  Name: Ethan Brown Date of Birth: 1950-10-18   Transition of Care [facility]) CM/SW Contact:    Karuna Balducci, Marjie Skiff, RN Phone Number: 08/28/2021, 12:58 PM    Transition of Care Department [facility]) has reviewed patient and no TOC needs have been identified at this time. We will continue to monitor patient advancement through interdisciplinary progression rounds. If new patient transition needs arise, please place a TOC consult.   30 Day Unplanned Readmission Risk Score    Flowsheet Row ED to Hosp-Admission (Current) from 08/25/2021 in [facility]-ICU/STEPDOWN  30 Day Unplanned Readmission Risk Score (%) 26.4 Filed at 08/28/2021 1200       This score is the patient's risk of an unplanned readmission within 30 days of being discharged (0 -100%). The score is based on dignosis, age, lab data, medications, orders, and past utilization.   Low:  0-14.9   Medium: 15-21.9   High: 22-29.9   Extreme: 30 and above         Readmission Risk Prevention Plan 08/28/2021 03/30/2019  Post Dischage Appt - Complete  Medication Screening - Complete  Transportation Screening Complete Complete  PCP or Specialist Appt within 3-5 Days Complete -  HRI or Home Care Consult Complete -  Social Work Consult for Recovery Care Planning/Counseling Complete -  Palliative Care Screening Complete -  Medication Review Press photographer) Complete -  Some recent data might be hidden

## 2021-08-28 NOTE — Progress Notes (Signed)
Nurse bladder scanned the patient around 0200. The bladder scanner showed 78 ml. Total urinary output for the patient during night shift was 150 ml. Nurse reached out to provider Jeneen Rinks, NP for guidance. See orders for intervention. Will continue to monitor.

## 2021-08-28 NOTE — Progress Notes (Signed)
Pharmacy Antibiotic Note  Ethan Brown is a 71 y.o. male admitted on 08/25/2021 with postobstructive pneumonia.  Pharmacy has been consulted for Zosyn dosing.  Plan: Zosyn 3.375g IV q8h (4 hour infusion). Dosage remains stable and need for further dosage adjustment appears unlikely at present.  Pharmacy will sign off at this time.  Please reconsult if a change in clinical status warrants re-evaluation of dosage.    Height: 5\' 11"  (180.3 cm) Weight: 75.5 kg (166 lb 7.2 oz) IBW/kg (Calculated) : 75.3  Temp (24hrs), Avg:97.7 F (36.5 C), Min:97.2 F (36.2 C), Max:98.2 F (36.8 C)  Recent Labs  Lab 08/25/21 0943 08/25/21 0947 08/25/21 1215 08/26/21 0341 08/27/21 0301 08/28/21 0316  WBC 12.3*  --   --  7.2 8.9 6.6  CREATININE 1.37*  --   --  1.19 1.14 0.99  LATICACIDVEN  --  2.3* 2.1*  --   --   --      Estimated Creatinine Clearance: 71.8 mL/min (by C-G formula based on SCr of 0.99 mg/dL).    No Known Allergies  Antimicrobials this admission: 1/2 Zosyn >>   Dose adjustments this admission:   Microbiology results: 1/2 BCx: ngtd 1/2 Covid neg; influenza neg 1/3 MRSA PCR: not detected    Thank you for allowing pharmacy to be a part of this patients care.  Gretta Arab PharmD, BCPS Clinical Pharmacist [facility] main pharmacy [phone] 08/28/2021 10:36 AM

## 2021-08-28 NOTE — Progress Notes (Signed)
PROGRESS NOTE    Ethan Brown  VQQ:[id number] DOB: 08-12-1951 DOA: 08/25/2021 PCP: Laurey Morale, MD    Brief Narrative: This 71 years old male with PMH significant for hypertension, stage IV lung cancer with brain and bone metastasis, left detached vitreous humor, history of left LE DVT, hyperlipidemia, migraine headache, pseudophakia, transient global amnesia brought in the ED after his wife noticed that he was having confusion and weakness in the morning.  Patient also been coughing with pleuritic chest pain.  Patient was found to be hypotensive in ED requiring IV hydration.  Work-up in the ED venous duplex negative for DVT, chest x-ray shows bilateral pleural effusion with new areas of airspace opacity, CTA chest ruled out PE but shows bronchus intermedius with worsening aeration of the right base likely pneumonia superimposed on lung tumor.  Patient is admitted for possible postobstructive pneumonia and started on empiric antibiotics.  Patient continued to remain tachypneic, Pulmonology consulted, recommended prolonged course of IV antibiotics due to progression of the disease. Patient has poor prognosis.  Palliative care consulted, goals of care discussed, CODE STATUS changed to DNR.  Patient and family they are more receptive about considering hospice.   Assessment & Plan:   Principal Problem:   Postobstructive pneumonia Active Problems:   Pleural effusion   Adenocarcinoma of right lung, stage 4 (HCC)   Deep venous thrombosis of distal vein of left lower extremity (HCC)   Elevated troponin   Pressure injury of skin   Malnutrition of moderate degree   Acute delirium  Post obstructive pneumonia: Patient with history of stage IV lung cancer presented with cough, SOB, worsening confusion. Continue supplemental oxygen to keep saturation above 94%. Continue bronchodilators as needed. Continue Zosyn for possible aspiration pneumonia. Continue antitussive as needed. Pulmonology  consulted,  recommended longer course of antibiotics. Consider therapeutic thoracocentesis on left for worsening shortness of breath.  Malignant Pleural effusion: Case discussed with CTS on-call. There is not adequate fluid for placement of pleural catheter. Patient is not a good surgical candidate.  Adenocarcinoma of right lung stage IV: Patient follows up with Dr. Julien Nordmann as outpatient. Follow-up radiation oncology as scheduled. Given advanced disease,  long-term prognosis is poor. Dr. Julien Nordmann has long discussion with family and family is receptive about considering hospice.  Deep venous thrombosis of left lower extremity: Continue Eliquis  Elevated troponin Likely due to demand ischemia.   Patient denies any chest pain,  no significant uptrend.  DVT prophylaxis: Eliquis Code Status: Full code. Family Communication: Wife at bedside Disposition Plan:   Status is: Inpatient  Remains inpatient appropriate because:  Admitted for postobstructive pneumonia requiring IV antibiotics.  Palliative care consulted to discuss goals of care,  family is receptive about considering hospice.   Consultants:  Pulmonology Palliative care  Procedures: CTA chest Antimicrobials:   Anti-infectives (From admission, onward)    Start     Dose/Rate Route Frequency Ordered Stop   08/25/21 2000  piperacillin-tazobactam (ZOSYN) IVPB 3.375 g        3.375 g 12.5 mL/hr over 240 Minutes Intravenous Every 8 hours 08/25/21 1529     08/25/21 1245  piperacillin-tazobactam (ZOSYN) IVPB 3.375 g        3.375 g 100 mL/hr over 30 Minutes Intravenous  Once 08/25/21 1239 08/25/21 2010        Subjective: Patient was seen and examined at bedside.  Overnight events noted.   Patient reports feeling better,  he still has significant amount of coughing but vitals are stable.  Goals of care discussed in detail.  Patient has poor prognosis.   Wife is receptive about considering hospice.  CODE STATUS changed to  DNR   Objective: Vitals:   08/28/21 0400 08/28/21 0500 08/28/21 0700 08/28/21 0800  BP: 130/76 110/71 130/75   Pulse: 94 90    Resp: 20 20 16    Temp: 97.6 F (36.4 C)   97.8 F (36.6 C)  TempSrc: Axillary   Axillary  SpO2: 94% 98%    Weight:      Height:        Intake/Output Summary (Last 24 hours) at 08/28/2021 1155 Last data filed at 08/28/2021 0500 Gross per 24 hour  Intake 150 ml  Output 350 ml  Net -200 ml   Filed Weights   08/26/21 1844  Weight: 75.5 kg    Examination:  General exam: Appears comfortable, chronically ill looking, deconditioned, not in any acute distress. Respiratory system: Decreased breath sounds bilaterally, respiratory effort normal.  RR 15 Cardiovascular system: S1-S2 heard, regular rate and rhythm, no murmur. Gastrointestinal system: Abdomen is soft, nontender, nondistended, BS +. Central nervous system: Alert and oriented x 2. No focal neurological deficits. Extremities: +Pedal edema, no cyanosis, no clubbing. Skin: No rashes, lesions or ulcers Psychiatry: Mood & affect appropriate.     Data Reviewed: I have personally reviewed following labs and imaging studies  CBC: Recent Labs  Lab 08/25/21 0943 08/26/21 0341 08/27/21 0301 08/28/21 0316  WBC 12.3* 7.2 8.9 6.6  NEUTROABS 11.5*  --   --   --   HGB 13.1 11.8* 11.3* 10.3*  HCT 40.3 36.5* 34.6* 32.0*  MCV 96.9 96.8 97.2 97.0  PLT 279 273 291 970   Basic Metabolic Panel: Recent Labs  Lab 08/25/21 0943 08/26/21 0341 08/27/21 0301 08/28/21 0316  NA 137 144 141 142  K 3.5 3.2* 3.6 3.6  CL 106 103 105 104  CO2 26 28 27 29   GLUCOSE 106* 113* 124* 104*  BUN 25* 29* 25* 22  CREATININE 1.37* 1.19 1.14 0.99  CALCIUM 8.4* 8.4* 8.7* 8.5*  MG 1.8 2.0 2.2  --   PHOS  --  3.5 3.4  --    GFR: Estimated Creatinine Clearance: 71.8 mL/min (by C-G formula based on SCr of 0.99 mg/dL). Liver Function Tests: Recent Labs  Lab 08/25/21 0943 08/26/21 0341  AST 27 19  ALT 33 26  ALKPHOS  230* 185*  BILITOT 0.9 0.8  PROT 6.1* 5.7*  ALBUMIN 2.9* 2.5*   No results for input(s): LIPASE, AMYLASE in the last 168 hours. No results for input(s): AMMONIA in the last 168 hours. Coagulation Profile: No results for input(s): INR, PROTIME in the last 168 hours. Cardiac Enzymes: No results for input(s): CKTOTAL, CKMB, CKMBINDEX, TROPONINI in the last 168 hours. BNP (last 3 results) No results for input(s): PROBNP in the last 8760 hours. HbA1C: No results for input(s): HGBA1C in the last 72 hours. CBG: No results for input(s): GLUCAP in the last 168 hours. Lipid Profile: No results for input(s): CHOL, HDL, LDLCALC, TRIG, CHOLHDL, LDLDIRECT in the last 72 hours. Thyroid Function Tests: No results for input(s): TSH, T4TOTAL, FREET4, T3FREE, THYROIDAB in the last 72 hours. Anemia Panel: No results for input(s): VITAMINB12, FOLATE, FERRITIN, TIBC, IRON, RETICCTPCT in the last 72 hours. Sepsis Labs: Recent Labs  Lab 08/25/21 0947 08/25/21 1215  LATICACIDVEN 2.3* 2.1*    Recent Results (from the past 240 hour(s))  Resp Panel by RT-PCR (Flu A&B, Covid) Nasopharyngeal Swab  Status: None   Collection Time: 08/25/21  9:50 AM   Specimen: Nasopharyngeal Swab; Nasopharyngeal(NP) swabs in vial transport medium  Result Value Ref Range Status   SARS Coronavirus 2 by RT PCR NEGATIVE NEGATIVE Final    Comment: (NOTE) SARS-CoV-2 target nucleic acids are NOT DETECTED.  The SARS-CoV-2 RNA is generally detectable in upper respiratory specimens during the acute phase of infection. The lowest concentration of SARS-CoV-2 viral copies this assay can detect is 138 copies/mL. A negative result does not preclude SARS-Cov-2 infection and should not be used as the sole basis for treatment or other patient management decisions. A negative result may occur with  improper specimen collection/handling, submission of specimen other than nasopharyngeal swab, presence of viral mutation(s) within  the areas targeted by this assay, and inadequate number of viral copies(<138 copies/mL). A negative result must be combined with clinical observations, patient history, and epidemiological information. The expected result is Negative.  Fact Sheet for Patients:  EntrepreneurPulse.com.au  Fact Sheet for Healthcare Providers:  IncredibleEmployment.be  This test is no t yet approved or cleared by the [place] FDA and  has been authorized for detection and/or diagnosis of SARS-CoV-2 by FDA under an Emergency Use Authorization (EUA). This EUA will remain  in effect (meaning this test can be used) for the duration of the COVID-19 declaration under Section 564(b)(1) of the Act, 21 U.S.C.section 360bbb-3(b)(1), unless the authorization is terminated  or revoked sooner.       Influenza A by PCR NEGATIVE NEGATIVE Final   Influenza B by PCR NEGATIVE NEGATIVE Final    Comment: (NOTE) The Xpert Xpress SARS-CoV-2/FLU/RSV plus assay is intended as an aid in the diagnosis of influenza from Nasopharyngeal swab specimens and should not be used as a sole basis for treatment. Nasal washings and aspirates are unacceptable for Xpert Xpress SARS-CoV-2/FLU/RSV testing.  Fact Sheet for Patients: EntrepreneurPulse.com.au  Fact Sheet for Healthcare Providers: IncredibleEmployment.be  This test is not yet approved or cleared by the [place] FDA and has been authorized for detection and/or diagnosis of SARS-CoV-2 by FDA under an Emergency Use Authorization (EUA). This EUA will remain in effect (meaning this test can be used) for the duration of the COVID-19 declaration under Section 564(b)(1) of the Act, 21 U.S.C. section 360bbb-3(b)(1), unless the authorization is terminated or revoked.  Performed at [facility], [place] [address] [place], [place] [address]   Blood culture (routine x 2)     Status:  None (Preliminary result)   Collection Time: 08/25/21 12:55 PM   Specimen: BLOOD  Result Value Ref Range Status   Specimen Description   Final    BLOOD BLOOD RIGHT HAND Performed at [place] [address] [place], [place] [address]    Special Requests   Final    BOTTLES DRAWN AEROBIC AND ANAEROBIC Blood Culture results may not be optimal due to an inadequate volume of blood received in culture bottles Performed at [place] [address], [place], [place] [address]    Culture   Final    NO GROWTH 3 DAYS Performed at [facility], [place] [address], [place], [place] [address]    Report Status PENDING  Incomplete  Blood culture (routine x 2)     Status: None (Preliminary result)   Collection Time: 08/25/21  1:03 PM   Specimen: BLOOD  Result Value Ref Range Status   Specimen Description   Final    BLOOD BLOOD RIGHT FOREARM Performed  at [facility], [place] [address], [place], [place] [address]    Special Requests   Final    BOTTLES DRAWN AEROBIC AND ANAEROBIC Blood Culture adequate volume Performed at [place] [address] [place], [place] [address]    Culture   Final    NO GROWTH 3 DAYS Performed at [facility], [place] [address], [place], [place] [address]    Report Status PENDING  Incomplete  MRSA Next Gen by PCR, Nasal     Status: None   Collection Time: 08/26/21  5:02 PM   Specimen: Nasal Mucosa; Nasal Swab  Result Value Ref Range Status   MRSA by PCR Next Gen NOT DETECTED NOT DETECTED Final    Comment: (NOTE) The GeneXpert MRSA Assay (FDA approved for NASAL specimens only), is one component of a comprehensive MRSA colonization surveillance program. It is not intended to diagnose MRSA infection nor to guide or monitor treatment for MRSA infections. Test performance is not FDA approved in patients less than 11 years old. Performed at [facility], [place] [address], [place], [place] [address]     Radiology Studies: No results found.  Scheduled Meds:  apixaban  5 mg Oral BID   Chlorhexidine Gluconate Cloth  6 each Topical Daily   cycloSPORINE  1 drop Both Eyes Daily   dexamethasone (DECADRON) injection  4 mg Intravenous Q24H   multivitamin with minerals  1 tablet Oral Daily   sodium chloride flush  10-40 mL Intracatheter Q12H   tamsulosin  0.4 mg Oral QHS   Continuous Infusions:  piperacillin-tazobactam (ZOSYN)  IV Stopped (08/28/21 1120)     LOS: 3 days    Time spent: 35 mins   Ahan Eisenberger, MD [facility]   If 7PM-7AM, please contact night-coverage

## 2021-08-28 NOTE — Procedures (Signed)
Thoracentesis  Procedure Note  Ethan Brown  [id number]  1951/02/16  Date:08/28/21  Time:5:19 PM   Provider Performing:Pete E Kary Kos   Procedure: Thoracentesis with imaging guidance (58527)  Indication(s) Pleural Effusion  Consent Risks of the procedure as well as the alternatives and risks of each were explained to the patient and/or caregiver.  Consent for the procedure was obtained and is signed in the bedside chart  Anesthesia Topical only with 1% lidocaine    Time Out Verified patient identification, verified procedure, site/side was marked, verified correct patient position, special equipment/implants available, medications/allergies/relevant history reviewed, required imaging and test results available.   Sterile Technique Maximal sterile technique including full sterile barrier drape, hand hygiene, sterile gown, sterile gloves, mask, hair covering, sterile ultrasound probe cover (if used).  Procedure Description Ultrasound was used to identify appropriate pleural anatomy for placement and overlying skin marked.  Area of drainage cleaned and draped in sterile fashion. Lidocaine was used to anesthetize the skin and subcutaneous tissue.  1200 cc's of concentrated orange/blood tinged appearing fluid was drained from the left pleural space. Catheter then removed and bandaid applied to site.   Complications/Tolerance None; patient tolerated the procedure well. Chest X-ray is ordered to confirm no post-procedural complication.   EBL Minimal   Specimen(s) Pleural fluid  Erick Colace ACNP-BC [place] Pager # [phone] OR # [phone] if no answer

## 2021-08-28 NOTE — Progress Notes (Signed)
Nurse attempted an I&O catheter on the patient. Attempt was unsuccessful and resistance was met during insertion. Nurse notified provider Jeneen Rinks, NP of her findings during the attempt. No new orders at this time. Will continue to monitor.

## 2021-08-28 NOTE — Progress Notes (Signed)
Patient is sitting in chair with family at bedside, with Velcro belt in place and bed alarm.  Family member and patient given instructions on how to remove belt. Family member states that she understands, and staff watched patient physically remove Velcro straps.

## 2021-08-29 ENCOUNTER — Other Ambulatory Visit (HOSPITAL_COMMUNITY): Payer: Self-pay

## 2021-08-29 MED ORDER — IPRATROPIUM-ALBUTEROL 0.5-2.5 (3) MG/3ML IN SOLN
3.0000 mL | RESPIRATORY_TRACT | Status: DC | PRN
  Administered 2021-08-29 – 2021-08-30 (×2): 3 mL via RESPIRATORY_TRACT
  Filled 2021-08-29 (×2): qty 3

## 2021-08-29 MED ORDER — BUDESONIDE 0.25 MG/2ML IN SUSP
0.2500 mg | Freq: Two times a day (BID) | RESPIRATORY_TRACT | Status: DC
  Administered 2021-08-29 – 2021-08-30 (×2): 0.25 mg via RESPIRATORY_TRACT
  Filled 2021-08-29 (×2): qty 2

## 2021-08-29 MED ORDER — BUDESONIDE 0.25 MG/2ML IN SUSP: 0.2500 mg | Freq: Two times a day (BID) | RESPIRATORY_TRACT | Status: DC

## 2021-08-29 NOTE — Progress Notes (Signed)
PROGRESS NOTE    Ethan Brown  NLG:[id number] DOB: Jul 30, 1951 DOA: 08/25/2021 PCP: Laurey Morale, MD    Brief Narrative: This 71 years old male with PMH significant for hypertension, stage IV lung cancer with brain and bone metastasis, left detached vitreous humor, history of left LE DVT, hyperlipidemia, migraine headache, pseudophakia, transient global amnesia brought in the ED after his wife noticed that he was having confusion and weakness in the morning.  Patient also been coughing with pleuritic chest pain.  Patient was found to be hypotensive in ED requiring IV hydration.  Work-up in the ED venous duplex negative for DVT, chest x-ray shows bilateral pleural effusion with new areas of airspace opacity, CTA chest ruled out PE but shows bronchus intermedius with worsening aeration of the right base likely pneumonia superimposed on lung tumor.  Patient is admitted for possible postobstructive pneumonia and started on empiric antibiotics.  Patient continued to remain tachypneic, Pulmonology consulted, recommended prolonged course of IV antibiotics due to progression of the disease. Patient has poor prognosis. Palliative care consulted, goals of care discussed, CODE STATUS changed to DNR.  Patient and family they are more receptive about considering hospice.   Assessment & Plan:   Principal Problem:   Postobstructive pneumonia Active Problems:   Pleural effusion   Adenocarcinoma of right lung, stage 4 (HCC)   Deep venous thrombosis of distal vein of left lower extremity (HCC)   Elevated troponin   Pressure injury of skin   Malnutrition of moderate degree   Acute delirium   S/P thoracentesis  Post obstructive pneumonia: Patient with history of stage IV lung cancer presented with cough, SOB, worsening confusion. Continue supplemental oxygen to keep saturation above 94%. Continue bronchodilators as needed. Continue Zosyn for possible aspiration pneumonia. Continue antitussive as  needed. Pulmonology consulted,  recommended longer course of antibiotics. Patient underwent therapeutic thoracocentesis on Left yesterday,  tolerated well,  seems much improved.  Malignant Pleural effusion: Case discussed with CTS on-call. There is not adequate fluid for placement of pleural catheter. Patient is not a good surgical candidate. Patient underwent left thoracocentesis on 1/5, tolerated well.  Adenocarcinoma of right lung stage IV: Patient follows up with Dr. Julien Nordmann as outpatient. Follow-up radiation oncology as scheduled. Given advanced disease,  long-term prognosis is poor. Dr. Julien Nordmann has long discussion with family and family is receptive about considering hospice.  Deep venous thrombosis of left lower extremity: Continue Eliquis.  Elevated troponin Likely due to demand ischemia.   Patient denies any chest pain,  no significant uptrend.  DVT prophylaxis: Eliquis Code Status: Full code. Family Communication: Wife at bedside Disposition Plan:   Status is: Inpatient  Remains inpatient appropriate because:  Admitted for postobstructive pneumonia requiring IV antibiotics.  Palliative care consulted to discuss goals of care,  family is receptive about considering hospice.   Consultants:  Pulmonology Palliative care  Procedures: CTA chest Antimicrobials:   Anti-infectives (From admission, onward)    Start     Dose/Rate Route Frequency Ordered Stop   08/25/21 2000  piperacillin-tazobactam (ZOSYN) IVPB 3.375 g        3.375 g 12.5 mL/hr over 240 Minutes Intravenous Every 8 hours 08/25/21 1529     08/25/21 1245  piperacillin-tazobactam (ZOSYN) IVPB 3.375 g        3.375 g 100 mL/hr over 30 Minutes Intravenous  Once 08/25/21 1239 08/25/21 2010        Subjective: Patient was seen and examined at bedside.  Overnight events noted.   Patient  reports feeling much improved.  He underwent left thoracocentesis yesterday tolerated well. He appears much comfortable,  states cough is improved as well. Goals of care discussed in detail.  Patient has poor prognosis.     Objective: Vitals:   08/29/21 0600 08/29/21 0800 08/29/21 0900 08/29/21 1000  BP: (!) 155/85 131/72 (!) 112/58 122/64  Pulse: 99 92 93 89  Resp: (!) 21 16 (!) 29 (!) 21  Temp:  97.7 F (36.5 C)    TempSrc:  Oral    SpO2: 97% 100% 100% 100%  Weight:      Height:        Intake/Output Summary (Last 24 hours) at 08/29/2021 1207 Last data filed at 08/29/2021 0800 Gross per 24 hour  Intake 745.77 ml  Output 200 ml  Net 545.77 ml   Filed Weights   08/26/21 1844  Weight: 75.5 kg    Examination:  General exam: Appears comfortable, not in any acute distress, chronically ill looking. Respiratory system: Clear to auscultation bilaterally, respiratory effort normal.  RR 15 Cardiovascular system: S1-S2 heard, regular rate and rhythm, no murmur. Gastrointestinal system: Abdomen is soft, nontender, nondistended, BS +. Central nervous system: Alert and oriented x 2. No focal neurological deficits. Extremities: +Pedal edema, no cyanosis, no clubbing. Skin: No rashes, lesions or ulcers Psychiatry: Mood & affect appropriate.     Data Reviewed: I have personally reviewed following labs and imaging studies  CBC: Recent Labs  Lab 08/25/21 0943 08/26/21 0341 08/27/21 0301 08/28/21 0316  WBC 12.3* 7.2 8.9 6.6  NEUTROABS 11.5*  --   --   --   HGB 13.1 11.8* 11.3* 10.3*  HCT 40.3 36.5* 34.6* 32.0*  MCV 96.9 96.8 97.2 97.0  PLT 279 273 291 947   Basic Metabolic Panel: Recent Labs  Lab 08/25/21 0943 08/26/21 0341 08/27/21 0301 08/28/21 0316  NA 137 144 141 142  K 3.5 3.2* 3.6 3.6  CL 106 103 105 104  CO2 26 28 27 29   GLUCOSE 106* 113* 124* 104*  BUN 25* 29* 25* 22  CREATININE 1.37* 1.19 1.14 0.99  CALCIUM 8.4* 8.4* 8.7* 8.5*  MG 1.8 2.0 2.2  --   PHOS  --  3.5 3.4  --    GFR: Estimated Creatinine Clearance: 71.8 mL/min (by C-G formula based on SCr of 0.99 mg/dL). Liver  Function Tests: Recent Labs  Lab 08/25/21 0943 08/26/21 0341  AST 27 19  ALT 33 26  ALKPHOS 230* 185*  BILITOT 0.9 0.8  PROT 6.1* 5.7*  ALBUMIN 2.9* 2.5*   No results for input(s): LIPASE, AMYLASE in the last 168 hours. No results for input(s): AMMONIA in the last 168 hours. Coagulation Profile: No results for input(s): INR, PROTIME in the last 168 hours. Cardiac Enzymes: No results for input(s): CKTOTAL, CKMB, CKMBINDEX, TROPONINI in the last 168 hours. BNP (last 3 results) No results for input(s): PROBNP in the last 8760 hours. HbA1C: No results for input(s): HGBA1C in the last 72 hours. CBG: No results for input(s): GLUCAP in the last 168 hours. Lipid Profile: No results for input(s): CHOL, HDL, LDLCALC, TRIG, CHOLHDL, LDLDIRECT in the last 72 hours. Thyroid Function Tests: No results for input(s): TSH, T4TOTAL, FREET4, T3FREE, THYROIDAB in the last 72 hours. Anemia Panel: No results for input(s): VITAMINB12, FOLATE, FERRITIN, TIBC, IRON, RETICCTPCT in the last 72 hours. Sepsis Labs: Recent Labs  Lab 08/25/21 0947 08/25/21 1215  LATICACIDVEN 2.3* 2.1*    Recent Results (from the past 240 hour(s))  Resp  Panel by RT-PCR (Flu A&B, Covid) Nasopharyngeal Swab     Status: None   Collection Time: 08/25/21  9:50 AM   Specimen: Nasopharyngeal Swab; Nasopharyngeal(NP) swabs in vial transport medium  Result Value Ref Range Status   SARS Coronavirus 2 by RT PCR NEGATIVE NEGATIVE Final    Comment: (NOTE) SARS-CoV-2 target nucleic acids are NOT DETECTED.  The SARS-CoV-2 RNA is generally detectable in upper respiratory specimens during the acute phase of infection. The lowest concentration of SARS-CoV-2 viral copies this assay can detect is 138 copies/mL. A negative result does not preclude SARS-Cov-2 infection and should not be used as the sole basis for treatment or other patient management decisions. A negative result may occur with  improper specimen collection/handling,  submission of specimen other than nasopharyngeal swab, presence of viral mutation(s) within the areas targeted by this assay, and inadequate number of viral copies(<138 copies/mL). A negative result must be combined with clinical observations, patient history, and epidemiological information. The expected result is Negative.  Fact Sheet for Patients:  EntrepreneurPulse.com.au  Fact Sheet for Healthcare Providers:  IncredibleEmployment.be  This test is no t yet approved or cleared by the [place] FDA and  has been authorized for detection and/or diagnosis of SARS-CoV-2 by FDA under an Emergency Use Authorization (EUA). This EUA will remain  in effect (meaning this test can be used) for the duration of the COVID-19 declaration under Section 564(b)(1) of the Act, 21 U.S.C.section 360bbb-3(b)(1), unless the authorization is terminated  or revoked sooner.       Influenza A by PCR NEGATIVE NEGATIVE Final   Influenza B by PCR NEGATIVE NEGATIVE Final    Comment: (NOTE) The Xpert Xpress SARS-CoV-2/FLU/RSV plus assay is intended as an aid in the diagnosis of influenza from Nasopharyngeal swab specimens and should not be used as a sole basis for treatment. Nasal washings and aspirates are unacceptable for Xpert Xpress SARS-CoV-2/FLU/RSV testing.  Fact Sheet for Patients: EntrepreneurPulse.com.au  Fact Sheet for Healthcare Providers: IncredibleEmployment.be  This test is not yet approved or cleared by the [place] FDA and has been authorized for detection and/or diagnosis of SARS-CoV-2 by FDA under an Emergency Use Authorization (EUA). This EUA will remain in effect (meaning this test can be used) for the duration of the COVID-19 declaration under Section 564(b)(1) of the Act, 21 U.S.C. section 360bbb-3(b)(1), unless the authorization is terminated or revoked.  Performed at [facility], [place] [address] [place], [place] [address]   Blood culture (routine x 2)     Status: None (Preliminary result)   Collection Time: 08/25/21 12:55 PM   Specimen: BLOOD  Result Value Ref Range Status   Specimen Description   Final    BLOOD BLOOD RIGHT HAND Performed at [facility] [address], [place], [place] [address]    Special Requests   Final    BOTTLES DRAWN AEROBIC AND ANAEROBIC Blood Culture results may not be optimal due to an inadequate volume of blood received in culture bottles Performed at [facility] [address], [place], [place] [address]    Culture   Final    NO GROWTH 4 DAYS Performed at [facility], [place] [address], [place], [place] [address]    Report Status PENDING  Incomplete  Blood culture (routine x 2)     Status: None (Preliminary result)   Collection Time: 08/25/21  1:03 PM   Specimen: BLOOD  Result Value Ref Range Status   Specimen  Description   Final    BLOOD BLOOD RIGHT FOREARM Performed at [facility] [address], [place], [place] [address]    Special Requests   Final    BOTTLES DRAWN AEROBIC AND ANAEROBIC Blood Culture adequate volume Performed at [facility] [address], [place], [place] [address]    Culture   Final    NO GROWTH 4 DAYS Performed at [facility], [place] [address], [place], [place] [address]    Report Status PENDING  Incomplete  MRSA Next Gen by PCR, Nasal     Status: None   Collection Time: 08/26/21  5:02 PM   Specimen: Nasal Mucosa; Nasal Swab  Result Value Ref Range Status   MRSA by PCR Next Gen NOT DETECTED NOT DETECTED Final    Comment: (NOTE) The GeneXpert MRSA Assay (FDA approved for NASAL specimens only), is one component of a comprehensive MRSA colonization surveillance program. It is not intended to diagnose MRSA infection nor to guide or monitor treatment for MRSA infections. Test performance is not  FDA approved in patients less than 72 years old. Performed at [facility], [place] [address], [place], [place] [address]     Radiology Studies: DG Chest Port 1 View  Result Date: 08/28/2021 CLINICAL DATA:  Thoracentesis, history of right lung cancer EXAM: PORTABLE CHEST 1 VIEW COMPARISON:  08/25/2021 FINDINGS: Single frontal view of the chest demonstrates stable right chest wall port. Chronic consolidation at the right lung base consistent with known lung cancer. Stable volume loss. Persistent bilateral airspace disease, right greater than left. Decreased right pleural effusion since prior study. No pneumothorax. IMPRESSION: 1. Right basilar consolidation consistent with known lung cancer. 2. Bilateral airspace disease, right greater than left, stable. 3. Decreased right pleural effusion. Electronically Signed   By: Randa Ngo M.D.   On: 08/28/2021 18:24    Scheduled Meds:  apixaban  5 mg Oral BID   Chlorhexidine Gluconate Cloth  6 each Topical Daily   cycloSPORINE  1 drop Both Eyes Daily   dexamethasone (DECADRON) injection  4 mg Intravenous Q24H   multivitamin with minerals  1 tablet Oral Daily   sodium chloride flush  10-40 mL Intracatheter Q12H   tamsulosin  0.4 mg Oral QHS   Continuous Infusions:  piperacillin-tazobactam (ZOSYN)  IV Stopped (08/29/21 0940)     LOS: 4 days    Time spent: 35 mins   Annisha Baar, MD [facility]   If 7PM-7AM, please contact night-coverage

## 2021-08-29 NOTE — Progress Notes (Signed)
Patient just needs posey at night when wife isn't with him.  Order expires at [facility] 08/29/21

## 2021-08-30 ENCOUNTER — Telehealth: Payer: Self-pay | Admitting: Pulmonary Disease

## 2021-08-30 DIAGNOSIS — Z515 Encounter for palliative care: Secondary | ICD-10-CM

## 2021-08-30 LAB — CBC
HCT: 33.3 % — ABNORMAL LOW (ref 39.0–52.0)
Hemoglobin: 10.6 g/dL — ABNORMAL LOW (ref 13.0–17.0)
MCH: 30.9 pg (ref 26.0–34.0)
MCHC: 31.8 g/dL (ref 30.0–36.0)
MCV: 97.1 fL (ref 80.0–100.0)
Platelets: 299 10*3/uL (ref 150–400)
RBC: 3.43 MIL/uL — ABNORMAL LOW (ref 4.22–5.81)
RDW: 14.7 % (ref 11.5–15.5)
WBC: 9.6 10*3/uL (ref 4.0–10.5)
nRBC: 0 % (ref 0.0–0.2)

## 2021-08-30 LAB — CULTURE, BLOOD (ROUTINE X 2)
Culture: NO GROWTH
Culture: NO GROWTH
Special Requests: ADEQUATE

## 2021-08-30 MED ORDER — GLYCOPYRROLATE 0.2 MG/ML IJ SOLN
0.2000 mg | Freq: Once | INTRAMUSCULAR | Status: AC
  Administered 2021-08-30: 0.2 mg via INTRAVENOUS
  Filled 2021-08-30: qty 1

## 2021-08-30 MED ORDER — BISACODYL 5 MG PO TBEC: 5.0000 mg | DELAYED_RELEASE_TABLET | Freq: Every day | ORAL | 0 refills | Status: AC | PRN

## 2021-08-30 MED ORDER — MORPHINE SULFATE (CONCENTRATE) 20 MG/ML PO SOLN: 10.0000 mg | ORAL | 0 refills | Status: AC | PRN

## 2021-08-30 MED ORDER — BUDESONIDE 0.25 MG/2ML IN SUSP: 0.2500 mg | Freq: Two times a day (BID) | RESPIRATORY_TRACT | 3 refills | Status: AC

## 2021-08-30 MED ORDER — DEXAMETHASONE 4 MG PO TABS: 4.0000 mg | ORAL_TABLET | Freq: Every day | ORAL | 3 refills | Status: AC

## 2021-08-30 MED ORDER — IPRATROPIUM-ALBUTEROL 0.5-2.5 (3) MG/3ML IN SOLN: 3.0000 mL | RESPIRATORY_TRACT | 3 refills | Status: AC | PRN

## 2021-08-30 MED ORDER — BENZONATATE 100 MG PO CAPS: 400.0000 mg | ORAL_CAPSULE | Freq: Once | ORAL | Status: DC

## 2021-08-30 MED ORDER — HEPARIN SOD (PORK) LOCK FLUSH 100 UNIT/ML IV SOLN
500.0000 [IU] | INTRAVENOUS | Status: AC | PRN
  Administered 2021-08-30: 500 [IU]
  Filled 2021-08-30: qty 5

## 2021-08-30 MED ORDER — DIPHENHYDRAMINE HCL 50 MG/ML IJ SOLN
12.5000 mg | Freq: Once | INTRAMUSCULAR | Status: AC
  Administered 2021-08-30: 12.5 mg via INTRAVENOUS
  Filled 2021-08-30: qty 1

## 2021-08-30 MED ORDER — AMOXICILLIN-POT CLAVULANATE 875-125 MG PO TABS: 1.0000 | ORAL_TABLET | Freq: Two times a day (BID) | ORAL | 0 refills | Status: AC

## 2021-08-30 MED ORDER — MORPHINE SULFATE (PF) 2 MG/ML IV SOLN
1.0000 mg | INTRAVENOUS | Status: DC | PRN
  Administered 2021-08-30: 1 mg via INTRAVENOUS
  Filled 2021-08-30: qty 1

## 2021-08-30 MED ORDER — GLYCOPYRROLATE 0.2 MG/ML IJ SOLN: 0.2000 mg | INTRAMUSCULAR | Status: DC | PRN

## 2021-08-30 MED ORDER — SODIUM CHLORIDE 0.9 % IN NEBU
3.0000 mL | INHALATION_SOLUTION | Freq: Three times a day (TID) | RESPIRATORY_TRACT | Status: DC | PRN
  Filled 2021-08-30: qty 3

## 2021-08-30 MED ORDER — DEXAMETHASONE 4 MG PO TABS: 4.0000 mg | ORAL_TABLET | Freq: Every day | ORAL | 0 refills | Status: AC

## 2021-08-30 NOTE — Progress Notes (Addendum)
[facility])  Referral received for hospice services at home.  Met with patient and wife, provided support and answered questions.  Plans to d/c later today once DME is in place.  Bed, bedside table, 3n1, O2, wheelchair ordered and will be delivered by 1-2 pm.  Necessary comfort medications have been arranged.  Once [facility] manager discusses discharge time with [name], transport can be arranged.  He will need ambulance transport to take him home.  PAC may be de-accessed. Please leave urinary incontinent device in place.  Venia Carbon RN, BSN, [facility] Liaison

## 2021-08-30 NOTE — Progress Notes (Signed)
PCCM:  I was called by palliative care to consider pleurex catheter placement.   Patient is on eliquis and unfortunately cannot be placed today.  However, bedside US complete and he has not had rapid re-accumulation. There is only a small amount of fluid within the left chest that is free flowing. Approximately 1.5cm in the left under ultrasound. The right chest has very complex appearing pleural disease and I do not think intervention is warranted at this time.   We will plan for outpatient pleurex catheter placement next week, hopefully on Friday morning. I will place orders. Hopefully he can transition home with hospice this weekend. If he changes clinically between now and then we can address as needed.   [facility] 08/30/2021 8:08 AM

## 2021-08-30 NOTE — Progress Notes (Signed)
Pt discharged to home with hospice via EMS.  IV access removed prior to transport

## 2021-08-30 NOTE — Discharge Summary (Signed)
Physician Discharge Summary  Ethan Brown ZOX:[id number] DOB: [birth year] DOA: 08/25/2021  PCP: Laurey Morale, MD  Admit date: 08/25/2021  Discharge date: 08/30/2021  Admitted From: Home.  Disposition:  Home hospice.  Recommendations for Outpatient Follow-up:  Follow up with PCP in 1-2 weeks. Please obtain BMP/CBC in one week. Advised to follow-up with pulmonology Dr. Valeta Harms in 1 week for Pleurx catheter for hospice. Advised to hold Eliquis for anticipated Pleurx catheter on Friday. Patient is being discharged home with home hospice.  Home Health: Home hospice Equipment/Devices: Oxygen @ 4l/Min  Discharge Condition: Stable CODE STATUS:DNR Diet recommendation: Regular diet  Brief Summary/ Hospital Course: This 71 years old male with PMH significant for hypertension, stage IV lung cancer with brain and bone metastasis, left detached vitreous humor, history of left LE DVT, hyperlipidemia, migraine headache, pseudophakia, transient global amnesia brought in the ED after his wife noticed that he was having confusion and weakness in the morning.  Patient also been coughing with pleuritic chest pain.  Patient was found to be hypotensive in ED requiring IV hydration.  Work-up in the ED venous duplex negative for DVT, chest x-ray shows bilateral pleural effusion with new areas of airspace opacity, CTA chest ruled out PE but shows bronchus intermedius with worsening aeration of the right base likely pneumonia superimposed on lung tumor.   Patient was admitted for possible postobstructive pneumonia and started on empiric antibiotics.  Patient continued to remain tachypneic, Pulmonology consulted, recommended prolonged course of antibiotics due to progression of the disease.  Patient continued to remain tachypneic, recommended to have left thoracocentesis. 1200cc of bloody fluid drained.  Patient felt significantly improved, given advanced and progressive disease palliative care was consulted to  discuss goals of care. Patient has poor prognosis. CODE STATUS changed to DNR.  Patient and family they are more receptive about considering hospice. Patient will eventually need Pleurx catheter for hospice /recurrent malignant effusion.  Since patient is taking Eliquis so it can be done once Eliquis wears off. Pulmonologist and IR has evaluated the patient is scheduled for Pleurx catheter on next Friday.  Home health services arranged.  Patient is being discharged home with home hospice.  He was managed for below problems.  Discharge Diagnoses:  Principal Problem:   Postobstructive pneumonia Active Problems:   Pleural effusion   Adenocarcinoma of right lung, stage 4 (HCC)   Deep venous thrombosis of distal vein of left lower extremity (HCC)   Elevated troponin   Pressure injury of skin   Malnutrition of moderate degree   Acute delirium   S/P thoracentesis   Hospice care patient  Post obstructive pneumonia: Patient with history of stage IV lung cancer presented with cough, SOB, worsening confusion. Continue supplemental oxygen to keep saturation above 94%. Continue bronchodilators as needed. Continued Zosyn for possible aspiration pneumonia. Continue antitussive as needed. Pulmonology consulted,  recommended longer course of antibiotics. Patient underwent therapeutic thoracocentesis on Left yesterday,  tolerated well,  seems much improved. Antibiotics changed to Augmentin for next 3 weeks (recommended 4 weeks of antibiotics.)   Malignant Pleural effusion: Case discussed with CTS on-call. There is not adequate fluid for placement of pleural catheter. Patient is not a good surgical candidate. Patient underwent left thoracocentesis on 1/5, tolerated well. Patient is a scheduled to have Pleurx catheter on next Friday.   Adenocarcinoma of right lung stage IV: Patient follows up with Dr. Julien Nordmann as outpatient. Follow-up radiation oncology as scheduled. Given advanced disease,   long-term prognosis is poor. Dr. Julien Nordmann  has long discussion with family and family is receptive about considering hospice.   Deep venous thrombosis of left lower extremity: Hold Eliquis for anticipated Pleurx catheter on Friday   Elevated troponin Likely due to demand ischemia.   Patient denies any chest pain,  no significant uptrend  Discharge Instructions  Discharge Instructions     Call MD for:  difficulty breathing, headache or visual disturbances   Complete by: As directed    Call MD for:  persistant dizziness or light-headedness   Complete by: As directed    Call MD for:  persistant nausea and vomiting   Complete by: As directed    Diet - low sodium heart healthy   Complete by: As directed    Diet clear liquid   Complete by: As directed    Discharge wound care:   Complete by: As directed    Follow up outpatient.   Increase activity slowly   Complete by: As directed       Allergies as of 08/30/2021   No Known Allergies      Medication List     STOP taking these medications    apixaban 5 MG Tabs tablet Commonly known as: ELIQUIS   Gilotrif 30 MG tablet Generic drug: afatinib dimaleate   hydrochlorothiazide 12.5 MG capsule Commonly known as: MICROZIDE   multivitamin with minerals tablet   temazepam 15 MG capsule Commonly known as: RESTORIL       TAKE these medications    amoxicillin-clavulanate 875-125 MG tablet Commonly known as: Augmentin Take 1 tablet by mouth 2 (two) times daily for 21 days.   bisacodyl 5 MG EC tablet Commonly known as: DULCOLAX Take 1 tablet (5 mg total) by mouth daily as needed for moderate constipation.   budesonide 0.25 MG/2ML nebulizer solution Commonly known as: PULMICORT Take 2 mLs (0.25 mg total) by nebulization 2 (two) times daily.   cycloSPORINE 0.05 % ophthalmic emulsion Commonly known as: RESTASIS Place 1 drop into both eyes in the morning.   dexamethasone 4 MG tablet Commonly known as: Decadron Take 1  tablet (4 mg total) by mouth daily.   dexamethasone 4 MG tablet Commonly known as: Decadron Take 1 tablet (4 mg total) by mouth daily.   diphenhydrAMINE 25 MG tablet Commonly known as: BENADRYL Take 50 mg by mouth at bedtime as needed for sleep.   furosemide 20 MG tablet Commonly known as: LASIX TAKE 2TABS IN THE MORNING AND 1 TAB IN THE EVENING What changed:  how much to take how to take this when to take this   HYDROcodone bit-homatropine 5-1.5 MG/5ML syrup Commonly known as: Hycodan Take 5 mLs by mouth every 6 (six) hours as needed for cough.   ipratropium-albuterol 0.5-2.5 (3) MG/3ML Soln Commonly known as: DUONEB Take 3 mLs by nebulization every 4 (four) hours as needed.   LORazepam 1 MG tablet Commonly known as: ATIVAN Take 1 mg by mouth at bedtime as needed for anxiety or sleep.   magic mouthwash Soln Take 5 mLs by mouth 4 (four) times daily as needed for mouth pain. Swish and spit or swallow   morphine 20 MG/ML concentrated solution Commonly known as: ROXANOL Place 0.5-1 mLs (10-20 mg total) under the tongue every 2 (two) hours as needed for shortness of breath or moderate pain.   potassium chloride 10 MEQ tablet Commonly known as: KLOR-CON TAKE 1 TABLET BY MOUTH EVERY DAY What changed: how to take this   prochlorperazine 10 MG tablet Commonly known as: COMPAZINE Take  1 tablet (10 mg total) by mouth every 6 (six) hours as needed for nausea or vomiting.   tamsulosin 0.4 MG Caps capsule Commonly known as: FLOMAX Take 0.4 mg by mouth at bedtime.               Durable Medical Equipment  (From admission, onward)           Start     Ordered   08/30/21 1030  For home use only DME Hospital bed  Once       Question Answer Comment  Length of Need Lifetime   Patient has (list medical condition): Stage IV lung cancer with malignant pleural effusion.   The above medical condition requires: Patient requires the ability to reposition frequently   Bed  type Semi-electric   Support Surface: Gel Overlay      08/30/21 1029   08/30/21 1029  For home use only DME oxygen  Once       Question Answer Comment  Length of Need Lifetime   Mode or (Route) Nasal cannula   Liters per Minute 4   Frequency Continuous (stationary and portable oxygen unit needed)   Oxygen conserving device Yes   Oxygen delivery system Gas      08/30/21 1028              Discharge Care Instructions  (From admission, onward)           Start     Ordered   08/30/21 0000  Discharge wound care:       Comments: Follow up outpatient.   08/30/21 1021            Follow-up Information     Laurey Morale, MD Follow up in 1 week(s).   Specialty: Family Medicine Contact information: [place] [address] [phone]         Leonie Man, MD .   Specialty: Cardiology Contact information: [address] [place] [address] [place] [address] [phone]         June Leap L, DO Follow up in 1 week(s).   Specialty: Pulmonary Disease Contact information: [address] [place] [address] [phone]         Acquanetta Chain, DO. Call on 08/30/2021.   Specialty: Internal Medicine Why: As needed Contact information: [address] [address] [phone]                No Known Allergies  Consultations: Pulmonology Palliative care Oncology   Procedures/Studies: DG Chest 2 View  Result Date: 08/06/2021 CLINICAL DATA:  71 year old male preoperative chest x-ray EXAM: CHEST - 2 VIEW COMPARISON:  07/16/2021 FINDINGS: Cardiomediastinal silhouette unchanged in size and contour. Unchanged position of right chest wall subclavian approach port catheter. Opacity at the right lung base with meniscus obscuring the right hemidiaphragm and the right heart border, similar to the prior. Reticular opacities of the right lung, similar distribution of the prior and somewhat  improved. Blunting at the left costophrenic angle. Meniscus in the costophrenic sulcus on the lateral view. Thickening of the minor fissure. No pneumothorax. Degenerative changes of the spine.  No acute displaced fracture. IMPRESSION: Right-sided pleural effusion with associated atelectasis/consolidation, and evidence of lymphatic disease. Unchanged right chest wall port catheter. Trace left-sided pleural effusion Electronically Signed   By: Corrie Mckusick D.O.   On: 08/06/2021 14:09   CT Angio Chest PE W and/or Wo Contrast  Result Date: 08/25/2021 CLINICAL DATA:  Pulmonary embolism. History of lung cancer. Hypoxia EXAM: CT ANGIOGRAPHY CHEST WITH CONTRAST TECHNIQUE: Multidetector CT imaging of the chest was performed using the standard protocol during bolus administration of intravenous contrast. Multiplanar CT image reconstructions and MIPs were obtained to evaluate the vascular anatomy. CONTRAST:  35mL OMNIPAQUE IOHEXOL 350 MG/ML SOLN COMPARISON:  06/23/2021 FINDINGS: Cardiovascular: Satisfactory opacification of the pulmonary arteries to the segmental level. No evidence of pulmonary embolism. Normal heart size. No pericardial effusion. Porta catheter in place. Mediastinum/Nodes: Obstructed bronchus intermedius with central low-density appearance attributed to debris. Study is timed for arterial enhancement rather than parenchymal enhancement, cannot exclude superimposed airway tumor/infiltration. Patulous upper esophagus. No clear esophageal wall thickening Lungs/Pleura: Worsening aeration on the right where there is diffuse opacification, ground-glass density, and reticulonodular appearance best seen at the pleura and apical aerated lung. Large left pleural effusion. Moderate right pleural effusion loculated at the base with pleural thickening. Upper Abdomen: No acute finding Musculoskeletal: Extensive sclerotic metastatic disease involving ribs and spine. Review of the MIP images confirms the above findings.  IMPRESSION: 1. Debris obstructs the bronchus intermedius with worsening aeration at the right base that is likely pneumonia superimposed on pre-existing tumor related opacity. 2. Negative for pulmonary embolism. 3. Continued large left and moderate but complex right pleural effusion. Electronically Signed   By: Jorje Guild M.D.   On: 08/25/2021 11:19   DG Chest Port 1 View  Result Date: 08/28/2021 CLINICAL DATA:  Thoracentesis, history of right lung cancer EXAM: PORTABLE CHEST 1 VIEW COMPARISON:  08/25/2021 FINDINGS: Single frontal view of the chest demonstrates stable right chest wall port. Chronic consolidation at the right lung base consistent with known lung cancer. Stable volume loss. Persistent bilateral airspace disease, right greater than left. Decreased right pleural effusion since prior study. No pneumothorax. IMPRESSION: 1. Right basilar consolidation consistent with known lung cancer. 2. Bilateral airspace disease, right greater than left, stable. 3. Decreased right pleural effusion. Electronically Signed   By: Randa Ngo M.D.   On: 08/28/2021 18:24   DG Chest Portable 1 View  Result Date: 08/25/2021 CLINICAL DATA:  Short of breath.  Hypoxia.  Known lung carcinoma. EXAM: PORTABLE CHEST 1 VIEW COMPARISON:  08/05/2021 and older studies.  CT, 06/23/2021. FINDINGS: Hazy lung opacity lies in the left peripheral mid lung, increased from the prior exam. There is also hazy opacity at the left lung base that has increased. Small left effusion. Right pleural effusion, lung base opacity and interstitial thickening is stable. There is hazy airspace opacity in the right mid to upper lung that is new. Volume loss on the right is stable. No pneumothorax. Stable right anterior chest wall Port-A-Cath. IMPRESSION: 1. Interval worsening of lung aeration. 2. Hazy areas of airspace opacity have developed in the mid and lower lungs, on the right superimposed on the chronic changes related to lung carcinoma.  Suspect multifocal infection or inflammation as the etiology. 3. Bilateral pleural effusions without convincing change. Electronically Signed   By: Lajean Manes M.D.   On: 08/25/2021 10:37   VAS [place] LOWER EXTREMITY VENOUS (DVT) (ONLY [facility] & [facility])  Result Date: 08/25/2021  Lower Venous DVT Study Patient Name:  RAIDYN WASSINK [facility]  Date of Exam:   08/25/2021 Medical Rec #: [id number]              Accession #:    [id number] Date of Birth: 05-Oct-1950               Patient Gender: M Patient Age:  70 years Exam Location:  [facility] Procedure:      VAS [place] LOWER EXTREMITY VENOUS (DVT) Referring Phys: Marda Stalker --------------------------------------------------------------------------------  Indications: Swelling.  Risk Factors: Cancer. Limitations: Poor ultrasound/tissue interface. Comparison Study: 03/28/2021 - RIGHT:                   - No evidence of common femoral vein obstruction.                    LEFT:                   - Findings consistent with acute deep vein thrombosis                   involving the left                   common femoral vein, SF junction, left femoral vein, left                   proximal                   profunda vein, left popliteal vein, left posterior tibial                   veins, left                   peroneal veins, and left gastrocnemius                   veins.                   - Findings consistent with acute superficial vein thrombosis                   involving the                   left small saphenous vein.                   - No cystic structure found in the popliteal fossa.                    06/05/2021 - RIGHT:                   - No evidence of common femoral vein obstruction.                    LEFT:                   - Findings consistent with age indeterminate deep vein                   thrombosis                   involving the left femoral vein, left proximal profunda vein,                   left                   popliteal vein, left posterior  tibial veins, and left peroneal                   veins. When                   compared to prior study, there is resolution  of the DVT seen at the level of the CFV/SFJ, however the                   remaining                   segments of the left lower extremity demonstrate no                   significant change.                   - No cystic structure found in the popliteal fossa. Performing Technologist: Oliver Hum RVT  Examination Guidelines: A complete evaluation includes B-mode imaging, spectral Doppler, color Doppler, and power Doppler as needed of all accessible portions of each vessel. Bilateral testing is considered an integral part of a complete examination. Limited examinations for reoccurring indications may be performed as noted. The reflux portion of the exam is performed with the patient in reverse Trendelenburg.  +-----+---------------+---------+-----------+----------+--------------+  RIGHT Compressibility Phasicity Spontaneity Properties Thrombus Aging  +-----+---------------+---------+-----------+----------+--------------+  CFV   Full            Yes       Yes                                    +-----+---------------+---------+-----------+----------+--------------+   +---------+---------------+---------+-----------+----------+-------------------+  LEFT      Compressibility Phasicity Spontaneity Properties Thrombus Aging       +---------+---------------+---------+-----------+----------+-------------------+  CFV       Full            Yes       Yes                                         +---------+---------------+---------+-----------+----------+-------------------+  SFJ       Full            No        No                     Chronic              +---------+---------------+---------+-----------+----------+-------------------+  FV Prox   None            No        No                     Chronic               +---------+---------------+---------+-----------+----------+-------------------+  FV Mid    None            No        No                     Chronic              +---------+---------------+---------+-----------+----------+-------------------+  FV Distal None            No        No                     Chronic              +---------+---------------+---------+-----------+----------+-------------------+  PFV       Partial         Yes  Yes                    Chronic              +---------+---------------+---------+-----------+----------+-------------------+  POP       None            No        No                     Chronic              +---------+---------------+---------+-----------+----------+-------------------+  PTV       None                                             Chronic              +---------+---------------+---------+-----------+----------+-------------------+  PERO                                                       Not well visualized  +---------+---------------+---------+-----------+----------+-------------------+  Gastroc   None                                             Chronic              +---------+---------------+---------+-----------+----------+-------------------+  SSV       None                                             Chronic              +---------+---------------+---------+-----------+----------+-------------------+     Summary: RIGHT: - No evidence of common femoral vein obstruction.  LEFT: - Findings consistent with chronic deep vein thrombosis involving the left femoral vein, left proximal profunda vein, left popliteal vein, left posterior tibial veins, and left gastrocnemius veins. - Findings consistent with chronic superficial vein thrombosis involving the left small saphenous vein. - No cystic structure found in the popliteal fossa.  *See table(s) above for measurements and observations. Electronically signed by Monica Martinez MD on 08/25/2021 at 12:20:31 PM.    Final     Left thoracocentesis.   Subjective: Patient was seen and examined at bedside.  Overnight events noted.   Patient seems comfortable on 4 L of supplemental oxygen.   Patient feels better and patient is being discharged home home,  health services with home hospice arranged.  Discharge Exam: Vitals:   08/30/21 0903 08/30/21 1219  BP:    Pulse:    Resp:    Temp:  97.9 F (36.6 C)  SpO2: 95%    Vitals:   08/30/21 0802 08/30/21 0850 08/30/21 0903 08/30/21 1219  BP: (!) 144/101     Pulse: 100     Resp: (!) 28     Temp:  98 F (36.7 C)  97.9 F (36.6 C)  TempSrc:  Oral  Axillary  SpO2: (!) 86%  95%   Weight:      Height:  General: Alert, oriented x1, comfortable not in any acute distress, chronically ill looking. Cardiovascular: S1-S2 heard, regular rate and rhythm, no murmur Respiratory: Decreased breath sounds, no crackles, no wheezing.  No accessory muscle use Abdominal: Soft, NT, ND, bowel sounds + Extremities: no edema, no cyanosis    The results of significant diagnostics from this hospitalization (including imaging, microbiology, ancillary and laboratory) are listed below for reference.     Microbiology: Recent Results (from the past 240 hour(s))  Resp Panel by RT-PCR (Flu A&B, Covid) Nasopharyngeal Swab     Status: None   Collection Time: 08/25/21  9:50 AM   Specimen: Nasopharyngeal Swab; Nasopharyngeal(NP) swabs in vial transport medium  Result Value Ref Range Status   SARS Coronavirus 2 by RT PCR NEGATIVE NEGATIVE Final    Comment: (NOTE) SARS-CoV-2 target nucleic acids are NOT DETECTED.  The SARS-CoV-2 RNA is generally detectable in upper respiratory specimens during the acute phase of infection. The lowest concentration of SARS-CoV-2 viral copies this assay can detect is 138 copies/mL. A negative result does not preclude SARS-Cov-2 infection and should not be used as the sole basis for treatment or other patient management decisions. A negative  result may occur with  improper specimen collection/handling, submission of specimen other than nasopharyngeal swab, presence of viral mutation(s) within the areas targeted by this assay, and inadequate number of viral copies(<138 copies/mL). A negative result must be combined with clinical observations, patient history, and epidemiological information. The expected result is Negative.  Fact Sheet for Patients:  EntrepreneurPulse.com.au  Fact Sheet for Healthcare Providers:  IncredibleEmployment.be  This test is no t yet approved or cleared by the [place] FDA and  has been authorized for detection and/or diagnosis of SARS-CoV-2 by FDA under an Emergency Use Authorization (EUA). This EUA will remain  in effect (meaning this test can be used) for the duration of the COVID-19 declaration under Section 564(b)(1) of the Act, 21 U.S.C.section 360bbb-3(b)(1), unless the authorization is terminated  or revoked sooner.       Influenza A by PCR NEGATIVE NEGATIVE Final   Influenza B by PCR NEGATIVE NEGATIVE Final    Comment: (NOTE) The Xpert Xpress SARS-CoV-2/FLU/RSV plus assay is intended as an aid in the diagnosis of influenza from Nasopharyngeal swab specimens and should not be used as a sole basis for treatment. Nasal washings and aspirates are unacceptable for Xpert Xpress SARS-CoV-2/FLU/RSV testing.  Fact Sheet for Patients: EntrepreneurPulse.com.au  Fact Sheet for Healthcare Providers: IncredibleEmployment.be  This test is not yet approved or cleared by the [place] FDA and has been authorized for detection and/or diagnosis of SARS-CoV-2 by FDA under an Emergency Use Authorization (EUA). This EUA will remain in effect (meaning this test can be used) for the duration of the COVID-19 declaration under Section 564(b)(1) of the Act, 21 U.S.C. section 360bbb-3(b)(1), unless the authorization is  terminated or revoked.  Performed at [facility], [place] [address], [place], [place] [address]   Blood culture (routine x 2)     Status: None   Collection Time: 08/25/21 12:55 PM   Specimen: BLOOD  Result Value Ref Range Status   Specimen Description   Final    BLOOD BLOOD RIGHT HAND Performed at [place] [address], [place], [place] [address]    Special Requests   Final    BOTTLES DRAWN AEROBIC AND ANAEROBIC Blood Culture results may not be optimal due to an inadequate volume of blood received in culture bottles Performed at  [facility], [place] [address] [place], [place] [address]    Culture   Final    NO GROWTH 5 DAYS Performed at [facility], [place] [address], [place], [place] [address]    Report Status 08/30/2021 FINAL  Final  Blood culture (routine x 2)     Status: None   Collection Time: 08/25/21  1:03 PM   Specimen: BLOOD  Result Value Ref Range Status   Specimen Description   Final    BLOOD BLOOD RIGHT FOREARM Performed at [place] [address], [place], [place] [address]    Special Requests   Final    BOTTLES DRAWN AEROBIC AND ANAEROBIC Blood Culture adequate volume Performed at [facility] [address], [place], [place] [address]    Culture   Final    NO GROWTH 5 DAYS Performed at [facility], [place] [address] [place], [place] [address]    Report Status 08/30/2021 FINAL  Final  MRSA Next Gen by PCR, Nasal     Status: None   Collection Time: 08/26/21  5:02 PM   Specimen: Nasal Mucosa; Nasal Swab  Result Value Ref Range Status   MRSA by PCR Next Gen NOT DETECTED NOT DETECTED Final    Comment: (NOTE) The GeneXpert MRSA Assay (FDA approved for NASAL specimens only), is one component of a comprehensive MRSA colonization surveillance program. It is not intended to diagnose MRSA infection nor to guide or monitor treatment for MRSA  infections. Test performance is not FDA approved in patients less than 77 years old. Performed at [facility], [place] [address], [place],  [address]      Labs: BNP (last 3 results) Recent Labs    08/25/21 0948  BNP 57.8   Basic Metabolic Panel: Recent Labs  Lab 08/25/21 0943 08/26/21 0341 08/27/21 0301 08/28/21 0316  NA 137 144 141 142  K 3.5 3.2* 3.6 3.6  CL 106 103 105 104  CO2 26 28 27 29   GLUCOSE 106* 113* 124* 104*  BUN 25* 29* 25* 22  CREATININE 1.37* 1.19 1.14 0.99  CALCIUM 8.4* 8.4* 8.7* 8.5*  MG 1.8 2.0 2.2  --   PHOS  --  3.5 3.4  --    Liver Function Tests: Recent Labs  Lab 08/25/21 0943 08/26/21 0341  AST 27 19  ALT 33 26  ALKPHOS 230* 185*  BILITOT 0.9 0.8  PROT 6.1* 5.7*  ALBUMIN 2.9* 2.5*   No results for input(s): LIPASE, AMYLASE in the last 168 hours. No results for input(s): AMMONIA in the last 168 hours. CBC: Recent Labs  Lab 08/25/21 0943 08/26/21 0341 08/27/21 0301 08/28/21 0316 08/30/21 0455  WBC 12.3* 7.2 8.9 6.6 9.6  NEUTROABS 11.5*  --   --   --   --   HGB 13.1 11.8* 11.3* 10.3* 10.6*  HCT 40.3 36.5* 34.6* 32.0* 33.3*  MCV 96.9 96.8 97.2 97.0 97.1  PLT 279 273 291 237 299   Cardiac Enzymes: No results for input(s): CKTOTAL, CKMB, CKMBINDEX, TROPONINI in the last 168 hours. BNP: Invalid input(s): POCBNP CBG: No results for input(s): GLUCAP in the last 168 hours. D-Dimer No results for input(s): DDIMER in the last 72 hours. Hgb A1c No results for input(s): HGBA1C in the last 72 hours. Lipid Profile No results for input(s): CHOL, HDL, LDLCALC, TRIG, CHOLHDL, LDLDIRECT in the last 72 hours. Thyroid function studies No results for input(s): TSH, T4TOTAL, T3FREE, THYROIDAB in  the last 72 hours.  Invalid input(s): FREET3 Anemia work up No results for input(s): VITAMINB12, FOLATE, FERRITIN, TIBC, IRON, RETICCTPCT in the last 72 hours. Urinalysis    Component Value Date/Time   COLORURINE YELLOW  03/14/2021 1537   APPEARANCEUR CLEAR 03/14/2021 1537   LABSPEC 1.020 03/14/2021 1537   PHURINE 5.0 03/14/2021 1537   GLUCOSEU NEGATIVE 03/14/2021 1537   HGBUR MODERATE (A) 03/14/2021 1537   BILIRUBINUR NEGATIVE 03/14/2021 1537   BILIRUBINUR neg 08/30/2018 1107   KETONESUR NEGATIVE 03/14/2021 1537   PROTEINUR NEGATIVE 03/14/2021 1537   UROBILINOGEN 0.2 08/30/2018 1107   NITRITE NEGATIVE 03/14/2021 1537   LEUKOCYTESUR NEGATIVE 03/14/2021 1537   Sepsis Labs Invalid input(s): PROCALCITONIN,  WBC,  LACTICIDVEN Microbiology Recent Results (from the past 240 hour(s))  Resp Panel by RT-PCR (Flu A&B, Covid) Nasopharyngeal Swab     Status: None   Collection Time: 08/25/21  9:50 AM   Specimen: Nasopharyngeal Swab; Nasopharyngeal(NP) swabs in vial transport medium  Result Value Ref Range Status   SARS Coronavirus 2 by RT PCR NEGATIVE NEGATIVE Final    Comment: (NOTE) SARS-CoV-2 target nucleic acids are NOT DETECTED.  The SARS-CoV-2 RNA is generally detectable in upper respiratory specimens during the acute phase of infection. The lowest concentration of SARS-CoV-2 viral copies this assay can detect is 138 copies/mL. A negative result does not preclude SARS-Cov-2 infection and should not be used as the sole basis for treatment or other patient management decisions. A negative result may occur with  improper specimen collection/handling, submission of specimen other than nasopharyngeal swab, presence of viral mutation(s) within the areas targeted by this assay, and inadequate number of viral copies(<138 copies/mL). A negative result must be combined with clinical observations, patient history, and epidemiological information. The expected result is Negative.  Fact Sheet for Patients:  EntrepreneurPulse.com.au  Fact Sheet for Healthcare Providers:  IncredibleEmployment.be  This test is no t yet approved or cleared by the [place] FDA and  has  been authorized for detection and/or diagnosis of SARS-CoV-2 by FDA under an Emergency Use Authorization (EUA). This EUA will remain  in effect (meaning this test can be used) for the duration of the COVID-19 declaration under Section 564(b)(1) of the Act, 21 U.S.C.section 360bbb-3(b)(1), unless the authorization is terminated  or revoked sooner.       Influenza A by PCR NEGATIVE NEGATIVE Final   Influenza B by PCR NEGATIVE NEGATIVE Final    Comment: (NOTE) The Xpert Xpress SARS-CoV-2/FLU/RSV plus assay is intended as an aid in the diagnosis of influenza from Nasopharyngeal swab specimens and should not be used as a sole basis for treatment. Nasal washings and aspirates are unacceptable for Xpert Xpress SARS-CoV-2/FLU/RSV testing.  Fact Sheet for Patients: EntrepreneurPulse.com.au  Fact Sheet for Healthcare Providers: IncredibleEmployment.be  This test is not yet approved or cleared by the [place] FDA and has been authorized for detection and/or diagnosis of SARS-CoV-2 by FDA under an Emergency Use Authorization (EUA). This EUA will remain in effect (meaning this test can be used) for the duration of the COVID-19 declaration under Section 564(b)(1) of the Act, 21 U.S.C. section 360bbb-3(b)(1), unless the authorization is terminated or revoked.  Performed at [facility], [place] [address], [place], [place] [address]   Blood culture (routine x 2)     Status: None   Collection Time: 08/25/21 12:55 PM   Specimen: BLOOD  Result Value Ref Range Status   Specimen Description   Final    BLOOD BLOOD RIGHT  HAND Performed at [facility], [place] [address], [place], [place] [address]    Special Requests   Final    BOTTLES DRAWN AEROBIC AND ANAEROBIC Blood Culture results may not be optimal due to an inadequate volume of blood received in culture bottles Performed at [facility]  [address], [place], [place] [address]    Culture   Final    NO GROWTH 5 DAYS Performed at [facility], [place] [address] [place], [place] [address]    Report Status 08/30/2021 FINAL  Final  Blood culture (routine x 2)     Status: None   Collection Time: 08/25/21  1:03 PM   Specimen: BLOOD  Result Value Ref Range Status   Specimen Description   Final    BLOOD BLOOD RIGHT FOREARM Performed at [facility] [address], [place], [place] [address]    Special Requests   Final    BOTTLES DRAWN AEROBIC AND ANAEROBIC Blood Culture adequate volume Performed at [facility] [address] [place], [place] [address]    Culture   Final    NO GROWTH 5 DAYS Performed at [facility], [place] [address], [place], [place] [address]    Report Status 08/30/2021 FINAL  Final  MRSA Next Gen by PCR, Nasal     Status: None   Collection Time: 08/26/21  5:02 PM   Specimen: Nasal Mucosa; Nasal Swab  Result Value Ref Range Status   MRSA by PCR Next Gen NOT DETECTED NOT DETECTED Final    Comment: (NOTE) The GeneXpert MRSA Assay (FDA approved for NASAL specimens only), is one component of a comprehensive MRSA colonization surveillance program. It is not intended to diagnose MRSA infection nor to guide or monitor treatment for MRSA infections. Test performance is not FDA approved in patients less than 59 years old. Performed at [facility], [place] [address] [place], [place] [address]      Time coordinating discharge: Over 30 minutes  SIGNED:   Shawna Clamp, MD  [facility] 08/30/2021, 3:10 PM Pager   If 7PM-7AM, please contact night-coverage

## 2021-08-30 NOTE — Discharge Instructions (Signed)
Advised to follow-up with primary care physician in 1 week. Advised to follow-up with pulmonology Dr. Valeta Harms in 1 week for Pleurx catheter for hospice. Advised to hold Eliquis for anticipated Pleurx catheter on Friday. Patient is being discharged home with home hospice.

## 2021-08-30 NOTE — TOC Transition Note (Signed)
Transition of Care [facility]) - CM/SW Discharge Note   Patient Details  Name: Ethan Brown MRN: [id number] Date of Birth: 05-Aug-1951  Transition of Care [facility] CM/SW Contact:  Illene Regulus, LCSW Phone Number: 08/30/2021, 1:09 PM   Clinical Narrative:    CSW spoke with pt's spouse to confirmed time of transport, pt's wife agreed for 4pm scheduled ems transport. CSW scheduled transport for 4pm.  TOC CSW sign off.         Patient Goals and CMS Choice        Discharge Placement                       Discharge Plan and Services                                     Social Determinants of Health (SDOH) Interventions     Readmission Risk Interventions Readmission Risk Prevention Plan 08/28/2021 03/30/2019  Post Dischage Appt - Complete  Medication Screening - Complete  Transportation Screening Complete Complete  PCP or Specialist Appt within 3-5 Days Complete -  HRI or Home Care Consult Complete -  Social Work Consult for Recovery Care Planning/Counseling Complete -  Palliative Care Screening Complete -  Medication Review [employer] photographer) Complete -  Some recent data might be hidden

## 2021-08-30 NOTE — Telephone Encounter (Signed)
PCCM:  Asked to place IPC while inpatient with plans for discharge on home hospice care. He had 1200cc fluid removed and now improve with thora. He is on eliquis which will need to be held prior to consideration for IPC placement today. But now improved with removal of fluid.   We can consider outpatient IPC or consideration for thoracentesis in the office. I will talk with Dr. Hilma Favors after further discussions with family.   Garner Nash, DO [facility] 08/30/2021 8:17 AM

## 2021-08-30 NOTE — Progress Notes (Signed)
Request received for left pleural PleurX catheter placement.   Case approved by Dr. Annamaria Boots.  Patient just had thoracentesis on 1/5 and not sure if he will have enough pleural effusion in his left lung for a PleurX placement.   IR will plan for PleurX catheter placement early next week. Patient may need CXR prior to the procedure to ensure adequate amount of pleural effusion is in the left lung.   Formal consult to follow.   Armando Gang Sita Mangen PA-C 08/30/2021 10:32 AM

## 2021-08-30 NOTE — Progress Notes (Signed)
Palliative Care Progress Note  [name] is doing much better after having thoracentesis yesterday but he remains very weak and continues to have intermittent confusion (worse in the evenings). I had a discussion with him and his wife at bedside to discuss a care plan moving forward.   Metastatic Lung Cancer. Admitted with respiratory failure, possible PNA but more likely tumor progression causing obstruction and large bilateral pleural effusions. He is not felt to be strong enough to undergo additional chemotherapy and Dr. Tammi Klippel has reviewed his scans but does not think radiation given his current functional status would achieve significant improvement in his symptoms.  Based on disease progression and limited tx options he is clearly Hospice Eligible. I discussed hospice services with them in detail. They are agreeable. Recommended consideration of Pleurx placement so we can manage his malignant effusions at home. For his dyspnea will use the following Roxanol 10-20mg  SL q2 PRN Budesonide nebulizers BID Duonebs PRN Decadron 4-8mg  PO daily-may divide doses. He can continue Eloquis as long as he is able to take oral medications. Would recommend transitioning him to an oral abx for post-obstructive PNA-Azithromycin daily would likely be sufficient and also help with airway inflammation. Delirium I suspect this is mostly hospital induced and he has sundowning features-it has been manageable but he will likely improve in the home setting.  Will use oral Valium PRN-he did well with this in hospital.  Prognosis: He has progressive lung cancer, recurring effusions, obstruction and low albumin, frailty. I discussed the prognostic uncertainty with them. While we hope for as much quality time as possible, there is a possibility we could only be looking at a few weeks at best. I have offered to do a phone conference to include his other family members.  Discharge Planning: Home with Hospice Services  ASAP-significant DME needs-will need O2, Nebulizer , bed, wc, and if pleurx is placed supplies for this.  Lane Hacker, DO Palliative Medicine

## 2021-08-31 DIAGNOSIS — Z8669 Personal history of other diseases of the nervous system and sense organs: Secondary | ICD-10-CM | POA: Diagnosis not present

## 2021-08-31 DIAGNOSIS — J9601 Acute respiratory failure with hypoxia: Secondary | ICD-10-CM | POA: Diagnosis not present

## 2021-08-31 DIAGNOSIS — C7931 Secondary malignant neoplasm of brain: Secondary | ICD-10-CM | POA: Diagnosis not present

## 2021-08-31 DIAGNOSIS — R634 Abnormal weight loss: Secondary | ICD-10-CM | POA: Diagnosis not present

## 2021-08-31 DIAGNOSIS — J984 Other disorders of lung: Secondary | ICD-10-CM | POA: Diagnosis not present

## 2021-08-31 DIAGNOSIS — C787 Secondary malignant neoplasm of liver and intrahepatic bile duct: Secondary | ICD-10-CM | POA: Diagnosis not present

## 2021-08-31 DIAGNOSIS — H04129 Dry eye syndrome of unspecified lacrimal gland: Secondary | ICD-10-CM | POA: Diagnosis not present

## 2021-08-31 DIAGNOSIS — R410 Disorientation, unspecified: Secondary | ICD-10-CM | POA: Diagnosis not present

## 2021-08-31 DIAGNOSIS — E785 Hyperlipidemia, unspecified: Secondary | ICD-10-CM | POA: Diagnosis not present

## 2021-08-31 DIAGNOSIS — Z6823 Body mass index (BMI) 23.0-23.9, adult: Secondary | ICD-10-CM | POA: Diagnosis not present

## 2021-08-31 DIAGNOSIS — L89151 Pressure ulcer of sacral region, stage 1: Secondary | ICD-10-CM | POA: Diagnosis not present

## 2021-08-31 DIAGNOSIS — I82409 Acute embolism and thrombosis of unspecified deep veins of unspecified lower extremity: Secondary | ICD-10-CM | POA: Diagnosis not present

## 2021-08-31 DIAGNOSIS — E46 Unspecified protein-calorie malnutrition: Secondary | ICD-10-CM | POA: Diagnosis not present

## 2021-08-31 DIAGNOSIS — C7951 Secondary malignant neoplasm of bone: Secondary | ICD-10-CM | POA: Diagnosis not present

## 2021-08-31 DIAGNOSIS — Z9889 Other specified postprocedural states: Secondary | ICD-10-CM | POA: Diagnosis not present

## 2021-08-31 DIAGNOSIS — D630 Anemia in neoplastic disease: Secondary | ICD-10-CM | POA: Diagnosis not present

## 2021-08-31 DIAGNOSIS — J910 Malignant pleural effusion: Secondary | ICD-10-CM | POA: Diagnosis not present

## 2021-08-31 DIAGNOSIS — C3491 Malignant neoplasm of unspecified part of right bronchus or lung: Secondary | ICD-10-CM | POA: Diagnosis not present

## 2021-09-01 DIAGNOSIS — J984 Other disorders of lung: Secondary | ICD-10-CM | POA: Diagnosis not present

## 2021-09-01 DIAGNOSIS — J910 Malignant pleural effusion: Secondary | ICD-10-CM | POA: Diagnosis not present

## 2021-09-01 DIAGNOSIS — C787 Secondary malignant neoplasm of liver and intrahepatic bile duct: Secondary | ICD-10-CM | POA: Diagnosis not present

## 2021-09-01 DIAGNOSIS — J9601 Acute respiratory failure with hypoxia: Secondary | ICD-10-CM | POA: Diagnosis not present

## 2021-09-01 DIAGNOSIS — C7951 Secondary malignant neoplasm of bone: Secondary | ICD-10-CM | POA: Diagnosis not present

## 2021-09-01 DIAGNOSIS — C3491 Malignant neoplasm of unspecified part of right bronchus or lung: Secondary | ICD-10-CM | POA: Diagnosis not present

## 2021-09-01 NOTE — Telephone Encounter (Signed)
There is nothing that we need to do with this message right now correct? We are waiting for further updates after to talk with family right?

## 2021-09-02 ENCOUNTER — Other Ambulatory Visit (HOSPITAL_COMMUNITY): Payer: Self-pay

## 2021-09-02 DIAGNOSIS — J910 Malignant pleural effusion: Secondary | ICD-10-CM | POA: Diagnosis not present

## 2021-09-02 DIAGNOSIS — J9601 Acute respiratory failure with hypoxia: Secondary | ICD-10-CM | POA: Diagnosis not present

## 2021-09-02 DIAGNOSIS — C7951 Secondary malignant neoplasm of bone: Secondary | ICD-10-CM | POA: Diagnosis not present

## 2021-09-02 DIAGNOSIS — C787 Secondary malignant neoplasm of liver and intrahepatic bile duct: Secondary | ICD-10-CM | POA: Diagnosis not present

## 2021-09-02 DIAGNOSIS — J984 Other disorders of lung: Secondary | ICD-10-CM | POA: Diagnosis not present

## 2021-09-02 DIAGNOSIS — C3491 Malignant neoplasm of unspecified part of right bronchus or lung: Secondary | ICD-10-CM | POA: Diagnosis not present

## 2021-09-03 DIAGNOSIS — J9601 Acute respiratory failure with hypoxia: Secondary | ICD-10-CM | POA: Diagnosis not present

## 2021-09-03 DIAGNOSIS — J910 Malignant pleural effusion: Secondary | ICD-10-CM | POA: Diagnosis not present

## 2021-09-03 DIAGNOSIS — C7951 Secondary malignant neoplasm of bone: Secondary | ICD-10-CM | POA: Diagnosis not present

## 2021-09-03 DIAGNOSIS — J984 Other disorders of lung: Secondary | ICD-10-CM | POA: Diagnosis not present

## 2021-09-03 DIAGNOSIS — C787 Secondary malignant neoplasm of liver and intrahepatic bile duct: Secondary | ICD-10-CM | POA: Diagnosis not present

## 2021-09-03 DIAGNOSIS — C3491 Malignant neoplasm of unspecified part of right bronchus or lung: Secondary | ICD-10-CM | POA: Diagnosis not present

## 2021-09-04 NOTE — Telephone Encounter (Signed)
I mailed the card yesterday and used the stamp for you and [place]. Also wrote praying for you and your family during this time Dr. Valeta Harms, Dr. Lamonte Sakai and staff

## 2021-09-10 ENCOUNTER — Other Ambulatory Visit (HOSPITAL_COMMUNITY): Payer: Self-pay

## 2021-09-24 NOTE — Telephone Encounter (Signed)
FYI this patient has passed away. Condolence card has been mailed form the office. Nothing further needed at this time.

## 2021-09-24 DEATH — deceased

## 2021-09-25 ENCOUNTER — Ambulatory Visit: Payer: Self-pay | Admitting: Urology

## 2021-09-28 NOTE — Progress Notes (Signed)
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     Indication for treatment:  Palliation       Radiation treatment dates:   07/22/21  Site/dose/beams/energy:    Right temporal 5 mm target was treated using 3 Dynamic Conformal Arcs to a prescription dose of 20 Gy.  ExacTrac registration was performed for each couch angle.  The 100% isodose line was prescribed.  6 MV X-rays were delivered in the flattening filter free beam mode. Left left ventricle 2 mm target was treated using 3 Dynamic Conformal Arcs to a prescription dose of 20 Gy.  ExacTrac registration was performed for each couch angle.  The 100% isodose line was prescribed.  6 MV X-rays were delivered in the flattening filter free beam mode.  Narrative: The patient tolerated radiation treatment relatively well.     Plan: The patient has completed radiation treatment. The patient will return to radiation oncology clinic for routine followup in one month. I advised him to call or return sooner if he has any questions or concerns related to his recovery or treatment. ________________________________  Sheral Apley. Tammi Klippel, M.D.

## 2021-11-04 ENCOUNTER — Ambulatory Visit: Payer: Medicare Other | Admitting: Student

## 2021-11-19 ENCOUNTER — Other Ambulatory Visit: Payer: Self-pay

## 2021-11-20 ENCOUNTER — Other Ambulatory Visit (HOSPITAL_COMMUNITY): Payer: Self-pay

## 2022-12-04 IMAGING — MR MR HEAD WO/W CM
14 series · 48 of 48 positions shown · IV contrast (with contrast)
Comparison: 06/20/2020

CLINICAL DATA: Metastatic lung cancer. Multiple brain metastases
treated with SRS, most recently right temporal and left
periventricular lesions treated on 07/03/2020.

EXAM:
MRI HEAD WITHOUT AND WITH CONTRAST
TECHNIQUE: Multiplanar, multiecho pulse sequences of the brain and surrounding
structures were obtained without and with intravenous contrast.
CONTRAST:  18mL MULTIHANCE GADOBENATE DIMEGLUMINE 529 MG/ML IV SOLN

[Series 2: FLAIR · sagittal · 3.0mm · 0.94mm/px · 1 of 40 slices shown (1 of 2)]
[im 1/40]
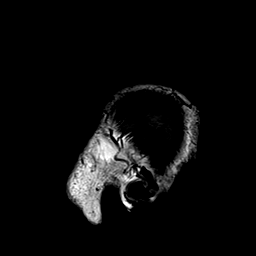

[Series 3: DWI · axial · 3.0mm · 1.50mm/px · z∈[-113,+50]mm · 3 of 86 slices shown (1 of 2)]
[im 1/86]
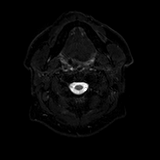
[im 43/86]
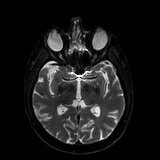
[im 86/86]
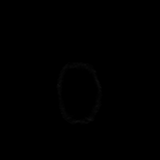

[Series 4: DWI · axial · 3.0mm · 1.50mm/px · z∈[-113,+50]mm · 2 of 43 slices shown (2 of 2)]
[im 1/43]
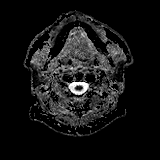
[im 43/43]
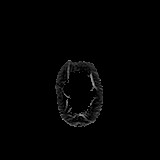

[Series 5: T2 · axial · 5.0mm · 0.86mm/px · 1 of 28 slices shown (1 of 3)]
[im 1/28]
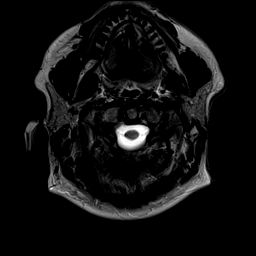

[Series 7: swi_images · axial · 1.5mm · 0.98mm/px · z∈[-101,+66]mm · 4 of 112 slices shown]
[im 1/112]
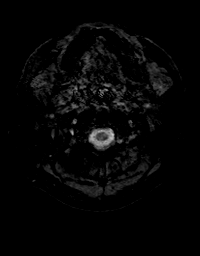
[im 38/112]
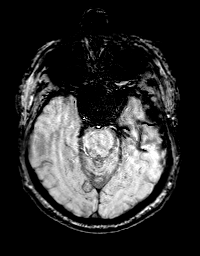
[im 75/112]
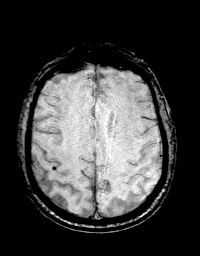
[im 112/112]
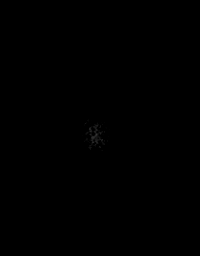

[Series 8: FLAIR · axial · 3.0mm · 1.30mm/px · z∈[-107,+67]mm · 2 of 59 slices shown (2 of 2)]
[im 1/59]
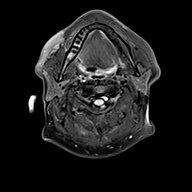
[im 59/59]
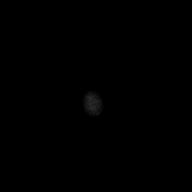

[Series 9: T2 · axial · 5.0mm · 0.98mm/px · 1 of 28 slices shown (2 of 3)]
[im 1/28]
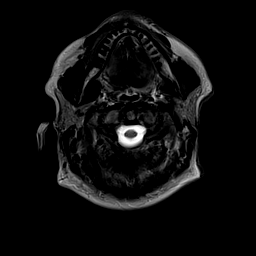

[Series 10: T2 · axial · non-contrast · 1.0mm · 0.98mm/px · z∈[-102,+69]mm · 7 of 176 slices shown (3 of 3)]
[im 1/176]
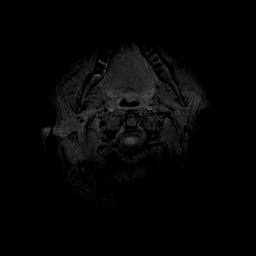
[im 30/176]
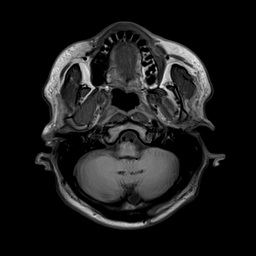
[im 59/176]
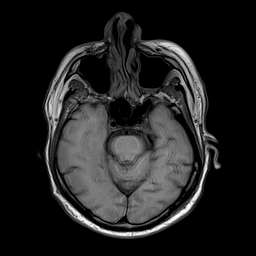
[im 88/176]
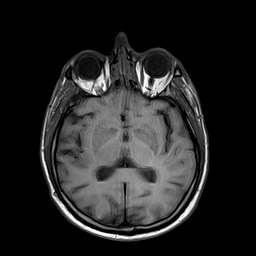
[im 117/176]
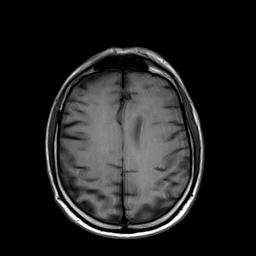
[im 146/176]
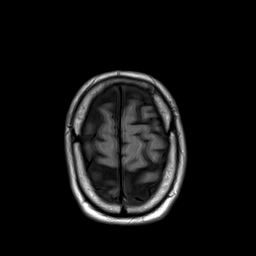
[im 176/176]
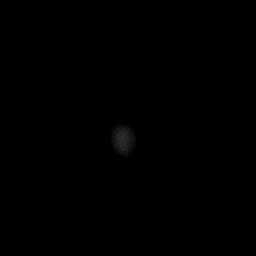

[Series 11: T1 · axial · non-contrast · 1.0mm · 0.78mm/px · z∈[-113,+62]mm · 7 of 176 slices shown]
[im 1/176]
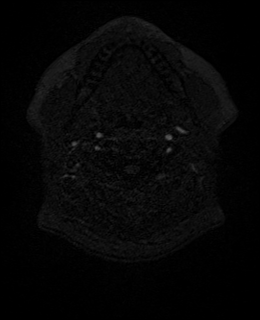
[im 30/176]
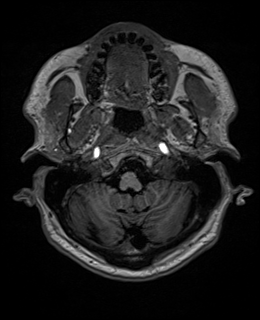
[im 59/176]
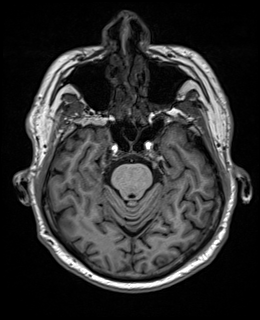
[im 88/176]
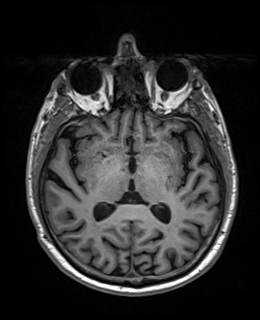
[im 117/176]
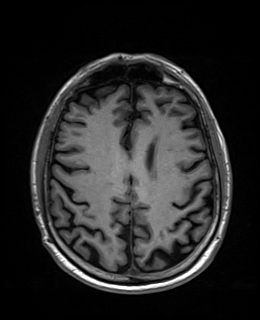
[im 146/176]
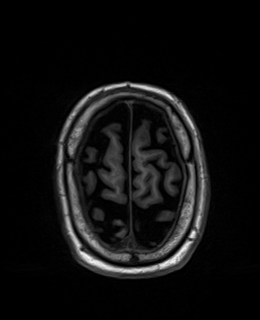
[im 176/176]
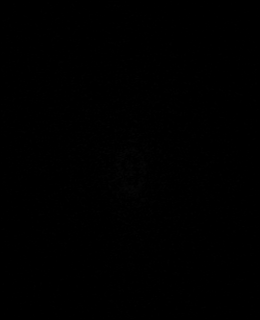

[Series 12: T2 post-contrast · coronal · 3.0mm · 0.57mm/px · 2 of 45 slices shown (1 of 2)]
[im 1/45]
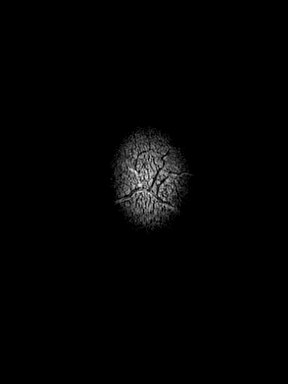
[im 45/45]
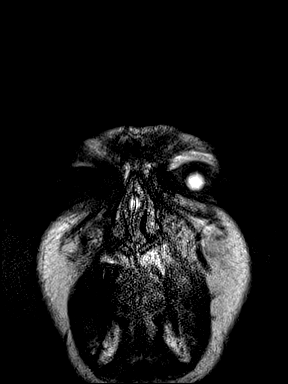

[Series 13: T2 post-contrast · axial · 1.0mm · 0.98mm/px · z∈[-102,+69]mm · 7 of 176 slices shown (2 of 2)]
[im 1/176]
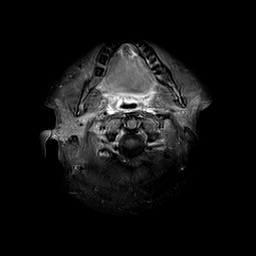
[im 30/176]
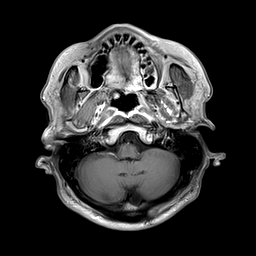
[im 59/176]
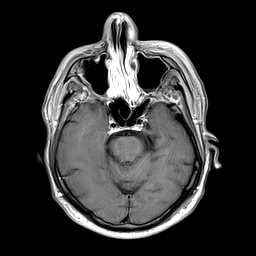
[im 88/176]
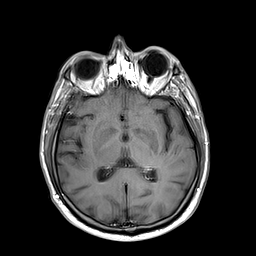
[im 117/176]
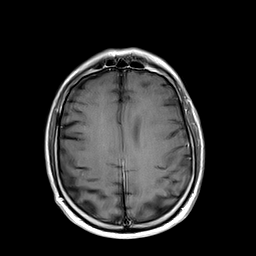
[im 146/176]
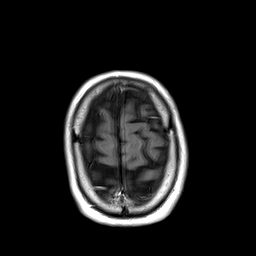
[im 176/176]
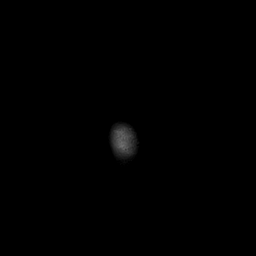

[Series 14: T1 post-contrast · axial · 1.0mm · 0.78mm/px · z∈[-113,+62]mm · 7 of 176 slices shown (1 of 2)]
[im 1/176]
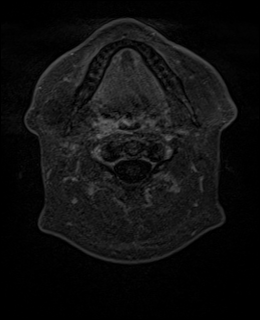
[im 30/176]
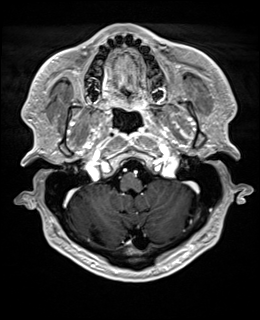
[im 59/176]
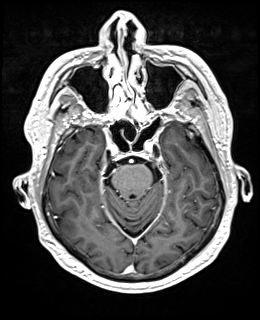
[im 88/176]
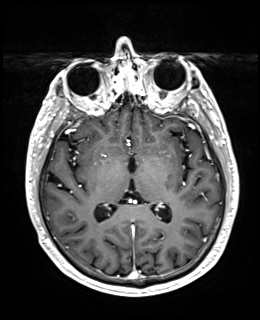
[im 117/176]
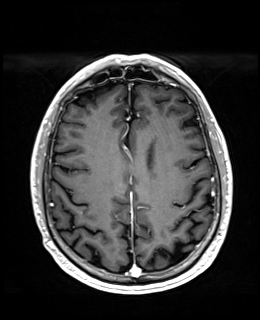
[im 146/176]
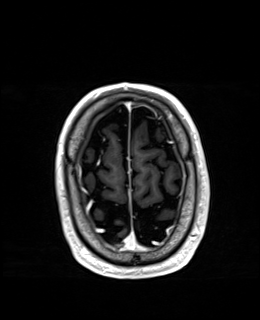
[im 176/176]
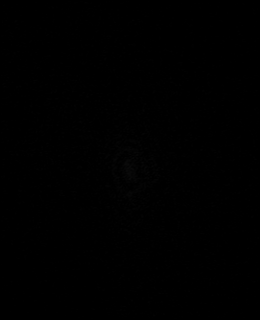

[Series 15: T1 post-contrast · coronal · 3.0mm · 0.69mm/px · 2 of 47 slices shown (2 of 2)]
[im 1/47]
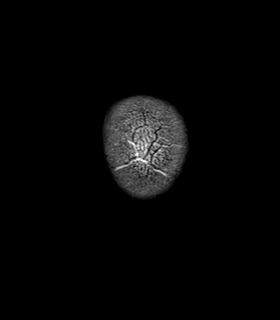
[im 47/47]
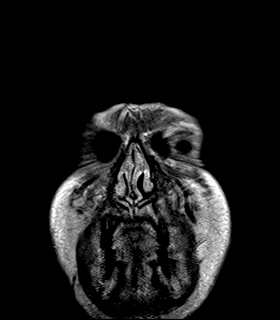

[Series 16: FLAIR post-contrast · sagittal · 3.0mm · 0.94mm/px · 2 of 40 slices shown]
[im 1/40]
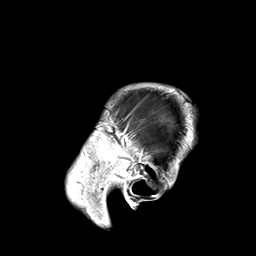
[im 40/40]
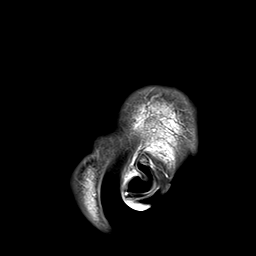

[48 of 48 positions shown; findings below may reference images not displayed]

FINDINGS: Pre- and postcontrast axial T1 SPACE sequences are mildly motion
degraded.

BRAIN

New Lesions:

1. 2 mm focus of enhancement in the medial left frontal lobe which
appears more rounded and discrete on coronal and sagittal images
(series 15, image 32 and series 16, image 22) but less well-defined
and possibly vascular on axial images (series 13, image 133 and
series 14, image 135).

Larger lesions: None.

Stable or Smaller lesions:

1. 2 mm enhancing right temporal lesion, decreased in size (series
13, image 80 and series 14, image 84). Associated new hemosiderin
deposition.
2. Resolution of the enhancing periventricular lesion adjacent to
the body of the left lateral ventricle.
3. 2 mm enhancing lesion in the right postcentral gyrus with
associated hemosiderin, unchanged (series 13, image 116 and series
14, image 118).
4. 1 mm focus of subtle enhancement at the site of the treated right
thalamocapsular lesion (series 14, image 91).

Other Brain findings: No acute infarct, midline shift, or
extra-axial fluid collection is identified. A chronic
microhemorrhage in the left parietal lobe is unchanged. Scattered
small foci of T2 hyperintensity in the cerebral white matter
bilaterally are unchanged and nonspecific but compatible with mild
chronic small vessel ischemic disease. The ventricles and sulci are
within normal limits for age.

Vascular: Major intracranial vascular flow voids are preserved.

Skull and upper cervical spine: Unremarkable bone marrow signal.

Sinuses/Orbits: Bilateral cataract extraction. Scattered mucosal
thickening in the paranasal sinuses, moderate in the ethmoid air
cells bilaterally. Small bilateral mastoid effusions.

Other: None.
IMPRESSION: 1. New 2 mm focus of enhancement in the medial left frontal lobe,
indeterminate for a metastasis versus vascular enhancement.
Short-term follow-up is recommended.
2. Decreased size of the intervally treated right temporal and left
periventricular lesions.
3. Unchanged other treated lesions.

## 2023-07-14 NOTE — Telephone Encounter (Signed)
Telephone call
